# Patient Record
Sex: Female | Born: 1940
Health system: Southern US, Community
[De-identification: ages and names within clinical notes are randomized; demographics above are authoritative.]

## PROBLEM LIST (undated history)

## (undated) DIAGNOSIS — I82409 Acute embolism and thrombosis of unspecified deep veins of unspecified lower extremity: Secondary | ICD-10-CM

## (undated) DIAGNOSIS — Z6841 Body Mass Index (BMI) 40.0 and over, adult: Secondary | ICD-10-CM

## (undated) DIAGNOSIS — K219 Gastro-esophageal reflux disease without esophagitis: Secondary | ICD-10-CM

## (undated) DIAGNOSIS — I2119 ST elevation (STEMI) myocardial infarction involving other coronary artery of inferior wall: Secondary | ICD-10-CM

## (undated) DIAGNOSIS — D649 Anemia, unspecified: Secondary | ICD-10-CM

## (undated) DIAGNOSIS — R7303 Prediabetes: Secondary | ICD-10-CM

## (undated) DIAGNOSIS — I440 Atrioventricular block, first degree: Secondary | ICD-10-CM

## (undated) DIAGNOSIS — E669 Obesity, unspecified: Secondary | ICD-10-CM

## (undated) DIAGNOSIS — R42 Dizziness and giddiness: Secondary | ICD-10-CM

## (undated) DIAGNOSIS — R55 Syncope and collapse: Secondary | ICD-10-CM

## (undated) DIAGNOSIS — Z23 Encounter for immunization: Secondary | ICD-10-CM

## (undated) DIAGNOSIS — R0789 Other chest pain: Secondary | ICD-10-CM

## (undated) DIAGNOSIS — G4733 Obstructive sleep apnea (adult) (pediatric): Secondary | ICD-10-CM

## (undated) DIAGNOSIS — R413 Other amnesia: Secondary | ICD-10-CM

## (undated) DIAGNOSIS — N390 Urinary tract infection, site not specified: Secondary | ICD-10-CM

## (undated) DIAGNOSIS — K922 Gastrointestinal hemorrhage, unspecified: Secondary | ICD-10-CM

## (undated) DIAGNOSIS — G4700 Insomnia, unspecified: Secondary | ICD-10-CM

## (undated) DIAGNOSIS — R911 Solitary pulmonary nodule: Secondary | ICD-10-CM

## (undated) DIAGNOSIS — R9389 Abnormal findings on diagnostic imaging of other specified body structures: Secondary | ICD-10-CM

## (undated) DIAGNOSIS — E785 Hyperlipidemia, unspecified: Secondary | ICD-10-CM

## (undated) DIAGNOSIS — R079 Chest pain, unspecified: Secondary | ICD-10-CM

## (undated) DIAGNOSIS — I453 Trifascicular block: Secondary | ICD-10-CM

## (undated) DIAGNOSIS — I2510 Atherosclerotic heart disease of native coronary artery without angina pectoris: Secondary | ICD-10-CM

## (undated) DIAGNOSIS — K259 Gastric ulcer, unspecified as acute or chronic, without hemorrhage or perforation: Secondary | ICD-10-CM

## (undated) DIAGNOSIS — C541 Malignant neoplasm of endometrium: Secondary | ICD-10-CM

## (undated) DIAGNOSIS — I639 Cerebral infarction, unspecified: Secondary | ICD-10-CM

## (undated) DIAGNOSIS — I77810 Thoracic aortic ectasia: Secondary | ICD-10-CM

## (undated) DIAGNOSIS — K649 Unspecified hemorrhoids: Secondary | ICD-10-CM

## (undated) DIAGNOSIS — IMO0002 Reserved for concepts with insufficient information to code with codable children: Secondary | ICD-10-CM

## (undated) DIAGNOSIS — G459 Transient cerebral ischemic attack, unspecified: Secondary | ICD-10-CM

## (undated) DIAGNOSIS — I10 Essential (primary) hypertension: Secondary | ICD-10-CM

## (undated) DIAGNOSIS — Z5181 Encounter for therapeutic drug level monitoring: Secondary | ICD-10-CM

## (undated) DIAGNOSIS — Z8673 Personal history of transient ischemic attack (TIA), and cerebral infarction without residual deficits: Secondary | ICD-10-CM

## (undated) DIAGNOSIS — R739 Hyperglycemia, unspecified: Secondary | ICD-10-CM

## (undated) HISTORY — DX: Other chest pain: R07.89

## (undated) HISTORY — DX: Reserved for concepts with insufficient information to code with codable children: IMO0002

## (undated) HISTORY — DX: Morbid (severe) obesity due to excess calories: E66.01

## (undated) HISTORY — DX: Unspecified hemorrhoids: K64.9

## (undated) HISTORY — DX: Thoracic aortic ectasia: I77.810

## (undated) HISTORY — DX: Other amnesia: R41.3

## (undated) HISTORY — DX: Encounter for therapeutic drug level monitoring: Z51.81

## (undated) HISTORY — DX: Insomnia, unspecified: G47.00

## (undated) HISTORY — DX: Solitary pulmonary nodule: R91.1

## (undated) HISTORY — DX: Hyperglycemia, unspecified: R73.9

## (undated) HISTORY — DX: Gastrointestinal hemorrhage, unspecified: K92.2

## (undated) HISTORY — DX: Gastro-esophageal reflux disease without esophagitis: K21.9

## (undated) HISTORY — DX: Dizziness and giddiness: R42

## (undated) HISTORY — DX: Body Mass Index (BMI) 40.0 and over, adult: Z684

## (undated) HISTORY — DX: Obstructive sleep apnea (adult) (pediatric): G47.33

## (undated) HISTORY — DX: Transient cerebral ischemic attack, unspecified: G45.9

## (undated) HISTORY — PX: TUBAL LIGATION: SHX77

## (undated) HISTORY — DX: Trifascicular block: I45.3

## (undated) HISTORY — PX: ROTATOR CUFF REPAIR: SHX139

## (undated) HISTORY — DX: Urinary tract infection, site not specified: N39.0

## (undated) HISTORY — DX: Encounter for immunization: Z23

## (undated) HISTORY — DX: Malignant neoplasm of endometrium: C54.1

## (undated) HISTORY — DX: Acute embolism and thrombosis of unspecified deep veins of unspecified lower extremity: I82.409

## (undated) HISTORY — DX: Hyperlipidemia, unspecified: E78.5

## (undated) HISTORY — DX: Essential (primary) hypertension: I10

## (undated) HISTORY — DX: Gastric ulcer, unspecified as acute or chronic, without hemorrhage or perforation: K25.9

## (undated) HISTORY — DX: Atrioventricular block, first degree: I44.0

## (undated) HISTORY — DX: ST elevation (STEMI) myocardial infarction involving other coronary artery of inferior wall: I21.19

## (undated) HISTORY — DX: Cerebral infarction, unspecified: I63.9

## (undated) HISTORY — DX: Personal history of transient ischemic attack (TIA), and cerebral infarction without residual deficits: Z86.73

## (undated) HISTORY — DX: Abnormal findings on diagnostic imaging of other specified body structures: R93.89

## (undated) HISTORY — DX: Chest pain, unspecified: R07.9

## (undated) HISTORY — DX: Atherosclerotic heart disease of native coronary artery without angina pectoris: I25.10

## (undated) HISTORY — DX: Prediabetes: R73.03

---
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
## 1994-04-05 HISTORY — PX: KNEE SURGERY: SHX244

## 1999-01-27 ENCOUNTER — Emergency Department (HOSPITAL_COMMUNITY): Admission: EM | Admit: 1999-01-27 | Discharge: 1999-01-27 | Payer: Self-pay | Admitting: Emergency Medicine

## 2001-04-05 DIAGNOSIS — I82409 Acute embolism and thrombosis of unspecified deep veins of unspecified lower extremity: Secondary | ICD-10-CM

## 2001-04-05 HISTORY — DX: Acute embolism and thrombosis of unspecified deep veins of unspecified lower extremity: I82.409

## 2001-10-04 ENCOUNTER — Encounter: Payer: Self-pay | Admitting: Internal Medicine

## 2001-10-17 ENCOUNTER — Encounter: Payer: Self-pay | Admitting: Internal Medicine

## 2003-11-04 DIAGNOSIS — I2510 Atherosclerotic heart disease of native coronary artery without angina pectoris: Secondary | ICD-10-CM

## 2003-11-04 HISTORY — DX: Atherosclerotic heart disease of native coronary artery without angina pectoris: I25.10

## 2003-11-13 ENCOUNTER — Inpatient Hospital Stay (HOSPITAL_COMMUNITY): Admission: EM | Admit: 2003-11-13 | Discharge: 2003-11-15 | Payer: Self-pay | Admitting: Emergency Medicine

## 2003-11-25 ENCOUNTER — Ambulatory Visit (HOSPITAL_BASED_OUTPATIENT_CLINIC_OR_DEPARTMENT_OTHER): Admission: RE | Admit: 2003-11-25 | Discharge: 2003-11-25 | Payer: Self-pay | Admitting: Orthopedic Surgery

## 2003-11-25 ENCOUNTER — Ambulatory Visit (HOSPITAL_COMMUNITY): Admission: RE | Admit: 2003-11-25 | Discharge: 2003-11-25 | Payer: Self-pay | Admitting: Orthopedic Surgery

## 2003-12-03 ENCOUNTER — Encounter: Admission: RE | Admit: 2003-12-03 | Discharge: 2003-12-03 | Payer: Self-pay | Admitting: Orthopedic Surgery

## 2004-08-24 ENCOUNTER — Ambulatory Visit (HOSPITAL_COMMUNITY): Admission: RE | Admit: 2004-08-24 | Discharge: 2004-08-24 | Payer: Self-pay | Admitting: Family Medicine

## 2006-03-03 ENCOUNTER — Ambulatory Visit (HOSPITAL_COMMUNITY): Admission: RE | Admit: 2006-03-03 | Discharge: 2006-03-03 | Payer: Self-pay | Admitting: Family Medicine

## 2006-03-03 ENCOUNTER — Encounter (INDEPENDENT_AMBULATORY_CARE_PROVIDER_SITE_OTHER): Payer: Self-pay | Admitting: *Deleted

## 2006-04-18 ENCOUNTER — Encounter: Payer: Self-pay | Admitting: Internal Medicine

## 2006-04-18 ENCOUNTER — Ambulatory Visit: Payer: Self-pay

## 2007-01-29 ENCOUNTER — Emergency Department (HOSPITAL_COMMUNITY): Admission: EM | Admit: 2007-01-29 | Discharge: 2007-01-29 | Payer: Self-pay | Admitting: Family Medicine

## 2007-10-27 ENCOUNTER — Telehealth: Payer: Self-pay | Admitting: Internal Medicine

## 2007-10-27 ENCOUNTER — Ambulatory Visit: Payer: Self-pay | Admitting: Internal Medicine

## 2007-10-27 DIAGNOSIS — K625 Hemorrhage of anus and rectum: Secondary | ICD-10-CM | POA: Insufficient documentation

## 2007-11-01 ENCOUNTER — Ambulatory Visit: Payer: Self-pay | Admitting: Internal Medicine

## 2007-11-01 ENCOUNTER — Encounter: Payer: Self-pay | Admitting: Internal Medicine

## 2007-11-01 HISTORY — PX: COLONOSCOPY W/ POLYPECTOMY: SHX1380

## 2007-11-02 ENCOUNTER — Ambulatory Visit: Payer: Self-pay | Admitting: Internal Medicine

## 2007-11-02 LAB — CONVERTED CEMR LAB: BUN: 11 mg/dL (ref 6–23)

## 2007-11-03 ENCOUNTER — Ambulatory Visit: Payer: Self-pay | Admitting: Cardiovascular Disease

## 2007-11-09 ENCOUNTER — Ambulatory Visit (HOSPITAL_COMMUNITY): Admission: RE | Admit: 2007-11-09 | Discharge: 2007-11-09 | Payer: Self-pay | Admitting: Internal Medicine

## 2007-11-10 DIAGNOSIS — N289 Disorder of kidney and ureter, unspecified: Secondary | ICD-10-CM | POA: Insufficient documentation

## 2007-11-14 ENCOUNTER — Telehealth: Payer: Self-pay | Admitting: Internal Medicine

## 2007-11-16 ENCOUNTER — Encounter: Payer: Self-pay | Admitting: Internal Medicine

## 2008-06-05 ENCOUNTER — Encounter: Payer: Self-pay | Admitting: Internal Medicine

## 2008-07-15 ENCOUNTER — Emergency Department (HOSPITAL_COMMUNITY): Admission: EM | Admit: 2008-07-15 | Discharge: 2008-07-15 | Payer: Self-pay | Admitting: Family Medicine

## 2009-06-30 ENCOUNTER — Encounter (INDEPENDENT_AMBULATORY_CARE_PROVIDER_SITE_OTHER): Payer: Self-pay | Admitting: Obstetrics and Gynecology

## 2009-06-30 ENCOUNTER — Ambulatory Visit (HOSPITAL_COMMUNITY): Admission: RE | Admit: 2009-06-30 | Discharge: 2009-06-30 | Payer: Self-pay | Admitting: Obstetrics and Gynecology

## 2009-06-30 ENCOUNTER — Ambulatory Visit: Payer: Self-pay | Admitting: Vascular Surgery

## 2009-07-04 DIAGNOSIS — R9389 Abnormal findings on diagnostic imaging of other specified body structures: Secondary | ICD-10-CM | POA: Insufficient documentation

## 2009-07-04 HISTORY — DX: Abnormal findings on diagnostic imaging of other specified body structures: R93.89

## 2009-07-17 ENCOUNTER — Ambulatory Visit (HOSPITAL_COMMUNITY): Admission: RE | Admit: 2009-07-17 | Discharge: 2009-07-17 | Payer: Self-pay | Admitting: Obstetrics and Gynecology

## 2010-06-24 LAB — PROTIME-INR
INR: 1.01 (ref 0.00–1.49)
Prothrombin Time: 13.2 seconds (ref 11.6–15.2)

## 2010-06-24 LAB — URINALYSIS, ROUTINE W REFLEX MICROSCOPIC
Glucose, UA: NEGATIVE mg/dL
Ketones, ur: NEGATIVE mg/dL
Nitrite: NEGATIVE
pH: 6 (ref 5.0–8.0)

## 2010-06-24 LAB — CBC
Hemoglobin: 12.8 g/dL (ref 12.0–15.0)
MCHC: 33.1 g/dL (ref 30.0–36.0)
Platelets: 179 10*3/uL (ref 150–400)
RDW: 14 % (ref 11.5–15.5)
WBC: 8.5 10*3/uL (ref 4.0–10.5)

## 2010-06-24 LAB — BASIC METABOLIC PANEL
Calcium: 8.8 mg/dL (ref 8.4–10.5)
Creatinine, Ser: 0.66 mg/dL (ref 0.4–1.2)
GFR calc non Af Amer: >60 mL/min (ref >60)
Glucose, Bld: 107 mg/dL — ABNORMAL HIGH (ref 70–99)

## 2010-07-07 ENCOUNTER — Ambulatory Visit (INDEPENDENT_AMBULATORY_CARE_PROVIDER_SITE_OTHER): Payer: BC Managed Care – PPO | Admitting: Internal Medicine

## 2010-07-07 ENCOUNTER — Encounter: Payer: Self-pay | Admitting: Internal Medicine

## 2010-07-07 VITALS — BP 126/76 | HR 86 | Ht 64.0 in | Wt 297.2 lb

## 2010-07-07 DIAGNOSIS — K219 Gastro-esophageal reflux disease without esophagitis: Secondary | ICD-10-CM

## 2010-07-07 DIAGNOSIS — R1314 Dysphagia, pharyngoesophageal phase: Secondary | ICD-10-CM

## 2010-07-07 DIAGNOSIS — R1310 Dysphagia, unspecified: Secondary | ICD-10-CM

## 2010-07-07 MED ORDER — ESOMEPRAZOLE MAGNESIUM 40 MG PO CPDR: 40.0000 mg | DELAYED_RELEASE_CAPSULE | Freq: Every day | ORAL | Status: DC

## 2010-07-07 NOTE — Assessment & Plan Note (Signed)
She has chronic problems with heartburn and regurgitation as well as some intermittent dysphagia. This seems most likely to be an acid reflux problem. She stopped Nexium because she was afraid of the potential side effects but is having inadequate response to omeprazole 20 mg twice a day most days. Obesity and excessive caffeine in the form of coffee or her main risk factors that I can see at this point.  Given the dysphagia in these chronic symptoms I have recommended and she agrees to undergo an upper GI endoscopy with possible esophageal dilation. Risks benefits and indications were explained.  Will start Nexium 40 mg before supper at this point, and discontinue the omeprazole. She still has her gallbladder but the symptoms really do sound more like reflux to me. Await the EGD and the retry love Nexium which he says was better.

## 2010-07-07 NOTE — Patient Instructions (Addendum)
Gastroesophageal Reflux Disease (GERD) Your caregiver has diagnosed your chest discomfort as caused by gastroesophageal reflux disease (GERD). GERD is caused by a reflux of acid from your stomach into the digestive tube between your mouth and stomach (esophagus). Acid in contact with the esophagus causes soreness (inflammation) resulting in heartburn or chest pain. It may cause small holes in the lining of the esophagus (ulcers). CAUSES  Increased body weight puts pressure on the stomach, making acid rise.   Smoking increases acid production.   Alcohol decreases pressure on the valve between the stomach and esophagus (lower esophageal sphincter), allowing acid from the stomach into the esophagus.   Late evening meals and a full stomach increase pressure and acid production.   Lower esophageal sphincter is malformed.   Sometimes, no reason is found.  HOME CARE INSTRUCTIONS  Change the factors that you can control. Weight, smoking, or alcohol changes may be difficult to change on your own. Your caregiver can provide guidance and medical therapy.   Raising the head of your bed may help you to sleep.   Over-the-counter medicines will decrease acid production. Your caregiver can also prescribe medicines for this. Only take over-the-counter or prescription medicines for pain, discomfort, or fever as directed by your caregiver.   1/2 to 1 teaspoon of an antacid taken every hour while awake, with meals, and at bedtime, can neutralize acid.   DO NOT take aspirin, ibuprofen, or other nonsteroidal anti-inflammatory drugs (NSAIDs).  SEEK IMMEDIATE MEDICAL CARE IF:  The pain changes in location (radiates into arms, neck, jaw, teeth, or back), intensity, or duration.   You start feeling sick to your stomach (nauseous), start throwing up (vomiting), or sweating (diaphoresis).   You develop left arm or jaw pain.   You develop pain going into your back, shortness of breath, or you pass out.    There is vomiting of fluid that is green, yellow, or looks like coffee grounds or blood.  These symptoms could signal other problems, such as heart disease. MAKE SURE YOU:  Understand these instructions.   Will watch your condition.   Will get help right away if you are not doing well or get worse.  Document Released: 12/30/2004 Document Re-Released: 06/16/2009 [facility] Patient Information 2011 [place], [place].  NEXIUM IS A NEW MEDICATION AND PRESCRIPTION SENT TO YOUR PHARMACY - PLEASE PICK THAT UP AND START ITREDUCE COFFEE AND OTHER CAFFEINE KEEP TRYING TO LOSE WEIGHT Your EGD is scheduled on 08/04/2010 at 4pm

## 2010-07-07 NOTE — Progress Notes (Signed)
  Subjective:    Patient ID: Shirley [name], female    DOB: 09-Jan-1941, 70 y.o.   MRN: [id number]  Gastrophageal Reflux She complains of abdominal pain, chest pain, choking, dysphagia and heartburn. solid dysphagia but also with liquids at times, intermittent, last happened last week. Suprasternal  sticking point.  On Prilosec with help, and also has used Nexum before that. these have been prescribed by PCP.[name] This is a chronic problem. The current episode started more than 1 year ago. The problem occurs frequently. The problem has been waxing and waning. The heartburn duration is more than one hour. The heartburn is located in the abdomen and substernum. The heartburn is of severe intensity.  avoids hot foods. Does use lemonade, drinks 3-4 cups of coffee a day. More nocturnal symptoms. Will sit up or use pillows. Last meal is at 5-6 PM and to bed at 11 PM. Still working in schools as Geologist, engineering.    Review of Systems  Constitutional: Negative for fever and appetite change.  Respiratory: Positive for choking and shortness of breath.        Mild dyspnea on exertion  Cardiovascular: Positive for chest pain.  Gastrointestinal: Positive for heartburn, dysphagia and abdominal pain.  Genitourinary: Negative for difficulty urinating.  Neurological: Negative for headaches.       Objective:   Physical Exam  Constitutional: She is oriented to person, place, and time. She appears well-developed and well-nourished.       Obese  HENT:  Mouth/Throat: No oropharyngeal exudate.  Eyes: Conjunctivae are normal.  Cardiovascular: Normal rate, regular rhythm and normal heart sounds.   Pulmonary/Chest: Effort normal and breath sounds normal. She has no wheezes.  Abdominal:       Obese soft and nontender without organomegaly mass Bowel sounds present and normal  Neurological: She is alert and oriented to person, place, and time.  Psychiatric: She has a normal mood and affect.           Assessment & Plan:

## 2010-07-08 ENCOUNTER — Encounter: Payer: Self-pay | Admitting: Internal Medicine

## 2010-07-15 LAB — HEMOGLOBIN AND HEMATOCRIT, BLOOD: HCT: 41.2 % (ref 36.0–46.0)

## 2010-08-03 ENCOUNTER — Encounter: Payer: Self-pay | Admitting: Internal Medicine

## 2010-08-04 ENCOUNTER — Ambulatory Visit (AMBULATORY_SURGERY_CENTER): Payer: BC Managed Care – PPO | Admitting: Internal Medicine

## 2010-08-04 ENCOUNTER — Encounter: Payer: Self-pay | Admitting: Internal Medicine

## 2010-08-04 VITALS — BP 160/59 | HR 72 | Temp 97.1°F | Resp 22 | Ht 64.0 in | Wt 295.0 lb

## 2010-08-04 DIAGNOSIS — K449 Diaphragmatic hernia without obstruction or gangrene: Secondary | ICD-10-CM

## 2010-08-04 DIAGNOSIS — R1314 Dysphagia, pharyngoesophageal phase: Secondary | ICD-10-CM

## 2010-08-04 DIAGNOSIS — R1310 Dysphagia, unspecified: Secondary | ICD-10-CM

## 2010-08-04 DIAGNOSIS — K219 Gastro-esophageal reflux disease without esophagitis: Secondary | ICD-10-CM

## 2010-08-04 MED ORDER — SODIUM CHLORIDE 0.9 % IV SOLN: 500.0000 mL | INTRAVENOUS | Status: DC

## 2010-08-04 NOTE — Patient Instructions (Addendum)
Clear liquids only until 4 PM. Then soft foods only.Tomorrow 5/2 may try normal consistency foods.     Gastroesophageal Reflux Disease (GERD) Your caregiver has diagnosed your chest discomfort as caused by gastroesophageal reflux disease (GERD). GERD is caused by a reflux of acid from your stomach into the digestive tube between your mouth and stomach (esophagus). Acid in contact with the esophagus causes soreness (inflammation) resulting in heartburn or chest pain. It may cause small holes in the lining of the esophagus (ulcers). CAUSES  Increased body weight puts pressure on the stomach, making acid rise.   Smoking increases acid production.   Alcohol decreases pressure on the valve between the stomach and esophagus (lower esophageal sphincter), allowing acid from the stomach into the esophagus.   Late evening meals and a full stomach increase pressure and acid production.   Lower esophageal sphincter is malformed.   Sometimes, no reason is found.  HOME CARE INSTRUCTIONS  Change the factors that you can control. Weight, smoking, or alcohol changes may be difficult to change on your own. Your caregiver can provide guidance and medical therapy.   Raising the head of your bed may help you to sleep.   Over-the-counter medicines will decrease acid production. Your caregiver can also prescribe medicines for this. Only take over-the-counter or prescription medicines for pain, discomfort, or fever as directed by your caregiver.   1/2 to 1 teaspoon of an antacid taken every hour while awake, with meals, and at bedtime, can neutralize acid.   DO NOT take aspirin, ibuprofen, or other nonsteroidal anti-inflammatory drugs (NSAIDs).  SEEK IMMEDIATE MEDICAL CARE IF:  The pain changes in location (radiates into arms, neck, jaw, teeth, or back), intensity, or duration.   You start feeling sick to your stomach (nauseous), start throwing up (vomiting), or sweating (diaphoresis).   You develop left  arm or jaw pain.   You develop pain going into your back, shortness of breath, or you pass out.   There is vomiting of fluid that is green, yellow, or looks like coffee grounds or blood.  These symptoms could signal other problems, such as heart disease. MAKE SURE YOU:  Understand these instructions.   Will watch your condition.   Will get help right away if you are not doing well or get worse.  Document Released: 12/30/2004 Document Re-Released: 06/16/2009 [facility] Patient Information 2011 [place], [place]. consistency foods. Please try to lose some weight and continue your Nexium. Discharged instructions given with verbal understanding.

## 2010-08-05 ENCOUNTER — Telehealth: Payer: Self-pay | Admitting: *Deleted

## 2010-08-05 NOTE — Telephone Encounter (Signed)

## 2010-08-13 ENCOUNTER — Other Ambulatory Visit: Payer: Self-pay | Admitting: Internal Medicine

## 2010-08-13 NOTE — Telephone Encounter (Signed)
Not sure what RX patient is looking for not on ph note but per EGD notes and medication list patient was given Nexium and no other medication on file.  Called [facility] on [address] In [place] and they do have Nexium RX under Roland Earl with same DOB.  Called patient and left message for call back to office because Nexium RX is there unless she is looking for another RX.

## 2010-08-21 NOTE — Cardiovascular Report (Signed)
NAME:  Shirley [name], Shirley [name]                          ACCOUNT NO.:  [id number]   MEDICAL RECORD NO.:  [id number]                   PATIENT TYPE:  INP   LOCATION:  3308                                 FACILITY:  [facility]   PHYSICIAN:  Learta Codding, M.D. [facility]             DATE OF BIRTH:  1940/05/07   DATE OF PROCEDURE:  11/14/2003  DATE OF DISCHARGE:  11/15/2003                              CARDIAC CATHETERIZATION   PROCEDURE PERFORMED:  1. Left heart catheterization.  Selective coronary angiography.  2. Ventriculography.   CARDIOLOGIST PERFORMING CATHETERIZATION:  Learta Codding, M.D.   CARDIOLOGIST:  Charlton Haws, M.D.   PRIMARY CARE PHYSICIAN:  __________ Ian Malkin, M.D., [facility].   DIAGNOSES:  Mild nonobstructive coronary artery disease.   INDICATIONS:  The patient is a 70 year old female with no prior history of  coronary artery disease.  The patient presents with prolonged substernal  chest pain.  She has ruled out for myocardial infarction.  She presents for  cardiac catheterization to assess her coronary anatomy.   DESCRIPTION OF PROCEDURE:  After informed consent was obtained, the patient  was brought to the catheterization laboratory.  A 5 [place] and JR4  catheter was used for coronary angiography after a 5 French arterial sheath  was placed.  Coronary angiography was performed in various projections using  manual injection of contrast.  Ventriculography was performed with a 5  French angled pigtail catheter.  No complications were encountered.  At the  termination of the procedure, all catheter sheaths were removed, and the  patient was brought back to the holding area.   FINDINGS:  HEMODYNAMICS:  1. Left ventricular pressure 156/15 mmHg.  2. Arterial pressure 156/81 mmHg.  3. Ventriculography:  Ejection fraction 70% without any wall motion     abnormalities.  No mitral regurgitation.   SELECTIVE CORONARY ANGIOGRAPHY:  1. The left main coronary artery is a large  caliber vessel with no flow-     limiting lesion.  2. The left anterior descending artery was a large caliber vessel with a     diffuse stenosis of approximately 30% in the mid vessel.  No other flow-     limiting lesions were seen in the diagonal branches.  3. The circumflex coronary artery was a large caliber vessel with no flow-     limiting lesions.  4. The ramus was a moderate size vessel with no flow-limiting lesion.  5. The right coronary artery was a large caliber vessel with diffuse     stenosis of 30% in the proximal segment.  The PDA and posterior lateral     branch were free of flow-limiting lesions.   RECOMMENDATIONS:  There is no evidence of significant epicardial coronary  artery disease.  The patient was significantly hypertensive during the  procedure and required hydralazine.  She likely has hypertensive heart  disease.  She will need medical treatment for hypertension.  Workup for  pulmonary embolism might also be required if the patient continues to have  substernal chest pain.                                               Learta Codding, M.D. [facility]    GED/MEDQ  D:  11/14/2003  T:  11/15/2003  Job:  [id number]   cc:   Charlton Haws, M.D.   _______ Nada Boozer, M.D.  [facility]

## 2010-08-21 NOTE — Discharge Summary (Signed)
Shirley [name], Shirley [name] NO.:  [id number]   MEDICAL RECORD NO.:  [id number]          PATIENT TYPE:  INP   LOCATION:  3308                         FACILITY:  [facility]   PHYSICIAN:  Guy Franco, P.A. [facility]   DATE OF BIRTH:  December 03, 1940   DATE OF ADMISSION:  11/13/2003  DATE OF DISCHARGE:  11/15/2003                           DISCHARGE SUMMARY - REFERRING   HOSPITAL COURSE:  Ms. [name] is a 70 year old female who presented to the  hospital for evaluation following a 3-day history of substernal chest pain.  She states that the pain was an 8/10 the entire time.  The pain was no  better on the day of admission and began radiating up her neck accompanied  by shortness of breath and diaphoresis.  [facility] was seen at [facility] and  then transferred to [facility] for further workup.  She did undergo cardiac  catheterization and this revealed 30% lesions in her right coronary artery  and mid LAD lesion with a normal EF.  Dr. Andee Lineman felt she had hypertensive  heart disease and we needed to treat this aggressively.  She was eventually  discharged on November 15, 2003 in stable condition.  She was discharged to  home on the following medications:  HCTZ 12.5 mg a day, metoprolol 25 mg  b.i.d., Darvocet as needed for chest pain (felt to be noncardiac).  Activity  as tolerated.  No lifting over 10 pounds for one week.  Remain on a low fat,  low salt diet.  Clean over cath site with soap and water, no scrubbing.  She  may return to work the following Monday.  She is to call Dr. Charlies Constable  for any further questions or concerns and she is to follow up with [facility] within 1 to 2 weeks to manage her blood pressure more aggressively.   Studies during her hospital stay revealed an EKG which revealed a normal  sinus rhythm, no specific abnormalities.  Chest x-ray reveals cardiomegaly  with mild pulmonary vascular congestion.  White count 10.1, hemoglobin 12.7,  hematocrit 37.4, platelets 221.   Sodium 138, potassium 3.8, BUN 7,  creatinine 0.7.  Total cholesterol 176, triglycerides 79, HDL 45, LDL 115.  Cardiac isoenzymes and troponin's were negative.   The patient is discharged to home in stable condition.       LB/MEDQ  D:  01/06/2004  T:  01/06/2004  Job:  [id number]

## 2010-08-21 NOTE — H&P (Signed)
NAME:  Shirley [name], Shirley [name]                          ACCOUNT NO.:  [id number]   MEDICAL RECORD NO.:  [id number]                   PATIENT TYPE:  INP   LOCATION:  1826                                 FACILITY:  [facility]   PHYSICIAN:  Charlies Constable, M.D. [facility]              DATE OF BIRTH:  Oct 30, 1940   DATE OF ADMISSION:  11/13/2003  DATE OF DISCHARGE:                                HISTORY & PHYSICAL   CHIEF COMPLAINT:  Chest pain.   CLINICAL HISTORY:  The patient is a 70 year old woman and has had no prior  history of known heart disease.  Over the last three days, she has developed  chest pain which she describes as a substernal aching pain with some  radiation to her left neck and left arm.  The pain has waxed and waned but  has not had much relation to activity.  She says it might be somewhat worse  when she takes a deep breath.  She has had some shortness of breath but no  nausea and no diaphoresis.  The pain persisted and got somewhat worse so she  went to [facility] tonight and was seen by Dr. Everlene Other.  Her EKG  showed nonspecific ST-T changes and she had continued pain so he referred  her to [facility] emergency room  for [place] to evaluate.  She was evaluated  more than a year ago in our office for chest pain and had a Cardiolite scan  which was reportedly negative.   PAST MEDICAL HISTORY:  Significant for hypertension, previous rotator cuff  surgery, and tubal ligation.   ALLERGIES:  She has no known allergies.   CURRENT MEDICATIONS:  Hydrochlorothiazide and aspirin.   SOCIAL HISTORY:  She works as a Geologist, engineering at [employer].  She has six children.  She does not smoke.   FAMILY HISTORY:  She has two brothers who had myocardial infarctions in  their 59s.   For details of social history, family history, and review of systems, please  see Larita Fife Brown's complete note.   PHYSICAL EXAMINATION:  VITAL SIGNS:  Blood pressure 147/119, pulse 83 and regular.  NECK:   There was no venous distention.  The carotid pulses were full without  bruits.  CHEST:  Clear without rales or rhonchi.  HEART:  Rhythm is regular.  The first and second heart sounds were normal.  There is grade 1/6 systolic murmur at the left sternal edge but I could hear  no diastolic murmur.  ABDOMEN:  Protuberant.  Bowel sounds normal.  The abdomen was soft.  There  is no hepatosplenomegaly.  EXTREMITIES:  Peripheral pulses full and there was trace peripheral edema.  MUSCULOSKELETAL:  No deformities.  SKIN:  Warm and dry.  NEUROLOGICAL:  No focal neurological signs.   EKG showed sinus rhythm with borderline left axis deviation and T wave  inversion in 3 and biphasic T wave  in AVF.  There were minor nonspecific ST-  T changes.   IMPRESSION:  1. Chest pain with nonspecific ST-T changes on EKG, possible ischemia.  2. Hypertension.  3. Positive family history for coronary heart disease.   RECOMMENDATIONS:  Will plan to admit the patient to the hospital for  observation.  Will start beta blockers for blood pressure and possible  ischemia, will start heparin and aspirin.  I think the best way to evaluate  her would be with coronary angiography.  I think noninvasive testing will be  difficult to interpret in view of her weight and she has had recurrence of  chest pain on multiple occasions so I think a definitive diagnosis would be  helpful.                                                Charlies Constable, M.D. [facility]    BB/MEDQ  D:  11/13/2003  T:  11/13/2003  Job:  [id number]   cc:   Delano Metz, M.D.  [address]  [place]  [place] [address]  Fax: [phone]   Tracey Harries, M.D.  [address]  [place]  [place] [address]  Fax: [phone]   Charlton Haws, M.D.

## 2010-08-21 NOTE — Op Note (Signed)
NAME:  DESTINAE, NEUBECKER                          ACCOUNT NO.:  [id number]   MEDICAL RECORD NO.:  [id number]                   PATIENT TYPE:  AMB   LOCATION:  DSC                                  FACILITY:  [facility]   PHYSICIAN:  Feliberto Gottron. Turner Daniels, M.D.                DATE OF BIRTH:  1940-07-11   DATE OF PROCEDURE:  11/25/2003  DATE OF DISCHARGE:                                 OPERATIVE REPORT   PREOPERATIVE DIAGNOSES:  Left knee chondromalacia, possible loose body.   POSTOPERATIVE DIAGNOSES:  Left knee chondromalacia, possible loose body.  A  fairly large lateral loose body about a 1 cm chunk of cartilage.   OPERATION PERFORMED:  Left knee arthroscopic debridement of chondromalacia  from the lateral patella, grade 4, lateral femoral condyle and trochlea,  superiorly grade 4.  Medial femoral condyle grade 3, probable donor site of  the loose body.  Removal of the 1 cm x 8 mm x 3 mm loose body from the  lateral side of the knee hiding out underneath the lateral meniscus,  certainly a pain generator.  There were some smaller loose bodies in the  suprapatellar pouch as well that were removed.   SURGEON:  Feliberto Gottron. Turner Daniels, M.D.   ASSISTANTLaural Benes. Jannet Mantis.   ANESTHESIA:  General LMA.   ESTIMATED BLOOD LOSS:  Minimal.   FLUIDS REPLACED:  800 mL crystalloids.   DRAINS:  None.   TOURNIQUET TIME:  None.   INDICATIONS FOR PROCEDURE:  The patient is a 70 year old woman injured at  work with chronic catching, popping and pain in her left knee.  Arthritic  changes by x-ray and possible loose body or chondromalacia.  She has failed  conservative treatment over many months and now desires elective  arthroscopic evaluation and treatment of her left knee.   DESCRIPTION OF PROCEDURE:  The patient was identified by arm band and taken  to the operating room at [facility].  Appropriate  anesthetic monitors were attached.  General LMA anesthesia was induced with  the patient  in the supine position.  Lateral post applied to the table.  Left lower extremity prepped and draped in the usual sterile fashion from  the ankle to the midthigh.  Using a #11 blade, standard inferomedial,  inferolateral peripatellar portals were then made allowing introduction of  the arthroscope through the inferolateral portal and the outflow through the  inferomedial portal.  Diagnostic arthroscopy immediately revealed some  suprapatellar pouch loose bodies removed with a grasper and a 3.5 Gator  sucker shaver, grade 4 chondromalacia of the lateral facet of the patella  and the lateral side of the trochlea and superior flange of the femur and  these were also debrided back to stable margins.  Medial femoral condyle had  grade 3 chondromalacia over a 2 cm by 1 cm area over the weightbearing area  specifically and  this was debrided back to stable margins removing some flap  tears that were present.  The ACL and the PCL were intact, but there were  some notch osteophytes and moving to the lateral side, we identified a large  loose body underneath the lateral meniscus.  It was teased out with a hook  and removed with a grasper.  1 cm x 8 mm x 3 mm chunk of cartilage probably  from the medial side of the joint if I had to guess.  The gutters were  cleared of some smaller loose bodies.  The menisci were probed and  remarkably in good condition.  The knee was irrigated out with normal saline  solution.  The arthroscopic instruments were removed.  A dressing of  Xeroform, 4 x 4 dressing sponges, Webril and Ace wrap was applied.  The  patient was then unclamped, awakened and taken to the recovery room without  difficulty.                                               Feliberto Gottron. Turner Daniels, M.D.    Ovid Curd  D:  11/25/2003  T:  11/25/2003  Job:  [id number]

## 2010-09-13 ENCOUNTER — Inpatient Hospital Stay (HOSPITAL_COMMUNITY)
Admission: EM | Admit: 2010-09-13 | Discharge: 2010-09-15 | DRG: 832 | Disposition: A | Discharge status: Home Health | Payer: BC Managed Care – PPO | Source: Ambulatory Visit | Attending: Internal Medicine | Admitting: Internal Medicine

## 2010-09-13 ENCOUNTER — Emergency Department (HOSPITAL_COMMUNITY): Payer: BC Managed Care – PPO

## 2010-09-13 ENCOUNTER — Encounter: Payer: Self-pay | Admitting: Internal Medicine

## 2010-09-13 DIAGNOSIS — Z7982 Long term (current) use of aspirin: Secondary | ICD-10-CM

## 2010-09-13 DIAGNOSIS — K219 Gastro-esophageal reflux disease without esophagitis: Secondary | ICD-10-CM | POA: Diagnosis present

## 2010-09-13 DIAGNOSIS — G4733 Obstructive sleep apnea (adult) (pediatric): Secondary | ICD-10-CM | POA: Diagnosis present

## 2010-09-13 DIAGNOSIS — R209 Unspecified disturbances of skin sensation: Secondary | ICD-10-CM | POA: Diagnosis present

## 2010-09-13 DIAGNOSIS — I10 Essential (primary) hypertension: Secondary | ICD-10-CM | POA: Diagnosis present

## 2010-09-13 DIAGNOSIS — E785 Hyperlipidemia, unspecified: Secondary | ICD-10-CM | POA: Diagnosis present

## 2010-09-13 DIAGNOSIS — G459 Transient cerebral ischemic attack, unspecified: Principal | ICD-10-CM | POA: Diagnosis present

## 2010-09-13 DIAGNOSIS — Z86718 Personal history of other venous thrombosis and embolism: Secondary | ICD-10-CM

## 2010-09-13 DIAGNOSIS — R7309 Other abnormal glucose: Secondary | ICD-10-CM | POA: Diagnosis present

## 2010-09-13 LAB — DIFFERENTIAL
Basophils Relative: 1 % (ref 0–1)
Eosinophils Absolute: 0.4 10*3/uL (ref 0.0–0.7)
Eosinophils Relative: 4 % (ref 0–5)
Lymphs Abs: 2.5 10*3/uL (ref 0.7–4.0)
Monocytes Absolute: 0.7 10*3/uL (ref 0.1–1.0)
Monocytes Relative: 7 % (ref 3–12)

## 2010-09-13 LAB — COMPREHENSIVE METABOLIC PANEL
ALT: 16 U/L (ref 0–35)
CO2: 29 mEq/L (ref 19–32)
Calcium: 8.7 mg/dL (ref 8.4–10.5)
Chloride: 105 mEq/L (ref 96–112)
Creatinine, Ser: 0.64 mg/dL (ref 0.4–1.2)
GFR calc Af Amer: >60 mL/min (ref >60)
GFR calc non Af Amer: >60 mL/min (ref >60)
Glucose, Bld: 116 mg/dL — ABNORMAL HIGH (ref 70–99)
Total Bilirubin: 0.2 mg/dL — ABNORMAL LOW (ref 0.3–1.2)

## 2010-09-13 LAB — TSH: TSH: 2.069 u[IU]/mL (ref 0.350–4.500)

## 2010-09-13 LAB — COMPREHENSIVE METABOLIC PANEL WITH GFR
AST: 16 U/L (ref 0–37)
Albumin: 3 g/dL — ABNORMAL LOW (ref 3.5–5.2)
Alkaline Phosphatase: 109 U/L (ref 39–117)
BUN: 18 mg/dL (ref 6–23)
Potassium: 4.4 meq/L (ref 3.5–5.1)
Sodium: 141 meq/L (ref 135–145)
Total Protein: 6.5 g/dL (ref 6.0–8.3)

## 2010-09-13 LAB — CARDIAC PANEL(CRET KIN+CKTOT+MB+TROPI)
CK, MB: 2 ng/mL (ref 0.3–4.0)
Relative Index: INVALID (ref 0.0–2.5)
Relative Index: INVALID (ref 0.0–2.5)
Total CK: 65 U/L (ref 7–177)
Troponin I: <0.3 ng/mL (ref <0.30)

## 2010-09-13 LAB — HEMOGLOBIN A1C: Mean Plasma Glucose: 128 mg/dL — ABNORMAL HIGH (ref <117)

## 2010-09-13 LAB — CBC
MCH: 28.1 pg (ref 26.0–34.0)
MCHC: 33.4 g/dL (ref 30.0–36.0)
MCV: 84.2 fL (ref 78.0–100.0)
Platelets: 223 10*3/uL (ref 150–400)

## 2010-09-13 LAB — BASIC METABOLIC PANEL
Calcium: 8.8 mg/dL (ref 8.4–10.5)
Creatinine, Ser: 0.71 mg/dL (ref 0.4–1.2)
GFR calc Af Amer: >60 mL/min (ref >60)

## 2010-09-13 LAB — CK TOTAL AND CKMB (NOT AT ARMC)
CK, MB: 2.2 ng/mL (ref 0.3–4.0)
Relative Index: INVALID (ref 0.0–2.5)
Total CK: 71 U/L (ref 7–177)

## 2010-09-13 LAB — PROTIME-INR: Prothrombin Time: 13.4 seconds (ref 11.6–15.2)

## 2010-09-13 LAB — TROPONIN I: Troponin I: <0.3 ng/mL (ref <0.30)

## 2010-09-13 LAB — APTT: aPTT: 30 seconds (ref 24–37)

## 2010-09-13 NOTE — Progress Notes (Signed)
Hospital Admission Note Date: 09/13/2010  Patient name:  Shirley [name]  Medical record number:  [id number] Date of birth:  Nov 26, 1940  Age: 70 y.o. Gender:  female PCP:    None.  Medical Service:   Internal Medicine Teaching Service   Attending physician:  Dr. Margarito Liner First Contact:   Dr. Saralyn Pilar Pager: [phone]  Second Contact:   Dr. Scot Dock  Pager: [phone] After Hours:    First Contact  Pager: [phone]      Second Contact  Pager: [phone]   Chief Complaint: Left sided numbness  History of Present Illness: Patient is a 70 y.o. female with a PMHx of hypertension, nonobstructive coronary artery disease, morbid obesity, prior TIA in 2004 who presents for evaluation of left-sided numbness that began on the evening prior to admission. Patient first noticed numbness sensation of the left arm and leg yesterday evening, then experienced mild numbness sensation to left side of face. Patient attempted to sleep, thinking that symptoms may resolve overnight, perhaps attributed to fatigue. However, when she awoke on the morning of admission, facial numbness was persistent - which finally prompted patient's presentation to the ER. Since onset, symptoms are slowly improving. Symptoms were associated with palpitations and mild nausea, without vomiting. Patient has also had persistent occipital and left parietal headache, described as a 3/10 squeezing, nonradiating pain, without specific aggravating or alleviating factors. Patient did not experience visual changes, facial droop, difficulty swallowing, chest pain. Patient's daughter provided 325mg  Aspirin on the morning of admission. States that she is taking her medications regularly without missing doses. Surrounding her symptom, the patient checked her blood pressure at home and found it to be 200/110. However, typically the patient's blood pressures are well controlled in the 120s to 130s systolic.  Current Outpatient Medications: Medication Sig    . esomeprazole (NEXIUM) 40 MG capsule Take 1 capsule (40 mg total) by mouth daily. Take 30 minutes before supper  . lisinopril-hydrochlorothiazide 10-12.5 MG per tablet Take 1 tablet by mouth daily.     Allergies: Review of patient's allergies indicates no known allergies.  Past Medical History: Diagnosis Date  . Hypertension   . Hemorrhoid   . GERD (gastroesophageal reflux disease)   . Adenomatous polyps   . DVT (deep venous thrombosis) 2003    lower extremity DVT with questionable recurrence in 2005. Treated with 1.5 year course of coumadin.  [name] History of TIAs 2004  . Endometrial thickening on ultra sound 07/2009    path showing Fragments of benign endocervical and squamous mucosa. No dysplasia or malignancy // Followed by Dr. Miguel Aschoff  . Coronary artery disease, non-occlusive 11/2003    LHC (11/2003) - LAD diffuse 30% stenosis of mid-vessel. RCA with 30% stenosis of proximal vessel - by Dr. Andee Lineman. // 2D Echo (04/2006) -  LV EF 65%.  Mild LVH. No  wall motion abnormalities.  Mild diastolic dysfunction.     Past Surgical History: Procedure Date  . Tubal ligation   . Knee surgery 1996    Left knee arthroscopy.  . Colonoscopy w/ polypectomy 11/01/2007    8 mm rectal adenoma, hemorrhoids  . Rotator cuff repair     right.   Family History: Problem Relation  . Thyroid cancer Sister  . Liver cancer Sister  . Diabetes Mother  . Hyperlipidemia Mother  . Heart disease Mother  . Angina Father  . Heart disease Brother    x 2 in 14s yo    Social History: History   Social History  .  Marital Status: Married    Spouse Name: N/A    Number of Children: 6  . Years of Education: College   Occupational History  . Geologist, engineering   . TEACHER- [employer]   Social History Main Topics  . Smoking status: Never Smoker   . Smokeless tobacco: Never Used  . Alcohol Use: No  . Drug Use: No  . Sexually Active: Not on file   Other Topics Concern  .  Not on file   Social History Narrative   Full Code status.Insurance: BCBS    Review of Systems: Respiratory - denies cough, congestion, wheezing, shortness of breath. CVS - denies chest pain. Confirms palpitations. GI/GU - denies vomiting, abdominal pain, dysuria, hematuria, malodorous urine, sedation, diarrhea, blood in stools. Neurologic - confirms left-sided numbness and weakness, headache. Denies vision changes, lightheadedness, dizziness.   Vital Signs: T:  98.3  P:  74  BP: 192/92 then 123/50  RR:  18  O2 sat:  98% on room air     Physical Exam: General: Vital signs reviewed and noted. Well-developed, well-nourished, in no acute distress; alert, appropriate and cooperative throughout examination.  Head: Normocephalic, atraumatic.  Eyes: PERRL, EOMI, No signs of anemia or jaundince.  Nose: Mucous membranes moist, not inflammed, nonerythematous.  Throat: Oropharynx nonerythematous, no exudate appreciated.   Neck: No deformities, masses, or tenderness noted.Supple, No carotid Bruits, no JVD.  Lungs:  Normal respiratory effort. Clear to auscultation BL without crackles or wheezes.  Heart: RRR. S1 and S2 normal without gallop, murmur, or rubs.  Abdomen:  BS normoactive. Soft, Nondistended, non-tender.  No masses or organomegaly.  Extremities: No pretibial edema.  Neurologic: A&O X3, CN II - XII are grossly intact. Motor strength is 5/5 in the all 4 extremities. Diminished sensation of left face as compared to right.  Skin: No visible rashes, scars.    Lab results: CBC: WBC                                      10.0              4.0-10.5         K/uL RBC                                      4.55              3.87-5.11        MIL/uL Hemoglobin (HGB)                         12.8              12.0-15.0        g/dL Hematocrit (HCT)                         38.3              36.0-46.0        % MCV                                      84.2  78.0-100.0       fL MCH -                                     28.1              26.0-34.0        pg MCHC                                     33.4              30.0-36.0        g/dL RDW                                      13.7              11.5-15.5        % Platelet Count (PLT)                     223               150-400          K/uL Neutrophils, %                           64                43-77            % Lymphocytes, %                           25                12-46            % Monocytes, %                             7                 3-12             % Eosinophils, %                           4                 0-5              % Basophils, %                             1                 0-1              % Neutrophils, Absolute                    6.4               1.7-7.7          K/uL Lymphocytes, Absolute  2.5               0.7-4.0          K/uL Monocytes, Absolute                      0.7               0.1-1.0          K/uL Eosinophils, Absolute                    0.4               0.0-0.7          K/uL Basophils, Absolute                      0.1               0.0-0.1          K/uL  Basic Metabolic Panel: Sodium (NA)                              139               135-145          mEq/L Potassium (K)                            3.9               3.5-5.1          mEq/L Chloride                                 103               96-112           mEq/L CO2                                      26                19-32            mEq/L Glucose                                  107        h      70-99            mg/dL BUN                                      22                6-23             mg/dL Creatinine                               0.71              0.4-1.2          mg/dL GFR,  Est Non African American            >60               >60              mL/min Calcium                                  8.8               8.4-10.5         Mg/dL  Coagulation Studies: Protime ( Prothrombin Time)               13.4              11.6-15.2        seconds INR                                      1.00              0.00-1.49 PTT(a-Partial Thromboplastn Time)        30                24-37            seconds  Imaging results:  1) CT head without contrast - Cerebral atrophy.  No evidence of acute intracranial hemorrhage, mass lesion, or acute infarct.   Assessment & Plan: 1) Left-sided facial numbness - Onset of symptoms were greater than 12 hours ago, and are slowly improving since onset. Symptoms may represent acute stroke versus TIA in the setting of hypertension, morbid obesity, known CAD, prior TIA. In setting of concurrent headache, may represent migraine. No evident systemic infection with no leukocytosis, no fevers, no systemic symptoms described. Patient also does not have history of diabetes, thereby making hypoglycemia less likely. No evidence of acute renal failure per labs. CT head negative for masses, intracranial hemorrhage.  - Will admit her to telemetry for stroke/TIA workup - Will get FLP, HbA1C, TSH, UA, cardiac enzymes and check an EKG - Imaging- MRI/MRA brain, carotid dopplers, 2D echo - Initiate secondary prevention measure: aspirin 325 mg, crestor 40mg  , PT/OT/ST, permissive hypertension (140-160)  - Patient has passed swallow evaluation in the ED, so will give regular diet.   2) Palpitations - unclear etiology, likely in setting of +1. No evidence of volume depletion, EKG does not suggest new arrhythmias, prior history of only mild diastolic dysfunction. However, in setting of possible obstructive sleep apnea with morbid obesity, may have had progression of heart disease/ cardiomyopathy. Denies drug abuse, recent increase in caffeine use. No anemia, fevers. Patient has strong family history of thyroid dysfunction, with sister with thyroid cancer, although no personal history of thyroid dysfunction.  - As per number one.  - Check TSH. - Check 2-D echocardiogram.  3) HTN - patient on  Lisinopril-HCTZ at home. However, in setting of possible stroke, will allow for permissive hypertension acutely. - Stroke/ TIA workup as per #1.  - Hold home antihypertensives until stroke ruled out, then consider resume after > 24 hours from onset of symptoms.  4) DVT PPX - Lovenox     Johnette Abraham, D.O. (PGY1):  ____________________________________    Date/ Time:      ____________________________________     Bethel Born, M.D. (PGY2):   ____________________________________  Date/ Time:      ____________________________________     I have seen and examined the patient. I reviewed the resident/fellow note and agree with the findings and plan of care as documented. My additions and revisions are included.   Signature:  ____________________________________________     Internal Medicine Teaching Service Attending    Date:    ____________________________________________

## 2010-09-14 DIAGNOSIS — R5381 Other malaise: Secondary | ICD-10-CM

## 2010-09-14 DIAGNOSIS — R5383 Other fatigue: Secondary | ICD-10-CM

## 2010-09-14 LAB — CARDIAC PANEL(CRET KIN+CKTOT+MB+TROPI)
CK, MB: 1.7 ng/mL (ref 0.3–4.0)
Relative Index: INVALID (ref 0.0–2.5)
Total CK: 58 U/L (ref 7–177)
Troponin I: <0.3 ng/mL (ref <0.30)

## 2010-09-14 LAB — LIPID PANEL
HDL: 41 mg/dL (ref >39)
Total CHOL/HDL Ratio: 4 RATIO
Triglycerides: 88 mg/dL (ref <150)

## 2010-09-14 LAB — GLUCOSE, CAPILLARY
Glucose-Capillary: 107 mg/dL — ABNORMAL HIGH (ref 70–99)
Glucose-Capillary: 124 mg/dL — ABNORMAL HIGH (ref 70–99)

## 2010-09-15 ENCOUNTER — Encounter: Payer: Self-pay | Admitting: Internal Medicine

## 2010-09-15 LAB — BASIC METABOLIC PANEL
BUN: 14 mg/dL (ref 6–23)
CO2: 27 mEq/L (ref 19–32)
Chloride: 105 mEq/L (ref 96–112)
Glucose, Bld: 102 mg/dL — ABNORMAL HIGH (ref 70–99)
Potassium: 3.7 mEq/L (ref 3.5–5.1)
Sodium: 139 mEq/L (ref 135–145)

## 2010-09-15 LAB — GLUCOSE, CAPILLARY: Glucose-Capillary: 71 mg/dL (ref 70–99)

## 2010-09-15 LAB — CBC
HCT: 37.6 % (ref 36.0–46.0)
Hemoglobin: 12.2 g/dL (ref 12.0–15.0)
RBC: 4.41 MIL/uL (ref 3.87–5.11)
WBC: 8 10*3/uL (ref 4.0–10.5)

## 2010-09-15 NOTE — Progress Notes (Signed)
HOSPITAL DISCHARGE SUMMARY  DATE OF ADMISSION:        09/09/2010 DATE OF DISCHARGE:        09/15/2010   BRIEF HOSPITAL SUMMARY: Pt is a 70 y.o. female who  has a past medical history of HTN, nonobstructive CAD, prior TIA in 2004 and who presented to [facility] for evaluation of left face, arm, and leg numbness, which was determined to be secondary to a TIA. CT head and MRI brain were negative for evidence of acute bleed or acute stroke. Also, 2D-echo was normal (EF 55-60%), and carotid dopplers showed no significant extracranial stenosis. Pt was previously not on aspirin therapy at home, therefore, she was started on Aspirin, Crestor 5mg , and resumed on her home Lisinopril-HCTZ after 24 hour period surrounding onset of symptoms. She had resolution of her symptoms by time of discharge, but will be sent home with HHPT for general deconditioning. Pt also found to have prediabetes (A1c 6.1) and was counseled about dietary changes. Lastly, FLP showed very mild elevation of LDL to 107, was started on statin therapy in setting of TIA and known CAD. TSH wnl.    STUDIES PENDING AT TIME OF DISCHARGE: None.  OTHER FOLLOW-UP ISSUES: 1) LFTs - in 4-6 weeks to evaluate liver function after starting statin. 2) HTN - assess blood pressure and escalate therapy if indicated. 3) Sleep study - please order outpatient sleep study for evaluation for OSA as pt is morbidly obese, notes nightly snoring, daytime fatigue, occasional palpitations.    MEDICATIONS ON DISCHARGE: aspirin 81 MG EC tablet Take 81 mg by mouth daily.     rosuvastatin (CRESTOR) 5 MG tablet Take 5 mg by mouth daily.     esomeprazole (NEXIUM) 40 MG capsule Take 1 capsule (40 mg total) by mouth daily. Take 30 minutes before supper   lisinopril-hydrochlorothiazide (PRINZIDE,ZESTORETIC) 10-12.5 MG per tablet Take 1 tablet by mouth daily.

## 2010-09-23 ENCOUNTER — Ambulatory Visit (INDEPENDENT_AMBULATORY_CARE_PROVIDER_SITE_OTHER): Payer: BC Managed Care – PPO | Admitting: Internal Medicine

## 2010-09-23 ENCOUNTER — Encounter: Payer: Self-pay | Admitting: Internal Medicine

## 2010-09-23 DIAGNOSIS — R739 Hyperglycemia, unspecified: Secondary | ICD-10-CM | POA: Insufficient documentation

## 2010-09-23 DIAGNOSIS — E785 Hyperlipidemia, unspecified: Secondary | ICD-10-CM | POA: Insufficient documentation

## 2010-09-23 DIAGNOSIS — K219 Gastro-esophageal reflux disease without esophagitis: Secondary | ICD-10-CM

## 2010-09-23 DIAGNOSIS — R7309 Other abnormal glucose: Secondary | ICD-10-CM

## 2010-09-23 DIAGNOSIS — N898 Other specified noninflammatory disorders of vagina: Secondary | ICD-10-CM

## 2010-09-23 DIAGNOSIS — G4733 Obstructive sleep apnea (adult) (pediatric): Secondary | ICD-10-CM

## 2010-09-23 DIAGNOSIS — G459 Transient cerebral ischemic attack, unspecified: Secondary | ICD-10-CM

## 2010-09-23 DIAGNOSIS — I10 Essential (primary) hypertension: Secondary | ICD-10-CM

## 2010-09-23 DIAGNOSIS — N939 Abnormal uterine and vaginal bleeding, unspecified: Secondary | ICD-10-CM

## 2010-09-23 DIAGNOSIS — I82409 Acute embolism and thrombosis of unspecified deep veins of unspecified lower extremity: Secondary | ICD-10-CM | POA: Insufficient documentation

## 2010-09-23 HISTORY — DX: Obstructive sleep apnea (adult) (pediatric): G47.33

## 2010-09-23 HISTORY — DX: Transient cerebral ischemic attack, unspecified: G45.9

## 2010-09-23 MED ORDER — ACETAMINOPHEN 325 MG PO TABS: 325.0000 mg | ORAL_TABLET | Freq: Four times a day (QID) | ORAL | Status: AC | PRN

## 2010-09-23 NOTE — Progress Notes (Signed)
  Subjective:    Patient ID: Shirley [name], female    DOB: [birth year], 70 y.o.   MRN: [id number]  HPI This is a 70 year old female with past medical history significant for recent GI, hypertension, hyperlipidemia, GERD who presents here for hospital followup. The patient was evaluated on June 10 for TIA. CT, MRI/MRA with and no acute process, Carotid Dopplers negative for stenosis, 2-D echo  with normal LV function with an EF of 55-60%. Patient was started on Aspirin, Crestor and Lisinopril 0hydrochlorothiazide. 1. patient noted that she is still experiencing numbness on her right face and on the left side. Noted further that she has occasional headaches and vision changes which has been present in the hospitalization. Mentioned that the weakness has significantly improved. She has been working with PT /OT home which helped her.  #2 vaginal bleeding which has been chronic since years and has been evaluated by her GYN Dr. Tenny Craw.  #3 patient noted significant depressed mood. She is not interested what she was doing prior to hospitalization for TIA. She further mentioned trouble with sleep. It is mainly that she wakes up in the middle of the night and in the morning she does not feel rested. During the day she gets very sleepy. Don't remember noted that she is snoring during whenever sleeping.   Review of Systems  Constitutional: Positive for activity change and fatigue. Negative for fever and chills.  Eyes: Positive for visual disturbance.  Respiratory: Negative for chest tightness, shortness of breath and wheezing.   Cardiovascular: Positive for palpitations. Negative for chest pain and leg swelling.  Gastrointestinal: Positive for constipation. Negative for abdominal pain, blood in stool, abdominal distention and anal bleeding.  Genitourinary: Positive for vaginal bleeding. Negative for difficulty urinating and pelvic pain.  Musculoskeletal: Positive for gait problem.  Neurological: Positive for  numbness and headaches. Negative for dizziness, tremors, facial asymmetry, speech difficulty and weakness.       Objective:   Physical Exam  Constitutional: She is oriented to person, place, and time. She appears well-developed.  HENT:  Head: Normocephalic.  Eyes: Conjunctivae and EOM are normal. Pupils are equal, round, and reactive to light. Right eye exhibits no discharge. Left eye exhibits no discharge. No scleral icterus.  Neck: Neck supple.  Cardiovascular: Normal rate, regular rhythm and normal heart sounds.   Pulmonary/Chest: Effort normal and breath sounds normal. No respiratory distress.  Abdominal: Soft. Bowel sounds are normal. She exhibits distension. There is no tenderness. There is no rebound.  Musculoskeletal: Normal range of motion.  Neurological: She is alert and oriented to person, place, and time. She has normal strength. She displays no tremor. A sensory deficit is present. Coordination normal.       Sensation deficit present on right face and left arm and leg.   Skin: Skin is dry.          Assessment & Plan:

## 2010-09-23 NOTE — Assessment & Plan Note (Signed)
The patient is tolerating the medication well. I will continue current regimen. I will check at the next office visit liver function test.

## 2010-09-23 NOTE — Assessment & Plan Note (Signed)
Patient was found to hospitalization to have a hemoglobin A1c of 6.1. Patient was informed about diet control and exercise. Patient has significant BMI. Informed the patient about classes with [facility]ighbor.

## 2010-09-23 NOTE — Assessment & Plan Note (Signed)
Patient TIA on 09/13/2010. Workup was negative. Patient was started on Crestor, aspirin and lisinopril hydrochlorothiazide. Patient is noted to have persistent sensation deficit and headache. Vision changes as mentioned per patient is unclear at this point. On physical exam unremarkable. We'll continue to monitor. Patient was informed if experiencing worsening headache, significant vision changes hearing loss and/or weakness, numbness or tingling to call the clinic or be evaluated immediately in the ED. The patient has significant history of stroke and MI in the family. We'll continue current regimen. Furthermore patient will benefit with continued PT/OT.

## 2010-09-23 NOTE — Assessment & Plan Note (Signed)
Patient noted that she has been chronic vaginal bleeding. Has been evaluated by her gynecologist Dr. Tenny Craw. He did a biopsy per patient which was normal. She further noted that she has some lesions on her ovaries. We have no documentation concerning this. Will obtain records and let the patient further at that point.

## 2010-09-23 NOTE — Assessment & Plan Note (Signed)
Patient noted during hospitalization persistent troubles staying asleep, during the day insomnia or hypersomnia and not feeling rested. Patient has significant high BMI. Consider at this point if patient has obstructive sleep apnea. Refer the patient for a sleep study.

## 2010-09-23 NOTE — Assessment & Plan Note (Signed)
All continue current regimen at this point.

## 2010-10-09 NOTE — Discharge Summary (Signed)
[name], [name] NO.:  [id number]  MEDICAL RECORD NO.:  [id number]  LOCATION:  3021                         FACILITY:  [facility]  PHYSICIAN:  Ileana Roup, M.D.  DATE OF BIRTH:  31-Jul-1940  DATE OF ADMISSION:  09/13/2010 DATE OF DISCHARGE:  09/15/2010                              DISCHARGE SUMMARY   DISCHARGE DIAGNOSES: 1. Transfused ischemic attack - manifested by left-sided numbness of     face, arm, and leg. 2. Gastroesophageal reflux disease. 3. Hypertension. 4. Hyperlipidemia. 5. Likely obstructive sleep apnea - outpatient sleep study     recommended. 6. Prediabetes - hemoglobin A1c 6.1.  DISCHARGE MEDICATIONS: 1. Aspirin 81 mg by mouth daily. 2. Crestor 5 mg tabs once by mouth daily. 3. Lisinopril/hydrochlorothiazide 10/12.5 mg tabs by mouth once every morning. 4. Nexium 40 mg tabs by mouth once every evening.  DISPOSITION AND FOLLOWUP:  The patient is to follow up at the [facility] on September 23, 2010 at 2:15 p.m. at which time blood pressure should be assessed to evaluate the patient's hypertensive regimen and consideration for escalation of therapy as indicated.  The patient will also require liver function test in 4-6 weeks that she was just started on statin therapy in the setting of hyperlipidemia.  The patient should be also further counseled on weight loss and nutrition.  Lastly,  the patient should be set up for outpatient sleep study in the setting of likely obstructive sleep apnea indicated by nightly snoring, daytime fatigue, large body habitus.  PROCEDURES PERFORMED: 1. CT head without contrast - September 13, 2010 - cerebral atrophy.  No     evidence of acute intracranial hemorrhage, mass lesions, or acute     infarction. 2. MRI brain without contrast - September 13, 2010 - no acute intracranial     abnormality.  Scattered cortical T2 hyperintensities are slightly     greater than expected for age.   These findings are nonspecific but     can be seen in setting of chronic microvascular ischemia, the     myelinating process such as multiple sclerosis, vascularities,     complicated migraine headache, or as sequelae of her prior     infectious inflammatory process.  Chronic left splenoid sinus     disease. 3. MRA head - normal MRA circle of Willis as well as lab work - September 13, 2010. 4. Carotid Dopplers - September 14, 2010 - no significant extracranial     carotid artery stenosis.  Vertebrals are patent antegrade flow. 5. A 2-D echocardiogram - September 14, 2010 - left ventricle with normal     systolic function with the an EF of 55-60%.  Normal wall motion     without regional wall motion abnormalities.  CONSULTATIONS:  None.  BRIEF ADMITTING HISTORY AND PHYSICAL:  The patient is a 70 year old female with a past medical history of hypertension, nonobstructive coronary artery disease, morbid obesity, prior TIA in 2004, who presented for evaluation of left-sided numbness that began on the evening prior to admission.  The patient first noticed numbness sensation of her left arm and leg  on the evening prior to admission. Then, experienced some numbness sensation to the left side of her face. The patient attempted sleep, thinking that symptoms may resolve overnight, perhaps attributed to fatigue.  However, when she awoke in the morning of admission, facial numbness was persistent, at which finally prompted her presentation to the ER.  Since onset, symptoms were slowly improving.  Symptoms were associated with palpitations and mild nausea, without vomiting.  The patient has also had persistent left cerebral and left parietal headache, described as 3/10, nonradiating pain without specific aggravating or alleviating factors.  The patient did not experience visual changes, facial droop, difficulty swallowing, or chest pain.  The patient's daughter provided a 325 mg aspirin on the morning of  admission.  States that she is taking her medications regularly without missing tablets.  Surrounding her symptoms, the patient checked her blood pressure at home and was sounded to be 200/110 mmHg.  However, typically the patient's blood pressure are well controlled in the 120s-130s systolic.  PHYSICAL EXAMINATION ON ADMISSION:  VITAL SIGNS:  Temperature 98.3, pulse 74, blood pressure 182/92, then 123/50 without medication, respiratory rate 18, and O2 saturation 98% on room air. GENERAL:  The patient is in no acute distress, well developed, well nourished. HEAD:  Normocephalic and atraumatic. EYES:  PERRL, EOMI.  No signs of anemia or jaundice. NOSE:  Moist mucous membranes, noninflamed nonerythematous. THROAT:  Oropharynx nonerythematous, no exudates appreciated. NECK:  No deformities, masses or tenderness noted.  Supple.  No carotid bruits.  No JVD. LUNGS:  Normal respiratory effort.  Clear to auscultation bilaterally without crackles, or wheezes. HEART:  Regular rate and rhythm without murmurs, rubs, or gallops. ABDOMEN:  Bowel sounds normoactive.  Soft, nontender, nondistended. EXTREMITIES:  No pretibial edema. NEUROLOGIC:  Alert and oriented x3, cranial nerves II-XII grossly intact.  Motor strength 5/5 in all 4 extremities.  Diminished sensation of the left base as compared to right.  SKIN:  No visible rashes or scars.  LABS ON ADMISSION: 1. CBC:  WBC 10, hemoglobin 12.8, hematocrit 38.3, platelets 223. 2. BMET:  Sodium 139, potassium 3.9, chloride 103, bicarb 26, glucose     107, BUN 22, creatinine 0.71, and calcium 8.8. 3. Coagulation studies, PT 13.4, INR of 1, PTT 30.  HOSPITAL COURSE: 1. TIA - upon presentation, symptoms have been persistent for greater     than 12 hours and were slowly improving with resolution of left     facial numbness during hospital course.  The patient's risk factors     for TIA included hypertension, morbid obesity, prior TIA in the      setting of known coronary artery disease.  The patient was admitted     to telemetry floor for stroke/TIA workup.  CT head was negative for     intracranial hemorrhage.  MRI of the brain was negative for acute     stroke.  MRI indicated patent circle of Willis.  In the TIA, workup     carotid Dopplers and 2-D echocardiogram were ordered and found to     be within normal limits with results described as above.  The     patient was also started on secondary preventative measures,     including aspirin, statin therapy, and was provided PT and OT     services during the hospital course.  Surrounding the 24-hour acute     TIA, time frame, the patient was initially held of her     antihypertensives which were then  resumed once out of the 24-hour     time frame of onset of symptoms.  The patient will be discharged     home on aspirin, statin, ACE inhibitor, thiazide diuretic.  The     patient was started on oral medications only after swallow screen     was passed per stroke protocol. 2. GERD - stable.  Continued on PPI. 3. Hypertension - continued on home lisinopril/hydrochlorothiazide     once out of 24-hour period of TIA symptoms. 4. Hyperlipidemia - fasting lipid panel was checked and found with     total cholesterol of 166, triglycerides 88, HDL 41, LDL 107, and     VLDL 18.  The patient was started on low-dose Crestor.  She will     have followup liver function test in 4-6 weeks after starting on     statin therapy.  She is counseled to call the clinic immediately if     she felt myalgias or other side effects. 5. Prediabetes - hemoglobin A1c is 6.1.  The patient counseled of     diabetes nutrition. 6. Obstructive sleep apnea - during hospital course the patient     endorsed symptoms of the morning, daytime sleepiness, and in the     setting of her morbid obesity, the patient likely have sleep apnea.     We would recommend an outpatient sleep study for further     evaluation.  Vital  signs on discharge, temperature 97.8, pulse 57, respirations 16, and blood pressure 151/70.  LABS ON DISCHARGE:  BMET:  Sodium 139, potassium 3.7, chloride 105, bicarb 27, glucose 102, BUN 14, and creatinine 0.55.  CBC:  WBC 8, hemoglobin 12.2, hematocrit 37.6, and platelets 220.    ______________________________ Johnette Abraham, DO   ______________________________ Ileana Roup, M.D.    SK/MEDQ  D:  09/15/2010  T:  09/15/2010  Job:  [id number]  cc:   Dr. Almyra Deforest  Electronically Signed by Johnette Abraham DO on 09/16/2010 02:28:56 PM Electronically Signed by Margarito Liner M.D. on 10/09/2010 02:04:35 PM

## 2010-10-18 ENCOUNTER — Encounter (HOSPITAL_BASED_OUTPATIENT_CLINIC_OR_DEPARTMENT_OTHER): Payer: BC Managed Care – PPO

## 2010-11-02 ENCOUNTER — Encounter: Payer: Self-pay | Admitting: Internal Medicine

## 2010-11-02 ENCOUNTER — Ambulatory Visit (INDEPENDENT_AMBULATORY_CARE_PROVIDER_SITE_OTHER): Payer: BC Managed Care – PPO | Admitting: Internal Medicine

## 2010-11-02 VITALS — BP 136/65 | HR 79 | Temp 97.9°F | Resp 20 | Ht 62.0 in | Wt 293.6 lb

## 2010-11-02 DIAGNOSIS — R739 Hyperglycemia, unspecified: Secondary | ICD-10-CM

## 2010-11-02 DIAGNOSIS — N939 Abnormal uterine and vaginal bleeding, unspecified: Secondary | ICD-10-CM

## 2010-11-02 DIAGNOSIS — G4700 Insomnia, unspecified: Secondary | ICD-10-CM

## 2010-11-02 DIAGNOSIS — G459 Transient cerebral ischemic attack, unspecified: Secondary | ICD-10-CM

## 2010-11-02 DIAGNOSIS — R9389 Abnormal findings on diagnostic imaging of other specified body structures: Secondary | ICD-10-CM

## 2010-11-02 DIAGNOSIS — R7309 Other abnormal glucose: Secondary | ICD-10-CM

## 2010-11-02 DIAGNOSIS — Z299 Encounter for prophylactic measures, unspecified: Secondary | ICD-10-CM

## 2010-11-02 DIAGNOSIS — G4733 Obstructive sleep apnea (adult) (pediatric): Secondary | ICD-10-CM

## 2010-11-02 DIAGNOSIS — N898 Other specified noninflammatory disorders of vagina: Secondary | ICD-10-CM

## 2010-11-02 DIAGNOSIS — I10 Essential (primary) hypertension: Secondary | ICD-10-CM

## 2010-11-02 DIAGNOSIS — E785 Hyperlipidemia, unspecified: Secondary | ICD-10-CM

## 2010-11-02 DIAGNOSIS — Z23 Encounter for immunization: Secondary | ICD-10-CM | POA: Insufficient documentation

## 2010-11-02 DIAGNOSIS — R413 Other amnesia: Secondary | ICD-10-CM

## 2010-11-02 HISTORY — DX: Insomnia, unspecified: G47.00

## 2010-11-02 HISTORY — DX: Other amnesia: R41.3

## 2010-11-02 LAB — COMPREHENSIVE METABOLIC PANEL
ALT: 15 U/L (ref 0–35)
Albumin: 3.7 g/dL (ref 3.5–5.2)
BUN: 14 mg/dL (ref 6–23)
CO2: 26 mEq/L (ref 19–32)
Calcium: 9.3 mg/dL (ref 8.4–10.5)
Chloride: 101 mEq/L (ref 96–112)
Creat: 0.69 mg/dL (ref 0.50–1.10)

## 2010-11-02 NOTE — Assessment & Plan Note (Signed)
Given: Tdap and Pneumovax

## 2010-11-02 NOTE — Assessment & Plan Note (Signed)
Will check liver function test today.

## 2010-11-02 NOTE — Patient Instructions (Signed)
Please check your Blood pressure daily for one week and call the clinic for results.

## 2010-11-02 NOTE — Assessment & Plan Note (Signed)
Unlikely any neurological origin. Non-focal symptoms with negative physical exam. Patient's MRI/MRA in June did not show any acute process but only scattered subcortical T2 hyperintensities  Likely in the setting of chronic microvascular ischemia with Normal MRA circle of Willis. Patient is currently  On statin and aspirin and BP is controlled. Patient informed about the findings but would like further input by Neurology. Will therefore refer patient.

## 2010-11-02 NOTE — Assessment & Plan Note (Signed)
Per CT report in 05/2009. Follow up ultrasound is recommended. Patient is followed by Dr Tenny Craw.

## 2010-11-02 NOTE — Progress Notes (Signed)
  Subjective:    Patient ID: Shirley [name], female    DOB: February 15, 1941, 70 y.o.   MRN: [id number]  Hypertension Associated symptoms include headaches and palpitations. Pertinent negatives include no shortness of breath.   This is 70 year old female with PMH significant  For TIA (09/2010), HTN, HLD who presented to the clinic with multiple complains: 1. Patient noted that she is experiencing recurrent episodes of numbness in the face, episodes of dizziness, occasionally headache and some gait instability. The gait instability is more that whenever she walks she feels dizzy. Denies at that moment any chest pain, chest tightness or SOB. She did not experience any falls. She is taking all her medications. 2. Patient noted that she has some heaviness feeling in her right 2 toes. Is is not numbness. This experiencing comes and goes. No swelling or rash noted.  3. Memory loss per daughter. She noted that she has to repeat many thing again since the patient would not remember. Sometimes she does not know what day or time it is. She is able to do her ADLs, does her own cooking, grocery shopping and driving. The daughter noted that she is acts sometimes too anxious. 4. Patient has occasionally some trouble with sleep. She was not able to schedule a sleep study since she has to pay 3000 dollar per daughter. 5. Patient noted that her BP may be occasionally go up and down.    Review of Systems  Constitutional: Negative for chills, fatigue and unexpected weight change.  Eyes: Positive for pain and visual disturbance.  Respiratory: Negative for chest tightness and shortness of breath.   Cardiovascular: Positive for palpitations and leg swelling.  Gastrointestinal: Positive for blood in stool and abdominal distention. Negative for abdominal pain.  Genitourinary: Negative for difficulty urinating.  Musculoskeletal: Positive for back pain and gait problem.  Neurological: Positive for dizziness, light-headedness,  numbness and headaches. Negative for speech difficulty and weakness.       Objective:   Physical Exam  Constitutional: She is oriented to person, place, and time. She appears well-developed.  HENT:  Head: Normocephalic.  Neck: Neck supple.  Cardiovascular: Normal rate, regular rhythm and normal heart sounds.   Pulmonary/Chest: Effort normal and breath sounds normal.  Abdominal: Soft. Bowel sounds are normal. She exhibits distension. There is no tenderness.  Musculoskeletal: Normal range of motion. She exhibits no edema and no tenderness.  Neurological: She is alert and oriented to person, place, and time. No cranial nerve deficit or sensory deficit. She exhibits normal muscle tone. Coordination normal.  Skin: Skin is warm.          Assessment & Plan:   No problem-specific assessment & plan notes found for this encounter.

## 2010-11-02 NOTE — Assessment & Plan Note (Signed)
Patient may have early dementia. Possible vascular related but patient has strong history of alzheimer in mother and grandmother. Due to time issue I was not able to do a MMS today. Will obtain at the next office visit.

## 2010-11-02 NOTE — Assessment & Plan Note (Signed)
Patient sometimes trouble to fall a sleep and sometimes trouble staying a sleep. No sleep study can be performed due to to financial problem. Patient does not want any medication at this point. Discussed sleep hygiene and relaxation techniques .

## 2010-11-02 NOTE — Assessment & Plan Note (Signed)
BP controlled. Per patient episodes of high BP. Informed the daughter and patient not check BP daily for  1 week and call in the reading . May consider changes in management accordingly.

## 2010-11-02 NOTE — Assessment & Plan Note (Signed)
Patient may have sleep apnea. Unfortunately she was not able to schedule since daughter noted that she needs to pay 3000 dollar.

## 2010-11-03 LAB — MICROALBUMIN / CREATININE URINE RATIO: Microalb, Ur: 0.5 mg/dL (ref 0.00–1.89)

## 2011-01-13 LAB — COMPREHENSIVE METABOLIC PANEL
Alkaline Phosphatase: 97
BUN: 11
CO2: 24
Chloride: 101
Creatinine, Ser: 0.85
GFR calc non Af Amer: >60
Total Bilirubin: 0.7

## 2011-01-13 LAB — INFLUENZA A AND B ANTIGEN (CONVERTED LAB): Influenza B Ag: NEGATIVE

## 2011-01-13 LAB — OCCULT BLOOD X 1 CARD TO LAB, STOOL: Fecal Occult Bld: NEGATIVE

## 2011-01-13 LAB — CBC
MCV: 85.1
Platelets: 208
RDW: 13.7
WBC: 11.9 — ABNORMAL HIGH

## 2011-01-13 LAB — DIFFERENTIAL
Basophils Absolute: 0
Basophils Relative: 0
Eosinophils Relative: 0
Lymphocytes Relative: 7 — ABNORMAL LOW
Neutro Abs: 10.8 — ABNORMAL HIGH

## 2011-05-23 ENCOUNTER — Emergency Department (INDEPENDENT_AMBULATORY_CARE_PROVIDER_SITE_OTHER): Payer: BC Managed Care – PPO

## 2011-05-23 ENCOUNTER — Encounter (HOSPITAL_COMMUNITY): Payer: Self-pay | Admitting: *Deleted

## 2011-05-23 ENCOUNTER — Emergency Department (HOSPITAL_COMMUNITY)
Admission: EM | Admit: 2011-05-23 | Discharge: 2011-05-23 | Disposition: A | Discharge status: Home / Self Care | Payer: BC Managed Care – PPO | Source: Home / Self Care | Attending: Family Medicine | Admitting: Family Medicine

## 2011-05-23 DIAGNOSIS — J329 Chronic sinusitis, unspecified: Secondary | ICD-10-CM

## 2011-05-23 DIAGNOSIS — J069 Acute upper respiratory infection, unspecified: Secondary | ICD-10-CM

## 2011-05-23 MED ORDER — TRAMADOL HCL 50 MG PO TABS: 50.0000 mg | ORAL_TABLET | Freq: Four times a day (QID) | ORAL | Status: AC | PRN

## 2011-05-23 MED ORDER — FLUTICASONE PROPIONATE 50 MCG/ACT NA SUSP: 2.0000 | Freq: Every day | NASAL | Status: DC

## 2011-05-23 MED ORDER — PREDNISONE (PAK) 10 MG PO TABS: ORAL_TABLET | ORAL | Status: AC

## 2011-05-23 NOTE — Discharge Instructions (Signed)
Antibiotic Nonuse  Your caregiver felt that the infection or problem was not one that would be helped with an antibiotic. Infections may be caused by viruses or bacteria. Only a caregiver can tell which one of these is the likely cause of an illness. A cold is the most common cause of infection in both adults and children. A cold is a virus. Antibiotic treatment will have no effect on a viral infection. Viruses can lead to many lost days of work caring for sick children and many missed days of school. Children may catch as many as 10 "colds" or "flus" per year during which they can be tearful, cranky, and uncomfortable. The goal of treating a virus is aimed at keeping the ill person comfortable. Antibiotics are medications used to help the body fight bacterial infections. There are relatively few types of bacteria that cause infections but there are hundreds of viruses. While both viruses and bacteria cause infection they are very different types of germs. A viral infection will typically go away by itself within 7 to 10 days. Bacterial infections may spread or get worse without antibiotic treatment. Examples of bacterial infections are:  Sore throats (like strep throat or tonsillitis).   Infection in the lung (pneumonia).   Ear and skin infections.  Examples of viral infections are:  Colds or flus.   Most coughs and bronchitis.   Sore throats not caused by Strep.   Runny noses.  It is often best not to take an antibiotic when a viral infection is the cause of the problem. Antibiotics can kill off the helpful bacteria that we have inside our body and allow harmful bacteria to start growing. Antibiotics can cause side effects such as allergies, nausea, and diarrhea without helping to improve the symptoms of the viral infection. Additionally, repeated uses of antibiotics can cause bacteria inside of our body to become resistant. That resistance can be passed onto harmful bacterial. The next time  you have an infection it may be harder to treat if antibiotics are used when they are not needed. Not treating with antibiotics allows our own immune system to develop and take care of infections more efficiently. Also, antibiotics will work better for [place] when they are prescribed for bacterial infections. Treatments for a child that is ill may include:  Give extra fluids throughout the day to stay hydrated.   Get plenty of rest.   Only give your child over-the-counter or prescription medicines for pain, discomfort, or fever as directed by your caregiver.   The use of a cool mist humidifier may help stuffy noses.   Cold medications if suggested by your caregiver.  Your caregiver may decide to start you on an antibiotic if:  The problem you were seen for today continues for a longer length of time than expected.   You develop a secondary bacterial infection.  SEEK MEDICAL CARE IF:  Fever lasts longer than 5 days.   Symptoms continue to get worse after 5 to 7 days or become severe.   Difficulty in breathing develops.   Signs of dehydration develop (poor drinking, rare urinating, dark colored urine).   Changes in behavior or worsening tiredness (listlessness or lethargy).  Document Released: 05/31/2001 Document Revised: 12/02/2010 Document Reviewed: 11/27/2008 [facility] Patient Information 2012 [place], [place].Upper Respiratory Infection, Adult An upper respiratory infection (URI) is also sometimes known as the common cold. The upper respiratory tract includes the nose, sinuses, throat, trachea, and bronchi. Bronchi are the airways leading to the lungs. Most  people improve within 1 week, but symptoms can last up to 2 weeks. A residual cough may last even longer.  CAUSES Many different viruses can infect the tissues lining the upper respiratory tract. The tissues become irritated and inflamed and often become very moist. Mucus production is also common. A cold is contagious. You can easily  spread the virus to others by oral contact. This includes kissing, sharing a glass, coughing, or sneezing. Touching your mouth or nose and then touching a surface, which is then touched by another person, can also spread the virus. SYMPTOMS  Symptoms typically develop 1 to 3 days after you come in contact with a cold virus. Symptoms vary from person to person. They may include:  Runny nose.   Sneezing.   Nasal congestion.   Sinus irritation.   Sore throat.   Loss of voice (laryngitis).   Cough.   Fatigue.   Muscle aches.   Loss of appetite.   Headache.   Low-grade fever.  DIAGNOSIS  You might diagnose your own cold based on familiar symptoms, since most people get a cold 2 to 3 times a year. Your caregiver can confirm this based on your exam. Most importantly, your caregiver can check that your symptoms are not due to another disease such as strep throat, sinusitis, pneumonia, asthma, or epiglottitis. Blood tests, throat tests, and X-rays are not necessary to diagnose a common cold, but they may sometimes be helpful in excluding other more serious diseases. Your caregiver will decide if any further tests are required. RISKS AND COMPLICATIONS  You may be at risk for a more severe case of the common cold if you smoke cigarettes, have chronic heart disease (such as heart failure) or lung disease (such as asthma), or if you have a weakened immune system. The very young and very old are also at risk for more serious infections. Bacterial sinusitis, middle ear infections, and bacterial pneumonia can complicate the common cold. The common cold can worsen asthma and chronic obstructive pulmonary disease (COPD). Sometimes, these complications can require emergency medical care and may be life-threatening. PREVENTION  The best way to protect against getting a cold is to practice good hygiene. Avoid oral or hand contact with people with cold symptoms. Wash your hands often if contact occurs.  There is no clear evidence that vitamin C, vitamin E, echinacea, or exercise reduces the chance of developing a cold. However, it is always recommended to get plenty of rest and practice good nutrition. TREATMENT  Treatment is directed at relieving symptoms. There is no cure. Antibiotics are not effective, because the infection is caused by a virus, not by bacteria. Treatment may include:  Increased fluid intake. Sports drinks offer valuable electrolytes, sugars, and fluids.   Breathing heated mist or steam (vaporizer or shower).   Eating chicken soup or other clear broths, and maintaining good nutrition.   Getting plenty of rest.   Using gargles or lozenges for comfort.   Controlling fevers with ibuprofen or acetaminophen as directed by your caregiver.   Increasing usage of your inhaler if you have asthma.  Zinc gel and zinc lozenges, taken in the first 24 hours of the common cold, can shorten the duration and lessen the severity of symptoms. Pain medicines may help with fever, muscle aches, and throat pain. A variety of non-prescription medicines are available to treat congestion and runny nose. Your caregiver can make recommendations and may suggest nasal or lung inhalers for other symptoms.  HOME CARE INSTRUCTIONS     Only take over-the-counter or prescription medicines for pain, discomfort, or fever as directed by your caregiver.   Use a warm mist humidifier or inhale steam from a shower to increase air moisture. This may keep secretions moist and make it easier to breathe.   Drink enough water and fluids to keep your urine clear or pale yellow.   Rest as needed.   Return to work when your temperature has returned to normal or as your caregiver advises. You may need to stay home longer to avoid infecting others. You can also use a face mask and careful hand washing to prevent spread of the virus.  SEEK MEDICAL CARE IF:   After the first few days, you feel you are getting worse  rather than better.   You need your caregiver's advice about medicines to control symptoms.   You develop chills, worsening shortness of breath, or brown or red sputum. These may be signs of pneumonia.   You develop yellow or brown nasal discharge or pain in the face, especially when you bend forward. These may be signs of sinusitis.   You develop a fever, swollen neck glands, pain with swallowing, or white areas in the back of your throat. These may be signs of strep throat.  SEEK IMMEDIATE MEDICAL CARE IF:   You have a fever.   You develop severe or persistent headache, ear pain, sinus pain, or chest pain.   You develop wheezing, a prolonged cough, cough up blood, or have a change in your usual mucus (if you have chronic lung disease).   You develop sore muscles or a stiff neck.  Document Released: 09/15/2000 Document Revised: 12/02/2010 Document Reviewed: 07/24/2010 [facility] Patient Information 2012 [place], [place].Viral Infections A viral infection can be caused by different types of viruses.Most viral infections are not serious and resolve on their own. However, some infections may cause severe symptoms and may lead to further complications. SYMPTOMS Viruses can frequently cause:  Minor sore throat.   Aches and pains.   Headaches.   Runny nose.   Different types of rashes.   Watery eyes.   Tiredness.   Cough.   Loss of appetite.   Gastrointestinal infections, resulting in nausea, vomiting, and diarrhea.  These symptoms do not respond to antibiotics because the infection is not caused by bacteria. However, you might catch a bacterial infection following the viral infection. This is sometimes called a "superinfection." Symptoms of such a bacterial infection may include:  Worsening sore throat with pus and difficulty swallowing.   Swollen neck glands.   Chills and a high or persistent fever.   Severe headache.   Tenderness over the sinuses.   Persistent  overall ill feeling (malaise), muscle aches, and tiredness (fatigue).   Persistent cough.   Yellow, green, or brown mucus production with coughing.  HOME CARE INSTRUCTIONS   Only take over-the-counter or prescription medicines for pain, discomfort, diarrhea, or fever as directed by your caregiver.   Drink enough water and fluids to keep your urine clear or pale yellow. Sports drinks can provide valuable electrolytes, sugars, and hydration.   Get plenty of rest and maintain proper nutrition. Soups and broths with crackers or rice are fine.  SEEK IMMEDIATE MEDICAL CARE IF:   You have severe headaches, shortness of breath, chest pain, neck pain, or an unusual rash.   You have uncontrolled vomiting, diarrhea, or you are unable to keep down fluids.   You or your child has an oral temperature above 102 F (38.9  C), not controlled by medicine.   Your baby is older than 3 months with a rectal temperature of 102 F (38.9 C) or higher.   Your baby is 2 months old or younger with a rectal temperature of 100.4 F (38 C) or higher.  MAKE SURE YOU:   Understand these instructions.   Will watch your condition.   Will get help right away if you are not doing well or get worse.  Document Released: 12/30/2004 Document Revised: 12/02/2010 Document Reviewed: 07/27/2010 [facility] Patient Information 2012 [place], [place].

## 2011-05-23 NOTE — ED Notes (Signed)
Pt with onset of body aches/cough/congestion/chills x one week - taking coricidin otc

## 2011-05-23 NOTE — ED Provider Notes (Signed)
History     CSN: [id number]  Arrival date & time 05/23/11  1127   First MD Initiated Contact with Patient 05/23/11 1227      12:58 PM HPI Shirley [name] is a 71 y.o. female complaining of an upper respiratory tract infection. Reports symptoms have been present for approximately one week. Reports positive sick contacts as she is a Runner, broadcasting/film/video at a school. Symptoms include nasal congestion, generalized body aches, nonproductive cough, and mild sore throat. Denies fever, neck pain, chest pain, shortness of breath. Reports tried over-the-counter medications such as Coricidin without relief.  Patient is a 71 y.o. female presenting with URI. The history is provided by the patient.  URI The primary symptoms include sore throat, cough and myalgias. Primary symptoms do not include fever, headaches, ear pain, wheezing, abdominal pain, nausea or vomiting. The current episode started more than 1 week ago. This is a new problem. The problem has not changed since onset. The sore throat is moderate in intensity. The sore throat is not accompanied by trouble swallowing, drooling or hoarse voice.  The cough is new. The cough is productive.  The myalgias have been unchanged since their onset. The myalgias are generalized. The myalgias are aching. The discomfort from the myalgias is moderate. The myalgias are not associated with weakness.  The onset of the illness is associated with exposure to sick contacts. Symptoms associated with the illness include chills, sinus pressure, congestion and rhinorrhea.    Past Medical History  Diagnosis Date  . Hypertension   . Hemorrhoid   . GERD (gastroesophageal reflux disease)   . Adenomatous polyps   . DVT (deep venous thrombosis) 2003    lower extremity DVT with questionable recurrence in 2005. Treated with 1.5 year course of coumadin.  [name] History of TIAs     2004 - no documentation available. 09/2010 -  was not on aspirin or statin therapy prior to event.  .  Endometrial thickening on ultra sound 07/2009    path showing Fragments of benign endocervical and squamous mucosa. No dysplasia or malignancy // Followed by Dr. Miguel Aschoff  . Coronary artery disease, non-occlusive 11/2003    LHC (11/2003) - LAD diffuse 30% stenosis of mid-vessel. RCA with 30% stenosis of proximal vessel - by Dr. Andee Lineman. // 2D Echo (04/2006) -  LV EF 65%.  Mild LVH. No  wall motion abnormalities.  Mild diastolic dysfunction.   . Prediabetes DX: 09/2010    HgA1c 6.1    Past Surgical History  Procedure Date  . Tubal ligation   . Knee surgery 1996    Left knee arthroscopy.  . Colonoscopy w/ polypectomy 11/01/2007    8 mm rectal adenoma, hemorrhoids  . Rotator cuff repair     right.    Family History  Problem Relation Age of Onset  . Thyroid cancer Sister   . Liver cancer Sister   . Diabetes Mother   . Hyperlipidemia Mother   . Heart disease Mother   . Dementia Mother   . Angina Father   . Miscarriages / Stillbirths Father 54  . Heart disease Brother     x 2 in 33s yo  . Alzheimer's disease Maternal Grandmother     History  Substance Use Topics  . Smoking status: Never Smoker   . Smokeless tobacco: Never Used  . Alcohol Use: No    OB History    Grav Para Term Preterm Abortions TAB SAB Ect Mult Living  Review of Systems  Constitutional: Positive for chills. Negative for fever.  HENT: Positive for congestion, sore throat, rhinorrhea and sinus pressure. Negative for ear pain, hoarse voice, drooling, trouble swallowing and neck pain.   Respiratory: Positive for cough. Negative for shortness of breath and wheezing.   Cardiovascular: Negative for chest pain.  Gastrointestinal: Negative for nausea, vomiting and abdominal pain.  Musculoskeletal: Positive for myalgias.  Neurological: Negative for dizziness, weakness, numbness and headaches.  All other systems reviewed and are negative.    Allergies  Review of patient's allergies indicates  no known allergies.  Home Medications   Current Outpatient Rx  Name Route Sig Dispense Refill  . CORICIDIN D PO Oral Take by mouth.    . ASPIRIN 81 MG PO TBEC Oral Take 81 mg by mouth daily.      [name] ESOMEPRAZOLE MAGNESIUM 40 MG PO CPDR Oral Take 1 capsule (40 mg total) by mouth daily. Take 30 minutes before supper 30 capsule 1  . LISINOPRIL-HYDROCHLOROTHIAZIDE 10-12.5 MG PO TABS Oral Take 1 tablet by mouth daily.      [name] ROSUVASTATIN CALCIUM 5 MG PO TABS Oral Take 5 mg by mouth daily.        BP 144/88  Pulse 59  Temp(Src) 98 F (36.7 C) (Oral)  Resp 18  SpO2 98%  Physical Exam  Vitals reviewed. Constitutional: She is oriented to person, place, and time. Vital signs are normal. She appears well-developed and well-nourished.  HENT:  Head: Normocephalic and atraumatic.  Right Ear: External ear normal.  Left Ear: Tympanic membrane, external ear and ear canal normal.  Nose: Nose normal. No rhinorrhea. Right sinus exhibits no maxillary sinus tenderness and no frontal sinus tenderness. Left sinus exhibits no maxillary sinus tenderness and no frontal sinus tenderness.  Mouth/Throat: Oropharynx is clear and moist. No oropharyngeal exudate.  Eyes: Conjunctivae are normal. Pupils are equal, round, and reactive to light.  Neck: Normal range of motion. Neck supple.  Cardiovascular: Normal rate, regular rhythm and normal heart sounds.  Exam reveals no gallop and no friction rub.   No murmur heard. Pulmonary/Chest: Effort normal and breath sounds normal. She has no wheezes. She has no rhonchi. She has no rales. She exhibits no tenderness.  Musculoskeletal: Normal range of motion.  Lymphadenopathy:    She has no cervical adenopathy.  Neurological: She is alert and oriented to person, place, and time. Coordination normal.  Skin: Skin is warm and dry. No rash noted. No erythema. No pallor.    ED Course  Procedures   MDM  Discussed with patient she likely has an upper respiratory tract  infection. Will treat to try draining sinuses. Patient requests a chest x-ray to check for pneumonia. I advised patient that lungs were clear but we could do a chest x-ray   1:50 PM DG Chest 2 View (Final result)   Result time:05/23/11 1347    Final result by Rad Results In Interface (05/23/11 13:47:50)    Narrative:   *RADIOLOGY REPORT*  Clinical Data: Cough, congestion.  CHEST - 2 VIEW  Comparison: None.  Findings: Mild cardiomegaly. Coarse bibasilar interstitial markings of uncertain chronicity. No confluent airspace consolidation. No effusion. Tortuous thoracic aorta. Mild lower thoracic spondylitic change.  IMPRESSION:  1. Mild cardiomegaly. 2. Prominent bibasilar interstitial markings, of uncertain chronicity.      Chest x-ray does not appear to have any acute findings. Advised recheck with primary care physician  next week. Will treat for upper respiratory tract infection. Discussed this with patient  and family and they are ready for discharge.  Thomasene Lot, PA-C 05/23/11 1351

## 2011-05-24 NOTE — ED Provider Notes (Signed)
Medical screening examination/treatment/procedure(s) were performed by non-physician practitioner and as supervising physician I was immediately available for consultation/collaboration.   MORENO-COLL,Taria Castrillo; MD   Evadean Sproule Moreno-Coll, MD 05/24/11 1611 

## 2011-06-01 ENCOUNTER — Other Ambulatory Visit: Payer: Self-pay | Admitting: Obstetrics and Gynecology

## 2011-09-17 ENCOUNTER — Encounter: Payer: Self-pay | Admitting: Internal Medicine

## 2012-09-15 ENCOUNTER — Observation Stay (HOSPITAL_COMMUNITY)
Admission: EM | Admit: 2012-09-15 | Discharge: 2012-09-17 | Disposition: A | Discharge status: Home / Self Care | Payer: BC Managed Care – PPO | Attending: Internal Medicine | Admitting: Internal Medicine

## 2012-09-15 ENCOUNTER — Emergency Department (HOSPITAL_COMMUNITY): Payer: BC Managed Care – PPO

## 2012-09-15 ENCOUNTER — Encounter (HOSPITAL_COMMUNITY): Payer: Self-pay | Admitting: Emergency Medicine

## 2012-09-15 DIAGNOSIS — R413 Other amnesia: Secondary | ICD-10-CM

## 2012-09-15 DIAGNOSIS — Z7982 Long term (current) use of aspirin: Secondary | ICD-10-CM | POA: Insufficient documentation

## 2012-09-15 DIAGNOSIS — I82409 Acute embolism and thrombosis of unspecified deep veins of unspecified lower extremity: Secondary | ICD-10-CM | POA: Diagnosis present

## 2012-09-15 DIAGNOSIS — R739 Hyperglycemia, unspecified: Secondary | ICD-10-CM

## 2012-09-15 DIAGNOSIS — G4700 Insomnia, unspecified: Secondary | ICD-10-CM

## 2012-09-15 DIAGNOSIS — R9389 Abnormal findings on diagnostic imaging of other specified body structures: Secondary | ICD-10-CM

## 2012-09-15 DIAGNOSIS — Z23 Encounter for immunization: Secondary | ICD-10-CM

## 2012-09-15 DIAGNOSIS — K219 Gastro-esophageal reflux disease without esophagitis: Secondary | ICD-10-CM | POA: Insufficient documentation

## 2012-09-15 DIAGNOSIS — R252 Cramp and spasm: Secondary | ICD-10-CM | POA: Insufficient documentation

## 2012-09-15 DIAGNOSIS — G4733 Obstructive sleep apnea (adult) (pediatric): Secondary | ICD-10-CM

## 2012-09-15 DIAGNOSIS — I82402 Acute embolism and thrombosis of unspecified deep veins of left lower extremity: Secondary | ICD-10-CM

## 2012-09-15 DIAGNOSIS — M79609 Pain in unspecified limb: Secondary | ICD-10-CM | POA: Insufficient documentation

## 2012-09-15 DIAGNOSIS — E785 Hyperlipidemia, unspecified: Secondary | ICD-10-CM

## 2012-09-15 DIAGNOSIS — I82401 Acute embolism and thrombosis of unspecified deep veins of right lower extremity: Secondary | ICD-10-CM

## 2012-09-15 DIAGNOSIS — Z79899 Other long term (current) drug therapy: Secondary | ICD-10-CM | POA: Insufficient documentation

## 2012-09-15 DIAGNOSIS — I824Y9 Acute embolism and thrombosis of unspecified deep veins of unspecified proximal lower extremity: Principal | ICD-10-CM | POA: Insufficient documentation

## 2012-09-15 DIAGNOSIS — R799 Abnormal finding of blood chemistry, unspecified: Secondary | ICD-10-CM | POA: Insufficient documentation

## 2012-09-15 DIAGNOSIS — Z6841 Body Mass Index (BMI) 40.0 and over, adult: Secondary | ICD-10-CM | POA: Insufficient documentation

## 2012-09-15 DIAGNOSIS — I2510 Atherosclerotic heart disease of native coronary artery without angina pectoris: Secondary | ICD-10-CM

## 2012-09-15 DIAGNOSIS — M79604 Pain in right leg: Secondary | ICD-10-CM

## 2012-09-15 DIAGNOSIS — Z299 Encounter for prophylactic measures, unspecified: Secondary | ICD-10-CM

## 2012-09-15 DIAGNOSIS — N939 Abnormal uterine and vaginal bleeding, unspecified: Secondary | ICD-10-CM

## 2012-09-15 DIAGNOSIS — I10 Essential (primary) hypertension: Secondary | ICD-10-CM

## 2012-09-15 DIAGNOSIS — G459 Transient cerebral ischemic attack, unspecified: Secondary | ICD-10-CM

## 2012-09-15 MED ORDER — MORPHINE SULFATE 4 MG/ML IJ SOLN
4.0000 mg | Freq: Once | INTRAMUSCULAR | Status: AC
  Administered 2012-09-16: 4 mg via INTRAVENOUS
  Filled 2012-09-15: qty 1

## 2012-09-15 MED ORDER — ONDANSETRON HCL 4 MG/2ML IJ SOLN
4.0000 mg | Freq: Once | INTRAMUSCULAR | Status: AC
  Administered 2012-09-16: 4 mg via INTRAVENOUS
  Filled 2012-09-15: qty 2

## 2012-09-15 MED ORDER — ENOXAPARIN SODIUM 150 MG/ML ~~LOC~~ SOLN
1.0000 mg/kg | Freq: Once | SUBCUTANEOUS | Status: AC
  Administered 2012-09-16: 135 mg via SUBCUTANEOUS
  Filled 2012-09-15: qty 1

## 2012-09-15 NOTE — ED Notes (Signed)
Accompanied by children from home with complaint of pain on right leg at 10/10 which started at 9:30pm this evening, pt. Also reported of unable  to walk as pain felt , swelling reported to start 3 days ago and was seen by Dr. Nicholos Johns. Alert and oriented x4, denies SOB nor chest pain.

## 2012-09-16 ENCOUNTER — Observation Stay (HOSPITAL_COMMUNITY): Payer: BC Managed Care – PPO

## 2012-09-16 ENCOUNTER — Encounter (HOSPITAL_COMMUNITY): Payer: Self-pay | Admitting: Internal Medicine

## 2012-09-16 DIAGNOSIS — I10 Essential (primary) hypertension: Secondary | ICD-10-CM

## 2012-09-16 DIAGNOSIS — R609 Edema, unspecified: Secondary | ICD-10-CM

## 2012-09-16 DIAGNOSIS — M79609 Pain in unspecified limb: Secondary | ICD-10-CM

## 2012-09-16 DIAGNOSIS — I82409 Acute embolism and thrombosis of unspecified deep veins of unspecified lower extremity: Secondary | ICD-10-CM

## 2012-09-16 HISTORY — DX: Morbid (severe) obesity due to excess calories: E66.01

## 2012-09-16 LAB — D-DIMER, QUANTITATIVE: D-Dimer, Quant: 0.95 ug/mL-FEU — ABNORMAL HIGH (ref 0.00–0.48)

## 2012-09-16 LAB — CBC WITH DIFFERENTIAL/PLATELET
Basophils Absolute: 0.1 10*3/uL (ref 0.0–0.1)
Basophils Relative: 1 % (ref 0–1)
Eosinophils Absolute: 0.2 10*3/uL (ref 0.0–0.7)
Eosinophils Relative: 3 % (ref 0–5)
HCT: 39.4 % (ref 36.0–46.0)
MCH: 26.7 pg (ref 26.0–34.0)
MCHC: 32.2 g/dL (ref 30.0–36.0)
Monocytes Absolute: 0.8 10*3/uL (ref 0.1–1.0)
Monocytes Relative: 8 % (ref 3–12)
Neutro Abs: 5.5 10*3/uL (ref 1.7–7.7)
RDW: 14.1 % (ref 11.5–15.5)

## 2012-09-16 LAB — BASIC METABOLIC PANEL
BUN: 11 mg/dL (ref 6–23)
Calcium: 9 mg/dL (ref 8.4–10.5)
Chloride: 103 mEq/L (ref 96–112)
Creatinine, Ser: 0.63 mg/dL (ref 0.50–1.10)
GFR calc Af Amer: >90 mL/min (ref >90)
GFR calc non Af Amer: 87 mL/min — ABNORMAL LOW (ref >90)

## 2012-09-16 LAB — ANTITHROMBIN III: AntiThromb III Func: 81 % (ref 75–120)

## 2012-09-16 MED ORDER — LISINOPRIL 10 MG PO TABS
10.0000 mg | ORAL_TABLET | Freq: Every day | ORAL | Status: DC
  Administered 2012-09-16 – 2012-09-17 (×2): 10 mg via ORAL
  Filled 2012-09-16 (×2): qty 1

## 2012-09-16 MED ORDER — HYDROCHLOROTHIAZIDE 12.5 MG PO CAPS
12.5000 mg | ORAL_CAPSULE | Freq: Every day | ORAL | Status: DC
  Administered 2012-09-16 – 2012-09-17 (×2): 12.5 mg via ORAL
  Filled 2012-09-16 (×2): qty 1

## 2012-09-16 MED ORDER — ASPIRIN EC 81 MG PO TBEC
81.0000 mg | DELAYED_RELEASE_TABLET | Freq: Every morning | ORAL | Status: DC
Start: 2012-09-16 — End: 2012-09-17
  Administered 2012-09-16 – 2012-09-17 (×2): 81 mg via ORAL
  Filled 2012-09-16 (×2): qty 1

## 2012-09-16 MED ORDER — PROMETHAZINE-CODEINE 6.25-10 MG/5ML PO SYRP: 5.0000 mL | ORAL_SOLUTION | Freq: Four times a day (QID) | ORAL | Status: DC | PRN

## 2012-09-16 MED ORDER — MORPHINE SULFATE 2 MG/ML IJ SOLN
2.0000 mg | INTRAMUSCULAR | Status: DC | PRN
  Administered 2012-09-16 (×3): 2 mg via INTRAVENOUS
  Administered 2012-09-17: 4 mg via INTRAVENOUS
  Filled 2012-09-16: qty 1
  Filled 2012-09-16: qty 2
  Filled 2012-09-16 (×3): qty 1

## 2012-09-16 MED ORDER — LISINOPRIL-HYDROCHLOROTHIAZIDE 10-12.5 MG PO TABS: 1.0000 | ORAL_TABLET | Freq: Every morning | ORAL | Status: DC

## 2012-09-16 MED ORDER — ENOXAPARIN SODIUM 150 MG/ML ~~LOC~~ SOLN
1.0000 mg/kg | Freq: Two times a day (BID) | SUBCUTANEOUS | Status: DC
  Administered 2012-09-16 (×2): 135 mg via SUBCUTANEOUS
  Filled 2012-09-16 (×4): qty 1

## 2012-09-16 MED ORDER — NAPROXEN 250 MG PO TABS
250.0000 mg | ORAL_TABLET | Freq: Two times a day (BID) | ORAL | Status: DC | PRN
  Filled 2012-09-16: qty 1

## 2012-09-16 MED ORDER — ADULT MULTIVITAMIN W/MINERALS CH
1.0000 | ORAL_TABLET | Freq: Every morning | ORAL | Status: DC
  Administered 2012-09-16 – 2012-09-17 (×2): 1 via ORAL
  Filled 2012-09-16 (×2): qty 1

## 2012-09-16 MED ORDER — SODIUM CHLORIDE 0.9 % IJ SOLN
3.0000 mL | Freq: Two times a day (BID) | INTRAMUSCULAR | Status: DC
  Administered 2012-09-16 – 2012-09-17 (×2): 3 mL via INTRAVENOUS

## 2012-09-16 NOTE — ED Provider Notes (Signed)
History     CSN: [id number]  Arrival date & time 09/15/12  2316   First MD Initiated Contact with Patient 09/15/12 2333      Chief Complaint  Patient presents with  . Leg Pain  . unable to walk   . swelling of legs     (Consider location/radiation/quality/duration/timing/severity/associated sxs/prior treatment) HPI 72 year old female presents to emergency department complaining of severe right leg pain.  Pain starts in her hip and goes down to her toes.  It is a 10 of 10.  She, reports she's had bilateral swelling, ongoing for the last 3 days.  She denies previous history of congestive heart failure problems with leg swelling in the past.  She does have history of DVT in 2003.  She was placed on Coumadin, no longer on this.  Patient takes aspirin.  She has history of hypertension, coronary disease, TIAs.  She denies any trauma to the right leg, no falls.  She is unable to walk secondary to pain. Past Medical History  Diagnosis Date  . Hypertension   . Hemorrhoid   . GERD (gastroesophageal reflux disease)   . Adenomatous polyps   . DVT (deep venous thrombosis) 2003    lower extremity DVT with questionable recurrence in 2005. Treated with 1.5 year course of coumadin.  [name] History of TIAs     2004 - no documentation available. 09/2010 -  was not on aspirin or statin therapy prior to event.  . Endometrial thickening on ultra sound 07/2009    path showing Fragments of benign endocervical and squamous mucosa. No dysplasia or malignancy // Followed by Dr. Miguel Aschoff  . Coronary artery disease, non-occlusive 11/2003    LHC (11/2003) - LAD diffuse 30% stenosis of mid-vessel. RCA with 30% stenosis of proximal vessel - by Dr. Andee Lineman. // 2D Echo (04/2006) -  LV EF 65%.  Mild LVH. No  wall motion abnormalities.  Mild diastolic dysfunction.   . Prediabetes DX: 09/2010    HgA1c 6.1    Past Surgical History  Procedure Laterality Date  . Tubal ligation    . Knee surgery  1996    Left knee  arthroscopy.  . Colonoscopy w/ polypectomy  11/01/2007    8 mm rectal adenoma, hemorrhoids  . Rotator cuff repair      right.    Family History  Problem Relation Age of Onset  . Thyroid cancer Sister   . Liver cancer Sister   . Diabetes Mother   . Hyperlipidemia Mother   . Heart disease Mother   . Dementia Mother   . Angina Father   . Miscarriages / Stillbirths Father 76  . Heart disease Brother     x 2 in 36s yo  . Alzheimer's disease Maternal Grandmother     History  Substance Use Topics  . Smoking status: Never Smoker   . Smokeless tobacco: Never Used  . Alcohol Use: No    OB History   Grav Para Term Preterm Abortions TAB SAB Ect Mult Living                  Review of Systems  See History of Present Illness; otherwise all other systems are reviewed and negative Allergies  Review of patient's allergies indicates no known allergies.  Home Medications   Current Outpatient Rx  Name  Route  Sig  Dispense  Refill  . aspirin 81 MG EC tablet   Oral   Take 81 mg by mouth daily.           [name]  Chlorphen-Pseudoephed-APAP (CORICIDIN D PO)   Oral   Take by mouth.         . EXPIRED: esomeprazole (NEXIUM) 40 MG capsule   Oral   Take 1 capsule (40 mg total) by mouth daily. Take 30 minutes before supper   30 capsule   1   . EXPIRED: fluticasone (FLONASE) 50 MCG/ACT nasal spray   Nasal   Place 2 sprays into the nose daily.   16 g   2   . lisinopril-hydrochlorothiazide (PRINZIDE,ZESTORETIC) 10-12.5 MG per tablet   Oral   Take 1 tablet by mouth daily.           . rosuvastatin (CRESTOR) 5 MG tablet   Oral   Take 5 mg by mouth daily.             BP 189/90  Pulse 83  Temp(Src) 98.6 F (37 C) (Oral)  Resp 18  Ht 5\' 4"  (1.626 m)  Wt 297 lb (134.718 kg)  BMI 50.95 kg/m2  SpO2 98%  Physical Exam  Nursing note and vitals reviewed. Constitutional: She is oriented to person, place, and time. She appears well-developed and well-nourished. She appears  distressed (uncomfortable-appearing).  HENT:  Head: Normocephalic and atraumatic.  Nose: Nose normal.  Mouth/Throat: Oropharynx is clear and moist.  Eyes: Conjunctivae and EOM are normal. Pupils are equal, round, and reactive to light.  Neck: Normal range of motion. No JVD present. No tracheal deviation present. No thyromegaly present.  Cardiovascular: Normal rate, regular rhythm, normal heart sounds and intact distal pulses.  Exam reveals no gallop and no friction rub.   No murmur heard. Pulmonary/Chest: Effort normal and breath sounds normal. No stridor. No respiratory distress. She has no wheezes. She has no rales. She exhibits no tenderness.  Abdominal: Soft. Bowel sounds are normal. She exhibits no distension and no mass. There is no tenderness. There is no rebound and no guarding.  Musculoskeletal: Normal range of motion. She exhibits edema (1+ pitting edema to both legs to pretibial areas) and tenderness (diffuse tenderness to right leg.).  Patient has normal pulses, range of motion.  No acute deformity noted  Lymphadenopathy:    She has no cervical adenopathy.  Neurological: She is alert and oriented to person, place, and time. She displays normal reflexes. She exhibits normal muscle tone. Coordination normal.  Skin: Skin is warm and dry. No rash noted. No erythema. No pallor.    ED Course  Procedures (including critical care time)  Labs Reviewed  BASIC METABOLIC PANEL - Abnormal; Notable for the following:    Glucose, Bld 117 (*)    GFR calc non Af Amer 87 (*)    All other components within normal limits  D-DIMER, QUANTITATIVE - Abnormal; Notable for the following:    D-Dimer, Quant 0.95 (*)    All other components within normal limits  ALBUMIN - Abnormal; Notable for the following:    Albumin 3.3 (*)    All other components within normal limits  CBC WITH DIFFERENTIAL  PRO B NATRIURETIC PEPTIDE  SAMPLE TO BLOOD BANK   Dg Chest 2 View  09/16/2012   *RADIOLOGY REPORT*   Clinical Data: Mid back pain and shortness of breath.  CHEST - 2 VIEW  Comparison: 05/23/2011.  Findings: Stable mildly enlarged cardiac silhouette.  Clear lungs with normal vascularity.  The interstitial markings remain mildly prominent.  Thoracic spine degenerative changes.  IMPRESSION:  1.  No acute abnormality. 2.  Stable mild cardiomegaly and mild chronic interstitial lung disease.  Original Report Authenticated By: Beckie Salts, M.D.     1. Right leg pain   2. DVT (deep venous thrombosis), right   3. Hypertension       MDM  72 year old female with right leg pain.  Concern for DVT given her history of same.  Now with acute pain.  We are unable to get duplex ultrasound at this time.  D-dimer is elevated.  Will treat with Lovenox and plan for admission for ultrasound in the morning        Olivia Mackie, MD 09/16/12 [phone]

## 2012-09-16 NOTE — Progress Notes (Signed)
VASCULAR LAB PRELIMINARY  PRELIMINARY  PRELIMINARY  PRELIMINARY  Bilateral lower extremity venous Dopplers completed.    Preliminary report:  There is acute DVT noted in the left posterior tibial vein.  All other veins appear thrombus free.  Larsen Dungan, RVT 09/16/2012, 10:45 AM

## 2012-09-16 NOTE — Progress Notes (Signed)
[facility] PROGRESS NOTE  Shirley [name] ZOX:[id number] DOB: 05-01-1940 DOA: 09/15/2012 PCP: No primary provider on file.  Brief history 72 year old female with a history of DVT in her left popliteal and superficial femoral vein diagnosed on 12/03/2003 after she had left knee surgery. The patient was treated with 6 months of warfarin at that time. According to the patient, the patient had another thromboembolic event in her left lower extremity a few years later. She does not recall when that occurred. The patient stated that she was sure with an additional 3 months of warfarin. Regarding her second thromboembolic events, the patient did not have any surgery or trauma or prior to it. The patient presented last night with a 3 day history of severe right leg and left leg pain primarily from the knee down to the foot. She denies any trauma or recent surgeries. The patient has not been started on any medications except for cephalexin for her recent UTI. The patient has never seen a hematologist in the past. Approximate one month ago, the patient had a cough and dyspnea on exertion. This was treated with antibiotics for presumptive upper respiratory infection. The patient states that her shortness of breath has actually improved. She currently denies any fevers, chills, chest discomfort, shortness of breath, dizziness, diaphoresis, nausea, vomiting, diarrhea, abdominal pain. Assessment/Plan: Right lower extremity pain/intractable pain -Venous Doppler bilateral lower extremity -X-ray right knee -If patient does have DVT,  hypercoagulable workup will be started -Examination of bilateral lower extremities reveals no pain with hip flexion, hip internal and hip external rotation -Pain control Hypertension -Continue lisinopril/hydrochlorothiazide -Continue to monitor Morbid obesity   Family Communication:   Pt at beside Disposition Plan:   Home when medically  stable       Procedures/Studies: Dg Chest 2 View  09/16/2012   *RADIOLOGY REPORT*  Clinical Data: Mid back pain and shortness of breath.  CHEST - 2 VIEW  Comparison: 05/23/2011.  Findings: Stable mildly enlarged cardiac silhouette.  Clear lungs with normal vascularity.  The interstitial markings remain mildly prominent.  Thoracic spine degenerative changes.  IMPRESSION:  1.  No acute abnormality. 2.  Stable mild cardiomegaly and mild chronic interstitial lung disease.   Original Report Authenticated By: Beckie Salts, M.D.         Subjective: Patient continues to complain of pain in the right greater than left lower extremity. Denies any recent trauma or falls. Denies any fevers, chills, chest discomfort, shortness breath, nausea, vomiting, diarrhea, abdominal pain, dysuria.  Objective: Filed Vitals:   09/15/12 2348 09/16/12 0039 09/16/12 0231 09/16/12 0312  BP:  177/64 161/64 160/59  Pulse:  71 68 66  Temp:   97.8 F (36.6 C) 98 F (36.7 C)  TempSrc:   Oral Oral  Resp:  18 17 20   Height: 5\' 4"  (1.626 m)   5\' 4"  (1.626 m)  Weight: 134.718 kg (297 lb)   135.6 kg (298 lb 15.1 oz)  SpO2:  95% 98% 97%   No intake or output data in the 24 hours ending 09/16/12 0814 Weight change:  Exam:   General:  Pt is alert, follows commands appropriately, not in acute distress  HEENT: No icterus, No thrush, Ignacio/AT  Cardiovascular: RRR, S1/S2, no rubs, no gallops  Respiratory: CTA bilaterally, no wheezing, no crackles, no rhonchi  Abdomen: Soft/+BS, non tender, non distended, no guarding  Extremities: 2+ bilateral lower extremity edema, No lymphangitis, No petechiae, No rashes, no synovitis; mild tenderness to knee flexion and extension bilateral. No  pain with hip extension and flexion, no pain with hip internal and external rotation  Data Reviewed: Basic Metabolic Panel:  Recent Labs Lab 09/15/12 2345  NA 136  K 3.8  CL 103  CO2 25  GLUCOSE 117*  BUN 11  CREATININE 0.63   CALCIUM 9.0   Liver Function Tests:  Recent Labs Lab 09/15/12 2345  ALBUMIN 3.3*   No results found for this basename: LIPASE, AMYLASE,  in the last 168 hours No results found for this basename: AMMONIA,  in the last 168 hours CBC:  Recent Labs Lab 09/15/12 2345  WBC 9.5  NEUTROABS 5.5  HGB 12.7  HCT 39.4  MCV 82.8  PLT 216   Cardiac Enzymes: No results found for this basename: CKTOTAL, CKMB, CKMBINDEX, TROPONINI,  in the last 168 hours BNP: No components found with this basename: POCBNP,  CBG: No results found for this basename: GLUCAP,  in the last 168 hours  No results found for this or any previous visit (from the past 240 hour(s)).   Scheduled Meds: . aspirin EC  81 mg Oral q morning - 10a  . enoxaparin (LOVENOX) injection  1 mg/kg Subcutaneous Q12H  . hydrochlorothiazide  12.5 mg Oral Daily  . lisinopril  10 mg Oral Daily  . multivitamin with minerals  1 tablet Oral q morning - 10a  . sodium chloride  3 mL Intravenous Q12H   Continuous Infusions:    Donyel Nester, DO  [facility] Pager [phone]  If 7PM-7AM, please contact night-coverage [email or web address] Password [email or web address] 09/16/2012, 8:14 AM   LOS: 1 day

## 2012-09-16 NOTE — Plan of Care (Signed)
Problem: Consults Goal: Skin Care Protocol Initiated - if Braden Score 18 or less If consults are not indicated, leave blank or document N/A Outcome: Progressing Pt is to reposition herself, keep heels off bed. Pt is able to do this.    Goal: Nutrition Consult-if indicated Outcome: Not Applicable Date Met:  09/16/12 Pt is on heart healthy diet here.  Problem: Phase I Progression Outcomes Goal: Pain controlled with appropriate interventions Outcome: Progressing Pt has pain med ordered and knows when to call for it. Goal: OOB as tolerated unless otherwise ordered Outcome: Not Progressing Pt states very difficult to ambulate now. Has swelling in both legs and poss dvt in left leg. Goal: Initial discharge plan identified Outcome: Progressing Pt states she will be leaving today after they do doppler studies on her.

## 2012-09-16 NOTE — Plan of Care (Signed)
Problem: Consults Goal: General Medical Patient Education See Patient Education Module for specific education. Outcome: Progressing Pt oriented to room and call light, also went over the safety procedures. To call for assistance when needing to get out of bed.

## 2012-09-16 NOTE — H&P (Signed)
[facility] History and Physical  Shirley [name] BJY:[id number] DOB: 10-07-1940 DOA: 09/15/2012  Referring physician: ED PCP: No primary provider on file.  Specialists: None  Chief Complaint: Leg pain and swelling  HPI: Shirley [name] is a 72 y.o. female with PMH of DVT in 2003 not currently on anticoagulation who presents to the ED with severe pain located throughout her right leg.  Symptoms onset 3 days ago, severe 10 out of 10 pain.  There is associated leg swelling as well (although she states swelling is actually BLE).  No trauma to leg.  Recently did have a UTI which she just finished a course of keppra for yesterday.  In the ED her labs showed an elevated D.Dimer and she was started on lovenox, hospitalist has been asked to admit.  Review of Systems: No recent travel or prolonged immobility, 12 systems reviewed and otherwise negative.  Past Medical History  Diagnosis Date  . Hypertension   . Hemorrhoid   . GERD (gastroesophageal reflux disease)   . Adenomatous polyps   . DVT (deep venous thrombosis) 2003    lower extremity DVT with questionable recurrence in 2005. Treated with 1.5 year course of coumadin.  [name] History of TIAs     2004 - no documentation available. 09/2010 -  was not on aspirin or statin therapy prior to event.  . Endometrial thickening on ultra sound 07/2009    path showing Fragments of benign endocervical and squamous mucosa. No dysplasia or malignancy // Followed by Dr. Miguel Aschoff  . Coronary artery disease, non-occlusive 11/2003    LHC (11/2003) - LAD diffuse 30% stenosis of mid-vessel. RCA with 30% stenosis of proximal vessel - by Dr. Andee Lineman. // 2D Echo (04/2006) -  LV EF 65%.  Mild LVH. No  wall motion abnormalities.  Mild diastolic dysfunction.   . Prediabetes DX: 09/2010    HgA1c 6.1   Past Surgical History  Procedure Laterality Date  . Tubal ligation    . Knee surgery  1996    Left knee arthroscopy.  . Colonoscopy w/ polypectomy  11/01/2007   8 mm rectal adenoma, hemorrhoids  . Rotator cuff repair      right.   Social History:  reports that she has never smoked. She has never used smokeless tobacco. She reports that she does not drink alcohol or use illicit drugs.   No Known Allergies  Family History  Problem Relation Age of Onset  . Thyroid cancer Sister   . Liver cancer Sister   . Diabetes Mother   . Hyperlipidemia Mother   . Heart disease Mother   . Dementia Mother   . Angina Father   . Heart disease Brother     x 2 in 13s yo  . Alzheimer's disease Maternal Grandmother     Prior to Admission medications   Medication Sig Start Date End Date Taking? Authorizing Provider  aspirin 81 MG EC tablet Take 81 mg by mouth every morning.    Yes Historical Provider, MD  cephALEXin (KEFLEX) 500 MG capsule Take 500 mg by mouth 3 (three) times daily. 08/03/12  Yes Historical Provider, MD  lisinopril-hydrochlorothiazide (PRINZIDE,ZESTORETIC) 10-12.5 MG per tablet Take 1 tablet by mouth every morning.    Yes Historical Provider, MD  Multiple Vitamin (MULTIVITAMIN WITH MINERALS) TABS Take 1 tablet by mouth every morning.   Yes Historical Provider, MD  naproxen sodium (ANAPROX) 220 MG tablet Take 220 mg by mouth 2 (two) times daily as needed (pain).   Yes  Historical Provider, MD  promethazine-codeine (PHENERGAN WITH CODEINE) 6.25-10 MG/5ML syrup Take 5 mLs by mouth every 6 (six) hours as needed for cough.   Yes Historical Provider, MD   Physical Exam: Filed Vitals:   09/15/12 2322 09/15/12 2348 09/16/12 0039  BP: 189/90  177/64  Pulse: 83  71  Temp: 98.6 F (37 C)    TempSrc: Oral    Resp: 18  18  Height:  5\' 4"  (1.626 m)   Weight:  134.718 kg (297 lb)   SpO2: 98%  95%    General:  NAD but patient is visibly uncomfortable Eyes: PEERLA EOMI ENT: mucous membranes moist Neck: supple w/o JVD Cardiovascular: RRR w/o MRG Respiratory: CTA B Abdomen: soft, nt, nd, bs+ Skin: no rash nor lesion Musculoskeletal: MAE, full ROM all  4 extremities Psychiatric: normal tone and affect Neurologic: AAOx3, grossly non-focal  Labs on Admission:  Basic Metabolic Panel:  Recent Labs Lab 09/15/12 2345  NA 136  K 3.8  CL 103  CO2 25  GLUCOSE 117*  BUN 11  CREATININE 0.63  CALCIUM 9.0   Liver Function Tests:  Recent Labs Lab 09/15/12 2345  ALBUMIN 3.3*   No results found for this basename: LIPASE, AMYLASE,  in the last 168 hours No results found for this basename: AMMONIA,  in the last 168 hours CBC:  Recent Labs Lab 09/15/12 2345  WBC 9.5  NEUTROABS 5.5  HGB 12.7  HCT 39.4  MCV 82.8  PLT 216   Cardiac Enzymes: No results found for this basename: CKTOTAL, CKMB, CKMBINDEX, TROPONINI,  in the last 168 hours  BNP (last 3 results)  Recent Labs  09/15/12 2345  PROBNP 64.6   CBG: No results found for this basename: GLUCAP,  in the last 168 hours  Radiological Exams on Admission: Dg Chest 2 View  09/16/2012   *RADIOLOGY REPORT*  Clinical Data: Mid back pain and shortness of breath.  CHEST - 2 VIEW  Comparison: 05/23/2011.  Findings: Stable mildly enlarged cardiac silhouette.  Clear lungs with normal vascularity.  The interstitial markings remain mildly prominent.  Thoracic spine degenerative changes.  IMPRESSION:  1.  No acute abnormality. 2.  Stable mild cardiomegaly and mild chronic interstitial lung disease.   Original Report Authenticated By: Beckie Salts, M.D.    EKG: Independently reviewed.  Assessment/Plan Principal Problem:   DVT (deep venous thrombosis) Active Problems:   Hypertension   1. Suspected DVT - ultrasound ordered for AM, Lovenox per pharm dosing ordered, will need coumadin added if DVT confirmed.  Did not order hypercoagulable workup as this would be a 2nd DVT and unprovoked and generally an indication for indefinite anticoagulation as such.  Pain control with morphine. 2. HTN - continue home meds    Code Status: Full Code (must indicate code status--if unknown or must be  presumed, indicate so) Family Communication: Spoke with daughter at bedside (indicate person spoken with, if applicable, with phone number if by telephone) Disposition Plan: Admit to obs (indicate anticipated LOS)  Time spent: 50 min  Kael Keetch M. [facility] Pager [phone]  If 7PM-7AM, please contact night-coverage [email or web address] Password [email or web address] 09/16/2012, 1:39 AM

## 2012-09-17 ENCOUNTER — Observation Stay (HOSPITAL_COMMUNITY): Payer: BC Managed Care – PPO

## 2012-09-17 MED ORDER — RIVAROXABAN 15 MG PO TABS
15.0000 mg | ORAL_TABLET | Freq: Two times a day (BID) | ORAL | Status: DC
  Administered 2012-09-17: 15 mg via ORAL
  Filled 2012-09-17 (×3): qty 1

## 2012-09-17 MED ORDER — RIVAROXABAN 20 MG PO TABS: 20.0000 mg | ORAL_TABLET | Freq: Every day | ORAL | Status: DC

## 2012-09-17 MED ORDER — RIVAROXABAN 15 MG PO TABS: 15.0000 mg | ORAL_TABLET | Freq: Two times a day (BID) | ORAL | Status: DC

## 2012-09-17 MED ORDER — HYDROCODONE-ACETAMINOPHEN 5-325 MG PO TABS: 1.0000 | ORAL_TABLET | Freq: Four times a day (QID) | ORAL | Status: DC | PRN

## 2012-09-17 MED ORDER — UNABLE TO FIND: Status: DC

## 2012-09-17 NOTE — Evaluation (Signed)
Physical Therapy Evaluation Patient Details Name: Shirley [name] MRN: [id number] DOB: 1941/02/14 Today's Date: 09/17/2012 Time: 4098-1191 PT Time Calculation (min): 16 min  PT Assessment / Plan / Recommendation Clinical Impression  72 yo female admitted with R LE pain, +L LE DVT. On eval, pt was supervision level for mobility-able to ambulate ~100 feet with RW, although gait is antalgic. Discussed technique for stair negotiation (cane +rail and step to pattern "up with good, down with bad 1st"). Do not feel pt will have any PT needs after discharge. Recommend RW. Will follow during stay.     PT Assessment  Patient needs continued PT services    Follow Up Recommendations  No PT follow up    Does the patient have the potential to tolerate intense rehabilitation      Barriers to Discharge        Equipment Recommendations  Rolling walker with 5" wheels    Recommendations for Other Services     Frequency Min 3X/week    Precautions / Restrictions Precautions Precautions: None Restrictions Weight Bearing Restrictions: No   Pertinent Vitals/Pain 5-6/10 R LE with mobility      Mobility  Bed Mobility Bed Mobility: Supine to Sit Supine to Sit: 6: Modified independent (Device/Increase time) Transfers Transfers: Sit to Stand;Stand to Sit Sit to Stand: 6: Modified independent (Device/Increase time);From bed Stand to Sit: 6: Modified independent (Device/Increase time);To chair/3-in-1 Ambulation/Gait Ambulation/Gait Assistance: 5: Supervision Ambulation Distance (Feet): 100 Feet Assistive device: Rolling walker Ambulation/Gait Assistance Details: VCS safety. slow gait speed.  Gait Pattern: Step-through pattern;Decreased stride length;Antalgic    Exercises     PT Diagnosis: Difficulty walking;Abnormality of gait;Acute pain  PT Problem List: Decreased activity tolerance;Decreased mobility;Pain;Obesity PT Treatment Interventions: DME instruction;Gait training;Stair  training;Functional mobility training;Therapeutic activities;Therapeutic exercise;Patient/family education   PT Goals Acute Rehab PT Goals PT Goal Formulation: With patient/family Time For Goal Achievement: 09/24/12 Potential to Achieve Goals: Good Pt will go Sit to Stand: with modified independence PT Goal: Sit to Stand - Progress: Goal set today Pt will Ambulate: >150 feet;with modified independence;with least restrictive assistive device PT Goal: Ambulate - Progress: Goal set today Pt will Go Up / Down Stairs: 3-5 stairs;with rail(s);with least restrictive assistive device;with supervision PT Goal: Up/Down Stairs - Progress: Goal set today  Visit Information  Last PT Received On: 09/17/12 Assistance Needed: +1    Subjective Data  Subjective: I could not stand on Friday. Im a very independent person.  Patient Stated Goal: home   Prior Functioning  Home Living Lives With: Spouse;Family Type of Home: House Home Access: Stairs to enter Secretary/administrator of Steps: 3-back Entrance Stairs-Rails: Right Home Layout: One level Home Adaptive Equipment: Straight cane Prior Function Level of Independence: Independent with assistive device(s) Able to Take Stairs?: Yes Communication Communication: No difficulties    Cognition  Cognition Arousal/Alertness: Awake/alert Behavior During Therapy: WFL for tasks assessed/performed Overall Cognitive Status: Within Functional Limits for tasks assessed    Extremity/Trunk Assessment Right Lower Extremity Assessment RLE ROM/Strength/Tone: Deficits;Unable to fully assess;Due to pain RLE ROM/Strength/Tone Deficits: pt able to weightbear Left Lower Extremity Assessment LLE ROM/Strength/Tone: [place] for tasks assessed Trunk Assessment Trunk Assessment: Normal   Balance    End of Session PT - End of Session Activity Tolerance: Patient limited by pain Patient left: in chair;with call bell/phone within reach;with family/visitor present   GP Functional Assessment Tool Used: clinical mobility Functional Limitation: Mobility: Walking and moving around Mobility: Walking and Moving Around Current Status ([id number]): At least 1  percent but less than 20 percent impaired, limited or restricted Mobility: Walking and Moving Around Goal Status [phone]): 0 percent impaired, limited or restricted   Rebeca Alert, MPT Pager: [phone]

## 2012-09-17 NOTE — Discharge Summary (Addendum)
Physician Discharge Summary  Shirley [name] WUJ:[id number] DOB: 1940/09/28 DOA: 09/15/2012  PCP: Georgianne Fick, MD  Admit date: 09/15/2012 Discharge date: 09/17/2012  Recommendations for Outpatient Follow-up:  1. Pt will need to follow up with PCP in 2 weeks post discharge 2. Please obtain BMP to evaluate electrolytes and kidney function 3. Please also check CBC to evaluate Hg and Hct levels   Discharge Diagnoses:  Principal Problem:   DVT (deep venous thrombosis) Active Problems:   GERD (gastroesophageal reflux disease)   Hypertension   Coronary artery disease, non-occlusive   Morbid obesity Right lower extremity DVT/intractable pain  -likely due to DJD as revealed by radiographs--see below -Venous Doppler bilateral lower extremity--DVT LEFT posterior popliteal vein -Initial DVT (2005) was provoked, this episode appears unprovoked -X-ray right knee--shows narrowing of the patellofemoral joint space suggestive of DJD -X-ray right hip-Moderate to severe pubic symphysis degenerative change and bilateral SI joint sclerosis, R>L -Pt was given prescription for outpt physical therapy -hypercoagulable workup started --antithrombin III level normal -Will need to followup on antiphospholipid antibody, protein C, protein S, factor V Leiden-- all of which are pending at the time of discharge  -Examination of bilateral lower extremities reveals mild pain with hip flexion, hip internal and hip external rotation  -Pain control--Rx for Norco (5/325) one po q 6hrs prn pain, #30 -Discussed risks, benefits, and alternatives of factor Xa inhibitor versus warfarin  -Patient and the family agreed to start  rivaroxaban and follow treatment protocol -Rivaroxaban 15 mg twice a day x3 weeks, then 20 mg daily  -pt was started on Xarelto in hospital and tolerated it without difficult -Ultimate duration of therapy will be partly dependent upon the patient's hypercoagulable workup, but may be ultimately  indefinite given that this is her second episode of VTE  -Elevated D-Dimer-->likely due to acute leg DVT;  No cp, sob, tachycardia Hypertension  -Continue lisinopril/hydrochlorothiazide  -Continue to monitor  Leg Cramps -instructed pt to try tonic water (approx 1oz--"shot glass" amount) at bedtime Morbid obesity -Physical therapy evaluation did not recommend any further need at home   Discharge Condition: stable  Disposition: home  Diet:heart healthy Wt Readings from Last 3 Encounters:  09/16/12 135.6 kg (298 lb 15.1 oz)  11/02/10 133.176 kg (293 lb 9.6 oz)  09/23/10 132.269 kg (291 lb 9.6 oz)    History of present illness:  72 year old female with a history of DVT in her left popliteal and superficial femoral vein diagnosed on 12/03/2003 after she had left knee surgery. The patient was treated with 6 months of warfarin at that time. According to the patient, the patient had another thromboembolic event in her left lower extremity a few years later. She does not recall when that occurred. The patient stated that she was sure with an additional 3 months of warfarin. Regarding her second thromboembolic events, the patient did not have any surgery or trauma or prior to it. The patient presented last night with a 3 day history of severe right leg and left leg pain primarily from the knee down to the foot. She denies any trauma or recent surgeries. The patient has not been started on any medications except for cephalexin for her recent UTI. The patient has never seen a hematologist in the past. Approximate one month ago, the patient had a cough and dyspnea on exertion. This was treated with antibiotics for presumptive upper respiratory infection. The patient states that her shortness of breath has actually improved. She currently denies any fevers, chills, chest discomfort, shortness  of breath, dizziness, diaphoresis, nausea, vomiting, diarrhea, abdominal pain.       Discharge Exam: Filed  Vitals:   09/17/12 0548  BP: 135/55  Pulse: 69  Temp: 98.1 F (36.7 C)  Resp:    Filed Vitals:   09/16/12 0312 09/16/12 1512 09/16/12 2148 09/17/12 0548  BP: 160/59 145/52 136/63 135/55  Pulse: 66 62 69 69  Temp: 98 F (36.7 C) 97.7 F (36.5 C) 98.1 F (36.7 C) 98.1 F (36.7 C)  TempSrc: Oral Oral Oral Oral  Resp: 20 20 18    Height: 5\' 4"  (1.626 m)     Weight: 135.6 kg (298 lb 15.1 oz)     SpO2: 97% 97% 100% 96%   General: A&O x 3, NAD, pleasant, cooperative Cardiovascular: RRR, no rub, no gallop, no S3 Respiratory: CTAB, no wheeze, no rhonchi Abdomen:soft, nontender, nondistended, positive bowel sounds Extremities: 2+ LE edema, No lymphangitis, no petechiae, mild right hip tenderness with internal and external rotation of the right hip. Right knee without any synovitis.  Discharge Instructions      Discharge Orders   Future Orders Complete By Expires     Diet - low sodium heart healthy  As directed     Discharge instructions  As directed     Comments:      Start taking Xarelto 15mg  twice a day for 21 days--start tonight On October 08, 2012, Start taking Xarelto 20mg  once daily  Stop Aspirin while taking Xarelto unless instructed by your primary care physician to restart    Increase activity slowly  As directed         Medication List    STOP taking these medications       aspirin 81 MG EC tablet     cephALEXin 500 MG capsule  Commonly known as:  KEFLEX     naproxen sodium 220 MG tablet  Commonly known as:  ANAPROX      TAKE these medications       HYDROcodone-acetaminophen 5-325 MG per tablet  Commonly known as:  NORCO  Take 1 tablet by mouth every 6 (six) hours as needed for pain.     lisinopril-hydrochlorothiazide 10-12.5 MG per tablet  Commonly known as:  PRINZIDE,ZESTORETIC  Take 1 tablet by mouth every morning.     multivitamin with minerals Tabs  Take 1 tablet by mouth every morning.     promethazine-codeine 6.25-10 MG/5ML syrup  Commonly  known as:  PHENERGAN with CODEINE  Take 5 mLs by mouth every 6 (six) hours as needed for cough.     Rivaroxaban 15 MG Tabs tablet  Commonly known as:  XARELTO  Take 1 tablet (15 mg total) by mouth 2 (two) times daily with a meal. Take through October 07, 2012     Rivaroxaban 20 MG Tabs  Commonly known as:  XARELTO  Take 1 tablet (20 mg total) by mouth daily with supper. Start October 08, 2012     UNABLE TO FIND  Physical Therapy Evaluate and Treat  --DJD right knee, osteitis pubis, and SI joint arthritis         The results of significant diagnostics from this hospitalization (including imaging, microbiology, ancillary and laboratory) are listed below for reference.    Significant Diagnostic Studies: Dg Chest 2 View  09/16/2012   *RADIOLOGY REPORT*  Clinical Data: Mid back pain and shortness of breath.  CHEST - 2 VIEW  Comparison: 05/23/2011.  Findings: Stable mildly enlarged cardiac silhouette.  Clear lungs with normal vascularity.  The interstitial markings remain mildly prominent.  Thoracic spine degenerative changes.  IMPRESSION:  1.  No acute abnormality. 2.  Stable mild cardiomegaly and mild chronic interstitial lung disease.   Original Report Authenticated By: Beckie Salts, M.D.   Dg Knee 1-2 Views Right  09/16/2012   *RADIOLOGY REPORT*  Clinical Data: Knee pain  RIGHT KNEE - 1-2 VIEW  Comparison: None.  Findings: Two views of the right knee submitted.  Diffuse narrowing of joint space.  No acute fracture or subluxation.  Significant narrowing of patellofemoral joint space.  Mild spurring of patella.  IMPRESSION: No acute fracture or subluxation.  Extensive degenerative changes as described above.   Original Report Authenticated By: Natasha Mead, M.D.     Microbiology: No results found for this or any previous visit (from the past 240 hour(s)).   Labs: Basic Metabolic Panel:  Recent Labs Lab 09/15/12 2345  NA 136  K 3.8  CL 103  CO2 25  GLUCOSE 117*  BUN 11  CREATININE 0.63   CALCIUM 9.0   Liver Function Tests:  Recent Labs Lab 09/15/12 2345  ALBUMIN 3.3*   No results found for this basename: LIPASE, AMYLASE,  in the last 168 hours No results found for this basename: AMMONIA,  in the last 168 hours CBC:  Recent Labs Lab 09/15/12 2345  WBC 9.5  NEUTROABS 5.5  HGB 12.7  HCT 39.4  MCV 82.8  PLT 216   Cardiac Enzymes: No results found for this basename: CKTOTAL, CKMB, CKMBINDEX, TROPONINI,  in the last 168 hours BNP: No components found with this basename: POCBNP,  CBG: No results found for this basename: GLUCAP,  in the last 168 hours  Time coordinating discharge:  Greater than 30 minutes  Signed:  Elishia Kaczorowski, DO [facility] Pager: [phone] 09/17/2012, 12:31 PM

## 2012-09-17 NOTE — Progress Notes (Signed)
Pt given Xarelto teaching information in Spanish and [place]; dgt at bedside and states she will review with patient.

## 2012-09-18 LAB — FACTOR 5 LEIDEN

## 2012-09-19 LAB — ANTIPHOSPHOLIPID SYNDROME EVAL, BLD
Anticardiolipin IgA: 3 APL U/mL — ABNORMAL LOW (ref <22)
Anticardiolipin IgG: 15 GPL U/mL (ref <23)
Lupus Anticoagulant: NOT DETECTED
Phosphatydalserine, IgA: 4 U/mL (ref <20)
Phosphatydalserine, IgG: 0 U/mL (ref <16)
Phosphatydalserine, IgM: 6 U/mL (ref <22)

## 2013-02-19 ENCOUNTER — Other Ambulatory Visit: Payer: Self-pay | Admitting: Oncology

## 2013-02-19 DIAGNOSIS — D689 Coagulation defect, unspecified: Secondary | ICD-10-CM

## 2013-02-20 ENCOUNTER — Telehealth: Payer: Self-pay | Admitting: Oncology

## 2013-02-20 NOTE — Telephone Encounter (Signed)
lvm for pt regarding to NOV adn DEC appt

## 2013-02-22 ENCOUNTER — Other Ambulatory Visit: Payer: BC Managed Care – PPO

## 2013-02-22 ENCOUNTER — Telehealth: Payer: Self-pay | Admitting: Oncology

## 2013-02-22 DIAGNOSIS — D689 Coagulation defect, unspecified: Secondary | ICD-10-CM

## 2013-02-22 NOTE — Telephone Encounter (Signed)
C/D 02/22/13 for appt. 03/21/13

## 2013-02-23 LAB — HYPERCOAGULABLE PANEL, COMPREHENSIVE
Anticardiolipin IgA: 8 APL U/mL (ref <22)
Anticardiolipin IgG: 6 GPL U/mL (ref <23)
Anticardiolipin IgM: 4 MPL U/mL (ref <11)
Beta-2-Glycoprotein I IgA: 0 A Units (ref <20)
PTT Lupus Anticoagulant: 34.6 secs (ref 28.0–43.0)
Protein C, Total: 73 % (ref 72–160)
Protein S Activity: 94 % (ref 69–129)
Protein S Total: 86 % (ref 60–150)

## 2013-03-21 ENCOUNTER — Ambulatory Visit (HOSPITAL_BASED_OUTPATIENT_CLINIC_OR_DEPARTMENT_OTHER): Payer: BC Managed Care – PPO | Admitting: Oncology

## 2013-03-21 VITALS — BP 172/83 | HR 76 | Temp 97.7°F | Resp 20 | Ht 64.0 in | Wt 309.8 lb

## 2013-03-21 DIAGNOSIS — D689 Coagulation defect, unspecified: Secondary | ICD-10-CM | POA: Insufficient documentation

## 2013-03-21 DIAGNOSIS — I82409 Acute embolism and thrombosis of unspecified deep veins of unspecified lower extremity: Secondary | ICD-10-CM

## 2013-03-21 DIAGNOSIS — G459 Transient cerebral ischemic attack, unspecified: Secondary | ICD-10-CM

## 2013-03-21 DIAGNOSIS — R9389 Abnormal findings on diagnostic imaging of other specified body structures: Secondary | ICD-10-CM

## 2013-03-21 NOTE — Progress Notes (Signed)
ID: Shirley [name] OB: 01-26-1941  MR#: [id number]  WUJ#:[id number]  PCP: Georgianne Fick, MD GYN:   SU: benjamin Hoxworth OTHER MD:  CHIEF COMPLAINT: "Should I stay on blood thinner for ever?"  HISTORY OF PRESENT ILLNESS: Shirley [name] has a long history of obesity and sedentary lifestyle. She has had significant knee problems which have kept her from exercising and of course got worse since the weight problem which further worsens the knee problems. In August of 2005 she had right knee surgery and shortly thereafter was diagnosed with a left lower extremity DVT. She was treated with Coumadin for 30 months.   More recently, in June of 2014 she complained of pain in her right leg. There had been no specific provoking event leading to this symptom. Evaluation showed a DVT again in the left leg. She was started on rivaroxaban. She is tolerating the anticoagulation well. She was referred for consideration of lifelong anticoagulation versus other measures.  INTERVAL HISTORY: Lose was seen at the hematology clinic 03/21/2013 accompanied by her daughter [name]  REVIEW OF SYSTEMS: The patient has significant pain in both her legs and both her knees. She also has low back pain. 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 She cannot exercise. She could do water aerobics but does not like the idea [facility] may be present. She is on A. "good diet", but tells me she gained 7 pounds over the last month. She has had no bleeding problems from the anticoagulants. She denies shortness of breath, a catching her breath, pleurisy, or hemoptysis. A detailed review of systems today was otherwise noncontributory  PAST MEDICAL HISTORY: Past Medical History  Diagnosis Date  . Hypertension   . Hemorrhoid   . GERD (gastroesophageal reflux disease)   . Adenomatous polyps   . DVT (deep venous thrombosis) 2003    lower extremity DVT with questionable  recurrence in 2005. Treated with 1.5 year course of coumadin.  [name] History of TIAs     2004 - no documentation available. 09/2010 -  was not on aspirin or statin therapy prior to event.  . Endometrial thickening on ultra sound 07/2009    path showing Fragments of benign endocervical and squamous mucosa. No dysplasia or malignancy // Followed by Dr. Miguel Aschoff  . Coronary artery disease, non-occlusive 11/2003    LHC (11/2003) - LAD diffuse 30% stenosis of mid-vessel. RCA with 30% stenosis of proximal vessel - by Dr. Andee Lineman. // 2D Echo (04/2006) -  LV EF 65%.  Mild LVH. No  wall motion abnormalities.  Mild diastolic dysfunction.   . Prediabetes DX: 09/2010    HgA1c 6.1    PAST SURGICAL HISTORY: Past Surgical History  Procedure Laterality Date  . Tubal ligation    . Knee surgery  1996    Left knee arthroscopy.  . Colonoscopy w/ polypectomy  11/01/2007    8 mm rectal adenoma, hemorrhoids  . Rotator cuff repair      right.    FAMILY HISTORY Family History  Problem Relation Age of Onset  . Thyroid cancer Sister   . Liver cancer Sister   . Diabetes Mother   . Hyperlipidemia Mother   . Heart disease Mother   . Dementia Mother   . Angina Father   . Heart disease Brother     x 2 in 24s yo  . Alzheimer's disease Maternal Grandmother    the patient's father died  from a myocardial infarction at the age of 23. The patient's mother died from complications of COPD at the age of 54. The patient had 5 brothers, 6 sisters. There is a history of scleroderma and polycythemia as well as coronary artery disease in the family. There is no history of hypercoagulable problems in family members  GYNECOLOGIC HISTORY:  Menarche age 89, first live birth age 57. She is GX P6. She went through the change of life in her early 53s. She never took hormone replacement.  SOCIAL HISTORY:  She is a primary school Barrister's clerk. Her husband Phillips Hay is a retired Nurse, adult. Daughter Queen Slough works with  [employer] as does her son [place]. Daughter Marian Sorrow works for the health department in [place]. The patient has 12 grandchildren. She at tends [name]. [employer]    ADVANCED DIRECTIVES:    HEALTH MAINTENANCE: History  Substance Use Topics  . Smoking status: Never Smoker   . Smokeless tobacco: Never Used  . Alcohol Use: No     Colonoscopy:  PAP:  Bone density:  Lipid panel:  No Known Allergies  Current Outpatient Prescriptions  Medication Sig Dispense Refill  . HYDROcodone-acetaminophen (NORCO) 5-325 MG per tablet Take 1 tablet by mouth every 6 (six) hours as needed for pain.  30 tablet  0  . lisinopril-hydrochlorothiazide (PRINZIDE,ZESTORETIC) 10-12.5 MG per tablet Take 1 tablet by mouth every morning.       . Multiple Vitamin (MULTIVITAMIN WITH MINERALS) TABS Take 1 tablet by mouth every morning.      . promethazine-codeine (PHENERGAN WITH CODEINE) 6.25-10 MG/5ML syrup Take 5 mLs by mouth every 6 (six) hours as needed for cough.      . Rivaroxaban (XARELTO) 15 MG TABS tablet Take 1 tablet (15 mg total) by mouth 2 (two) times daily with a meal. Take through October 07, 2012  41 tablet  0  . Rivaroxaban (XARELTO) 20 MG TABS Take 1 tablet (20 mg total) by mouth daily with supper. Start October 08, 2012  30 tablet  2  . UNABLE TO FIND Physical Therapy Evaluate and Treat --DJD right knee, osteitis pubis, and SI joint arthritis  1 Act  0   No current facility-administered medications for this visit.    OBJECTIVE: Middle-aged Latin American woman who appears stated age 72 Vitals:   03/21/13 1609  BP: 172/83  Pulse: 76  Temp: 97.7 F (36.5 C)  Resp: 20     Body mass index is 53.15 kg/(m^2).    ECOG FS:2 - Symptomatic, <50% confined to bed  Ocular: Sclerae unicteric, pupils equal, round and reactive to light Ear-nose-throat: Oropharynx clear, dentition poor Lymphatic: No cervical or supraclavicular adenopathy Lungs no rales or rhonchi, good excursion bilaterally Heart regular  rate and rhythm, no murmur appreciated Abd soft, obese nontender, positive bowel sounds MSK no focal spinal tenderness Neuro: non-focal, well-oriented, appropriate affect Breasts: Deferred   LAB RESULTS: No evidence of a lupus anticoagulant or anticardiolipin antibodies, normal beta-2 glycoprotein antibodies, normal anti-thrombin-3 activity, no factor V Leiden mutation, no protein C or protein S deficiency  CMP     Component Value Date/Time   NA 136 09/15/2012 2345   K 3.8 09/15/2012 2345   CL 103 09/15/2012 2345   CO2 25 09/15/2012 2345   GLUCOSE 117* 09/15/2012 2345   BUN 11 09/15/2012 2345   CREATININE 0.63 09/15/2012 2345   CREATININE 0.69 11/02/2010 1656   CALCIUM 9.0 09/15/2012 2345   PROT 7.0 11/02/2010 1656   ALBUMIN  3.3* 09/15/2012 2345   AST 16 11/02/2010 1656   ALT 15 11/02/2010 1656   ALKPHOS 104 11/02/2010 1656   BILITOT 0.3 11/02/2010 1656   GFRNONAA 87* 09/15/2012 2345   GFRAA >90 09/15/2012 2345    I No results found for this basename: SPEP, UPEP,  kappa and lambda light chains    Lab Results  Component Value Date   WBC 9.5 09/15/2012   NEUTROABS 5.5 09/15/2012   HGB 12.7 09/15/2012   HCT 39.4 09/15/2012   MCV 82.8 09/15/2012   PLT 216 09/15/2012      Chemistry      Component Value Date/Time   NA 136 09/15/2012 2345   K 3.8 09/15/2012 2345   CL 103 09/15/2012 2345   CO2 25 09/15/2012 2345   BUN 11 09/15/2012 2345   CREATININE 0.63 09/15/2012 2345   CREATININE 0.69 11/02/2010 1656      Component Value Date/Time   CALCIUM 9.0 09/15/2012 2345   ALKPHOS 104 11/02/2010 1656   AST 16 11/02/2010 1656   ALT 15 11/02/2010 1656   BILITOT 0.3 11/02/2010 1656       No results found for this basename: LABCA2    No components found with this basename: LABCA125    No results found for this basename: INR,  in the last 168 hours  Urinalysis    Component Value Date/Time   COLORURINE YELLOW 07/16/2009 1427   APPEARANCEUR CLEAR 07/16/2009 1427   LABSPEC <1.005* 07/16/2009 1427    PHURINE 6.0 07/16/2009 1427   GLUCOSEU NEGATIVE 07/16/2009 1427   HGBUR NEGATIVE 07/16/2009 1427   BILIRUBINUR NEGATIVE 07/16/2009 1427   KETONESUR NEGATIVE 07/16/2009 1427   PROTEINUR NEGATIVE 07/16/2009 1427   UROBILINOGEN 0.2 07/16/2009 1427   NITRITE NEGATIVE 07/16/2009 1427   LEUKOCYTESUR NEGATIVE MICROSCOPIC NOT DONE ON URINES WITH NEGATIVE PROTEIN, BLOOD, LEUKOCYTES, NITRITE, OR GLUCOSE <1000 mg/dL. 07/16/2009 1427    STUDIES: ULTRASOUND VENOUS IMAGING UNILATERAL LEFT 12/03/2003 Findings:  Duplex Doppler demonstrates central thrombus within the distal left superficial femoral vein continuing to the popliteal vein which is partially occluded. Flow is seen surrounding the clot. Partial augmentation is visualized in this region with augmentation maneuver. No sonographic evidence for deep venous thrombosis is seen with normal flow, compressibility and augmentation of the left common femoral and left posterior tibial veins. The left greater saphenous vein is patent.  IMPRESSION  1. Partial deep venous thrombosis of the distal superficial femoral and popliteal veins as described.  2. Otherwise negative.   [facility]* *[facility]* [address] [place], [place] [address] [phone]  ------------------------------------------------------------ Noninvasive Vascular Lab  Bilateral Lower Extremity Venous Duplex Evaluation  Patient: Keimora, Swartout MR #: [id number] Study Date: 09/16/2012 Gender: F Age: 26 Height: Weight: BSA: Pt. Status: Room: 1433  SONOGRAPHER Sherren Kerns, RVS ATTENDING Tat, Dia Crawford Tat, Daun Peacock Reports also to:  ------------------------------------------------------------ History and indications:  Indications  729.5 Pain in limb. 729.81 Swelling of limb. History of DVT. History  Diagnostic evaluation.  ------------------------------------------------------------ Study  information:  Portable. Study status: Routine. Procedure: A vascular evaluation was performed with the patient in the supine position. The right common femoral, right femoral, right greater saphenous, right lesser saphenous, right profunda femoral, right popliteal, right peroneal, right posterior tibial, left common femoral, left femoral, left greater saphenous, left lesser saphenous, left profunda femoral, left popliteal, left peroneal, and left posterior tibial veins were studied. Image quality was adequate. Bilateral lower extremity venous duplex evaluation.  Doppler flow study including B-mode compression maneuvers of all visualized segments, color flow Doppler and selected views of pulsed wave Doppler. Location: Bedside. Patient status: Inpatient. Venous flow:  +-----------------+----------+-----------------+-----------+ Location Overall Flow properties Thrombus  +-----------------+----------+-----------------+-----------+ Right common Patent Phasic; ----------- femoral  spontaneous;     compressible   +-----------------+----------+-----------------+-----------+ Right femoral Patent Compressible ----------- +-----------------+----------+-----------------+-----------+ Right profunda Patent Compressible ----------- femoral     +-----------------+----------+-----------------+-----------+ Right popliteal Patent Phasic; -----------   spontaneous;     compressible   +-----------------+----------+-----------------+-----------+ Right posterior Patent Compressible ----------- tibial     +-----------------+----------+-----------------+-----------+ Right peroneal Patent Compressible ----------- +-----------------+----------+-----------------+-----------+ Right Patent Compressible ----------- saphenofemoral     junction     +-----------------+----------+-----------------+-----------+ Right greater  Patent Compressible ----------- saphenous     +-----------------+----------+-----------------+-----------+ Right lesser Patent Compressible ----------- saphenous     +-----------------+----------+-----------------+-----------+ Left common Patent Phasic; ----------- femoral  spontaneous;     compressible   +-----------------+----------+-----------------+-----------+ Left femoral Patent Compressible ----------- +-----------------+----------+-----------------+-----------+ Left profunda Patent Compressible ----------- femoral     +-----------------+----------+-----------------+-----------+ Left popliteal Patent Phasic; -----------   spontaneous;     compressible   +-----------------+----------+-----------------+-----------+ Left posterior ThrombosedNoncompressible Acute  tibial   thrombus  +-----------------+----------+-----------------+-----------+ Left peroneal Patent Compressible ----------- +-----------------+----------+-----------------+-----------+ Left Patent Compressible ----------- saphenofemoral     junction     +-----------------+----------+-----------------+-----------+ Left greater Patent Compressible ----------- saphenous     +-----------------+----------+-----------------+-----------+ Left lesser Patent Compressible ----------- saphenous     +-----------------+----------+-----------------+-----------+  ------------------------------------------------------------ Summary:  - Findings consistent with acute deep vein thrombosis involving the posterior tibial vein of the left lower extremity. - No evidence of Baker's cyst on the right or left. Other specific details can be found in the table(s) above. Prepared and Electronically Authenticated by  Josephina Gip 2014-06-15T08:30:52.    ASSESSMENT: 72 y.o. [place] woman originally from the  [place], with a history of morbid obesity and inactivity,  (1) status post left lower extremity DVT following right knee surgery in 2005, treated with Coumadin for 30 months  (2) recurrent left lower extremity clot 09/16/2012, on rivaroxaban/ Xarelto since that time  (3) negative hypercoagulable workup  PLAN: I spent the better part of today's hour-long visit going over Ms. Paula's situation with her and her daughter. I do not find any additional cause for her hypercoagulable state than her morbid obesity, but that is cause enough. She will continue to be at risk for clots so long as she remains at her current weight and level of inactivity. Accordingly the rivaroxaban should be continued indefinitely.  My strong suggestion to her was that she consider bariatric surgery. I discussed this with her in a general way. If she could lose a significant amount of weight, she might be able to begin an exercise program, possibly water aerobics to start with, and continue with a strict diet, so she could obtain a more reasonable weight after a period of a year perhaps. I am afraid if she does not take drastic measures she is likely to not lift very long, as she has a ready had a stroke and 2 lower extremity DVTs.  The patient has a very good understanding of her situation. She is afraid of surgery but verbalizes that she does not wish to die.. She loves her job, loves her multiple grandchildren, and has significant support from her family. I have referred her to Dr. Glenna Fellows to consider bariatric surgery and I made her a return appointment with me in October of 2015 to see how far she has gotten along that route.  The patient has a good understanding of the overall plan, and agrees with it. She knows to call for any problems that may develop before next visit here.  Lowella Dell, MD   03/21/2013 5:42 PM

## 2013-03-23 ENCOUNTER — Telehealth: Payer: Self-pay | Admitting: Oncology

## 2013-03-23 NOTE — Telephone Encounter (Signed)
, °

## 2013-03-26 NOTE — Addendum Note (Signed)
Addended by: Billey Co on: 03/26/2013 09:10 AM   Modules accepted: Orders, Medications

## 2013-04-05 DIAGNOSIS — I639 Cerebral infarction, unspecified: Secondary | ICD-10-CM

## 2013-04-05 HISTORY — DX: Cerebral infarction, unspecified: I63.9

## 2013-05-28 ENCOUNTER — Ambulatory Visit (INDEPENDENT_AMBULATORY_CARE_PROVIDER_SITE_OTHER): Payer: BC Managed Care – PPO | Admitting: Family Medicine

## 2013-05-28 ENCOUNTER — Emergency Department (HOSPITAL_COMMUNITY): Payer: BC Managed Care – PPO

## 2013-05-28 ENCOUNTER — Encounter (HOSPITAL_COMMUNITY): Payer: Self-pay | Admitting: Emergency Medicine

## 2013-05-28 ENCOUNTER — Emergency Department (HOSPITAL_COMMUNITY)
Admission: EM | Admit: 2013-05-28 | Discharge: 2013-05-28 | Disposition: A | Discharge status: Home / Self Care | Payer: BC Managed Care – PPO | Attending: Emergency Medicine | Admitting: Emergency Medicine

## 2013-05-28 VITALS — BP 146/84 | HR 80 | Temp 98.0°F | Resp 18 | Ht 62.0 in | Wt 307.0 lb

## 2013-05-28 DIAGNOSIS — R51 Headache: Secondary | ICD-10-CM

## 2013-05-28 DIAGNOSIS — R079 Chest pain, unspecified: Secondary | ICD-10-CM

## 2013-05-28 DIAGNOSIS — Z860100 Personal history of colon polyps, unspecified: Secondary | ICD-10-CM | POA: Insufficient documentation

## 2013-05-28 DIAGNOSIS — R202 Paresthesia of skin: Secondary | ICD-10-CM

## 2013-05-28 DIAGNOSIS — E669 Obesity, unspecified: Secondary | ICD-10-CM | POA: Insufficient documentation

## 2013-05-28 DIAGNOSIS — R42 Dizziness and giddiness: Secondary | ICD-10-CM

## 2013-05-28 DIAGNOSIS — R209 Unspecified disturbances of skin sensation: Secondary | ICD-10-CM

## 2013-05-28 DIAGNOSIS — Z8719 Personal history of other diseases of the digestive system: Secondary | ICD-10-CM | POA: Insufficient documentation

## 2013-05-28 DIAGNOSIS — R531 Weakness: Secondary | ICD-10-CM

## 2013-05-28 DIAGNOSIS — Z8673 Personal history of transient ischemic attack (TIA), and cerebral infarction without residual deficits: Secondary | ICD-10-CM | POA: Insufficient documentation

## 2013-05-28 DIAGNOSIS — R200 Anesthesia of skin: Secondary | ICD-10-CM

## 2013-05-28 DIAGNOSIS — Z8639 Personal history of other endocrine, nutritional and metabolic disease: Secondary | ICD-10-CM | POA: Insufficient documentation

## 2013-05-28 DIAGNOSIS — Z79899 Other long term (current) drug therapy: Secondary | ICD-10-CM | POA: Insufficient documentation

## 2013-05-28 DIAGNOSIS — Z86718 Personal history of other venous thrombosis and embolism: Secondary | ICD-10-CM | POA: Insufficient documentation

## 2013-05-28 DIAGNOSIS — Z8742 Personal history of other diseases of the female genital tract: Secondary | ICD-10-CM | POA: Insufficient documentation

## 2013-05-28 DIAGNOSIS — Z862 Personal history of diseases of the blood and blood-forming organs and certain disorders involving the immune mechanism: Secondary | ICD-10-CM | POA: Insufficient documentation

## 2013-05-28 DIAGNOSIS — Z7901 Long term (current) use of anticoagulants: Secondary | ICD-10-CM | POA: Insufficient documentation

## 2013-05-28 DIAGNOSIS — R5381 Other malaise: Secondary | ICD-10-CM

## 2013-05-28 DIAGNOSIS — I2510 Atherosclerotic heart disease of native coronary artery without angina pectoris: Secondary | ICD-10-CM | POA: Insufficient documentation

## 2013-05-28 DIAGNOSIS — I10 Essential (primary) hypertension: Secondary | ICD-10-CM | POA: Insufficient documentation

## 2013-05-28 DIAGNOSIS — R5383 Other fatigue: Secondary | ICD-10-CM

## 2013-05-28 LAB — COMPREHENSIVE METABOLIC PANEL
ALBUMIN: 3.4 g/dL — AB (ref 3.5–5.2)
ALT: 23 U/L (ref 0–35)
AST: 18 U/L (ref 0–37)
Alkaline Phosphatase: 114 U/L (ref 39–117)
BILIRUBIN TOTAL: 0.2 mg/dL — AB (ref 0.3–1.2)
BUN: 17 mg/dL (ref 6–23)
CHLORIDE: 101 meq/L (ref 96–112)
CO2: 26 mEq/L (ref 19–32)
Calcium: 9.4 mg/dL (ref 8.4–10.5)
Creatinine, Ser: 0.64 mg/dL (ref 0.50–1.10)
GFR calc Af Amer: >90 mL/min (ref >90)
GFR, EST NON AFRICAN AMERICAN: 87 mL/min — AB (ref >90)
Glucose, Bld: 113 mg/dL — ABNORMAL HIGH (ref 70–99)
Potassium: 4.1 mEq/L (ref 3.7–5.3)
SODIUM: 140 meq/L (ref 137–147)
Total Protein: 7.2 g/dL (ref 6.0–8.3)

## 2013-05-28 LAB — CBC
HEMATOCRIT: 40.7 % (ref 36.0–46.0)
Hemoglobin: 13.6 g/dL (ref 12.0–15.0)
MCH: 28.5 pg (ref 26.0–34.0)
MCHC: 33.4 g/dL (ref 30.0–36.0)
MCV: 85.1 fL (ref 78.0–100.0)
PLATELETS: 208 10*3/uL (ref 150–400)
RBC: 4.78 MIL/uL (ref 3.87–5.11)
RDW: 14.2 % (ref 11.5–15.5)
WBC: 10.2 10*3/uL (ref 4.0–10.5)

## 2013-05-28 LAB — DIFFERENTIAL
BASOS ABS: 0.1 10*3/uL (ref 0.0–0.1)
Basophils Relative: 1 % (ref 0–1)
Eosinophils Absolute: 0.4 10*3/uL (ref 0.0–0.7)
Eosinophils Relative: 4 % (ref 0–5)
LYMPHS PCT: 39 % (ref 12–46)
Lymphs Abs: 4 10*3/uL (ref 0.7–4.0)
MONO ABS: 0.7 10*3/uL (ref 0.1–1.0)
Monocytes Relative: 7 % (ref 3–12)
NEUTROS ABS: 5.1 10*3/uL (ref 1.7–7.7)
NEUTROS PCT: 50 % (ref 43–77)

## 2013-05-28 LAB — PROTIME-INR
INR: 1.39 (ref 0.00–1.49)
Prothrombin Time: 16.7 seconds — ABNORMAL HIGH (ref 11.6–15.2)

## 2013-05-28 LAB — APTT: aPTT: 35 seconds (ref 24–37)

## 2013-05-28 LAB — I-STAT TROPONIN, ED: Troponin i, poc: 0 ng/mL (ref 0.00–0.08)

## 2013-05-28 NOTE — Discharge Instructions (Signed)
Paresthesia °Paresthesia is an abnormal burning or prickling sensation. This sensation is generally felt in the hands, arms, legs, or feet. However, it may occur in any part of the body. It is usually not painful. The feeling may be described as: °· Tingling or numbness. °· "Pins and needles." °· Skin crawling. °· Buzzing. °· Limbs "falling asleep." °· Itching. °Most people experience temporary (transient) paresthesia at some time in their lives. °CAUSES  °Paresthesia may occur when you breathe too quickly (hyperventilation). It can also occur without any apparent cause. Commonly, paresthesia occurs when pressure is placed on a nerve. The feeling quickly goes away once the pressure is removed. For some people, however, paresthesia is a long-lasting (chronic) condition caused by an underlying disorder. The underlying disorder may be: °· A traumatic, direct injury to nerves. Examples include a: °· Broken (fractured) neck. °· Fractured skull. °· A disorder affecting the brain and spinal cord (central nervous system). Examples include: °· Transverse myelitis. °· Encephalitis. °· Transient ischemic attack. °· Multiple sclerosis. °· Stroke. °· Tumor or blood vessel problems, such as an arteriovenous malformation pressing against the brain or spinal cord. °· A condition that damages the peripheral nerves (peripheral neuropathy). Peripheral nerves are not part of the brain and spinal cord. These conditions include: °· Diabetes. °· Peripheral vascular disease. °· Nerve entrapment syndromes, such as carpal tunnel syndrome. °· Shingles. °· Hypothyroidism. °· Vitamin B12 deficiencies. °· Alcoholism. °· Heavy metal poisoning (lead, arsenic). °· Rheumatoid arthritis. °· Systemic lupus erythematosus. °DIAGNOSIS  °Your caregiver will attempt to find the underlying cause of your paresthesia. Your caregiver may: °· Take your medical history. °· Perform a physical exam. °· Order various lab tests. °· Order imaging tests. °TREATMENT    °Treatment for paresthesia depends on the underlying cause. °HOME CARE INSTRUCTIONS °· Avoid drinking alcohol. °· You may consider massage or acupuncture to help relieve your symptoms. °· Keep all follow-up appointments as directed by your caregiver. °SEEK IMMEDIATE MEDICAL CARE IF:  °· You feel weak. °· You have trouble walking or moving. °· You have problems with speech or vision. °· You feel confused. °· You cannot control your bladder or bowel movements. °· You feel numbness after an injury. °· You faint. °· Your burning or prickling feeling gets worse when walking. °· You have pain, cramps, or dizziness. °· You develop a rash. °MAKE SURE YOU: °· Understand these instructions. °· Will watch your condition. °· Will get help right away if you are not doing well or get worse. °Document Released: 03/12/2002 Document Revised: 06/14/2011 Document Reviewed: 12/11/2010 °ExitCare® Patient Information ©2014 [employer]. ° °

## 2013-05-28 NOTE — Progress Notes (Signed)
Chief Complaint:  Chief Complaint  Patient presents with  . Headache    3 days, hx of stroke  . Numbness    left  . Arm Pain    left    HPI: Shirley [name] is a 73 y.o. female who is here for  HA x 3 days then today after 12 pm she started having numbness and tingling on the leftside of her face, she also has left sided chest pain, sharp 3-4/10 at rest and also left sided shouldre pain. SHe has had shoulder problems before but usually not associated with facial numbness. She has had weakness in her left arm as well. Associated with dizziness, 1 episode of nausea this morning, she has had SOB intermittently. She has had NKI. She has sharp chest pain , 3-4/10 pain. She has not taken anything for this. She has HTN, CAD, minimal XOL , prediabetes. She has had a history of TIA, DVT,  she is on xarelto.Not on statin.  No fevers or URI sxs. She feels a little better being here. Has been taking her BP meds and also taking xarelto regular. She is not on any statins. Nonsmoker.   Past Medical History  Diagnosis Date  . Hypertension   . Hemorrhoid   . GERD (gastroesophageal reflux disease)   . Adenomatous polyps   . DVT (deep venous thrombosis) 2003    lower extremity DVT with questionable recurrence in 2005. Treated with 1.5 year course of coumadin.  [name] History of TIAs     2004 - no documentation available. 09/2010 -  was not on aspirin or statin therapy prior to event.  . Endometrial thickening on ultra sound 07/2009    path showing Fragments of benign endocervical and squamous mucosa. No dysplasia or malignancy // Followed by Dr. [name]  . Coronary artery disease, non-occlusive 11/2003    LHC (11/2003) - LAD diffuse 30% stenosis of mid-vessel. RCA with 30% stenosis of proximal vessel - by Dr. Dannielle Burn. // 2D Echo (04/2006) -  LV EF 65%.  Mild LVH. No  wall motion abnormalities.  Mild diastolic dysfunction.   . Prediabetes DX: 09/2010    HgA1c 6.1  . Stroke    Past Surgical History    Procedure Laterality Date  . Tubal ligation    . Knee surgery  1996    Left knee arthroscopy.  . Colonoscopy w/ polypectomy  11/01/2007    8 mm rectal adenoma, hemorrhoids  . Rotator cuff repair      right.   History   Social History  . Marital Status: Married    Spouse Name: N/A    Number of Children: 6  . Years of Education: College   Occupational History  . Control and instrumentation engineer   . [employer]   Social History Main Topics  . Smoking status: Never Smoker   . Smokeless tobacco: Never Used  . Alcohol Use: No  . Drug Use: No  . Sexual Activity: None   Other Topics Concern  . None   Social History Narrative   Full Code status.   Insurance: BCBS   Family History  Problem Relation Age of Onset  . Thyroid cancer Sister   . Liver cancer Sister   . Diabetes Mother   . Hyperlipidemia Mother   . Heart disease Mother   . Dementia Mother   . Angina Father   . Heart disease Brother     x 2 in 58s  yo  . Alzheimer's disease Maternal Grandmother    No Known Allergies Prior to Admission medications   Medication Sig Start Date End Date Taking? Authorizing Provider  AFLURIA PRESERVATIVE FREE injection  01/29/13  Yes Historical Provider, MD  lisinopril-hydrochlorothiazide (PRINZIDE,ZESTORETIC) 10-12.5 MG per tablet Take 1 tablet by mouth every morning.    Yes Historical Provider, MD  Multiple Vitamin (MULTIVITAMIN WITH MINERALS) TABS Take 1 tablet by mouth every morning.   Yes Historical Provider, MD  Rivaroxaban (XARELTO) 15 MG TABS tablet Take 1 tablet (15 mg total) by mouth 2 (two) times daily with a meal. Take through October 07, 2012 09/17/12  Yes Orson Eva, MD  HYDROcodone-acetaminophen (NORCO) 5-325 MG per tablet Take 1 tablet by mouth every 6 (six) hours as needed for pain. 09/17/12   Orson Eva, MD     ROS: The patient denies fevers, chills, night sweats, unintentional weight loss, palpitations, vomiting, abdominal pain, dysuria,  hematuria, melena, + numbness, weakness, or tingling, SOB, nausea, clamminess  All other systems have been reviewed and were otherwise negative with the exception of those mentioned in the HPI and as above.    PHYSICAL EXAM: Filed Vitals:   05/28/13 1719  BP: 146/84  Pulse: 80  Temp: 98 F (36.7 C)  Resp: 18  Spo2   97% Filed Vitals:   05/28/13 1719  Height: 5\' 2"  (1.575 m)  Weight: 307 lb (139.254 kg)   Body mass index is 56.14 kg/(m^2).  General: Alert, no acute distress, moribidly obese female HEENT:  Normocephalic, atraumatic, oropharynx patent. EOMI, PERRLA, fundoscopic  exam grossly nl Cardiovascular:  Regular rate and rhythm, no rubs murmurs or gallops.  No Carotid bruits, radial pulse intact. No pedal edema.  Respiratory: Clear to auscultation bilaterally.  No wheezes, rales, or rhonchi.  No cyanosis, no use of accessory musculature GI: No organomegaly, abdomen is soft and non-tender, positive bowel sounds.  No masses. Skin: No rashes. Neurologic: Facial musculature symmetric. + Left sided UE 4/5 weakness on empty can and grip strength. Facial senstation  intact Psychiatric: Patient is appropriate throughout our interaction. Lymphatic: No cervical lymphadenopathy    LABS: Results for orders placed in visit on 02/22/13  HYPERCOAGULABLE PANEL, COMPREHENSIVE      Result Value Ref Range   AntiThromb III Func 97  76 - 126 %   Protein C, Total 73  72 - 160 %   Protein S Total 86  60 - 150 %   PTT Lupus Anticoagulant 34.6  28.0 - 43.0 secs   PTTLA 4:1 Mix NOT APPL  28.0 - 43.0 secs   PTTLA Confirmation NOT APPL  <8.0 secs   DRVVT 32.6  <42.9 secs   DRVVT 1:1 Mix NOT APPL  <42.9 secs   Drvvt confirmation NOT APPL  <1.11 Ratio   Lupus Anticoagulant NOT DETECTED  NOT DETECTED   Beta-2 Glyco I IgG 9  <20 G Units   Beta-2-Glycoprotein I IgM 13  <20 M Units   Beta-2-Glycoprotein I IgA 0  <20 A Units   Anticardiolipin IgG 6  <23 GPL U/mL   Anticardiolipin IgM 4  <11 MPL  U/mL   Anticardiolipin IgA 8  <22 APL U/mL   Recommendations-F5LEID: *     Protein C Activity 132  75 - 133 %   Protein S Activity 94  69 - 129 %   Recommendations-PTGENE: *       EKG/XRAY:   Primary read interpreted by Dr. Marin Comment at [facility]. EKG sinus rhythm 71 bpm,  no acute ST changes   ASSESSMENT/PLAN: Encounter Diagnoses  Name Primary?  . Left facial numbness Yes  . Weakness   . Headache(784.0)   . Dizziness and giddiness   . Chest pain    Rule out TIA/CVA, CP due to sxs and risk factors Spoke with  ER charge nurse and also daughter, going via EMS.  Send to [facility] ER for further evaluation  F/u prn  Gross sideeffects, risk and benefits, and alternatives of medications d/w patient. Patient is aware that all medications have potential sideeffects and we are unable to predict every sideeffect or drug-drug interaction that may occur.  LE, Beverly, DO 05/28/2013 6:00 PM

## 2013-05-28 NOTE — ED Provider Notes (Signed)
CSN: [id number]     Arrival date & time 05/28/13  1832 History   First MD Initiated Contact with Patient 05/28/13 1840     CC:  Headache and weakness  HPI Pt is having trouble with headache and arm numbness.  The headache started a few days ago.  Started on the right side and now has been the whole head.  Today she started developing tingling in the left hand.  She also felt that it was a little bit weak.  She noticed that at 12 am she could not get her fingers to move properly.  She also felt it shaking.  Pt had a stroke in the past and was worried that she was having another stroke.  No trouble with speech or balance.  Pt denies any complaints of chest pain .  Past Medical History  Diagnosis Date  . Hypertension   . Hemorrhoid   . GERD (gastroesophageal reflux disease)   . Adenomatous polyps   . DVT (deep venous thrombosis) 2003    lower extremity DVT with questionable recurrence in 2005. Treated with 1.5 year course of coumadin.  [name] History of TIAs     2004 - no documentation available. 09/2010 -  was not on aspirin or statin therapy prior to event.  . Endometrial thickening on ultra sound 07/2009    path showing Fragments of benign endocervical and squamous mucosa. No dysplasia or malignancy // Followed by Dr. [name]  . Coronary artery disease, non-occlusive 11/2003    LHC (11/2003) - LAD diffuse 30% stenosis of mid-vessel. RCA with 30% stenosis of proximal vessel - by Dr. Dannielle Burn. // 2D Echo (04/2006) -  LV EF 65%.  Mild LVH. No  wall motion abnormalities.  Mild diastolic dysfunction.   . Prediabetes DX: 09/2010    HgA1c 6.1  . Stroke    Past Surgical History  Procedure Laterality Date  . Tubal ligation    . Knee surgery  1996    Left knee arthroscopy.  . Colonoscopy w/ polypectomy  11/01/2007    8 mm rectal adenoma, hemorrhoids  . Rotator cuff repair      right.   Family History  Problem Relation Age of Onset  . Thyroid cancer Sister   . Liver cancer Sister   . Diabetes  Mother   . Hyperlipidemia Mother   . Heart disease Mother   . Dementia Mother   . Angina Father   . Heart disease Brother     x 2 in 29s yo  . Alzheimer's disease Maternal Grandmother    History  Substance Use Topics  . Smoking status: Never Smoker   . Smokeless tobacco: Never Used  . Alcohol Use: No   OB History   Grav Para Term Preterm Abortions TAB SAB Ect Mult Living                 Review of Systems  Constitutional: Negative for diaphoresis (in the mornings).  All other systems reviewed and are negative.      Allergies  Review of patient's allergies indicates no known allergies.  Home Medications   Current Outpatient Rx  Name  Route  Sig  Dispense  Refill  . HYDROcodone-acetaminophen (NORCO) 5-325 MG per tablet   Oral   Take 1 tablet by mouth every 6 (six) hours as needed for pain.   30 tablet   0   . lisinopril-hydrochlorothiazide (PRINZIDE,ZESTORETIC) 10-12.5 MG per tablet   Oral   Take 1 tablet by mouth  every morning.          . Multiple Vitamin (MULTIVITAMIN WITH MINERALS) TABS   Oral   Take 1 tablet by mouth every morning.         . naproxen sodium (ANAPROX) 220 MG tablet   Oral   Take 220 mg by mouth daily as needed. For pain         . Rivaroxaban (XARELTO) 15 MG TABS tablet   Oral   Take 1 tablet (15 mg total) by mouth 2 (two) times daily with a meal. Take through October 07, 2012   41 tablet   0    BP 130/71  Pulse 72  Temp(Src) 97.8 F (36.6 C) (Oral)  Resp 16  SpO2 97% Physical Exam  Nursing note and vitals reviewed. Constitutional: She is oriented to person, place, and time. No distress.  Obese   HENT:  Head: Normocephalic and atraumatic.  Right Ear: External ear normal.  Left Ear: External ear normal.  Mouth/Throat: Oropharynx is clear and moist.  Eyes: Conjunctivae are normal. Right eye exhibits no discharge. Left eye exhibits no discharge. No scleral icterus.  Neck: Neck supple. No tracheal deviation present.   Cardiovascular: Normal rate, regular rhythm and intact distal pulses.   Pulmonary/Chest: Effort normal and breath sounds normal. No stridor. No respiratory distress. She has no wheezes. She has no rales.  Abdominal: Soft. Bowel sounds are normal. She exhibits no distension. There is no tenderness. There is no rebound and no guarding.  Musculoskeletal: She exhibits no edema and no tenderness.  Neurological: She is alert and oriented to person, place, and time. She has normal strength. No cranial nerve deficit (No facial droop, extraocular movements intact, tongue midline ) or sensory deficit. She exhibits normal muscle tone. She displays no seizure activity. Coordination normal.  No pronator drift bilateral upper extrem, able to hold both legs off bed for 5 seconds, sensation intact in all extremities, no visual field cuts, no left or right sided neglect, normal finger-nose exam bilaterally, no nystagmus noted   Skin: Skin is warm and dry. No rash noted.  Psychiatric: She has a normal mood and affect.    ED Course  Procedures (including critical care time) Labs Review Labs Reviewed  PROTIME-INR - Abnormal; Notable for the following:    Prothrombin Time 16.7 (*)    All other components within normal limits  COMPREHENSIVE METABOLIC PANEL - Abnormal; Notable for the following:    Glucose, Bld 113 (*)    Albumin 3.4 (*)    Total Bilirubin 0.2 (*)    GFR calc non Af Amer 87 (*)    All other components within normal limits  APTT  CBC  DIFFERENTIAL  I-STAT TROPOININ, ED   Imaging Review Ct Head Wo Contrast  05/28/2013   CLINICAL DATA:  Left-sided headache  EXAM: CT HEAD WITHOUT CONTRAST  TECHNIQUE: Contiguous axial images were obtained from the base of the skull through the vertex without intravenous contrast.  COMPARISON:  MR MRA HEAD W/O CM dated 09/13/2010  FINDINGS: No acute intracranial hemorrhage. No focal mass lesion. No CT evidence of acute infarction. No midline shift or mass effect.  No hydrocephalus. Basilar cisterns are patent. Insert mass is  IMPRESSION: No acute intracranial findings.  No change from prior.   Electronically Signed   By: Suzy Bouchard M.D.   On: 05/28/2013 20:05    EKG Interpretation    Date/Time:  Monday May 28 2013 19:00:19 EST Ventricular Rate:  73 PR Interval:  227 QRS Duration: 118 QT Interval:  392 QTC Calculation: 432 R Axis:   -48 Text Interpretation:  Sinus rhythm Prolonged PR interval Left anterior fascicular block Low voltage, precordial leads artifact No significant change since last tracing Confirmed by Brian Zeitlin  MD-J, Ranny Wiebelhaus ([id number]) on 05/28/2013 7:05:05 PM            MDM   Final diagnoses:  Numbness and tingling in hands    Symptoms are atypical.  Doubt ACS.  Also complains of some numbness in her hand earlier but exam in the ED is normal.  Pt has appointment with pcp in 2 days.  At this time there does not appear to be any evidence of an acute emergency medical condition and the patient appears stable for discharge with appropriate outpatient follow up.    Kathalene Frames, MD 05/28/13 [phone]

## 2013-05-28 NOTE — ED Notes (Signed)
Patient transported to CT 

## 2013-05-28 NOTE — ED Notes (Signed)
Pt to ED via [facility] from Urgent Care with c/o left arm pain.  EMS reports pt stated she had left facial numbness this am that has subsided.  Also st's she has chronic chest pain and she has chest pain everyday but usually does not have arm pain.

## 2013-07-11 ENCOUNTER — Encounter: Payer: BC Managed Care – PPO | Attending: Internal Medicine | Admitting: Dietician

## 2013-07-11 ENCOUNTER — Encounter: Payer: Self-pay | Admitting: Dietician

## 2013-07-11 DIAGNOSIS — Z713 Dietary counseling and surveillance: Secondary | ICD-10-CM | POA: Insufficient documentation

## 2013-07-11 DIAGNOSIS — Z6841 Body Mass Index (BMI) 40.0 and over, adult: Secondary | ICD-10-CM | POA: Insufficient documentation

## 2013-07-11 DIAGNOSIS — E119 Type 2 diabetes mellitus without complications: Secondary | ICD-10-CM | POA: Insufficient documentation

## 2013-07-11 DIAGNOSIS — E669 Obesity, unspecified: Secondary | ICD-10-CM | POA: Insufficient documentation

## 2013-07-11 NOTE — Progress Notes (Signed)
  Medical Nutrition Therapy:  Appt start time: 0800 end time:  0900.   Assessment:  Primary concerns today: Shirley [name] is here today since since she was newly diagnosed with diabetes and she would like to lose weight to help manage blood sugar. Last Hgb A1c 6.8% in February (up from 6.5%) and had prediabetes for awhile before that. Not currently testing blood sugar or taking medication and doctor suggested she try to lose weight.   Having chest pains recently when tried exercising. Doctor does not want her to exercise until she loses some weight. Planning to have a cardiac catheterization over the summer.    Would like to lose 40 lbs before July. Started cutting back on rice, potatoes, bread, corn, grapes, apples, fried food, banana, or green plantain. Also started eating smaller portion sizes. Lost about 10 lbs in the past 6 weeks.   Lives with her husband and daughter and states that her husband does the food preparation and shopping. Works at a school.   Preferred Learning Style:   No preference indicated   Learning Readiness:   Ready  MEDICATIONS: see list   DIETARY INTAKE:  Avoided foods include: pasta  24-hr recall:  B ( AM): boiled egg or whole wheat toast with peanut butter or cereal with 2% milk with green tea Snk ( AM): black coffee with fruit or almonds L ( PM): salad with no dressing and sometimes cottage cheese Snk ( PM): sometimes will have fruit or almonds D ( PM): vegetables, plantains, yuca, fish or chicken  Snk ( PM): none Beverages: water or green tea  Usual physical activity: not cleared to exercise at this time  Estimated energy needs: 1600 calories 180 g carbohydrates 120 g protein 44 g fat  Progress Towards Goal(s):  In progress.   Nutritional Diagnosis:  [place]-3.3 Overweight/obesity As related to hx of excess carbohydrates and physical inactivity.  As evidenced by BMI of 51.1 and Hgb A1c of 6.8%.    Intervention:  Nutrition counseling provided. Discussed  the pathophysiology of insulin resistance and factors that affect blood glucose such as consumption of carbohydrate foods and physical activity.  Plan: Fill half of you plate with vegetables and limit starches or other carbs to a quarter of the plate. Have protein with carbs for all meals and snacks.  For breakfast - have protein such as eggs, cottage cheese, or peanut butter with starches such as bread, oatmeal, or cereal. For lunch add some nuts, meat, or cheese to salad and carbohydrate such as fruit, bread, or crackers. Think about using smaller plate or bowls to help with portion size.  Consider counting out nuts to help with portion (mix cashews with almonds).   Teaching Method Utilized:  Visual Auditory Hands on  Handouts given during visit include:  Living Well with Diabetes  MyPlate Handout  15 g CHO Snacks  Barriers to learning/adherence to lifestyle change: not able to exercise at this time pending cardiac catheterization   Demonstrated degree of understanding via:  Teach Back   Monitoring/Evaluation:  Dietary intake, exercise, and body weight in 3 month(s).

## 2013-07-11 NOTE — Patient Instructions (Signed)
Fill half of you plate with vegetables and limit starches or other carbs to a quarter of the plate. Have protein with carbs for all meals and snacks.  For breakfast - have protein such as eggs, cottage cheese, or peanut butter with starches such as bread, oatmeal, or cereal. For lunch add some nuts, meat, or cheese to salad and carbohydrate such as fruit, bread, or crackers. Think about using smaller plate or bowls to help with portion size.  Consider counting out nuts to help with portion (mix cashews with almonds).  

## 2013-10-10 ENCOUNTER — Encounter: Payer: BC Managed Care – PPO | Attending: Internal Medicine | Admitting: Dietician

## 2013-10-10 DIAGNOSIS — Z6841 Body Mass Index (BMI) 40.0 and over, adult: Secondary | ICD-10-CM | POA: Insufficient documentation

## 2013-10-10 DIAGNOSIS — Z713 Dietary counseling and surveillance: Secondary | ICD-10-CM | POA: Insufficient documentation

## 2013-10-10 DIAGNOSIS — E669 Obesity, unspecified: Secondary | ICD-10-CM | POA: Insufficient documentation

## 2013-10-10 DIAGNOSIS — E119 Type 2 diabetes mellitus without complications: Secondary | ICD-10-CM | POA: Insufficient documentation

## 2013-10-10 NOTE — Patient Instructions (Signed)
Fill half of you plate with vegetables and limit starches or other carbs to a quarter of the plate. Have protein with carbs for all meals and snacks.  For breakfast - have protein such as eggs, cottage cheese, or peanut butter with starches such as bread, oatmeal, or cereal. For lunch add some nuts, meat, or cheese to salad and carbohydrate such as fruit, bread, or crackers. Think about using smaller plate or bowls to help with portion size.  Consider counting out nuts to help with portion (mix cashews with almonds).

## 2013-10-10 NOTE — Progress Notes (Signed)
  Medical Nutrition Therapy:  Appt start time: 0720 end time:  0745.  Assessment:  Primary concerns today: Shirley [name] is here today for a follow up for obesity/newly diagnosed with diabetes. Lost 13 lbs since April. Doing some light walking but still not cleared to for intense exercise until she loses more weight. Hgb A1c is 6.1% in June per patient's daughter in June (down from 6.8%). Cholesterol has also improved.   Overall watching portions to help with weight. Doctor would like her to do Medifast which is run out of their office to help promote more aggressive weight loss.   Wt Readings from Last 3 Encounters:  10/10/13 284 lb 9.6 oz (129.094 kg)  07/11/13 297 lb 8 oz (134.945 kg)  05/28/13 307 lb (139.254 kg)   Ht Readings from Last 3 Encounters:  10/10/13 5\' 3"  (1.6 m)  07/11/13 5\' 4"  (1.626 m)  05/28/13 5\' 2"  (1.575 m)   Body mass index is 50.43 kg/(m^2). @BMIFA @ Normalized weight-for-age data available only for age 54 to 32 years. Normalized stature-for-age data available only for age 54 to 15 years.   Preferred Learning Style:   No preference indicated   Learning Readiness:   Ready  MEDICATIONS: see list   DIETARY INTAKE:  Avoided foods include: pasta  24-hr recall:  B ( AM): boiled egg or whole wheat toast with orange, sometimes with  green tea Snk ( AM): black coffee with fruit or almonds sometimes L ( PM): salad with no dressing and sometimes cottage cheese Snk ( PM): sometimes will have fruit (grapes or plum) D ( PM): vegetables, plantains, yuca, fish or chicken, meat, pasta Snk ( PM): none Beverages: water or green tea, coffee  Usual physical activity: walking 12-20 minutes everyday  Estimated energy needs: 1600 calories 180 g carbohydrates 120 g protein 44 g fat  Progress Towards Goal(s):  In progress.   Nutritional Diagnosis:  [place]-3.3 Overweight/obesity As related to hx of excess carbohydrates and physical inactivity.  As evidenced by BMI of 51.1 and  Hgb A1c of 6.8%.    Intervention:  Nutrition counseling provided. Encouraged Shirley [name] to keep up the good work and encouraged her to try Medifast plan to help with more aggressive weight loss.   Teaching Method Utilized:  Visual Auditory Hands on  Handouts given during visit include:  Living Well with Diabetes  MyPlate Handout  15 g CHO Snacks  Barriers to learning/adherence to lifestyle change: not able to exercise intensely at this time   Demonstrated degree of understanding via:  Teach Back   Monitoring/Evaluation:  Dietary intake, exercise, and body weight prn.

## 2013-12-18 ENCOUNTER — Inpatient Hospital Stay (HOSPITAL_COMMUNITY)
Admission: EM | Admit: 2013-12-18 | Discharge: 2013-12-22 | DRG: 300 | Disposition: A | Discharge status: Home / Self Care | Payer: BC Managed Care – PPO | Attending: Internal Medicine | Admitting: Internal Medicine

## 2013-12-18 ENCOUNTER — Encounter (HOSPITAL_COMMUNITY): Payer: Self-pay | Admitting: Radiology

## 2013-12-18 ENCOUNTER — Encounter (HOSPITAL_COMMUNITY): Payer: Self-pay | Admitting: Emergency Medicine

## 2013-12-18 ENCOUNTER — Emergency Department (HOSPITAL_COMMUNITY): Payer: BC Managed Care – PPO

## 2013-12-18 ENCOUNTER — Emergency Department (INDEPENDENT_AMBULATORY_CARE_PROVIDER_SITE_OTHER)
Admission: EM | Admit: 2013-12-18 | Discharge: 2013-12-18 | Disposition: A | Discharge status: Nursing Home (Includes ICF) | Payer: BC Managed Care – PPO | Source: Home / Self Care | Attending: Emergency Medicine | Admitting: Emergency Medicine

## 2013-12-18 DIAGNOSIS — Z833 Family history of diabetes mellitus: Secondary | ICD-10-CM

## 2013-12-18 DIAGNOSIS — R911 Solitary pulmonary nodule: Secondary | ICD-10-CM | POA: Diagnosis present

## 2013-12-18 DIAGNOSIS — Z23 Encounter for immunization: Secondary | ICD-10-CM | POA: Diagnosis not present

## 2013-12-18 DIAGNOSIS — Z800 Family history of malignant neoplasm of digestive organs: Secondary | ICD-10-CM | POA: Diagnosis not present

## 2013-12-18 DIAGNOSIS — S0990XA Unspecified injury of head, initial encounter: Secondary | ICD-10-CM | POA: Diagnosis not present

## 2013-12-18 DIAGNOSIS — I10 Essential (primary) hypertension: Secondary | ICD-10-CM

## 2013-12-18 DIAGNOSIS — I824Z9 Acute embolism and thrombosis of unspecified deep veins of unspecified distal lower extremity: Principal | ICD-10-CM | POA: Diagnosis present

## 2013-12-18 DIAGNOSIS — Z6841 Body Mass Index (BMI) 40.0 and over, adult: Secondary | ICD-10-CM

## 2013-12-18 DIAGNOSIS — Z886 Allergy status to analgesic agent status: Secondary | ICD-10-CM | POA: Diagnosis not present

## 2013-12-18 DIAGNOSIS — I2510 Atherosclerotic heart disease of native coronary artery without angina pectoris: Secondary | ICD-10-CM

## 2013-12-18 DIAGNOSIS — R404 Transient alteration of awareness: Secondary | ICD-10-CM | POA: Diagnosis not present

## 2013-12-18 DIAGNOSIS — I499 Cardiac arrhythmia, unspecified: Secondary | ICD-10-CM | POA: Diagnosis present

## 2013-12-18 DIAGNOSIS — G4730 Sleep apnea, unspecified: Secondary | ICD-10-CM | POA: Diagnosis present

## 2013-12-18 DIAGNOSIS — E785 Hyperlipidemia, unspecified: Secondary | ICD-10-CM | POA: Diagnosis present

## 2013-12-18 DIAGNOSIS — R059 Cough, unspecified: Secondary | ICD-10-CM | POA: Diagnosis not present

## 2013-12-18 DIAGNOSIS — Z8249 Family history of ischemic heart disease and other diseases of the circulatory system: Secondary | ICD-10-CM

## 2013-12-18 DIAGNOSIS — Z8673 Personal history of transient ischemic attack (TIA), and cerebral infarction without residual deficits: Secondary | ICD-10-CM

## 2013-12-18 DIAGNOSIS — N390 Urinary tract infection, site not specified: Secondary | ICD-10-CM | POA: Diagnosis present

## 2013-12-18 DIAGNOSIS — E860 Dehydration: Secondary | ICD-10-CM | POA: Diagnosis present

## 2013-12-18 DIAGNOSIS — R55 Syncope and collapse: Secondary | ICD-10-CM

## 2013-12-18 DIAGNOSIS — Z7901 Long term (current) use of anticoagulants: Secondary | ICD-10-CM | POA: Diagnosis not present

## 2013-12-18 DIAGNOSIS — K219 Gastro-esophageal reflux disease without esophagitis: Secondary | ICD-10-CM | POA: Diagnosis present

## 2013-12-18 DIAGNOSIS — Z808 Family history of malignant neoplasm of other organs or systems: Secondary | ICD-10-CM

## 2013-12-18 DIAGNOSIS — I82409 Acute embolism and thrombosis of unspecified deep veins of unspecified lower extremity: Secondary | ICD-10-CM | POA: Diagnosis present

## 2013-12-18 DIAGNOSIS — Z86718 Personal history of other venous thrombosis and embolism: Secondary | ICD-10-CM | POA: Diagnosis not present

## 2013-12-18 DIAGNOSIS — R7309 Other abnormal glucose: Secondary | ICD-10-CM | POA: Diagnosis present

## 2013-12-18 DIAGNOSIS — R05 Cough: Secondary | ICD-10-CM | POA: Diagnosis not present

## 2013-12-18 DIAGNOSIS — I82403 Acute embolism and thrombosis of unspecified deep veins of lower extremity, bilateral: Secondary | ICD-10-CM

## 2013-12-18 DIAGNOSIS — N2889 Other specified disorders of kidney and ureter: Secondary | ICD-10-CM | POA: Diagnosis not present

## 2013-12-18 DIAGNOSIS — R079 Chest pain, unspecified: Secondary | ICD-10-CM | POA: Diagnosis not present

## 2013-12-18 DIAGNOSIS — R739 Hyperglycemia, unspecified: Secondary | ICD-10-CM

## 2013-12-18 DIAGNOSIS — D689 Coagulation defect, unspecified: Secondary | ICD-10-CM

## 2013-12-18 HISTORY — DX: Syncope and collapse: R55

## 2013-12-18 LAB — CBC WITH DIFFERENTIAL/PLATELET
BASOS PCT: 1 % (ref 0–1)
Basophils Absolute: 0.1 10*3/uL (ref 0.0–0.1)
EOS PCT: 4 % (ref 0–5)
Eosinophils Absolute: 0.6 10*3/uL (ref 0.0–0.7)
HEMATOCRIT: 38.4 % (ref 36.0–46.0)
Hemoglobin: 12.7 g/dL (ref 12.0–15.0)
Lymphocytes Relative: 53 % — ABNORMAL HIGH (ref 12–46)
Lymphs Abs: 7.6 10*3/uL — ABNORMAL HIGH (ref 0.7–4.0)
MCH: 27.5 pg (ref 26.0–34.0)
MCHC: 33.1 g/dL (ref 30.0–36.0)
MCV: 83.3 fL (ref 78.0–100.0)
MONO ABS: 0.7 10*3/uL (ref 0.1–1.0)
MONOS PCT: 5 % (ref 3–12)
NEUTROS ABS: 5.3 10*3/uL (ref 1.7–7.7)
Neutrophils Relative %: 37 % — ABNORMAL LOW (ref 43–77)
PLATELETS: 250 10*3/uL (ref 150–400)
RBC: 4.61 MIL/uL (ref 3.87–5.11)
RDW: 14.2 % (ref 11.5–15.5)
Smear Review: ADEQUATE
WBC: 14.3 10*3/uL — AB (ref 4.0–10.5)

## 2013-12-18 LAB — COMPREHENSIVE METABOLIC PANEL
ALBUMIN: 3 g/dL — AB (ref 3.5–5.2)
ALK PHOS: 108 U/L (ref 39–117)
ALT: 17 U/L (ref 0–35)
ANION GAP: 10 (ref 5–15)
AST: 18 U/L (ref 0–37)
BILIRUBIN TOTAL: 0.2 mg/dL — AB (ref 0.3–1.2)
BUN: 12 mg/dL (ref 6–23)
CO2: 27 meq/L (ref 19–32)
CREATININE: 0.64 mg/dL (ref 0.50–1.10)
Calcium: 8.8 mg/dL (ref 8.4–10.5)
Chloride: 103 mEq/L (ref 96–112)
GFR calc Af Amer: >90 mL/min (ref >90)
GFR, EST NON AFRICAN AMERICAN: 86 mL/min — AB (ref >90)
GLUCOSE: 114 mg/dL — AB (ref 70–99)
POTASSIUM: 4.2 meq/L (ref 3.7–5.3)
Sodium: 140 mEq/L (ref 137–147)
Total Protein: 6.6 g/dL (ref 6.0–8.3)

## 2013-12-18 LAB — I-STAT TROPONIN, ED: TROPONIN I, POC: 0 ng/mL (ref 0.00–0.08)

## 2013-12-18 LAB — PROTIME-INR
INR: 1.2 (ref 0.00–1.49)
Prothrombin Time: 15.2 seconds (ref 11.6–15.2)

## 2013-12-18 LAB — D-DIMER, QUANTITATIVE: D-Dimer, Quant: 0.58 ug/mL-FEU — ABNORMAL HIGH (ref 0.00–0.48)

## 2013-12-18 MED ORDER — SODIUM CHLORIDE 0.9 % IV SOLN
INTRAVENOUS | Status: DC
  Administered 2013-12-18: 21:00:00 via INTRAVENOUS

## 2013-12-18 MED ORDER — LISINOPRIL-HYDROCHLOROTHIAZIDE 10-12.5 MG PO TABS: 1.0000 | ORAL_TABLET | Freq: Every morning | ORAL | Status: DC

## 2013-12-18 MED ORDER — SODIUM CHLORIDE 0.9 % IV BOLUS (SEPSIS)
500.0000 mL | Freq: Once | INTRAVENOUS | Status: AC
  Administered 2013-12-18: 500 mL via INTRAVENOUS

## 2013-12-18 MED ORDER — METOPROLOL SUCCINATE ER 50 MG PO TB24
50.0000 mg | ORAL_TABLET | Freq: Every day | ORAL | Status: DC
  Administered 2013-12-20: 50 mg via ORAL
  Filled 2013-12-18 (×3): qty 1

## 2013-12-18 MED ORDER — ADULT MULTIVITAMIN W/MINERALS CH
1.0000 | ORAL_TABLET | Freq: Every morning | ORAL | Status: DC
  Administered 2013-12-19 – 2013-12-22 (×4): 1 via ORAL
  Filled 2013-12-18 (×4): qty 1

## 2013-12-18 MED ORDER — RIVAROXABAN 20 MG PO TABS
20.0000 mg | ORAL_TABLET | Freq: Every day | ORAL | Status: DC
  Administered 2013-12-19: 20 mg via ORAL
  Filled 2013-12-18: qty 1

## 2013-12-18 MED ORDER — NAPROXEN SODIUM 275 MG PO TABS
220.0000 mg | ORAL_TABLET | Freq: Two times a day (BID) | ORAL | Status: DC
  Filled 2013-12-18: qty 1

## 2013-12-18 MED ORDER — PANTOPRAZOLE SODIUM 40 MG PO TBEC
80.0000 mg | DELAYED_RELEASE_TABLET | Freq: Every day | ORAL | Status: DC
  Administered 2013-12-19 – 2013-12-22 (×4): 80 mg via ORAL
  Filled 2013-12-18 (×4): qty 2

## 2013-12-18 NOTE — ED Notes (Signed)
Family at bedside. 

## 2013-12-18 NOTE — Discharge Instructions (Signed)
We have determined that your problem requires further evaluation in the emergency department.  We will take care of your transport there.  Once at the emergency department, you will be evaluated by a provider and they will order whatever treatment or tests they deem necessary.  We cannot guarantee that they will do any specific test or do any specific treatment.  ° °

## 2013-12-18 NOTE — ED Provider Notes (Signed)
CSN: [id number]     Arrival date & time 12/18/13  2120 History   First MD Initiated Contact with Patient 12/18/13 2135     Chief Complaint  Patient presents with  . Near Syncope     (Consider location/radiation/quality/duration/timing/severity/associated sxs/prior Treatment) HPI This is a 73 year old female with high blood pressure, ischemic heart disease, and history of cardiac arrhythmia as well as DVT on xarelto who presented today to the urgent care Center. She was in her usual state of health when she had an episode of near-syncope.  She She collapsed to the ground but did not lose consciousness. Her symptoms then resolved. She denies that she had any injuries with this. She states that she has some upper left-sided headache but that this is like her usual headaches. She denies chest pain, palpitations, nausea, vomiting, shortness of breath, bloody stool, dark or tarry stools, change in medication, or lateralized weakness.  She was intially evaluated at the urgent care center where initial labs and ekg obtained and sent to ed for further evaluation and admission  Past Medical History  Diagnosis Date  . Hypertension   . Hemorrhoid   . GERD (gastroesophageal reflux disease)   . Adenomatous polyps   . DVT (deep venous thrombosis) 2003    lower extremity DVT with questionable recurrence in 2005. Treated with 1.5 year course of coumadin.  [name] History of TIAs     2004 - no documentation available. 09/2010 -  was not on aspirin or statin therapy prior to event.  . Endometrial thickening on ultra sound 07/2009    path showing Fragments of benign endocervical and squamous mucosa. No dysplasia or malignancy // Followed by Dr. [name]  . Coronary artery disease, non-occlusive 11/2003    LHC (11/2003) - LAD diffuse 30% stenosis of mid-vessel. RCA with 30% stenosis of proximal vessel - by Dr. Dannielle Burn. // 2D Echo (04/2006) -  LV EF 65%.  Mild LVH. No  wall motion abnormalities.  Mild diastolic  dysfunction.   . Prediabetes DX: 09/2010    HgA1c 6.1  . Stroke    Past Surgical History  Procedure Laterality Date  . Tubal ligation    . Knee surgery  1996    Left knee arthroscopy.  . Colonoscopy w/ polypectomy  11/01/2007    8 mm rectal adenoma, hemorrhoids  . Rotator cuff repair      right.   Family History  Problem Relation Age of Onset  . Thyroid cancer Sister   . Liver cancer Sister   . Diabetes Mother   . Hyperlipidemia Mother   . Heart disease Mother   . Dementia Mother   . Angina Father   . Heart disease Brother     x 2 in 1s yo  . Alzheimer's disease Maternal Grandmother    History  Substance Use Topics  . Smoking status: Never Smoker   . Smokeless tobacco: Never Used  . Alcohol Use: No   OB History   Grav Para Term Preterm Abortions TAB SAB Ect Mult Living                 Review of Systems  All other systems reviewed and are negative.     Allergies  Review of patient's allergies indicates no known allergies.  Home Medications   Prior to Admission medications   Medication Sig Start Date End Date Taking? Authorizing Provider  HYDROcodone-acetaminophen (NORCO) 5-325 MG per tablet Take 1 tablet by mouth every 6 (six) hours as needed  for pain. 09/17/12   Orson Eva, MD  lisinopril-hydrochlorothiazide (PRINZIDE,ZESTORETIC) 10-12.5 MG per tablet Take 1 tablet by mouth every morning.     Historical Provider, MD  metoprolol succinate (TOPROL-XL) 50 MG 24 hr tablet Take 50 mg by mouth daily. Take with or immediately following a meal.    Historical Provider, MD  Multiple Vitamin (MULTIVITAMIN WITH MINERALS) TABS Take 1 tablet by mouth every morning.    Historical Provider, MD  naproxen sodium (ANAPROX) 220 MG tablet Take 220 mg by mouth daily as needed. For pain    Historical Provider, MD  omeprazole (PRILOSEC) 40 MG capsule Take 40 mg by mouth daily.    Historical Provider, MD  Promethazine-Phenylephrine (PROMETHAZINE VC PO) Take by mouth.    Historical  Provider, MD  Rivaroxaban (XARELTO) 15 MG TABS tablet Take 1 tablet (15 mg total) by mouth 2 (two) times daily with a meal. Take through October 07, 2012 09/17/12   Orson Eva, MD   BP 127/72  Pulse 61  Temp(Src) 98.3 F (36.8 C) (Oral)  Resp 18  Ht 5\' 3"  (1.6 m)  Wt 271 lb (122.925 kg)  BMI 48.02 kg/m2  SpO2 97% Physical Exam  Nursing note and vitals reviewed. Constitutional: She is oriented to person, place, and time. She appears well-developed and well-nourished.  HENT:  Head: Normocephalic and atraumatic.  Right Ear: External ear normal.  Left Ear: External ear normal.  Nose: Nose normal.  Mouth/Throat: Oropharynx is clear and moist.  Eyes: Conjunctivae and EOM are normal. Pupils are equal, round, and reactive to light.  Neck: Normal range of motion. Neck supple. No JVD present. No tracheal deviation present. No thyromegaly present.  Cardiovascular: Normal rate, regular rhythm, normal heart sounds and intact distal pulses.   Pulmonary/Chest: Effort normal and breath sounds normal. No respiratory distress. She has no wheezes.  Abdominal: Soft. Bowel sounds are normal. She exhibits no mass. There is no tenderness. There is no guarding.  Musculoskeletal: Normal range of motion.  Lymphadenopathy:    She has no cervical adenopathy.  Neurological: She is alert and oriented to person, place, and time. She has normal reflexes. No cranial nerve deficit or sensory deficit. Gait normal. GCS eye subscore is 4. GCS verbal subscore is 5. GCS motor subscore is 6.  Reflex Scores:      Bicep reflexes are 2+ on the right side and 2+ on the left side.      Patellar reflexes are 2+ on the right side and 2+ on the left side. Strength is 5/5 bilateral elbow flexor/extensors, wrist extension/flexion, intrinsic hand strength equal Bilateral hip flexion/extension 5/5, knee flexion/extension 5/5, ankle 5/5 flexion extension    Skin: Skin is warm and dry.  Psychiatric: She has a normal mood and affect. Her  behavior is normal. Judgment and thought content normal.    ED Course  Procedures (including critical care time) Labs Review Labs Reviewed  CBC WITH DIFFERENTIAL - Abnormal; Notable for the following:    WBC 14.3 (*)    All other components within normal limits  COMPREHENSIVE METABOLIC PANEL - Abnormal; Notable for the following:    Glucose, Bld 114 (*)    Albumin 3.0 (*)    Total Bilirubin 0.2 (*)    GFR calc non Af Amer 86 (*)    All other components within normal limits  D-DIMER, QUANTITATIVE - Abnormal; Notable for the following:    D-Dimer, Quant 0.58 (*)    All other components within normal limits  PROTIME-INR  Randolm Idol, ED    Imaging Review Dg Chest 2 View  12/18/2013   CLINICAL DATA:  Chest pain, cough.  Syncope.  EXAM: CHEST  2 VIEW  COMPARISON:  Chest radiograph performed 09/15/2012  FINDINGS: The lungs are well-aerated and clear. There is no evidence of focal opacification, pleural effusion or pneumothorax.  The heart is borderline enlarged. No acute osseous abnormalities are seen. The patient is status post right-sided rotator cuff repair.  IMPRESSION: Borderline cardiomegaly; no acute cardiopulmonary process seen.   Electronically Signed   By: Garald Balding M.D.   On: 12/18/2013 22:39     EKG Interpretation None      Date: 12/18/2013  Rate: 61  Rhythm: normal sinus rhythm  QRS Axis: left  Intervals: normal  ST/T Wave abnormalities: nonspecific ST changes  Conduction Disutrbances:first-degree A-V block   Narrative Interpretation:   Old EKG Reviewed: unchanged   MDM   Final diagnoses:  Near syncope   73 y.o. Female with near syncope with known cad.  She has a history of dvt and is on xarelto.  EKG is nonischemic. Patient had head ct due to some headache associated with event.  No evidence of sah on ct.  D- dimer is slightly elevated but given age likely within normal limits and patient is anticoagulated.  She will require further evaluation and  cardiology consult.    Discussed with Dr. Alcario Drought and he will see and admit.   Shaune Pollack, MD 12/18/13 [phone]

## 2013-12-18 NOTE — ED Notes (Signed)
Patient was talking, had a dizzy spell and fell forward onto couch.  No loc, no injury.  Patient has had uri/cough and is taking prometh vc syrup.  Family member reports blood pressure at home after incident 158/78 and reports cbg 102.

## 2013-12-18 NOTE — ED Notes (Signed)
Notified [facility] 

## 2013-12-18 NOTE — ED Notes (Signed)
EMS-pt was at home and had near syncope episode, did not LOC, landed on couch. Pt was sent over from [facility] for further eval due to hx. 22g(R)hand. Pt with no complaints at this time. States she "got tunnel vision and sat down." CBG 110

## 2013-12-18 NOTE — ED Notes (Signed)
EKG acquired at [facility]

## 2013-12-18 NOTE — ED Provider Notes (Signed)
Chief Complaint   Dizziness   History of Present Illness   Shirley [name] is a 73 year old female with a history of high blood pressure, ischemic heart disease, cardiac arrhythmia of uncertain type, history of DVT, and is on Xarelto who presents tonight with a brief episode of presyncope occurring around 4:45 PM this afternoon. The patient was at home standing up. She suddenly, without warning started falling to the right. She had no preceding symptoms of presyncope, dizziness, chest pain, tightness, pressure, shortness of breath, or palpitations. Her family members were around her were able to catch her before she fell and hit the floor. They lowered her to the ground. She did not lose consciousness and did not hit her head. There no other obvious injuries. She felt weak afterwards and laid on the couch for about 15 minutes, and was able to get up and go about her normal activities. She denies any symptoms afterwards of chest pain, tightness, pressure, palpitations, nausea, diaphoresis, or shortness of breath. She's had no nausea, vomiting, diarrhea, or blood in stool. No evidence of GI bleeding. She denies any back pain, prior history of syncope, new medications, or use of alcohol. She denies any neurological symptoms such as headache, blurry vision, diplopia, paresthesias, difficulty speaking, or localized muscle weakness.  Review of Systems   Other than noted above, the patient denies any of the following symptoms. Systemic:  No fever, chills, or fatigue. Pulmonary:  No cough, wheezing, shortness of breath. Cardiac:  No chest pain, tightness, pressure, palpitations, PND, orthopnea, or edema. Ext:  No leg pain or swelling. Neuro:  No weakness, paresthesias, or difficulty with speech or gait. No vertigo or difficulty with coordination or balance. No seizure activity, tongue biting, or incontinence.  Psych:  No anxiety or depression.  [place]   Past medical history, family history, social  history, meds, and allergies were reviewed.  She has no medication allergies. Current meds include lisinopril/hydrochlorothiazide, metoprolol, naproxen, omeprazole, and Xarelto. She has a history of hypertension, gastroesophageal reflux, DVT, TIAs, coronary artery disease, prediabetes, and stroke.  Physical Examination    Vital signs:  BP 180/75  Pulse 65  Temp(Src) 97.8 F (36.6 C) (Oral)  Resp 18  SpO2 97% Gen:  Alert, oriented, in no distress, skin warm and dry. Eye:  PERRL, lids and conjunctivas normal.  No stare or lid lag. ENT:  Mucous membranes moist, pharynx clear. Neck:  Supple, no adenopathy or tenderness.  No JVD.  Thyroid not enlarged. Lungs:  Clear to auscultation, no wheezes, rales or rhonchi.  No respiratory distress. Heart:  Regular rhythm, no extrasystoles.  No gallops, murmers, clicks or rubs. Abdomen:  Soft, nontender, no organomegaly or mass.  Bowel sounds normal.  No pulsatile abdominal mass or bruit. Ext:  No edema. Pulses full and equal. Neuro:  Neurological examination: The patient is alert and oriented x3. Speech is clear, fluent, and appropriate. Cranial nerves are intact. There is no pronator drift and finger to nose was normal. Muscle strength, sensation, and DTRs are normal. Babinskis are downgoing. Station and gait were normal. Romberg sign is negative, patient is able to perform tandem gait well. Skin:  Warm and dry.  No rash.   EKG Results:  Date: 12/18/2013  Rate: 61  Rhythm: normal sinus rhythm  QRS Axis: left: -43  Intervals: PR prolonged  ST/T Wave abnormalities: normal  Conduction Disutrbances:first-degree A-V block  and right bundle branch block  Narrative Interpretation: Sinus rhythm with first degree AV block, left axis deviation, incomplete  right bundle branch block, minimal voltage criteria for LVH.  There was no indication of QT prolongation, WPW, Brugada syndrome, ischemia, arrhythmia, or hypertrophic cardiomyopathy.   Old EKG Reviewed: none  available   Assessment   The encounter diagnosis was Pre-syncope.  Differential diagnosis includes acute coronary syndrome, complete heart block, other arrhythmia, pulmonary embolism, or GI bleed.  Plan     The patient was transferred to the ED via [facility] in stable condition.  Medical Decision Making:  73 year old female with history of CAD, cardiac arrhythmia, DVT, and on Xarelto had an episode of near syncope today at 4:45 p.m. Today while at home.  No preceding symptoms of chest pain, shortness of breath, palpitations, or nausea.  She did not lose consciousness.  Afterwards she felt weak for about 15 minutes, then came here.  Her exam here is normal.  Her EKG shows 1st degree A-V block, LAD, and incomplete RBBB.  I am concerned about heart block, arrhythmia, PE, or GI bleed and feel she needs further evaluation.      Harden Mo, MD 12/18/13 2016

## 2013-12-19 ENCOUNTER — Encounter (HOSPITAL_COMMUNITY): Payer: Self-pay | Admitting: General Practice

## 2013-12-19 DIAGNOSIS — R55 Syncope and collapse: Secondary | ICD-10-CM

## 2013-12-19 DIAGNOSIS — M7989 Other specified soft tissue disorders: Secondary | ICD-10-CM

## 2013-12-19 DIAGNOSIS — I517 Cardiomegaly: Secondary | ICD-10-CM | POA: Diagnosis not present

## 2013-12-19 DIAGNOSIS — I2510 Atherosclerotic heart disease of native coronary artery without angina pectoris: Secondary | ICD-10-CM

## 2013-12-19 DIAGNOSIS — I82409 Acute embolism and thrombosis of unspecified deep veins of unspecified lower extremity: Secondary | ICD-10-CM

## 2013-12-19 LAB — URINALYSIS, ROUTINE W REFLEX MICROSCOPIC
BILIRUBIN URINE: NEGATIVE
Glucose, UA: NEGATIVE mg/dL
Hgb urine dipstick: NEGATIVE
Ketones, ur: NEGATIVE mg/dL
NITRITE: POSITIVE — AB
PROTEIN: NEGATIVE mg/dL
SPECIFIC GRAVITY, URINE: 1.009 (ref 1.005–1.030)
Urobilinogen, UA: 0.2 mg/dL (ref 0.0–1.0)
pH: 7 (ref 5.0–8.0)

## 2013-12-19 LAB — BLOOD GAS, ARTERIAL
ACID-BASE DEFICIT: 0 mmol/L (ref 0.0–2.0)
Bicarbonate: 24 mEq/L (ref 20.0–24.0)
FIO2: 0.21 %
O2 SAT: 95.8 %
PATIENT TEMPERATURE: 98.6
PH ART: 7.413 (ref 7.350–7.450)
TCO2: 25.1 mmol/L (ref 0–100)
pCO2 arterial: 38.3 mmHg (ref 35.0–45.0)
pO2, Arterial: 80.1 mmHg (ref 80.0–100.0)

## 2013-12-19 LAB — URINE MICROSCOPIC-ADD ON

## 2013-12-19 LAB — TROPONIN I: Troponin I: <0.3 ng/mL (ref <0.30)

## 2013-12-19 LAB — GLUCOSE, CAPILLARY: GLUCOSE-CAPILLARY: 96 mg/dL (ref 70–99)

## 2013-12-19 MED ORDER — WARFARIN - PHARMACIST DOSING INPATIENT
Freq: Every day | Status: DC
  Administered 2013-12-20: 17:00:00

## 2013-12-19 MED ORDER — SODIUM CHLORIDE 0.9 % IJ SOLN
3.0000 mL | Freq: Two times a day (BID) | INTRAMUSCULAR | Status: DC
  Administered 2013-12-19 – 2013-12-22 (×3): 3 mL via INTRAVENOUS

## 2013-12-19 MED ORDER — NAPROXEN 250 MG PO TABS
220.0000 mg | ORAL_TABLET | Freq: Two times a day (BID) | ORAL | Status: DC
  Administered 2013-12-19: 250 mg via ORAL
  Filled 2013-12-19 (×3): qty 1

## 2013-12-19 MED ORDER — HEPARIN (PORCINE) IN NACL 100-0.45 UNIT/ML-% IJ SOLN
1150.0000 [IU]/h | INTRAMUSCULAR | Status: DC
  Administered 2013-12-20: 1250 [IU]/h via INTRAVENOUS
  Filled 2013-12-19 (×2): qty 250

## 2013-12-19 MED ORDER — DEXTROSE 5 % IV SOLN
1.0000 g | INTRAVENOUS | Status: DC
  Administered 2013-12-19 – 2013-12-21 (×3): 1 g via INTRAVENOUS
  Filled 2013-12-19 (×4): qty 10

## 2013-12-19 MED ORDER — HYDROCHLOROTHIAZIDE 12.5 MG PO CAPS
12.5000 mg | ORAL_CAPSULE | Freq: Every day | ORAL | Status: DC
  Administered 2013-12-19 – 2013-12-22 (×4): 12.5 mg via ORAL
  Filled 2013-12-19 (×4): qty 1

## 2013-12-19 MED ORDER — WARFARIN SODIUM 7.5 MG PO TABS
7.5000 mg | ORAL_TABLET | Freq: Once | ORAL | Status: AC
  Administered 2013-12-20: 7.5 mg via ORAL
  Filled 2013-12-19: qty 1

## 2013-12-19 MED ORDER — INFLUENZA VAC SPLIT QUAD 0.5 ML IM SUSY
0.5000 mL | PREFILLED_SYRINGE | INTRAMUSCULAR | Status: AC
  Administered 2013-12-20: 0.5 mL via INTRAMUSCULAR
  Filled 2013-12-19 (×2): qty 0.5

## 2013-12-19 MED ORDER — LISINOPRIL 10 MG PO TABS
10.0000 mg | ORAL_TABLET | Freq: Every day | ORAL | Status: DC
  Administered 2013-12-19 – 2013-12-22 (×4): 10 mg via ORAL
  Filled 2013-12-19 (×4): qty 1

## 2013-12-19 NOTE — Progress Notes (Signed)
ANTICOAGULATION CONSULT NOTE - Initial Consult  Pharmacy Consult for heparin>>warfarin  Indication: DVT  No Known Allergies  Patient Measurements: Height: 5' (152.4 cm) Weight: 273 lb 12.8 oz (124.195 kg) IBW/kg (Calculated) : 45.5 Heparin Dosing Weight: 78  Vital Signs: Temp: 98 F (36.7 C) (09/16 1410) Temp src: Oral (09/16 1410) BP: 138/48 mmHg (09/16 1410) Pulse Rate: 52 (09/16 1410)  Labs:  Recent Labs  12/18/13 2234 12/19/13 0134 12/19/13 0600 12/19/13 1210  HGB 12.7  --   --   --   HCT 38.4  --   --   --   PLT 250  --   --   --   LABPROT 15.2  --   --   --   INR 1.20  --   --   --   CREATININE 0.64  --   --   --   TROPONINI  --  <0.30 <0.30 <0.30    Estimated Creatinine Clearance: 76.1 ml/min (by C-G formula based on Cr of 0.64).   Medical History: Past Medical History  Diagnosis Date  . Hypertension   . Hemorrhoid   . GERD (gastroesophageal reflux disease)   . Adenomatous polyps   . DVT (deep venous thrombosis) 2003    lower extremity DVT with questionable recurrence in 2005. Treated with 1.5 year course of coumadin.  [name] History of TIAs     2004 - no documentation available. 09/2010 -  was not on aspirin or statin therapy prior to event.  . Endometrial thickening on ultra sound 07/2009    path showing Fragments of benign endocervical and squamous mucosa. No dysplasia or malignancy // Followed by Dr. [name]  . Coronary artery disease, non-occlusive 11/2003    LHC (11/2003) - LAD diffuse 30% stenosis of mid-vessel. RCA with 30% stenosis of proximal vessel - by Dr. Dannielle Burn. // 2D Echo (04/2006) -  LV EF 65%.  Mild LVH. No  wall motion abnormalities.  Mild diastolic dysfunction.   . Prediabetes DX: 09/2010    HgA1c 6.1  . Stroke   . Syncope 12/18/2013    Assessment: 73 year old female on chronic xarelto now with new DVT, PE being ruled out. Patient was given xarelto tonight, will plan to start IV heparin in am giving at least 12 hours for some of  the xarelto to clear. It is understood that patient is not fully benefiting from [place] but given that she just received a full dose and she is 73 will allow some time to transition. INR 1.2 on admit will go ahead and start warfarin tonight.  Goal of Therapy:  INR 2-3 Heparin level 0.3-0.7 units/ml Monitor platelets by anticoagulation protocol: Yes   Plan:  Start heparin at 1250 units/hr at 0600 Check baseline aptt/HL Warfarin 7.5mg  tonight Daily INR  Erin Hearing PharmD., BCPS Clinical Pharmacist Pager [phone] 12/19/2013 6:54 PM

## 2013-12-19 NOTE — H&P (Signed)
[facility] History and Physical  Cacie Gaskins BJY:[id number] DOB: [birth year] DOA: 12/18/2013  Referring physician: EDP PCP: Merrilee Seashore, MD   Chief Complaint: Near Syncope   HPI: Shirley [name] is a 73 y.o. female who presents to the ED after an episode of near syncope at home earlier today.  Collapsed to ground but no LOC.  No injury with this.  Had some upper left-sided headache but this is like her usual headaches.  Has h/o DVT x2 in the past, on chronic xarelto, legs are swollen but no more than baseline today.  Review of Systems: Systems reviewed.  As above, otherwise negative  Past Medical History  Diagnosis Date  . Hypertension   . Hemorrhoid   . GERD (gastroesophageal reflux disease)   . Adenomatous polyps   . DVT (deep venous thrombosis) 2003    lower extremity DVT with questionable recurrence in 2005. Treated with 1.5 year course of coumadin.  [name] History of TIAs     2004 - no documentation available. 09/2010 -  was not on aspirin or statin therapy prior to event.  . Endometrial thickening on ultra sound 07/2009    path showing Fragments of benign endocervical and squamous mucosa. No dysplasia or malignancy // Followed by Dr. [name]  . Coronary artery disease, non-occlusive 11/2003    LHC (11/2003) - LAD diffuse 30% stenosis of mid-vessel. RCA with 30% stenosis of proximal vessel - by Dr. Dannielle Burn. // 2D Echo (04/2006) -  LV EF 65%.  Mild LVH. No  wall motion abnormalities.  Mild diastolic dysfunction.   . Prediabetes DX: 09/2010    HgA1c 6.1  . Stroke    Past Surgical History  Procedure Laterality Date  . Tubal ligation    . Knee surgery  1996    Left knee arthroscopy.  . Colonoscopy w/ polypectomy  11/01/2007    8 mm rectal adenoma, hemorrhoids  . Rotator cuff repair      right.   Social History:  reports that she has never smoked. She has never used smokeless tobacco. She reports that she does not drink alcohol or use illicit drugs.  No Known  Allergies  Family History  Problem Relation Age of Onset  . Thyroid cancer Sister   . Liver cancer Sister   . Diabetes Mother   . Hyperlipidemia Mother   . Heart disease Mother   . Dementia Mother   . Angina Father   . Heart disease Brother     x 2 in 2s yo  . Alzheimer's disease Maternal Grandmother      Prior to Admission medications   Medication Sig Start Date End Date Taking? Authorizing Provider  lisinopril-hydrochlorothiazide (PRINZIDE,ZESTORETIC) 10-12.5 MG per tablet Take 1 tablet by mouth every morning.    Yes Historical Provider, MD  metoprolol succinate (TOPROL-XL) 50 MG 24 hr tablet Take 50 mg by mouth daily. Take with or immediately following a meal.   Yes Historical Provider, MD  Multiple Vitamin (MULTIVITAMIN WITH MINERALS) TABS Take 1 tablet by mouth every morning.   Yes Historical Provider, MD  naproxen sodium (ANAPROX) 220 MG tablet Take 220 mg by mouth 2 (two) times daily. For pain   Yes Historical Provider, MD  omeprazole (PRILOSEC) 40 MG capsule Take 40 mg by mouth daily.   Yes Historical Provider, MD  promethazine-phenylephrine (PROMETHAZINE-PHENYLEPHRINE) 6.25-5 MG/5ML SYRP Take 5 mLs by mouth every 6 (six) hours as needed for congestion.   Yes Historical Provider, MD  rivaroxaban (XARELTO) 20 MG TABS  tablet Take 20 mg by mouth daily with supper.   Yes Historical Provider, MD   Physical Exam: Filed Vitals:   12/18/13 2330  BP: 135/67  Pulse: 52  Temp:   Resp: 18    BP 135/67  Pulse 52  Temp(Src) 98.3 F (36.8 C) (Oral)  Resp 18  Ht 5\' 3"  (1.6 m)  Wt 122.925 kg (271 lb)  BMI 48.02 kg/m2  SpO2 98%  General Appearance:    Alert, oriented, no distress, appears stated age  Head:    Normocephalic, atraumatic  Eyes:    PERRL, EOMI, sclera non-icteric        Nose:   Nares without drainage or epistaxis. Mucosa, turbinates normal  Throat:   Moist mucous membranes. Oropharynx without erythema or exudate.  Neck:   Supple. No carotid bruits.  No  thyromegaly.  No lymphadenopathy.   Back:     No CVA tenderness, no spinal tenderness  Lungs:     Clear to auscultation bilaterally, without wheezes, rhonchi or rales  Chest wall:    No tenderness to palpitation  Heart:    Regular rate and rhythm without murmurs, gallops, rubs  Abdomen:     Soft, non-tender, nondistended, normal bowel sounds, no organomegaly  Genitalia:    deferred  Rectal:    deferred  Extremities:   No clubbing, cyanosis or edema.  Pulses:   2+ and symmetric all extremities  Skin:   Skin color, texture, turgor normal, no rashes or lesions  Lymph nodes:   Cervical, supraclavicular, and axillary nodes normal  Neurologic:   CNII-XII intact. Normal strength, sensation and reflexes      throughout    Labs on Admission:  Basic Metabolic Panel:  Recent Labs Lab 12/18/13 2234  NA 140  K 4.2  CL 103  CO2 27  GLUCOSE 114*  BUN 12  CREATININE 0.64  CALCIUM 8.8   Liver Function Tests:  Recent Labs Lab 12/18/13 2234  AST 18  ALT 17  ALKPHOS 108  BILITOT 0.2*  PROT 6.6  ALBUMIN 3.0*   No results found for this basename: LIPASE, AMYLASE,  in the last 168 hours No results found for this basename: AMMONIA,  in the last 168 hours CBC:  Recent Labs Lab 12/18/13 2234  WBC 14.3*  NEUTROABS 5.3  HGB 12.7  HCT 38.4  MCV 83.3  PLT 250   Cardiac Enzymes: No results found for this basename: CKTOTAL, CKMB, CKMBINDEX, TROPONINI,  in the last 168 hours  BNP (last 3 results) No results found for this basename: PROBNP,  in the last 8760 hours CBG: No results found for this basename: GLUCAP,  in the last 168 hours  Radiological Exams on Admission: Dg Chest 2 View  12/18/2013   CLINICAL DATA:  Chest pain, cough.  Syncope.  EXAM: CHEST  2 VIEW  COMPARISON:  Chest radiograph performed 09/15/2012  FINDINGS: The lungs are well-aerated and clear. There is no evidence of focal opacification, pleural effusion or pneumothorax.  The heart is borderline enlarged. No acute  osseous abnormalities are seen. The patient is status post right-sided rotator cuff repair.  IMPRESSION: Borderline cardiomegaly; no acute cardiopulmonary process seen.   Electronically Signed   By: Garald Balding M.D.   On: 12/18/2013 22:39   Ct Head Wo Contrast  12/19/2013   CLINICAL DATA:  Dizzy spell. Fell forward. No loss of consciousness. No injury. No loss of consciousness.  EXAM: CT HEAD WITHOUT CONTRAST  TECHNIQUE: Contiguous axial images were obtained from  the base of the skull through the vertex without intravenous contrast.  COMPARISON:  CT of the head 05/28/2013  FINDINGS: There is no intra or extra-axial fluid collection or mass lesion. The basilar cisterns and ventricles have a normal appearance. There is no CT evidence for acute infarction or hemorrhage. There is atherosclerotic calcification of the internal carotid arteries. Bone windows are otherwise unremarkable.  IMPRESSION: 1.  No evidence for acute intracranial abnormality. 2. No calvarial fracture.   Electronically Signed   By: Shon Hale M.D.   On: 12/19/2013 00:15    EKG: Independently reviewed.  Incomplete RBBB, unchanged from priors.  Assessment/Plan Principal Problem:   Near syncope Active Problems:   Hypertension   Coronary artery disease, non-occlusive   1. Near syncope - 1. Syncope pathway 2. 2d echo ordered 3. No neuro symptoms to indicate MRI, CT head was negative. 4. Tele monitor 2. HTN - continue home meds.    Code Status: Full  Family Communication: Family at bedside Disposition Plan: Admit to obs   Time spent: 50 min  GARDNER, JARED M. [facility] Pager [phone]  If 7AM-7PM, please contact the day team taking care of the patient [email or web address] Password [facility] 12/19/2013, 12:31 AM

## 2013-12-19 NOTE — Progress Notes (Signed)
Pt has positive d dimer given in report by ED RN. MD text paged and made aware. Will continue to monitor. Pt has no complaints at this time.

## 2013-12-19 NOTE — Progress Notes (Signed)
  Echocardiogram 2D Echocardiogram has been performed.  Donata Clay 12/19/2013, 10:47 AM

## 2013-12-19 NOTE — Progress Notes (Signed)
[facility] PROGRESS NOTE  Shirley [name] NGE:[id number] DOB: [birth year] DOA: 12/18/2013 PCP: Merrilee Seashore, MD  Assessment/Plan:  Near syncope  -leukocytosis, afebrile, will check UA -Workup with cardiac enzymes, Ct of head, CXR, glucose, WNL -2D echo 2008- EF 65 %, without  wall motion abnormalities, and with mild ventricular wall thickness -Repeat 2D echo pending -Continue Telemetry  History of DVT -Left extremity with increased swelling which she states is chronic. Denies any pain in extremities - INR normal -elevated D dimer, follow up bilateral venous US pending -continue Xarelto  Hypertension -continue home medication of lisinopril, hydrochlorothiazide, metoprolol  GERD -continue home regimen of pantoprazole  Obesity BMI 48   DVT Prophylaxis Xarelto Code Status: Full Family Communication: Daughter at bedside Disposition Plan: Inpatient, home when stable   Consultants:  None  Procedures:  2D echo  Antibiotics:  None  HPI  Shirley [name] is a 73 year old female with PMH of DVT, currently on Xarleto, high blood pressure, ischemic heart disease, cardiac arrhythmia of uncertain type who presented to ED after an episode of near syncope in which she was standing up and suddenly started falling, but was caught by family. She did not lose consciousness and did not hit her head, and felt mild dizziness afterwards but was able to get back to normal activities without any complaints. She denies any preceding symptoms of dizziness, chest pain, shortness of breath, or palpitations. She denies any back pain, prior history of syncope, new medications, alcohol or recreational drug use. She denies any other neurological deficits.   Subjective No current complains, denies any SOB, CP, or leg pain  Objective: Filed Vitals:   12/19/13 1057  BP: 141/60  Pulse: 52  Temp:   Resp: 18   No intake or output data in the 24 hours ending 12/19/13 1207 Filed Weights    12/18/13 2116 12/19/13 0100 12/19/13 0553  Weight: 122.925 kg (271 lb) 124.195 kg (273 lb 12.8 oz) 124.195 kg (273 lb 12.8 oz)    Exam:  Gen: Alert and oriented overweight female in NAD, laying comfortably in bed HEENT: Normocephalic, atraumatic.  Pupils symmertrical.  Moist mucosa.   Chest: clear to auscultate bilaterally, no ronchi or rales  Cardiac: Regular rate and rhythm, S1-S2, no rubs murmurs or gallops  Abdomen: soft, non tender, non distended, +bowel sounds. No guarding or rigidity  Extremities: Symmetrical in appearance without cyanosis.  Left leg with increased swelling.  Neurological: Alert awake oriented to time place and person.  Psychiatric: Appears normal.   Data Reviewed: Basic Metabolic Panel:  Recent Labs Lab 12/18/13 2234  NA 140  K 4.2  CL 103  CO2 27  GLUCOSE 114*  BUN 12  CREATININE 0.64  CALCIUM 8.8   Liver Function Tests:  Recent Labs Lab 12/18/13 2234  AST 18  ALT 17  ALKPHOS 108  BILITOT 0.2*  PROT 6.6  ALBUMIN 3.0*   No results found for this basename: LIPASE, AMYLASE,  in the last 168 hours No results found for this basename: AMMONIA,  in the last 168 hours CBC:  Recent Labs Lab 12/18/13 2234  WBC 14.3*  NEUTROABS 5.3  HGB 12.7  HCT 38.4  MCV 83.3  PLT 250   Cardiac Enzymes:  Recent Labs Lab 12/19/13 0134 12/19/13 0600  TROPONINI <0.30 <0.30   BNP (last 3 results) No results found for this basename: PROBNP,  in the last 8760 hours CBG:  Recent Labs Lab 12/19/13 0742  GLUCAP 96    No results found for  this or any previous visit (from the past 240 hour(s)).   Studies: Dg Chest 2 View  12/18/2013   CLINICAL DATA:  Chest pain, cough.  Syncope.  EXAM: CHEST  2 VIEW  COMPARISON:  Chest radiograph performed 09/15/2012  FINDINGS: The lungs are well-aerated and clear. There is no evidence of focal opacification, pleural effusion or pneumothorax.  The heart is borderline enlarged. No acute osseous abnormalities are  seen. The patient is status post right-sided rotator cuff repair.  IMPRESSION: Borderline cardiomegaly; no acute cardiopulmonary process seen.   Electronically Signed   By: Garald Balding M.D.   On: 12/18/2013 22:39   Ct Head Wo Contrast  12/19/2013   CLINICAL DATA:  Dizzy spell. Fell forward. No loss of consciousness. No injury. No loss of consciousness.  EXAM: CT HEAD WITHOUT CONTRAST  TECHNIQUE: Contiguous axial images were obtained from the base of the skull through the vertex without intravenous contrast.  COMPARISON:  CT of the head 05/28/2013  FINDINGS: There is no intra or extra-axial fluid collection or mass lesion. The basilar cisterns and ventricles have a normal appearance. There is no CT evidence for acute infarction or hemorrhage. There is atherosclerotic calcification of the internal carotid arteries. Bone windows are otherwise unremarkable.  IMPRESSION: 1.  No evidence for acute intracranial abnormality. 2. No calvarial fracture.   Electronically Signed   By: Shon Hale M.D.   On: 12/19/2013 00:15    Scheduled Meds: . lisinopril  10 mg Oral Daily   And  . hydrochlorothiazide  12.5 mg Oral Daily  . [START ON 12/20/2013] Influenza vac split quadrivalent PF  0.5 mL Intramuscular Tomorrow-1000  . metoprolol succinate  50 mg Oral Daily  . multivitamin with minerals  1 tablet Oral q morning - 10a  . naproxen  250 mg Oral BID  . pantoprazole  80 mg Oral Daily  . rivaroxaban  20 mg Oral Q supper  . sodium chloride  3 mL Intravenous Q12H   Continuous Infusions:   Principal Problem:   Near syncope Active Problems:   Hypertension   Coronary artery disease, non-occlusive    Time spent: [place], [facility]  [facility] Pager [phone]. If 7PM-7AM, please contact night-coverage at [email or web address], password [facility] 12/19/2013, 12:07 PM  LOS: 1 day

## 2013-12-19 NOTE — Progress Notes (Signed)
Utilization Review Completed.Normand Damron T9/16/2015  

## 2013-12-19 NOTE — Progress Notes (Addendum)
-   Patient describes near syncopal episode, with ongoing chest pain for a week. UnFortunately Doppler showed acute DVT while on Xarelto - Stop Xarelto start heparin and Coumadin per pharmacy. We'll consult hematology in the morning for followup as an outpatient.

## 2013-12-19 NOTE — Progress Notes (Addendum)
*  Preliminary Results* Bilateral lower extremity venous duplex completed. Bilateral lower extremities are positive for deep vein thrombosis involving bilateral posterior tibial and left peroneal veins. There is no evidence of Baker's cyst bilaterally.  12/19/2013  Maudry Mayhew, RVT, RDCS, RDMS

## 2013-12-20 ENCOUNTER — Encounter (HOSPITAL_COMMUNITY): Payer: Self-pay

## 2013-12-20 ENCOUNTER — Observation Stay (HOSPITAL_COMMUNITY): Payer: BC Managed Care – PPO

## 2013-12-20 DIAGNOSIS — R911 Solitary pulmonary nodule: Secondary | ICD-10-CM | POA: Diagnosis present

## 2013-12-20 DIAGNOSIS — Z833 Family history of diabetes mellitus: Secondary | ICD-10-CM | POA: Diagnosis not present

## 2013-12-20 DIAGNOSIS — I2510 Atherosclerotic heart disease of native coronary artery without angina pectoris: Secondary | ICD-10-CM | POA: Diagnosis present

## 2013-12-20 DIAGNOSIS — E785 Hyperlipidemia, unspecified: Secondary | ICD-10-CM | POA: Diagnosis present

## 2013-12-20 DIAGNOSIS — I824Z9 Acute embolism and thrombosis of unspecified deep veins of unspecified distal lower extremity: Secondary | ICD-10-CM | POA: Diagnosis present

## 2013-12-20 DIAGNOSIS — Z8673 Personal history of transient ischemic attack (TIA), and cerebral infarction without residual deficits: Secondary | ICD-10-CM | POA: Diagnosis not present

## 2013-12-20 DIAGNOSIS — Z800 Family history of malignant neoplasm of digestive organs: Secondary | ICD-10-CM | POA: Diagnosis not present

## 2013-12-20 DIAGNOSIS — R55 Syncope and collapse: Secondary | ICD-10-CM | POA: Diagnosis present

## 2013-12-20 DIAGNOSIS — K219 Gastro-esophageal reflux disease without esophagitis: Secondary | ICD-10-CM | POA: Diagnosis present

## 2013-12-20 DIAGNOSIS — I499 Cardiac arrhythmia, unspecified: Secondary | ICD-10-CM | POA: Diagnosis present

## 2013-12-20 DIAGNOSIS — N2889 Other specified disorders of kidney and ureter: Secondary | ICD-10-CM | POA: Diagnosis not present

## 2013-12-20 DIAGNOSIS — I10 Essential (primary) hypertension: Secondary | ICD-10-CM | POA: Diagnosis present

## 2013-12-20 DIAGNOSIS — Z23 Encounter for immunization: Secondary | ICD-10-CM | POA: Diagnosis not present

## 2013-12-20 DIAGNOSIS — E860 Dehydration: Secondary | ICD-10-CM | POA: Diagnosis present

## 2013-12-20 DIAGNOSIS — N390 Urinary tract infection, site not specified: Secondary | ICD-10-CM | POA: Diagnosis present

## 2013-12-20 DIAGNOSIS — Z7901 Long term (current) use of anticoagulants: Secondary | ICD-10-CM | POA: Diagnosis not present

## 2013-12-20 DIAGNOSIS — Z886 Allergy status to analgesic agent status: Secondary | ICD-10-CM | POA: Diagnosis not present

## 2013-12-20 DIAGNOSIS — Z86718 Personal history of other venous thrombosis and embolism: Secondary | ICD-10-CM | POA: Diagnosis not present

## 2013-12-20 DIAGNOSIS — Z8249 Family history of ischemic heart disease and other diseases of the circulatory system: Secondary | ICD-10-CM | POA: Diagnosis not present

## 2013-12-20 DIAGNOSIS — R7309 Other abnormal glucose: Secondary | ICD-10-CM | POA: Diagnosis present

## 2013-12-20 DIAGNOSIS — G4730 Sleep apnea, unspecified: Secondary | ICD-10-CM | POA: Diagnosis present

## 2013-12-20 DIAGNOSIS — Z6841 Body Mass Index (BMI) 40.0 and over, adult: Secondary | ICD-10-CM | POA: Diagnosis not present

## 2013-12-20 DIAGNOSIS — Z808 Family history of malignant neoplasm of other organs or systems: Secondary | ICD-10-CM | POA: Diagnosis not present

## 2013-12-20 LAB — CBC
HCT: 38.4 % (ref 36.0–46.0)
Hemoglobin: 12.6 g/dL (ref 12.0–15.0)
MCH: 27.9 pg (ref 26.0–34.0)
MCHC: 32.8 g/dL (ref 30.0–36.0)
MCV: 85.1 fL (ref 78.0–100.0)
PLATELETS: 229 10*3/uL (ref 150–400)
RBC: 4.51 MIL/uL (ref 3.87–5.11)
RDW: 14.4 % (ref 11.5–15.5)
WBC: 13.2 10*3/uL — AB (ref 4.0–10.5)

## 2013-12-20 LAB — PROTIME-INR
INR: 1.41 (ref 0.00–1.49)
PROTHROMBIN TIME: 17.3 s — AB (ref 11.6–15.2)

## 2013-12-20 LAB — HEMOGLOBIN A1C
Hgb A1c MFr Bld: 6.5 % — ABNORMAL HIGH (ref <5.7)
Mean Plasma Glucose: 140 mg/dL — ABNORMAL HIGH (ref <117)

## 2013-12-20 LAB — APTT
APTT: 104 s — AB (ref 24–37)
aPTT: 36 seconds (ref 24–37)
aPTT: 86 seconds — ABNORMAL HIGH (ref 24–37)

## 2013-12-20 LAB — PATHOLOGIST SMEAR REVIEW

## 2013-12-20 LAB — GLUCOSE, CAPILLARY: Glucose-Capillary: 109 mg/dL — ABNORMAL HIGH (ref 70–99)

## 2013-12-20 LAB — HEPARIN LEVEL (UNFRACTIONATED): HEPARIN UNFRACTIONATED: 1.74 [IU]/mL — AB (ref 0.30–0.70)

## 2013-12-20 MED ORDER — HYDROCODONE-ACETAMINOPHEN 5-325 MG PO TABS: 1.0000 | ORAL_TABLET | ORAL | Status: DC | PRN

## 2013-12-20 MED ORDER — WARFARIN VIDEO
Freq: Once | Status: AC
  Administered 2013-12-20: 17:00:00

## 2013-12-20 MED ORDER — COUMADIN BOOK
Freq: Once | Status: AC
  Administered 2013-12-20: 17:00:00
  Filled 2013-12-20: qty 1

## 2013-12-20 MED ORDER — TRAMADOL HCL 50 MG PO TABS
50.0000 mg | ORAL_TABLET | Freq: Four times a day (QID) | ORAL | Status: DC | PRN
  Administered 2013-12-20 – 2013-12-22 (×2): 50 mg via ORAL
  Filled 2013-12-20 (×2): qty 1

## 2013-12-20 MED ORDER — IOHEXOL 350 MG/ML SOLN
100.0000 mL | Freq: Once | INTRAVENOUS | Status: AC | PRN
  Administered 2013-12-20: 100 mL via INTRAVENOUS

## 2013-12-20 NOTE — Progress Notes (Signed)
ANTICOAGULATION CONSULT NOTE - Follow Up Consult  Pharmacy Consult for Heparin Indication: DVT  No Known Allergies  Patient Measurements: Height: 5' (152.4 cm) Weight: 273 lb 12.8 oz (124.195 kg) IBW/kg (Calculated) : 45.5 Heparin Dosing Weight: 78kg  Vital Signs: Temp: 98.7 F (37.1 C) (09/17 1141) Temp src: Oral (09/17 1141) BP: 148/56 mmHg (09/17 1141) Pulse Rate: 53 (09/17 1141)  Labs:  Recent Labs  12/18/13 2234 12/19/13 0134 12/19/13 0600 12/19/13 1210 12/20/13 0600  HGB 12.7  --   --   --  12.6  HCT 38.4  --   --   --  38.4  PLT 250  --   --   --  229  APTT  --   --   --   --  36  LABPROT 15.2  --   --   --  17.3*  INR 1.20  --   --   --  1.41  HEPARINUNFRC  --   --   --   --  1.74*  CREATININE 0.64  --   --   --   --   TROPONINI  --  <0.30 <0.30 <0.30  --     Estimated Creatinine Clearance: 76.1 ml/min (by C-G formula based on Cr of 0.64).   Medications:  Heparin @ 1250 units/hr  Assessment: 73yof on xarelto pta for hx DVT, admitted with new bilateral DVTs. She was started on IV heparin and coumadin (first dose to be given tonight). Last xarelto dose 9/16 @ 1700 and heparin started 9/17 @ 0618. We are using aPTTs to initially monitor heparin until all the xarelto clears since xarelto falsely elevates heparin levels. Initial aPTT is therapeutic. CBC is stable. No bleeding reported.  Baseline heparin level = 1.74, aPTT = 36, INR = 1.41  Goal of Therapy:  APTT 66-102s Heparin level 0.3-0.7 units/ml Monitor platelets by anticoagulation protocol: Yes   Plan:  1) Continue heparin at 1250 units/hr 2) Check 8 hour aPTT to confirm 3) Coumadin 7.5mg  x 1 as already ordered 4) INR in AM  Deboraha Sprang 12/20/2013,3:29 PM

## 2013-12-20 NOTE — Progress Notes (Signed)
-   Agree with plan as above. - Pt has failed Xarelto, develop DVT while on Xarelto. Consult Hematology for further ecomendations. - On rocephin awaiting UC. Probably contributing to syncope along with decrease intravascular volume. CT angio chest negative for PE.

## 2013-12-20 NOTE — Progress Notes (Signed)
[facility] PROGRESS NOTE  Shirley [name] UKG:[id number] DOB: Dec 04, 1940 DOA: 12/18/2013 PCP: Merrilee Seashore, MD  Assessment/Plan:  DVT -History of recurrent DVT and on chronic anticoagulation but failed Xarleto.  Presented symptomatic without any pain and with mild left extremity swelling.  -Doppler US- findings consistent with deep vein thrombosis involving the right posterial tibial vein, left posterial tibial vein, and left peroneal vein. -CTA without pulmonary embolism -Placed on IV heparin 12.5 ml/hr and Warfarin 7.5 mg/daily (dosing per pharmacy) - Xarelto discontinued -Consulted Hematology for further workup -Daily INR monitoring  Near syncope  -likely multifactorial with dehydration component as a result UTI -Workup with cardiac enzymes, Ct of head, CXR, glucose, WNL  -2D echo completed 12/19/13 without significant difference from echo completed in past- EF 60-65% with mild LV hypertrophy, and moderate diastolic dysfunction. Normal LV and RV size, normal systolic function.  2D echo 2008- EF 65 %, without wall motion abnormalities, and with mild ventricular wall thickness  -Continue Telemetry   Pulmonary nodule -5 mm nodule within the lingula. If the patient is at high risk  for bronchogenic carcinoma -Radiology recommends outpatient follow up with CT every 6 months  History of DVT   -Failed management with Xarelto and has new DVT -Placed on IV heparin and coumadin (dosing per pharmacy).   -discontinued Xarelto -Hematology consulted  Hyperglycemia -CBGs elevated - Will obtain Hgb A1C  Hypertension  -stable-continue home medication of lisinopril, hydrochlorothiazide, metoprolol   GERD  -continue home regimen of pantoprazole  Obesity  BMI 48    DVT Prophylaxis Heparin and Warfarin (dosing per pharmacy) Code Status: Full Family Communication: Daughter at bedside Disposition Plan: Inpatient. Home when  stable   Consultants:  Hematology  Procedures:  2D echocardiogram  Antibiotics:  IV ceftriaxone (day2)  HPI Shirley [name] is a 73 year old female with PMH of DVT, currently on Xarleto, high blood pressure, ischemic heart disease, cardiac arrhythmia of uncertain type who presented to ED after an episode of near syncope in which she was standing up and suddenly started falling, but was caught by family. She did not lose consciousness and did not hit her head, and felt mild dizziness afterwards but was able to get back to normal activities without any complaints. She denies any preceding symptoms of dizziness, chest pain, shortness of breath, or palpitations. She denies any back pain, prior history of syncope, new medications, alcohol or recreational drug use. She denies any other neurological deficits. Patient is admitted for further workup.  Subjective: No complains. Denies SOB, chest pain.  Objective: Filed Vitals:   12/20/13 1141  BP: 148/56  Pulse: 53  Temp: 98.7 F (37.1 C)  Resp: 17    Intake/Output Summary (Last 24 hours) at 12/20/13 1335 Last data filed at 12/20/13 0826  Gross per 24 hour  Intake    360 ml  Output      0 ml  Net    360 ml   Filed Weights   12/18/13 2116 12/19/13 0100 12/19/13 0553  Weight: 122.925 kg (271 lb) 124.195 kg (273 lb 12.8 oz) 124.195 kg (273 lb 12.8 oz)    Exam:  Gen: Alert and oriented overweight female in NAD, laying comfortably in bed  HEENT: Normocephalic, atraumatic. Pupils symmertrical. Moist mucosa.  Chest: clear to auscultate bilaterally, no ronchi or rales  Cardiac: Regular rate and rhythm, S1-S2, no rubs murmurs or gallops  Abdomen: soft, non tender, non distended, +bowel sounds. No guarding or rigidity  Extremities: Symmetrical in appearance without cyanosis. Left leg  with increased swelling.  Neurological: Alert awake oriented to time place and person.  Psychiatric: Appears normal. States anxious about new DVT  diagnosis.   Data Reviewed: Basic Metabolic Panel:  Recent Labs Lab 12/18/13 2234  NA 140  K 4.2  CL 103  CO2 27  GLUCOSE 114*  BUN 12  CREATININE 0.64  CALCIUM 8.8   Liver Function Tests:  Recent Labs Lab 12/18/13 2234  AST 18  ALT 17  ALKPHOS 108  BILITOT 0.2*  PROT 6.6  ALBUMIN 3.0*   No results found for this basename: LIPASE, AMYLASE,  in the last 168 hours No results found for this basename: AMMONIA,  in the last 168 hours CBC:  Recent Labs Lab 12/18/13 2234 12/20/13 0600  WBC 14.3* 13.2*  NEUTROABS 5.3  --   HGB 12.7 12.6  HCT 38.4 38.4  MCV 83.3 85.1  PLT 250 229   Cardiac Enzymes:  Recent Labs Lab 12/19/13 0134 12/19/13 0600 12/19/13 1210  TROPONINI <0.30 <0.30 <0.30   BNP (last 3 results) No results found for this basename: PROBNP,  in the last 8760 hours CBG:  Recent Labs Lab 12/19/13 0742 12/20/13 0716  GLUCAP 96 109*    No results found for this or any previous visit (from the past 240 hour(s)).   Studies: Dg Chest 2 View  12/18/2013   CLINICAL DATA:  Chest pain, cough.  Syncope.  EXAM: CHEST  2 VIEW  COMPARISON:  Chest radiograph performed 09/15/2012  FINDINGS: The lungs are well-aerated and clear. There is no evidence of focal opacification, pleural effusion or pneumothorax.  The heart is borderline enlarged. No acute osseous abnormalities are seen. The patient is status post right-sided rotator cuff repair.  IMPRESSION: Borderline cardiomegaly; no acute cardiopulmonary process seen.   Electronically Signed   By: Garald Balding M.D.   On: 12/18/2013 22:39   Ct Head Wo Contrast  12/19/2013   CLINICAL DATA:  Dizzy spell. Fell forward. No loss of consciousness. No injury. No loss of consciousness.  EXAM: CT HEAD WITHOUT CONTRAST  TECHNIQUE: Contiguous axial images were obtained from the base of the skull through the vertex without intravenous contrast.  COMPARISON:  CT of the head 05/28/2013  FINDINGS: There is no intra or  extra-axial fluid collection or mass lesion. The basilar cisterns and ventricles have a normal appearance. There is no CT evidence for acute infarction or hemorrhage. There is atherosclerotic calcification of the internal carotid arteries. Bone windows are otherwise unremarkable.  IMPRESSION: 1.  No evidence for acute intracranial abnormality. 2. No calvarial fracture.   Electronically Signed   By: Shon Hale M.D.   On: 12/19/2013 00:15   Ct Angio Chest Pe W/cm &/or Wo Cm  12/20/2013   CLINICAL DATA:  D-dimer 0.58. Near syncope. Dizziness. History of DVT. Possible PE.  EXAM: CT ANGIOGRAPHY CHEST WITH CONTRAST  TECHNIQUE: Multidetector CT imaging of the chest was performed using the standard protocol during bolus administration of intravenous contrast. Multiplanar CT image reconstructions and MIPs were obtained to evaluate the vascular anatomy.  CONTRAST:  178mL OMNIPAQUE IOHEXOL 350 MG/ML SOLN  COMPARISON:  None.  FINDINGS: Heart: Coronary calcifications are present. Heart is enlarged. No pericardial effusion  Vascular structures: Pulmonary arteries are well opacified. There is no evidence for acute pulmonary embolus. Bovine arch anatomy. Tortuous thoracic aorta.  Mediastinum/thyroid: No mediastinal, hilar, or axillary adenopathy. The visualized portion of the thyroid gland has a normal appearance.  Lungs/Airways: No focal consolidations or pleural effusions. There is  a 5 mm nodule seen at the periphery of the lingula. No other pulmonary nodules are identified.  Upper abdomen: Hiatal is present  Chest wall/osseous structures: Degenerative changes are seen in the thoracic spine. No suspicious abnormalities.  Review of the MIP images confirms the above findings.  IMPRESSION: 1. Technically adequate exam showing no pulmonary embolus. 2. Cardiomegaly.  Coronary artery calcifications. 3. Bovine arch anatomy incidentally noted. 4. Tortuous thoracic aorta. 5. 5 mm nodule within the lingula. If the patient is at high  risk for bronchogenic carcinoma, follow-up chest CT at 6-12 months is recommended. If the patient is at low risk for bronchogenic carcinoma, follow-up chest CT at 12 months is recommended. This recommendation follows the consensus statement: Guidelines for Management of Small Pulmonary Nodules Detected on CT Scans: A Statement from the [employer] as published in Radiology 2005;237:395-400. 6. Hiatal hernia.   Electronically Signed   By: Shon Hale M.D.   On: 12/20/2013 01:03    Scheduled Meds: . cefTRIAXone (ROCEPHIN)  IV  1 g Intravenous Q24H  . lisinopril  10 mg Oral Daily   And  . hydrochlorothiazide  12.5 mg Oral Daily  . Influenza vac split quadrivalent PF  0.5 mL Intramuscular Tomorrow-1000  . metoprolol succinate  50 mg Oral Daily  . multivitamin with minerals  1 tablet Oral q morning - 10a  . pantoprazole  80 mg Oral Daily  . sodium chloride  3 mL Intravenous Q12H  . warfarin  7.5 mg Oral ONCE-1800  . Warfarin - Pharmacist Dosing Inpatient   Does not apply q1800   Continuous Infusions: . heparin 1,250 Units/hr (12/20/13 0618)    Principal Problem:   Near syncope Active Problems:   Hypertension   Coronary artery disease, non-occlusive    Time spent: [place], [facility]  [facility] Pager [phone]. If 7PM-7AM, please contact night-coverage at [email or web address], password [facility] 12/20/2013, 1:35 PM  LOS: 2 days

## 2013-12-20 NOTE — Progress Notes (Signed)
Pt brought in her home CPAP machine but forgot the power cord. Pt's daughter will bring cord tomorrow. Pt is resting comfortably on room air. RN notified.

## 2013-12-21 ENCOUNTER — Inpatient Hospital Stay (HOSPITAL_COMMUNITY): Payer: BC Managed Care – PPO

## 2013-12-21 DIAGNOSIS — R7309 Other abnormal glucose: Secondary | ICD-10-CM

## 2013-12-21 DIAGNOSIS — D689 Coagulation defect, unspecified: Secondary | ICD-10-CM

## 2013-12-21 DIAGNOSIS — K219 Gastro-esophageal reflux disease without esophagitis: Secondary | ICD-10-CM

## 2013-12-21 DIAGNOSIS — I824Z9 Acute embolism and thrombosis of unspecified deep veins of unspecified distal lower extremity: Principal | ICD-10-CM

## 2013-12-21 LAB — CBC
HCT: 38.7 % (ref 36.0–46.0)
HEMOGLOBIN: 12.6 g/dL (ref 12.0–15.0)
MCH: 27.2 pg (ref 26.0–34.0)
MCHC: 32.6 g/dL (ref 30.0–36.0)
MCV: 83.4 fL (ref 78.0–100.0)
Platelets: 231 10*3/uL (ref 150–400)
RBC: 4.64 MIL/uL (ref 3.87–5.11)
RDW: 14.3 % (ref 11.5–15.5)
WBC: 13 10*3/uL — ABNORMAL HIGH (ref 4.0–10.5)

## 2013-12-21 LAB — GLUCOSE, CAPILLARY
GLUCOSE-CAPILLARY: 88 mg/dL (ref 70–99)
Glucose-Capillary: 93 mg/dL (ref 70–99)

## 2013-12-21 LAB — HEPARIN LEVEL (UNFRACTIONATED): Heparin Unfractionated: 0.55 IU/mL (ref 0.30–0.70)

## 2013-12-21 LAB — PROTIME-INR
INR: 1.16 (ref 0.00–1.49)
Prothrombin Time: 14.8 seconds (ref 11.6–15.2)

## 2013-12-21 LAB — APTT: aPTT: 90 seconds — ABNORMAL HIGH (ref 24–37)

## 2013-12-21 MED ORDER — WARFARIN SODIUM 7.5 MG PO TABS
7.5000 mg | ORAL_TABLET | Freq: Once | ORAL | Status: AC
  Administered 2013-12-21: 7.5 mg via ORAL
  Filled 2013-12-21: qty 1

## 2013-12-21 MED ORDER — ENOXAPARIN SODIUM 120 MG/0.8ML ~~LOC~~ SOLN
120.0000 mg | Freq: Two times a day (BID) | SUBCUTANEOUS | Status: DC
  Administered 2013-12-21 – 2013-12-22 (×3): 120 mg via SUBCUTANEOUS
  Filled 2013-12-21 (×4): qty 0.8

## 2013-12-21 MED ORDER — METOPROLOL SUCCINATE ER 25 MG PO TB24
25.0000 mg | ORAL_TABLET | Freq: Every day | ORAL | Status: DC
  Administered 2013-12-22: 25 mg via ORAL
  Filled 2013-12-21: qty 1

## 2013-12-21 NOTE — Progress Notes (Signed)
ANTICOAGULATION CONSULT NOTE - Follow Up Consult  Pharmacy Consult for Heparin  Indication: DVT  Allergies  Allergen Reactions  . Tylenol [Acetaminophen] Other (See Comments)    Liver damage, and upset stomach per patient    Patient Measurements: Height: 5' (152.4 cm) Weight: 273 lb 12.8 oz (124.195 kg) IBW/kg (Calculated) : 45.5  Vital Signs: Temp: 98.4 F (36.9 C) (09/17 2022) Temp src: Oral (09/17 2022) BP: 158/62 mmHg (09/17 2231) Pulse Rate: 56 (09/17 2224)  Labs:  Recent Labs  12/18/13 2234 12/19/13 0134 12/19/13 0600 12/19/13 1210 12/20/13 0600 12/20/13 1500 12/20/13 2306  HGB 12.7  --   --   --  12.6  --   --   HCT 38.4  --   --   --  38.4  --   --   PLT 250  --   --   --  229  --   --   APTT  --   --   --   --  36 86* 104*  LABPROT 15.2  --   --   --  17.3*  --   --   INR 1.20  --   --   --  1.41  --   --   HEPARINUNFRC  --   --   --   --  1.74*  --   --   CREATININE 0.64  --   --   --   --   --   --   TROPONINI  --  <0.30 <0.30 <0.30  --   --   --     Estimated Creatinine Clearance: 76.1 ml/min (by C-G formula based on Cr of 0.64).   Assessment: Elevated aPTT on heparin/warfarin bridge, was on xarelto PTA so using aPTT to guide dosing for now. Other labs as above. No issues per RN.   Goal of Therapy:  Heparin level 0.3-0.7 units/ml aPTT 66-102 seconds Monitor platelets by anticoagulation protocol: Yes   Plan:  -Reduce heparin drip to 1150 units/hr -1000 HL/aPTT -Daily aPTT/HL/CBC -Monitor for bleeding  Narda Bonds 12/21/2013,1:52 AM

## 2013-12-21 NOTE — Progress Notes (Signed)
Pt stated she was not ready at this time to go on CPAP but would place herself on when she was. RT instructed pt to call if she had any problems.

## 2013-12-21 NOTE — Progress Notes (Signed)
Pts heart rate is in the 40's. She is asymptomatic but I held her am dose of metoprolol. Dr. Tana Coast made aware and agreed to hold. Will closely monitor and pt and daughter at bedside made aware. Will closely monitor

## 2013-12-21 NOTE — Progress Notes (Signed)
ANTICOAGULATION CONSULT NOTE - Follow Up Consult  Pharmacy Consult for Lovenox and Coumadin Indication: DVT  Allergies  Allergen Reactions  . Tylenol [Acetaminophen] Other (See Comments)    Liver damage, and upset stomach per patient    Patient Measurements: Height: 5' (152.4 cm) Weight: 271 lb 12.8 oz (123.288 kg) IBW/kg (Calculated) : 45.5 Heparin Dosing Weight: 78kg  Vital Signs: Temp: 98 F (36.7 C) (09/18 0532) Temp src: Oral (09/18 0532) BP: 141/44 mmHg (09/18 0532) Pulse Rate: 49 (09/18 0532)  Labs:  Recent Labs  12/18/13 2234 12/19/13 0134 12/19/13 0600 12/19/13 1210 12/20/13 0600 12/20/13 1500 12/20/13 2306 12/21/13 0458  HGB 12.7  --   --   --  12.6  --   --  12.6  HCT 38.4  --   --   --  38.4  --   --  38.7  PLT 250  --   --   --  229  --   --  231  APTT  --   --   --   --  36 86* 104*  --   LABPROT 15.2  --   --   --  17.3*  --   --  14.8  INR 1.20  --   --   --  1.41  --   --  1.16  HEPARINUNFRC  --   --   --   --  1.74*  --   --   --   CREATININE 0.64  --   --   --   --   --   --   --   TROPONINI  --  <0.30 <0.30 <0.30  --   --   --   --     Estimated Creatinine Clearance: 75.7 ml/min (by C-G formula based on Cr of 0.64).  Assessment: 73yof on xarelto pta for hx DVT, admitted with new bilateral DVTs. She was started on IV heparin and coumadin. Today she is being transitioned to lovenox with plans to go home tomorrow. INR is subtherapeutic as expected after first dose of coumadin (BL INR of 1.41 likely influenced by xarelto).  Today is day #2 overlap therapy. She will need at least 5 days of overlap.   Goal of Therapy:  INR 2-3 Heparin level 0.3-0.7 units/ml Monitor platelets by anticoagulation protocol: Yes   Plan:  1) Stop heparin 2) Start lovenox 120mg  (1mg /kg) sq bid, ~1 hour after heparin stopped 3) Repeat coumadin 7.5mg  x 1 4) INR in AM  Deboraha Sprang 12/21/2013,10:19 AM

## 2013-12-21 NOTE — Care Management Note (Addendum)
    Page 1 of 1   12/21/2013     12:37:02 PM CARE MANAGEMENT NOTE 12/21/2013  Patient:  Shirley [name], Shirley [name]   Account Number:  [id number]  Date Initiated:  12/21/2013  Documentation initiated by:  GRAVES-BIGELOW,Jean Alejos  Subjective/Objective Assessment:   Pt admitted for near syncope and recurrent DVT's.     Action/Plan:   Pt to be d/c home with lovenox injections. Per daughter she will be able to assist with injections.   Anticipated DC Date:  12/22/2013   Anticipated DC Plan:  [place]  CM consult  Medication Assistance      Choice offered to / List presented to:             Status of service:  Completed, signed off Medicare Important Message given?  NO (If response is "NO", the following Medicare IM given date fields will be blank) Date Medicare IM given:   Medicare IM given by:   Date Additional Medicare IM given:   Additional Medicare IM given by:    Discharge Disposition:  HOME/SELF CARE  Per UR Regulation:  Reviewed for med. necessity/level of care/duration of stay  If discussed at [facility] of Stay Meetings, dates discussed:    Comments:  [facility]:  lovenox: $125.00 will have to go through specialty pharmacy  enoxaparin: $59.64 will have to go through specialty pharmacy  [facility]   [facility] [address] and 10 syringes are availalbe of enoxaparin. CM did make pt and family aware.

## 2013-12-21 NOTE — Progress Notes (Signed)
[facility] HEMATOLOGY CONSULT NOTE  Patient Care Team: Merrilee Seashore, MD as PCP - General (Internal Medicine)  CHIEF COMPLAINTS/PURPOSE OF CONSULTATION:   Recurrent DVT on Xarelto.  ASSESSMENT/PLAN:   64. 73 years old Hispanic female admitted for near-syncope episode on 12/18/2013. She has prior history of DVT and TIA. CT Brain was negative, EKG normal. Doppler showed an acute DVT. She is now on Lovenox and Warfarin. Question was about the anticoagulation strategy moving forward and etiology of the clot.  2. Samreet has a long history of obesity and sedentary lifestyle. She has had significant knee problems which have kept her from exercising and of course got worse since the weight problem which further worsens the knee problems. In August of 2005 she had right knee surgery and shortly thereafter was diagnosed with a left lower extremity DVT. She was treated with Coumadin for 30 months.   3. In June of 2014 she complained of pain in her right leg. There had been no specific provoking event leading to this symptom. Evaluation showed a DVT again in the left leg. She was started on rivaroxaban (Xarelto). She was tolerating the anticoagulation well. She was referred to Dr Jana Hakim and seen on 03/21/2013 to discuss anticoagulation and he recommended weight loss and to consider bariatric surgery and to continue the life long anticoagulation. The hypercoagulable panel testing done on 02/22/2013 was negative for any known thrombophilia specifically it was negative for FVL mutation, prothrombin gene mutation, LAC, antiphospholipid syndrome, protein C, S and AT-III deficiency.   4. Patient is up to date with her screenings like mammogram, colonoscopy and pap smears. There is no family history of cancers or blood clots.   5. She needs to continue the Coumadin life-long now and the INR goal should be kept between 2.5-3.0 range. She does not appear to have a hypercoagulable state or any  underlying malignancy and Compliance to the medication does not appear to be an issue but at least with Coumadin she will have ongoing monitoring of her INR and we can better follow and establish that her anticoagulation goal is being met.   HISTORY OF PRESENTING ILLNESS:   Mikisha Roseland is a 73 y.o. female who presents to the ED after an episode of near syncope at home. Collapsed to ground but no LOC. No injury with this. Had some upper left-sided headache but this is like her usual headaches. Has h/o DVT x2 in the past, on chronic xarelto, legs were swollen. Bilateral lower extremity venous duplex completed. Bilateral lower extremities are positive for deep vein thrombosis involving bilateral posterior tibial and left peroneal veins. There is no evidence of Baker's cyst bilaterally. CTA without pulmonary embolism. Placed on IV heparin 12.5 ml/hr and Warfarin 7.5 mg/daily (dosing per pharmacy), Xarelto discontinued. Now patient on Lovenox and coumadin and with plan to discharge tomorrow.    MEDICAL HISTORY:  Past Medical History  Diagnosis Date  . Hypertension   . Hemorrhoid   . GERD (gastroesophageal reflux disease)   . Adenomatous polyps   . DVT (deep venous thrombosis) 2003    lower extremity DVT with questionable recurrence in 2005. Treated with 1.5 year course of coumadin.  [name] History of TIAs     2004 - no documentation available. 09/2010 -  was not on aspirin or statin therapy prior to event.  . Endometrial thickening on ultra sound 07/2009    path showing Fragments of benign endocervical and squamous mucosa. No dysplasia or malignancy // Followed by  Dr. [name]  . Coronary artery disease, non-occlusive 11/2003    LHC (11/2003) - LAD diffuse 30% stenosis of mid-vessel. RCA with 30% stenosis of proximal vessel - by Dr. Dannielle Burn. // 2D Echo (04/2006) -  LV EF 65%.  Mild LVH. No  wall motion abnormalities.  Mild diastolic dysfunction.   . Prediabetes DX: 09/2010    HgA1c 6.1  . Stroke    . Syncope 12/18/2013  Sleep apnea SURGICAL HISTORY: Past Surgical History  Procedure Laterality Date  . Tubal ligation    . Knee surgery  1996    Left knee arthroscopy.  . Colonoscopy w/ polypectomy  11/01/2007    8 mm rectal adenoma, hemorrhoids  . Rotator cuff repair      right.    SOCIAL HISTORY: History   Social History  . Marital Status: Married    Spouse Name: N/A    Number of Children: 6  . Years of Education: College   Occupational History  . Control and instrumentation engineer   . [employer]   Social History Main Topics  . Smoking status: Never Smoker   . Smokeless tobacco: Never Used  . Alcohol Use: No  . Drug Use: No  . Sexual Activity: Not on file   Other Topics Concern  . Not on file   Social History Narrative   Full Code status.   Insurance: BCBS    FAMILY HISTORY: Family History  Problem Relation Age of Onset  . Thyroid cancer Sister   . Liver cancer Sister   . Diabetes Mother   . Hyperlipidemia Mother   . Heart disease Mother   . Dementia Mother   . Angina Father   . Heart disease Brother     x 2 in 58s yo  . Alzheimer's disease Maternal Grandmother     ALLERGIES:  is allergic to tylenol.  MEDICATIONS:  Current Facility-Administered Medications  Medication Dose Route Frequency Provider Last Rate Last Dose  . cefTRIAXone (ROCEPHIN) 1 g in dextrose 5 % 50 mL IVPB  1 g Intravenous Q24H Jeryl Columbia, NP   1 g at 12/20/13 2209  . enoxaparin (LOVENOX) injection 120 mg  120 mg Subcutaneous BID Benjamine Sprague Ravanna, RPH   120 mg at 12/21/13 1232  . lisinopril (PRINIVIL,ZESTRIL) tablet 10 mg  10 mg Oral Daily Etta Quill, DO   10 mg at 12/21/13 4665   And  . hydrochlorothiazide (MICROZIDE) capsule 12.5 mg  12.5 mg Oral Daily Etta Quill, DO   12.5 mg at 12/21/13 9935  . [START ON 12/22/2013] metoprolol succinate (TOPROL-XL) 24 hr tablet 25 mg  25 mg Oral Daily Ripudeep K Rai, MD      . multivitamin with  minerals tablet 1 tablet  1 tablet Oral q morning - 10a Etta Quill, DO   1 tablet at 12/21/13 [phone]  . pantoprazole (PROTONIX) EC tablet 80 mg  80 mg Oral Daily Etta Quill, DO   80 mg at 12/21/13 0959  . sodium chloride 0.9 % injection 3 mL  3 mL Intravenous Q12H Etta Quill, DO   3 mL at 12/19/13 1053  . traMADol (ULTRAM) tablet 50 mg  50 mg Oral Q6H PRN Charlynne Cousins, MD   50 mg at 12/20/13 1041  . Warfarin - Pharmacist Dosing Inpatient   Does not apply [place], [facility]        REVIEW OF SYSTEMS:   Constitutional: Denies fevers, chills or  abnormal night sweats Eyes: Denies blurriness of vision, double vision or watery eyes Ears, nose, mouth, throat, and face: Denies mucositis or sore throat Respiratory: Denies cough, dyspnea or wheezes Cardiovascular: Denies palpitation, chest discomfort or lower extremity swelling Gastrointestinal:  Denies nausea, heartburn or change in bowel habits Skin: Denies abnormal skin rashes Lymphatics: Denies new lymphadenopathy or easy bruising Neurological:Denies numbness, tingling or new weaknesses Behavioral/Psych: Mood is stable, no new changes  All other systems were reviewed with the patient and are negative.  PHYSICAL EXAMINATION: ECOG PERFORMANCE STATUS: 0  Filed Vitals:   12/21/13 1433  BP: 152/49  Pulse: 67  Temp: 98.2 F (36.8 C)  Resp: 18   Filed Weights   12/19/13 0100 12/19/13 0553 12/21/13 0532  Weight: 273 lb 12.8 oz (124.195 kg) 273 lb 12.8 oz (124.195 kg) 271 lb 12.8 oz (123.288 kg)    GENERAL:alert, no distress and comfortable, obese+ SKIN: skin color, texture, turgor are normal, no rashes or significant lesions EYES: normal, conjunctiva are pink and non-injected, sclera clear OROPHARYNX:no exudate, no erythema and lips, buccal mucosa, and tongue normal  NECK: supple, thyroid normal size, non-tender, without nodularity LYMPH:  no palpable lymphadenopathy in the cervical, axillary or  inguinal LUNGS: clear to auscultation and percussion with normal breathing effort HEART: regular rate & rhythm and no murmurs and no lower extremity edema ABDOMEN:abdomen soft, non-tender and normal bowel sounds Musculoskeletal:no cyanosis of digits and no clubbing  PSYCH: alert & oriented x 3 with fluent speech NEURO: no focal motor/sensory deficits  LABORATORY DATA:  I have reviewed the data as listed Lab Results  Component Value Date   WBC 13.0* 12/21/2013   HGB 12.6 12/21/2013   HCT 38.7 12/21/2013   MCV 83.4 12/21/2013   PLT 231 12/21/2013    Recent Labs  05/28/13 1925 12/18/13 2234  NA 140 140  K 4.1 4.2  CL 101 103  CO2 26 27  GLUCOSE 113* 114*  BUN 17 12  CREATININE 0.64 0.64  CALCIUM 9.4 8.8  GFRNONAA 87* 86*  GFRAA >90 >90  PROT 7.2 6.6  ALBUMIN 3.4* 3.0*  AST 18 18  ALT 23 17  ALKPHOS 114 108  BILITOT 0.2* 0.2*        RADIOGRAPHIC STUDIES: I have personally reviewed the radiological images as listed and agreed with the findings in the report. Dg Chest 2 View  12/18/2013   CLINICAL DATA:  Chest pain, cough.  Syncope.  EXAM: CHEST  2 VIEW  COMPARISON:  Chest radiograph performed 09/15/2012  FINDINGS: The lungs are well-aerated and clear. There is no evidence of focal opacification, pleural effusion or pneumothorax.  The heart is borderline enlarged. No acute osseous abnormalities are seen. The patient is status post right-sided rotator cuff repair.  IMPRESSION: Borderline cardiomegaly; no acute cardiopulmonary process seen.   Electronically Signed   By: Garald Balding M.D.   On: 12/18/2013 22:39   Ct Head Wo Contrast  12/19/2013   CLINICAL DATA:  Dizzy spell. Fell forward. No loss of consciousness. No injury. No loss of consciousness.  EXAM: CT HEAD WITHOUT CONTRAST  TECHNIQUE: Contiguous axial images were obtained from the base of the skull through the vertex without intravenous contrast.  COMPARISON:  CT of the head 05/28/2013  FINDINGS: There is no intra or  extra-axial fluid collection or mass lesion. The basilar cisterns and ventricles have a normal appearance. There is no CT evidence for acute infarction or hemorrhage. There is atherosclerotic calcification of the internal carotid arteries. Bone  windows are otherwise unremarkable.  IMPRESSION: 1.  No evidence for acute intracranial abnormality. 2. No calvarial fracture.   Electronically Signed   By: Shon Hale M.D.   On: 12/19/2013 00:15   Ct Angio Chest Pe W/cm &/or Wo Cm  12/20/2013   CLINICAL DATA:  D-dimer 0.58. Near syncope. Dizziness. History of DVT. Possible PE.  EXAM: CT ANGIOGRAPHY CHEST WITH CONTRAST  TECHNIQUE: Multidetector CT imaging of the chest was performed using the standard protocol during bolus administration of intravenous contrast. Multiplanar CT image reconstructions and MIPs were obtained to evaluate the vascular anatomy.  CONTRAST:  121m OMNIPAQUE IOHEXOL 350 MG/ML SOLN  COMPARISON:  None.  FINDINGS: Heart: Coronary calcifications are present. Heart is enlarged. No pericardial effusion  Vascular structures: Pulmonary arteries are well opacified. There is no evidence for acute pulmonary embolus. Bovine arch anatomy. Tortuous thoracic aorta.  Mediastinum/thyroid: No mediastinal, hilar, or axillary adenopathy. The visualized portion of the thyroid gland has a normal appearance.  Lungs/Airways: No focal consolidations or pleural effusions. There is a 5 mm nodule seen at the periphery of the lingula. No other pulmonary nodules are identified.  Upper abdomen: Hiatal is present  Chest wall/osseous structures: Degenerative changes are seen in the thoracic spine. No suspicious abnormalities.  Review of the MIP images confirms the above findings.  IMPRESSION: 1. Technically adequate exam showing no pulmonary embolus. 2. Cardiomegaly.  Coronary artery calcifications. 3. Bovine arch anatomy incidentally noted. 4. Tortuous thoracic aorta. 5. 5 mm nodule within the lingula. If the patient is at high  risk for bronchogenic carcinoma, follow-up chest CT at 6-12 months is recommended. If the patient is at low risk for bronchogenic carcinoma, follow-up chest CT at 12 months is recommended. This recommendation follows the consensus statement: Guidelines for Management of Small Pulmonary Nodules Detected on CT Scans: A Statement from the [employer] published in Radiology 2005;237:395-400. 6. Hiatal hernia.   Electronically Signed   By: BShon HaleM.D.   On: 12/20/2013 01:03   [place]  12/21/2013   CLINICAL DATA:  The provided history states the patient has a history of renal lesion. There is no prior imaging at this institution indicating further details regarding a possible renal abnormality.  EXAM: RENAL/URINARY TRACT ULTRASOUND COMPLETE  COMPARISON:  None.  FINDINGS: Right Kidney:  Length: 11.0 cm. Hypoechoic oval circumscribed right mid renal cortical lesion weight is identified measuring 9 x 7 x 6 mm. Detail obscured by patient body habitus. No hydronephrosis.  Left Kidney:  Length: 10.2 cm. Echogenicity within normal limits. No mass or hydronephrosis visualized.  Bladder:  Decompressed.  Patient voided prior to the exam.  IMPRESSION: Probable sub cm cyst right mid kidney, suboptimally evaluated at ultrasound due to patient body habitus and lesion small size. Comparison to presumed outside prior exam would be helpful to determine if any further followup is needed. Consider follow-up ultrasound in 1 year for surveillance if outside studies cannot be obtained.   Electronically Signed   By: GConchita ParisM.D.   On: 12/21/2013 11:02      All questions were answered. The patient knows to call the clinic with any problems, questions or concerns. I spent 30 minutes counseling the patient face to face. The total time spent in the appointment was 45 minutes.    ABernadene Bell MD Medical Hematologist/Oncologist CBradyPager: [phone] No: [phone]

## 2013-12-21 NOTE — Discharge Instructions (Signed)
Information on my medicine - Coumadin   (Warfarin)  This medication education was reviewed with me or my healthcare representative as part of my discharge preparation.  The pharmacist that spoke with me during my hospital stay was:  Deboraha Sprang, [facility]  Why was Coumadin prescribed for you? Coumadin was prescribed for you because you have a blood clot or a medical condition that can cause an increased risk of forming blood clots. Blood clots can cause serious health problems by blocking the flow of blood to the heart, lung, or brain. Coumadin can prevent harmful blood clots from forming. As a reminder your indication for Coumadin is:   Deep Vein Thrombosis Treatment  What test will check on my response to Coumadin? While on Coumadin (warfarin) you will need to have an INR test regularly to ensure that your dose is keeping you in the desired range. The INR (international normalized ratio) number is calculated from the result of the laboratory test called prothrombin time (PT).  If an INR APPOINTMENT HAS NOT ALREADY BEEN MADE FOR YOU please schedule an appointment to have this lab work done by your health care provider within 7 days. Your INR goal is usually a number between:  2 to 3 or your provider may give you a more narrow range like 2-2.5.  Ask your health care provider during an office visit what your goal INR is.  What  do you need to  know  About  COUMADIN? Take Coumadin (warfarin) exactly as prescribed by your healthcare provider about the same time each day.  DO NOT stop taking without talking to the doctor who prescribed the medication.  Stopping without other blood clot prevention medication to take the place of Coumadin may increase your risk of developing a new clot or stroke.  Get refills before you run out.  What do you do if you miss a dose? If you miss a dose, take it as soon as you remember on the same day then continue your regularly scheduled regimen the next day.  Do not  take two doses of Coumadin at the same time.  Important Safety Information A possible side effect of Coumadin (Warfarin) is an increased risk of bleeding. You should call your healthcare provider right away if you experience any of the following:   Bleeding from an injury or your nose that does not stop.   Unusual colored urine (red or dark brown) or unusual colored stools (red or black).   Unusual bruising for unknown reasons.   A serious fall or if you hit your head (even if there is no bleeding).  Some foods or medicines interact with Coumadin (warfarin) and might alter your response to warfarin. To help avoid this:   Eat a balanced diet, maintaining a consistent amount of Vitamin K.   Notify your provider about major diet changes you plan to make.   Avoid alcohol or limit your intake to 1 drink for women and 2 drinks for men per day. (1 drink is 5 oz. wine, 12 oz. beer, or 1.5 oz. liquor.)  Make sure that ANY health care provider who prescribes medication for you knows that you are taking Coumadin (warfarin).  Also make sure the healthcare provider who is monitoring your Coumadin knows when you have started a new medication including herbals and non-prescription products.  Coumadin (Warfarin)  Major Drug Interactions  Increased Warfarin Effect Decreased Warfarin Effect  Alcohol (large quantities) Antibiotics (esp. Septra/Bactrim, Flagyl, Cipro) Amiodarone (Cordarone) Aspirin (ASA) Cimetidine (  Tagamet) Megestrol (Megace) NSAIDs (ibuprofen, naproxen, etc.) Piroxicam (Feldene) Propafenone (Rythmol SR) Propranolol (Inderal) Isoniazid (INH) Posaconazole (Noxafil) Barbiturates (Phenobarbital) Carbamazepine (Tegretol) Chlordiazepoxide (Librium) Cholestyramine (Questran) Griseofulvin Oral Contraceptives Rifampin Sucralfate (Carafate) Vitamin K   Coumadin (Warfarin) Major Herbal Interactions  Increased Warfarin Effect Decreased Warfarin Effect  Garlic Ginseng Ginkgo biloba  Coenzyme Q10 Green tea St. Johns wort    Coumadin (Warfarin) FOOD Interactions  Eat a consistent number of servings per week of foods HIGH in Vitamin K (1 serving =  cup)  Collards (cooked, or boiled & drained) Kale (cooked, or boiled & drained) Mustard greens (cooked, or boiled & drained) Parsley *serving size only =  cup Spinach (cooked, or boiled & drained) Swiss chard (cooked, or boiled & drained) Turnip greens (cooked, or boiled & drained)  Eat a consistent number of servings per week of foods MEDIUM-HIGH in Vitamin K (1 serving = 1 cup)  Asparagus (cooked, or boiled & drained) Broccoli (cooked, boiled & drained, or raw & chopped) Brussel sprouts (cooked, or boiled & drained) *serving size only =  cup Lettuce, raw (green leaf, endive, romaine) Spinach, raw Turnip greens, raw & chopped   These websites have more information on Coumadin (warfarin):  FailFactory.se; VeganReport.com.au;

## 2013-12-21 NOTE — Progress Notes (Signed)
Nutrition Brief Note  Patient requested to see RD for snacks between meals because she gets hungry at bedtime. Patient reports that she has been trying to lose weight; down over 30 pounds since April. Will provide snacks TID between meals per patient request. No further nutrition needs at this time.  Molli Barrows, RD, LDN, [place] Pager [phone] After Hours Pager [phone]

## 2013-12-21 NOTE — Progress Notes (Signed)
Patient ID: Shirley [name]  female  OQH:[id number]    DOB: 1940-11-29    DOA: 12/18/2013  PCP: Merrilee Seashore, MD  Assessment/Plan: Principal Problem: Recurrent DVT : History of recurrent DVT and on chronic anticoagulation but failed Xarleto. Presented symptomatic without any pain and with mild left extremity swelling.  -Doppler US- findings consistent with deep vein thrombosis involving the right posterial tibial vein, left posterial tibial vein, and left peroneal vein.  -CTA without pulmonary embolism, xarelto was discontinued -Patient was initially placed on IV heparin and Coumadin per pharmacy. Patient will likely need to be on Coumadin lifelong, explained to the patient's daughter regarding Lovenox injections, she is comfortable with Lovenox subcutaneous injections. - Will transitioned to Lovenox and Coumadin, hematology consult pending, discussed with Dr Lona Kettle, will see pt today.   Near syncope  -likely multifactorial with dehydration component as a result UTI, Workup with cardiac enzymes, Ct of head, CXR, glucose, WNL  -2D echo completed 12/19/13 without significant difference from echo completed in past- EF 60-65% with mild LV hypertrophy, and moderate diastolic dysfunction. Normal LV and RV size, normal systolic function. 2D echo 2008- EF 65 %, without wall motion abnormalities, and with mild ventricular wall thickness   Pulmonary nodule  -5 mm nodule within the lingula, repeat CT outpatient in 6-12 months  UTI:  - Unfortunately urine culture was not sent, patient is on IV Rocephin, day #3, will not need any outpatient antibiotics  Hyperglycemia  - Hgb A1C 6.5, patient counseled on prediabetes, need to change lifestyle, diet and weight  Hypertension  -stable-continue home medication of lisinopril, hydrochlorothiazide, metoprolol   GERD  -continue home regimen of pantoprazole   Obesity  BMI 48   DVT Prophylaxis: on lovenox and coumadin  Code Status:  Family  Communication: d/w daughter at bed side  Disposition: likely DC in AM  Consultants:  Hematology  Procedures:  None  Antibiotics:  IV Rocephin    Subjective: Patient seen and examined, denies any specific complaints, daughter at bed side  Objective: Weight change:  No intake or output data in the 24 hours ending 12/21/13 1158 Blood pressure 141/44, pulse 49, temperature 98 F (36.7 C), temperature source Oral, resp. rate 16, height 5' (1.524 m), weight 123.288 kg (271 lb 12.8 oz), SpO2 99.00%.  Physical Exam: General: Alert and awake, oriented x3, not in any acute distress. CVS: S1-S2 clear, no murmur rubs or gallops Chest: clear to auscultation bilaterally, no wheezing, rales or rhonchi Abdomen: soft nontender, nondistended, normal bowel sounds  Extremities: no cyanosis, clubbing or edema noted bilaterally Neuro: Cranial nerves II-XII intact, no focal neurological deficits  Lab Results: Basic Metabolic Panel:  Recent Labs Lab 12/18/13 2234  NA 140  K 4.2  CL 103  CO2 27  GLUCOSE 114*  BUN 12  CREATININE 0.64  CALCIUM 8.8   Liver Function Tests:  Recent Labs Lab 12/18/13 2234  AST 18  ALT 17  ALKPHOS 108  BILITOT 0.2*  PROT 6.6  ALBUMIN 3.0*   No results found for this basename: LIPASE, AMYLASE,  in the last 168 hours No results found for this basename: AMMONIA,  in the last 168 hours CBC:  Recent Labs Lab 12/18/13 2234 12/20/13 0600 12/21/13 0458  WBC 14.3* 13.2* 13.0*  NEUTROABS 5.3  --   --   HGB 12.7 12.6 12.6  HCT 38.4 38.4 38.7  MCV 83.3 85.1 83.4  PLT 250 229 231   Cardiac Enzymes:  Recent Labs Lab 12/19/13 0134 12/19/13 0600  12/19/13 1210  TROPONINI <0.30 <0.30 <0.30   BNP: No components found with this basename: POCBNP,  CBG:  Recent Labs Lab 12/19/13 0742 12/20/13 0716 12/21/13 0740  GLUCAP 96 109* 88     Micro Results: No results found for this or any previous visit (from the past 240  hour(s)).  Studies/Results: Dg Chest 2 View  12/18/2013   CLINICAL DATA:  Chest pain, cough.  Syncope.  EXAM: CHEST  2 VIEW  COMPARISON:  Chest radiograph performed 09/15/2012  FINDINGS: The lungs are well-aerated and clear. There is no evidence of focal opacification, pleural effusion or pneumothorax.  The heart is borderline enlarged. No acute osseous abnormalities are seen. The patient is status post right-sided rotator cuff repair.  IMPRESSION: Borderline cardiomegaly; no acute cardiopulmonary process seen.   Electronically Signed   By: Garald Balding M.D.   On: 12/18/2013 22:39   Ct Head Wo Contrast  12/19/2013   CLINICAL DATA:  Dizzy spell. Fell forward. No loss of consciousness. No injury. No loss of consciousness.  EXAM: CT HEAD WITHOUT CONTRAST  TECHNIQUE: Contiguous axial images were obtained from the base of the skull through the vertex without intravenous contrast.  COMPARISON:  CT of the head 05/28/2013  FINDINGS: There is no intra or extra-axial fluid collection or mass lesion. The basilar cisterns and ventricles have a normal appearance. There is no CT evidence for acute infarction or hemorrhage. There is atherosclerotic calcification of the internal carotid arteries. Bone windows are otherwise unremarkable.  IMPRESSION: 1.  No evidence for acute intracranial abnormality. 2. No calvarial fracture.   Electronically Signed   By: Shon Hale M.D.   On: 12/19/2013 00:15   Ct Angio Chest Pe W/cm &/or Wo Cm  12/20/2013   CLINICAL DATA:  D-dimer 0.58. Near syncope. Dizziness. History of DVT. Possible PE.  EXAM: CT ANGIOGRAPHY CHEST WITH CONTRAST  TECHNIQUE: Multidetector CT imaging of the chest was performed using the standard protocol during bolus administration of intravenous contrast. Multiplanar CT image reconstructions and MIPs were obtained to evaluate the vascular anatomy.  CONTRAST:  152mL OMNIPAQUE IOHEXOL 350 MG/ML SOLN  COMPARISON:  None.  FINDINGS: Heart: Coronary calcifications are  present. Heart is enlarged. No pericardial effusion  Vascular structures: Pulmonary arteries are well opacified. There is no evidence for acute pulmonary embolus. Bovine arch anatomy. Tortuous thoracic aorta.  Mediastinum/thyroid: No mediastinal, hilar, or axillary adenopathy. The visualized portion of the thyroid gland has a normal appearance.  Lungs/Airways: No focal consolidations or pleural effusions. There is a 5 mm nodule seen at the periphery of the lingula. No other pulmonary nodules are identified.  Upper abdomen: Hiatal is present  Chest wall/osseous structures: Degenerative changes are seen in the thoracic spine. No suspicious abnormalities.  Review of the MIP images confirms the above findings.  IMPRESSION: 1. Technically adequate exam showing no pulmonary embolus. 2. Cardiomegaly.  Coronary artery calcifications. 3. Bovine arch anatomy incidentally noted. 4. Tortuous thoracic aorta. 5. 5 mm nodule within the lingula. If the patient is at high risk for bronchogenic carcinoma, follow-up chest CT at 6-12 months is recommended. If the patient is at low risk for bronchogenic carcinoma, follow-up chest CT at 12 months is recommended. This recommendation follows the consensus statement: Guidelines for Management of Small Pulmonary Nodules Detected on CT Scans: A Statement from the [place] as published in Radiology 2005;237:395-400. 6. Hiatal hernia.   Electronically Signed   By: Shon Hale M.D.   On: 12/20/2013 01:03   US Renal  12/21/2013   CLINICAL DATA:  The provided history states the patient has a history of renal lesion. There is no prior imaging at this institution indicating further details regarding a possible renal abnormality.  EXAM: RENAL/URINARY TRACT ULTRASOUND COMPLETE  COMPARISON:  None.  FINDINGS: Right Kidney:  Length: 11.0 cm. Hypoechoic oval circumscribed right mid renal cortical lesion weight is identified measuring 9 x 7 x 6 mm. Detail obscured by patient body habitus. No  hydronephrosis.  Left Kidney:  Length: 10.2 cm. Echogenicity within normal limits. No mass or hydronephrosis visualized.  Bladder:  Decompressed.  Patient voided prior to the exam.  IMPRESSION: Probable sub cm cyst right mid kidney, suboptimally evaluated at ultrasound due to patient body habitus and lesion small size. Comparison to presumed outside prior exam would be helpful to determine if any further followup is needed. Consider follow-up ultrasound in 1 year for surveillance if outside studies cannot be obtained.   Electronically Signed   By: Conchita Paris M.D.   On: 12/21/2013 11:02    Medications: Scheduled Meds: . cefTRIAXone (ROCEPHIN)  IV  1 g Intravenous Q24H  . enoxaparin (LOVENOX) injection  120 mg Subcutaneous BID  . lisinopril  10 mg Oral Daily   And  . hydrochlorothiazide  12.5 mg Oral Daily  . [START ON 12/22/2013] metoprolol succinate  25 mg Oral Daily  . multivitamin with minerals  1 tablet Oral q morning - 10a  . pantoprazole  80 mg Oral Daily  . sodium chloride  3 mL Intravenous Q12H  . warfarin  7.5 mg Oral ONCE-1800  . Warfarin - Pharmacist Dosing Inpatient   Does not apply q1800      LOS: 3 days   Jordi Kamm M.D. [facility] 12/21/2013, 11:58 AM Pager: [phone]  If 7PM-7AM, please contact night-coverage [email or web address] Password [email or web address]  **Disclaimer: This note was dictated with voice recognition software. Similar sounding words can inadvertently be transcribed and this note may contain transcription errors which may not have been corrected upon publication of note.**

## 2013-12-22 DIAGNOSIS — E785 Hyperlipidemia, unspecified: Secondary | ICD-10-CM

## 2013-12-22 DIAGNOSIS — I10 Essential (primary) hypertension: Secondary | ICD-10-CM

## 2013-12-22 LAB — GLUCOSE, CAPILLARY
GLUCOSE-CAPILLARY: 103 mg/dL — AB (ref 70–99)
Glucose-Capillary: 125 mg/dL — ABNORMAL HIGH (ref 70–99)

## 2013-12-22 LAB — PROTIME-INR
INR: 1.18 (ref 0.00–1.49)
PROTHROMBIN TIME: 15 s (ref 11.6–15.2)

## 2013-12-22 MED ORDER — METOPROLOL SUCCINATE ER 25 MG PO TB24
25.0000 mg | ORAL_TABLET | Freq: Every day | ORAL | Status: DC
Start: 2013-12-22 — End: 2014-05-01

## 2013-12-22 MED ORDER — WARFARIN SODIUM 5 MG PO TABS: 7.5000 mg | ORAL_TABLET | Freq: Every day | ORAL | Status: DC

## 2013-12-22 MED ORDER — TRAMADOL HCL 50 MG PO TABS: 50.0000 mg | ORAL_TABLET | Freq: Four times a day (QID) | ORAL | Status: DC | PRN

## 2013-12-22 MED ORDER — ENOXAPARIN SODIUM 120 MG/0.8ML ~~LOC~~ SOLN: 120.0000 mg | Freq: Two times a day (BID) | SUBCUTANEOUS | Status: DC

## 2013-12-22 NOTE — Discharge Summary (Signed)
Physician Discharge Summary  Patient ID: Shirley [name] MRN: [id number] DOB/AGE: 73-Oct-1942 73 y.o.  Admit date: 12/18/2013 Discharge date: 12/22/2013  Primary Care Physician:  Merrilee Seashore, MD  Discharge Diagnoses:   . Recurrent DVT (deep venous thrombosis), failure on xarelto  . Coronary artery disease, non-occlusive . Near syncope . Hypertension . UTI  . hyperglycemia  . Hyperlipidemia Pulmonary nodule  Consults:  Hematology, Dr Lona Kettle    Recommendations for Outpatient Follow-up:  Per hematology recommendations: She needs to continue the Coumadin life-long now and the INR goal should be kept between 2.5-3.0 range. She does not appear to have a hypercoagulable state or any underlying malignancy and Compliance to the medication does not appear to be an issue but at least with Coumadin she will have ongoing monitoring of her INR and we can better follow and establish that her anticoagulation goal is being met.    Allergies:   Allergies  Allergen Reactions  . Tylenol [Acetaminophen] Other (See Comments)    Liver damage, and upset stomach per patient     Discharge Medications:   Medication List    STOP taking these medications       rivaroxaban 20 MG Tabs tablet  Commonly known as:  XARELTO      TAKE these medications       enoxaparin 120 MG/0.8ML injection  Commonly known as:  LOVENOX  Inject 0.8 mLs (120 mg total) into the skin 2 (two) times daily.     lisinopril-hydrochlorothiazide 10-12.5 MG per tablet  Commonly known as:  PRINZIDE,ZESTORETIC  Take 1 tablet by mouth every morning.     metoprolol succinate 25 MG 24 hr tablet  Commonly known as:  TOPROL-XL  Take 1 tablet (25 mg total) by mouth daily. Take with or immediately following a meal.     multivitamin with minerals Tabs tablet  Take 1 tablet by mouth every morning.     naproxen sodium 220 MG tablet  Commonly known as:  ANAPROX  Take 220 mg by mouth 2 (two) times daily. For pain      omeprazole 40 MG capsule  Commonly known as:  PRILOSEC  Take 40 mg by mouth daily.     promethazine-phenylephrine 6.25-5 MG/5ML Syrp  Generic drug:  promethazine-phenylephrine  Take 5 mLs by mouth every 6 (six) hours as needed for congestion.     traMADol 50 MG tablet  Commonly known as:  ULTRAM  Take 1 tablet (50 mg total) by mouth every 6 (six) hours as needed for moderate pain.     warfarin 5 MG tablet  Commonly known as:  COUMADIN  Take 1.5 tablets (7.5 mg total) by mouth daily. Dose to be adjusted according to PT/INR         Brief H and P: For complete details please refer to admission H and P, but in brief Shirley [name] is a 73 y.o. female who presents to the ED after an episode of near syncope at home earlier today. Collapsed to ground but no LOC. No injury with this. Had some upper left-sided headache but this is like her usual headaches. Has h/o DVT x2 in the past, on chronic xarelto, legs are swollen but no more than baseline    Hospital Course:  Recurrent DVT : History of recurrent DVT and on chronic anticoagulation but failed Xarleto. Presented symptomatic without any pain and with mild left extremity swelling.  -Doppler US- findings consistent with deep vein thrombosis involving the right posterial tibial vein, left posterial tibial  vein, and left peroneal vein.  -CTA without pulmonary embolism, xarelto was discontinued  -Patient was initially placed on IV heparin and Coumadin per pharmacy which was transitioned to Lovenox and Coumadin. Patient and her daughter is comfortable with using the Lovenox injections. She was given prescription for 14 Lovenox injections, Coumadin 7.5 mg daily, deduced will be adjusted by her PCP. Per hematology recommendations as above, the goal INR is 2.5-3.   Near syncope  -likely multifactorial with dehydration component as a result UTI, Workup with cardiac enzymes, Ct of head, CXR, glucose, WNL  -2D echo completed 12/19/13 without  significant difference from echo completed in past- EF 60-65% with mild LV hypertrophy, and moderate diastolic dysfunction. Normal LV and RV size, normal systolic function. 2D echo 2008- EF 65 %, without wall motion abnormalities, and with mild ventricular wall thickness   Pulmonary nodule  -5 mm nodule within the lingula, repeat CT outpatient in 6-12 months   UTI:  - Unfortunately urine culture was not sent, patient has completed 3 days of IV Rocephin, will not need any outpatient antibiotics   Hyperglycemia  - Hgb A1C 6.5, patient counseled on prediabetes, need to change lifestyle, diet and weight  Hypertension  -stable-continue home medication of lisinopril, hydrochlorothiazide, metoprolol  GERD  -continue home regimen of pantoprazole  Morbid Obesity : BMI 48, patient was strongly counseled on diet and weight control    Day of Discharge BP 123/38  Pulse 57  Temp(Src) 98.5 F (36.9 C) (Oral)  Resp 18  Ht 5' (1.524 m)  Wt 121.791 kg (268 lb 8 oz)  BMI 52.44 kg/m2  SpO2 100%  Physical Exam: General: Alert and awake oriented x3 not in any acute distress. CVS: S1-S2 clear no murmur rubs or gallops Chest: clear to auscultation bilaterally, no wheezing rales or rhonchi Abdomen: Morbidly obese soft nontender, nondistended, normal bowel sounds Extremities: no cyanosis, clubbing or edema noted bilaterally Neuro: Cranial nerves II-XII intact, no focal neurological deficits   The results of significant diagnostics from this hospitalization (including imaging, microbiology, ancillary and laboratory) are listed below for reference.    LAB RESULTS: Basic Metabolic Panel:  Recent Labs Lab 12/18/13 2234  NA 140  K 4.2  CL 103  CO2 27  GLUCOSE 114*  BUN 12  CREATININE 0.64  CALCIUM 8.8   Liver Function Tests:  Recent Labs Lab 12/18/13 2234  AST 18  ALT 17  ALKPHOS 108  BILITOT 0.2*  PROT 6.6  ALBUMIN 3.0*   No results found for this basename: LIPASE, AMYLASE,  in  the last 168 hours No results found for this basename: AMMONIA,  in the last 168 hours CBC:  Recent Labs Lab 12/18/13 2234 12/20/13 0600 12/21/13 0458  WBC 14.3* 13.2* 13.0*  NEUTROABS 5.3  --   --   HGB 12.7 12.6 12.6  HCT 38.4 38.4 38.7  MCV 83.3 85.1 83.4  PLT 250 229 231   Cardiac Enzymes:  Recent Labs Lab 12/19/13 0600 12/19/13 1210  TROPONINI <0.30 <0.30   BNP: No components found with this basename: POCBNP,  CBG:  Recent Labs Lab 12/22/13 0721 12/22/13 0758  GLUCAP 103* 125*    Significant Diagnostic Studies:  Dg Chest 2 View  12/18/2013   CLINICAL DATA:  Chest pain, cough.  Syncope.  EXAM: CHEST  2 VIEW  COMPARISON:  Chest radiograph performed 09/15/2012  FINDINGS: The lungs are well-aerated and clear. There is no evidence of focal opacification, pleural effusion or pneumothorax.  The heart is borderline  enlarged. No acute osseous abnormalities are seen. The patient is status post right-sided rotator cuff repair.  IMPRESSION: Borderline cardiomegaly; no acute cardiopulmonary process seen.   Electronically Signed   By: Garald Balding M.D.   On: 12/18/2013 22:39   Ct Head Wo Contrast  12/19/2013   CLINICAL DATA:  Dizzy spell. Fell forward. No loss of consciousness. No injury. No loss of consciousness.  EXAM: CT HEAD WITHOUT CONTRAST  TECHNIQUE: Contiguous axial images were obtained from the base of the skull through the vertex without intravenous contrast.  COMPARISON:  CT of the head 05/28/2013  FINDINGS: There is no intra or extra-axial fluid collection or mass lesion. The basilar cisterns and ventricles have a normal appearance. There is no CT evidence for acute infarction or hemorrhage. There is atherosclerotic calcification of the internal carotid arteries. Bone windows are otherwise unremarkable.  IMPRESSION: 1.  No evidence for acute intracranial abnormality. 2. No calvarial fracture.   Electronically Signed   By: Shon Hale M.D.   On: 12/19/2013 00:15      Disposition and Follow-up:    DISPOSITION: Home  DIET: Carb modified diet   TESTS THAT NEED FOLLOW-UP PT/INR on Monday  DISCHARGE FOLLOW-UP     Follow-up Information   Follow up with [facility], MD On 12/31/2013. (for hospital follow-up, obtain labs, PT/ INR on Monday )    Specialty:  Internal Medicine   Contact information:   [address] [place] [address] [phone]       Time spent on Discharge: 40 minutes  Signed:   Finlee Concepcion M.D. [facility] 12/22/2013, 10:35 AM Pager: [phone]   **Disclaimer: This note was dictated with voice recognition software. Similar sounding words can inadvertently be transcribed and this note may contain transcription errors which may not have been corrected upon publication of note.**

## 2013-12-24 ENCOUNTER — Other Ambulatory Visit: Payer: Self-pay | Admitting: Hematology

## 2013-12-24 ENCOUNTER — Telehealth: Payer: Self-pay | Admitting: Hematology

## 2013-12-24 ENCOUNTER — Ambulatory Visit (HOSPITAL_BASED_OUTPATIENT_CLINIC_OR_DEPARTMENT_OTHER): Payer: BC Managed Care – PPO

## 2013-12-24 DIAGNOSIS — I82409 Acute embolism and thrombosis of unspecified deep veins of unspecified lower extremity: Secondary | ICD-10-CM | POA: Insufficient documentation

## 2013-12-24 LAB — PROTIME-INR
INR: 1.6 — ABNORMAL LOW (ref 2.00–3.50)
PROTIME: 19.2 s — AB (ref 10.6–13.4)

## 2013-12-24 NOTE — Telephone Encounter (Signed)
Pt confirmed labs/cou/ov per MD, cancelled w/Dr. Jana Hakim per pt req...Marland KitchenMarland KitchenMarland Kitchen

## 2013-12-25 ENCOUNTER — Encounter: Payer: Self-pay | Admitting: Pharmacist

## 2013-12-25 ENCOUNTER — Telehealth: Payer: Self-pay | Admitting: *Deleted

## 2013-12-25 ENCOUNTER — Ambulatory Visit: Payer: BC Managed Care – PPO

## 2013-12-25 NOTE — Telephone Encounter (Signed)
Received call from patient's daughter, Caren Griffins, stating,"mom is hot and nauseated. Is it due to the Coumadin and Lovenox?" Vitals at home were 122/64, no fevers, pain or SOB. Patient is drinking fluids. I instructed daughter to monitor patient and if patient starts complaining about being dizzy, lightheaded, starts vomiting, or BP drops to patient to the ER. Caren Griffins verbalized understanding.

## 2013-12-25 NOTE — Progress Notes (Signed)
New coumadin clinic patient.   [name] is now a patient of Dr. Renella Cunas (previously Dr. Jana Hakim). Patient has been on Xarelto since June 2014 for DVT. She had previously been on coumadin for 2+ years back around 2005 for DVT at that time after a knee procedure. Pt now with recurrent DVT while on Xarelto therapy. Pt recently discharged from [facility] on 9/19. Pt initially started on IV heparin in the hospital and transitioned to coumadin 7.5 mg daily (first dose given on 9/17). Lovenox 120 bid initiated on 9/18 after IV heparin D/C'd.  INR check with Dr. Lona Kettle done on 9/21 which was still Subtherapeutic at 1.6. INR slowly trending up. Pt only has had 4 doses.  I spoke to patient's daughter, Caren Griffins over the phone this morning (# [phone]). She is the main contact for patient and Caren Griffins should be contacted for scheduling info etc.   Pt is doing well with no complaints, she does have some bruising from lovenox injection sites.  Pt will continue lovenox until INR at goal (Goal per Dr. Lona Kettle is 2.5-3 due to recurrent DVT while on xarelto therapy) Pt already took coumadin today. Pt will take 10 mg on Wednesday 12/26/13 (2 tablets) First coumadin clinic visit will be Thursday 12/27/13 (which will equal 1 week on coumadin therapy) at 8:30am for lab and 8:45am for coumadin clinic. Appointments have been made in [facility] Pt has Lovenox 120 mg Rx x 7 days. Pt should have enough lovenox for the rest of the week but if INR remains below goal on Thursday patient may require more lovenox.   Thank you,  Montel Clock, PharmD, BCOP

## 2013-12-27 ENCOUNTER — Other Ambulatory Visit (HOSPITAL_BASED_OUTPATIENT_CLINIC_OR_DEPARTMENT_OTHER): Payer: BC Managed Care – PPO

## 2013-12-27 ENCOUNTER — Ambulatory Visit (HOSPITAL_BASED_OUTPATIENT_CLINIC_OR_DEPARTMENT_OTHER): Payer: BC Managed Care – PPO | Admitting: Pharmacist

## 2013-12-27 DIAGNOSIS — I82409 Acute embolism and thrombosis of unspecified deep veins of unspecified lower extremity: Secondary | ICD-10-CM

## 2013-12-27 LAB — CBC WITH DIFFERENTIAL/PLATELET
BASO%: 1.2 % (ref 0.0–2.0)
Basophils Absolute: 0.2 10*3/uL — ABNORMAL HIGH (ref 0.0–0.1)
EOS%: 3.1 % (ref 0.0–7.0)
Eosinophils Absolute: 0.5 10*3/uL (ref 0.0–0.5)
HCT: 41.1 % (ref 34.8–46.6)
HGB: 13 g/dL (ref 11.6–15.9)
LYMPH%: 55.2 % — ABNORMAL HIGH (ref 14.0–49.7)
MCH: 26.9 pg (ref 25.1–34.0)
MCHC: 31.7 g/dL (ref 31.5–36.0)
MCV: 84.8 fL (ref 79.5–101.0)
MONO#: 0.9 10*3/uL (ref 0.1–0.9)
MONO%: 5.9 % (ref 0.0–14.0)
NEUT#: 5.3 10*3/uL (ref 1.5–6.5)
NEUT%: 34.6 % — ABNORMAL LOW (ref 38.4–76.8)
Platelets: 221 10*3/uL (ref 145–400)
RBC: 4.85 10*6/uL (ref 3.70–5.45)
RDW: 14.8 % — ABNORMAL HIGH (ref 11.2–14.5)
WBC: 15.4 10*3/uL — ABNORMAL HIGH (ref 3.9–10.3)
lymph#: 8.5 10*3/uL — ABNORMAL HIGH (ref 0.9–3.3)

## 2013-12-27 LAB — PROTIME-INR
INR: 1.3 — ABNORMAL LOW (ref 2.00–3.50)
Protime: 15.6 Seconds — ABNORMAL HIGH (ref 10.6–13.4)

## 2013-12-27 LAB — TECHNOLOGIST REVIEW

## 2013-12-27 LAB — POCT INR: INR: 1.3

## 2013-12-27 MED ORDER — ENOXAPARIN SODIUM 120 MG/0.8ML ~~LOC~~ SOLN: 120.0000 mg | Freq: Two times a day (BID) | SUBCUTANEOUS | Status: DC

## 2013-12-27 NOTE — Progress Notes (Signed)
Pt seen for first time in clinic today INR=1.3 Pt has been on Lovenox 120mg  BID since her DC from hospital last weekend (9/18) As well as Coumadin 7.5 mg daily, with 10 mg on Wed (9/23) Pt daughter states she has been n/v with an episode of V this AM right after she took her coumadin. She will take another 10 mg tonight and tomorrow and then decrease to 7.5 mg daily. She has 4 lovenox shots remaining so I called in new script for #20. RPh stated same price so she will have more than she needs to get her therapeutic. Pt did have a UTI in hospital, caught on U/A with no symptoms.  Got Rochepin per med list.  Instructed patient and daughter to watch for symptoms and a fever. WBC continue to rise since hospital d/c, cbc today had them at 15.4 Reviewed diet consistency with patient and daughter. PT and daughter understand plan: Coumadin 10 mg today and tomorrow, 7.5 mg on Sat and Sun Lovenox 120mg  BID until we see her in clinic on Monday, 12/31/13 Gave patient note to return to work on Mon after her appmt

## 2013-12-27 NOTE — Patient Instructions (Signed)
Continue lovenox 120mg  BID and take coumadin 10 mg today and tomorrow.  Then start on coumadin 7.5 mg on Sat and Sun.  RTC on Monday, 12/31/13.

## 2013-12-31 ENCOUNTER — Ambulatory Visit (HOSPITAL_BASED_OUTPATIENT_CLINIC_OR_DEPARTMENT_OTHER): Payer: BC Managed Care – PPO | Admitting: Pharmacist

## 2013-12-31 ENCOUNTER — Other Ambulatory Visit (HOSPITAL_BASED_OUTPATIENT_CLINIC_OR_DEPARTMENT_OTHER): Payer: BC Managed Care – PPO

## 2013-12-31 DIAGNOSIS — I82409 Acute embolism and thrombosis of unspecified deep veins of unspecified lower extremity: Secondary | ICD-10-CM

## 2013-12-31 LAB — PROTIME-INR
INR: 2.7 (ref 2.00–3.50)
Protime: 32.4 s — ABNORMAL HIGH (ref 10.6–13.4)

## 2013-12-31 NOTE — Progress Notes (Signed)
INR = 2.7 Pt is accompanied by her dtr, Caren Griffins today.  Pt is able to speak English well. Pt continues on Lovenox 120 mg SQ BID In the past 7 days, pt has had the following Coumadin doses: 9/21 - 7.5 mg 9/22 - 7.5 mg 9/23 - 10 mg 9/24 - 10 mg; note pt vomited right after taking Coumadin dose so she got additional 10 mg 9/25 - 10 mg 9/26 - 7.5 mg 9/27 - 7.5 mg==> 60 mg total (approx given additional Coumadin on 9/24 due to vomiting) Diet: pt trying to avoid vit K containing foods w/ the exception of green beans.  She really enjoys spinach.  They have a book from the hospital explaining the role of vit K in diet while on Coumadin and they are developing a meal plan.  They understand the amts of vit K need to stay consistent from one week to the next while on Coumadin, avoiding major swings. Meds: she has stopped her multi-vit due to containing vit K.  She also has stopped Naprosyn.  She uses APAP for pain PRN Bruising present on abdomen.  Area of improving bruise/hematoma on RLQ.  Pts dtr reports correct administration of Lovenox inj. INR at goal.  However, her INR just 4 days ago was 1.3 so I think we'll back off of her dose just slightly to 7.5 mg/day ( = 52.5 mg/week). D/C Lovenox (has completed 9 days of LMWH). Liborio Negron Torres for pt to restart multi-vitamin.   I have discussed plan w/ Dr. Lona Kettle & he agrees. Returning [place] w/ MD visit (10/1) so we'll check her INR again then to make sure she's staying on track. Kennith Center, Pharm.D., CPP 12/31/2013@1 :33 PM

## 2014-01-03 ENCOUNTER — Telehealth: Payer: Self-pay | Admitting: Hematology

## 2014-01-03 ENCOUNTER — Other Ambulatory Visit (HOSPITAL_BASED_OUTPATIENT_CLINIC_OR_DEPARTMENT_OTHER): Payer: BC Managed Care – PPO

## 2014-01-03 ENCOUNTER — Encounter: Payer: BC Managed Care – PPO | Admitting: Oncology

## 2014-01-03 ENCOUNTER — Ambulatory Visit (HOSPITAL_BASED_OUTPATIENT_CLINIC_OR_DEPARTMENT_OTHER): Payer: BC Managed Care – PPO | Admitting: Hematology

## 2014-01-03 ENCOUNTER — Ambulatory Visit (HOSPITAL_BASED_OUTPATIENT_CLINIC_OR_DEPARTMENT_OTHER): Payer: Self-pay | Admitting: Pharmacist

## 2014-01-03 ENCOUNTER — Ambulatory Visit: Payer: BC Managed Care – PPO

## 2014-01-03 VITALS — BP 159/83 | HR 58 | Temp 97.9°F | Resp 18 | Ht 60.0 in | Wt 270.4 lb

## 2014-01-03 DIAGNOSIS — I82409 Acute embolism and thrombosis of unspecified deep veins of unspecified lower extremity: Secondary | ICD-10-CM

## 2014-01-03 DIAGNOSIS — I82402 Acute embolism and thrombosis of unspecified deep veins of left lower extremity: Secondary | ICD-10-CM

## 2014-01-03 DIAGNOSIS — Z7901 Long term (current) use of anticoagulants: Secondary | ICD-10-CM

## 2014-01-03 LAB — POCT INR: INR: 2.1

## 2014-01-03 LAB — PROTIME-INR
INR: 2.1 (ref 2.00–3.50)
Protime: 25.2 Seconds — ABNORMAL HIGH (ref 10.6–13.4)

## 2014-01-03 NOTE — Patient Instructions (Addendum)
INR slightly below goal but greater than 2 (2.1 today)  Plan: Take coumadin 7.5 mg daily except for 10 mg on Tuesdays and Thursdays. Return 01/11/14 at 3pm for lab; 3:15pm for Coumadin clinic

## 2014-01-03 NOTE — Progress Notes (Signed)
INR slightly below goal but greater than 2 (2.1 today) Goal is 2.5-3 Pt seen with Daughter today Pt is doing well with no complaints Still some bruising on stomach from lovenox injections Pt to see Dr. Lona Kettle today as well No other unusual bleeding or bruising No missed or extra doses Pt has taken 7.5 mg daily as instructed since Monday Multivitamin restarted No other diet or medication changes Will make slight dose increase Plan: Take coumadin 7.5 mg daily except for 10 mg on Tuesdays and Thursdays. Return 01/11/14 at 3pm for lab; 3:15pm for Coumadin clinic

## 2014-01-03 NOTE — Telephone Encounter (Signed)
, °

## 2014-01-04 NOTE — Progress Notes (Signed)
[facility] NOTE 01/03/2014  Patient Care Team: Merrilee Seashore, MD as PCP - General (Internal Medicine)  REASON FOR OFFICE VISIT:   Recurrent DVT on Xarelto. Now on Coumadin. Hospital follow up.  ASSESSMENT/PLAN:   79. 73 years old Hispanic female admitted for near-syncope episode on 12/18/2013 at [facility]. She has prior history of DVT and TIA and was on Xarelto. CT Brain was negative, EKG normal. Doppler showed an acute DVT in Right posterior tibial vein, left posterior tibial vein and left peroneal vein . She was on Lovenox and Warfarin. She was discharged home on Coumadin with an INR Goal of 2.5-3.5. Question was about the anticoagulation strategy moving forward and etiology of the clot. Today she comes for a hospital Follow up.  2. Shirley [name] has a long history of obesity and sedentary lifestyle. She has had significant knee problems which have kept her from exercising and of course got worse since the weight problem which further worsens the knee problems. In August of 2005 she had right knee surgery and shortly thereafter was diagnosed with a left lower extremity DVT. She was treated with Coumadin for 30 months.   3. In June of 2014 she complained of pain in her right leg. There had been no specific provoking event leading to this symptom. Evaluation showed a DVT again in the left leg. She was started on rivaroxaban (Xarelto). She was tolerating the anticoagulation well. She was referred to Dr Jana Hakim and seen on 03/21/2013 to discuss anticoagulation and he recommended weight loss and to consider bariatric surgery and to continue the life long anticoagulation. The hypercoagulable panel testing done on 02/22/2013 was negative for any known thrombophilia specifically it was negative for FVL mutation, prothrombin gene mutation, LAC, antiphospholipid syndrome, protein C, S and AT-III deficiency.   4. Patient is up to date with her screenings like  mammogram, colonoscopy and pap smears. There is no family history of cancers or blood clots.   5. She needs to continue the Coumadin life-long now and the INR goal should be kept between 2.5-3.0 range. She does not appear to have a hypercoagulable state or any underlying malignancy and Compliance to the medication does not appear to be an issue but at least with Coumadin she will have ongoing monitoring of her INR and we can better follow and establish that her anticoagulation goal is being met. Patient is now being monitored at Coumadin Clinic here at [facility].  6.  We will see her back in 3 months and repeat a CBC and a quantitative D-dimer.   HISTORY OF PRESENTING ILLNESS:   Shirley [name] is a 73 y.o. female who presents to the ED after an episode of near syncope at home. Collapsed to ground but no LOC. No injury with this. Had some upper left-sided headache but this is like her usual headaches. Has h/o DVT x2 in the past, on chronic xarelto, legs were swollen. Bilateral lower extremity venous duplex completed. Bilateral lower extremities are positive for deep vein thrombosis involving bilateral posterior tibial and left peroneal veins. There is no evidence of Baker's cyst bilaterally. CTA without pulmonary embolism. Placed on IV heparin 12.5 ml/hr and Warfarin 7.5 mg/daily (dosing per pharmacy), Xarelto discontinued. Now patient on coumadin.     MEDICAL HISTORY:  Past Medical History  Diagnosis Date  . Hypertension   . Hemorrhoid   . GERD (gastroesophageal reflux disease)   . Adenomatous polyps   . DVT (deep  venous thrombosis) 2003    lower extremity DVT with questionable recurrence in 2005. Treated with 1.5 year course of coumadin.  [name] History of TIAs     2004 - no documentation available. 09/2010 -  was not on aspirin or statin therapy prior to event.  . Endometrial thickening on ultra sound 07/2009    path showing Fragments of benign endocervical and squamous mucosa.  No dysplasia or malignancy // Followed by Dr. [name]  . Coronary artery disease, non-occlusive 11/2003    LHC (11/2003) - LAD diffuse 30% stenosis of mid-vessel. RCA with 30% stenosis of proximal vessel - by Dr. Dannielle Burn. // 2D Echo (04/2006) -  LV EF 65%.  Mild LVH. No  wall motion abnormalities.  Mild diastolic dysfunction.   . Prediabetes DX: 09/2010    HgA1c 6.1  . Stroke   . Syncope 12/18/2013  Sleep apnea SURGICAL HISTORY: Past Surgical History  Procedure Laterality Date  . Tubal ligation    . Knee surgery  1996    Left knee arthroscopy.  . Colonoscopy w/ polypectomy  11/01/2007    8 mm rectal adenoma, hemorrhoids  . Rotator cuff repair      right.    SOCIAL HISTORY: History   Social History  . Marital Status: Married    Spouse Name: N/A    Number of Children: 6  . Years of Education: College   Occupational History  . Control and instrumentation engineer   . [employer]   Social History Main Topics  . Smoking status: Never Smoker   . Smokeless tobacco: Never Used  . Alcohol Use: No  . Drug Use: No  . Sexual Activity: Not on file   Other Topics Concern  . Not on file   Social History Narrative   Full Code status.   Insurance: BCBS    FAMILY HISTORY: Family History  Problem Relation Age of Onset  . Thyroid cancer Sister   . Liver cancer Sister   . Diabetes Mother   . Hyperlipidemia Mother   . Heart disease Mother   . Dementia Mother   . Angina Father   . Heart disease Brother     x 2 in 13s yo  . Alzheimer's disease Maternal Grandmother     ALLERGIES:  is allergic to tylenol.  MEDICATIONS:  Current Outpatient Prescriptions  Medication Sig Dispense Refill  . acetaminophen (TYLENOL) 500 MG tablet Take 500 mg by mouth every 6 (six) hours as needed.      [name] lisinopril-hydrochlorothiazide (PRINZIDE,ZESTORETIC) 10-12.5 MG per tablet Take 1 tablet by mouth every morning.       . metoprolol succinate (TOPROL-XL) 25 MG 24 hr tablet  Take 1 tablet (25 mg total) by mouth daily. Take with or immediately following a meal.  30 tablet  3  . Multiple Vitamin (MULTIVITAMIN WITH MINERALS) TABS Take 1 tablet by mouth every morning.      [name] omeprazole (PRILOSEC) 40 MG capsule Take 40 mg by mouth daily.      . promethazine-phenylephrine (PROMETHAZINE-PHENYLEPHRINE) 6.25-5 MG/5ML SYRP Take 5 mLs by mouth every 6 (six) hours as needed for congestion.      . traMADol (ULTRAM) 50 MG tablet Take 1 tablet (50 mg total) by mouth every 6 (six) hours as needed for moderate pain.  60 tablet  0  . warfarin (COUMADIN) 5 MG tablet Take 1.5 tablets (7.5 mg total) by mouth daily. Dose to be adjusted according to PT/INR  30 tablet  0  No current facility-administered medications for this visit.    REVIEW OF SYSTEMS:   Constitutional: Denies fevers, chills or abnormal night sweats Eyes: Denies blurriness of vision, double vision or watery eyes Ears, nose, mouth, throat, and face: Denies mucositis or sore throat Respiratory: Denies cough, dyspnea or wheezes Cardiovascular: Denies palpitation, chest discomfort or lower extremity swelling Gastrointestinal:  Denies nausea, heartburn or change in bowel habits Skin: Denies abnormal skin rashes Lymphatics: Denies new lymphadenopathy or easy bruising Neurological:Denies numbness, tingling or new weaknesses Behavioral/Psych: Mood is stable, no new changes  All other systems were reviewed with the patient and are negative.  PHYSICAL EXAMINATION: ECOG PERFORMANCE STATUS: 0  Filed Vitals:   01/03/14 1002  BP: 159/83  Pulse: 58  Temp: 97.9 F (36.6 C)  Resp: 18   Filed Weights   01/03/14 1002  Weight: 270 lb 6.4 oz (122.653 kg)    GENERAL:alert, no distress and comfortable, obese+ SKIN: skin color, texture, turgor are normal, no rashes or significant lesions EYES: normal, conjunctiva are pink and non-injected, sclera clear OROPHARYNX:no exudate, no erythema and lips, buccal mucosa, and tongue  normal  NECK: supple, thyroid normal size, non-tender, without nodularity LYMPH:  no palpable lymphadenopathy in the cervical, axillary or inguinal LUNGS: clear to auscultation and percussion with normal breathing effort HEART: regular rate & rhythm and no murmurs and no lower extremity edema ABDOMEN:abdomen soft, non-tender and normal bowel sounds Musculoskeletal:no cyanosis of digits and no clubbing  PSYCH: alert & oriented x 3 with fluent speech NEURO: no focal motor/sensory deficits  LABORATORY DATA:  I have reviewed the data as listed Lab Results  Component Value Date   WBC 15.4* 12/27/2013   HGB 13.0 12/27/2013   HCT 41.1 12/27/2013   MCV 84.8 12/27/2013   PLT 221 12/27/2013    Recent Labs  05/28/13 1925 12/18/13 2234  NA 140 140  K 4.1 4.2  CL 101 103  CO2 26 27  GLUCOSE 113* 114*  BUN 17 12  CREATININE 0.64 0.64  CALCIUM 9.4 8.8  GFRNONAA 87* 86*  GFRAA >90 >90  PROT 7.2 6.6  ALBUMIN 3.4* 3.0*  AST 18 18  ALT 23 17  ALKPHOS 114 108  BILITOT 0.2* 0.2*        RADIOGRAPHIC STUDIES: I have personally reviewed the radiological images as listed and agreed with the findings in the report. Dg Chest 2 View  12/18/2013   CLINICAL DATA:  Chest pain, cough.  Syncope.  EXAM: CHEST  2 VIEW  COMPARISON:  Chest radiograph performed 09/15/2012  FINDINGS: The lungs are well-aerated and clear. There is no evidence of focal opacification, pleural effusion or pneumothorax.  The heart is borderline enlarged. No acute osseous abnormalities are seen. The patient is status post right-sided rotator cuff repair.  IMPRESSION: Borderline cardiomegaly; no acute cardiopulmonary process seen.   Electronically Signed   By: Garald Balding M.D.   On: 12/18/2013 22:39   Ct Head Wo Contrast  12/19/2013   CLINICAL DATA:  Dizzy spell. Fell forward. No loss of consciousness. No injury. No loss of consciousness.  EXAM: CT HEAD WITHOUT CONTRAST  TECHNIQUE: Contiguous axial images were obtained from the  base of the skull through the vertex without intravenous contrast.  COMPARISON:  CT of the head 05/28/2013  FINDINGS: There is no intra or extra-axial fluid collection or mass lesion. The basilar cisterns and ventricles have a normal appearance. There is no CT evidence for acute infarction or hemorrhage. There is atherosclerotic calcification of the  internal carotid arteries. Bone windows are otherwise unremarkable.  IMPRESSION: 1.  No evidence for acute intracranial abnormality. 2. No calvarial fracture.   Electronically Signed   By: Shon Hale M.D.   On: 12/19/2013 00:15   Ct Angio Chest Pe W/cm &/or Wo Cm  12/20/2013   CLINICAL DATA:  D-dimer 0.58. Near syncope. Dizziness. History of DVT. Possible PE.  EXAM: CT ANGIOGRAPHY CHEST WITH CONTRAST  TECHNIQUE: Multidetector CT imaging of the chest was performed using the standard protocol during bolus administration of intravenous contrast. Multiplanar CT image reconstructions and MIPs were obtained to evaluate the vascular anatomy.  CONTRAST:  157m OMNIPAQUE IOHEXOL 350 MG/ML SOLN  COMPARISON:  None.  FINDINGS: Heart: Coronary calcifications are present. Heart is enlarged. No pericardial effusion  Vascular structures: Pulmonary arteries are well opacified. There is no evidence for acute pulmonary embolus. Bovine arch anatomy. Tortuous thoracic aorta.  Mediastinum/thyroid: No mediastinal, hilar, or axillary adenopathy. The visualized portion of the thyroid gland has a normal appearance.  Lungs/Airways: No focal consolidations or pleural effusions. There is a 5 mm nodule seen at the periphery of the lingula. No other pulmonary nodules are identified.  Upper abdomen: Hiatal is present  Chest wall/osseous structures: Degenerative changes are seen in the thoracic spine. No suspicious abnormalities.  Review of the MIP images confirms the above findings.  IMPRESSION: 1. Technically adequate exam showing no pulmonary embolus. 2. Cardiomegaly.  Coronary artery  calcifications. 3. Bovine arch anatomy incidentally noted. 4. Tortuous thoracic aorta. 5. 5 mm nodule within the lingula. If the patient is at high risk for bronchogenic carcinoma, follow-up chest CT at 6-12 months is recommended. If the patient is at low risk for bronchogenic carcinoma, follow-up chest CT at 12 months is recommended. This recommendation follows the consensus statement: Guidelines for Management of Small Pulmonary Nodules Detected on CT Scans: A Statement from the [employer] published in Radiology 2005;237:395-400. 6. Hiatal hernia.   Electronically Signed   By: BShon HaleM.D.   On: 12/20/2013 01:03   [place]  12/21/2013   CLINICAL DATA:  The provided history states the patient has a history of renal lesion. There is no prior imaging at this institution indicating further details regarding a possible renal abnormality.  EXAM: RENAL/URINARY TRACT ULTRASOUND COMPLETE  COMPARISON:  None.  FINDINGS: Right Kidney:  Length: 11.0 cm. Hypoechoic oval circumscribed right mid renal cortical lesion weight is identified measuring 9 x 7 x 6 mm. Detail obscured by patient body habitus. No hydronephrosis.  Left Kidney:  Length: 10.2 cm. Echogenicity within normal limits. No mass or hydronephrosis visualized.  Bladder:  Decompressed.  Patient voided prior to the exam.  IMPRESSION: Probable sub cm cyst right mid kidney, suboptimally evaluated at ultrasound due to patient body habitus and lesion small size. Comparison to presumed outside prior exam would be helpful to determine if any further followup is needed. Consider follow-up ultrasound in 1 year for surveillance if outside studies cannot be obtained.   Electronically Signed   By: GConchita ParisM.D.   On: 12/21/2013 11:02    All questions were answered. The patient knows to call the clinic with any problems, questions or concerns. I spent 30 minutes counseling the patient face to face. The total time spent in the appointment was 45  minutes.    ABernadene Bell MD Medical Hematologist/Oncologist COld WestburyPager: [phone] No: [phone]

## 2014-01-05 ENCOUNTER — Encounter: Payer: Self-pay | Admitting: Hematology

## 2014-01-11 ENCOUNTER — Ambulatory Visit (HOSPITAL_BASED_OUTPATIENT_CLINIC_OR_DEPARTMENT_OTHER): Payer: BC Managed Care – PPO | Admitting: Pharmacist

## 2014-01-11 ENCOUNTER — Other Ambulatory Visit: Payer: BC Managed Care – PPO

## 2014-01-11 DIAGNOSIS — I82409 Acute embolism and thrombosis of unspecified deep veins of unspecified lower extremity: Secondary | ICD-10-CM

## 2014-01-11 LAB — POCT INR: INR: 4.1

## 2014-01-11 LAB — PROTIME-INR
INR: 4.1 — ABNORMAL HIGH (ref 2.00–3.50)
Protime: 49.2 Seconds — ABNORMAL HIGH (ref 10.6–13.4)

## 2014-01-11 NOTE — Patient Instructions (Signed)
Hold coumadin tomorrow (10/10) since you took your coumadin this morning. On 10/11, begin coumadin 7.5mg  daily except 5mg  on Su&Wed.   Return 01/21/14 at 3:15pm for lab; 3:30pm for Coumadin clinic

## 2014-01-11 NOTE — Progress Notes (Addendum)
INR above goal today. INR goal = 2.5-3. Pt took coumadin 7.5mg  daily (not 7.5mg  daily except 10mg  on Tu&Thu as documented).  Pt daughter Caren Griffins) confirmed this dose. No changes in diet or medications. No missed coumadin doses. No unusual bleeding. No bruising noted. Pt feels very tired today. No problems or concerns regarding anticoagulation. Caren Griffins requested a note from Dr. Lona Kettle to excuse patient from work From 12/31/13 - 10/2. She already has a note from the hospitalist excusing her from work during her hospital stay. Will give this information to Dr. Renella Cunas nurse. Caren Griffins requests a phone call to [phone] when note is complete and she will pick it up. Hold coumadin tomorrow (10/10). Pt took coumadin this morning. On 10/11, begin coumadin 7.5mg  daily except 5mg  on Su&Wed.   Return 01/21/14 at 3:15pm for lab; 3:30pm for Coumadin clinic  Refill prescription called to the [place] at [facility]. Phone:  [phone]

## 2014-01-12 MED ORDER — WARFARIN SODIUM 5 MG PO TABS: 5.0000 mg | ORAL_TABLET | ORAL | Status: DC

## 2014-01-12 NOTE — Addendum Note (Signed)
Addended by: Neysa Hotter on: 01/12/2014 09:37 AM   Modules accepted: Orders, Medications

## 2014-01-14 ENCOUNTER — Encounter: Payer: Self-pay | Admitting: *Deleted

## 2014-01-21 ENCOUNTER — Other Ambulatory Visit (HOSPITAL_BASED_OUTPATIENT_CLINIC_OR_DEPARTMENT_OTHER): Payer: BC Managed Care – PPO

## 2014-01-21 ENCOUNTER — Ambulatory Visit (HOSPITAL_BASED_OUTPATIENT_CLINIC_OR_DEPARTMENT_OTHER): Payer: Self-pay | Admitting: Pharmacist

## 2014-01-21 DIAGNOSIS — I82402 Acute embolism and thrombosis of unspecified deep veins of left lower extremity: Secondary | ICD-10-CM

## 2014-01-21 DIAGNOSIS — I82409 Acute embolism and thrombosis of unspecified deep veins of unspecified lower extremity: Secondary | ICD-10-CM

## 2014-01-21 LAB — POCT INR: INR: 2.6

## 2014-01-21 LAB — PROTIME-INR
INR: 2.6 (ref 2.00–3.50)
PROTIME: 31.2 s — AB (ref 10.6–13.4)

## 2014-01-21 NOTE — Progress Notes (Signed)
INR at goal today.  No bleeding or unusual bruising.  No med changes.  Will continue current coumadin dose of 5mg  Sun/Wed and 7.5mg  other days.  Will check PT/INR in 2 weeks.

## 2014-01-22 ENCOUNTER — Other Ambulatory Visit: Payer: BC Managed Care – PPO

## 2014-01-22 ENCOUNTER — Telehealth: Payer: Self-pay | Admitting: Hematology

## 2014-01-22 ENCOUNTER — Ambulatory Visit: Payer: BC Managed Care – PPO

## 2014-01-22 NOTE — Telephone Encounter (Signed)
per Melissa to sch pt CC-pt aware

## 2014-01-24 ENCOUNTER — Other Ambulatory Visit: Payer: Self-pay

## 2014-01-24 ENCOUNTER — Encounter (HOSPITAL_COMMUNITY)
Admission: EM | Disposition: A | Discharge status: Home / Self Care | Payer: BC Managed Care – PPO | Source: Home / Self Care | Attending: Cardiovascular Disease

## 2014-01-24 ENCOUNTER — Emergency Department (HOSPITAL_COMMUNITY): Payer: BC Managed Care – PPO

## 2014-01-24 ENCOUNTER — Encounter (HOSPITAL_COMMUNITY): Payer: Self-pay | Admitting: Emergency Medicine

## 2014-01-24 ENCOUNTER — Inpatient Hospital Stay (HOSPITAL_COMMUNITY)
Admission: EM | Admit: 2014-01-24 | Discharge: 2014-01-27 | DRG: 248 | Disposition: A | Discharge status: Home / Self Care | Payer: BC Managed Care – PPO | Attending: Cardiovascular Disease | Admitting: Cardiovascular Disease

## 2014-01-24 DIAGNOSIS — Z86718 Personal history of other venous thrombosis and embolism: Secondary | ICD-10-CM

## 2014-01-24 DIAGNOSIS — Z6841 Body Mass Index (BMI) 40.0 and over, adult: Secondary | ICD-10-CM | POA: Diagnosis not present

## 2014-01-24 DIAGNOSIS — R079 Chest pain, unspecified: Secondary | ICD-10-CM | POA: Diagnosis present

## 2014-01-24 DIAGNOSIS — I517 Cardiomegaly: Secondary | ICD-10-CM | POA: Diagnosis not present

## 2014-01-24 DIAGNOSIS — I2111 ST elevation (STEMI) myocardial infarction involving right coronary artery: Secondary | ICD-10-CM | POA: Diagnosis not present

## 2014-01-24 DIAGNOSIS — I10 Essential (primary) hypertension: Secondary | ICD-10-CM | POA: Diagnosis present

## 2014-01-24 DIAGNOSIS — Z8249 Family history of ischemic heart disease and other diseases of the circulatory system: Secondary | ICD-10-CM | POA: Diagnosis not present

## 2014-01-24 DIAGNOSIS — Z7901 Long term (current) use of anticoagulants: Secondary | ICD-10-CM | POA: Diagnosis not present

## 2014-01-24 DIAGNOSIS — K219 Gastro-esophageal reflux disease without esophagitis: Secondary | ICD-10-CM | POA: Diagnosis present

## 2014-01-24 DIAGNOSIS — I2510 Atherosclerotic heart disease of native coronary artery without angina pectoris: Secondary | ICD-10-CM | POA: Diagnosis not present

## 2014-01-24 DIAGNOSIS — N390 Urinary tract infection, site not specified: Secondary | ICD-10-CM | POA: Diagnosis present

## 2014-01-24 DIAGNOSIS — D689 Coagulation defect, unspecified: Secondary | ICD-10-CM | POA: Diagnosis not present

## 2014-01-24 DIAGNOSIS — I2119 ST elevation (STEMI) myocardial infarction involving other coronary artery of inferior wall: Secondary | ICD-10-CM

## 2014-01-24 DIAGNOSIS — E785 Hyperlipidemia, unspecified: Secondary | ICD-10-CM | POA: Diagnosis present

## 2014-01-24 DIAGNOSIS — G4733 Obstructive sleep apnea (adult) (pediatric): Secondary | ICD-10-CM | POA: Diagnosis not present

## 2014-01-24 DIAGNOSIS — R739 Hyperglycemia, unspecified: Secondary | ICD-10-CM

## 2014-01-24 DIAGNOSIS — I82409 Acute embolism and thrombosis of unspecified deep veins of unspecified lower extremity: Secondary | ICD-10-CM | POA: Diagnosis present

## 2014-01-24 DIAGNOSIS — I4901 Ventricular fibrillation: Secondary | ICD-10-CM | POA: Diagnosis present

## 2014-01-24 DIAGNOSIS — Z8673 Personal history of transient ischemic attack (TIA), and cerebral infarction without residual deficits: Secondary | ICD-10-CM | POA: Diagnosis not present

## 2014-01-24 DIAGNOSIS — I2582 Chronic total occlusion of coronary artery: Secondary | ICD-10-CM | POA: Diagnosis present

## 2014-01-24 DIAGNOSIS — I213 ST elevation (STEMI) myocardial infarction of unspecified site: Secondary | ICD-10-CM | POA: Insufficient documentation

## 2014-01-24 DIAGNOSIS — I2129 ST elevation (STEMI) myocardial infarction involving other sites: Secondary | ICD-10-CM | POA: Diagnosis not present

## 2014-01-24 HISTORY — PX: CORONARY ANGIOPLASTY WITH STENT PLACEMENT: SHX49

## 2014-01-24 HISTORY — DX: ST elevation (STEMI) myocardial infarction involving other coronary artery of inferior wall: I21.19

## 2014-01-24 HISTORY — PX: LEFT HEART CATHETERIZATION WITH CORONARY ANGIOGRAM: SHX5451

## 2014-01-24 LAB — POCT ACTIVATED CLOTTING TIME: Activated Clotting Time: 630 seconds

## 2014-01-24 LAB — BASIC METABOLIC PANEL
Anion gap: 17 — ABNORMAL HIGH (ref 5–15)
Anion gap: 10 (ref 5–15)
BUN: 16 mg/dL (ref 6–23)
BUN: 12 mg/dL (ref 6–23)
CALCIUM: 8.9 mg/dL (ref 8.4–10.5)
CO2: 19 mEq/L (ref 19–32)
CO2: 23 mEq/L (ref 19–32)
Calcium: 8.1 mg/dL — ABNORMAL LOW (ref 8.4–10.5)
Chloride: 98 mEq/L (ref 96–112)
Chloride: 102 mEq/L (ref 96–112)
Creatinine, Ser: 0.64 mg/dL (ref 0.50–1.10)
Creatinine, Ser: 0.59 mg/dL (ref 0.50–1.10)
GFR calc Af Amer: >90 mL/min (ref >90)
GFR calc non Af Amer: 89 mL/min — ABNORMAL LOW (ref >90)
GFR, EST NON AFRICAN AMERICAN: 86 mL/min — AB (ref >90)
GLUCOSE: 124 mg/dL — AB (ref 70–99)
Glucose, Bld: 146 mg/dL — ABNORMAL HIGH (ref 70–99)
POTASSIUM: 4.2 meq/L (ref 3.7–5.3)
POTASSIUM: 4.5 meq/L (ref 3.7–5.3)
SODIUM: 134 meq/L — AB (ref 137–147)
Sodium: 135 mEq/L — ABNORMAL LOW (ref 137–147)

## 2014-01-24 LAB — CBC
HCT: 38.6 % (ref 36.0–46.0)
HEMATOCRIT: 35.8 % — AB (ref 36.0–46.0)
HEMOGLOBIN: 11.7 g/dL — AB (ref 12.0–15.0)
Hemoglobin: 12.6 g/dL (ref 12.0–15.0)
MCH: 26.8 pg (ref 26.0–34.0)
MCH: 27.5 pg (ref 26.0–34.0)
MCHC: 32.6 g/dL (ref 30.0–36.0)
MCHC: 32.7 g/dL (ref 30.0–36.0)
MCV: 82 fL (ref 78.0–100.0)
MCV: 84 fL (ref 78.0–100.0)
Platelets: 276 10*3/uL (ref 150–400)
Platelets: 248 10*3/uL (ref 150–400)
RBC: 4.71 MIL/uL (ref 3.87–5.11)
RBC: 4.26 MIL/uL (ref 3.87–5.11)
RDW: 14 % (ref 11.5–15.5)
RDW: 14.2 % (ref 11.5–15.5)
WBC: 21.1 10*3/uL — ABNORMAL HIGH (ref 4.0–10.5)
WBC: 14.4 10*3/uL — AB (ref 4.0–10.5)

## 2014-01-24 LAB — TROPONIN I
TROPONIN I: 15.49 ng/mL — AB (ref <0.30)
Troponin I: 14.36 ng/mL (ref <0.30)
Troponin I: 15.17 ng/mL (ref <0.30)

## 2014-01-24 LAB — PROTIME-INR
INR: 2.36 — ABNORMAL HIGH (ref 0.00–1.49)
Prothrombin Time: 26.1 seconds — ABNORMAL HIGH (ref 11.6–15.2)

## 2014-01-24 LAB — MRSA PCR SCREENING: MRSA BY PCR: NEGATIVE

## 2014-01-24 LAB — I-STAT TROPONIN, ED: Troponin i, poc: 0.01 ng/mL (ref 0.00–0.08)

## 2014-01-24 SURGERY — LEFT HEART CATHETERIZATION WITH CORONARY ANGIOGRAM
Anesthesia: LOCAL

## 2014-01-24 MED ORDER — ONDANSETRON HCL 4 MG/2ML IJ SOLN
4.0000 mg | Freq: Once | INTRAMUSCULAR | Status: AC
  Administered 2014-01-24: 4 mg via INTRAVENOUS

## 2014-01-24 MED ORDER — HEPARIN (PORCINE) IN NACL 2-0.9 UNIT/ML-% IJ SOLN
INTRAMUSCULAR | Status: AC
  Filled 2014-01-24: qty 1000

## 2014-01-24 MED ORDER — BIVALIRUDIN 250 MG IV SOLR
INTRAVENOUS | Status: AC
  Filled 2014-01-24: qty 250

## 2014-01-24 MED ORDER — MORPHINE SULFATE 2 MG/ML IJ SOLN
2.0000 mg | INTRAMUSCULAR | Status: DC | PRN
  Administered 2014-01-24 – 2014-01-26 (×6): 2 mg via INTRAVENOUS
  Filled 2014-01-24 (×6): qty 1

## 2014-01-24 MED ORDER — ASPIRIN 81 MG PO CHEW
81.0000 mg | CHEWABLE_TABLET | Freq: Every day | ORAL | Status: DC
  Administered 2014-01-24 – 2014-01-27 (×4): 81 mg via ORAL
  Filled 2014-01-24 (×4): qty 1

## 2014-01-24 MED ORDER — HEPARIN SODIUM (PORCINE) 5000 UNIT/ML IJ SOLN
4000.0000 [IU] | Freq: Once | INTRAMUSCULAR | Status: AC
  Administered 2014-01-24: 4000 [IU] via INTRAVENOUS

## 2014-01-24 MED ORDER — PANTOPRAZOLE SODIUM 40 MG PO TBEC
40.0000 mg | DELAYED_RELEASE_TABLET | Freq: Every day | ORAL | Status: DC
  Administered 2014-01-24 – 2014-01-27 (×4): 40 mg via ORAL
  Filled 2014-01-24 (×4): qty 1

## 2014-01-24 MED ORDER — LIDOCAINE HCL (PF) 1 % IJ SOLN
INTRAMUSCULAR | Status: AC
  Filled 2014-01-24: qty 30

## 2014-01-24 MED ORDER — NITROGLYCERIN 1 MG/10 ML FOR IR/CATH LAB
INTRA_ARTERIAL | Status: AC
  Filled 2014-01-24: qty 10

## 2014-01-24 MED ORDER — SODIUM CHLORIDE 0.9 % IV SOLN
INTRAVENOUS | Status: AC
  Administered 2014-01-24 (×2): via INTRAVENOUS

## 2014-01-24 MED ORDER — SODIUM CHLORIDE 0.9 % IV SOLN
INTRAVENOUS | Status: AC | PRN
  Administered 2014-01-24: 999 mL/h via INTRAVENOUS

## 2014-01-24 MED ORDER — ASPIRIN EC 81 MG PO TBEC: 81.0000 mg | DELAYED_RELEASE_TABLET | Freq: Every day | ORAL | Status: DC

## 2014-01-24 MED ORDER — ASPIRIN 81 MG PO CHEW
324.0000 mg | CHEWABLE_TABLET | Freq: Once | ORAL | Status: AC
  Administered 2014-01-24: 324 mg via ORAL

## 2014-01-24 MED ORDER — ATORVASTATIN CALCIUM 80 MG PO TABS
80.0000 mg | ORAL_TABLET | Freq: Every day | ORAL | Status: DC
  Administered 2014-01-24 – 2014-01-26 (×3): 80 mg via ORAL
  Filled 2014-01-24 (×4): qty 1

## 2014-01-24 MED ORDER — WARFARIN SODIUM 7.5 MG PO TABS
7.5000 mg | ORAL_TABLET | Freq: Once | ORAL | Status: AC
  Administered 2014-01-24: 7.5 mg via ORAL
  Filled 2014-01-24: qty 1

## 2014-01-24 MED ORDER — WARFARIN - PHARMACIST DOSING INPATIENT
Freq: Every day | Status: DC
Start: 2014-01-24 — End: 2014-01-27
  Administered 2014-01-25: 18:00:00

## 2014-01-24 MED ORDER — ASPIRIN 81 MG PO CHEW
CHEWABLE_TABLET | ORAL | Status: AC
  Filled 2014-01-24: qty 4

## 2014-01-24 MED ORDER — METOPROLOL SUCCINATE ER 25 MG PO TB24
25.0000 mg | ORAL_TABLET | Freq: Every day | ORAL | Status: DC
  Administered 2014-01-24 – 2014-01-27 (×4): 25 mg via ORAL
  Filled 2014-01-24 (×4): qty 1

## 2014-01-24 MED ORDER — FENTANYL CITRATE 0.05 MG/ML IJ SOLN
INTRAMUSCULAR | Status: AC
  Filled 2014-01-24: qty 2

## 2014-01-24 MED ORDER — MIDAZOLAM HCL 2 MG/2ML IJ SOLN
INTRAMUSCULAR | Status: AC
  Filled 2014-01-24: qty 2

## 2014-01-24 MED ORDER — ONDANSETRON HCL 4 MG/2ML IJ SOLN
4.0000 mg | Freq: Four times a day (QID) | INTRAMUSCULAR | Status: DC | PRN
  Filled 2014-01-24: qty 2

## 2014-01-24 MED ORDER — CLOPIDOGREL BISULFATE 300 MG PO TABS
ORAL_TABLET | ORAL | Status: AC
  Administered 2014-01-24: 75 mg via ORAL
  Filled 2014-01-24: qty 2

## 2014-01-24 MED ORDER — ACETAMINOPHEN 325 MG PO TABS: 650.0000 mg | ORAL_TABLET | ORAL | Status: DC | PRN

## 2014-01-24 MED ORDER — NITROGLYCERIN 0.4 MG SL SUBL: 0.4000 mg | SUBLINGUAL_TABLET | SUBLINGUAL | Status: DC | PRN

## 2014-01-24 MED ORDER — SODIUM CHLORIDE 0.9 % IV SOLN
0.2500 mg/kg/h | INTRAVENOUS | Status: AC
  Administered 2014-01-24: 0.25 mg/kg/h via INTRAVENOUS

## 2014-01-24 MED ORDER — VERAPAMIL HCL 2.5 MG/ML IV SOLN
INTRAVENOUS | Status: AC
  Filled 2014-01-24: qty 2

## 2014-01-24 MED ORDER — CLOPIDOGREL BISULFATE 75 MG PO TABS
75.0000 mg | ORAL_TABLET | Freq: Every day | ORAL | Status: DC
  Administered 2014-01-24 – 2014-01-27 (×4): 75 mg via ORAL
  Filled 2014-01-24 (×6): qty 1

## 2014-01-24 MED ORDER — DEXTROSE 5 % IV SOLN
150.0000 mg | INTRAVENOUS | Status: AC | PRN
  Administered 2014-01-24: 150 mg via INTRAVENOUS

## 2014-01-24 MED ORDER — CLOPIDOGREL BISULFATE 300 MG PO TABS
300.0000 mg | ORAL_TABLET | Freq: Every day | ORAL | Status: DC
Start: 2014-01-24 — End: 2014-01-24
  Filled 2014-01-24: qty 1

## 2014-01-24 MED ORDER — ONDANSETRON HCL 4 MG/2ML IJ SOLN: 4.0000 mg | Freq: Four times a day (QID) | INTRAMUSCULAR | Status: DC | PRN

## 2014-01-24 MED ORDER — AMIODARONE HCL IN DEXTROSE 360-4.14 MG/200ML-% IV SOLN
INTRAVENOUS | Status: AC
  Filled 2014-01-24: qty 200

## 2014-01-24 MED ORDER — SODIUM CHLORIDE 0.9 % IV SOLN
0.2500 mg/kg/h | INTRAVENOUS | Status: DC
  Filled 2014-01-24: qty 250

## 2014-01-24 NOTE — Discharge Instructions (Addendum)
Information on my medicine - Coumadin   (Warfarin)  This medication education was reviewed with me or my healthcare representative as part of my discharge preparation.  The pharmacist that spoke with me during my hospital stay was:  Von Nils Flack, RPH  Why was Coumadin prescribed for you? Coumadin was prescribed for you because you have a blood clot or a medical condition that can cause an increased risk of forming blood clots. Blood clots can cause serious health problems by blocking the flow of blood to the heart, lung, or brain. Coumadin can prevent harmful blood clots from forming. As a reminder your indication for Coumadin is:   Deep Vein Thrombosis Treatment  What test will check on my response to Coumadin? While on Coumadin (warfarin) you will need to have an INR test regularly to ensure that your dose is keeping you in the desired range. The INR (international normalized ratio) number is calculated from the result of the laboratory test called prothrombin time (PT).  If an INR APPOINTMENT HAS NOT ALREADY BEEN MADE FOR YOU please schedule an appointment to have this lab work done by your health care provider within 7 days. Your INR goal is usually a number between:  2 to 3 or your provider may give you a more narrow range like 2-2.5.  Ask your health care provider during an office visit what your goal INR is.  What  do you need to  know  About  COUMADIN? Take Coumadin (warfarin) exactly as prescribed by your healthcare provider about the same time each day.  DO NOT stop taking without talking to the doctor who prescribed the medication.  Stopping without other blood clot prevention medication to take the place of Coumadin may increase your risk of developing a new clot or stroke.  Get refills before you run out.  What do you do if you miss a dose? If you miss a dose, take it as soon as you remember on the same day then continue your regularly scheduled regimen the next day.  Do not  take two doses of Coumadin at the same time.  Important Safety Information A possible side effect of Coumadin (Warfarin) is an increased risk of bleeding. You should call your healthcare provider right away if you experience any of the following:   Bleeding from an injury or your nose that does not stop.   Unusual colored urine (red or dark brown) or unusual colored stools (red or black).   Unusual bruising for unknown reasons.   A serious fall or if you hit your head (even if there is no bleeding).  Some foods or medicines interact with Coumadin (warfarin) and might alter your response to warfarin. To help avoid this:   Eat a balanced diet, maintaining a consistent amount of Vitamin K.   Notify your provider about major diet changes you plan to make.   Avoid alcohol or limit your intake to 1 drink for women and 2 drinks for men per day. (1 drink is 5 oz. wine, 12 oz. beer, or 1.5 oz. liquor.)  Make sure that ANY health care provider who prescribes medication for you knows that you are taking Coumadin (warfarin).  Also make sure the healthcare provider who is monitoring your Coumadin knows when you have started a new medication including herbals and non-prescription products.  Coumadin (Warfarin)  Major Drug Interactions  Increased Warfarin Effect Decreased Warfarin Effect  Alcohol (large quantities) Antibiotics (esp. Septra/Bactrim, Flagyl, Cipro) Amiodarone (Cordarone) Aspirin (ASA)  Cimetidine (Tagamet) Megestrol (Megace) NSAIDs (ibuprofen, naproxen, etc.) Piroxicam (Feldene) Propafenone (Rythmol SR) Propranolol (Inderal) Isoniazid (INH) Posaconazole (Noxafil) Barbiturates (Phenobarbital) Carbamazepine (Tegretol) Chlordiazepoxide (Librium) Cholestyramine (Questran) Griseofulvin Oral Contraceptives Rifampin Sucralfate (Carafate) Vitamin K   Coumadin (Warfarin) Major Herbal Interactions  Increased Warfarin Effect Decreased Warfarin Effect  Garlic Ginseng Ginkgo biloba  Coenzyme Q10 Green tea St. Johns wort    Coumadin (Warfarin) FOOD Interactions  Eat a consistent number of servings per week of foods HIGH in Vitamin K (1 serving =  cup)  Collards (cooked, or boiled & drained) Kale (cooked, or boiled & drained) Mustard greens (cooked, or boiled & drained) Parsley *serving size only =  cup Spinach (cooked, or boiled & drained) Swiss chard (cooked, or boiled & drained) Turnip greens (cooked, or boiled & drained)  Eat a consistent number of servings per week of foods MEDIUM-HIGH in Vitamin K (1 serving = 1 cup)  Asparagus (cooked, or boiled & drained) Broccoli (cooked, boiled & drained, or raw & chopped) Brussel sprouts (cooked, or boiled & drained) *serving size only =  cup Lettuce, raw (green leaf, endive, romaine) Spinach, raw Turnip greens, raw & chopped   These websites have more information on Coumadin (warfarin):  FailFactory.se; VeganReport.com.au;   Lisinopril tablets What is this medicine? LISINOPRIL (lyse IN oh pril) is an ACE inhibitor. This medicine is used to treat high blood pressure and heart failure. It is also used to protect the heart immediately after a heart attack. This medicine may be used for other purposes; ask your health care provider or pharmacist if you have questions. COMMON BRAND NAME(S): Prinivil, Zestril What should I tell my health care provider before I take this medicine? They need to know if you have any of these conditions: -diabetes -heart or blood vessel disease -immune system disease like lupus or scleroderma -kidney disease -low blood pressure -previous swelling of the tongue, face, or lips with difficulty breathing, difficulty swallowing, hoarseness, or tightening of the throat -an unusual or allergic reaction to lisinopril, other ACE inhibitors, insect venom, foods, dyes, or preservatives -pregnant or trying to get pregnant -breast-feeding How should I use this medicine? Take  this medicine by mouth with a glass of water. Follow the directions on your prescription label. You may take this medicine with or without food. Take your medicine at regular intervals. Do not stop taking this medicine except on the advice of your doctor or health care professional. Talk to your pediatrician regarding the use of this medicine in children. Special care may be needed. While this drug may be prescribed for children as young as 40 years of age for selected conditions, precautions do apply. Overdosage: If you think you have taken too much of this medicine contact a poison control center or emergency room at once. NOTE: This medicine is only for you. Do not share this medicine with others. What if I miss a dose? If you miss a dose, take it as soon as you can. If it is almost time for your next dose, take only that dose. Do not take double or extra doses. What may interact with this medicine? -diuretics -lithium -NSAIDs, medicines for pain and inflammation, like ibuprofen or naproxen -over-the-counter herbal supplements like hawthorn -potassium salts or potassium supplements -salt substitutes This list may not describe all possible interactions. Give your health care provider a list of all the medicines, herbs, non-prescription drugs, or dietary supplements you use. Also tell them if you smoke, drink alcohol, or use illegal drugs. Some items  may interact with your medicine. What should I watch for while using this medicine? Visit your doctor or health care professional for regular check ups. Check your blood pressure as directed. Ask your doctor what your blood pressure should be, and when you should contact him or her. Call your doctor or health care professional if you notice an irregular or fast heart beat. Women should inform their doctor if they wish to become pregnant or think they might be pregnant. There is a potential for serious side effects to an unborn child. Talk to your health  care professional or pharmacist for more information. Check with your doctor or health care professional if you get an attack of severe diarrhea, nausea and vomiting, or if you sweat a lot. The loss of too much body fluid can make it dangerous for you to take this medicine. You may get drowsy or dizzy. Do not drive, use machinery, or do anything that needs mental alertness until you know how this drug affects you. Do not stand or sit up quickly, especially if you are an older patient. This reduces the risk of dizzy or fainting spells. Alcohol can make you more drowsy and dizzy. Avoid alcoholic drinks. Avoid salt substitutes unless you are told otherwise by your doctor or health care professional. Do not treat yourself for coughs, colds, or pain while you are taking this medicine without asking your doctor or health care professional for advice. Some ingredients may increase your blood pressure. What side effects may I notice from receiving this medicine? Side effects that you should report to your doctor or health care professional as soon as possible: -abdominal pain with or without nausea or vomiting -allergic reactions like skin rash or hives, swelling of the hands, feet, face, lips, throat, or tongue -dark urine -difficulty breathing -dizzy, lightheaded or fainting spell -fever or sore throat -irregular heart beat, chest pain -pain or difficulty passing urine -redness, blistering, peeling or loosening of the skin, including inside the mouth -unusually weak -yellowing of the eyes or skin Side effects that usually do not require medical attention (report to your doctor or health care professional if they continue or are bothersome): -change in taste -cough -decreased sexual function or desire -headache -sun sensitivity -tiredness This list may not describe all possible side effects. Call your doctor for medical advice about side effects. You may report side effects to FDA at  1-800-FDA-1088. Where should I keep my medicine? Keep out of the reach of children. Store at room temperature between 15 and 30 degrees C (59 and 86 degrees F). Protect from moisture. Keep container tightly closed. Throw away any unused medicine after the expiration date. NOTE: This sheet is a summary. It may not cover all possible information. If you have questions about this medicine, talk to your doctor, pharmacist, or health care provider.  2015, Elsevier/Gold Standard. (2007-09-25 17:36:32)

## 2014-01-24 NOTE — Code Documentation (Signed)
Pulse check performed by Dr. Alyson Locket. CPR held at this time. Pulses present, pt responsive.

## 2014-01-24 NOTE — ED Notes (Signed)
[facility] EKG strips documenting defibs thrown away by secretary

## 2014-01-24 NOTE — Progress Notes (Signed)
Chaplain responded to Code Stemi in ED A10. Staff were administering CPR and able to stabalized pt.  Daughter and Sister were at bedside.  Pt was taken to Cath Lab.  Chaplain escorted family including pt husband, grandson, daughter and sister to Cath Lab waiting area.   Family were calm and well informed about what to expect Chaplain provided hospitality to family during their wait, as well as emotional support.  Chaplain is available for further support if desired.   01/24/14 0100  Clinical Encounter Type  Visited With Family;Health care provider  Visit Type Initial;ED;Code  Referral From Nurse  Spiritual Encounters  Spiritual Needs Emotional  Stress Factors  Patient Stress Factors None identified  Family Stress Factors Lack of knowledge;Major life changes   [place], Chaplain

## 2014-01-24 NOTE — ED Notes (Signed)
Pt in vfib, Dr. Jeneen Rinks present.

## 2014-01-24 NOTE — H&P (Signed)
Patient ID: Shirley [name] MRN: [id number], DOB/AGE: Jan 10, 1941   Admit date: 01/24/2014   Primary Physician: Merrilee Seashore, MD Primary Cardiologist: None  CC: STEMI  Problem List  Past Medical History  Diagnosis Date  . Hypertension   . Hemorrhoid   . GERD (gastroesophageal reflux disease)   . Adenomatous polyps   . DVT (deep venous thrombosis) 2003    lower extremity DVT with questionable recurrence in 2005. Treated with 1.5 year course of coumadin.  [name] History of TIAs     2004 - no documentation available. 09/2010 -  was not on aspirin or statin therapy prior to event.  . Endometrial thickening on ultra sound 07/2009    path showing Fragments of benign endocervical and squamous mucosa. No dysplasia or malignancy // Followed by Dr. [name]  . Coronary artery disease, non-occlusive 11/2003    LHC (11/2003) - LAD diffuse 30% stenosis of mid-vessel. RCA with 30% stenosis of proximal vessel - by Dr. Dannielle Burn. // 2D Echo (04/2006) -  LV EF 65%.  Mild LVH. No  wall motion abnormalities.  Mild diastolic dysfunction.   . Prediabetes DX: 09/2010    HgA1c 6.1  . Stroke   . Syncope 12/18/2013    Past Surgical History  Procedure Laterality Date  . Tubal ligation    . Knee surgery  1996    Left knee arthroscopy.  . Colonoscopy w/ polypectomy  11/01/2007    8 mm rectal adenoma, hemorrhoids  . Rotator cuff repair      right.     Allergies  Allergies  Allergen Reactions  . Tylenol [Acetaminophen] Other (See Comments)    Liver damage, and upset stomach per patient    HPI 22F with HTN, non-obstructive CAD in the past, recently diagnosed with recurrent DVT now on lifelong warfarin after failing rivaroxaban was in her USOH all day but awoke at midnight with crushing SSCP and worsening shortness of breath. Her family immediately called 911. Initial en route ECG was not concerning for STEMI but she had two episodes of VF in the ED and was successfully defibrillated. She  underwent CPR x 2 minutes and was given amiodarone 150mg  x 1.   Subsequent EKG revealed 19mm STE inferolaterally and cath lab was activated. Of note she is on lifelong coumadin and her most recent INR 2.6 from 2 days ago. She took her warfarin this evening.  Her coronary angiogram revealed 99% proximal RCA lesion.  Home Medications  Prior to Admission medications   Medication Sig Start Date End Date Taking? Authorizing Provider  acetaminophen (TYLENOL) 500 MG tablet Take 500 mg by mouth every 6 (six) hours as needed for mild pain.    Yes Historical Provider, MD  lisinopril-hydrochlorothiazide (PRINZIDE,ZESTORETIC) 10-12.5 MG per tablet Take 1 tablet by mouth every morning.    Yes Historical Provider, MD  metoprolol succinate (TOPROL-XL) 25 MG 24 hr tablet Take 1 tablet (25 mg total) by mouth daily. Take with or immediately following a meal. 12/22/13  Yes Ripudeep Krystal Eaton, MD  Multiple Vitamin (MULTIVITAMIN WITH MINERALS) TABS Take 1 tablet by mouth every morning.   Yes Historical Provider, MD  naproxen sodium (ANAPROX) 220 MG tablet Take 220 mg by mouth 2 (two) times daily as needed (for pain).   Yes Historical Provider, MD  omeprazole (PRILOSEC) 40 MG capsule Take 40 mg by mouth daily.   Yes Historical Provider, MD  promethazine-phenylephrine (PROMETHAZINE-PHENYLEPHRINE) 6.25-5 MG/5ML SYRP Take 5 mLs by mouth every 6 (six) hours as needed  for congestion.   Yes Historical Provider, MD  warfarin (COUMADIN) 5 MG tablet Take 5-7.5 mg by mouth See admin instructions. Take 1 tablet (5mg ) on Sunday and Wednesday.  Take 1.5 tablets (7.5mg ) all other days   Yes Historical Provider, MD    Family History  Family History  Problem Relation Age of Onset  . Thyroid cancer Sister   . Liver cancer Sister   . Diabetes Mother   . Hyperlipidemia Mother   . Heart disease Mother   . Dementia Mother   . Angina Father   . Heart disease Brother     x 2 in 68s yo  . Alzheimer's disease Maternal Grandmother      Social History  History   Social History  . Marital Status: Married    Spouse Name: N/A    Number of Children: 6  . Years of Education: College   Occupational History  . Control and instrumentation engineer   . [employer]   Social History Main Topics  . Smoking status: Never Smoker   . Smokeless tobacco: Never Used  . Alcohol Use: No  . Drug Use: No  . Sexual Activity: Not on file   Other Topics Concern  . Not on file   Social History Narrative   Full Code status.   Insurance: BCBS     Review of Systems - unable to assess  Physical Exam  Blood pressure 122/67, pulse 77, resp. rate 20, height 5\' 4"  (1.626 m), weight 264 lb 1 oz (119.778 kg), SpO2 100.00%.  General: Moderate distress Psych: Anxious Neuro: Alert and oriented X 3. Moves all extremities spontaneously. HEENT: Normal  Neck: Supple without bruits or JVD. Lungs:  Resp regular and unlabored, CTA. Heart: RRR no s3, s4, or murmurs. Abdomen: Soft, non-tender, non-distended, BS + x 4.  Extremities: No clubbing, cyanosis or edema. DP/PT/Radials 2+ and equal bilaterally.  Labs  Troponin (Point of Care Test)  Recent Labs  01/24/14 0100  TROPIPOC 0.01   No results found for this basename: CKTOTAL, CKMB, TROPONINI,  in the last 72 hours Lab Results  Component Value Date   WBC 21.1* 01/24/2014   HGB 12.6 01/24/2014   HCT 38.6 01/24/2014   MCV 82.0 01/24/2014   PLT 276 01/24/2014   No results found for this basename: NA, K, CL, CO2, BUN, CREATININE, CALCIUM, LABALBU, PROT, BILITOT, ALKPHOS, ALT, AST, GLUCOSE,  in the last 168 hours Lab Results  Component Value Date   CHOL 166 09/14/2010   HDL 41 09/14/2010   LDLCALC 107* 09/14/2010   TRIG 88 09/14/2010   Lab Results  Component Value Date   DDIMER 0.58* 12/18/2013     Radiology/Studies  Dg Chest Port 1 View  01/24/2014   CLINICAL DATA:  73 year old female with chest pain status post CPR. Initial encounter.  EXAM: PORTABLE  CHEST - 1 VIEW  COMPARISON:  Chest CTA 12/20/2013 and earlier.  FINDINGS: Portable AP semi upright view at 0117 hrs. Resuscitation pad. Mildly increased pulmonary vascularity, no overt edema. Stable cardiomegaly and mediastinal contours. Visualized tracheal air column is within normal limits. No pneumothorax or pleural effusion. No consolidation identified. Large body habitus.  IMPRESSION: Pulmonary vascular congestion without overt edema. No other acute cardiopulmonary abnormality.   Electronically Signed   By: Lars Pinks M.D.   On: 01/24/2014 01:27    ECG 72mm STE inferolaterally  Cath Hemodynamic Findings:  Central aortic pressure: 142/82  Left ventricular pressure: 134/19/27  Angiographic Findings:  Left  main: No obstructive disease.  Left Anterior Descending Artery: Large caliber vessel that courses to the apex. Moderate caliber diagonal branch. No obstructive disease.  Circumflex Artery: Large caliber vessel with large obtuse marginal branch. No obstructive disease.  Right Coronary Artery: Moderate caliber co-dominant vessel with sub-totally occluded proximal vessel.  Left Ventricular Angiogram: LVEF=50%  Impression:  1. Inferior STEMI secondary to sub-totally occluded RCA  2. Successful PTCA/DES x 1 proximal RCA  3. Preserved LV systolic function.    ASSESSMENT AND PLAN 69F with a history of chronic DVT on warfarin now with acute STEMI with 99% RCA lesion treated with primary PCI and BMS  #STEMI -ASA 81mg  indefinitely -Clopidogrel 600mg  load, 75mg  thereafter -Holding off on BB, ACEi for now given marginal hemodynamics -IVF as needed for presumed RV involvement -TTE in AM  #DVT: She will need lifelong anticoagulation for her multiple DVTs -Holding coumadin for now, can be restarted once hemostasis stably achieved   Signed, Raliegh Ip, MD MPH  01/24/2014, 3:46 AM  Pt admitted by Dr. Clayborne Artist. I performed the cardiac cath.   MCALHANY,CHRISTOPHER 01/30/2014 2:26 PM

## 2014-01-24 NOTE — Progress Notes (Signed)
ANTICOAGULATION CONSULT NOTE - Initial Consult  Pharmacy Consult for Coumadin Indication: H/o DVT  Allergies  Allergen Reactions  . Tylenol [Acetaminophen] Other (See Comments)    Liver damage, and upset stomach per patient    Patient Measurements: Height: 5\' 4"  (162.6 cm) Weight: 268 lb 15.4 oz (122 kg) IBW/kg (Calculated) : 54.7  Vital Signs: Temp: 97.5 F (36.4 C) (10/22 0700) Temp Source: Oral (10/22 0700) BP: 115/52 mmHg (10/22 0800) Pulse Rate: 60 (10/22 0800)  Labs:  Recent Labs  01/21/14 1506 01/24/14 0051 01/24/14 0555  HGB  --  12.6  --   HCT  --  38.6  --   PLT  --  276  --   LABPROT  --  26.1*  --   INR 2.60 2.36*  --   CREATININE  --  0.64  --   TROPONINI  --   --  14.36*    Estimated Creatinine Clearance: 80.7 ml/min (by C-G formula based on Cr of 0.64).   Medical History: Past Medical History  Diagnosis Date  . Hypertension   . Hemorrhoid   . GERD (gastroesophageal reflux disease)   . Adenomatous polyps   . DVT (deep venous thrombosis) 2003    lower extremity DVT with questionable recurrence in 2005. Treated with 1.5 year course of coumadin.  [name] History of TIAs     2004 - no documentation available. 09/2010 -  was not on aspirin or statin therapy prior to event.  . Endometrial thickening on ultra sound 07/2009    path showing Fragments of benign endocervical and squamous mucosa. No dysplasia or malignancy // Followed by Dr. [name]  . Coronary artery disease, non-occlusive 11/2003    LHC (11/2003) - LAD diffuse 30% stenosis of mid-vessel. RCA with 30% stenosis of proximal vessel - by Dr. Dannielle Burn. // 2D Echo (04/2006) -  LV EF 65%.  Mild LVH. No  wall motion abnormalities.  Mild diastolic dysfunction.   . Prediabetes DX: 09/2010    HgA1c 6.1  . Stroke   . Syncope 12/18/2013    Medications:  Scheduled:  . amiodarone      . aspirin  81 mg Oral Daily  . atorvastatin  80 mg Oral q1800  . clopidogrel  75 mg Oral Q breakfast  . metoprolol  succinate  25 mg Oral Daily  . pantoprazole  40 mg Oral Daily  . warfarin  7.5 mg Oral ONCE-1800  . Warfarin - Pharmacist Dosing Inpatient   Does not apply q1800   Assessment: 73 yo F admitted on 01/24/2014 with STEMI. Patient is chronically on Coumadin for history of extensive DVT's to be continued while inpatient. INR is therapeutic at 2.36, CBC wnl with no reported s/s bleeding.  Home dose: 7.5 mg PO daily except 5 mg PO Wed/Sun   Goal of Therapy:  INR 2-3 Monitor platelets by anticoagulation protocol: Yes   Plan:  - Coumadin 7.5 mg PO x 1 tonight (c/w home dose) - Daily INR, CBC - Monitor for s/s bleeding  Harolyn Rutherford, PharmD Clinical Pharmacist - Resident Pager: [phone] Pharmacy: [phone] 01/24/2014 9:05 AM

## 2014-01-24 NOTE — Code Documentation (Signed)
Family at beside throughout entire code. Family given emotional support.

## 2014-01-24 NOTE — ED Provider Notes (Signed)
CSN: [id number]     Arrival date & time 01/24/14  0026 History   First MD Initiated Contact with Patient 01/24/14 0101     Chief Complaint  Patient presents with  . Chest Pain      HPI  Patient presents via EMS with complaint of chest pain. Interpretation is via her daughter who accompanies her. She is awake and from sleep with chest pain tonight approximately 45 minutes before her arrival here.  Was given nitroglycerin by paramedics in route. Had a significant drop in her blood pressure to 80. Responded slowly. Remained awake and alert without syncope. Arrives here describing anterior chest pain for less than one hours duration.  Recently hospitalized with DVT. No pulmonary embolus. Is on Coumadin. Her last INR was 2 days ago. No history of coronary disease. Does have a history of hypertension.  Past Medical History  Diagnosis Date  . Hypertension   . Hemorrhoid   . GERD (gastroesophageal reflux disease)   . Adenomatous polyps   . DVT (deep venous thrombosis) 2003    lower extremity DVT with questionable recurrence in 2005. Treated with 1.5 year course of coumadin.  [name] History of TIAs     2004 - no documentation available. 09/2010 -  was not on aspirin or statin therapy prior to event.  . Endometrial thickening on ultra sound 07/2009    path showing Fragments of benign endocervical and squamous mucosa. No dysplasia or malignancy // Followed by Dr. [name]  . Coronary artery disease, non-occlusive 11/2003    LHC (11/2003) - LAD diffuse 30% stenosis of mid-vessel. RCA with 30% stenosis of proximal vessel - by Dr. Dannielle Burn. // 2D Echo (04/2006) -  LV EF 65%.  Mild LVH. No  wall motion abnormalities.  Mild diastolic dysfunction.   . Prediabetes DX: 09/2010    HgA1c 6.1  . Stroke   . Syncope 12/18/2013   Past Surgical History  Procedure Laterality Date  . Tubal ligation    . Knee surgery  1996    Left knee arthroscopy.  . Colonoscopy w/ polypectomy  11/01/2007    8 mm rectal  adenoma, hemorrhoids  . Rotator cuff repair      right.   Family History  Problem Relation Age of Onset  . Thyroid cancer Sister   . Liver cancer Sister   . Diabetes Mother   . Hyperlipidemia Mother   . Heart disease Mother   . Dementia Mother   . Angina Father   . Heart disease Brother     x 2 in 41s yo  . Alzheimer's disease Maternal Grandmother    History  Substance Use Topics  . Smoking status: Never Smoker   . Smokeless tobacco: Never Used  . Alcohol Use: No   OB History   Grav Para Term Preterm Abortions TAB SAB Ect Mult Living                 Review of Systems  Constitutional: Negative for fever, chills, diaphoresis, appetite change and fatigue.  HENT: Negative for mouth sores, sore throat and trouble swallowing.   Eyes: Negative for visual disturbance.  Respiratory: Negative for cough, chest tightness, shortness of breath and wheezing.   Cardiovascular: Positive for chest pain.  Gastrointestinal: Positive for nausea. Negative for vomiting, abdominal pain, diarrhea and abdominal distention.  Endocrine: Negative for polydipsia, polyphagia and polyuria.  Genitourinary: Negative for dysuria, frequency and hematuria.  Musculoskeletal: Negative for gait problem.  Skin: Negative for color change, pallor and  rash.  Neurological: Negative for dizziness, syncope, light-headedness and headaches.  Hematological: Does not bruise/bleed easily.  Psychiatric/Behavioral: Negative for behavioral problems and confusion.      Allergies  Tylenol  Home Medications   Prior to Admission medications   Medication Sig Start Date End Date Taking? Authorizing Provider  acetaminophen (TYLENOL) 500 MG tablet Take 500 mg by mouth every 6 (six) hours as needed for mild pain.    Yes Historical Provider, MD  lisinopril-hydrochlorothiazide (PRINZIDE,ZESTORETIC) 10-12.5 MG per tablet Take 1 tablet by mouth every morning.    Yes Historical Provider, MD  metoprolol succinate (TOPROL-XL) 25 MG  24 hr tablet Take 1 tablet (25 mg total) by mouth daily. Take with or immediately following a meal. 12/22/13  Yes Ripudeep Krystal Eaton, MD  Multiple Vitamin (MULTIVITAMIN WITH MINERALS) TABS Take 1 tablet by mouth every morning.   Yes Historical Provider, MD  naproxen sodium (ANAPROX) 220 MG tablet Take 220 mg by mouth 2 (two) times daily as needed (for pain).   Yes Historical Provider, MD  omeprazole (PRILOSEC) 40 MG capsule Take 40 mg by mouth daily.   Yes Historical Provider, MD  promethazine-phenylephrine (PROMETHAZINE-PHENYLEPHRINE) 6.25-5 MG/5ML SYRP Take 5 mLs by mouth every 6 (six) hours as needed for congestion.   Yes Historical Provider, MD  warfarin (COUMADIN) 5 MG tablet Take 5-7.5 mg by mouth See admin instructions. Take 1 tablet (5mg ) on Sunday and Wednesday.  Take 1.5 tablets (7.5mg ) all other days   Yes Historical Provider, MD   BP 122/67  Pulse 77  Resp 20  Ht 5\' 4"  (1.626 m)  Wt 264 lb 1 oz (119.778 kg)  BMI 45.30 kg/m2  SpO2 100% Physical Exam  Constitutional: She appears well-developed and well-nourished. No distress.  I was called to the room by the nurse in the room. Stating "she is having a seizure". I arrived to find the patient with her arms flexed turned her right side fixed gaze and not verbally responsive. Initially pulseless and apneic, CPR and bagged ventilations were begun immediately. She was placed on the monitor.  HENT:  Head: Normocephalic.  Eyes: Conjunctivae are normal. Pupils are equal, round, and reactive to light. No scleral icterus.  Neck: Normal range of motion. Neck supple. No thyromegaly present.  Cardiovascular: Normal rate and regular rhythm.  Exam reveals no gallop and no friction rub.   No murmur heard. Initially pulseless.  Pulmonary/Chest: Effort normal and breath sounds normal. No respiratory distress. She has no wheezes. She has no rales.  Clear bilateral breath sounds after resuscitation.  Abdominal: Soft. Bowel sounds are normal. She exhibits  no distension. There is no tenderness. There is no rebound.  Musculoskeletal: Normal range of motion.  Neurological:  Unresponsive. Returns to normal mental status after resuscitation.  Skin: Skin is warm and dry. No rash noted.    ED Course  CRITICAL CARE Performed by: Janah Mcculloh, [place] by: Tanna Furry Total critical care time: 30 minutes Critical care start time: 01/24/2014 12:34 AM Critical care end time: 01/24/2014 1:01 AM Critical care time was exclusive of separately billable procedures and treating other patients. Critical care was necessary to treat or prevent imminent or life-threatening deterioration of the following conditions: Cardiac arrest. Critical care was time spent personally by me on the following activities: blood draw for specimens, development of treatment plan with patient or surrogate, interpretation of cardiac output measurements, evaluation of patient's response to treatment, examination of patient, obtaining history from patient or surrogate, ordering and performing treatments and  interventions, ordering and review of laboratory studies, ordering and review of radiographic studies, pulse oximetry and re-evaluation of patient's condition.  defibrillation Date/Time: 01/24/2014 12:34 AM Performed by: Tanna Furry Authorized by: Tanna Furry Consent: Verbal consent not obtained. written consent not obtained. The procedure was performed in an emergent situation. Patient sedated: no Cardioversion basis: emergent Pre-procedure rhythm: ventricular fibrillation Patient position: patient was placed in a supine position Chest area: chest area exposed Electrodes: pads Electrodes placed: anterior-posterior Number of attempts: 2 Attempt 1 mode: asynchronous Attempt 1 waveform: biphasic Attempt 1 shock (in Joules): 200 Attempt 1 outcome: conversion to pulseless electrical activity Attempt 2 mode: asynchronous Attempt 2 waveform: biphasic Attempt 2 shock (in  Joules): 200 Attempt 2 outcome: conversion to normal sinus rhythm Post-procedure rhythm: normal sinus rhythm Complications: no complications Patient tolerance: Patient tolerated the procedure well with no immediate complications.   (including critical care time) Cardiopulmonary Resuscitation (CPR) Procedure Note Directed/Performed by: Lolita Patella I personally directed ancillary staff and/or performed CPR in an effort to regain return of spontaneous circulation and to maintain cardiac, neuro and systemic perfusion.    Labs Review Labs Reviewed  CBC - Abnormal; Notable for the following:    WBC 21.1 (*)    All other components within normal limits  BASIC METABOLIC PANEL - Abnormal; Notable for the following:    Sodium 134 (*)    Glucose, Bld 146 (*)    GFR calc non Af Amer 86 (*)    Anion gap 17 (*)    All other components within normal limits  PROTIME-INR - Abnormal; Notable for the following:    Prothrombin Time 26.1 (*)    INR 2.36 (*)    All other components within normal limits  Randolm Idol, ED    Imaging Review Dg Chest Port 1 View  01/24/2014   CLINICAL DATA:  73 year old female with chest pain status post CPR. Initial encounter.  EXAM: PORTABLE CHEST - 1 VIEW  COMPARISON:  Chest CTA 12/20/2013 and earlier.  FINDINGS: Portable AP semi upright view at 0117 hrs. Resuscitation pad. Mildly increased pulmonary vascularity, no overt edema. Stable cardiomegaly and mediastinal contours. Visualized tracheal air column is within normal limits. No pneumothorax or pleural effusion. No consolidation identified. Large body habitus.  IMPRESSION: Pulmonary vascular congestion without overt edema. No other acute cardiopulmonary abnormality.   Electronically Signed   By: Lars Pinks M.D.   On: 01/24/2014 01:27     EKG Interpretation None      EKG shows ST elevations inferiorly and laterally  MDM   Final diagnoses:  Chest pain     Upon entering the room the patient had  her hands contracted across her chest, had rolled her right side, and was unresponsive. Pulseless. Immediately placed on the monitor. Bag valve mask inhalation started. Monitor shows ventricular fibrillation. A call from the crash cart. She is placed on the pads. Defibrillated at 200 J. Response to an idioventricular bradycardic rate without pulses. CPR initiated. Continued bag-valve-mask relations. Again has ventricular fibrillation. Defibrillated 200 J. Today a univentricular rhythm with pulse, then sinus rhythm.  Given amiodarone 150 IV push. Started on infusion 2 mg per minute. Code STEMI initiated. EKG shows ST elevations laterally.   Patient almost immediately awake, spontaneous respirations noted. Placed on mask O2. Conversant with her daughter. Given heparin bolus. Given aspirin. Given Plavix 300 mg by mouth at  request of cardiology. Plan is emergently to the cath lab   Tanna Furry, MD 01/24/14 [phone]

## 2014-01-24 NOTE — ED Notes (Signed)
Dr. Jeneen Rinks at the bedside, code cart present.

## 2014-01-24 NOTE — ED Notes (Signed)
Cardiology at bedside.

## 2014-01-24 NOTE — Progress Notes (Signed)
eLink Physician-Brief Progress Note Patient Name: Shirley [name] DOB: 1940/04/21 MRN: [id number]   Date of Service  01/24/2014  HPI/Events of Note  Code STEMI, to cath lab, inferior wall MI.  Chart reviewed.  eICU Interventions  No change in plans.        [name] 01/24/2014, 3:23 AM

## 2014-01-24 NOTE — Progress Notes (Signed)
  Echocardiogram 2D Echocardiogram has been performed.  Shirley [name] 01/24/2014, 4:43 PM

## 2014-01-24 NOTE — ED Notes (Signed)
Pt transported to cath lab with RR nurse.

## 2014-01-24 NOTE — ED Notes (Addendum)
Pt arrives via EMS with chest pain, sudden onset, awakened from sleep. 10/10. Tachypnea upon EMS arrival. L sided CP radiating into left arm, feels that same as previous MI. Unable to take ASA. Lovenox shots. 1 SL nitro, pressure went from 170/100 to 108/77, so no more nitro was given. Pt anxious with CP d/t hx. EMS EKG shows 1st degree AV block. 98% RA, 2L O2 doer comfort. Nauseated. 20G IV placed in R AC.

## 2014-01-24 NOTE — CV Procedure (Addendum)
     Cardiac Catheterization Operative Report  [name] [id number] 10/22/20152:26 AM Merrilee Seashore, MD  Procedure Performed:  1. Left Heart Catheterization 2. Selective Coronary Angiography 3. Left ventricular angiogram 4. PTCA/bare metal stent x 1 proximal RCA 5. Angioseal Right femoral artery  Operator: Lauree Chandler, MD  Indication: 73 yo female with history of DVT on chronic coumadin therapy admitted with inferior STEMI. INR 2.4.                                       Procedure Details: The risks, benefits, complications, treatment options, and expected outcomes were discussed with the patient. Emergency consent obtained. The patient was brought to the cath lab from the ED. I attempted access in the right radial artery but could not pass my wire. The right groin was prepped and draped in the usual manner. Using the modified Seldinger access technique, a 6 French sheath was placed in the right femoral artery. Standard diagnostic catheters were used to perform selective coronary angiography. She was found to have a sub-totally occluded RCA. I elected to proceed to PCI.   PCI Note: The RCA was engaged with a JR4 guiding catheter. She was given Plavix 600 mg po x 1. Angiomax weight based bolus followed by a drip. ACT was over 200. Whisper wire down RCA. The vessel was completely occluded before the wire was passed. 2.0 x 12 mm balloon for pre-dilation of lesion x 2. 3.0 x 16 mm Rebel bare metal stent x 1 in the proximal RCA. The stent was post-dilated with a 3.0 x 12 mm  balloon x 1. The stenosis was taken from 100% down to 0%. There was excellent flow down the RCA. A pigtail catheter was used to perform a left ventricular angiogram. Angioseal placed right femoral artery.   There were no immediate complications. The patient was taken to the recovery area in stable condition.   Hemodynamic Findings: Central aortic pressure: 142/82 Left ventricular pressure:  134/19/27  Angiographic Findings:  Left main: No obstructive disease.   Left Anterior Descending Artery: Large caliber vessel that courses to the apex. Moderate caliber diagonal branch. No obstructive disease.   Circumflex Artery: Large caliber vessel with large obtuse marginal branch. No obstructive disease.   Right Coronary Artery: Moderate caliber co-dominant vessel with sub-totally occluded proximal vessel.   Left Ventricular Angiogram: LVEF=50%  Impression: 1. Inferior STEMI secondary to sub-totally occluded RCA 2. Successful PTCA/bare metal stent x 1 proximal RCA 3. Preserved LV systolic function.   Recommendations: Given her need for ongoing coumadin therapy, I elected to use Plavix and ASA tonight. Will likely resume coumadin tomorrow if no bleeding from groin site. Would treat with ASA, Plavix and coumadin for one month then stop ASA after one month. Will start beta blocker, statin. Admit to CCU. Echo in am.        Complications:  None. The patient tolerated the procedure well.

## 2014-01-24 NOTE — Progress Notes (Signed)
Utilization Review Completed.Shirley [name] T10/22/2015  

## 2014-01-24 NOTE — ED Notes (Signed)
Pt seizing at this time, Dr. Jeneen Rinks at bedside

## 2014-01-24 NOTE — ED Notes (Signed)
CPR continued immediately after shock, pt is now alert, however remains in VFIB

## 2014-01-24 NOTE — Progress Notes (Signed)
PROGRESS NOTE  Subjective:   [name] is a 73 year old female who was admitted with inferior wall STEMI. She had successful PCI of her right coronary artery using a 3.0 x 16 mm rebel bare-metal stent. The LAD and left circumflex artery were free of obstructive disease. The left ventricular systolic function was at the lower limits of normal with an LVEF of 50%.  She's on warfarin therapy for treatment of a DVT.  Objective:    Vital Signs:   Temp:  [97.5 F (36.4 C)-98.3 F (36.8 C)] 97.5 F (36.4 C) (10/22 0700) Pulse Rate:  [56-137] 60 (10/22 0800) Resp:  [4-35] 16 (10/22 0800) BP: (92-153)/(52-119) 115/52 mmHg (10/22 0800) SpO2:  [98 %-100 %] 100 % (10/22 0800) Weight:  [264 lb 1 oz (119.778 kg)-268 lb 15.4 oz (122 kg)] 268 lb 15.4 oz (122 kg) (10/22 0300)  Last BM Date: 01/24/14   24-hour weight change: Weight change:   Weight trends: Filed Weights   01/24/14 0034 01/24/14 0300  Weight: 264 lb 1 oz (119.778 kg) 268 lb 15.4 oz (122 kg)    Intake/Output:  10/21 0701 - 10/22 0700 In: 209.8 [I.V.:209.8] Out: -  Total I/O In: 50 [I.V.:50] Out: 1200 [Urine:1200]   Physical Exam: BP 115/52  Pulse 60  Temp(Src) 97.5 F (36.4 C) (Oral)  Resp 16  Ht 5\' 4"  (1.626 m)  Wt 268 lb 15.4 oz (122 kg)  BMI 46.14 kg/m2  SpO2 100%  Wt Readings from Last 3 Encounters:  01/24/14 268 lb 15.4 oz (122 kg)  01/24/14 268 lb 15.4 oz (122 kg)  01/03/14 270 lb 6.4 oz (122.653 kg)    General: Vital signs reviewed and noted.  Obese female  Head: Normocephalic, atraumatic.  Eyes: conjunctivae/corneas clear.  EOM's intact.   Throat: normal  Neck:  normal   Lungs:    clear   Heart:  RR  Abdomen:  Soft, non-tender, non-distended,   obese  Extremities: Right groin is OK. No hematoma  Neurologic: A&O X3, CN II - XII are grossly intact.   Psych: Normal     Labs: BMET:  Recent Labs  01/24/14 0051  NA 134*  K 4.5  CL 98  CO2 19  GLUCOSE 146*  BUN 16  CREATININE  0.64  CALCIUM 8.9    Liver function tests: No results found for this basename: AST, ALT, ALKPHOS, BILITOT, PROT, ALBUMIN,  in the last 72 hours No results found for this basename: LIPASE, AMYLASE,  in the last 72 hours  CBC:  Recent Labs  01/24/14 0051  WBC 21.1*  HGB 12.6  HCT 38.6  MCV 82.0  PLT 276    Cardiac Enzymes:  Recent Labs  01/24/14 0555  TROPONINI 14.36*    Coagulation Studies:  Recent Labs  01/21/14 1506 01/24/14 0051  LABPROT  --  26.1*  INR 2.60 2.36*    Other: No components found with this basename: POCBNP,  No results found for this basename: DDIMER,  in the last 72 hours No results found for this basename: HGBA1C,  in the last 72 hours No results found for this basename: CHOL, HDL, LDLCALC, TRIG, CHOLHDL,  in the last 72 hours No results found for this basename: TSH, T4TOTAL, FREET3, T3FREE, THYROIDAB,  in the last 72 hours No results found for this basename: VITAMINB12, FOLATE, FERRITIN, TIBC, IRON, RETICCTPCT,  in the last 72 hours   Other results:  EKG :    Medications:    Infusions: .  sodium chloride 50 mL/hr at 01/24/14 0435    Scheduled Medications: . amiodarone      . aspirin  81 mg Oral Daily  . [START ON 01/25/2014] aspirin EC  81 mg Oral Daily  . atorvastatin  80 mg Oral q1800  . clopidogrel  75 mg Oral Q breakfast  . metoprolol succinate  25 mg Oral Daily  . pantoprazole  40 mg Oral Daily    Assessment/ Plan:   Active Problems:   STEMI (ST elevation myocardial infarction)   ST elevation myocardial infarction (STEMI) of inferior wall  1. Coronary artery disease-STEMI. The patient presented with an inferior wall 70. She had placement of a bare-metal stent. The plan is for Coumadin, Plavix, aspirin for one month. At that point we will stop the aspirin. Just normal left ventricle systolic function.  We'll transfer her to stepdown . We'll advance her diet. She'll ambulate with cardiac rehabilitation.  2. History of  DVT: Continue Coumadin. Goal INR is between 2 and 3.  Disposition: transfer to step down  Length of Stay: 0  Thayer Headings, Brooke Bonito., MD, [facility] 01/24/2014, 8:44 AM Office [phone] Pager [phone]

## 2014-01-25 DIAGNOSIS — D689 Coagulation defect, unspecified: Secondary | ICD-10-CM

## 2014-01-25 DIAGNOSIS — I2111 ST elevation (STEMI) myocardial infarction involving right coronary artery: Secondary | ICD-10-CM

## 2014-01-25 LAB — URINALYSIS, ROUTINE W REFLEX MICROSCOPIC
BILIRUBIN URINE: NEGATIVE
GLUCOSE, UA: NEGATIVE mg/dL
KETONES UR: NEGATIVE mg/dL
NITRITE: NEGATIVE
PH: 6 (ref 5.0–8.0)
Protein, ur: NEGATIVE mg/dL
Specific Gravity, Urine: 1.009 (ref 1.005–1.030)
Urobilinogen, UA: 1 mg/dL (ref 0.0–1.0)

## 2014-01-25 LAB — LIPID PANEL
Cholesterol: 147 mg/dL (ref 0–200)
HDL: 29 mg/dL — ABNORMAL LOW (ref >39)
LDL Cholesterol: 97 mg/dL (ref 0–99)
TRIGLYCERIDES: 105 mg/dL (ref <150)
Total CHOL/HDL Ratio: 5.1 RATIO
VLDL: 21 mg/dL (ref 0–40)

## 2014-01-25 LAB — PROTIME-INR
INR: 2.47 — ABNORMAL HIGH (ref 0.00–1.49)
Prothrombin Time: 26.9 seconds — ABNORMAL HIGH (ref 11.6–15.2)

## 2014-01-25 LAB — URINE MICROSCOPIC-ADD ON

## 2014-01-25 MED ORDER — WARFARIN SODIUM 7.5 MG PO TABS
7.5000 mg | ORAL_TABLET | Freq: Once | ORAL | Status: AC
  Administered 2014-01-25: 7.5 mg via ORAL
  Filled 2014-01-25 (×2): qty 1

## 2014-01-25 MED FILL — Sodium Chloride IV Soln 0.9%: INTRAVENOUS | Qty: 50 | Status: AC

## 2014-01-25 MED FILL — Medication: Qty: 1 | Status: AC

## 2014-01-25 NOTE — Progress Notes (Signed)
ANTICOAGULATION CONSULT NOTE - Initial Consult  Pharmacy Consult for Coumadin Indication: H/o DVT  Allergies  Allergen Reactions  . Tylenol [Acetaminophen] Other (See Comments)    Liver damage, and upset stomach per patient    Patient Measurements: Height: 5\' 4"  (162.6 cm) Weight: 268 lb 15.4 oz (122 kg) IBW/kg (Calculated) : 54.7  Vital Signs: Temp: 99 F (37.2 C) (10/23 0753) Temp Source: Oral (10/23 0753) BP: 115/53 mmHg (10/23 0753) Pulse Rate: 70 (10/23 0928)  Labs:  Recent Labs  01/24/14 0051 01/24/14 0555 01/24/14 1029 01/24/14 1445 01/25/14 0300  HGB 12.6  --  11.7*  --   --   HCT 38.6  --  35.8*  --   --   PLT 276  --  248  --   --   LABPROT 26.1*  --   --   --  26.9*  INR 2.36*  --   --   --  2.47*  CREATININE 0.64  --  0.59  --   --   TROPONINI  --  14.36* 15.17* 15.49*  --     Estimated Creatinine Clearance: 80.7 ml/min (by C-G formula based on Cr of 0.59).   Medical History: Past Medical History  Diagnosis Date  . Hypertension   . Hemorrhoid   . GERD (gastroesophageal reflux disease)   . Adenomatous polyps   . DVT (deep venous thrombosis) 2003    lower extremity DVT with questionable recurrence in 2005. Treated with 1.5 year course of coumadin.  [name] History of TIAs     2004 - no documentation available. 09/2010 -  was not on aspirin or statin therapy prior to event.  . Endometrial thickening on ultra sound 07/2009    path showing Fragments of benign endocervical and squamous mucosa. No dysplasia or malignancy // Followed by Dr. [name]  . Coronary artery disease, non-occlusive 11/2003    LHC (11/2003) - LAD diffuse 30% stenosis of mid-vessel. RCA with 30% stenosis of proximal vessel - by Dr. Dannielle Burn. // 2D Echo (04/2006) -  LV EF 65%.  Mild LVH. No  wall motion abnormalities.  Mild diastolic dysfunction.   . Prediabetes DX: 09/2010    HgA1c 6.1  . Stroke   . Syncope 12/18/2013    Medications:  Scheduled:  . aspirin  81 mg Oral Daily  .  atorvastatin  80 mg Oral q1800  . clopidogrel  75 mg Oral Q breakfast  . metoprolol succinate  25 mg Oral Daily  . pantoprazole  40 mg Oral Daily  . Warfarin - Pharmacist Dosing Inpatient   Does not apply q1800   Assessment: 73 yo F admitted on 01/24/2014 with STEMI. Patient is chronically on Coumadin for history of extensive DVT's to be continued while inpatient. INR remains therapeutic at 2.47, H/H has trended down slightly, plt wnl and stable with no reported s/s bleeding.  Home dose: 7.5 mg PO daily except 5 mg PO Wed/Sun   Goal of Therapy:  INR 2-3 Monitor platelets by anticoagulation protocol: Yes   Plan:  - Coumadin 7.5 mg PO x 1 tonight (c/w home dose) - Daily INR, CBC - Monitor for s/s bleeding  Harolyn Rutherford, PharmD Clinical Pharmacist - Resident Pager: [phone] Pharmacy: [phone] 01/25/2014 10:48 AM

## 2014-01-25 NOTE — Progress Notes (Signed)
CARDIAC REHAB PHASE I   PRE:  Rate/Rhythm: 74 SR    BP: sitting 132/49    SaO2: 98 RA  MODE:  Ambulation: 130 ft   POST:  Rate/Rhythm: 87 SR PVCs    BP: sitting 117/31     SaO2: 98 RA  Pt SOB with exertion. Sts she is not like this normally. Ed began with daughter present. Good reception. She has lost 47 lbs since April by watching her diet. Has f/u with PCP in November. Interested in [place] and will send referral to [place]. Will f/u tomorrow to see how she tolerates walking and to give ex gl and NTG instructions. [place], [place], ACSM 01/25/2014 11:57 AM

## 2014-01-25 NOTE — Progress Notes (Signed)
PROGRESS NOTE  Subjective:   [name] is a 73 year old female who was admitted with inferior wall STEMI. She had successful PCI of her right coronary artery using a 3.0 x 16 mm rebel bare-metal stent. The LAD and left circumflex artery were free of obstructive disease. The left ventricular systolic function was at the lower limits of normal with an LVEF of 50%.  She's on warfarin therapy for treatment of a DVT.  She is feeling better.  Complains of some chest wall pain.  Also has a strong odor to her urine.  Found out today that she has been followed by Dr. Wynonia [name] in the past.  Get an echocardiogram which revealed normal left ventricular systolic function.   Objective:    Vital Signs:   Temp:  [98 F (36.7 C)-99 F (37.2 C)] 99 F (37.2 C) (10/23 0753) Pulse Rate:  [54-70] 70 (10/23 0928) Resp:  [16-18] 18 (10/23 0753) BP: (98-127)/(34-66) 115/53 mmHg (10/23 0753) SpO2:  [89 %-100 %] 98 % (10/23 0800)  Last BM Date: 01/24/14   24-hour weight change: Weight change:   Weight trends: Filed Weights   01/24/14 0034 01/24/14 0300  Weight: 264 lb 1 oz (119.778 kg) 268 lb 15.4 oz (122 kg)    Intake/Output:  10/22 0701 - 10/23 0700 In: 890 [P.O.:840; I.V.:50] Out: 2900 [Urine:2900] Total I/O In: 240 [P.O.:240] Out: 900 [Urine:900]   Physical Exam: BP 115/53  Pulse 70  Temp(Src) 99 F (37.2 C) (Oral)  Resp 18  Ht 5\' 4"  (1.626 m)  Wt 268 lb 15.4 oz (122 kg)  BMI 46.14 kg/m2  SpO2 98%  Wt Readings from Last 3 Encounters:  01/24/14 268 lb 15.4 oz (122 kg)  01/24/14 268 lb 15.4 oz (122 kg)  01/03/14 270 lb 6.4 oz (122.653 kg)    General: Vital signs reviewed and noted.  Obese female  Head: Normocephalic, atraumatic.  Eyes: conjunctivae/corneas clear.  EOM's intact.   Throat: normal  Neck:  normal   Lungs:    clear   Heart:  RR  Abdomen:  Soft, non-tender, non-distended,   obese  Extremities: Right groin is OK. No hematoma  Neurologic: A&O X3, CN II  - XII are grossly intact.   Psych: Normal     Labs: BMET:  Recent Labs  01/24/14 0051 01/24/14 1029  NA 134* 135*  K 4.5 4.2  CL 98 102  CO2 19 23  GLUCOSE 146* 124*  BUN 16 12  CREATININE 0.64 0.59  CALCIUM 8.9 8.1*    Liver function tests: No results found for this basename: AST, ALT, ALKPHOS, BILITOT, PROT, ALBUMIN,  in the last 72 hours No results found for this basename: LIPASE, AMYLASE,  in the last 72 hours  CBC:  Recent Labs  01/24/14 0051 01/24/14 1029  WBC 21.1* 14.4*  HGB 12.6 11.7*  HCT 38.6 35.8*  MCV 82.0 84.0  PLT 276 248    Cardiac Enzymes:  Recent Labs  01/24/14 0555 01/24/14 1029 01/24/14 1445  TROPONINI 14.36* 15.17* 15.49*    Coagulation Studies:  Recent Labs  01/24/14 0051 01/25/14 0300  LABPROT 26.1* 26.9*  INR 2.36* 2.47*    Other: No components found with this basename: POCBNP,  No results found for this basename: DDIMER,  in the last 72 hours No results found for this basename: HGBA1C,  in the last 72 hours  Recent Labs  01/25/14 0700  CHOL 147  HDL 29*  LDLCALC 97  TRIG 105  CHOLHDL 5.1   No results found for this basename: TSH, T4TOTAL, FREET3, T3FREE, THYROIDAB,  in the last 72 hours No results found for this basename: VITAMINB12, FOLATE, FERRITIN, TIBC, IRON, RETICCTPCT,  in the last 72 hours   Other results:  EKG :    Medications:    Infusions:    Scheduled Medications: . aspirin  81 mg Oral Daily  . atorvastatin  80 mg Oral q1800  . clopidogrel  75 mg Oral Q breakfast  . metoprolol succinate  25 mg Oral Daily  . pantoprazole  40 mg Oral Daily  . Warfarin - Pharmacist Dosing Inpatient   Does not apply [id number]    Assessment/ Plan:   Active Problems:   STEMI (ST elevation myocardial infarction)   ST elevation myocardial infarction (STEMI) of inferior wall  1. Coronary artery disease-STEMI. The patient presented with an inferior wall 70. She had placement of a bare-metal stent. The plan is for  Coumadin, Plavix, aspirin for one month. At that point we will stop the aspirin. Just normal left ventricle systolic function.  We'll transfer her to telemetry We'll advance her diet. She'll ambulate with cardiac rehabilitation. I called Dr. Thurman [name] office and informed him that the patient was admitted to the hospital.  2. History of DVT: Continue Coumadin. Goal INR is between 2 and 3.  3. possible urinary tract infection: The patient complains of having a strong odor to her urine. Check urinalysis and urine culture.  Disposition: transfer to telemetry. Length of Stay: 1  Thayer Headings, Shirley [name]., MD, [facility] 01/25/2014, 10:45 AM Office [phone] Pager [phone]

## 2014-01-25 NOTE — Progress Notes (Signed)
Order for transfer acknowledged, room [id number] available. Pt  and daughter informed and verbalized understanding. Report given to 3W RN. Belongings packed, pt transferred via w/c with belongings. Will cont to monitor closely.

## 2014-01-26 LAB — CBC
HEMATOCRIT: 35.9 % — AB (ref 36.0–46.0)
Hemoglobin: 11.9 g/dL — ABNORMAL LOW (ref 12.0–15.0)
MCH: 27.5 pg (ref 26.0–34.0)
MCHC: 33.1 g/dL (ref 30.0–36.0)
MCV: 83.1 fL (ref 78.0–100.0)
Platelets: 225 10*3/uL (ref 150–400)
RBC: 4.32 MIL/uL (ref 3.87–5.11)
RDW: 14.5 % (ref 11.5–15.5)
WBC: 14.7 10*3/uL — AB (ref 4.0–10.5)

## 2014-01-26 LAB — PROTIME-INR
INR: 2.32 — AB (ref 0.00–1.49)
Prothrombin Time: 25.7 seconds — ABNORMAL HIGH (ref 11.6–15.2)

## 2014-01-26 MED ORDER — WARFARIN SODIUM 7.5 MG PO TABS
7.5000 mg | ORAL_TABLET | Freq: Once | ORAL | Status: AC
  Administered 2014-01-26: 7.5 mg via ORAL
  Filled 2014-01-26: qty 1

## 2014-01-26 NOTE — Progress Notes (Signed)
CARDIAC REHAB PHASE I   PRE:  Rate/Rhythm: 65 SR  BP:  Supine: 125/69 Sitting:   Standing:    SaO2: 98% RA  MODE:  Ambulation: 450 ft   POST:  Rate/Rhythm: 68  BP:  Supine:   Sitting: 146/63  Standing:    SaO2: 100% RA  [id number] Patient tolerated ambulation well with assist x1 and pushing a rolling walker. Gait slow, steady, patient' c/o mild headache that started during ambulation. To bedside after walk, VSS. Exercise guidelines given and CP, NTG use and calling 911 discussed with patient and patient's daughter. Pt verbalizes understanding of instructions given.   Sol Passer, MS, ACSM CCEP

## 2014-01-26 NOTE — Progress Notes (Signed)
MD on call paged and made aware of UA results. Will follow up with day shift.

## 2014-01-26 NOTE — Progress Notes (Addendum)
Subjective:  Complains of chest wall soreness. Complains of a mild nosebleed and clots. This is getting better today. No ischemic pain. Starting to ambulate.  Objective:  Vital Signs in the last 24 hours: BP 123/60  Pulse 66  Temp(Src) 98.6 F (37 C) (Oral)  Resp 18  Ht 5\' 4"  (1.626 m)  Wt 125.147 kg (275 lb 14.4 oz)  BMI 47.33 kg/m2  SpO2 99%  Physical Exam: Very large obese black female in no acute distress Lungs:  Clear Cardiac:  Regular rhythm, normal S1 and S2, no S3 Extremities:  No edema present  Intake/Output from previous day: 10/23 0701 - 10/24 0700 In: 240 [P.O.:240] Out: 900 [Urine:900]  Weight Filed Weights   01/24/14 0034 01/24/14 0300 01/26/14 0452  Weight: 119.778 kg (264 lb 1 oz) 122 kg (268 lb 15.4 oz) 125.147 kg (275 lb 14.4 oz)    Lab Results: Basic Metabolic Panel:  Recent Labs  01/24/14 0051 01/24/14 1029  NA 134* 135*  K 4.5 4.2  CL 98 102  CO2 19 23  GLUCOSE 146* 124*  BUN 16 12  CREATININE 0.64 0.59   CBC:  Recent Labs  01/24/14 1029 01/26/14 0334  WBC 14.4* 14.7*  HGB 11.7* 11.9*  HCT 35.8* 35.9*  MCV 84.0 83.1  PLT 248 225   Cardiac Enzymes:  Recent Labs  01/24/14 0555 01/24/14 1029 01/24/14 1445  TROPONINI 14.36* 15.17* 15.49*    Telemetry: Currently normal sinus rhythm  Assessment/Plan:  1. Recent inferior infarction 2. Morbid obesity but with weight loss since here 3. Prior history of recurrent DVT currently on anticoagulation 4. Ventricular fibrillation in the setting of acute infarction with preserved EF 5. Abnormal urinalysis with culture pending  Recommendations:  Still fairly sore in chest. Complains of mild hoarseness. Watch overnight and if stable possible DC in morning. Await final results of urine culture prior to initiating treatment.      Kerry Hough  MD [facility]  01/26/2014, 11:24 AM

## 2014-01-26 NOTE — Progress Notes (Signed)
ANTICOAGULATION CONSULT NOTE - Follow Up Consult  Pharmacy Consult for warfarin Indication: DVT  Allergies  Allergen Reactions  . Tylenol [Acetaminophen] Other (See Comments)    Liver damage, and upset stomach per patient    Patient Measurements: Height: 5\' 4"  (162.6 cm) Weight: 275 lb 14.4 oz (125.147 kg) IBW/kg (Calculated) : 54.7  Vital Signs: Temp: 98.6 F (37 C) (10/24 0938) Temp Source: Oral (10/24 0938) BP: 123/60 mmHg (10/24 0938) Pulse Rate: 66 (10/24 0938)  Labs:  Recent Labs  01/24/14 0051 01/24/14 0555 01/24/14 1029 01/24/14 1445 01/25/14 0300 01/26/14 0334  HGB 12.6  --  11.7*  --   --  11.9*  HCT 38.6  --  35.8*  --   --  35.9*  PLT 276  --  248  --   --  225  LABPROT 26.1*  --   --   --  26.9* 25.7*  INR 2.36*  --   --   --  2.47* 2.32*  CREATININE 0.64  --  0.59  --   --   --   TROPONINI  --  14.36* 15.17* 15.49*  --   --     Estimated Creatinine Clearance: 82 ml/min (by C-G formula based on Cr of 0.59).   Medications:  Scheduled:  . aspirin  81 mg Oral Daily  . atorvastatin  80 mg Oral q1800  . clopidogrel  75 mg Oral Q breakfast  . metoprolol succinate  25 mg Oral Daily  . pantoprazole  40 mg Oral Daily  . Warfarin - Pharmacist Dosing Inpatient   Does not apply q1800    Assessment: 73 yo F admitted on 01/24/2014 with CP/STEMI. On chronic warfarin for h/o extensive DVT. Pharmacy consulted to continue while inpatient. INR remains therapeutic at 2.32 today, on home dose (7.5 mg PO daily + 5 mg PO Wed/Sun). Hgb/Hct/PLTC WNL, stable, no bleeding noted  Goal of Therapy:  INR 2-3 Monitor platelets by anticoagulation protocol: Yes   Plan:  Coumadin 7.5 mg po x 1 tonight (c/w home dose) Daily INR Monitor s/sx of bleeding  Thank you for allowing pharmacy to be part of this patient's care team  Lashawnta Burgert M. Gregori Abril, Pharm.D Clinical Pharmacy Resident Pager: [phone] 01/26/2014 .10:17 AM

## 2014-01-27 DIAGNOSIS — R739 Hyperglycemia, unspecified: Secondary | ICD-10-CM

## 2014-01-27 DIAGNOSIS — I4901 Ventricular fibrillation: Secondary | ICD-10-CM

## 2014-01-27 DIAGNOSIS — N390 Urinary tract infection, site not specified: Secondary | ICD-10-CM

## 2014-01-27 DIAGNOSIS — E785 Hyperlipidemia, unspecified: Secondary | ICD-10-CM

## 2014-01-27 DIAGNOSIS — I10 Essential (primary) hypertension: Secondary | ICD-10-CM

## 2014-01-27 DIAGNOSIS — G4733 Obstructive sleep apnea (adult) (pediatric): Secondary | ICD-10-CM

## 2014-01-27 HISTORY — DX: Urinary tract infection, site not specified: N39.0

## 2014-01-27 LAB — PROTIME-INR
INR: 2.13 — ABNORMAL HIGH (ref 0.00–1.49)
PROTHROMBIN TIME: 24 s — AB (ref 11.6–15.2)

## 2014-01-27 LAB — URINE CULTURE

## 2014-01-27 MED ORDER — NITROGLYCERIN 0.4 MG SL SUBL: 0.4000 mg | SUBLINGUAL_TABLET | SUBLINGUAL | Status: DC | PRN

## 2014-01-27 MED ORDER — CLOPIDOGREL BISULFATE 75 MG PO TABS: 75.0000 mg | ORAL_TABLET | Freq: Every day | ORAL | Status: DC

## 2014-01-27 MED ORDER — ASPIRIN 81 MG PO CHEW: 81.0000 mg | CHEWABLE_TABLET | Freq: Every day | ORAL | Status: DC

## 2014-01-27 MED ORDER — CIPROFLOXACIN HCL 500 MG PO TABS
500.0000 mg | ORAL_TABLET | Freq: Two times a day (BID) | ORAL | Status: DC
  Administered 2014-01-27: 500 mg via ORAL
  Filled 2014-01-27: qty 1

## 2014-01-27 MED ORDER — ATORVASTATIN CALCIUM 80 MG PO TABS: 80.0000 mg | ORAL_TABLET | Freq: Every day | ORAL | Status: DC

## 2014-01-27 MED ORDER — CIPROFLOXACIN HCL 500 MG PO TABS: 500.0000 mg | ORAL_TABLET | Freq: Two times a day (BID) | ORAL | Status: DC

## 2014-01-27 MED ORDER — WARFARIN SODIUM 5 MG PO TABS: 5.0000 mg | ORAL_TABLET | Freq: Once | ORAL | Status: DC

## 2014-01-27 NOTE — Progress Notes (Signed)
Walked pt 247ft. Pt VS stable before and after walk. Pt felt Short of breath once we got back to the room. O2 100% placed 1L for comfort. Spoke with [place], [place], will order walker for home. Will continue to monitor. Etta Quill, RN

## 2014-01-27 NOTE — Progress Notes (Signed)
ANTICOAGULATION CONSULT NOTE - Follow Up Consult  Pharmacy Consult for warfarin Indication: h/o DVT  Allergies  Allergen Reactions  . Tylenol [Acetaminophen] Other (See Comments)    Liver damage, and upset stomach per patient    Patient Measurements: Height: 5\' 4"  (162.6 cm) Weight: 275 lb 11.2 oz (125.057 kg) IBW/kg (Calculated) : 54.7  Vital Signs: Temp: 98.7 F (37.1 C) (10/25 0619) Temp Source: Oral (10/25 0619) BP: 128/52 mmHg (10/25 1114) Pulse Rate: 63 (10/25 1114)  Labs:  Recent Labs  01/24/14 1445 01/25/14 0300 01/26/14 0334 01/27/14 0502  HGB  --   --  11.9*  --   HCT  --   --  35.9*  --   PLT  --   --  225  --   LABPROT  --  26.9* 25.7* 24.0*  INR  --  2.47* 2.32* 2.13*  TROPONINI 15.49*  --   --   --     Estimated Creatinine Clearance: 82 ml/min (by C-G formula based on Cr of 0.59).   Medications:  Scheduled:  . aspirin  81 mg Oral Daily  . atorvastatin  80 mg Oral q1800  . ciprofloxacin  500 mg Oral BID  . clopidogrel  75 mg Oral Q breakfast  . metoprolol succinate  25 mg Oral Daily  . pantoprazole  40 mg Oral Daily  . Warfarin - Pharmacist Dosing Inpatient   Does not apply q1800    Assessment: 73 yo F admitted on 01/24/2014 with CP/STEMI. On chronic warfarin for h/o extensive DVT. Pharmacy consulted to continue while inpatient. INR remains therapeutic at 2.13 today, on home dose (7.5 mg PO daily + 5 mg PO Wed/Sun). Hgb/Hct/PLTC WNL, stable, no bleeding noted. Of note, Cipro started for UTI x 5 days.  Goal of Therapy:  INR 2-3 Monitor platelets by anticoagulation protocol: Yes   Plan:  Warfarin 5 mg po x 1 tonight (c/w home dose) Daily INR Monitor s/sx of bleeding  Thank you for allowing pharmacy to be part of this patient's care team  Ifeoluwa Bartz M. Ikey Omary, Pharm.D Clinical Pharmacy Resident Pager: [phone] 01/27/2014 .11:57 AM

## 2014-01-27 NOTE — Discharge Summary (Signed)
Physician Discharge Summary      Cardiologist:  McAlhaney(new) was Shirley [name].  Patient requested change Patient ID: Shirley [name] MRN: [id number] DOB/AGE: 05-28-1940 73 y.o.  Admit date: 01/24/2014 Discharge date: 01/27/2014  Admission Diagnoses:  ST elevation myocardial infarction (STEMI) of inferior wall  Discharge Diagnoses:  Active Problems:   ST elevation myocardial infarction (STEMI) of inferior wall   UTI   Obesity     Hx DVT-coumadin   Ventricular fibrillation     HLD   HTN   OSA  Discharged Condition: stable  Hospital Course:   73 yo obese female with HTN, non-obstructive CAD in the past, recently diagnosed with recurrent DVT now on lifelong warfarin after failing rivaroxaban.  She was in her USOH all day but awoke at midnight with crushing SSCP and worsening shortness of breath. Her family immediately called 911. Initial en route ECG was not concerning for STEMI but she had two episodes of VF in the ED and was successfully defibrillated. She underwent CPR x 2 minutes and was given amiodarone 150mg  x 1.  Subsequent EKG revealed 40mm STE inferolaterally and cath lab was activated. Of note she is on lifelong coumadin and her most recent INR 2.6 from 2 days ago.   She was taken emergently to the cath lab and the procedure revealed 99% proximal RCA lesion.  This was treated successfully with PTCA/bare metal stent x 1.  Her systolic function is preserved and this was confirmed by echocardiogram:  EF 55% to 60%. Wall motion was normal.  She will continue on ASA, Plavix and coumadin for one month and then stop the ASA.   She was also put on Toprol 25 daily and liptior 80mg .  She was not on an statin before.  Check LFTs in 6-8weeks.  INR is therapeutic.  She ambulated with cardiac rehab 483ft the day before discharge with out too much difficulty.  She reports continued chest soreness for compressions but no pain with ambulation.  UA was positive for E. Coli.  ABx started at discharge.  The  patient will be referred to a registered dietician for continued weight loss.  The contact information was provided.  The patient was seen by Dr. Jacinta Shoe who felt she was stable for DC home.  Recheck INR early this week since she will on Abx which may affect it.  Consults: Cardiac Rehab  Significant Diagnostic Studies:  Cardiac Catheterization Operative Report  Shirley [name]  [id number]  10/22/20152:26 AM  Merrilee Seashore, MD  Procedure Performed:  1. Left Heart Catheterization 2. Selective Coronary Angiography 3. Left ventricular angiogram 4. PTCA/bare metal stent x 1 proximal RCA 5. Angioseal Right femoral artery Operator: Lauree Chandler, MD  Indication: 73 yo female with history of DVT on chronic coumadin therapy admitted with inferior STEMI. INR 2.4.  Procedure Details:  The risks, benefits, complications, treatment options, and expected outcomes were discussed with the patient. Emergency consent obtained. The patient was brought to the cath lab from the ED. I attempted access in the right radial artery but could not pass my wire. The right groin was prepped and draped in the usual manner. Using the modified Seldinger access technique, a 6 French sheath was placed in the right femoral artery. Standard diagnostic catheters were used to perform selective coronary angiography. She was found to have a sub-totally occluded RCA. I elected to proceed to PCI.  PCI Note: The RCA was engaged with a JR4 guiding catheter. She was given Plavix 600 mg po x 1. Angiomax  weight based bolus followed by a drip. ACT was over 200. Whisper wire down RCA. The vessel was completely occluded before the wire was passed. 2.0 x 12 mm balloon for pre-dilation of lesion x 2. 3.0 x 16 mm Rebel bare metal stent x 1 in the proximal RCA. The stent was post-dilated with a 3.0 x 12 mm [place] balloon x 1. The stenosis was taken from 100% down to 0%. There was excellent flow down the RCA. A pigtail catheter was used to  perform a left ventricular angiogram. Angioseal placed right femoral artery.  There were no immediate complications. The patient was taken to the recovery area in stable condition.  Hemodynamic Findings:  Central aortic pressure: 142/82  Left ventricular pressure: 134/19/27  Angiographic Findings:  Left main: No obstructive disease.  Left Anterior Descending Artery: Large caliber vessel that courses to the apex. Moderate caliber diagonal branch. No obstructive disease.  Circumflex Artery: Large caliber vessel with large obtuse marginal branch. No obstructive disease.  Right Coronary Artery: Moderate caliber co-dominant vessel with sub-totally occluded proximal vessel.  Left Ventricular Angiogram: LVEF=50%  Impression:  1. Inferior STEMI secondary to sub-totally occluded RCA  2. Successful PTCA/bare metal stent x 1 proximal RCA  3. Preserved LV systolic function.  Recommendations: Given her need for ongoing coumadin therapy, I elected to use Plavix and ASA tonight. Will likely resume coumadin tomorrow if no bleeding from groin site. Would treat with ASA, Plavix and coumadin for one month then stop ASA after one month. Will start beta blocker, statin. Admit to CCU. Echo in am.  Complications: None. The patient tolerated the procedure well.   Echocardiogram Study Conclusions  - Left ventricle: The cavity size was normal. There was mild concentric hypertrophy. Systolic function was normal. The estimated ejection fraction was in the range of 55% to 60%. Wall motion was normal; there were no regional wall motion abnormalities. - Aortic valve: There was trivial regurgitation. - Ascending aorta: The ascending aorta was mildly dilated. - Mitral valve: Calcified annulus.   Treatments: See above  Discharge Exam: Blood pressure 102/45, pulse 64, temperature 98.7 F (37.1 C), temperature source Oral, resp. rate 18, height 5\' 4"  (1.626 m), weight 275 lb 11.2 oz (125.057 kg), SpO2 97.00%.     Physical Exam  Constitutional: No distress.  Eyes: Conjunctivae are normal. Pupils are equal, round, and reactive to light.  Cardiovascular: Normal rate, regular rhythm, S1 normal and S2 normal.   No murmur heard. Pulses:      Radial pulses are 2+ on the right side, and 2+ on the left side.       Dorsalis pedis pulses are 2+ on the right side, and 2+ on the left side.  Pulmonary/Chest: Effort normal and breath sounds normal. She has no wheezes.  Musculoskeletal: She exhibits no edema.  Neurological: She is alert and oriented to person, place, and time.  Skin: Skin is warm and dry.    Disposition: 01-Home or Self Care      Discharge Instructions   Amb Referral to Cardiac Rehabilitation    Complete by:  As directed             Medication List    STOP taking these medications       lisinopril-hydrochlorothiazide 10-12.5 MG per tablet  Commonly known as:  PRINZIDE,ZESTORETIC     naproxen sodium 220 MG tablet  Commonly known as:  ANAPROX      TAKE these medications       acetaminophen 500  MG tablet  Commonly known as:  TYLENOL  Take 500 mg by mouth every 6 (six) hours as needed for mild pain.     aspirin 81 MG chewable tablet  Chew 1 tablet (81 mg total) by mouth daily.     atorvastatin 80 MG tablet  Commonly known as:  LIPITOR  Take 1 tablet (80 mg total) by mouth daily at 6 PM.     ciprofloxacin 500 MG tablet  Commonly known as:  CIPRO  Take 1 tablet (500 mg total) by mouth 2 (two) times daily.     clopidogrel 75 MG tablet  Commonly known as:  PLAVIX  Take 1 tablet (75 mg total) by mouth daily with breakfast.     metoprolol succinate 25 MG 24 hr tablet  Commonly known as:  TOPROL-XL  Take 1 tablet (25 mg total) by mouth daily. Take with or immediately following a meal.     multivitamin with minerals Tabs tablet  Take 1 tablet by mouth every morning.     nitroGLYCERIN 0.4 MG SL tablet  Commonly known as:  NITROSTAT  Place 1 tablet (0.4 mg total) under  the tongue every 5 (five) minutes x 3 doses as needed for chest pain.     omeprazole 40 MG capsule  Commonly known as:  PRILOSEC  Take 40 mg by mouth daily.     promethazine-phenylephrine 6.25-5 MG/5ML Syrp  Generic drug:  promethazine-phenylephrine  Take 5 mLs by mouth every 6 (six) hours as needed for congestion.     warfarin 5 MG tablet  Commonly known as:  COUMADIN  Take 5-7.5 mg by mouth See admin instructions. Take 1 tablet (5mg ) on Sunday and Wednesday.  Take 1.5 tablets (7.5mg ) all other days       Follow-up Information   Follow up with Lauree Chandler, MD. (The office will call you with the follow up appt date and time.)    Specialty:  Cardiology   Contact information:   [place] [place] [address] [phone]      Greater than 30 minutes was spent completing the patient's discharge.    SignedTarri Fuller, [place] 01/27/2014, 10:53 AM

## 2014-01-27 NOTE — Care Management Note (Signed)
    Page 1 of 1   01/27/2014     2:23:57 PM CARE MANAGEMENT NOTE 01/27/2014  Patient:  Shirley [name], Shirley [name]   Account Number:  [id number]  Date Initiated:  01/27/2014  Documentation initiated by:  [facility]  Subjective/Objective Assessment:   adm: ST elevation myocardial infarction (STEMI) of inferior wall     Action/Plan:   discharge planning   Anticipated DC Date:  01/27/2014   Anticipated DC Plan:  Shirley [name]  CM consult      PAC Choice  DURABLE MEDICAL EQUIPMENT   Choice offered to / List presented to:     DME arranged  Vassie Moselle      DME agency  [employer].        Status of service:  Completed, signed off Medicare Important Message given?   (If response is "NO", the following Medicare IM given date fields will be blank) Date Medicare IM given:   Medicare IM given by:   Date Additional Medicare IM given:   Additional Medicare IM given by:    Discharge Disposition:  HOME/SELF CARE  Per UR Regulation:    If discussed at Long Length of Stay Meetings, dates discussed:    Comments:  01/27/14 14:20 CM received request from RN to arrange rolling walker.  CM called DME rep, Shirley [name] who will deliver rolling walker to room prior to discharge today.  No other CM needs were communicated.  Mariane Masters, BSN, CM [phone].

## 2014-01-27 NOTE — Discharge Summary (Signed)
The patient was seen and examined, and I agree with the assessment and plan as documented above. She is free of chest pain and shortness of breath this morning, and is eager to go home. She has been trying to work on weight loss and will continue to do so. 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 She will be scheduled for early follow up in the cardiology office on [facility].

## 2014-01-28 ENCOUNTER — Telehealth: Payer: Self-pay | Admitting: Pharmacist

## 2014-01-28 NOTE — Telephone Encounter (Signed)
Pts dtr, Caren Griffins called [facility] to inform us her mother was discharged yesterday from the hospital s/p STEMI; s/p stent placement. New meds: Plavix + ASA (will continue x 1 month then ASA will stop) + Atorvastatin (no expected interaction w/ Warfarin) + Cipro (for UTI; planned 4 days total). I scheduled a new appt for Sanskriti to come this Thursday (rather than 11/2) since she is on Cipro (can elevate INR).   Kennith Center, Pharm.D., CPP 01/28/2014@11 :31 AM

## 2014-01-31 ENCOUNTER — Ambulatory Visit: Payer: BC Managed Care – PPO

## 2014-01-31 ENCOUNTER — Other Ambulatory Visit: Payer: BC Managed Care – PPO

## 2014-01-31 ENCOUNTER — Telehealth: Payer: Self-pay | Admitting: Pharmacist

## 2014-01-31 NOTE — Telephone Encounter (Signed)
Caren Griffins stopped by to R/S lab/CC appt from 01/31/14 for her mother. Her mother forgot her appt today. She would like to R/S to 02/05/14. Lab = 11:30am and Coumadin clinic at 11:45am  Pt unable to come on 10/30 or 11/2. Pt will take her last dose of Cipro today. INR upon discharge = 2.13 on 01/27/14. Caren Griffins knows to call us if her mother experiences any unusual bruising or bleeding.

## 2014-02-01 ENCOUNTER — Encounter: Payer: Self-pay | Admitting: Cardiology

## 2014-02-01 ENCOUNTER — Ambulatory Visit (INDEPENDENT_AMBULATORY_CARE_PROVIDER_SITE_OTHER): Payer: BC Managed Care – PPO | Admitting: Cardiology

## 2014-02-01 VITALS — BP 118/60 | Ht 64.0 in | Wt 260.8 lb

## 2014-02-01 DIAGNOSIS — G4733 Obstructive sleep apnea (adult) (pediatric): Secondary | ICD-10-CM

## 2014-02-01 DIAGNOSIS — I2510 Atherosclerotic heart disease of native coronary artery without angina pectoris: Secondary | ICD-10-CM

## 2014-02-01 DIAGNOSIS — R0789 Other chest pain: Secondary | ICD-10-CM

## 2014-02-01 DIAGNOSIS — E785 Hyperlipidemia, unspecified: Secondary | ICD-10-CM

## 2014-02-01 DIAGNOSIS — R42 Dizziness and giddiness: Secondary | ICD-10-CM | POA: Insufficient documentation

## 2014-02-01 DIAGNOSIS — I82403 Acute embolism and thrombosis of unspecified deep veins of lower extremity, bilateral: Secondary | ICD-10-CM

## 2014-02-01 NOTE — Assessment & Plan Note (Signed)
Uses her CPAP, with new mask.

## 2014-02-01 NOTE — Assessment & Plan Note (Addendum)
Here today for dizziness, BP stable, most likely due to allergies, with post nasal drip. Added clarinex OTC she will follow up in 1-2 weeks to recheck.  EKG stable.

## 2014-02-01 NOTE — Assessment & Plan Note (Signed)
BP and HR stable.  Dizziness occurs with any position.  Add Clarinex 10 mg daily.  Follow up in 1-2 weeks.

## 2014-02-01 NOTE — Assessment & Plan Note (Signed)
Followed by oncology for now.  On coumadin,  Will stay on comadin, asa and plavix for 1 month and then will stop ASA.

## 2014-02-01 NOTE — Assessment & Plan Note (Signed)
Secondary to CPR of 2 min. + pain to touch of back and chest.  Has ultram that is helping.

## 2014-02-01 NOTE — Progress Notes (Signed)
02/01/2014   PCP: Merrilee Seashore, MD   Chief Complaint  Patient presents with  . Follow-up    for dizziness, post MI and hospitalization    Primary Cardiologist: Dr. [name]   HPI:  73 yo obese female with HTN, non-obstructive CAD in the past, recently diagnosed with recurrent DVT now on lifelong warfarin after failing rivaroxaban. She was in her USOH all day but awoke at midnight with crushing SSCP and worsening shortness of breath. Her family immediately called 911. Initial en route ECG was not concerning for STEMI but she had two episodes of VF in the ED and was successfully defibrillated. She underwent CPR x 2 minutes and was given amiodarone 150mg  x 1. Subsequent EKG revealed 31mm STE inferolaterally and cath lab was activated.    She was taken emergently to the cath lab and the procedure revealed 99% proximal RCA lesion. This was treated successfully with PTCA/bare metal stent x 1. Her systolic function is preserved and this was confirmed by echocardiogram: EF 55% to 60%. Wall motion was normal. She will continue on ASA, Plavix and coumadin for one month and then stop the ASA.   Today she is here for dizziness.  She is still wthl chest pain and back pain due to the CPR. She has episodes of dizziness, though her BP here is adequate and at home her daughter checks and BP is good.  She does complain of post nasal drip.  And the need to cough at times.   Allergies  Allergen Reactions  . Tylenol [Acetaminophen] Other (See Comments)    Liver damage, and upset stomach per patient    Current Outpatient Prescriptions  Medication Sig Dispense Refill  . acetaminophen (TYLENOL) 500 MG tablet Take 500 mg by mouth every 6 (six) hours as needed for mild pain.       [name] aspirin 81 MG chewable tablet Chew 1 tablet (81 mg total) by mouth daily.      [name] atorvastatin (LIPITOR) 80 MG tablet Take 1 tablet (80 mg total) by mouth daily at 6 PM.  30 tablet  5  . clopidogrel (PLAVIX)  75 MG tablet Take 1 tablet (75 mg total) by mouth daily with breakfast.  30 tablet  11  . lisinopril-hydrochlorothiazide (PRINZIDE,ZESTORETIC) 10-12.5 MG per tablet       . metoprolol succinate (TOPROL-XL) 25 MG 24 hr tablet Take 1 tablet (25 mg total) by mouth daily. Take with or immediately following a meal.  30 tablet  3  . Multiple Vitamin (MULTIVITAMIN WITH MINERALS) TABS Take 1 tablet by mouth every morning.      . nitroGLYCERIN (NITROSTAT) 0.4 MG SL tablet Place 1 tablet (0.4 mg total) under the tongue every 5 (five) minutes x 3 doses as needed for chest pain.  25 tablet  12  . omeprazole (PRILOSEC) 40 MG capsule Take 40 mg by mouth daily.      . promethazine-phenylephrine (PROMETHAZINE-PHENYLEPHRINE) 6.25-5 MG/5ML SYRP Take 5 mLs by mouth every 6 (six) hours as needed for congestion.      . traMADol (ULTRAM) 50 MG tablet Take by mouth every 6 (six) hours as needed.      . warfarin (COUMADIN) 5 MG tablet Take 5-7.5 mg by mouth See admin instructions. Take 1 tablet (5mg ) on Sunday and Wednesday.  Take 1.5 tablets (7.5mg ) all other days       No current facility-administered medications for this visit.    Past Medical History  Diagnosis  Date  . Hypertension   . Hemorrhoid   . GERD (gastroesophageal reflux disease)   . Adenomatous polyps   . DVT (deep venous thrombosis) 2003    lower extremity DVT with questionable recurrence in 2005. Treated with 1.5 year course of coumadin.  [name] History of TIAs     2004 - no documentation available. 09/2010 -  was not on aspirin or statin therapy prior to event.  . Endometrial thickening on ultra sound 07/2009    path showing Fragments of benign endocervical and squamous mucosa. No dysplasia or malignancy // Followed by Dr. [name]  . Coronary artery disease, non-occlusive 11/2003; 01/2014    LHC (11/2003) - LAD diffuse 30% stenosis of mid-vessel. RCA with 30% stenosis of proximal vessel - by Dr. Dannielle Burn. // 2D Echo (04/2006) -  LV EF 65%.  Mild LVH.  No  wall motion abnormalities.  Mild diastolic dysfunction. 2015 with STEMI- stent palced  . Prediabetes DX: 09/2010    HgA1c 6.1  . Stroke   . Syncope 12/18/2013  . Hx of myocardial infarction less than 8 weeks 01/24/14  . Hyperlipidemia LDL goal <70     Past Surgical History  Procedure Laterality Date  . Tubal ligation    . Knee surgery  1996    Left knee arthroscopy.  . Colonoscopy w/ polypectomy  11/01/2007    8 mm rectal adenoma, hemorrhoids  . Rotator cuff repair      right.  . Coronary angioplasty with stent placement  01/24/14    Inf. STEMI, BMS to RCA    NTI:RWERXVQ:MG colds but + drainage down throat, no fevers, +  weight changes-down 15 pounds Skin:no rashes or ulcers HEENT:no blurred vision, no congestion CV:see HPI PUL:see HPI GI:no diarrhea constipation or melena, no indigestion GU:no hematuria, no dysuria MS:no joint pain, no claudication Neuro:no syncope, no lightheadedness Endo:no diabetes, no thyroid disease  Wt Readings from Last 3 Encounters:  02/01/14 260 lb 12.8 oz (118.298 kg)  01/27/14 275 lb 11.2 oz (125.057 kg)  01/27/14 275 lb 11.2 oz (125.057 kg)    PHYSICAL EXAM BP 118/60  Ht 5\' 4"  (1.626 m)  Wt 260 lb 12.8 oz (118.298 kg)  BMI 44.74 kg/m2 Recheck BP 120/70 in Rt arm, Lt arm 118/60 General:Pleasant affect, NAD Skin:Warm and dry, brisk capillary refill HEENT:normocephalic, sclera clear, mucus membranes moist Neck:supple, no JVD, no bruits, no adenopathy  Heart:S1S2 RRR without murmur, gallup, rub or click Chest wall:  + tenderness to palpation across back and chest wall Lungs:clear without rales, rhonchi, or wheezes QQP:YPPJ, non tender, + BS, do not palpate liver spleen or masses Ext:no lower ext edema, 2+ pedal pulses, 2+ radial pulses Neuro:alert and oriented, MAE, follows commands, + facial symmetry  EKG:SR with 1st degree AV block PR 242 ms LAFB, EKG improved with less t wave inversion in AVF.    ASSESSMENT AND PLAN CAD in  native artery, stent to RCA BMS with MI 2015 Here today for dizziness, BP stable, most likely due to allergies, with post nasal drip. Added clarinex OTC she will follow up in 1-2 weeks to recheck.  EKG stable.  Dizziness BP and HR stable.  Dizziness occurs with any position.  Add Clarinex 10 mg daily.  Follow up in 1-2 weeks.  Chest wall pain Secondary to CPR of 2 min. + pain to touch of back and chest.  Has ultram that is helping.  DVT (deep venous thrombosis) Followed by oncology for now.  On coumadin,  Will  stay on comadin, asa and plavix for 1 month and then will stop ASA.  Obstructive sleep apnea Uses her CPAP, with new mask.  Hyperlipidemia Now on lipitor at 80 mg daily.  Will need to recheck lipid level in 6 weeks or so.

## 2014-02-01 NOTE — Patient Instructions (Signed)
Your physician recommends that you schedule a follow-up appointment in: 2 Weeks with [facility] [place] physician has recommended you make the following change in your medication: Start Claritin 10 mg over the counter meds and continue current medications

## 2014-02-01 NOTE — Assessment & Plan Note (Signed)
Now on lipitor at 80 mg daily.  Will need to recheck lipid level in 6 weeks or so.

## 2014-02-04 ENCOUNTER — Other Ambulatory Visit: Payer: BC Managed Care – PPO

## 2014-02-04 ENCOUNTER — Ambulatory Visit: Payer: BC Managed Care – PPO

## 2014-02-05 ENCOUNTER — Ambulatory Visit (HOSPITAL_BASED_OUTPATIENT_CLINIC_OR_DEPARTMENT_OTHER): Payer: BC Managed Care – PPO | Admitting: Pharmacist

## 2014-02-05 ENCOUNTER — Other Ambulatory Visit (HOSPITAL_BASED_OUTPATIENT_CLINIC_OR_DEPARTMENT_OTHER): Payer: BC Managed Care – PPO

## 2014-02-05 DIAGNOSIS — I82409 Acute embolism and thrombosis of unspecified deep veins of unspecified lower extremity: Secondary | ICD-10-CM

## 2014-02-05 LAB — PROTIME-INR
INR: 2.1 (ref 2.00–3.50)
PROTIME: 25.2 s — AB (ref 10.6–13.4)

## 2014-02-05 LAB — POCT INR: INR: 2.1

## 2014-02-05 NOTE — Progress Notes (Signed)
INR close to goal today.  Ms Shirley [name] is now on ASA and plavix for recent STEMI and stent on 01/24/14.  She will be taking ASA for 1 month.  Ms Shirley [name] has just finished a course of cipro for UTI, last dose 10/29.  She has had no bleeding.  Ms Shirley [name] was instructed to continue coumadin 5mg  (1 tablet) daily when d/c from hospital.  Will continue Coumadin dose for now since INR above 2 and with addition of plavix and ASA.  Her last DVT was 12/19/13 while being prescribed Xarelto.  Will check PT/INR in 1 week.  She would like to come after her f/u with cardiology.  Will have to balance risk of bleeding while on Coumadin/ASA/Plavix with risk of clot since most recent DVT was 7 weeks ago.

## 2014-02-06 ENCOUNTER — Encounter: Payer: BC Managed Care – PPO | Admitting: Physician Assistant

## 2014-02-14 ENCOUNTER — Ambulatory Visit (INDEPENDENT_AMBULATORY_CARE_PROVIDER_SITE_OTHER): Payer: BC Managed Care – PPO | Admitting: Physician Assistant

## 2014-02-14 ENCOUNTER — Encounter: Payer: Self-pay | Admitting: Physician Assistant

## 2014-02-14 VITALS — BP 120/60 | HR 60 | Ht 64.0 in | Wt 264.0 lb

## 2014-02-14 DIAGNOSIS — I10 Essential (primary) hypertension: Secondary | ICD-10-CM | POA: Diagnosis not present

## 2014-02-14 DIAGNOSIS — R0789 Other chest pain: Secondary | ICD-10-CM | POA: Diagnosis not present

## 2014-02-14 DIAGNOSIS — R413 Other amnesia: Secondary | ICD-10-CM | POA: Diagnosis not present

## 2014-02-14 DIAGNOSIS — E785 Hyperlipidemia, unspecified: Secondary | ICD-10-CM | POA: Diagnosis not present

## 2014-02-14 DIAGNOSIS — I2111 ST elevation (STEMI) myocardial infarction involving right coronary artery: Secondary | ICD-10-CM | POA: Diagnosis not present

## 2014-02-14 DIAGNOSIS — R42 Dizziness and giddiness: Secondary | ICD-10-CM | POA: Diagnosis not present

## 2014-02-14 DIAGNOSIS — I2510 Atherosclerotic heart disease of native coronary artery without angina pectoris: Secondary | ICD-10-CM

## 2014-02-14 NOTE — Assessment & Plan Note (Signed)
Last INR was on November 3 and was 2.1.  she is due to have it checked within the next couple days

## 2014-02-14 NOTE — Assessment & Plan Note (Signed)
This seems to be improving. She was started on Claritin at her previous office visit.

## 2014-02-14 NOTE — Assessment & Plan Note (Signed)
She has been referred to registered dietitian for medical nutrition therapy.

## 2014-02-14 NOTE — Assessment & Plan Note (Signed)
She has reported some bilateral arm pain and some pain in rib cage. This could be residual from CPR. Do not think she's having any angina. She is taking aspirin, Plavix, Coumadin. She will discontinue the aspirin on November 25. We have sent a referral to cardiac rehabilitation.

## 2014-02-14 NOTE — Assessment & Plan Note (Signed)
She is on a high-dose statin. We'll check her cholesterol and LFTs in 4 weeks. She is complaining of some lower extremity muscle aches. We may need to reduce reduce the dose or try another statin if this continues

## 2014-02-14 NOTE — Patient Instructions (Signed)
Your physician has recommended you make the following change in your medication:  STOP ASPIRIN ( 81 MG ) ON NOVEMBER 25 TH.   Your physician recommends that you return for a FASTING lipid profile on December 15 between 8-5.   You have been referred to Cardiac Rehab you should be getting a call in the next couple of weeks.  Talked to Nira Conn [phone] will send a note to home health nurse to keep walker for another 4 weeks due to dizziness.  Your physician recommends that you keep your scheduled  follow-up appointment with Dr. [name] on February 12 th @ 8:00 am.

## 2014-02-14 NOTE — Assessment & Plan Note (Signed)
Blood pressure is well controlled 

## 2014-02-14 NOTE — Progress Notes (Signed)
Date:  02/14/2014   ID:  Shirley [name], DOB 04-25-1940, MRN [id number]  PCP:  Merrilee Seashore, MD  Primary Cardiologist:  Julianne Handler    History of Present Illness: Shirley [name] is a 73 y.o. female  with HTN, non-obstructive CAD in the past, recently diagnosed with recurrent DVT now on lifelong warfarin after failing rivaroxaban. She was in her USOH all day but awoke at midnight with crushing SSCP and worsening shortness of breath. Her family immediately called 911. Initial en route ECG was not concerning for STEMI but she had two episodes of VF in the ED and was successfully defibrillated. She underwent CPR x 2 minutes and was given amiodarone 150mg  x 1. Subsequent EKG revealed 98mm STE inferolaterally and cath lab was activated.   She was taken emergently to the cath lab and the procedure revealed 99% proximal RCA lesion. This was treated successfully with PTCA/bare metal stent x 1. Her systolic function is preserved and this was confirmed by echocardiogram: EF 55% to 60%. Wall motion was normal. She will continue on ASA, Plavix and coumadin for one month and then stop the ASA.   She was seen on October 30 for dizziness. she continues to have mild chest pain and back pain due to the CPR. She has episodes of dizziness with ambulation and which have improved since her visit on October 30.  During her last episode at home, her daughter checked her blood pressure and itwas 118/58.  She denies vertigo but does have occasional nausea.    The patient currently denies vomiting, fever, chest pain, shortness of breath, orthopnea, dizziness, PND, cough, congestion, abdominal pain, hematochezia, melena, lower extremity edema, claudication.  Wt Readings from Last 3 Encounters:  02/14/14 119.75 kg (264 lb)  02/01/14 118.298 kg (260 lb 12.8 oz)  01/27/14 125.057 kg (275 lb 11.2 oz)     Past Medical History  Diagnosis Date  . Hypertension   . Hemorrhoid   . GERD (gastroesophageal reflux  disease)   . Adenomatous polyps   . DVT (deep venous thrombosis) 2003    lower extremity DVT with questionable recurrence in 2005. Treated with 1.5 year course of coumadin.  [name] History of TIAs     2004 - no documentation available. 09/2010 -  was not on aspirin or statin therapy prior to event.  . Endometrial thickening on ultra sound 07/2009    path showing Fragments of benign endocervical and squamous mucosa. No dysplasia or malignancy // Followed by Dr. [name]  . Coronary artery disease, non-occlusive 11/2003; 01/2014    LHC (11/2003) - LAD diffuse 30% stenosis of mid-vessel. RCA with 30% stenosis of proximal vessel - by Dr. Dannielle Burn. // 2D Echo (04/2006) -  LV EF 65%.  Mild LVH. No  wall motion abnormalities.  Mild diastolic dysfunction. 2015 with STEMI- stent palced  . Prediabetes DX: 09/2010    HgA1c 6.1  . Stroke   . Syncope 12/18/2013  . Hx of myocardial infarction less than 8 weeks 01/24/14  . Hyperlipidemia LDL goal <70     Current Outpatient Prescriptions  Medication Sig Dispense Refill  . acetaminophen (TYLENOL) 500 MG tablet Take 500 mg by mouth every 6 (six) hours as needed for mild pain.     [name] aspirin 81 MG chewable tablet Chew 1 tablet (81 mg total) by mouth daily.    [name] atorvastatin (LIPITOR) 80 MG tablet Take 1 tablet (80 mg total) by mouth daily at 6 PM. 30 tablet 5  .  clopidogrel (PLAVIX) 75 MG tablet Take 1 tablet (75 mg total) by mouth daily with breakfast. 30 tablet 11  . lisinopril-hydrochlorothiazide (PRINZIDE,ZESTORETIC) 10-12.5 MG per tablet     . metoprolol succinate (TOPROL-XL) 25 MG 24 hr tablet Take 1 tablet (25 mg total) by mouth daily. Take with or immediately following a meal. 30 tablet 3  . Multiple Vitamin (MULTIVITAMIN WITH MINERALS) TABS Take 1 tablet by mouth every morning.    . nitroGLYCERIN (NITROSTAT) 0.4 MG SL tablet Place 1 tablet (0.4 mg total) under the tongue every 5 (five) minutes x 3 doses as needed for chest pain. 25 tablet 12  .  omeprazole (PRILOSEC) 40 MG capsule Take 40 mg by mouth daily.    . promethazine-phenylephrine (PROMETHAZINE-PHENYLEPHRINE) 6.25-5 MG/5ML SYRP Take 5 mLs by mouth every 6 (six) hours as needed for congestion.    . traMADol (ULTRAM) 50 MG tablet Take by mouth every 6 (six) hours as needed.    . warfarin (COUMADIN) 5 MG tablet Take 5-7.5 mg by mouth See admin instructions. Take 1 tablet every day.     No current facility-administered medications for this visit.    Allergies:   No Known Allergies  Social History:  The patient  reports that she has never smoked. She has never used smokeless tobacco. She reports that she does not drink alcohol or use illicit drugs.   Family history:   Family History  Problem Relation Age of Onset  . Thyroid cancer Sister   . Liver cancer Sister   . Diabetes Mother   . Hyperlipidemia Mother   . Heart disease Mother   . Dementia Mother   . Angina Father   . Heart disease Brother     x 2 in 30s yo  . Alzheimer's disease Maternal Grandmother     ROS:  Please see the history of present illness.  All other systems reviewed and negative.   PHYSICAL EXAM: VS:  BP 120/60 mmHg  Pulse 60  Ht 5\' 4"  (1.626 m)  Wt 119.75 kg (264 lb)  BMI 45.29 kg/m2 obese, well developed, in no acute distress HEENT: Pupils are equal round react to light accommodation extraocular movements are intact.  Neck:No cervical lymphadenopathy. Cardiac: Regular rate and rhythm without murmurs rubs or gallops. Lungs:  clear to auscultation bilaterally, no wheezing, rhonchi or rales Abd: soft, nontender, positive bowel sounds all quadrants, Ext: no lower extremity edema.  2+ radial and dorsalis pedis pulses. Skin: warm and dry Neuro:  Grossly normal  ASSESSMENT AND PLAN:  Problem List Items Addressed This Visit    CAD in native artery, stent to RCA BMS with MI 2015    She has reported some bilateral arm pain and some pain in rib cage. This could be residual from CPR. Do not think  she's having any angina. She is taking aspirin, Plavix, Coumadin. She will discontinue the aspirin on November 25. We have sent a referral to cardiac rehabilitation.    Chest wall pain   Dizziness    This seems to be improving. She was started on Claritin at her previous office visit.    Hyperlipidemia    She is on a high-dose statin. We'll check her cholesterol and LFTs in 4 weeks. She is complaining of some lower extremity muscle aches. We may need to reduce reduce the dose or try another statin if this continues    Hypertension (Chronic)    Blood pressure is well-controlled.    Memory difficulties    Her daughter  will continue to monitor and follow up with her primary care provider    Morbid obesity    She has been referred to registered dietitian for medical nutrition therapy.     Other Visit Diagnoses    Hyperlipemia    -  Primary    Relevant Orders       Lipid Profile       Hepatic function panel       AMB referral to cardiac rehabilitation    ST elevation myocardial infarction involving right coronary artery        Relevant Orders       Lipid Profile       Hepatic function panel       AMB referral to cardiac rehabilitation

## 2014-02-14 NOTE — Assessment & Plan Note (Signed)
Her daughter will continue to monitor and follow up with her primary care provider

## 2014-02-15 ENCOUNTER — Other Ambulatory Visit (HOSPITAL_BASED_OUTPATIENT_CLINIC_OR_DEPARTMENT_OTHER): Payer: BC Managed Care – PPO

## 2014-02-15 ENCOUNTER — Ambulatory Visit (HOSPITAL_BASED_OUTPATIENT_CLINIC_OR_DEPARTMENT_OTHER): Payer: Self-pay | Admitting: Pharmacist

## 2014-02-15 DIAGNOSIS — I82409 Acute embolism and thrombosis of unspecified deep veins of unspecified lower extremity: Secondary | ICD-10-CM

## 2014-02-15 LAB — POCT INR: INR: 2.4

## 2014-02-15 LAB — PROTIME-INR
INR: 2.4 (ref 2.00–3.50)
Protime: 28.8 Seconds — ABNORMAL HIGH (ref 10.6–13.4)

## 2014-02-15 MED ORDER — WARFARIN SODIUM 5 MG PO TABS
ORAL_TABLET | ORAL | Status: DC
Start: 2014-02-15 — End: 2014-04-09

## 2014-02-15 NOTE — Patient Instructions (Signed)
INR at goal No changes Continue coumadin 5mg  daily.  Return 02/25/14 at 9am for lab; 9:15am for Coumadin clinic

## 2014-02-15 NOTE — Progress Notes (Signed)
INR right at goal [name] is doing well after recent procedure She reports no unusual bleeding or bruising No missed or extra doses of coumadin No other medication or diet changes Ms Shirley [name] is now on ASA and plavix in addition to coumadin for recent STEMI and stent on 01/24/14. She will be taking ASA for 1 month. She saw her cardiologist yesterday Her cardiologist instructed her to take aspirin until 11/26 and then stop.  I instructed [name] to be vigilant about checking for bruising and bleeding and to let us know if any symptoms arise as she is on three agents that can increase her risk for bleeding [name] does state she is fatigued and tired and becomes dizzy at times when walking around. Her cardiologist has reassured her this is normal and the healing process will take some time. She is in good spirits Plan:  Continue coumadin 5mg  daily.   Return 02/25/14 at 9am for lab; 9:15am for Coumadin clinic

## 2014-02-25 ENCOUNTER — Other Ambulatory Visit (HOSPITAL_BASED_OUTPATIENT_CLINIC_OR_DEPARTMENT_OTHER): Payer: BC Managed Care – PPO

## 2014-02-25 ENCOUNTER — Telehealth: Payer: Self-pay | Admitting: Oncology

## 2014-02-25 ENCOUNTER — Ambulatory Visit (HOSPITAL_BASED_OUTPATIENT_CLINIC_OR_DEPARTMENT_OTHER): Payer: Self-pay | Admitting: Pharmacist

## 2014-02-25 DIAGNOSIS — I82409 Acute embolism and thrombosis of unspecified deep veins of unspecified lower extremity: Secondary | ICD-10-CM

## 2014-02-25 LAB — PROTIME-INR
INR: 2.2 (ref 2.00–3.50)
Protime: 26.4 Seconds — ABNORMAL HIGH (ref 10.6–13.4)

## 2014-02-25 NOTE — Telephone Encounter (Signed)
per Melissa to sch CC-pt aware °

## 2014-02-25 NOTE — Progress Notes (Signed)
INR right below goal.  Shirley [name] is still on Plavix and ASA.  No bleeding or unusual bruising.  Will continue coumadin 5mg  daily.  Per previous note, Shirley [name] only to be on ASA for 1 month after stent placed.  Shirley [name] is to call cardiology to see when to stop ASA.  Also to see when she should be starting cardiac rehab.  Will check PT/INR in 3 weeks.

## 2014-02-26 ENCOUNTER — Telehealth: Payer: Self-pay

## 2014-02-26 NOTE — Telephone Encounter (Signed)
[name] daughter called today to let us know that her mother will stop her aspirin therapy tomorrow (11/25) per the doctor's instructions. She also wanted to let us know that they are still waiting on a call from cardiac rehab to know when that will begin.   She also asked about her mother's dose of Coumadin, if she should still be taking one tablet every day since her INR was low. Per the Coumadin Clinic Pharmacist who saw [name], the plan is to continue Coumadin 5 mg daily.

## 2014-03-05 ENCOUNTER — Telehealth: Payer: Self-pay | Admitting: Cardiovascular Disease

## 2014-03-05 NOTE — Telephone Encounter (Signed)
Daughter calling wanting to know status of her Mom's referral to cardiac rehab.  Called rehab and was told waiting on papers to be signed by Dr. [name] to schedule pt to start rehab.  Advised daughter will send message to Dr. Julianne Handler and his nurse Alric Seton to evaluate and fax papers back to rehab

## 2014-03-05 NOTE — Telephone Encounter (Signed)
New Message  Pt daughter called states that the pt was told that she would have a referral to the cardiac rehab and she hasn't heard from anyone.. Please call//

## 2014-03-06 NOTE — Telephone Encounter (Signed)
Referral was sent to Cardiac Rehab on 11/30.  I spoke with cardiac rehab and they did not receive.  Form refaxed at this time. I placed call to pt's daughter and left message to call back

## 2014-03-06 NOTE — Telephone Encounter (Signed)
°  F/U  Patient daughter returning call and would liked to be reached at [phone].

## 2014-03-06 NOTE — Telephone Encounter (Signed)
Spoke with pt's daughter and told her form was resent to Cardiac Rehab today.

## 2014-03-06 NOTE — Telephone Encounter (Signed)
Pat, Can we check on this? Thanks, chris

## 2014-03-14 ENCOUNTER — Encounter (HOSPITAL_COMMUNITY)
Admission: RE | Admit: 2014-03-14 | Discharge: 2014-03-14 | Disposition: A | Discharge status: Home / Self Care | Payer: BC Managed Care – PPO | Source: Ambulatory Visit | Attending: Cardiovascular Disease | Admitting: Cardiovascular Disease

## 2014-03-14 ENCOUNTER — Encounter (HOSPITAL_COMMUNITY): Payer: Self-pay | Admitting: Cardiovascular Disease

## 2014-03-14 DIAGNOSIS — I4901 Ventricular fibrillation: Secondary | ICD-10-CM | POA: Insufficient documentation

## 2014-03-14 DIAGNOSIS — I2582 Chronic total occlusion of coronary artery: Secondary | ICD-10-CM | POA: Insufficient documentation

## 2014-03-14 DIAGNOSIS — G4733 Obstructive sleep apnea (adult) (pediatric): Secondary | ICD-10-CM | POA: Insufficient documentation

## 2014-03-14 DIAGNOSIS — Z6841 Body Mass Index (BMI) 40.0 and over, adult: Secondary | ICD-10-CM | POA: Insufficient documentation

## 2014-03-14 DIAGNOSIS — I2510 Atherosclerotic heart disease of native coronary artery without angina pectoris: Secondary | ICD-10-CM | POA: Insufficient documentation

## 2014-03-14 DIAGNOSIS — I10 Essential (primary) hypertension: Secondary | ICD-10-CM | POA: Insufficient documentation

## 2014-03-14 DIAGNOSIS — E785 Hyperlipidemia, unspecified: Secondary | ICD-10-CM | POA: Insufficient documentation

## 2014-03-14 DIAGNOSIS — Z955 Presence of coronary angioplasty implant and graft: Secondary | ICD-10-CM | POA: Insufficient documentation

## 2014-03-14 DIAGNOSIS — K219 Gastro-esophageal reflux disease without esophagitis: Secondary | ICD-10-CM | POA: Insufficient documentation

## 2014-03-14 DIAGNOSIS — Z5189 Encounter for other specified aftercare: Secondary | ICD-10-CM | POA: Insufficient documentation

## 2014-03-14 DIAGNOSIS — Z8673 Personal history of transient ischemic attack (TIA), and cerebral infarction without residual deficits: Secondary | ICD-10-CM | POA: Insufficient documentation

## 2014-03-14 DIAGNOSIS — Z8249 Family history of ischemic heart disease and other diseases of the circulatory system: Secondary | ICD-10-CM | POA: Insufficient documentation

## 2014-03-14 DIAGNOSIS — Z7901 Long term (current) use of anticoagulants: Secondary | ICD-10-CM | POA: Insufficient documentation

## 2014-03-14 DIAGNOSIS — Z86718 Personal history of other venous thrombosis and embolism: Secondary | ICD-10-CM | POA: Insufficient documentation

## 2014-03-14 DIAGNOSIS — I252 Old myocardial infarction: Secondary | ICD-10-CM | POA: Insufficient documentation

## 2014-03-14 NOTE — Progress Notes (Signed)
Cardiac Rehab Medication Review by a Pharmacist  Does the patient  feel that his/her medications are working for him/her?  yes  Has the patient been experiencing any side effects to the medications prescribed?  no  Does the patient measure his/her own blood pressure or blood glucose at home?  no ; DAUGHTER STATES THEY WILL START  Does the patient have any problems obtaining medications due to transportation or finances?   no  Understanding of regimen: excellent Understanding of indications: excellent Potential of compliance: excellent    Pharmacist comments: daughter involved with care and patient is active with her own care.    Shirley [name] 03/14/2014 8:08 AM

## 2014-03-18 ENCOUNTER — Ambulatory Visit (HOSPITAL_COMMUNITY)
Admission: RE | Admit: 2014-03-18 | Discharge: 2014-03-18 | Disposition: A | Discharge status: Home / Self Care | Payer: BC Managed Care – PPO | Source: Ambulatory Visit | Attending: Internal Medicine | Admitting: Internal Medicine

## 2014-03-18 ENCOUNTER — Telehealth: Payer: Self-pay | Admitting: Physician Assistant

## 2014-03-18 ENCOUNTER — Encounter (HOSPITAL_COMMUNITY)
Admission: RE | Admit: 2014-03-18 | Discharge: 2014-03-18 | Disposition: A | Discharge status: Home / Self Care | Payer: BC Managed Care – PPO | Source: Ambulatory Visit | Attending: Cardiovascular Disease | Admitting: Cardiovascular Disease

## 2014-03-18 DIAGNOSIS — I10 Essential (primary) hypertension: Secondary | ICD-10-CM | POA: Diagnosis not present

## 2014-03-18 DIAGNOSIS — I2510 Atherosclerotic heart disease of native coronary artery without angina pectoris: Secondary | ICD-10-CM | POA: Diagnosis not present

## 2014-03-18 DIAGNOSIS — Z86718 Personal history of other venous thrombosis and embolism: Secondary | ICD-10-CM | POA: Diagnosis not present

## 2014-03-18 DIAGNOSIS — K219 Gastro-esophageal reflux disease without esophagitis: Secondary | ICD-10-CM | POA: Diagnosis not present

## 2014-03-18 DIAGNOSIS — Z5189 Encounter for other specified aftercare: Secondary | ICD-10-CM | POA: Diagnosis present

## 2014-03-18 DIAGNOSIS — I2582 Chronic total occlusion of coronary artery: Secondary | ICD-10-CM | POA: Diagnosis not present

## 2014-03-18 DIAGNOSIS — Z6841 Body Mass Index (BMI) 40.0 and over, adult: Secondary | ICD-10-CM | POA: Diagnosis not present

## 2014-03-18 DIAGNOSIS — Z8249 Family history of ischemic heart disease and other diseases of the circulatory system: Secondary | ICD-10-CM | POA: Diagnosis not present

## 2014-03-18 DIAGNOSIS — Z8673 Personal history of transient ischemic attack (TIA), and cerebral infarction without residual deficits: Secondary | ICD-10-CM | POA: Diagnosis not present

## 2014-03-18 DIAGNOSIS — I252 Old myocardial infarction: Secondary | ICD-10-CM | POA: Diagnosis not present

## 2014-03-18 DIAGNOSIS — E785 Hyperlipidemia, unspecified: Secondary | ICD-10-CM | POA: Diagnosis not present

## 2014-03-18 DIAGNOSIS — I4901 Ventricular fibrillation: Secondary | ICD-10-CM | POA: Diagnosis not present

## 2014-03-18 DIAGNOSIS — Z7901 Long term (current) use of anticoagulants: Secondary | ICD-10-CM | POA: Diagnosis not present

## 2014-03-18 DIAGNOSIS — G4733 Obstructive sleep apnea (adult) (pediatric): Secondary | ICD-10-CM | POA: Diagnosis not present

## 2014-03-18 DIAGNOSIS — Z955 Presence of coronary angioplasty implant and graft: Secondary | ICD-10-CM | POA: Diagnosis not present

## 2014-03-18 NOTE — Progress Notes (Addendum)
Patient is here for her first day of exercise at cardiac rehab. Telemetry rhythm Sinus with a first degree heart block. Vital sign stable. Patient reported having left anterior chest pressure with radiation to her left neck.while exercising on the arm ergometer. Exercise stopped. Patient placed on 4l/min of oxygen. 12 lead ECG obtained.  Patient reported the discomfort a 3 on a 1-10 scale.  Patient reported a decrease in her discomfort after resting. Patients husband here and aware of events. Loma Sousa [facility] paged. 10:45  Patient reports the discomfort has completely resolved. Loma Sousa [facility] reviewed 12 lead ECG and talked with Mrs Nevin Bloodgood over the phone. Hal said the patient may go home and may return to exercise on Wednesday. Patient knows to call Dr Camillia Herter office or to go to the ED if she has more chest pain. Exit blood pressure 108/60. Heart rate 50.

## 2014-03-18 NOTE — Telephone Encounter (Signed)
Contacted by cardiac rehab Verdis Frederickson regarding patient who had single vessel inferior MI in Oct s/p BMS to subtotally occluded RCA. She has h/o recurrent DVT on coumadin. Patient is currrently off ASA and on plavix and coumadin to avoid triple therapy. She had some CP during cardiac rehab today, however no CP with walking at home. I have discussed with patient regarding treatment options, since she just had a cardiac cath recently, unlikely to have ACS esp since CP completely resolved by the time cardiac rehab nurse contacted [place]. EKG shows TWI in inferior lead persistent since Oct. Mild ST changes in I and aVL also present in Oct. I have instructed the patient to contact cardiology if has recurrent CP for flex clinic and possible outpatient myoview vs ED visit if CP become persistent and severe.  Hilbert Corrigan PA Pager: [phone]

## 2014-03-18 NOTE — Telephone Encounter (Signed)
Thanks. Agree. cdm 

## 2014-03-18 NOTE — Progress Notes (Signed)
PHQ score = 1.  Mrs Shirley [name] says that she has been depressed since her hospitalization not being able to drive.  Patient denies being suicidal.  " I will feel better after I can do more for myself."  I offered to set up an appointment for Mrs Shirley [name] to meet with the hospital chaplain. Mrs Shirley [name] declined and says she will meet with her own pastor when her daughter can take her. No psychosocial needs assessed at this time. Will continue to monitor the patient throughout  the program.

## 2014-03-19 ENCOUNTER — Telehealth: Payer: Self-pay | Admitting: Hematology

## 2014-03-19 ENCOUNTER — Other Ambulatory Visit (INDEPENDENT_AMBULATORY_CARE_PROVIDER_SITE_OTHER): Payer: BC Managed Care – PPO | Admitting: *Deleted

## 2014-03-19 ENCOUNTER — Other Ambulatory Visit (HOSPITAL_BASED_OUTPATIENT_CLINIC_OR_DEPARTMENT_OTHER): Payer: BC Managed Care – PPO

## 2014-03-19 ENCOUNTER — Ambulatory Visit (HOSPITAL_BASED_OUTPATIENT_CLINIC_OR_DEPARTMENT_OTHER): Payer: BC Managed Care – PPO | Admitting: Pharmacist

## 2014-03-19 DIAGNOSIS — I82409 Acute embolism and thrombosis of unspecified deep veins of unspecified lower extremity: Secondary | ICD-10-CM

## 2014-03-19 DIAGNOSIS — I2111 ST elevation (STEMI) myocardial infarction involving right coronary artery: Secondary | ICD-10-CM

## 2014-03-19 DIAGNOSIS — E785 Hyperlipidemia, unspecified: Secondary | ICD-10-CM

## 2014-03-19 LAB — LIPID PANEL
Cholesterol: 107 mg/dL (ref 0–200)
HDL: 24.4 mg/dL — ABNORMAL LOW (ref >39.00)
LDL CALC: 61 mg/dL (ref 0–99)
NONHDL: 82.6
Total CHOL/HDL Ratio: 4
Triglycerides: 110 mg/dL (ref 0.0–149.0)
VLDL: 22 mg/dL (ref 0.0–40.0)

## 2014-03-19 LAB — PROTIME-INR
INR: 1.4 — ABNORMAL LOW (ref 2.00–3.50)
Protime: 16.8 Seconds — ABNORMAL HIGH (ref 10.6–13.4)

## 2014-03-19 LAB — HEPATIC FUNCTION PANEL
ALT: 22 U/L (ref 0–35)
AST: 19 U/L (ref 0–37)
Albumin: 3.1 g/dL — ABNORMAL LOW (ref 3.5–5.2)
Alkaline Phosphatase: 127 U/L — ABNORMAL HIGH (ref 39–117)
BILIRUBIN TOTAL: 0.5 mg/dL (ref 0.2–1.2)
Bilirubin, Direct: 0 mg/dL (ref 0.0–0.3)
Total Protein: 6.4 g/dL (ref 6.0–8.3)

## 2014-03-19 LAB — POCT INR: INR: 1.4

## 2014-03-19 NOTE — Progress Notes (Signed)
INR = 1.4   Goal 2.5-3 INR below goal range. No complications noted. Patient stopped taking ASA 81 mg on 02/28/14, no other medication changes. No dietary changes. No missed doses of Coumadin. Spoke with Dr. Burr Medico, no current symptoms of clotting and most recent DVT 3 months ago so will not bridge with Lovenox. Will see patient soon since she is subtherapeutic today. She is aware that she is to call us if she has any symptoms, she states understanding. We will change Coumadin to 5mg  daily except 7.5 mg on Tues, Thurs, Sat. She has not yet taken today's dose and will take 7.5 mg today. She will return 03/26/14 at 9:30am for lab; 9:45am for Coumadin clinic.  Shirley [name], PharmD

## 2014-03-19 NOTE — Telephone Encounter (Signed)
, °

## 2014-03-20 ENCOUNTER — Other Ambulatory Visit: Payer: Self-pay | Admitting: *Deleted

## 2014-03-20 ENCOUNTER — Encounter (HOSPITAL_COMMUNITY)
Admission: RE | Admit: 2014-03-20 | Discharge: 2014-03-20 | Disposition: A | Discharge status: Home / Self Care | Payer: BC Managed Care – PPO | Source: Ambulatory Visit | Attending: Cardiovascular Disease | Admitting: Cardiovascular Disease

## 2014-03-20 ENCOUNTER — Telehealth: Payer: Self-pay | Admitting: Hematology

## 2014-03-20 DIAGNOSIS — I82403 Acute embolism and thrombosis of unspecified deep veins of lower extremity, bilateral: Secondary | ICD-10-CM

## 2014-03-20 DIAGNOSIS — Z5189 Encounter for other specified aftercare: Secondary | ICD-10-CM | POA: Diagnosis not present

## 2014-03-20 NOTE — Telephone Encounter (Signed)
, °

## 2014-03-20 NOTE — Progress Notes (Signed)
Shirley [name] completed her first day of exercise without complaints or chest pain. There were no vitals taken for this visit. Vital signs stable. Will continue to monitor the patient throughout  the program.

## 2014-03-21 ENCOUNTER — Telehealth: Payer: Self-pay | Admitting: Cardiovascular Disease

## 2014-03-21 NOTE — Telephone Encounter (Signed)
Spoke with pt's daughter who is returning a call to office. Call that was placed was from medical records notifying pt paperwork was ready to be picked up.  I gave daughter this information.  I also told her lab results had not been reviewed by provider yet and we would call her when results available.

## 2014-03-21 NOTE — Telephone Encounter (Signed)
New message ° ° ° ° °Returning a nurses call to get lab results °

## 2014-03-22 ENCOUNTER — Encounter (HOSPITAL_COMMUNITY)
Admission: RE | Admit: 2014-03-22 | Discharge: 2014-03-22 | Disposition: A | Discharge status: Home / Self Care | Payer: BC Managed Care – PPO | Source: Ambulatory Visit | Attending: Cardiovascular Disease | Admitting: Cardiovascular Disease

## 2014-03-22 DIAGNOSIS — Z5189 Encounter for other specified aftercare: Secondary | ICD-10-CM | POA: Diagnosis not present

## 2014-03-25 ENCOUNTER — Encounter (HOSPITAL_COMMUNITY)
Admission: RE | Admit: 2014-03-25 | Discharge: 2014-03-25 | Disposition: A | Discharge status: Home / Self Care | Payer: BC Managed Care – PPO | Source: Ambulatory Visit | Attending: Cardiovascular Disease | Admitting: Cardiovascular Disease

## 2014-03-25 DIAGNOSIS — Z5189 Encounter for other specified aftercare: Secondary | ICD-10-CM | POA: Diagnosis not present

## 2014-03-26 ENCOUNTER — Ambulatory Visit (HOSPITAL_BASED_OUTPATIENT_CLINIC_OR_DEPARTMENT_OTHER): Payer: BC Managed Care – PPO | Admitting: Lab

## 2014-03-26 ENCOUNTER — Ambulatory Visit (HOSPITAL_BASED_OUTPATIENT_CLINIC_OR_DEPARTMENT_OTHER): Payer: BC Managed Care – PPO | Admitting: Pharmacist

## 2014-03-26 DIAGNOSIS — I82409 Acute embolism and thrombosis of unspecified deep veins of unspecified lower extremity: Secondary | ICD-10-CM

## 2014-03-26 LAB — PROTIME-INR
INR: 1.8 — ABNORMAL LOW (ref 2.00–3.50)
Protime: 21.6 Seconds — ABNORMAL HIGH (ref 10.6–13.4)

## 2014-03-26 LAB — POCT INR: INR: 1.8

## 2014-03-26 NOTE — Patient Instructions (Signed)
INR below goal but increasing Change Coumadin to 7.5mg  daily except 5 mg on Mondays and Fridays.   Return 04/09/14 at 8:45am for lab, 9am MD visit with Dr. Burr Medico and 9:30am coumadin clinic appointment

## 2014-03-26 NOTE — Progress Notes (Signed)
INR below goal but increasing Pt is doing well today with no complaints She states she has been avoiding greens since we last week when we increased her dose Pt has taken coumadin as instructed No missed or extra doses No unusual bleeding or bruising and no S/Sx of clot No other meds or diet changes Will make slight dose increase this week Plan: Change Coumadin to 7.5mg  daily except 5 mg on Mondays and Fridays.   Return 04/09/14 at 8:45am for lab, 9am MD visit with Dr. Burr Medico and 9:30am coumadin clinic appointment

## 2014-03-27 ENCOUNTER — Encounter (HOSPITAL_COMMUNITY)
Admission: RE | Admit: 2014-03-27 | Discharge: 2014-03-27 | Disposition: A | Discharge status: Home / Self Care | Payer: BC Managed Care – PPO | Source: Ambulatory Visit | Attending: Cardiovascular Disease | Admitting: Cardiovascular Disease

## 2014-03-27 DIAGNOSIS — Z5189 Encounter for other specified aftercare: Secondary | ICD-10-CM | POA: Diagnosis not present

## 2014-03-27 NOTE — Progress Notes (Signed)
Reviewed home exercise with pt today.  Pt plans to walk at home for exercise.  Pt also expressed interest in returning back to her dance classes.  I told her to just take her time returning and take rest breaks.  She also has a treadmill at home for when the weather is too bad to walk outside.  Reviewed THR, pulse, RPE, sign and symptoms, NTG use, and when to call 911 or MD.  Pt voiced understanding.  Pt declined Spanish translation copy saying that she can understand the true meaning better from the [place] version. Alberteen Sam, MA, ACSM RCEP

## 2014-04-01 ENCOUNTER — Encounter (HOSPITAL_COMMUNITY)
Admission: RE | Admit: 2014-04-01 | Discharge: 2014-04-01 | Disposition: A | Discharge status: Home / Self Care | Payer: BC Managed Care – PPO | Source: Ambulatory Visit | Attending: Cardiovascular Disease | Admitting: Cardiovascular Disease

## 2014-04-01 DIAGNOSIS — Z5189 Encounter for other specified aftercare: Secondary | ICD-10-CM | POA: Diagnosis not present

## 2014-04-02 ENCOUNTER — Ambulatory Visit (HOSPITAL_COMMUNITY)
Admission: RE | Admit: 2014-04-02 | Discharge: 2014-04-02 | Disposition: A | Discharge status: Home / Self Care | Payer: BC Managed Care – PPO | Source: Ambulatory Visit | Attending: Hematology | Admitting: Hematology

## 2014-04-02 DIAGNOSIS — Z86718 Personal history of other venous thrombosis and embolism: Secondary | ICD-10-CM

## 2014-04-02 DIAGNOSIS — Z7901 Long term (current) use of anticoagulants: Secondary | ICD-10-CM | POA: Insufficient documentation

## 2014-04-02 DIAGNOSIS — I82403 Acute embolism and thrombosis of unspecified deep veins of lower extremity, bilateral: Secondary | ICD-10-CM

## 2014-04-02 NOTE — Progress Notes (Signed)
Heart rate noted at 51 yesterday during cool down at cardiac rehab nonsustained. Sinus Shirley [name]. Vital signs stable.Will fax exercise flow sheets to Dr. Camillia Herter office for review with ECG tracing.. Will continue to monitor the patient throughout  the program.

## 2014-04-02 NOTE — Progress Notes (Signed)
VASCULAR LAB PRELIMINARY  PRELIMINARY  PRELIMINARY  PRELIMINARY  Bilateral lower extremity venous duplex completed.    Preliminary report:  Bilateral:  No evidence of DVT, superficial thrombosis, or Baker's Cyst. Previous DVT of the right and left lower extremity appears to have resolved.   Shirley [name], RVS 04/02/2014, 11:17 AM

## 2014-04-03 ENCOUNTER — Encounter (HOSPITAL_COMMUNITY)
Admission: RE | Admit: 2014-04-03 | Discharge: 2014-04-03 | Disposition: A | Discharge status: Home / Self Care | Payer: BC Managed Care – PPO | Source: Ambulatory Visit | Attending: Cardiovascular Disease | Admitting: Cardiovascular Disease

## 2014-04-03 DIAGNOSIS — Z5189 Encounter for other specified aftercare: Secondary | ICD-10-CM | POA: Diagnosis not present

## 2014-04-08 ENCOUNTER — Other Ambulatory Visit: Payer: BC Managed Care – PPO

## 2014-04-08 ENCOUNTER — Encounter (HOSPITAL_COMMUNITY)
Admission: RE | Admit: 2014-04-08 | Discharge: 2014-04-08 | Disposition: A | Discharge status: Home / Self Care | Payer: BC Managed Care – PPO | Source: Ambulatory Visit | Attending: Cardiovascular Disease | Admitting: Cardiovascular Disease

## 2014-04-08 ENCOUNTER — Ambulatory Visit: Payer: BC Managed Care – PPO

## 2014-04-08 DIAGNOSIS — Z8249 Family history of ischemic heart disease and other diseases of the circulatory system: Secondary | ICD-10-CM | POA: Diagnosis not present

## 2014-04-08 DIAGNOSIS — Z5189 Encounter for other specified aftercare: Secondary | ICD-10-CM | POA: Insufficient documentation

## 2014-04-08 DIAGNOSIS — E785 Hyperlipidemia, unspecified: Secondary | ICD-10-CM | POA: Insufficient documentation

## 2014-04-08 DIAGNOSIS — I252 Old myocardial infarction: Secondary | ICD-10-CM | POA: Diagnosis not present

## 2014-04-08 DIAGNOSIS — I2582 Chronic total occlusion of coronary artery: Secondary | ICD-10-CM | POA: Insufficient documentation

## 2014-04-08 DIAGNOSIS — Z7901 Long term (current) use of anticoagulants: Secondary | ICD-10-CM | POA: Insufficient documentation

## 2014-04-08 DIAGNOSIS — I2510 Atherosclerotic heart disease of native coronary artery without angina pectoris: Secondary | ICD-10-CM | POA: Diagnosis not present

## 2014-04-08 DIAGNOSIS — I4901 Ventricular fibrillation: Secondary | ICD-10-CM | POA: Insufficient documentation

## 2014-04-08 DIAGNOSIS — I10 Essential (primary) hypertension: Secondary | ICD-10-CM | POA: Insufficient documentation

## 2014-04-08 DIAGNOSIS — Z6841 Body Mass Index (BMI) 40.0 and over, adult: Secondary | ICD-10-CM | POA: Insufficient documentation

## 2014-04-08 DIAGNOSIS — Z955 Presence of coronary angioplasty implant and graft: Secondary | ICD-10-CM | POA: Diagnosis not present

## 2014-04-08 DIAGNOSIS — K219 Gastro-esophageal reflux disease without esophagitis: Secondary | ICD-10-CM | POA: Diagnosis not present

## 2014-04-08 DIAGNOSIS — Z86718 Personal history of other venous thrombosis and embolism: Secondary | ICD-10-CM | POA: Insufficient documentation

## 2014-04-08 DIAGNOSIS — G4733 Obstructive sleep apnea (adult) (pediatric): Secondary | ICD-10-CM | POA: Insufficient documentation

## 2014-04-08 DIAGNOSIS — Z8673 Personal history of transient ischemic attack (TIA), and cerebral infarction without residual deficits: Secondary | ICD-10-CM | POA: Insufficient documentation

## 2014-04-09 ENCOUNTER — Telehealth: Payer: Self-pay | Admitting: Hematology

## 2014-04-09 ENCOUNTER — Encounter: Payer: Self-pay | Admitting: Hematology

## 2014-04-09 ENCOUNTER — Other Ambulatory Visit (HOSPITAL_BASED_OUTPATIENT_CLINIC_OR_DEPARTMENT_OTHER): Payer: BC Managed Care – PPO

## 2014-04-09 ENCOUNTER — Ambulatory Visit (HOSPITAL_BASED_OUTPATIENT_CLINIC_OR_DEPARTMENT_OTHER): Payer: Self-pay | Admitting: Pharmacist

## 2014-04-09 ENCOUNTER — Ambulatory Visit (HOSPITAL_BASED_OUTPATIENT_CLINIC_OR_DEPARTMENT_OTHER): Payer: BC Managed Care – PPO | Admitting: Hematology

## 2014-04-09 VITALS — BP 131/48 | HR 54 | Temp 97.8°F | Resp 18 | Ht 63.0 in | Wt 266.0 lb

## 2014-04-09 DIAGNOSIS — I10 Essential (primary) hypertension: Secondary | ICD-10-CM | POA: Diagnosis not present

## 2014-04-09 DIAGNOSIS — I82409 Acute embolism and thrombosis of unspecified deep veins of unspecified lower extremity: Secondary | ICD-10-CM

## 2014-04-09 DIAGNOSIS — I82403 Acute embolism and thrombosis of unspecified deep veins of lower extremity, bilateral: Secondary | ICD-10-CM

## 2014-04-09 DIAGNOSIS — I82402 Acute embolism and thrombosis of unspecified deep veins of left lower extremity: Secondary | ICD-10-CM

## 2014-04-09 LAB — CBC WITH DIFFERENTIAL/PLATELET
BASO%: 1 % (ref 0.0–2.0)
BASOS ABS: 0.1 10*3/uL (ref 0.0–0.1)
EOS%: 2.5 % (ref 0.0–7.0)
Eosinophils Absolute: 0.4 10*3/uL (ref 0.0–0.5)
HCT: 38 % (ref 34.8–46.6)
HEMOGLOBIN: 12.2 g/dL (ref 11.6–15.9)
LYMPH%: 49.6 % (ref 14.0–49.7)
MCH: 26.9 pg (ref 25.1–34.0)
MCHC: 32.1 g/dL (ref 31.5–36.0)
MCV: 83.7 fL (ref 79.5–101.0)
MONO#: 0.8 10*3/uL (ref 0.1–0.9)
MONO%: 5.7 % (ref 0.0–14.0)
NEUT#: 5.7 10*3/uL (ref 1.5–6.5)
NEUT%: 41.2 % (ref 38.4–76.8)
Platelets: 254 10*3/uL (ref 145–400)
RBC: 4.54 10*6/uL (ref 3.70–5.45)
RDW: 15.6 % — AB (ref 11.2–14.5)
WBC: 13.8 10*3/uL — ABNORMAL HIGH (ref 3.9–10.3)
lymph#: 6.8 10*3/uL — ABNORMAL HIGH (ref 0.9–3.3)

## 2014-04-09 LAB — POCT INR: INR: 2

## 2014-04-09 LAB — PROTIME-INR
INR: 2 (ref 2.00–3.50)
PROTIME: 24 s — AB (ref 10.6–13.4)

## 2014-04-09 MED ORDER — WARFARIN SODIUM 5 MG PO TABS
ORAL_TABLET | ORAL | Status: DC
Start: 2014-04-09 — End: 2014-08-31

## 2014-04-09 NOTE — Progress Notes (Signed)
[facility] NOTE 01/03/2014  Patient Care Team: Merrilee Seashore, MD as PCP - General (Internal Medicine)  REASON FOR OFFICE VISIT:   Recurrent DVT on Xarelto. Now on Coumadin. Follow up.    HISTORY OF PRESENTING ILLNESS:  1. Left LE DVT after surgery in 2012, treated with short course of coumadin  2. In June of 2014 she complained of pain in her right leg. There had been no specific provoking event leading to this symptom. Evaluation showed a DVT again in the left leg. She was started on rivaroxaban.  3. She presents to the ED ON 12/18/2013 after an episode of near syncope at home. Collapsed to ground but no LOC. No injury with this. Had some upper left-sided headache but this is like her usual headaches. Has h/o DVT x2 in the past, on chronic xarelto, legs were swollen. Bilateral lower extremity venous duplex completed. Bilateral lower extremities are positive for deep vein thrombosis involving bilateral posterior tibial and left peroneal veins. There is no evidence of Baker's cyst bilaterally. CTA without pulmonary embolism. Placed on IV heparin 12.5 ml/hr and Warfarin 7.5 mg/daily (dosing per pharmacy), Xarelto discontinued.   CURRENT THERAPY coumadin.   INTERIM HISTORY She returns for follow up with her daughter. Her INR was 2 this morning. She is on 7.5mg  coumadin daily except Monday and Friday she takes 5mg . Her daughter has some concerns about her not being eating green leave vegetable. She feels well overall, she had heart attack 2 month ago, and she had stent placement. She is on heart rehab, uses walker. No pain, has good appetite. No dyspnea on exertion.   MEDICAL HISTORY:  Past Medical History  Diagnosis Date  . Hypertension   . Hemorrhoid   . GERD (gastroesophageal reflux disease)   . Adenomatous polyps   . DVT (deep venous thrombosis) 2003    lower extremity DVT with questionable recurrence in 2005. Treated with 1.5 year course of  coumadin.  [name] History of TIAs     2004 - no documentation available. 09/2010 -  was not on aspirin or statin therapy prior to event.  . Endometrial thickening on ultra sound 07/2009    path showing Fragments of benign endocervical and squamous mucosa. No dysplasia or malignancy // Followed by Dr. [name]  . Coronary artery disease, non-occlusive 11/2003; 01/2014    LHC (11/2003) - LAD diffuse 30% stenosis of mid-vessel. RCA with 30% stenosis of proximal vessel - by Dr. Dannielle Burn. // 2D Echo (04/2006) -  LV EF 65%.  Mild LVH. No  wall motion abnormalities.  Mild diastolic dysfunction. 2015 with STEMI- stent palced  . Prediabetes DX: 09/2010    HgA1c 6.1  . Stroke   . Syncope 12/18/2013  . Hx of myocardial infarction less than 8 weeks 01/24/14  . Hyperlipidemia LDL goal <70   Sleep apnea SURGICAL HISTORY: Past Surgical History  Procedure Laterality Date  . Tubal ligation    . Knee surgery  1996    Left knee arthroscopy.  . Colonoscopy w/ polypectomy  11/01/2007    8 mm rectal adenoma, hemorrhoids  . Rotator cuff repair      right.  . Coronary angioplasty with stent placement  01/24/14    Inf. STEMI, BMS to RCA  . Left heart catheterization with coronary angiogram N/A 01/24/2014    Procedure: LEFT HEART CATHETERIZATION WITH CORONARY ANGIOGRAM;  Surgeon: Burnell Blanks, MD;  Location: [facility] CATH LAB;  Service: Cardiovascular;  Laterality: N/A;  SOCIAL HISTORY: History   Social History  . Marital Status: Married    Spouse Name: N/A    Number of Children: 6  . Years of Education: College   Occupational History  . Control and instrumentation engineer   . [employer]   Social History Main Topics  . Smoking status: Never Smoker   . Smokeless tobacco: Never Used  . Alcohol Use: No  . Drug Use: No  . Sexual Activity: Not on file   Other Topics Concern  . Not on file   Social History Narrative   Full Code status.   Insurance: BCBS    FAMILY  HISTORY: Family History  Problem Relation Age of Onset  . Thyroid cancer Sister   . Liver cancer Sister   . Diabetes Mother   . Hyperlipidemia Mother   . Heart disease Mother   . Dementia Mother   . Angina Father   . Heart disease Brother     x 2 in 49s yo  . Alzheimer's disease Maternal Grandmother     ALLERGIES:  has No Known Allergies.  MEDICATIONS:  Current Outpatient Prescriptions  Medication Sig Dispense Refill  . acetaminophen (TYLENOL) 500 MG tablet Take 500 mg by mouth every 6 (six) hours as needed for mild pain.     [name] atorvastatin (LIPITOR) 80 MG tablet Take 1 tablet (80 mg total) by mouth daily at 6 PM. 30 tablet 5  . clopidogrel (PLAVIX) 75 MG tablet Take 1 tablet (75 mg total) by mouth daily with breakfast. 30 tablet 11  . lisinopril-hydrochlorothiazide (PRINZIDE,ZESTORETIC) 10-12.5 MG per tablet     . metoprolol succinate (TOPROL-XL) 25 MG 24 hr tablet Take 1 tablet (25 mg total) by mouth daily. Take with or immediately following a meal. 30 tablet 3  . Multiple Vitamin (MULTIVITAMIN WITH MINERALS) TABS Take 1 tablet by mouth every morning.    . nitroGLYCERIN (NITROSTAT) 0.4 MG SL tablet Place 1 tablet (0.4 mg total) under the tongue every 5 (five) minutes x 3 doses as needed for chest pain. 25 tablet 12  . omeprazole (PRILOSEC) 40 MG capsule Take 40 mg by mouth daily.    . traMADol (ULTRAM) 50 MG tablet Take by mouth every 6 (six) hours as needed.    . warfarin (COUMADIN) 5 MG tablet Take 7.5 mg (1.5 tablet) by mouth daily or as instructed by the coumadin clinic 45 tablet 3  . PROMETHAZINE VC PLAIN 6.25-5 MG/5ML SYRP every 6 (six) hours as needed.     No current facility-administered medications for this visit.    REVIEW OF SYSTEMS:   Constitutional: Denies fevers, chills or abnormal night sweats Eyes: Denies blurriness of vision, double vision or watery eyes Ears, nose, mouth, throat, and face: Denies mucositis or sore throat Respiratory: Denies cough, dyspnea or  wheezes Cardiovascular: Denies palpitation, chest discomfort or lower extremity swelling Gastrointestinal:  Denies nausea, heartburn or change in bowel habits Skin: Denies abnormal skin rashes Lymphatics: Denies new lymphadenopathy or easy bruising Neurological:Denies numbness, tingling or new weaknesses Behavioral/Psych: Mood is stable, no new changes  All other systems were reviewed with the patient and are negative.  PHYSICAL EXAMINATION: ECOG PERFORMANCE STATUS: 2  Filed Vitals:   04/09/14 0915  BP: 131/48  Pulse: 54  Temp: 97.8 F (36.6 C)  Resp: 18   Filed Weights   04/09/14 0915  Weight: 266 lb (120.657 kg)    GENERAL:alert, no distress and comfortable, obese+ SKIN: skin color, texture, turgor are normal, no  rashes or significant lesions EYES: normal, conjunctiva are pink and non-injected, sclera clear OROPHARYNX:no exudate, no erythema and lips, buccal mucosa, and tongue normal  NECK: supple, thyroid normal size, non-tender, without nodularity LYMPH:  no palpable lymphadenopathy in the cervical, axillary or inguinal LUNGS: clear to auscultation and percussion with normal breathing effort HEART: regular rate & rhythm and no murmurs and no lower extremity edema ABDOMEN:abdomen soft, non-tender and normal bowel sounds Musculoskeletal:no cyanosis of digits and no clubbing  PSYCH: alert & oriented x 3 with fluent speech NEURO: no focal motor/sensory deficits  LABORATORY DATA:  I have reviewed the data as listed Lab Results  Component Value Date   WBC 13.8* 04/09/2014   HGB 12.2 04/09/2014   HCT 38.0 04/09/2014   MCV 83.7 04/09/2014   PLT 254 04/09/2014    Recent Labs  05/28/13 1925 12/18/13 2234 01/24/14 0051 01/24/14 1029 03/19/14 0745  NA 140 140 134* 135*  --   K 4.1 4.2 4.5 4.2  --   CL 101 103 98 102  --   CO2 26 27 19 23   --   GLUCOSE 113* 114* 146* 124*  --   BUN 17 12 16 12   --   CREATININE 0.64 0.64 0.64 0.59  --   CALCIUM 9.4 8.8 8.9 8.1*   --   GFRNONAA 87* 86* 86* 89*  --   GFRAA >90 >90 >90 >90  --   PROT 7.2 6.6  --   --  6.4  ALBUMIN 3.4* 3.0*  --   --  3.1*  AST 18 18  --   --  19  ALT 23 17  --   --  22  ALKPHOS 114 108  --   --  127*  BILITOT 0.2* 0.2*  --   --  0.5  BILIDIR  --   --   --   --  0.0        RADIOGRAPHIC STUDIES: I have personally reviewed the radiological images as listed and agreed with the findings in the report.  Low extremity Duplex 04/02/2014 - Mild technical difficult due to body habitus. - No evidence of deep vein or superficial thrombosis involving the right lower extremity and left lower extremity. - Previous DVT in the right posterior tibial, left posterior tibial, and left peroneal veins appear to have resolved. - No evidence of Baker&'s cyst on the right or left.  ASSESSMENT/PLAN:  74 year old female   1.Recurrent LE DVT. -although her hemophilia work up was negative, she has multiple risk factors for DVT (multiple history of DVT, obesity, sedentary life style), so we recommend life long anticoagulation. The goal of INR is 2.5-3.0. Patient is now being monitored at Coumadin Clinic here at [facility]. -I spoke with our pharmacist who is managing the coumadin clinic today about her green leave vegetable in her diet. As long as she is on small and fixed amount of vegetable diet, we can adjust her coumadin dose to reach the therapeutic goal. We also discussed that sometimes we used small amount of vitamin K along with Coumadin to stabilize the INR level. -Her repeated ultrasound was negative for DVT.   2. HTN, CAD, history of recent MI and stroke  -She will continue follow-up with her primary care physician and her cardiologist. -Continue medications.  Follow-up: She will follow up with our Coumadin clinic to adjust and monitor her Coumadin dose. I'll see her back in 6 months with lab.  All questions were  answered. The patient knows to call the clinic with any  problems, questions or concerns. I spent 15 minutes counseling the patient face to face. The total time spent in the appointment was 20 minutes.  Shirley [name]  04/09/2014

## 2014-04-09 NOTE — Patient Instructions (Signed)
INR slightly below goal Change Coumadin to 7.5mg  daily except 5 mg on Mondays.  Return 04/23/14 at 9am for lab, 9:15am for coumadin clinic appointment

## 2014-04-09 NOTE — Progress Notes (Signed)
INR slightly below goal today at 2.0 (goal 2.5-3) Pt is doing well with no complaints No unusual bleeding or bruising No missed or extra doses Pt is asking about about greens and would like to eat more iceberg lettuce and green beans. I stated this should be fine as long as she is consistent as these are low in vit k.  We will increase her dose and depending on INR at next visit may need another dose increase as well.  Pt and daughter would like to be conservative with dose increases. Plan:  Change Coumadin to 7.5mg  daily except 5 mg on Mondays.  Return 04/23/14 at 9am for lab, 9:15am for coumadin clinic appointment

## 2014-04-09 NOTE — Telephone Encounter (Signed)
Pt confirmed labs/ov per 01/05 POF, gave pt AVS..... KJ °

## 2014-04-10 ENCOUNTER — Encounter (HOSPITAL_COMMUNITY)
Admission: RE | Admit: 2014-04-10 | Discharge: 2014-04-10 | Disposition: A | Discharge status: Home / Self Care | Payer: BC Managed Care – PPO | Source: Ambulatory Visit | Attending: Cardiovascular Disease | Admitting: Cardiovascular Disease

## 2014-04-10 DIAGNOSIS — Z5189 Encounter for other specified aftercare: Secondary | ICD-10-CM | POA: Diagnosis not present

## 2014-04-10 LAB — D-DIMER, QUANTITATIVE: D-Dimer, Quant: 0.59 ug{FEU}/mL — ABNORMAL HIGH (ref 0.00–0.48)

## 2014-04-12 ENCOUNTER — Encounter (HOSPITAL_COMMUNITY)
Admission: RE | Admit: 2014-04-12 | Discharge: 2014-04-12 | Disposition: A | Discharge status: Home / Self Care | Payer: BC Managed Care – PPO | Source: Ambulatory Visit | Attending: Cardiovascular Disease | Admitting: Cardiovascular Disease

## 2014-04-12 DIAGNOSIS — Z5189 Encounter for other specified aftercare: Secondary | ICD-10-CM | POA: Diagnosis not present

## 2014-04-15 ENCOUNTER — Encounter (HOSPITAL_COMMUNITY)
Admission: RE | Admit: 2014-04-15 | Discharge: 2014-04-15 | Disposition: A | Discharge status: Home / Self Care | Payer: BC Managed Care – PPO | Source: Ambulatory Visit | Attending: Cardiovascular Disease | Admitting: Cardiovascular Disease

## 2014-04-15 ENCOUNTER — Telehealth: Payer: Self-pay | Admitting: Cardiology

## 2014-04-15 DIAGNOSIS — Z5189 Encounter for other specified aftercare: Secondary | ICD-10-CM | POA: Diagnosis not present

## 2014-04-15 NOTE — Telephone Encounter (Signed)
Pt had neck pain while walking now resolved. No changes on rhythm strip per nurse.  She has appt with MD for 05/17/14.  If neck pain continues she will call for appt.  It could easily be MS, she had severe chest pain with MI.

## 2014-04-15 NOTE — Progress Notes (Signed)
Patient reported after exercise that she had some left neck pain while walking the track at cardiac rehab that lasted about a minute. The patient said the discomfort was a 3 on a one to ten scale.  The did not report having any chest pain.  Vital signs were stable this morning. Telemetry rhythm sinus with a first degree heart block. Cecilie Kicks Fnp-C called and notified.  No new order received. Mickel Baas told the patient to notify the office if she has a reoccur ance. Will fax exercise flow sheets to Dr. Camillia Herter  office for review.

## 2014-04-17 ENCOUNTER — Encounter (HOSPITAL_COMMUNITY)
Admission: RE | Admit: 2014-04-17 | Discharge: 2014-04-17 | Disposition: A | Discharge status: Home / Self Care | Payer: BC Managed Care – PPO | Source: Ambulatory Visit | Attending: Cardiovascular Disease | Admitting: Cardiovascular Disease

## 2014-04-17 DIAGNOSIS — Z5189 Encounter for other specified aftercare: Secondary | ICD-10-CM | POA: Diagnosis not present

## 2014-04-19 ENCOUNTER — Encounter (HOSPITAL_COMMUNITY)
Admission: RE | Admit: 2014-04-19 | Discharge: 2014-04-19 | Disposition: A | Discharge status: Home / Self Care | Payer: BC Managed Care – PPO | Source: Ambulatory Visit | Attending: Cardiovascular Disease | Admitting: Cardiovascular Disease

## 2014-04-19 DIAGNOSIS — Z5189 Encounter for other specified aftercare: Secondary | ICD-10-CM | POA: Diagnosis not present

## 2014-04-22 ENCOUNTER — Encounter (HOSPITAL_COMMUNITY): Payer: BC Managed Care – PPO

## 2014-04-23 ENCOUNTER — Ambulatory Visit (HOSPITAL_BASED_OUTPATIENT_CLINIC_OR_DEPARTMENT_OTHER): Payer: BC Managed Care – PPO | Admitting: Pharmacist

## 2014-04-23 ENCOUNTER — Other Ambulatory Visit (HOSPITAL_BASED_OUTPATIENT_CLINIC_OR_DEPARTMENT_OTHER): Payer: BC Managed Care – PPO

## 2014-04-23 DIAGNOSIS — I82409 Acute embolism and thrombosis of unspecified deep veins of unspecified lower extremity: Secondary | ICD-10-CM | POA: Diagnosis not present

## 2014-04-23 LAB — PROTIME-INR
INR: 3 (ref 2.00–3.50)
PROTIME: 36 s — AB (ref 10.6–13.4)

## 2014-04-23 LAB — POCT INR: INR: 3

## 2014-04-23 NOTE — Progress Notes (Signed)
INR at goal No changes to report Pt is doing well with no complaints Cardia rehab is going well No unusual bleeding or bruising No missed or extra doses No medication or diet changes (pt states she has not really changed or veggie/green intake) Plans: Continue Coumadin to 7.5mg  daily except 5 mg on Mondays.  Return 05/14/14 at [place] for lab, 9:15am for coumadin clinic appointment

## 2014-04-23 NOTE — Patient Instructions (Signed)
INR at goal No changes Continue Coumadin to 7.5mg  daily except 5 mg on Mondays.   Return 05/14/14 at [place] for lab, 9:15am for coumadin clinic appointment

## 2014-04-24 ENCOUNTER — Encounter (HOSPITAL_COMMUNITY)
Admission: RE | Admit: 2014-04-24 | Discharge: 2014-04-24 | Disposition: A | Discharge status: Home / Self Care | Payer: BC Managed Care – PPO | Source: Ambulatory Visit | Attending: Cardiovascular Disease | Admitting: Cardiovascular Disease

## 2014-04-24 DIAGNOSIS — Z5189 Encounter for other specified aftercare: Secondary | ICD-10-CM | POA: Diagnosis not present

## 2014-04-26 ENCOUNTER — Encounter (HOSPITAL_COMMUNITY): Payer: BC Managed Care – PPO

## 2014-04-29 ENCOUNTER — Encounter (HOSPITAL_COMMUNITY)
Admission: RE | Admit: 2014-04-29 | Discharge: 2014-04-29 | Disposition: A | Discharge status: Home / Self Care | Payer: BC Managed Care – PPO | Source: Ambulatory Visit | Attending: Cardiovascular Disease | Admitting: Cardiovascular Disease

## 2014-04-29 DIAGNOSIS — Z5189 Encounter for other specified aftercare: Secondary | ICD-10-CM | POA: Diagnosis not present

## 2014-05-01 ENCOUNTER — Other Ambulatory Visit: Payer: Self-pay | Admitting: *Deleted

## 2014-05-01 ENCOUNTER — Encounter (HOSPITAL_COMMUNITY)
Admission: RE | Admit: 2014-05-01 | Discharge: 2014-05-01 | Disposition: A | Discharge status: Home / Self Care | Payer: BC Managed Care – PPO | Source: Ambulatory Visit | Attending: Cardiovascular Disease | Admitting: Cardiovascular Disease

## 2014-05-01 DIAGNOSIS — Z5189 Encounter for other specified aftercare: Secondary | ICD-10-CM | POA: Diagnosis not present

## 2014-05-01 MED ORDER — METOPROLOL SUCCINATE ER 25 MG PO TB24: 25.0000 mg | ORAL_TABLET | Freq: Every day | ORAL | Status: DC

## 2014-05-01 NOTE — Progress Notes (Signed)
Mrs Shirley [name] does not have any more refills on her metoprolol.  Dr Camillia Herter office called and notified.

## 2014-05-03 ENCOUNTER — Encounter (HOSPITAL_COMMUNITY)
Admission: RE | Admit: 2014-05-03 | Discharge: 2014-05-03 | Disposition: A | Discharge status: Home / Self Care | Payer: BC Managed Care – PPO | Source: Ambulatory Visit | Attending: Cardiovascular Disease | Admitting: Cardiovascular Disease

## 2014-05-03 DIAGNOSIS — Z5189 Encounter for other specified aftercare: Secondary | ICD-10-CM | POA: Diagnosis not present

## 2014-05-06 ENCOUNTER — Encounter (HOSPITAL_COMMUNITY)
Admission: RE | Admit: 2014-05-06 | Discharge: 2014-05-06 | Disposition: A | Discharge status: Home / Self Care | Payer: BC Managed Care – PPO | Source: Ambulatory Visit | Attending: Cardiovascular Disease | Admitting: Cardiovascular Disease

## 2014-05-06 DIAGNOSIS — Z5189 Encounter for other specified aftercare: Secondary | ICD-10-CM | POA: Diagnosis not present

## 2014-05-06 DIAGNOSIS — I252 Old myocardial infarction: Secondary | ICD-10-CM | POA: Insufficient documentation

## 2014-05-06 DIAGNOSIS — I4901 Ventricular fibrillation: Secondary | ICD-10-CM | POA: Diagnosis not present

## 2014-05-06 DIAGNOSIS — I2582 Chronic total occlusion of coronary artery: Secondary | ICD-10-CM | POA: Diagnosis not present

## 2014-05-06 DIAGNOSIS — G4733 Obstructive sleep apnea (adult) (pediatric): Secondary | ICD-10-CM | POA: Insufficient documentation

## 2014-05-06 DIAGNOSIS — Z6841 Body Mass Index (BMI) 40.0 and over, adult: Secondary | ICD-10-CM | POA: Insufficient documentation

## 2014-05-06 DIAGNOSIS — I2510 Atherosclerotic heart disease of native coronary artery without angina pectoris: Secondary | ICD-10-CM | POA: Diagnosis not present

## 2014-05-06 DIAGNOSIS — E785 Hyperlipidemia, unspecified: Secondary | ICD-10-CM | POA: Diagnosis not present

## 2014-05-06 DIAGNOSIS — K219 Gastro-esophageal reflux disease without esophagitis: Secondary | ICD-10-CM | POA: Insufficient documentation

## 2014-05-06 DIAGNOSIS — Z7901 Long term (current) use of anticoagulants: Secondary | ICD-10-CM | POA: Diagnosis not present

## 2014-05-06 DIAGNOSIS — Z955 Presence of coronary angioplasty implant and graft: Secondary | ICD-10-CM | POA: Diagnosis not present

## 2014-05-06 DIAGNOSIS — Z8249 Family history of ischemic heart disease and other diseases of the circulatory system: Secondary | ICD-10-CM | POA: Diagnosis not present

## 2014-05-06 DIAGNOSIS — Z8673 Personal history of transient ischemic attack (TIA), and cerebral infarction without residual deficits: Secondary | ICD-10-CM | POA: Diagnosis not present

## 2014-05-06 DIAGNOSIS — Z86718 Personal history of other venous thrombosis and embolism: Secondary | ICD-10-CM | POA: Insufficient documentation

## 2014-05-06 DIAGNOSIS — I10 Essential (primary) hypertension: Secondary | ICD-10-CM | POA: Insufficient documentation

## 2014-05-08 ENCOUNTER — Encounter (HOSPITAL_COMMUNITY)
Admission: RE | Admit: 2014-05-08 | Discharge: 2014-05-08 | Disposition: A | Discharge status: Home / Self Care | Payer: BC Managed Care – PPO | Source: Ambulatory Visit | Attending: Cardiovascular Disease | Admitting: Cardiovascular Disease

## 2014-05-08 DIAGNOSIS — I4901 Ventricular fibrillation: Secondary | ICD-10-CM | POA: Diagnosis not present

## 2014-05-08 DIAGNOSIS — Z955 Presence of coronary angioplasty implant and graft: Secondary | ICD-10-CM | POA: Diagnosis not present

## 2014-05-08 DIAGNOSIS — I2582 Chronic total occlusion of coronary artery: Secondary | ICD-10-CM | POA: Diagnosis not present

## 2014-05-08 DIAGNOSIS — Z5189 Encounter for other specified aftercare: Secondary | ICD-10-CM | POA: Diagnosis not present

## 2014-05-08 DIAGNOSIS — I252 Old myocardial infarction: Secondary | ICD-10-CM | POA: Diagnosis not present

## 2014-05-10 ENCOUNTER — Encounter (HOSPITAL_COMMUNITY)
Admission: RE | Admit: 2014-05-10 | Discharge: 2014-05-10 | Disposition: A | Discharge status: Home / Self Care | Payer: BC Managed Care – PPO | Source: Ambulatory Visit | Attending: Cardiovascular Disease | Admitting: Cardiovascular Disease

## 2014-05-10 DIAGNOSIS — Z955 Presence of coronary angioplasty implant and graft: Secondary | ICD-10-CM | POA: Diagnosis not present

## 2014-05-10 DIAGNOSIS — I2582 Chronic total occlusion of coronary artery: Secondary | ICD-10-CM | POA: Diagnosis not present

## 2014-05-10 DIAGNOSIS — I252 Old myocardial infarction: Secondary | ICD-10-CM | POA: Diagnosis not present

## 2014-05-10 DIAGNOSIS — I4901 Ventricular fibrillation: Secondary | ICD-10-CM | POA: Diagnosis not present

## 2014-05-10 DIAGNOSIS — Z5189 Encounter for other specified aftercare: Secondary | ICD-10-CM | POA: Diagnosis not present

## 2014-05-13 ENCOUNTER — Encounter (HOSPITAL_COMMUNITY)
Admission: RE | Admit: 2014-05-13 | Discharge: 2014-05-13 | Disposition: A | Discharge status: Home / Self Care | Payer: BC Managed Care – PPO | Source: Ambulatory Visit | Attending: Cardiovascular Disease | Admitting: Cardiovascular Disease

## 2014-05-13 DIAGNOSIS — Z955 Presence of coronary angioplasty implant and graft: Secondary | ICD-10-CM | POA: Diagnosis not present

## 2014-05-13 DIAGNOSIS — I4901 Ventricular fibrillation: Secondary | ICD-10-CM | POA: Diagnosis not present

## 2014-05-13 DIAGNOSIS — I2582 Chronic total occlusion of coronary artery: Secondary | ICD-10-CM | POA: Diagnosis not present

## 2014-05-13 DIAGNOSIS — I252 Old myocardial infarction: Secondary | ICD-10-CM | POA: Diagnosis not present

## 2014-05-13 DIAGNOSIS — Z5189 Encounter for other specified aftercare: Secondary | ICD-10-CM | POA: Diagnosis not present

## 2014-05-14 ENCOUNTER — Ambulatory Visit (HOSPITAL_BASED_OUTPATIENT_CLINIC_OR_DEPARTMENT_OTHER): Payer: BC Managed Care – PPO | Admitting: Pharmacist

## 2014-05-14 ENCOUNTER — Other Ambulatory Visit (HOSPITAL_BASED_OUTPATIENT_CLINIC_OR_DEPARTMENT_OTHER): Payer: BC Managed Care – PPO

## 2014-05-14 DIAGNOSIS — I82409 Acute embolism and thrombosis of unspecified deep veins of unspecified lower extremity: Secondary | ICD-10-CM

## 2014-05-14 LAB — POCT INR: INR: 3.1

## 2014-05-14 LAB — PROTIME-INR
INR: 3.1 (ref 2.00–3.50)
PROTIME: 37.2 s — AB (ref 10.6–13.4)

## 2014-05-14 NOTE — Progress Notes (Signed)
INR = 3.1     Goal 2.5-3 INR slightly above goal range. No complications of anticoagulation noted. She continues cardiac rehab Mon,Wed, Fri. No medication or dietary changes. She will continue Coumadin to 7.5mg  daily except 5 mg on Mondays. Return 06/04/14 at [place] for lab, 9:15am for Coumadin clinic appointment. She is aware that she can call us if she needs to see [place] sooner. She states she may call and change appt dates if she returns to work before 3/1.  [employer], Pharm D

## 2014-05-15 ENCOUNTER — Encounter (HOSPITAL_COMMUNITY)
Admission: RE | Admit: 2014-05-15 | Discharge: 2014-05-15 | Disposition: A | Discharge status: Home / Self Care | Payer: BC Managed Care – PPO | Source: Ambulatory Visit | Attending: Cardiovascular Disease | Admitting: Cardiovascular Disease

## 2014-05-15 DIAGNOSIS — I252 Old myocardial infarction: Secondary | ICD-10-CM | POA: Diagnosis not present

## 2014-05-15 DIAGNOSIS — Z955 Presence of coronary angioplasty implant and graft: Secondary | ICD-10-CM | POA: Diagnosis not present

## 2014-05-15 DIAGNOSIS — I4901 Ventricular fibrillation: Secondary | ICD-10-CM | POA: Diagnosis not present

## 2014-05-15 DIAGNOSIS — Z5189 Encounter for other specified aftercare: Secondary | ICD-10-CM | POA: Diagnosis not present

## 2014-05-15 DIAGNOSIS — I2582 Chronic total occlusion of coronary artery: Secondary | ICD-10-CM | POA: Diagnosis not present

## 2014-05-17 ENCOUNTER — Encounter (HOSPITAL_COMMUNITY)
Admission: RE | Admit: 2014-05-17 | Discharge: 2014-05-17 | Disposition: A | Discharge status: Home / Self Care | Payer: BC Managed Care – PPO | Source: Ambulatory Visit | Attending: Cardiovascular Disease | Admitting: Cardiovascular Disease

## 2014-05-17 ENCOUNTER — Ambulatory Visit: Payer: BC Managed Care – PPO | Admitting: Cardiovascular Disease

## 2014-05-17 DIAGNOSIS — I252 Old myocardial infarction: Secondary | ICD-10-CM | POA: Diagnosis not present

## 2014-05-17 DIAGNOSIS — Z5189 Encounter for other specified aftercare: Secondary | ICD-10-CM | POA: Diagnosis not present

## 2014-05-17 DIAGNOSIS — Z955 Presence of coronary angioplasty implant and graft: Secondary | ICD-10-CM | POA: Diagnosis not present

## 2014-05-17 DIAGNOSIS — I4901 Ventricular fibrillation: Secondary | ICD-10-CM | POA: Diagnosis not present

## 2014-05-17 DIAGNOSIS — I2582 Chronic total occlusion of coronary artery: Secondary | ICD-10-CM | POA: Diagnosis not present

## 2014-05-20 ENCOUNTER — Encounter (HOSPITAL_COMMUNITY): Payer: BC Managed Care – PPO

## 2014-05-21 ENCOUNTER — Encounter: Payer: Self-pay | Admitting: *Deleted

## 2014-05-21 ENCOUNTER — Encounter: Payer: Self-pay | Admitting: Cardiovascular Disease

## 2014-05-21 ENCOUNTER — Ambulatory Visit (INDEPENDENT_AMBULATORY_CARE_PROVIDER_SITE_OTHER): Payer: BC Managed Care – PPO | Admitting: Cardiovascular Disease

## 2014-05-21 VITALS — BP 114/62 | HR 61 | Ht 64.0 in | Wt 261.6 lb

## 2014-05-21 DIAGNOSIS — E785 Hyperlipidemia, unspecified: Secondary | ICD-10-CM | POA: Diagnosis not present

## 2014-05-21 DIAGNOSIS — I10 Essential (primary) hypertension: Secondary | ICD-10-CM | POA: Diagnosis not present

## 2014-05-21 DIAGNOSIS — I82403 Acute embolism and thrombosis of unspecified deep veins of lower extremity, bilateral: Secondary | ICD-10-CM | POA: Diagnosis not present

## 2014-05-21 DIAGNOSIS — I2510 Atherosclerotic heart disease of native coronary artery without angina pectoris: Secondary | ICD-10-CM | POA: Diagnosis not present

## 2014-05-21 MED ORDER — FAMOTIDINE 10 MG PO TABS: ORAL_TABLET | ORAL | Status: DC

## 2014-05-21 NOTE — Patient Instructions (Signed)
Your physician wants you to follow-up in:  6 months.  You will receive a reminder letter in the mail two months in advance. If you don't receive a letter, please call our office to schedule the follow-up appointment.  Your physician has recommended you make the following change in your medication:  Stop Omeprazole (this can interfere with Clopidogrel). May use over the counter Pepcid for reflux.  Follow label instructions.

## 2014-05-21 NOTE — Progress Notes (Signed)
History of Present Illness: 74 yo female with history of CAD with inferior STEMI October 2015, HTN, DVT here today for cardiac follow up. I met her on 01/30/14 when she presented with an inferior STEMI. A bare metal stent was placed in the sub-totally occluded proximal RCA. She is lifelong coumadin therapy for recurrent DVT. Of note, her presentation with MI in October 1062 was complicated by ventricular fibrillation arrest in the ED with successful defibrillation and CPR. Her Circumflex and LAD had no disease. LVEF was preserved.   She is here today for follow up. She is feeling well. No chest pain or SOB. She has been doing well at cardiac rehab. No LE edema. Wanting to go back to work.   Primary Care Physician: Ashby Dawes  Last Lipid Profile:Lipid Panel     Component Value Date/Time   CHOL 107 03/19/2014 0745   TRIG 110.0 03/19/2014 0745   HDL 24.40* 03/19/2014 0745   CHOLHDL 4 03/19/2014 0745   VLDL 22.0 03/19/2014 0745   LDLCALC 61 03/19/2014 0745     Past Medical History  Diagnosis Date  . Hypertension   . Hemorrhoid   . GERD (gastroesophageal reflux disease)   . Adenomatous polyps   . DVT (deep venous thrombosis) 2003    lower extremity DVT with questionable recurrence in 2005. Treated with 1.5 year course of coumadin.  [name] History of TIAs     2004 - no documentation available. 09/2010 -  was not on aspirin or statin therapy prior to event.  . Endometrial thickening on ultra sound 07/2009    path showing Fragments of benign endocervical and squamous mucosa. No dysplasia or malignancy // Followed by Dr. [name]  . Coronary artery disease, non-occlusive 11/2003; 01/2014    LHC (11/2003) - LAD diffuse 30% stenosis of mid-vessel. RCA with 30% stenosis of proximal vessel - by Dr. Dannielle Burn. // 2D Echo (04/2006) -  LV EF 65%.  Mild LVH. No  wall motion abnormalities.  Mild diastolic dysfunction. 2015 with STEMI- stent palced  . Prediabetes DX: 09/2010    HgA1c 6.1  .  Stroke   . Syncope 12/18/2013  . Hx of myocardial infarction less than 8 weeks 01/24/14  . Hyperlipidemia LDL goal <70     Past Surgical History  Procedure Laterality Date  . Tubal ligation    . Knee surgery  1996    Left knee arthroscopy.  . Colonoscopy w/ polypectomy  11/01/2007    8 mm rectal adenoma, hemorrhoids  . Rotator cuff repair      right.  . Coronary angioplasty with stent placement  01/24/14    Inf. STEMI, BMS to RCA  . Left heart catheterization with coronary angiogram N/A 01/24/2014    Procedure: LEFT HEART CATHETERIZATION WITH CORONARY ANGIOGRAM;  Surgeon: Burnell Blanks, MD;  Location: [facility] CATH LAB;  Service: Cardiovascular;  Laterality: N/A;    Current Outpatient Prescriptions  Medication Sig Dispense Refill  . acetaminophen (TYLENOL) 500 MG tablet Take 500 mg by mouth every 6 (six) hours as needed for mild pain.     [name] atorvastatin (LIPITOR) 80 MG tablet Take 1 tablet (80 mg total) by mouth daily at 6 PM. 30 tablet 5  . clopidogrel (PLAVIX) 75 MG tablet Take 1 tablet (75 mg total) by mouth daily with breakfast. 30 tablet 11  . lisinopril-hydrochlorothiazide (PRINZIDE,ZESTORETIC) 10-12.5 MG per tablet     . metoprolol succinate (TOPROL-XL) 25 MG 24 hr tablet Take 1 tablet (25 mg total)  by mouth daily. Take with or immediately following a meal. 30 tablet 0  . Multiple Vitamin (MULTIVITAMIN WITH MINERALS) TABS Take 1 tablet by mouth every morning.    . nitroGLYCERIN (NITROSTAT) 0.4 MG SL tablet Place 1 tablet (0.4 mg total) under the tongue every 5 (five) minutes x 3 doses as needed for chest pain. 25 tablet 12  . omeprazole (PRILOSEC) 40 MG capsule Take 40 mg by mouth daily.    [name] PROMETHAZINE VC PLAIN 6.25-5 MG/5ML SYRP every 6 (six) hours as needed.    . warfarin (COUMADIN) 5 MG tablet Take 7.5 mg (1.5 tablet) by mouth daily or as instructed by the coumadin clinic 45 tablet 3   No current facility-administered medications for this visit.    No Known  Allergies  History   Social History  . Marital Status: Married    Spouse Name: N/A  . Number of Children: 6  . Years of Education: College   Occupational History  . Control and instrumentation engineer   . [employer]   Social History Main Topics  . Smoking status: Never Smoker   . Smokeless tobacco: Never Used  . Alcohol Use: No  . Drug Use: No  . Sexual Activity: Not on file   Other Topics Concern  . Not on file   Social History Narrative   Full Code status.   Insurance: BCBS    Family History  Problem Relation Age of Onset  . Thyroid cancer Sister   . Liver cancer Sister   . Diabetes Mother   . Hyperlipidemia Mother   . Heart disease Mother   . Dementia Mother   . Angina Father   . Heart disease Brother     x 2 in 54s yo  . Alzheimer's disease Maternal Grandmother     Review of Systems:  As stated in the HPI and otherwise negative.   BP 114/62 mmHg  Pulse 61  Ht '5\' 4"'  (1.626 m)  Wt 261 lb 9.6 oz (118.661 kg)  BMI 44.88 kg/m2  SpO2 97%  Physical Examination: General: Well developed, well nourished, NAD HEENT: OP clear, mucus membranes moist SKIN: warm, dry. No rashes. Neuro: No focal deficits Musculoskeletal: Muscle strength 5/5 all ext Psychiatric: Mood and affect normal Neck: No JVD, no carotid bruits, no thyromegaly, no lymphadenopathy. Lungs:Clear bilaterally, no wheezes, rhonci, crackles Cardiovascular: Regular rate and rhythm. No murmurs, gallops or rubs. Abdomen:Soft. Bowel sounds present. Non-tender.  Extremities: No lower extremity edema. Pulses are 2 + in the bilateral DP/PT.  Echo 01/24/14:  Left ventricle: The cavity size was normal. There was mild concentric hypertrophy. Systolic function was normal. The estimated ejection fraction was in the range of 55% to 60%. Wall motion was normal; there were no regional wall motion abnormalities. - Aortic valve: There was trivial regurgitation. - Ascending aorta:  The ascending aorta was mildly dilated. - Mitral valve: Calcified annulus.  Cardiac cath 01/24/14: Left main: No obstructive disease.  Left Anterior Descending Artery: Large caliber vessel that courses to the apex. Moderate caliber diagonal branch. No obstructive disease.  Circumflex Artery: Large caliber vessel with large obtuse marginal branch. No obstructive disease.  Right Coronary Artery: Moderate caliber co-dominant vessel with sub-totally occluded proximal vessel.  Left Ventricular Angiogram: LVEF=50%  Assessment and Plan:   1. CAD: Stable. Continue Plavix, beta blocker, statin. No ASA since she is on coumadin.   2. HTN: BP controlled. No changes  3. DVT: Lifelong coumadin therapy.   4. HLD: continue  statin. Lipids well controlled.

## 2014-05-22 ENCOUNTER — Encounter (HOSPITAL_COMMUNITY)
Admission: RE | Admit: 2014-05-22 | Discharge: 2014-05-22 | Disposition: A | Discharge status: Home / Self Care | Payer: BC Managed Care – PPO | Source: Ambulatory Visit | Attending: Cardiovascular Disease | Admitting: Cardiovascular Disease

## 2014-05-22 DIAGNOSIS — Z5189 Encounter for other specified aftercare: Secondary | ICD-10-CM | POA: Diagnosis not present

## 2014-05-22 DIAGNOSIS — I4901 Ventricular fibrillation: Secondary | ICD-10-CM | POA: Diagnosis not present

## 2014-05-22 DIAGNOSIS — I252 Old myocardial infarction: Secondary | ICD-10-CM | POA: Diagnosis not present

## 2014-05-22 DIAGNOSIS — I2582 Chronic total occlusion of coronary artery: Secondary | ICD-10-CM | POA: Diagnosis not present

## 2014-05-22 DIAGNOSIS — Z955 Presence of coronary angioplasty implant and graft: Secondary | ICD-10-CM | POA: Diagnosis not present

## 2014-05-24 ENCOUNTER — Encounter (HOSPITAL_COMMUNITY)
Admission: RE | Admit: 2014-05-24 | Discharge: 2014-05-24 | Disposition: A | Discharge status: Home / Self Care | Payer: BC Managed Care – PPO | Source: Ambulatory Visit | Attending: Cardiovascular Disease | Admitting: Cardiovascular Disease

## 2014-05-24 DIAGNOSIS — Z955 Presence of coronary angioplasty implant and graft: Secondary | ICD-10-CM | POA: Diagnosis not present

## 2014-05-24 DIAGNOSIS — I2582 Chronic total occlusion of coronary artery: Secondary | ICD-10-CM | POA: Diagnosis not present

## 2014-05-24 DIAGNOSIS — Z5189 Encounter for other specified aftercare: Secondary | ICD-10-CM | POA: Diagnosis not present

## 2014-05-24 DIAGNOSIS — I4901 Ventricular fibrillation: Secondary | ICD-10-CM | POA: Diagnosis not present

## 2014-05-24 DIAGNOSIS — I252 Old myocardial infarction: Secondary | ICD-10-CM | POA: Diagnosis not present

## 2014-05-27 ENCOUNTER — Other Ambulatory Visit: Payer: Self-pay | Admitting: Cardiovascular Disease

## 2014-05-27 ENCOUNTER — Encounter (HOSPITAL_COMMUNITY): Payer: BC Managed Care – PPO

## 2014-05-27 ENCOUNTER — Telehealth: Payer: Self-pay | Admitting: Cardiovascular Disease

## 2014-05-27 ENCOUNTER — Encounter (HOSPITAL_COMMUNITY)
Admission: RE | Admit: 2014-05-27 | Discharge: 2014-05-27 | Disposition: A | Discharge status: Home / Self Care | Payer: BC Managed Care – PPO | Source: Ambulatory Visit | Attending: Cardiovascular Disease | Admitting: Cardiovascular Disease

## 2014-05-27 DIAGNOSIS — I2582 Chronic total occlusion of coronary artery: Secondary | ICD-10-CM | POA: Diagnosis not present

## 2014-05-27 DIAGNOSIS — I4901 Ventricular fibrillation: Secondary | ICD-10-CM | POA: Diagnosis not present

## 2014-05-27 DIAGNOSIS — I252 Old myocardial infarction: Secondary | ICD-10-CM | POA: Diagnosis not present

## 2014-05-27 DIAGNOSIS — Z955 Presence of coronary angioplasty implant and graft: Secondary | ICD-10-CM | POA: Diagnosis not present

## 2014-05-27 DIAGNOSIS — Z5189 Encounter for other specified aftercare: Secondary | ICD-10-CM | POA: Diagnosis not present

## 2014-05-27 NOTE — Telephone Encounter (Signed)
New message      Refill metoprolol to [facility] at [facility]

## 2014-05-29 ENCOUNTER — Encounter (HOSPITAL_COMMUNITY): Payer: BC Managed Care – PPO

## 2014-05-29 ENCOUNTER — Encounter (HOSPITAL_COMMUNITY)
Admission: RE | Admit: 2014-05-29 | Discharge: 2014-05-29 | Disposition: A | Discharge status: Home / Self Care | Payer: BC Managed Care – PPO | Source: Ambulatory Visit | Attending: Cardiovascular Disease | Admitting: Cardiovascular Disease

## 2014-05-31 ENCOUNTER — Encounter (HOSPITAL_COMMUNITY)
Admission: RE | Admit: 2014-05-31 | Discharge: 2014-05-31 | Disposition: A | Discharge status: Home / Self Care | Payer: BC Managed Care – PPO | Source: Ambulatory Visit | Attending: Cardiovascular Disease | Admitting: Cardiovascular Disease

## 2014-05-31 ENCOUNTER — Telehealth: Payer: Self-pay | Admitting: Pharmacist

## 2014-05-31 ENCOUNTER — Encounter (HOSPITAL_COMMUNITY): Payer: BC Managed Care – PPO

## 2014-05-31 NOTE — Telephone Encounter (Signed)
Pt called Shirley [name] and needs later appt on 06/04/14- request to sched for 3:30 pm for lab; 3:45 pm for Coumadin clinic. Kennith Center, Pharm.D., CPP 05/31/2014@4 :33 PM

## 2014-06-03 ENCOUNTER — Encounter (HOSPITAL_COMMUNITY): Payer: BC Managed Care – PPO

## 2014-06-03 ENCOUNTER — Encounter (HOSPITAL_COMMUNITY)
Admission: RE | Admit: 2014-06-03 | Discharge: 2014-06-03 | Disposition: A | Discharge status: Home / Self Care | Payer: BC Managed Care – PPO | Source: Ambulatory Visit | Attending: Cardiovascular Disease | Admitting: Cardiovascular Disease

## 2014-06-03 DIAGNOSIS — Z5189 Encounter for other specified aftercare: Secondary | ICD-10-CM | POA: Diagnosis not present

## 2014-06-03 DIAGNOSIS — I2582 Chronic total occlusion of coronary artery: Secondary | ICD-10-CM | POA: Diagnosis not present

## 2014-06-03 DIAGNOSIS — Z955 Presence of coronary angioplasty implant and graft: Secondary | ICD-10-CM | POA: Diagnosis not present

## 2014-06-03 DIAGNOSIS — I252 Old myocardial infarction: Secondary | ICD-10-CM | POA: Diagnosis not present

## 2014-06-03 DIAGNOSIS — I4901 Ventricular fibrillation: Secondary | ICD-10-CM | POA: Diagnosis not present

## 2014-06-04 ENCOUNTER — Telehealth: Payer: Self-pay | Admitting: Pharmacist

## 2014-06-04 ENCOUNTER — Ambulatory Visit: Payer: BC Managed Care – PPO

## 2014-06-04 ENCOUNTER — Other Ambulatory Visit: Payer: BC Managed Care – PPO

## 2014-06-04 NOTE — Telephone Encounter (Signed)
Pt FTKA today for lab and coumadin clinic. I called Caren Griffins, patient's daughter, and left a VM on her cell phone to call back and reschedule missed appointment.  Thank you,  Montel Clock, PharmD, BCOP

## 2014-06-05 ENCOUNTER — Encounter (HOSPITAL_COMMUNITY): Payer: BC Managed Care – PPO

## 2014-06-05 ENCOUNTER — Encounter (HOSPITAL_COMMUNITY)
Admission: RE | Admit: 2014-06-05 | Discharge: 2014-06-05 | Disposition: A | Discharge status: Home / Self Care | Payer: BC Managed Care – PPO | Source: Ambulatory Visit | Attending: Cardiovascular Disease | Admitting: Cardiovascular Disease

## 2014-06-05 DIAGNOSIS — Z955 Presence of coronary angioplasty implant and graft: Secondary | ICD-10-CM | POA: Insufficient documentation

## 2014-06-05 DIAGNOSIS — Z6841 Body Mass Index (BMI) 40.0 and over, adult: Secondary | ICD-10-CM | POA: Insufficient documentation

## 2014-06-05 DIAGNOSIS — I2582 Chronic total occlusion of coronary artery: Secondary | ICD-10-CM | POA: Insufficient documentation

## 2014-06-05 DIAGNOSIS — G4733 Obstructive sleep apnea (adult) (pediatric): Secondary | ICD-10-CM | POA: Diagnosis not present

## 2014-06-05 DIAGNOSIS — Z8249 Family history of ischemic heart disease and other diseases of the circulatory system: Secondary | ICD-10-CM | POA: Diagnosis not present

## 2014-06-05 DIAGNOSIS — E785 Hyperlipidemia, unspecified: Secondary | ICD-10-CM | POA: Diagnosis not present

## 2014-06-05 DIAGNOSIS — I252 Old myocardial infarction: Secondary | ICD-10-CM | POA: Diagnosis not present

## 2014-06-05 DIAGNOSIS — K219 Gastro-esophageal reflux disease without esophagitis: Secondary | ICD-10-CM | POA: Diagnosis not present

## 2014-06-05 DIAGNOSIS — Z8673 Personal history of transient ischemic attack (TIA), and cerebral infarction without residual deficits: Secondary | ICD-10-CM | POA: Insufficient documentation

## 2014-06-05 DIAGNOSIS — Z86718 Personal history of other venous thrombosis and embolism: Secondary | ICD-10-CM | POA: Insufficient documentation

## 2014-06-05 DIAGNOSIS — I2510 Atherosclerotic heart disease of native coronary artery without angina pectoris: Secondary | ICD-10-CM | POA: Insufficient documentation

## 2014-06-05 DIAGNOSIS — Z7901 Long term (current) use of anticoagulants: Secondary | ICD-10-CM | POA: Diagnosis not present

## 2014-06-05 DIAGNOSIS — I10 Essential (primary) hypertension: Secondary | ICD-10-CM | POA: Insufficient documentation

## 2014-06-05 DIAGNOSIS — Z5189 Encounter for other specified aftercare: Secondary | ICD-10-CM | POA: Diagnosis not present

## 2014-06-05 DIAGNOSIS — I4901 Ventricular fibrillation: Secondary | ICD-10-CM | POA: Diagnosis not present

## 2014-06-06 ENCOUNTER — Ambulatory Visit: Payer: BC Managed Care – PPO | Admitting: Pharmacist

## 2014-06-06 ENCOUNTER — Other Ambulatory Visit (HOSPITAL_BASED_OUTPATIENT_CLINIC_OR_DEPARTMENT_OTHER): Payer: BC Managed Care – PPO

## 2014-06-06 DIAGNOSIS — I82409 Acute embolism and thrombosis of unspecified deep veins of unspecified lower extremity: Secondary | ICD-10-CM

## 2014-06-06 LAB — PROTIME-INR
INR: 1.5 — ABNORMAL LOW (ref 2.00–3.50)
Protime: 18 Seconds — ABNORMAL HIGH (ref 10.6–13.4)

## 2014-06-06 LAB — POCT INR: INR: 1.5

## 2014-06-06 NOTE — Progress Notes (Unsigned)
INR below goal today.  No changes in medication.  No bruising.  Delorus states she has some blood in tissue when blows nose but not significant epistaxis.  Unsure why INR is low today.  Will take coumadin 10mg  today and tomorrow then resume previous dose of 7.5mg  daily but 5mg  on Monday.  Will check PT/INR next week to ensure INR back in goal range.

## 2014-06-07 ENCOUNTER — Telehealth: Payer: Self-pay | Admitting: Pharmacist

## 2014-06-07 ENCOUNTER — Encounter (HOSPITAL_COMMUNITY)
Admission: RE | Admit: 2014-06-07 | Discharge: 2014-06-07 | Disposition: A | Discharge status: Home / Self Care | Payer: BC Managed Care – PPO | Source: Ambulatory Visit | Attending: Cardiovascular Disease | Admitting: Cardiovascular Disease

## 2014-06-07 ENCOUNTER — Telehealth: Payer: Self-pay | Admitting: Cardiovascular Disease

## 2014-06-07 ENCOUNTER — Encounter (HOSPITAL_COMMUNITY): Payer: BC Managed Care – PPO

## 2014-06-07 DIAGNOSIS — Z955 Presence of coronary angioplasty implant and graft: Secondary | ICD-10-CM | POA: Diagnosis not present

## 2014-06-07 DIAGNOSIS — Z5189 Encounter for other specified aftercare: Secondary | ICD-10-CM | POA: Diagnosis not present

## 2014-06-07 DIAGNOSIS — I2582 Chronic total occlusion of coronary artery: Secondary | ICD-10-CM | POA: Diagnosis not present

## 2014-06-07 DIAGNOSIS — I4901 Ventricular fibrillation: Secondary | ICD-10-CM | POA: Diagnosis not present

## 2014-06-07 DIAGNOSIS — I252 Old myocardial infarction: Secondary | ICD-10-CM | POA: Diagnosis not present

## 2014-06-07 NOTE — Telephone Encounter (Signed)
Pts dtr, Philemon Kingdom called requesting we fax her mother's AVS to [phone] due to her mother misplacing her AVS after her visit yesterday. Faxed as requested. Kennith Center, Pharm.D., CPP 06/07/2014@8 :59 AM

## 2014-06-07 NOTE — Telephone Encounter (Signed)
Patient's employer has misplaced the RTW note from 05/27/14 with no work restrictions. Patient/daughter requesting [place] to fax to them again for their work file. Completed.  Faxed to [employer] @ [phone].

## 2014-06-07 NOTE — Telephone Encounter (Signed)
New Msg      Pt daughter calling.  Pt employer has lost pt return to work documents.   Copies are needed for pt to continue working, please fax to employer at [phone].  Daughter Caren Griffins would also like a call back.

## 2014-06-10 ENCOUNTER — Encounter (HOSPITAL_COMMUNITY): Payer: BC Managed Care – PPO

## 2014-06-10 ENCOUNTER — Encounter (HOSPITAL_COMMUNITY)
Admission: RE | Admit: 2014-06-10 | Discharge: 2014-06-10 | Disposition: A | Discharge status: Home / Self Care | Payer: BC Managed Care – PPO | Source: Ambulatory Visit | Attending: Cardiovascular Disease | Admitting: Cardiovascular Disease

## 2014-06-10 DIAGNOSIS — I252 Old myocardial infarction: Secondary | ICD-10-CM | POA: Diagnosis not present

## 2014-06-10 DIAGNOSIS — I4901 Ventricular fibrillation: Secondary | ICD-10-CM | POA: Diagnosis not present

## 2014-06-10 DIAGNOSIS — Z955 Presence of coronary angioplasty implant and graft: Secondary | ICD-10-CM | POA: Diagnosis not present

## 2014-06-10 DIAGNOSIS — I2582 Chronic total occlusion of coronary artery: Secondary | ICD-10-CM | POA: Diagnosis not present

## 2014-06-10 DIAGNOSIS — Z5189 Encounter for other specified aftercare: Secondary | ICD-10-CM | POA: Diagnosis not present

## 2014-06-10 NOTE — Progress Notes (Signed)
Shirley [name] Gest 74 y.o. female Nutrition Note Spoke with pt.  Nutrition Plan and Nutrition Survey goals reviewed with pt. Pt is following Step 2 of the Therapeutic Lifestyle Changes diet. Pt wants to lose wt. Pt has been trying to lose wt. Pt has lost 44 lb over the past year. Pt is aware that she is at risk for developing diabetes and denies diabetes dx at this time. Last A1c 6.5. Pre-diabetes/DM discussed.  Pt expressed understanding of the information reviewed. Pt aware of nutrition education classes offered. Lab Results  Component Value Date   HGBA1C 6.5* 12/20/2013    Vitals - 1 value per visit 05/21/2014 04/09/2014 03/14/2014 02/14/2014  Weight (lb) 261.6 266 264.77 264   Vitals - 1 value per visit 02/01/2014 01/27/2014 01/24/2014 01/03/2014  Weight (lb) 260.8 275.7  270.4   Vitals - 1 value per visit 12/22/2013 12/19/2013 12/18/2013 12/18/2013 10/10/2013  Weight (lb) 268.5    284.6   Vitals - 1 value per visit 07/11/2013 05/28/2013 05/28/2013 03/21/2013  Weight (lb) 297.5  307 309.8   Nutrition Diagnosis ? Food-and nutrition-related knowledge deficit related to lack of exposure to information as related to diagnosis of: ? CVD ? DM (A1c 6.5) ? Obesity related to excessive energy intake as evidenced by a BMI of 46.9  Nutrition RX/ Estimated Daily Nutrition Needs for: wt loss  1350-1850 Kcal, 35-50 gm fat, 9-14 gm sat fat, 1.3-1.9 gm trans-fat, <1500 mg sodium, 175-250 gm CHO   Nutrition Intervention ? Pt's individual nutrition plan reviewed with pt. ? Benefits of adopting Therapeutic Lifestyle Changes discussed when Medficts reviewed. ? Pt to attend the Portion Distortion class ? Pt to attend the  ? Nutrition I class                     ? Nutrition II class        ? Diabetes Blitz class       ? Diabetes Q & A class ? Pt given handouts for: ? Nutrition I class ? Nutrition II class ? Diabetes Blitz class ? Continue client-centered nutrition education by RD, as part of interdisciplinary  care. Goal(s) ? Pt to identify food quantities necessary to achieve: ? wt loss to a goal wt of 240-258 lb (109.2-117.4 kg) at graduation from cardiac rehab.  ? Pt able to name foods that affect blood glucose  Monitor and Evaluate progress toward nutrition goal with team. Nutrition Risk: Change to Moderate Derek Mound, M.Ed, RD, LDN, CDE 06/10/2014 3:36 PM

## 2014-06-12 ENCOUNTER — Encounter (HOSPITAL_COMMUNITY): Payer: BC Managed Care – PPO

## 2014-06-12 ENCOUNTER — Encounter (HOSPITAL_COMMUNITY)
Admission: RE | Admit: 2014-06-12 | Discharge: 2014-06-12 | Disposition: A | Discharge status: Home / Self Care | Payer: BC Managed Care – PPO | Source: Ambulatory Visit | Attending: Cardiovascular Disease | Admitting: Cardiovascular Disease

## 2014-06-12 DIAGNOSIS — Z5189 Encounter for other specified aftercare: Secondary | ICD-10-CM | POA: Diagnosis not present

## 2014-06-12 DIAGNOSIS — I4901 Ventricular fibrillation: Secondary | ICD-10-CM | POA: Diagnosis not present

## 2014-06-12 DIAGNOSIS — Z955 Presence of coronary angioplasty implant and graft: Secondary | ICD-10-CM | POA: Diagnosis not present

## 2014-06-12 DIAGNOSIS — I2582 Chronic total occlusion of coronary artery: Secondary | ICD-10-CM | POA: Diagnosis not present

## 2014-06-12 DIAGNOSIS — I252 Old myocardial infarction: Secondary | ICD-10-CM | POA: Diagnosis not present

## 2014-06-13 ENCOUNTER — Other Ambulatory Visit (HOSPITAL_BASED_OUTPATIENT_CLINIC_OR_DEPARTMENT_OTHER): Payer: BC Managed Care – PPO

## 2014-06-13 ENCOUNTER — Telehealth: Payer: Self-pay | Admitting: Hematology

## 2014-06-13 ENCOUNTER — Ambulatory Visit (HOSPITAL_BASED_OUTPATIENT_CLINIC_OR_DEPARTMENT_OTHER): Payer: BC Managed Care – PPO | Admitting: Pharmacist

## 2014-06-13 DIAGNOSIS — I82409 Acute embolism and thrombosis of unspecified deep veins of unspecified lower extremity: Secondary | ICD-10-CM | POA: Diagnosis not present

## 2014-06-13 LAB — POCT INR: INR: 2

## 2014-06-13 LAB — PROTIME-INR
INR: 2 (ref 2.00–3.50)
Protime: 24 Seconds — ABNORMAL HIGH (ref 10.6–13.4)

## 2014-06-13 NOTE — Progress Notes (Signed)
Pt seen in clinic today INR=2.0 after adding an extra 5mg  after last visit She has no changes to report Her goal is 2.5-3, therefore I will increase her weekly dose  Change Coumadin to 7.5mg  daily except 10 mg on Fridays.   Return 06/28/14 at 9:00am for lab and 9:15am for Coumadin clinic appointment

## 2014-06-13 NOTE — Patient Instructions (Signed)
Change Coumadin to 7.5mg  daily except 10 mg on Fridays.  Return 06/28/14 at 9:00am for lab and 9:15am for Coumadin clinic appointment

## 2014-06-13 NOTE — Telephone Encounter (Signed)
per Kolleen to sch pt CC-pt aware °

## 2014-06-14 ENCOUNTER — Encounter (HOSPITAL_COMMUNITY)
Admission: RE | Admit: 2014-06-14 | Discharge: 2014-06-14 | Disposition: A | Discharge status: Home / Self Care | Payer: BC Managed Care – PPO | Source: Ambulatory Visit | Attending: Cardiovascular Disease | Admitting: Cardiovascular Disease

## 2014-06-14 ENCOUNTER — Telehealth: Payer: Self-pay | Admitting: Hematology

## 2014-06-14 ENCOUNTER — Encounter (HOSPITAL_COMMUNITY): Payer: BC Managed Care – PPO

## 2014-06-14 DIAGNOSIS — Z5189 Encounter for other specified aftercare: Secondary | ICD-10-CM | POA: Diagnosis not present

## 2014-06-14 DIAGNOSIS — Z955 Presence of coronary angioplasty implant and graft: Secondary | ICD-10-CM | POA: Diagnosis not present

## 2014-06-14 DIAGNOSIS — I252 Old myocardial infarction: Secondary | ICD-10-CM | POA: Diagnosis not present

## 2014-06-14 DIAGNOSIS — I4901 Ventricular fibrillation: Secondary | ICD-10-CM | POA: Diagnosis not present

## 2014-06-14 DIAGNOSIS — I2582 Chronic total occlusion of coronary artery: Secondary | ICD-10-CM | POA: Diagnosis not present

## 2014-06-14 NOTE — Telephone Encounter (Signed)
Confirm appointment change from 07/05 to 07/13. Mailed calendar.

## 2014-06-17 ENCOUNTER — Encounter (HOSPITAL_COMMUNITY)
Admission: RE | Admit: 2014-06-17 | Discharge: 2014-06-17 | Disposition: A | Discharge status: Home / Self Care | Payer: BC Managed Care – PPO | Source: Ambulatory Visit | Attending: Cardiovascular Disease | Admitting: Cardiovascular Disease

## 2014-06-17 ENCOUNTER — Encounter (HOSPITAL_COMMUNITY): Payer: BC Managed Care – PPO

## 2014-06-17 DIAGNOSIS — Z5189 Encounter for other specified aftercare: Secondary | ICD-10-CM | POA: Diagnosis not present

## 2014-06-17 DIAGNOSIS — I2582 Chronic total occlusion of coronary artery: Secondary | ICD-10-CM | POA: Diagnosis not present

## 2014-06-17 DIAGNOSIS — I4901 Ventricular fibrillation: Secondary | ICD-10-CM | POA: Diagnosis not present

## 2014-06-17 DIAGNOSIS — I252 Old myocardial infarction: Secondary | ICD-10-CM | POA: Diagnosis not present

## 2014-06-17 DIAGNOSIS — Z955 Presence of coronary angioplasty implant and graft: Secondary | ICD-10-CM | POA: Diagnosis not present

## 2014-06-18 DIAGNOSIS — E1165 Type 2 diabetes mellitus with hyperglycemia: Secondary | ICD-10-CM | POA: Diagnosis not present

## 2014-06-18 DIAGNOSIS — I10 Essential (primary) hypertension: Secondary | ICD-10-CM | POA: Diagnosis not present

## 2014-06-18 DIAGNOSIS — E782 Mixed hyperlipidemia: Secondary | ICD-10-CM | POA: Diagnosis not present

## 2014-06-19 ENCOUNTER — Ambulatory Visit (HOSPITAL_COMMUNITY)
Admission: RE | Admit: 2014-06-19 | Discharge: 2014-06-19 | Disposition: A | Discharge status: Home / Self Care | Payer: BC Managed Care – PPO | Source: Ambulatory Visit | Attending: Cardiovascular Disease | Admitting: Cardiovascular Disease

## 2014-06-19 ENCOUNTER — Other Ambulatory Visit: Payer: Self-pay

## 2014-06-19 ENCOUNTER — Encounter (HOSPITAL_COMMUNITY): Payer: BC Managed Care – PPO

## 2014-06-19 ENCOUNTER — Telehealth: Payer: Self-pay | Admitting: Cardiology

## 2014-06-19 ENCOUNTER — Encounter (HOSPITAL_COMMUNITY)
Admission: RE | Admit: 2014-06-19 | Discharge: 2014-06-19 | Disposition: A | Discharge status: Home / Self Care | Payer: BC Managed Care – PPO | Source: Ambulatory Visit | Attending: Cardiovascular Disease | Admitting: Cardiovascular Disease

## 2014-06-19 DIAGNOSIS — R001 Bradycardia, unspecified: Secondary | ICD-10-CM | POA: Insufficient documentation

## 2014-06-19 DIAGNOSIS — R9431 Abnormal electrocardiogram [ECG] [EKG]: Secondary | ICD-10-CM | POA: Diagnosis not present

## 2014-06-19 DIAGNOSIS — I4430 Unspecified atrioventricular block: Secondary | ICD-10-CM | POA: Diagnosis not present

## 2014-06-19 NOTE — Telephone Encounter (Signed)
Called to evaluate Shirley [name] for back pain while in cardiac rehab. Pt describes mid back pain. I could find point tenderness with palpation. It is unlike her previous anginal pain. EKG shows no acute changes. I suggested a short course of NSAIDs and will arrange for follow up in the office.   Kerin Ransom PA-C 06/19/2014 4:22 PM

## 2014-06-19 NOTE — Progress Notes (Signed)
Patient reported having pain in her back that started yesterday. Patient then reported having a pain in her chest that started today. Mrs Shirley [name] does not know what time it started. Patient described the discomfort as a 4 on a 1-10 scale. Skin warm and dry. Patient does not appear to be in any distress. Telemetry rhythm Sinus brady 54. Blood pressure 122/70. Oxygen saturation 96% on room air.12 lead ECG obtained. Showed Sinus with a first degree heart block. No exercise today. Kerin Ransom [facility] paged. Shirley [name] came to cardiac rehab  To assess Shirley [name] and to look at the 12 lead. Shirley [name] thought Shirley [name]'s discomfort is musculoskeletal and advised the patient to take tylenol as needed for the next few days. Shirley [name] said Shirley [name] may return to cardiac rehab on her next scheduled visit and will set up an appointment for Shirley [name] to be seen in the office next week. Shirley [name] is scheduled to graduate from cardiac rehab on Monday. PHQ =2. Shirley [name] says she feels depressed that she cannot drive and feels sad that she has to depend on her family members to get around. Currently Mrs Shirley [name] husband , children and grandchildren drive her work. Shirley [name] reported feeling better upon leaving cardiac rehab and plans to return to exercise on Friday. Shirley [name]'s daughter is going to look into the [facility] for exercise after she graduates from cardiac rehab.

## 2014-06-20 DIAGNOSIS — Z0000 Encounter for general adult medical examination without abnormal findings: Secondary | ICD-10-CM | POA: Diagnosis not present

## 2014-06-21 ENCOUNTER — Encounter (HOSPITAL_COMMUNITY)
Admission: RE | Admit: 2014-06-21 | Discharge: 2014-06-21 | Disposition: A | Discharge status: Home / Self Care | Payer: BC Managed Care – PPO | Source: Ambulatory Visit | Attending: Cardiovascular Disease | Admitting: Cardiovascular Disease

## 2014-06-21 ENCOUNTER — Telehealth: Payer: Self-pay | Admitting: Cardiovascular Disease

## 2014-06-21 ENCOUNTER — Encounter (HOSPITAL_COMMUNITY): Payer: BC Managed Care – PPO

## 2014-06-21 DIAGNOSIS — Z5189 Encounter for other specified aftercare: Secondary | ICD-10-CM | POA: Diagnosis not present

## 2014-06-21 DIAGNOSIS — Z955 Presence of coronary angioplasty implant and graft: Secondary | ICD-10-CM | POA: Diagnosis not present

## 2014-06-21 DIAGNOSIS — I4901 Ventricular fibrillation: Secondary | ICD-10-CM | POA: Diagnosis not present

## 2014-06-21 DIAGNOSIS — I2582 Chronic total occlusion of coronary artery: Secondary | ICD-10-CM | POA: Diagnosis not present

## 2014-06-21 DIAGNOSIS — I252 Old myocardial infarction: Secondary | ICD-10-CM | POA: Diagnosis not present

## 2014-06-24 ENCOUNTER — Encounter (HOSPITAL_COMMUNITY)
Admission: RE | Admit: 2014-06-24 | Discharge: 2014-06-24 | Disposition: A | Discharge status: Home / Self Care | Payer: BC Managed Care – PPO | Source: Ambulatory Visit | Attending: Cardiovascular Disease | Admitting: Cardiovascular Disease

## 2014-06-24 DIAGNOSIS — Z955 Presence of coronary angioplasty implant and graft: Secondary | ICD-10-CM | POA: Diagnosis not present

## 2014-06-24 DIAGNOSIS — I252 Old myocardial infarction: Secondary | ICD-10-CM | POA: Diagnosis not present

## 2014-06-24 DIAGNOSIS — I2582 Chronic total occlusion of coronary artery: Secondary | ICD-10-CM | POA: Diagnosis not present

## 2014-06-24 DIAGNOSIS — Z5189 Encounter for other specified aftercare: Secondary | ICD-10-CM | POA: Diagnosis not present

## 2014-06-24 DIAGNOSIS — I4901 Ventricular fibrillation: Secondary | ICD-10-CM | POA: Diagnosis not present

## 2014-06-24 NOTE — Progress Notes (Signed)
Dora graduated today.

## 2014-06-24 NOTE — Telephone Encounter (Signed)
Closed enounter °

## 2014-06-25 NOTE — Progress Notes (Signed)
Cardiology Office Note   Date:  06/26/2014   ID:  Shirley [name], DOB 1941-02-21, MRN [id number]  PCP:  Merrilee Seashore, MD  Cardiologist:  Dr. Lauree Chandler     Chief Complaint  Patient presents with  . Back Pain at Cardiac Rehab  . Coronary Artery Disease     History of Present Illness: Shirley [name] is a 74 y.o. female with a hx of CAD s/p inferior STEMI 01/2014 (c/b VF arrest >> defib in ED) treated with BMS to RCA, HTN, DVT.  She is on lifelong Coumadin.  She has been maintained on Plavix + Coumadin.  Last seen by Dr. Lauree Chandler 05/21/14.  She was evaluated by one of our PAs in Cardiac Rehab due to complaints of back pain that was felt to be MSK.  She was set up for FU here today.    She tells me that last week, she had chest and back discomfort off and on throughout the day. This lasted until the weekend. She's not had any symptoms since then. Symptoms are worse with positional changes. She denies any radiating symptoms, associated nausea or diaphoresis or associated dyspnea. Tylenol has helped. She has had the same symptoms off and on since she underwent CPR at the time of her myocardial infarction. She took nitroglycerin without relief. She denies pleuritic chest pain, chest pain with lying supine, coughing, wheezing, indigestion, pain associated with meals. She denies exertional chest pain, significant shortness of breath, orthopnea, PND. She has chronic pedal edema without change.   Studies/Reports Reviewed Today:  Echo 01/24/14 - Mild concentric hypertrophy. EF 55-60%. Wall motion was normal - Aortic valve: There was trivial regurgitation. - Ascending aorta: The ascending aorta was mildly dilated. - Mitral valve: Calcified annulus.   Cardiac cath/PCI 01/24/14 Left main: No obstructive disease.   Left Anterior Descending Artery:  No obstructive disease.   Circumflex Artery:  No obstructive disease.   Right Coronary Artery: Moderate caliber  co-dominant vessel with sub-totally occluded proximal vessel.  Left Ventricular Angiogram: LVEF=50% PCI Note: 3.0 x 16 mm Rebel BMS to proximal RCA.    Past Medical History  Diagnosis Date  . Hypertension   . Hemorrhoid   . GERD (gastroesophageal reflux disease)   . Adenomatous polyps   . DVT (deep venous thrombosis) 2003    lower extremity DVT with questionable recurrence in 2005. Treated with 1.5 year course of coumadin.  [name] History of TIAs     2004 - no documentation available. 09/2010 -  was not on aspirin or statin therapy prior to event.  . Endometrial thickening on ultra sound 07/2009    path showing Fragments of benign endocervical and squamous mucosa. No dysplasia or malignancy // Followed by Dr. [name]  . Coronary artery disease, non-occlusive 11/2003; 01/2014    LHC (11/2003) - LAD diffuse 30% stenosis of mid-vessel. RCA with 30% stenosis of proximal vessel - by Dr. Dannielle Burn. // 2D Echo (04/2006) -  LV EF 65%.  Mild LVH. No  wall motion abnormalities.  Mild diastolic dysfunction. 2015 with STEMI- stent palced  . Prediabetes DX: 09/2010    HgA1c 6.1  . Stroke   . Syncope 12/18/2013  . Hx of myocardial infarction less than 8 weeks 01/24/14  . Hyperlipidemia LDL goal <70     Past Surgical History  Procedure Laterality Date  . Tubal ligation    . Knee surgery  1996    Left knee arthroscopy.  . Colonoscopy w/ polypectomy  11/01/2007  8 mm rectal adenoma, hemorrhoids  . Rotator cuff repair      right.  . Coronary angioplasty with stent placement  01/24/14    Inf. STEMI, BMS to RCA  . Left heart catheterization with coronary angiogram N/A 01/24/2014    Procedure: LEFT HEART CATHETERIZATION WITH CORONARY ANGIOGRAM;  Surgeon: Burnell Blanks, MD;  Location: [facility] CATH LAB;  Service: Cardiovascular;  Laterality: N/A;     Current Outpatient Prescriptions  Medication Sig Dispense Refill  . acetaminophen (TYLENOL) 500 MG tablet Take 500 mg by mouth every 6 (six) hours  as needed for mild pain.     [name] atorvastatin (LIPITOR) 80 MG tablet Take 1 tablet (80 mg total) by mouth daily at 6 PM. 30 tablet 5  . clopidogrel (PLAVIX) 75 MG tablet Take 1 tablet (75 mg total) by mouth daily with breakfast. 30 tablet 11  . lisinopril-hydrochlorothiazide (PRINZIDE,ZESTORETIC) 10-12.5 MG per tablet     . metoprolol succinate (TOPROL-XL) 25 MG 24 hr tablet TAKE 1 TABLET (25 MG TOTAL) BY MOUTH DAILY. TAKE WITH OR IMMEDIATELY FOLLOWING A MEAL. 30 tablet 6  . Multiple Vitamin (MULTIVITAMIN WITH MINERALS) TABS Take 1 tablet by mouth every morning.    . nitroGLYCERIN (NITROSTAT) 0.4 MG SL tablet Place 1 tablet (0.4 mg total) under the tongue every 5 (five) minutes x 3 doses as needed for chest pain. 25 tablet 12  . warfarin (COUMADIN) 5 MG tablet Take 7.5 mg (1.5 tablet) by mouth daily or as instructed by the coumadin clinic 45 tablet 3   No current facility-administered medications for this visit.    Allergies:   Review of patient's allergies indicates no known allergies.    Social History:  The patient  reports that she has never smoked. She has never used smokeless tobacco. She reports that she does not drink alcohol or use illicit drugs.   Family History:  The patient's family history includes Alzheimer's disease in her maternal grandmother; Angina in her father; Dementia in her mother; Diabetes in her mother; Heart attack in her father; Heart disease in her brother and mother; Hyperlipidemia in her mother; Liver cancer in her sister; Thyroid cancer in her sister. There is no history of Stroke.    ROS:   Please see the history of present illness.   Review of Systems  Cardiovascular: Positive for chest pain and leg swelling.  Musculoskeletal: Positive for joint pain.  Psychiatric/Behavioral: The patient is nervous/anxious.   All other systems reviewed and are negative.     PHYSICAL EXAM: VS:  BP 140/70 mmHg  Pulse 51  Ht 5\' 4"  (1.626 m)  Wt 265 lb (120.203 kg)  BMI  45.46 kg/m2    Wt Readings from Last 3 Encounters:  06/26/14 265 lb (120.203 kg)  05/21/14 261 lb 9.6 oz (118.661 kg)  04/09/14 266 lb (120.657 kg)     GEN: Well nourished, well developed, in no acute distress HEENT: normal Neck: no JVD, no masses Cardiac:  Normal S1/S2, RRR; no murmur ,  no rubs or gallops, 1+ edema  Chest: No tenderness to palpation Respiratory:  clear to auscultation bilaterally, no wheezing, rhonchi or rales. GI: soft, nontender, nondistended, + BS MS: no deformity or atrophy Skin: warm and dry  Neuro:  CNs II-XII intact, Strength and sensation are intact Psych: Normal affect   EKG:  EKG is ordered today.  It demonstrates:   Sinus bradycardia, HR 51, LAD, first degree AV block (PR 252 ms), no change since prior tracing  Recent Labs: 01/24/2014: BUN 12; Creatinine 0.59; Potassium 4.2; Sodium 135* 03/19/2014: ALT 22 04/09/2014: Hemoglobin 12.2; Platelets 254    Lipid Panel    Component Value Date/Time   CHOL 107 03/19/2014 0745   TRIG 110.0 03/19/2014 0745   HDL 24.40* 03/19/2014 0745   CHOLHDL 4 03/19/2014 0745   VLDL 22.0 03/19/2014 0745   LDLCALC 61 03/19/2014 0745      ASSESSMENT AND PLAN:  Intercostal pain Her symptoms appear to be related to CPR at the time of her myocardial infarction. She has had the same symptoms off and on since that time. We discussed conservative measures including moist heat and Tylenol as needed. She was using a hand bike at cardiac rehabilitation. I have advised that she hold off on using this for now. She will contact us if her symptoms should change or progress.  Coronary artery disease involving native coronary artery of native heart without angina pectoris No angina. Continue Plavix in addition to Coumadin, statin, beta blocker, ACE inhibitor. She has graduated from cardiac rehabilitation. I will refer her to the Providers Referral Exercise Program at the [facility].  Essential hypertension Fair  control.  Hyperlipidemia Continue statin.   DVT (deep venous thrombosis), bilateral  She remains on Coumadin.   Current medicines are reviewed at length with the patient today.  The patient does not have concerns regarding medicines.  The following changes have been made:  no change  Labs/ tests ordered today include:   Orders Placed This Encounter  Procedures  . EKG 12-Lead    Disposition:   FU with Dr. Lauree Chandler as planned.    Signed, Versie Starks, MHS 06/26/2014 8:28 AM    [facility] [address], [place],   [address] Phone: [phone]; Fax: [phone]

## 2014-06-26 ENCOUNTER — Ambulatory Visit (INDEPENDENT_AMBULATORY_CARE_PROVIDER_SITE_OTHER): Payer: BC Managed Care – PPO | Admitting: Physician Assistant

## 2014-06-26 ENCOUNTER — Encounter: Payer: Self-pay | Admitting: Physician Assistant

## 2014-06-26 VITALS — BP 140/70 | HR 51 | Ht 64.0 in | Wt 265.0 lb

## 2014-06-26 DIAGNOSIS — E785 Hyperlipidemia, unspecified: Secondary | ICD-10-CM

## 2014-06-26 DIAGNOSIS — I10 Essential (primary) hypertension: Secondary | ICD-10-CM | POA: Diagnosis not present

## 2014-06-26 DIAGNOSIS — R0782 Intercostal pain: Secondary | ICD-10-CM | POA: Diagnosis not present

## 2014-06-26 DIAGNOSIS — I2510 Atherosclerotic heart disease of native coronary artery without angina pectoris: Secondary | ICD-10-CM | POA: Diagnosis not present

## 2014-06-26 DIAGNOSIS — I82403 Acute embolism and thrombosis of unspecified deep veins of lower extremity, bilateral: Secondary | ICD-10-CM

## 2014-06-26 DIAGNOSIS — M5489 Other dorsalgia: Secondary | ICD-10-CM

## 2014-06-26 NOTE — Patient Instructions (Signed)
Continue to use Tylenol as needed for pain.  Use moist heat 2-3 times a day until your chest discomfort completely resolves.  Schedule follow-up sooner if symptoms worsen or change.

## 2014-06-28 ENCOUNTER — Other Ambulatory Visit (HOSPITAL_BASED_OUTPATIENT_CLINIC_OR_DEPARTMENT_OTHER): Payer: BC Managed Care – PPO

## 2014-06-28 ENCOUNTER — Ambulatory Visit (HOSPITAL_BASED_OUTPATIENT_CLINIC_OR_DEPARTMENT_OTHER): Payer: BC Managed Care – PPO | Admitting: Pharmacist

## 2014-06-28 DIAGNOSIS — I82409 Acute embolism and thrombosis of unspecified deep veins of unspecified lower extremity: Secondary | ICD-10-CM

## 2014-06-28 LAB — PROTIME-INR
INR: 4.5 — AB (ref 2.00–3.50)
PROTIME: 54 s — AB (ref 10.6–13.4)

## 2014-06-28 LAB — POCT INR: INR: 4.5

## 2014-06-28 NOTE — Patient Instructions (Signed)
INR above goal No coumadin tonight then decrease to 7.5 mg daily.   Return 07/05/14 at 9:00am for lab and 9:15am for Coumadin clinic appointment

## 2014-06-28 NOTE — Progress Notes (Signed)
INR above goal today at 4.5 (goal 2.5-3) INR likely elevated due to recent dose increases due to subtherapeutic INRs Pt is doing well with no complaints No diet or medication changes No missed or extra doses No unusual bleeding or bruising Since goal is to keep INR closer to 3 I will only hold x 1 day (vs. Holding for 2 days) and will decrease dose slightly. Plan:  No coumadin tonight  then decrease to 7.5 mg daily. Recheck in 1 week Return 07/05/14 at 9:00am for lab and 9:15am for Coumadin clinic appointment

## 2014-07-02 ENCOUNTER — Emergency Department (HOSPITAL_COMMUNITY): Payer: BC Managed Care – PPO

## 2014-07-02 ENCOUNTER — Observation Stay (HOSPITAL_COMMUNITY)
Admission: EM | Admit: 2014-07-02 | Discharge: 2014-07-03 | Disposition: A | Discharge status: Home / Self Care | Payer: BC Managed Care – PPO | Attending: Internal Medicine | Admitting: Internal Medicine

## 2014-07-02 ENCOUNTER — Encounter (HOSPITAL_COMMUNITY): Payer: Self-pay | Admitting: Emergency Medicine

## 2014-07-02 DIAGNOSIS — Z86010 Personal history of colonic polyps: Secondary | ICD-10-CM | POA: Insufficient documentation

## 2014-07-02 DIAGNOSIS — I2510 Atherosclerotic heart disease of native coronary artery without angina pectoris: Secondary | ICD-10-CM | POA: Diagnosis present

## 2014-07-02 DIAGNOSIS — Z86718 Personal history of other venous thrombosis and embolism: Secondary | ICD-10-CM | POA: Diagnosis not present

## 2014-07-02 DIAGNOSIS — Z9851 Tubal ligation status: Secondary | ICD-10-CM | POA: Insufficient documentation

## 2014-07-02 DIAGNOSIS — Z7902 Long term (current) use of antithrombotics/antiplatelets: Secondary | ICD-10-CM | POA: Insufficient documentation

## 2014-07-02 DIAGNOSIS — I10 Essential (primary) hypertension: Secondary | ICD-10-CM | POA: Diagnosis present

## 2014-07-02 DIAGNOSIS — K219 Gastro-esophageal reflux disease without esophagitis: Secondary | ICD-10-CM | POA: Diagnosis present

## 2014-07-02 DIAGNOSIS — Z8673 Personal history of transient ischemic attack (TIA), and cerebral infarction without residual deficits: Secondary | ICD-10-CM | POA: Insufficient documentation

## 2014-07-02 DIAGNOSIS — Z955 Presence of coronary angioplasty implant and graft: Secondary | ICD-10-CM | POA: Insufficient documentation

## 2014-07-02 DIAGNOSIS — Z7901 Long term (current) use of anticoagulants: Secondary | ICD-10-CM | POA: Insufficient documentation

## 2014-07-02 DIAGNOSIS — E785 Hyperlipidemia, unspecified: Secondary | ICD-10-CM | POA: Diagnosis not present

## 2014-07-02 DIAGNOSIS — K2970 Gastritis, unspecified, without bleeding: Secondary | ICD-10-CM | POA: Diagnosis not present

## 2014-07-02 DIAGNOSIS — R0789 Other chest pain: Secondary | ICD-10-CM | POA: Diagnosis not present

## 2014-07-02 DIAGNOSIS — R079 Chest pain, unspecified: Secondary | ICD-10-CM | POA: Diagnosis present

## 2014-07-02 DIAGNOSIS — R112 Nausea with vomiting, unspecified: Secondary | ICD-10-CM | POA: Diagnosis not present

## 2014-07-02 DIAGNOSIS — I252 Old myocardial infarction: Secondary | ICD-10-CM | POA: Insufficient documentation

## 2014-07-02 LAB — I-STAT TROPONIN, ED: TROPONIN I, POC: 0 ng/mL (ref 0.00–0.08)

## 2014-07-02 MED ORDER — SODIUM CHLORIDE 0.9 % IV BOLUS (SEPSIS)
500.0000 mL | Freq: Once | INTRAVENOUS | Status: AC
  Administered 2014-07-03: 500 mL via INTRAVENOUS

## 2014-07-02 NOTE — ED Notes (Addendum)
Per EMS: Pt has been having chest pressure continuously through-out the day.  As the evening went on pt sts pressure got worse (8/10)/  Pt has nausea and one episode of vomiting, and pressure was relieved (4/10).  Pt with a hx of MI in Oct 2015.  Pt has 324 ASA, denies nausea at this time.  12 lead unremarkable.  Pt currently on coumadin and plavix treatment for DVT.

## 2014-07-02 NOTE — ED Provider Notes (Signed)
CSN: [id number]     Arrival date & time 07/02/14  2218 History   First MD Initiated Contact with Patient 07/02/14 2247     Chief Complaint  Patient presents with  . Chest Pain     (Consider location/radiation/quality/duration/timing/severity/associated sxs/prior Treatment) Patient is a 74 y.o. female presenting with chest pain. The history is provided by the patient. No language interpreter was used.  Chest Pain Associated symptoms: dizziness, nausea and vomiting   Associated symptoms: no abdominal pain, no back pain, no fever, no numbness, no shortness of breath and no weakness   Shirley [name] is a 74 y/o F with PMHx of HTN, DVT on Coumadin, TIA, prediabetic, syncope, stroke history, CAD with inferior STEMI 01/2014 with cardiac cath performed with stent placement x 1 presenting to the ED with chest pain. Patient reported that since her chest pain has been ongoing since she had the stent placed in 01/2014 - reported that the pain is a constant pressure pain that stays in her chest and back as well. Reported that she has been seen by the cardiologist on Friday who reported that the pain was a muscular pain and stated that warm compressions and tylenol would be beneficial patient reported that this has not helped. Patient reported that today she experienced a different pain - reported that the pain is localized to the left side of her chest radiating up into her neck - reported that the pain is a pressure tightness pain and stated that it has been constant. Reported that at approximately 9:30-9:40 PM this evening reported that she felt nauseous and had continuous vomiting. Reported that EMS was called and that EMS administered 324 mg of ASA. Patient reported that since the aspirin was administered there is been no pain. Denied jaw pain, blurred vision, sudden loss of vision, left arm pain, numbness, tingling, weakness, dizziness, travels, leg swelling. PCP Dr. Ashby Dawes Cardiology Dr.  [name]  Past Medical History  Diagnosis Date  . Hypertension   . Hemorrhoid   . GERD (gastroesophageal reflux disease)   . Adenomatous polyps   . DVT (deep venous thrombosis) 2003    lower extremity DVT with questionable recurrence in 2005. Treated with 1.5 year course of coumadin.  [name] History of TIAs     2004 - no documentation available. 09/2010 -  was not on aspirin or statin therapy prior to event.  . Endometrial thickening on ultra sound 07/2009    path showing Fragments of benign endocervical and squamous mucosa. No dysplasia or malignancy // Followed by Dr. [name]  . Coronary artery disease, non-occlusive 11/2003; 01/2014    LHC (11/2003) - LAD diffuse 30% stenosis of mid-vessel. RCA with 30% stenosis of proximal vessel - by Dr. Dannielle Burn. // 2D Echo (04/2006) -  LV EF 65%.  Mild LVH. No  wall motion abnormalities.  Mild diastolic dysfunction. 2015 with STEMI- stent palced  . Prediabetes DX: 09/2010    HgA1c 6.1  . Stroke   . Syncope 12/18/2013  . Hx of myocardial infarction less than 8 weeks 01/24/14  . Hyperlipidemia LDL goal <70    Past Surgical History  Procedure Laterality Date  . Tubal ligation    . Knee surgery  1996    Left knee arthroscopy.  . Colonoscopy w/ polypectomy  11/01/2007    8 mm rectal adenoma, hemorrhoids  . Rotator cuff repair      right.  . Coronary angioplasty with stent placement  01/24/14    Inf. STEMI, BMS to  RCA  . Left heart catheterization with coronary angiogram N/A 01/24/2014    Procedure: LEFT HEART CATHETERIZATION WITH CORONARY ANGIOGRAM;  Surgeon: Burnell Blanks, MD;  Location: [facility] CATH LAB;  Service: Cardiovascular;  Laterality: N/A;   Family History  Problem Relation Age of Onset  . Thyroid cancer Sister   . Liver cancer Sister   . Diabetes Mother   . Hyperlipidemia Mother   . Heart disease Mother   . Dementia Mother   . Angina Father   . Heart disease Brother     x 2 in 71s yo  . Alzheimer's disease Maternal  Grandmother   . Heart attack Father   . Stroke Neg Hx    History  Substance Use Topics  . Smoking status: Never Smoker   . Smokeless tobacco: Never Used  . Alcohol Use: No   OB History    No data available     Review of Systems  Constitutional: Negative for fever and chills.  Eyes: Negative for visual disturbance.  Respiratory: Negative for chest tightness and shortness of breath.   Cardiovascular: Positive for chest pain.  Gastrointestinal: Positive for nausea and vomiting. Negative for abdominal pain, diarrhea, constipation, blood in stool and anal bleeding.  Musculoskeletal: Negative for back pain, neck pain and neck stiffness.  Neurological: Positive for dizziness. Negative for weakness and numbness.      Allergies  Review of patient's allergies indicates no known allergies.  Home Medications   Prior to Admission medications   Medication Sig Start Date End Date Taking? Authorizing Provider  acetaminophen (TYLENOL) 500 MG tablet Take 500 mg by mouth every 6 (six) hours as needed for mild pain.    Yes Historical Provider, MD  atorvastatin (LIPITOR) 80 MG tablet Take 1 tablet (80 mg total) by mouth daily at 6 PM. 01/27/14  Yes Brett Canales, PA-C  clopidogrel (PLAVIX) 75 MG tablet Take 1 tablet (75 mg total) by mouth daily with breakfast. 01/27/14  Yes Brett Canales, PA-C  lisinopril-hydrochlorothiazide (PRINZIDE,ZESTORETIC) 10-12.5 MG per tablet  01/30/14  Yes Historical Provider, MD  metoprolol succinate (TOPROL-XL) 25 MG 24 hr tablet TAKE 1 TABLET (25 MG TOTAL) BY MOUTH DAILY. TAKE WITH OR IMMEDIATELY FOLLOWING A MEAL. 05/28/14  Yes Burnell Blanks, MD  Multiple Vitamin (MULTIVITAMIN WITH MINERALS) TABS Take 1 tablet by mouth every morning.   Yes Historical Provider, MD  nitroGLYCERIN (NITROSTAT) 0.4 MG SL tablet Place 1 tablet (0.4 mg total) under the tongue every 5 (five) minutes x 3 doses as needed for chest pain. 01/27/14  Yes Brett Canales, PA-C  warfarin  (COUMADIN) 5 MG tablet Take 7.5 mg (1.5 tablet) by mouth daily or as instructed by the coumadin clinic Patient taking differently: Take 5-10 mg by mouth daily. Take 2 tablets on Thursday, 1 tablet on Monday, none on Friday then take 1 and 1/2 tablets all the other days 04/09/14  Yes Truitt Merle, MD   BP 141/53 mmHg  Pulse 66  Temp(Src) 98.5 F (36.9 C) (Oral)  Resp 20  Ht 5\' 2"  (1.575 m)  Wt 267 lb (121.11 kg)  BMI 48.82 kg/m2  SpO2 99% Physical Exam  Constitutional: She is oriented to person, place, and time. She appears well-developed and well-nourished. No distress.  HENT:  Head: Normocephalic and atraumatic.  Mouth/Throat: Oropharynx is clear and moist. No oropharyngeal exudate.  Eyes: Conjunctivae and EOM are normal. Pupils are equal, round, and reactive to light. Right eye exhibits no discharge. Left eye exhibits no  discharge.  Neck: Normal range of motion. Neck supple. No tracheal deviation present.  Cardiovascular: Normal rate, regular rhythm and normal heart sounds.  Exam reveals no friction rub.   No murmur heard. Pulses:      Radial pulses are 2+ on the right side, and 2+ on the left side.       Dorsalis pedis pulses are 2+ on the right side, and 2+ on the left side.  Cap refill < 3 seconds Negative swelling or pitting edema noted to the lower extremities bilaterally   Pulmonary/Chest: Effort normal and breath sounds normal. No respiratory distress. She has no wheezes. She has no rales. She exhibits no tenderness.  Musculoskeletal: Normal range of motion.  Full ROM to upper and lower extremities without difficulty noted, negative ataxia noted.  Lymphadenopathy:    She has no cervical adenopathy.  Neurological: She is alert and oriented to person, place, and time. No cranial nerve deficit. She exhibits normal muscle tone. Coordination normal.  Skin: Skin is warm and dry. No rash noted. She is not diaphoretic. No erythema.  Psychiatric: She has a normal mood and affect. Her  behavior is normal. Thought content normal.  Nursing note and vitals reviewed.   ED Course  Procedures (including critical care time)  Results for orders placed or performed during the hospital encounter of 07/02/14  CBC  Result Value Ref Range   WBC 12.3 (H) 4.0 - 10.5 K/uL   RBC 4.83 3.87 - 5.11 MIL/uL   Hemoglobin 13.3 12.0 - 15.0 g/dL   HCT 40.2 36.0 - 46.0 %   MCV 83.2 78.0 - 100.0 fL   MCH 27.5 26.0 - 34.0 pg   MCHC 33.1 30.0 - 36.0 g/dL   RDW 14.7 11.5 - 15.5 %   Platelets 245 150 - 400 K/uL  Basic metabolic panel  Result Value Ref Range   Sodium 134 (L) 135 - 145 mmol/L   Potassium 3.7 3.5 - 5.1 mmol/L   Chloride 98 96 - 112 mmol/L   CO2 23 19 - 32 mmol/L   Glucose, Bld 106 (H) 70 - 99 mg/dL   BUN 11 6 - 23 mg/dL   Creatinine, Ser 0.71 0.50 - 1.10 mg/dL   Calcium 8.6 8.4 - 10.5 mg/dL   GFR calc non Af Amer 83 (L) >90 mL/min   GFR calc Af Amer >90 >90 mL/min   Anion gap 13 5 - 15  Protime-INR (if pt is taking Coumadin)  Result Value Ref Range   Prothrombin Time 23.5 (H) 11.6 - 15.2 seconds   INR 2.07 (H) 0.00 - 1.49  Troponin I  Result Value Ref Range   Troponin I <0.03 <0.031 ng/mL  I-stat troponin, ED (not at [facility])  Result Value Ref Range   Troponin i, poc 0.00 0.00 - 0.08 ng/mL   Comment 3            Labs Review Labs Reviewed  CBC - Abnormal; Notable for the following:    WBC 12.3 (*)    All other components within normal limits  BASIC METABOLIC PANEL - Abnormal; Notable for the following:    Sodium 134 (*)    Glucose, Bld 106 (*)    GFR calc non Af Amer 83 (*)    All other components within normal limits  PROTIME-INR - Abnormal; Notable for the following:    Prothrombin Time 23.5 (*)    INR 2.07 (*)    All other components within normal limits  TROPONIN I  Randolm Idol, ED    Imaging Review Dg Chest 2 View  07/02/2014   CLINICAL DATA:  Acute onset of centralized chest pain and vomiting. Initial encounter.  EXAM: CHEST  2 VIEW  COMPARISON:   Chest radiograph performed 01/24/2014  FINDINGS: The lungs are well-aerated. Pulmonary vascularity is at the upper limits of normal. Minimal bibasilar atelectasis is seen. There is no evidence of pleural effusion or pneumothorax.  The heart is borderline enlarged. The somewhat unusual soft tissue density overlying the right paraspinal region at the lower mediastinum appears to reflect a tortuous thoracic aorta, on correlation with prior CTA from 2015. No acute osseous abnormalities are seen.  IMPRESSION: Minimal bibasilar atelectasis noted; lungs otherwise clear.   Electronically Signed   By: Garald Balding M.D.   On: 07/02/2014 23:01     EKG Interpretation None       12:14 AM This provider spoke with Dr. Saunders Revel, Cardiology. Discussed case, labs, imaging, vitals, ED course in great detail. Recommended patient to be admitted to Medicine and Cards to follow.   1:28 AM This provider spoke with Dr. Marlowe Sax, [facility]. Discussed case, labs, imaging, vitals, ED course in great detail. Patient to be admitted to Telemetry floor for observation.   MDM   Final diagnoses:  Chest pain, unspecified chest pain type    Medications  sodium chloride 0.9 % bolus 500 mL (0 mLs Intravenous Stopped 07/03/14 0107)    Filed Vitals:   07/02/14 2226 07/02/14 2229 07/02/14 2315  BP:  124/99 141/53  Pulse:  66 66  Temp:  98.5 F (36.9 C)   TempSrc:  Oral   Resp:  18 20  Height:  5\' 2"  (1.575 m)   Weight:  267 lb (121.11 kg)   SpO2: 98% 98% 99%   This provider reviewed the patient's chart. Patient was seen and assessed in ED setting in October 2015 she was diagnosed with an inferior STEMI. Cardiac catheterization performed that identified a subtotal occluded RCA, stent placed x 1.  EKG noted normal sinus rhythm a heart rate of 67 bpm, prolonged PR interval with a left anterior fascicular block. Troponin negative elevation. CBC noted mildly elevated white pus or chronotropic 0.3. Hemoglobin 13.3,  hematocrit 40.2. BMP noted mildly low sodium of 134. PT INR elevated secondary to Coumadin-INR 2.07. Chest x-ray noted minimal basilar atelectasis, lungs otherwise clear. Cardiology was consulted, recommended admission under the care of medicine and cardiology to follow. This provider spoke with Dr. Posey Pronto, [facility]. Patient to be admitted to telemetry for observation and for cardiac monitoring. Discussed plan for admission with patient who agrees to plan of care. Patient stable for transfer to floor.  Jamse Mead, PA-C 07/03/14 0149  Orlie Dakin, MD 07/04/14 0010

## 2014-07-03 DIAGNOSIS — K219 Gastro-esophageal reflux disease without esophagitis: Secondary | ICD-10-CM

## 2014-07-03 DIAGNOSIS — I10 Essential (primary) hypertension: Secondary | ICD-10-CM

## 2014-07-03 DIAGNOSIS — R079 Chest pain, unspecified: Secondary | ICD-10-CM

## 2014-07-03 DIAGNOSIS — R072 Precordial pain: Secondary | ICD-10-CM

## 2014-07-03 DIAGNOSIS — I2510 Atherosclerotic heart disease of native coronary artery without angina pectoris: Secondary | ICD-10-CM

## 2014-07-03 DIAGNOSIS — R1114 Bilious vomiting: Secondary | ICD-10-CM

## 2014-07-03 DIAGNOSIS — Z7901 Long term (current) use of anticoagulants: Secondary | ICD-10-CM

## 2014-07-03 LAB — COMPREHENSIVE METABOLIC PANEL
ALT: 21 U/L (ref 0–35)
ANION GAP: 7 (ref 5–15)
AST: 20 U/L (ref 0–37)
Albumin: 2.7 g/dL — ABNORMAL LOW (ref 3.5–5.2)
Alkaline Phosphatase: 110 U/L (ref 39–117)
BUN: 9 mg/dL (ref 6–23)
CHLORIDE: 105 mmol/L (ref 96–112)
CO2: 26 mmol/L (ref 19–32)
Calcium: 8.6 mg/dL (ref 8.4–10.5)
Creatinine, Ser: 0.63 mg/dL (ref 0.50–1.10)
GFR calc Af Amer: >90 mL/min (ref >90)
GFR calc non Af Amer: 86 mL/min — ABNORMAL LOW (ref >90)
GLUCOSE: 104 mg/dL — AB (ref 70–99)
POTASSIUM: 3.7 mmol/L (ref 3.5–5.1)
Sodium: 138 mmol/L (ref 135–145)
Total Bilirubin: 0.8 mg/dL (ref 0.3–1.2)
Total Protein: 5.6 g/dL — ABNORMAL LOW (ref 6.0–8.3)

## 2014-07-03 LAB — CBC WITH DIFFERENTIAL/PLATELET
BASOS ABS: 0 10*3/uL (ref 0.0–0.1)
BASOS PCT: 0 % (ref 0–1)
EOS ABS: 0.2 10*3/uL (ref 0.0–0.7)
EOS PCT: 2 % (ref 0–5)
HEMATOCRIT: 36.2 % (ref 36.0–46.0)
Hemoglobin: 11.5 g/dL — ABNORMAL LOW (ref 12.0–15.0)
Lymphocytes Relative: 35 % (ref 12–46)
Lymphs Abs: 3.3 10*3/uL (ref 0.7–4.0)
MCH: 26.8 pg (ref 26.0–34.0)
MCHC: 31.8 g/dL (ref 30.0–36.0)
MCV: 84.4 fL (ref 78.0–100.0)
Monocytes Absolute: 0.4 10*3/uL (ref 0.1–1.0)
Monocytes Relative: 4 % (ref 3–12)
Neutro Abs: 5.5 10*3/uL (ref 1.7–7.7)
Neutrophils Relative %: 59 % (ref 43–77)
Platelets: 206 10*3/uL (ref 150–400)
RBC: 4.29 MIL/uL (ref 3.87–5.11)
RDW: 14.9 % (ref 11.5–15.5)
WBC: 9.4 10*3/uL (ref 4.0–10.5)

## 2014-07-03 LAB — CBC
HCT: 40.2 % (ref 36.0–46.0)
Hemoglobin: 13.3 g/dL (ref 12.0–15.0)
MCH: 27.5 pg (ref 26.0–34.0)
MCHC: 33.1 g/dL (ref 30.0–36.0)
MCV: 83.2 fL (ref 78.0–100.0)
PLATELETS: 245 10*3/uL (ref 150–400)
RBC: 4.83 MIL/uL (ref 3.87–5.11)
RDW: 14.7 % (ref 11.5–15.5)
WBC: 12.3 10*3/uL — ABNORMAL HIGH (ref 4.0–10.5)

## 2014-07-03 LAB — BASIC METABOLIC PANEL
ANION GAP: 13 (ref 5–15)
BUN: 11 mg/dL (ref 6–23)
CHLORIDE: 98 mmol/L (ref 96–112)
CO2: 23 mmol/L (ref 19–32)
CREATININE: 0.71 mg/dL (ref 0.50–1.10)
Calcium: 8.6 mg/dL (ref 8.4–10.5)
GFR calc Af Amer: >90 mL/min (ref >90)
GFR, EST NON AFRICAN AMERICAN: 83 mL/min — AB (ref >90)
GLUCOSE: 106 mg/dL — AB (ref 70–99)
Potassium: 3.7 mmol/L (ref 3.5–5.1)
SODIUM: 134 mmol/L — AB (ref 135–145)

## 2014-07-03 LAB — PROTIME-INR
INR: 1.89 — AB (ref 0.00–1.49)
INR: 2.07 — AB (ref 0.00–1.49)
PROTHROMBIN TIME: 21.9 s — AB (ref 11.6–15.2)
Prothrombin Time: 23.5 seconds — ABNORMAL HIGH (ref 11.6–15.2)

## 2014-07-03 LAB — LIPASE, BLOOD
LIPASE: 20 U/L (ref 11–59)
LIPASE: 20 U/L (ref 11–59)

## 2014-07-03 LAB — TROPONIN I
Troponin I: <0.03 ng/mL (ref <0.031)
Troponin I: <0.03 ng/mL (ref <0.031)

## 2014-07-03 MED ORDER — WARFARIN SODIUM 7.5 MG PO TABS
7.5000 mg | ORAL_TABLET | Freq: Every day | ORAL | Status: DC
  Filled 2014-07-03: qty 1

## 2014-07-03 MED ORDER — ONDANSETRON HCL 4 MG/2ML IJ SOLN: 4.0000 mg | Freq: Four times a day (QID) | INTRAMUSCULAR | Status: DC | PRN

## 2014-07-03 MED ORDER — PANTOPRAZOLE SODIUM 40 MG IV SOLR
40.0000 mg | INTRAVENOUS | Status: DC
  Administered 2014-07-03: 40 mg via INTRAVENOUS
  Filled 2014-07-03: qty 40

## 2014-07-03 MED ORDER — ACETAMINOPHEN 325 MG PO TABS: 650.0000 mg | ORAL_TABLET | ORAL | Status: DC | PRN

## 2014-07-03 MED ORDER — ATORVASTATIN CALCIUM 80 MG PO TABS
80.0000 mg | ORAL_TABLET | Freq: Every day | ORAL | Status: DC
  Filled 2014-07-03: qty 1

## 2014-07-03 MED ORDER — METOPROLOL SUCCINATE ER 25 MG PO TB24
25.0000 mg | ORAL_TABLET | Freq: Every day | ORAL | Status: DC
  Filled 2014-07-03: qty 1

## 2014-07-03 MED ORDER — NITROGLYCERIN 0.4 MG SL SUBL: 0.4000 mg | SUBLINGUAL_TABLET | SUBLINGUAL | Status: DC | PRN

## 2014-07-03 MED ORDER — CLOPIDOGREL BISULFATE 75 MG PO TABS
75.0000 mg | ORAL_TABLET | Freq: Every day | ORAL | Status: DC
  Administered 2014-07-03: 75 mg via ORAL
  Filled 2014-07-03 (×2): qty 1

## 2014-07-03 MED ORDER — WARFARIN SODIUM 7.5 MG PO TABS
7.5000 mg | ORAL_TABLET | ORAL | Status: AC
  Administered 2014-07-03: 7.5 mg via ORAL
  Filled 2014-07-03: qty 1

## 2014-07-03 MED ORDER — WARFARIN - PHARMACIST DOSING INPATIENT: Freq: Every day | Status: DC

## 2014-07-03 MED ORDER — LISINOPRIL 10 MG PO TABS
10.0000 mg | ORAL_TABLET | Freq: Every day | ORAL | Status: DC
  Filled 2014-07-03: qty 1

## 2014-07-03 MED ORDER — LISINOPRIL-HYDROCHLOROTHIAZIDE 10-12.5 MG PO TABS: 1.0000 | ORAL_TABLET | Freq: Every day | ORAL | Status: DC

## 2014-07-03 MED ORDER — ASPIRIN EC 81 MG PO TBEC
81.0000 mg | DELAYED_RELEASE_TABLET | Freq: Every day | ORAL | Status: DC
  Filled 2014-07-03: qty 1

## 2014-07-03 MED ORDER — MORPHINE SULFATE 2 MG/ML IJ SOLN: 2.0000 mg | INTRAMUSCULAR | Status: DC | PRN

## 2014-07-03 MED ORDER — ENOXAPARIN SODIUM 40 MG/0.4ML ~~LOC~~ SOLN: 40.0000 mg | SUBCUTANEOUS | Status: DC

## 2014-07-03 MED ORDER — HYDROCHLOROTHIAZIDE 12.5 MG PO CAPS
12.5000 mg | ORAL_CAPSULE | Freq: Every day | ORAL | Status: DC
  Filled 2014-07-03: qty 1

## 2014-07-03 NOTE — Progress Notes (Signed)
Patient d/c, instructions given and all questions answered. Assessments remains unchanged prior to discharge.

## 2014-07-03 NOTE — Discharge Instructions (Signed)

## 2014-07-03 NOTE — Discharge Summary (Signed)
Discharge Summary  Shirley [name] DVV:[id number] DOB: 10/28/1940  PCP: Merrilee Seashore, MD  Admit date: 07/02/2014 Discharge date: 07/03/2014  Time spent: <35mins  Recommendations for Outpatient Follow-up:  1. F/u with PMD and cardiology for post hospital follow up. 2. F/u with coumadin clinic, patient already has an appointment.  Discharge Diagnoses:  Active Hospital Problems   Diagnosis Date Noted  . Chest pain 07/03/2014  . Hypertension 09/23/2010  . GERD (gastroesophageal reflux disease) 07/07/2010  . CAD in native artery, stent to RCA BMS with MI 2015 11/04/2003    Resolved Hospital Problems   Diagnosis Date Noted Date Resolved  No resolved problems to display.    Discharge Condition: stable  Diet recommendation: heart healthy/carb modified  Filed Weights   07/02/14 2229 07/03/14 0340  Weight: 121.11 kg (267 lb) 117.3 kg (258 lb 9.6 oz)    History of present illness:  Shirley [name] is a 74 y.o. female with Past medical history of coronary artery disease status post PCI, recurrent DVT ( xarelto resistance per patient, on chronic coumadin), TIA, hypertension, GERD, CVA. The patient is presenting with complaints of chest pain. The pain started this afternoon. The patient mentions that the pain is different than her chronic pain that she has been having since last few months. She reported that she has been having intermittent chronic chest pain after CPR that was done on her in 01/2014 when she had a heart attack. The pain felt like more pressure and did not resolve and therefore she came to the ER. She mentions is compliant with all her medications. She also complains of some nausea and had episodes of vomiting today. There was no blood in her vomitus. She denies any prior history of acid reflux. She has chronic leg swelling. No leg tenderness. She denies any shortness of breath or cough.  The patient is coming from home. And at her baseline independent  for most of her ADL.   Hospital Course:  Principal Problem:   Chest pain Active Problems:   GERD (gastroesophageal reflux disease)   Hypertension   CAD in native artery, stent to RCA BMS with MI 2015  1. Chest pain   her initial EKG and troponin does not show any signs of acute ischemia. her chest x-ray is showing atelectasis. Pt is on warfarin and INR is therapeutic as well as she does not have any hypoxia. Negative cardiac enzymes, no acute ekg changes,  2D echocardiogram  Continue with aspirin, beta blocker, statin, sublingual nitroglycerin. cardiology has seen patient, recommend chest pain likely musculoskeletal, discharge with outpatient cardiology follow up.   2.essential hypertension Continue home medication   3.h/o recurrent DVT, reported resistant to xarelto Continue warfarin, patient is closed followed up by coumadin clinic due to fluctuating INR level.  4. Gastritis Prn zantac, patient was told by cardiology to avoid omeprazole due to possible interaction with plavix.  Advance goals of care discussion: full code   Procedures:  none  Consultations:  cardiology  Discharge Exam: BP 153/58 mmHg  Pulse 57  Temp(Src) 98.2 F (36.8 C) (Oral)  Resp 16  Ht 5\' 3"  (1.6 m)  Wt 117.3 kg (258 lb 9.6 oz)  BMI 45.82 kg/m2  SpO2 99%  General: Alert, Awake and Oriented to Time, Place and Person. Obese, NAD Eyes: PERRL ENT: Oral Mucosa clear moist. Neck: no JVD Cardiovascular: S1 and S2 Present, no Murmur, Peripheral Pulses Present, chest wall tenderness. Respiratory: Bilateral Air entry equal and Decreased, Clear to Auscultation, noCrackles, no wheezes  Abdomen: Bowel Sound present, Soft and non tender Skin: no Rash Extremities: trace Pedal edema, no calf tenderness Neurologic: Grossly no focal neuro deficit.   Discharge Instructions You were cared for by a hospitalist during your hospital stay. If you have any questions about your discharge medications or  the care you received while you were in the hospital after you are discharged, you can call the unit and asked to speak with the hospitalist on call if the hospitalist that took care of you is not available. Once you are discharged, your primary care physician will handle any further medical issues. Please note that NO REFILLS for any discharge medications will be authorized once you are discharged, as it is imperative that you return to your primary care physician (or establish a relationship with a primary care physician if you do not have one) for your aftercare needs so that they can reassess your need for medications and monitor your lab values.  Discharge Instructions    Diet - low sodium heart healthy    Complete by:  As directed      Increase activity slowly    Complete by:  As directed             Medication List    TAKE these medications        acetaminophen 500 MG tablet  Commonly known as:  TYLENOL  Take 500 mg by mouth every 6 (six) hours as needed for mild pain.     atorvastatin 80 MG tablet  Commonly known as:  LIPITOR  Take 1 tablet (80 mg total) by mouth daily at 6 PM.     clopidogrel 75 MG tablet  Commonly known as:  PLAVIX  Take 1 tablet (75 mg total) by mouth daily with breakfast.     lisinopril-hydrochlorothiazide 10-12.5 MG per tablet  Commonly known as:  PRINZIDE,ZESTORETIC     metoprolol succinate 25 MG 24 hr tablet  Commonly known as:  TOPROL-XL  TAKE 1 TABLET (25 MG TOTAL) BY MOUTH DAILY. TAKE WITH OR IMMEDIATELY FOLLOWING A MEAL.     multivitamin with minerals Tabs tablet  Take 1 tablet by mouth every morning.     nitroGLYCERIN 0.4 MG SL tablet  Commonly known as:  NITROSTAT  Place 1 tablet (0.4 mg total) under the tongue every 5 (five) minutes x 3 doses as needed for chest pain.     warfarin 5 MG tablet  Commonly known as:  COUMADIN  Take 7.5 mg (1.5 tablet) by mouth daily or as instructed by the coumadin clinic       No Known Allergies       Follow-up Information    Follow up with [facility], MD In 2 weeks.   Specialty:  Internal Medicine   Why:  post hospital follow up   Contact information:   [address] [place] [address] [phone]       Follow up with Lauree Chandler, MD In 3 weeks.   Specialty:  Cardiology   Why:  post hospital follow up for chest pain   Contact information:   [address] [place] [address] [phone]        The results of significant diagnostics from this hospitalization (including imaging, microbiology, ancillary and laboratory) are listed below for reference.    Significant Diagnostic Studies: Dg Chest 2 View  07/02/2014   CLINICAL DATA:  Acute onset of centralized chest pain and vomiting. Initial encounter.  EXAM: CHEST  2  VIEW  COMPARISON:  Chest radiograph performed 01/24/2014  FINDINGS: The lungs are well-aerated. Pulmonary vascularity is at the upper limits of normal. Minimal bibasilar atelectasis is seen. There is no evidence of pleural effusion or pneumothorax.  The heart is borderline enlarged. The somewhat unusual soft tissue density overlying the right paraspinal region at the lower mediastinum appears to reflect a tortuous thoracic aorta, on correlation with prior CTA from 2015. No acute osseous abnormalities are seen.  IMPRESSION: Minimal bibasilar atelectasis noted; lungs otherwise clear.   Electronically Signed   By: Garald Balding M.D.   On: 07/02/2014 23:01    Microbiology: No results found for this or any previous visit (from the past 240 hour(s)).   Labs: Basic Metabolic Panel:  Recent Labs Lab 07/02/14 2333 07/03/14 0536  NA 134* 138  K 3.7 3.7  CL 98 105  CO2 23 26  GLUCOSE 106* 104*  BUN 11 9  CREATININE 0.71 0.63  CALCIUM 8.6 8.6   Liver Function Tests:  Recent Labs Lab 07/03/14 0536  AST 20  ALT 21  ALKPHOS 110  BILITOT 0.8  PROT 5.6*  ALBUMIN 2.7*    Recent Labs Lab 07/03/14 0500  07/03/14 0530  LIPASE 20 20   No results for input(s): AMMONIA in the last 168 hours. CBC:  Recent Labs Lab 07/02/14 2333 07/03/14 0536  WBC 12.3* 9.4  NEUTROABS  --  5.5  HGB 13.3 11.5*  HCT 40.2 36.2  MCV 83.2 84.4  PLT 245 206   Cardiac Enzymes:  Recent Labs Lab 07/02/14 2333 07/03/14 0530  TROPONINI <0.03 <0.03   BNP: BNP (last 3 results) No results for input(s): BNP in the last 8760 hours.  ProBNP (last 3 results) No results for input(s): PROBNP in the last 8760 hours.  CBG: No results for input(s): GLUCAP in the last 168 hours.     SignedFlorencia Reasons MD, PhD  [facility] 07/03/2014, 1:07 PM

## 2014-07-03 NOTE — Progress Notes (Addendum)
ANTICOAGULATION CONSULT NOTE - Initial Consult  Pharmacy Consult for Coumadin Indication: h/o VTE  No Known Allergies  Patient Measurements: Height: 5\' 2"  (157.5 cm) Weight: 267 lb (121.11 kg) IBW/kg (Calculated) : 50.1  Vital Signs: Temp: 98.5 F (36.9 C) (03/29 2229) Temp Source: Oral (03/29 2229) BP: 137/61 mmHg (03/30 0215) Pulse Rate: 63 (03/30 0215)  Labs:  Recent Labs  07/02/14 2333  HGB 13.3  HCT 40.2  PLT 245  LABPROT 23.5*  INR 2.07*  CREATININE 0.71  TROPONINI <0.03    Estimated Creatinine Clearance: 76.5 mL/min (by C-G formula based on Cr of 0.71).   Medical History: Past Medical History  Diagnosis Date  . Hypertension   . Hemorrhoid   . GERD (gastroesophageal reflux disease)   . Adenomatous polyps   . DVT (deep venous thrombosis) 2003    lower extremity DVT with questionable recurrence in 2005. Treated with 1.5 year course of coumadin.  [name] History of TIAs     2004 - no documentation available. 09/2010 -  was not on aspirin or statin therapy prior to event.  . Endometrial thickening on ultra sound 07/2009    path showing Fragments of benign endocervical and squamous mucosa. No dysplasia or malignancy // Followed by Dr. [name]  . Coronary artery disease, non-occlusive 11/2003; 01/2014    LHC (11/2003) - LAD diffuse 30% stenosis of mid-vessel. RCA with 30% stenosis of proximal vessel - by Dr. Dannielle Burn. // 2D Echo (04/2006) -  LV EF 65%.  Mild LVH. No  wall motion abnormalities.  Mild diastolic dysfunction. 2015 with STEMI- stent palced  . Prediabetes DX: 09/2010    HgA1c 6.1  . Stroke   . Syncope 12/18/2013  . Hx of myocardial infarction less than 8 weeks 01/24/14  . Hyperlipidemia LDL goal <70     Medications:   (Not in a hospital admission) Scheduled:  . aspirin EC  81 mg Oral Daily  . atorvastatin  80 mg Oral q1800  . clopidogrel  75 mg Oral Q breakfast  . lisinopril-hydrochlorothiazide  1 tablet Oral Daily  . metoprolol succinate  25  mg Oral Daily    Assessment: 74yo female c/o chest pressure worsening during day, reports nausea w/ one episode of emesis that helped relieve chest pressure, given recent MI being admitted for further cards w/u, to continue Coumadin for h/o VTE; current INR therapeutic w/ last dose of Coumadin PTA on 3/28.  Goal of Therapy:  INR 2-3   Plan:  Will continue home Coumadin dose of 7.5mg  daily and monitor INR.   Wynona Neat, PharmD, BCPS  07/03/2014,3:05 AM  Warfarin given early this am, INR down this am but with recent dose given will continue home dose with 7.5mg  tonight.  Erin Hearing PharmD., BCPS Clinical Pharmacist Pager [phone] 07/03/2014 8:46 AM

## 2014-07-03 NOTE — Consult Note (Signed)
Primary cardiologist: CM  HPI: 74 y.o. female with PMH hx of CAD s/p inferior STEMI 01/2014 (c/b VF arrest >> defib in ED) treated with BMS to RCA, HTN, DVT for evaluation of chest pain. She is on lifelong Coumadin. She has been maintained on Plavix + Coumadin.  Patient has had intermittent chest pain since October. This has been felt to be musculoskeletal. She states she began having chest pain yesterday morning at approximately 9 AM. The pain was substernal. It increased with certain movements and was relieved with stretching. It did not completely resolve all day. She did have an episode of nausea and vomiting after eating. She had mild dizziness. There was no dyspnea but there was mild diaphoresis. Note she states the pain was not like her myocardial infarction pain. She denies dyspnea.   Studies/Reports Reviewed Today:  Echo 01/24/14 - Mild concentric hypertrophy. EF 55-60%. Wall motion was normal - Aortic valve: There was trivial regurgitation. - Ascending aorta: The ascending aorta was mildly dilated. - Mitral valve: Calcified annulus.   Cardiac cath/PCI 01/24/14 Left main: No obstructive disease.  Left Anterior Descending Artery: No obstructive disease.  Circumflex Artery: No obstructive disease.  Right Coronary Artery: Moderate caliber co-dominant vessel with sub-totally occluded proximal vessel.  Left Ventricular Angiogram: LVEF=50% PCI Note: 3.0 x 16 mm Rebel BMS to proximal RCA.   Medications Prior to Admission  Medication Sig Dispense Refill  . acetaminophen (TYLENOL) 500 MG tablet Take 500 mg by mouth every 6 (six) hours as needed for mild pain.     [name] atorvastatin (LIPITOR) 80 MG tablet Take 1 tablet (80 mg total) by mouth daily at 6 PM. 30 tablet 5  . clopidogrel (PLAVIX) 75 MG tablet Take 1 tablet (75 mg total) by mouth daily with breakfast. 30 tablet 11  . lisinopril-hydrochlorothiazide (PRINZIDE,ZESTORETIC) 10-12.5 MG per tablet     . metoprolol succinate  (TOPROL-XL) 25 MG 24 hr tablet TAKE 1 TABLET (25 MG TOTAL) BY MOUTH DAILY. TAKE WITH OR IMMEDIATELY FOLLOWING A MEAL. 30 tablet 6  . Multiple Vitamin (MULTIVITAMIN WITH MINERALS) TABS Take 1 tablet by mouth every morning.    . nitroGLYCERIN (NITROSTAT) 0.4 MG SL tablet Place 1 tablet (0.4 mg total) under the tongue every 5 (five) minutes x 3 doses as needed for chest pain. 25 tablet 12  . warfarin (COUMADIN) 5 MG tablet Take 7.5 mg (1.5 tablet) by mouth daily or as instructed by the coumadin clinic (Patient taking differently: Take 5-10 mg by mouth daily. Take 2 tablets on Thursday, 1 tablet on Monday, none on Friday then take 1 and 1/2 tablets all the other days) 45 tablet 3    No Known Allergies  Past Medical History  Diagnosis Date  . Hypertension   . Hemorrhoid   . GERD (gastroesophageal reflux disease)   . Adenomatous polyps   . DVT (deep venous thrombosis) 2003    lower extremity DVT with questionable recurrence in 2005. Treated with 1.5 year course of coumadin.  [name] History of TIAs     2004 - no documentation available. 09/2010 -  was not on aspirin or statin therapy prior to event.  . Endometrial thickening on ultra sound 07/2009    path showing Fragments of benign endocervical and squamous mucosa. No dysplasia or malignancy // Followed by Dr. [name]  . Coronary artery disease, non-occlusive 11/2003; 01/2014    LHC (11/2003) - LAD diffuse 30% stenosis of mid-vessel. RCA with 30% stenosis of proximal vessel - by  Dr. Dannielle Burn. // 2D Echo (04/2006) -  LV EF 65%.  Mild LVH. No  wall motion abnormalities.  Mild diastolic dysfunction. 2015 with STEMI- stent palced  . Prediabetes DX: 09/2010    HgA1c 6.1  . Stroke   . Syncope 12/18/2013  . Hx of myocardial infarction less than 8 weeks 01/24/14  . Hyperlipidemia LDL goal <70     Past Surgical History  Procedure Laterality Date  . Tubal ligation    . Knee surgery  1996    Left knee arthroscopy.  . Colonoscopy w/ polypectomy   11/01/2007    8 mm rectal adenoma, hemorrhoids  . Rotator cuff repair      right.  . Coronary angioplasty with stent placement  01/24/14    Inf. STEMI, BMS to RCA  . Left heart catheterization with coronary angiogram N/A 01/24/2014    Procedure: LEFT HEART CATHETERIZATION WITH CORONARY ANGIOGRAM;  Surgeon: Burnell Blanks, MD;  Location: [facility] CATH LAB;  Service: Cardiovascular;  Laterality: N/A;    History   Social History  . Marital Status: Married    Spouse Name: N/A  . Number of Children: 6  . Years of Education: College   Occupational History  . Control and instrumentation engineer   . [employer]   Social History Main Topics  . Smoking status: Never Smoker   . Smokeless tobacco: Never Used  . Alcohol Use: No  . Drug Use: No  . Sexual Activity: Not on file   Other Topics Concern  . Not on file   Social History Narrative   Full Code status.   Insurance: BCBS    Family History  Problem Relation Age of Onset  . Thyroid cancer Sister   . Liver cancer Sister   . Diabetes Mother   . Hyperlipidemia Mother   . Heart disease Mother   . Dementia Mother   . Angina Father   . Heart disease Brother     x 2 in 61s yo  . Alzheimer's disease Maternal Grandmother   . Heart attack Father   . Stroke Neg Hx     ROS:  Subjective fever and chills but productive cough, hemoptysis, dysphasia, odynophagia, melena, hematochezia, dysuria, hematuria, rash, seizure activity, orthopnea, PND, pedal edema, claudication. Remaining systems are negative.  Physical Exam:   Blood pressure 153/58, pulse 57, temperature 98.2 F (36.8 C), temperature source Oral, resp. rate 16, height '5\' 3"'  (1.6 m), weight 258 lb 9.6 oz (117.3 kg), SpO2 99 %.  General:  Well developed/obese in NAD Skin warm/dry Patient not depressed No peripheral clubbing Back-normal HEENT-normal/normal eyelids Neck supple/normal carotid upstroke bilaterally; no bruits; no JVD; no  thyromegaly chest - CTA/ normal expansion; tender to palpation CV - RRR/normal S1 and S2; no murmurs, rubs or gallops;  PMI nondisplaced Abdomen -NT/ND, no HSM, no mass, + bowel sounds, no bruit 2+ femoral pulses, no bruits Ext-no edema, chords, 2+ DP Neuro-grossly nonfocal  ECG sinus rhythm, first-degree AV block, left ventricular hypertrophy, incomplete right bundle branch block, no ST changes.  Results for orders placed or performed during the hospital encounter of 07/02/14 (from the past 48 hour(s))  CBC     Status: Abnormal   Collection Time: 07/02/14 11:33 PM  Result Value Ref Range   WBC 12.3 (H) 4.0 - 10.5 K/uL   RBC 4.83 3.87 - 5.11 MIL/uL   Hemoglobin 13.3 12.0 - 15.0 g/dL   HCT 40.2 36.0 - 46.0 %   MCV 83.2 78.0 -  100.0 fL   MCH 27.5 26.0 - 34.0 pg   MCHC 33.1 30.0 - 36.0 g/dL   RDW 14.7 11.5 - 15.5 %   Platelets 245 150 - 400 K/uL  Basic metabolic panel     Status: Abnormal   Collection Time: 07/02/14 11:33 PM  Result Value Ref Range   Sodium 134 (L) 135 - 145 mmol/L   Potassium 3.7 3.5 - 5.1 mmol/L   Chloride 98 96 - 112 mmol/L   CO2 23 19 - 32 mmol/L   Glucose, Bld 106 (H) 70 - 99 mg/dL   BUN 11 6 - 23 mg/dL   Creatinine, Ser 0.71 0.50 - 1.10 mg/dL   Calcium 8.6 8.4 - 10.5 mg/dL   GFR calc non Af Amer 83 (L) >90 mL/min   GFR calc Af Amer >90 >90 mL/min    Comment: (NOTE) The eGFR has been calculated using the CKD EPI equation. This calculation has not been validated in all clinical situations. eGFR's persistently <90 mL/min signify possible Chronic Kidney Disease.    Anion gap 13 5 - 15  Protime-INR (if pt is taking Coumadin)     Status: Abnormal   Collection Time: 07/02/14 11:33 PM  Result Value Ref Range   Prothrombin Time 23.5 (H) 11.6 - 15.2 seconds   INR 2.07 (H) 0.00 - 1.49  Troponin I     Status: None   Collection Time: 07/02/14 11:33 PM  Result Value Ref Range   Troponin I <0.03 <0.031 ng/mL    Comment:        NO INDICATION OF MYOCARDIAL  INJURY.   I-stat troponin, ED (not at [facility])     Status: None   Collection Time: 07/02/14 11:40 PM  Result Value Ref Range   Troponin i, poc 0.00 0.00 - 0.08 ng/mL   Comment 3            Comment: Due to the release kinetics of cTnI, a negative result within the first hours of the onset of symptoms does not rule out myocardial infarction with certainty. If myocardial infarction is still suspected, repeat the test at appropriate intervals.   Lipase, blood     Status: None   Collection Time: 07/03/14  5:00 AM  Result Value Ref Range   Lipase 20 11 - 59 U/L  Troponin I (q 6hr x 3)     Status: None   Collection Time: 07/03/14  5:30 AM  Result Value Ref Range   Troponin I <0.03 <0.031 ng/mL    Comment:        NO INDICATION OF MYOCARDIAL INJURY.   Lipase, blood     Status: None   Collection Time: 07/03/14  5:30 AM  Result Value Ref Range   Lipase 20 11 - 59 U/L  Protime-INR     Status: Abnormal   Collection Time: 07/03/14  5:36 AM  Result Value Ref Range   Prothrombin Time 21.9 (H) 11.6 - 15.2 seconds   INR 1.89 (H) 0.00 - 1.49  CBC with Differential/Platelet     Status: Abnormal   Collection Time: 07/03/14  5:36 AM  Result Value Ref Range   WBC 9.4 4.0 - 10.5 K/uL   RBC 4.29 3.87 - 5.11 MIL/uL   Hemoglobin 11.5 (L) 12.0 - 15.0 g/dL   HCT 36.2 36.0 - 46.0 %   MCV 84.4 78.0 - 100.0 fL   MCH 26.8 26.0 - 34.0 pg   MCHC 31.8 30.0 - 36.0 g/dL   RDW 14.9  11.5 - 15.5 %   Platelets 206 150 - 400 K/uL   Neutrophils Relative % 59 43 - 77 %   Neutro Abs 5.5 1.7 - 7.7 K/uL   Lymphocytes Relative 35 12 - 46 %   Lymphs Abs 3.3 0.7 - 4.0 K/uL   Monocytes Relative 4 3 - 12 %   Monocytes Absolute 0.4 0.1 - 1.0 K/uL   Eosinophils Relative 2 0 - 5 %   Eosinophils Absolute 0.2 0.0 - 0.7 K/uL   Basophils Relative 0 0 - 1 %   Basophils Absolute 0.0 0.0 - 0.1 K/uL  Comprehensive metabolic panel     Status: Abnormal   Collection Time: 07/03/14  5:36 AM  Result Value Ref Range   Sodium 138  135 - 145 mmol/L   Potassium 3.7 3.5 - 5.1 mmol/L   Chloride 105 96 - 112 mmol/L   CO2 26 19 - 32 mmol/L   Glucose, Bld 104 (H) 70 - 99 mg/dL   BUN 9 6 - 23 mg/dL   Creatinine, Ser 0.63 0.50 - 1.10 mg/dL   Calcium 8.6 8.4 - 10.5 mg/dL   Total Protein 5.6 (L) 6.0 - 8.3 g/dL   Albumin 2.7 (L) 3.5 - 5.2 g/dL   AST 20 0 - 37 U/L   ALT 21 0 - 35 U/L   Alkaline Phosphatase 110 39 - 117 U/L   Total Bilirubin 0.8 0.3 - 1.2 mg/dL   GFR calc non Af Amer 86 (L) >90 mL/min   GFR calc Af Amer >90 >90 mL/min    Comment: (NOTE) The eGFR has been calculated using the CKD EPI equation. This calculation has not been validated in all clinical situations. eGFR's persistently <90 mL/min signify possible Chronic Kidney Disease.    Anion gap 7 5 - 15    Dg Chest 2 View  07/02/2014   CLINICAL DATA:  Acute onset of centralized chest pain and vomiting. Initial encounter.  EXAM: CHEST  2 VIEW  COMPARISON:  Chest radiograph performed 01/24/2014  FINDINGS: The lungs are well-aerated. Pulmonary vascularity is at the upper limits of normal. Minimal bibasilar atelectasis is seen. There is no evidence of pleural effusion or pneumothorax.  The heart is borderline enlarged. The somewhat unusual soft tissue density overlying the right paraspinal region at the lower mediastinum appears to reflect a tortuous thoracic aorta, on correlation with prior CTA from 2015. No acute osseous abnormalities are seen.  IMPRESSION: Minimal bibasilar atelectasis noted; lungs otherwise clear.   Electronically Signed   By: Garald Balding M.D.   On: 07/02/2014 23:01    Assessment/Plan 1 chest pain-symptoms are most consistent with musculoskeletal pain. She has had problems with intermittent chest pain since her infarct in October. Her pain yesterday lasted all day and her electrocardiogram shows no acute ST changes and her enzymes are negative. Her pain increases with certain movements. Would continue with Tylenol as needed. I do not think  she needs further ischemia evaluation at this point. 2 coronary artery disease-continue Plavix and statin. Continue beta blocker. Discontinue aspirin as she is on Coumadin. 3 history of DVT-continue preadmission dose of Coumadin. 4 hypertension-continue preadmission medications. Patient can be discharged from a cardiac standpoint. She should follow-up with Dr. [name] in 2-4 weeks Kirk Ruths MD 07/03/2014, 10:27 AM

## 2014-07-03 NOTE — H&P (Signed)
[facility] History and Physical  [name]  MRN: [id number]  DOB: [birth year]  DOS: the patient was seen and examined on 07/03/2014 PCP: Merrilee Seashore, MD  Chief Complaint: Chest pain  HPI: Shirley [name] is a 74 y.o. female with Past medical history of coronary artery disease status post PCI, DVT, TIA, hypertension, GERD, CVA. The patient is presenting with complaints of chest pain. The pain started this afternoon. The patient mentions that the pain is different than her chronic pain that she has been having since last few months. The pain felt like more pressure and did not resolve and therefore she came to the ER. She mentions is compliant with all her medications. She also complains of some nausea and had episodes of vomiting today. There was no blood in her vomitus. She denies any prior history of acid reflux. She has chronic leg swelling. No leg tenderness. She denies any shortness of breath or cough.  The patient is coming from home. And at her baseline independent for most of her ADL.  Review of Systems: as mentioned in the history of present illness.  A Comprehensive review of the other systems is negative.  Past Medical History  Diagnosis Date  . Hypertension   . Hemorrhoid   . GERD (gastroesophageal reflux disease)   . Adenomatous polyps   . DVT (deep venous thrombosis) 2003    lower extremity DVT with questionable recurrence in 2005. Treated with 1.5 year course of coumadin.  [name] History of TIAs     2004 - no documentation available. 09/2010 -  was not on aspirin or statin therapy prior to event.  . Endometrial thickening on ultra sound 07/2009    path showing Fragments of benign endocervical and squamous mucosa. No dysplasia or malignancy // Followed by Dr. [name]  . Coronary artery disease, non-occlusive 11/2003; 01/2014    LHC (11/2003) - LAD diffuse 30% stenosis of mid-vessel. RCA with 30% stenosis of proximal vessel - by Dr.  Dannielle Burn. // 2D Echo (04/2006) -  LV EF 65%.  Mild LVH. No  wall motion abnormalities.  Mild diastolic dysfunction. 2015 with STEMI- stent palced  . Prediabetes DX: 09/2010    HgA1c 6.1  . Stroke   . Syncope 12/18/2013  . Hx of myocardial infarction less than 8 weeks 01/24/14  . Hyperlipidemia LDL goal <70    Past Surgical History  Procedure Laterality Date  . Tubal ligation    . Knee surgery  1996    Left knee arthroscopy.  . Colonoscopy w/ polypectomy  11/01/2007    8 mm rectal adenoma, hemorrhoids  . Rotator cuff repair      right.  . Coronary angioplasty with stent placement  01/24/14    Inf. STEMI, BMS to RCA  . Left heart catheterization with coronary angiogram N/A 01/24/2014    Procedure: LEFT HEART CATHETERIZATION WITH CORONARY ANGIOGRAM;  Surgeon: Burnell Blanks, MD;  Location: [facility] CATH LAB;  Service: Cardiovascular;  Laterality: N/A;   Social History:  reports that she has never smoked. She has never used smokeless tobacco. She reports that she does not drink alcohol or use illicit drugs.  No Known Allergies  Family History  Problem Relation Age of Onset  . Thyroid cancer Sister   . Liver cancer Sister   . Diabetes Mother   . Hyperlipidemia Mother   . Heart disease Mother   . Dementia Mother   . Angina Father   . Heart disease Brother  x 2 in 86s yo  . Alzheimer's disease Maternal Grandmother   . Heart attack Father   . Stroke Neg Hx     Prior to Admission medications   Medication Sig Start Date End Date Taking? Authorizing Provider  acetaminophen (TYLENOL) 500 MG tablet Take 500 mg by mouth every 6 (six) hours as needed for mild pain.    Yes Historical Provider, MD  atorvastatin (LIPITOR) 80 MG tablet Take 1 tablet (80 mg total) by mouth daily at 6 PM. 01/27/14  Yes Brett Canales, PA-C  clopidogrel (PLAVIX) 75 MG tablet Take 1 tablet (75 mg total) by mouth daily with breakfast. 01/27/14  Yes Brett Canales, PA-C  lisinopril-hydrochlorothiazide  (PRINZIDE,ZESTORETIC) 10-12.5 MG per tablet  01/30/14  Yes Historical Provider, MD  metoprolol succinate (TOPROL-XL) 25 MG 24 hr tablet TAKE 1 TABLET (25 MG TOTAL) BY MOUTH DAILY. TAKE WITH OR IMMEDIATELY FOLLOWING A MEAL. 05/28/14  Yes Burnell Blanks, MD  Multiple Vitamin (MULTIVITAMIN WITH MINERALS) TABS Take 1 tablet by mouth every morning.   Yes Historical Provider, MD  nitroGLYCERIN (NITROSTAT) 0.4 MG SL tablet Place 1 tablet (0.4 mg total) under the tongue every 5 (five) minutes x 3 doses as needed for chest pain. 01/27/14  Yes Brett Canales, PA-C  warfarin (COUMADIN) 5 MG tablet Take 7.5 mg (1.5 tablet) by mouth daily or as instructed by the coumadin clinic Patient taking differently: Take 5-10 mg by mouth daily. Take 2 tablets on Thursday, 1 tablet on Monday, none on Friday then take 1 and 1/2 tablets all the other days 04/09/14  Yes Truitt Merle, MD    Physical Exam: Filed Vitals:   07/03/14 0145 07/03/14 0200 07/03/14 0215 07/03/14 0340  BP: 126/52 139/60 137/61 153/58  Pulse: 65 72 63 57  Temp:    98.2 F (36.8 C)  TempSrc:    Oral  Resp: 17 17 14 16   Height:    5\' 3"  (1.6 m)  Weight:    117.3 kg (258 lb 9.6 oz)  SpO2: 95% 93% 98% 99%    General: Alert, Awake and Oriented to Time, Place and Person. Appear in mild distress Eyes: PERRL ENT: Oral Mucosa clear moist. Neck: no JVD Cardiovascular: S1 and S2 Present, no Murmur, Peripheral Pulses Present Respiratory: Bilateral Air entry equal and Decreased, Clear to Auscultation, noCrackles, no wheezes Abdomen: Bowel Sound present, Soft and non tender Skin: no Rash Extremities: trace Pedal edema, no calf tenderness Neurologic: Grossly no focal neuro deficit.  Labs on Admission:  CBC:  Recent Labs Lab 07/02/14 2333  WBC 12.3*  HGB 13.3  HCT 40.2  MCV 83.2  PLT 245    CMP     Component Value Date/Time   NA 134* 07/02/2014 2333   K 3.7 07/02/2014 2333   CL 98 07/02/2014 2333   CO2 23 07/02/2014 2333   GLUCOSE  106* 07/02/2014 2333   BUN 11 07/02/2014 2333   CREATININE 0.71 07/02/2014 2333   CREATININE 0.69 11/02/2010 1656   CALCIUM 8.6 07/02/2014 2333   PROT 6.4 03/19/2014 0745   ALBUMIN 3.1* 03/19/2014 0745   AST 19 03/19/2014 0745   ALT 22 03/19/2014 0745   ALKPHOS 127* 03/19/2014 0745   BILITOT 0.5 03/19/2014 0745   GFRNONAA 83* 07/02/2014 2333   GFRAA >90 07/02/2014 2333    No results for input(s): LIPASE, AMYLASE in the last 168 hours.   Recent Labs Lab 07/02/14 2333  TROPONINI <0.03   BNP (last 3 results) No  results for input(s): BNP in the last 8760 hours.  ProBNP (last 3 results) No results for input(s): PROBNP in the last 8760 hours.   Radiological Exams on Admission: Dg Chest 2 View  07/02/2014   CLINICAL DATA:  Acute onset of centralized chest pain and vomiting. Initial encounter.  EXAM: CHEST  2 VIEW  COMPARISON:  Chest radiograph performed 01/24/2014  FINDINGS: The lungs are well-aerated. Pulmonary vascularity is at the upper limits of normal. Minimal bibasilar atelectasis is seen. There is no evidence of pleural effusion or pneumothorax.  The heart is borderline enlarged. The somewhat unusual soft tissue density overlying the right paraspinal region at the lower mediastinum appears to reflect a tortuous thoracic aorta, on correlation with prior CTA from 2015. No acute osseous abnormalities are seen.  IMPRESSION: Minimal bibasilar atelectasis noted; lungs otherwise clear.   Electronically Signed   By: Garald Balding M.D.   On: 07/02/2014 23:01    EKG: Independently reviewed. normal sinus rhythm, nonspecific ST and T waves changes.  Assessment/Plan Principal Problem:   Chest pain Active Problems:   GERD (gastroesophageal reflux disease)   Hypertension   CAD in native artery, stent to RCA BMS with MI 2015   1. Chest pain  The patient presents with chest pain   her initial EKG and troponin does not show any signs of acute ischemia. her chest x-ray is showing  atelectasis. Pt is on warfarin and INR is therapeutic as well as she does not have any hypoxia. At present I will admit the patient to the hospital for observation. I will obtain serial troponin every 6 hours, monitor on telemetry, get 2D echocardiogram in the morning to rule out ACS.  Continue with aspirin, beta blocker, statin, sublingual nitroglycerin.   2.essential hypertension Continue home medication   3.h/o DVT Continue warfarin  4. Gastritis Possible gastritis as a cause of her nausea and vomiting. Continue close monitoring, no abd tenderness, normal bowel sounds.   Advance goals of care discussion: full code   Consults: cardiology  DVT Prophylaxis: on chronic therapeutic anticoagulation. Nutrition: npo  Family Communication: family was present at bedside, opportunity was given to ask question and all questions were answered satisfactorily at the time of interview. Disposition: Admitted to observation in telemetry unit.  Author: Berle Mull, MD [facility] Pager: [phone] 07/03/2014, 4:05 AM    If 7PM-7AM, please contact night-coverage [email or web address] Password [email or web address]

## 2014-07-05 ENCOUNTER — Ambulatory Visit (HOSPITAL_BASED_OUTPATIENT_CLINIC_OR_DEPARTMENT_OTHER): Payer: BC Managed Care – PPO | Admitting: Pharmacist

## 2014-07-05 ENCOUNTER — Other Ambulatory Visit (HOSPITAL_BASED_OUTPATIENT_CLINIC_OR_DEPARTMENT_OTHER): Payer: BC Managed Care – PPO

## 2014-07-05 DIAGNOSIS — I82402 Acute embolism and thrombosis of unspecified deep veins of left lower extremity: Secondary | ICD-10-CM

## 2014-07-05 DIAGNOSIS — Z7901 Long term (current) use of anticoagulants: Secondary | ICD-10-CM

## 2014-07-05 DIAGNOSIS — I82409 Acute embolism and thrombosis of unspecified deep veins of unspecified lower extremity: Secondary | ICD-10-CM

## 2014-07-05 LAB — PROTIME-INR
INR: 3.4 (ref 2.00–3.50)
PROTIME: 40.8 s — AB (ref 10.6–13.4)

## 2014-07-05 LAB — POCT INR: INR: 3.4

## 2014-07-05 NOTE — Patient Instructions (Signed)
Decrease Coumadin to 5mg  on Mon and Fri and continue the 7.5 mg the other days.   Return 07/19/14 at 3:00pm for lab and 3:15pm for Coumadin clinic appointment

## 2014-07-05 NOTE — Progress Notes (Signed)
Pt seen in clinic today with her daugher INR=3.4 Pt had a recent hospitalization for chest pain and r/o MI Her INR was 4.5 on 3/25 and decreased to 2.0 by admission on 3/29 and is 3.4 today She did not miss any doses this past week No new meds She was told by the nurse to start ASA on DC but it is not on DC papers or in the DC summary She will discuss this with the MD on her f/u appmt As of right now she is not taking the ASA Her diet is consistent No s/s of bleeding now or when her INR was 4.5  Decrease Coumadin to 5mg  on Mon and Fri and continue the 7.5 mg the other days.   Return 07/19/14 at 3:00pm for lab and 3:15pm for Coumadin clinic appointment

## 2014-07-19 ENCOUNTER — Ambulatory Visit (HOSPITAL_BASED_OUTPATIENT_CLINIC_OR_DEPARTMENT_OTHER): Payer: BC Managed Care – PPO | Admitting: Pharmacist

## 2014-07-19 ENCOUNTER — Other Ambulatory Visit (HOSPITAL_BASED_OUTPATIENT_CLINIC_OR_DEPARTMENT_OTHER): Payer: BC Managed Care – PPO

## 2014-07-19 DIAGNOSIS — I82402 Acute embolism and thrombosis of unspecified deep veins of left lower extremity: Secondary | ICD-10-CM | POA: Diagnosis not present

## 2014-07-19 DIAGNOSIS — Z7901 Long term (current) use of anticoagulants: Secondary | ICD-10-CM

## 2014-07-19 DIAGNOSIS — I82409 Acute embolism and thrombosis of unspecified deep veins of unspecified lower extremity: Secondary | ICD-10-CM

## 2014-07-19 LAB — PROTIME-INR
INR: 3.2 (ref 2.00–3.50)
PROTIME: 38.4 s — AB (ref 10.6–13.4)

## 2014-07-19 LAB — POCT INR: INR: 3.2

## 2014-07-19 NOTE — Progress Notes (Signed)
INR just above goal today at 3.2 (Goal 2.5-3.0) Pt is doing well with no complaints Pt reports no unusual bleeding or bruising No missed or extra doses Pt took coumadin as instructed with slightly decreased dose of 7.5 daily except for 5 mg on Tues/Fri No medication or diet changes With INR close to 3 I will make no changes as [name] is having no issues regarding coagulation and it is ideal to have her INR closer to upper end of goal range with her risk category. Plan: No changes Continue Coumadin 5mg  on Tues and Fri and the 7.5 mg the other days.  Return 08/02/14 at 3:30pm for lab and 3:45pm for Coumadin clinic appointment

## 2014-07-19 NOTE — Patient Instructions (Addendum)
INR just above goal No changes Continue Coumadin to 5mg  on Tues and Fri and continue the 7.5 mg the other days.  Return 08/02/14 at 3:30pm for lab and 3:45pm for Coumadin clinic appointment

## 2014-07-24 NOTE — Progress Notes (Signed)
Cardiology Office Note   Date:  07/25/2014   ID:  Shirley [name], DOB [birth year], MRN [id number]  PCP:  Merrilee Seashore, MD  Cardiologist:  Dr. Lauree Chandler     Chief Complaint  Patient presents with  . Coronary Artery Disease     History of Present Illness: Shirley [name] is a 74 y.o. female with a hx of CAD s/p inferior STEMI 01/2014 (c/b VF arrest >> defib in ED) treated with BMS to RCA, HTN, DVT.  She is on lifelong Coumadin.  She has been maintained on Plavix + Coumadin.    I saw her 3/22 for chest pain that was felt to be MSK.  Conservative management was recommended. She was then admitted 3/29-3/30 with chest pain.  She was seen by Dr. Kirk Ruths. Her pain was again felt to be MSK.  CEs remained negative.  Conservative measures were recommended (Tylenol, etc).    Since DC, she is doing well. She is still attending the program at the [facility].  She denies further chest pain.  She denies significant dyspnea. No orthopnea, PND.  She has chronic LE edema without changes.  No syncope.    Studies/Reports Reviewed Today:  Echo 01/24/14 - Mild concentric hypertrophy. EF 55-60%. Wall motion was normal - Aortic valve: There was trivial regurgitation. - Ascending aorta: The ascending aorta was mildly dilated. - Mitral valve: Calcified annulus.  Cardiac cath/PCI 01/24/14 Left main: No obstructive disease.   Left Anterior Descending Artery:  No obstructive disease.   Circumflex Artery:  No obstructive disease.   Right Coronary Artery: Moderate caliber co-dominant vessel with sub-totally occluded proximal vessel.  Left Ventricular Angiogram: LVEF=50% PCI Note: 3.0 x 16 mm Rebel BMS to proximal RCA.    Past Medical History  Diagnosis Date  . Hypertension   . Hemorrhoid   . GERD (gastroesophageal reflux disease)   . Adenomatous polyps   . DVT (deep venous thrombosis) 2003    lower extremity DVT with questionable recurrence in 2005. Treated with 1.5 year  course of coumadin.  [name] History of TIAs     2004 - no documentation available. 09/2010 -  was not on aspirin or statin therapy prior to event.  . Endometrial thickening on ultra sound 07/2009    path showing Fragments of benign endocervical and squamous mucosa. No dysplasia or malignancy // Followed by Dr. [name]  . Coronary artery disease, non-occlusive 11/2003; 01/2014    LHC (11/2003) - LAD diffuse 30% stenosis of mid-vessel. RCA with 30% stenosis of proximal vessel - by Dr. Dannielle Burn. // 2D Echo (04/2006) -  LV EF 65%.  Mild LVH. No  wall motion abnormalities.  Mild diastolic dysfunction. 2015 with STEMI- stent palced  . Prediabetes DX: 09/2010    HgA1c 6.1  . Stroke   . Syncope 12/18/2013  . Hx of myocardial infarction less than 8 weeks 01/24/14  . Hyperlipidemia LDL goal <70     Past Surgical History  Procedure Laterality Date  . Tubal ligation    . Knee surgery  1996    Left knee arthroscopy.  . Colonoscopy w/ polypectomy  11/01/2007    8 mm rectal adenoma, hemorrhoids  . Rotator cuff repair      right.  . Coronary angioplasty with stent placement  01/24/14    Inf. STEMI, BMS to RCA  . Left heart catheterization with coronary angiogram N/A 01/24/2014    Procedure: LEFT HEART CATHETERIZATION WITH CORONARY ANGIOGRAM;  Surgeon: Burnell Blanks, MD;  Location:  [facility];  Service: Cardiovascular;  Laterality: N/A;     Current Outpatient Prescriptions  Medication Sig Dispense Refill  . acetaminophen (TYLENOL) 500 MG tablet Take 500 mg by mouth every 6 (six) hours as needed for mild pain.     [name] atorvastatin (LIPITOR) 80 MG tablet Take 1 tablet (80 mg total) by mouth daily at 6 PM. 30 tablet 5  . clopidogrel (PLAVIX) 75 MG tablet Take 1 tablet (75 mg total) by mouth daily with breakfast. 30 tablet 11  . lisinopril-hydrochlorothiazide (PRINZIDE,ZESTORETIC) 10-12.5 MG per tablet     . metoprolol succinate (TOPROL-XL) 25 MG 24 hr tablet TAKE 1 TABLET (25 MG TOTAL) BY MOUTH  DAILY. TAKE WITH OR IMMEDIATELY FOLLOWING A MEAL. 30 tablet 6  . Multiple Vitamin (MULTIVITAMIN WITH MINERALS) TABS Take 1 tablet by mouth every morning.    . nitroGLYCERIN (NITROSTAT) 0.4 MG SL tablet Place 1 tablet (0.4 mg total) under the tongue every 5 (five) minutes x 3 doses as needed for chest pain. 25 tablet 12  . warfarin (COUMADIN) 5 MG tablet Take 7.5 mg (1.5 tablet) by mouth daily or as instructed by the coumadin clinic (Patient taking differently: Take 5-10 mg by mouth daily. Take 2 tablets on Thursday, 1 tablet on Monday, none on Friday then take 1 and 1/2 tablets all the other days) 45 tablet 3   No current facility-administered medications for this visit.    Allergies:   Review of patient's allergies indicates no known allergies.    Social History:  The patient  reports that she has never smoked. She has never used smokeless tobacco. She reports that she does not drink alcohol or use illicit drugs.   Family History:  The patient's family history includes Alzheimer's disease in her maternal grandmother; Angina in her father; Dementia in her mother; Diabetes in her mother; Heart attack in her father; Heart disease in her brother and mother; Hyperlipidemia in her mother; Liver cancer in her sister; Thyroid cancer in her sister. There is no history of Stroke.    ROS:   Please see the history of present illness.   Review of Systems  Musculoskeletal: Positive for joint pain.  All other systems reviewed and are negative.    PHYSICAL EXAM: VS:  BP 130/60 mmHg  Pulse 63  Ht 5\' 3"  (1.6 m)  Wt 262 lb 12.8 oz (119.205 kg)  BMI 46.56 kg/m2    Wt Readings from Last 3 Encounters:  07/25/14 262 lb 12.8 oz (119.205 kg)  07/03/14 258 lb 9.6 oz (117.3 kg)  06/26/14 265 lb (120.203 kg)     GEN: Well nourished, well developed, in no acute distress HEENT: normal Neck: no JVD, no masses Cardiac:  Normal S1/S2, RRR; no murmur ,  no rubs or gallops,  1+ bilateral LE edema  Chest: No  tenderness to palpation Respiratory:  clear to auscultation bilaterally, no wheezing, rhonchi or rales. GI: soft, nontender, nondistended, + BS MS: no deformity or atrophy Skin: warm and dry  Neuro:  CNs II-XII intact, Strength and sensation are intact Psych: Normal affect   EKG:  EKG is ordered today.  It demonstrates:   NSR, HR 63, 1st degree AVB, PR 238, LAD, no changes   Recent Labs: 07/03/2014: ALT 21; BUN 9; Creatinine 0.63; Hemoglobin 11.5*; Platelets 206; Potassium 3.7; Sodium 138    Lipid Panel    Component Value Date/Time   CHOL 107 03/19/2014 0745   TRIG 110.0 03/19/2014 0745   HDL 24.40* 03/19/2014  0745   CHOLHDL 4 03/19/2014 0745   VLDL 22.0 03/19/2014 0745   LDLCALC 61 03/19/2014 0745      ASSESSMENT AND PLAN:  Intercostal pain Pain is much improved.  This continues to be MSK from CPR at the time of her MI/VF arrest.  As noted, her pain is essentially resolved.  No further workup needed.   Coronary artery disease involving native coronary artery of native heart without angina pectoris No angina. Continue Plavix in addition to Coumadin, statin, beta blocker, ACE inhibitor.  Consider changing Plavix to ASA > 12 mos after her MI >> PCI.  Essential hypertension Controlled.   Hyperlipidemia Continue statin.   DVT (deep venous thrombosis), bilateral  She remains on Coumadin.   Current medicines are reviewed at length with the patient today.  The patient does not have concerns regarding medicines.  The following changes have been made:    None    Labs/ tests ordered today include:  Orders Placed This Encounter  Procedures  . EKG 12-Lead    Disposition:   FU Dr. Lauree Chandler 11/2014.  Signed, Versie Starks, MHS 07/25/2014 5:04 PM    [facility] [place], [place], [place]  [address] Phone: [phone]; Fax: [phone]

## 2014-07-25 ENCOUNTER — Ambulatory Visit (INDEPENDENT_AMBULATORY_CARE_PROVIDER_SITE_OTHER): Payer: BC Managed Care – PPO | Admitting: Physician Assistant

## 2014-07-25 ENCOUNTER — Encounter: Payer: Self-pay | Admitting: Physician Assistant

## 2014-07-25 VITALS — BP 130/60 | HR 63 | Ht 63.0 in | Wt 262.8 lb

## 2014-07-25 DIAGNOSIS — R0782 Intercostal pain: Secondary | ICD-10-CM | POA: Diagnosis not present

## 2014-07-25 DIAGNOSIS — I2510 Atherosclerotic heart disease of native coronary artery without angina pectoris: Secondary | ICD-10-CM | POA: Diagnosis not present

## 2014-07-25 DIAGNOSIS — E785 Hyperlipidemia, unspecified: Secondary | ICD-10-CM

## 2014-07-25 DIAGNOSIS — I10 Essential (primary) hypertension: Secondary | ICD-10-CM

## 2014-07-25 DIAGNOSIS — I82403 Acute embolism and thrombosis of unspecified deep veins of lower extremity, bilateral: Secondary | ICD-10-CM

## 2014-07-25 NOTE — Patient Instructions (Signed)
Medication Instructions:  Continue your same medications.  Labwork: None   Testing/Procedures: None   Follow-Up: Dr. Lauree Chandler in 11/2014.  You will get a card in the mail soon to schedule.  Any Other Special Instructions Will Be Listed Below (If Applicable).

## 2014-07-30 ENCOUNTER — Other Ambulatory Visit: Payer: Self-pay | Admitting: *Deleted

## 2014-07-30 MED ORDER — ATORVASTATIN CALCIUM 80 MG PO TABS: 80.0000 mg | ORAL_TABLET | Freq: Every day | ORAL | Status: DC

## 2014-07-30 NOTE — Telephone Encounter (Signed)
Rx refill sent to patient pharmacy   

## 2014-07-31 ENCOUNTER — Other Ambulatory Visit: Payer: Self-pay

## 2014-07-31 MED ORDER — ATORVASTATIN CALCIUM 80 MG PO TABS: 80.0000 mg | ORAL_TABLET | Freq: Every day | ORAL | Status: DC

## 2014-08-02 ENCOUNTER — Telehealth: Payer: Self-pay | Admitting: Pharmacist

## 2014-08-02 ENCOUNTER — Ambulatory Visit: Payer: BC Managed Care – PPO

## 2014-08-02 ENCOUNTER — Other Ambulatory Visit: Payer: BC Managed Care – PPO

## 2014-08-02 NOTE — Telephone Encounter (Signed)
Pt called [facility] re: today's appts.  She was out of town w/ school and did not make her appt today. She'd like to come next Wed 08/07/14. Kennith Center, Pharm.D., CPP 08/02/2014@3 :53 PM

## 2014-08-06 ENCOUNTER — Telehealth: Payer: Self-pay

## 2014-08-06 NOTE — Telephone Encounter (Signed)
Shirley [name], I have no idea. Can we touch base with the patient to see? Gerald Stabs

## 2014-08-06 NOTE — Telephone Encounter (Signed)
Called patient [facility]

## 2014-08-07 ENCOUNTER — Other Ambulatory Visit (HOSPITAL_BASED_OUTPATIENT_CLINIC_OR_DEPARTMENT_OTHER): Payer: BC Managed Care – PPO

## 2014-08-07 ENCOUNTER — Ambulatory Visit (HOSPITAL_BASED_OUTPATIENT_CLINIC_OR_DEPARTMENT_OTHER): Payer: BC Managed Care – PPO | Admitting: Pharmacist

## 2014-08-07 DIAGNOSIS — Z7901 Long term (current) use of anticoagulants: Secondary | ICD-10-CM | POA: Diagnosis not present

## 2014-08-07 DIAGNOSIS — I82402 Acute embolism and thrombosis of unspecified deep veins of left lower extremity: Secondary | ICD-10-CM

## 2014-08-07 DIAGNOSIS — I82409 Acute embolism and thrombosis of unspecified deep veins of unspecified lower extremity: Secondary | ICD-10-CM

## 2014-08-07 LAB — PROTIME-INR
INR: 2.5 (ref 2.00–3.50)
Protime: 30 Seconds — ABNORMAL HIGH (ref 10.6–13.4)

## 2014-08-07 LAB — POCT INR: INR: 2.5

## 2014-08-07 NOTE — Progress Notes (Signed)
Pt seen in clinic today INR=2.5 No med or diet changes Complains of chest soreness and pain down her left leg PCP states chest could still be sore from CPR compressions Leg pain does resolve with cold compresses  Continue Coumadin to 5mg  on Tue and Fri and continue the 7.5 mg the other days.   Return 08/21/14 at 3:30pm for lab and 3:45pm for Coumadin clinic appointment

## 2014-08-07 NOTE — Patient Instructions (Signed)
Continue Coumadin to 5mg  on Tue and Fri and continue the 7.5 mg the other days.  Return 08/21/14 at 3:30pm for lab and 3:45pm for Coumadin clinic appointment

## 2014-08-09 MED ORDER — LISINOPRIL-HYDROCHLOROTHIAZIDE 10-12.5 MG PO TABS: 1.0000 | ORAL_TABLET | Freq: Every day | ORAL | Status: DC

## 2014-08-09 NOTE — Telephone Encounter (Signed)
Spoke with pt's daughter and confirmed pt is taking lisinopril /HCTZ once daily. She does not know dosage.  States this has been filled by Paediatric nurse at [facility].  I told her I would contact [facility] and if dosage was same as we had listed I would refill. Daughter requests refill be sent to [facility].   I spoke with [facility] and they have pt taking Lisinopril /HCTZ 10-12.5 mg daily on list. This is what we have on her med list.  Will refill electronically.

## 2014-08-09 NOTE — Telephone Encounter (Signed)
Per refill team pt came into office on 5/3 requesting refill of lisinopril /HCTZ.  Pt left before dose could be clarified.   I placed call to pt to see how she was taking this medication and where she needed refill sent.  Left message to call back.

## 2014-08-11 ENCOUNTER — Emergency Department (HOSPITAL_COMMUNITY): Payer: BC Managed Care – PPO

## 2014-08-11 ENCOUNTER — Encounter (HOSPITAL_COMMUNITY): Payer: Self-pay | Admitting: *Deleted

## 2014-08-11 ENCOUNTER — Emergency Department (HOSPITAL_COMMUNITY)
Admission: EM | Admit: 2014-08-11 | Discharge: 2014-08-11 | Disposition: A | Discharge status: Home / Self Care | Payer: BC Managed Care – PPO | Attending: Emergency Medicine | Admitting: Emergency Medicine

## 2014-08-11 DIAGNOSIS — Z9861 Coronary angioplasty status: Secondary | ICD-10-CM | POA: Diagnosis not present

## 2014-08-11 DIAGNOSIS — Z79899 Other long term (current) drug therapy: Secondary | ICD-10-CM | POA: Diagnosis not present

## 2014-08-11 DIAGNOSIS — I252 Old myocardial infarction: Secondary | ICD-10-CM | POA: Insufficient documentation

## 2014-08-11 DIAGNOSIS — R531 Weakness: Secondary | ICD-10-CM | POA: Diagnosis present

## 2014-08-11 DIAGNOSIS — Z9889 Other specified postprocedural states: Secondary | ICD-10-CM | POA: Diagnosis not present

## 2014-08-11 DIAGNOSIS — Z86018 Personal history of other benign neoplasm: Secondary | ICD-10-CM | POA: Insufficient documentation

## 2014-08-11 DIAGNOSIS — Z86718 Personal history of other venous thrombosis and embolism: Secondary | ICD-10-CM | POA: Insufficient documentation

## 2014-08-11 DIAGNOSIS — I10 Essential (primary) hypertension: Secondary | ICD-10-CM | POA: Diagnosis not present

## 2014-08-11 DIAGNOSIS — Z8719 Personal history of other diseases of the digestive system: Secondary | ICD-10-CM | POA: Diagnosis not present

## 2014-08-11 DIAGNOSIS — I2510 Atherosclerotic heart disease of native coronary artery without angina pectoris: Secondary | ICD-10-CM | POA: Diagnosis not present

## 2014-08-11 DIAGNOSIS — M79675 Pain in left toe(s): Secondary | ICD-10-CM | POA: Diagnosis not present

## 2014-08-11 DIAGNOSIS — Z8673 Personal history of transient ischemic attack (TIA), and cerebral infarction without residual deficits: Secondary | ICD-10-CM | POA: Diagnosis not present

## 2014-08-11 DIAGNOSIS — E785 Hyperlipidemia, unspecified: Secondary | ICD-10-CM | POA: Diagnosis not present

## 2014-08-11 DIAGNOSIS — R200 Anesthesia of skin: Secondary | ICD-10-CM | POA: Insufficient documentation

## 2014-08-11 DIAGNOSIS — R0989 Other specified symptoms and signs involving the circulatory and respiratory systems: Secondary | ICD-10-CM | POA: Insufficient documentation

## 2014-08-11 DIAGNOSIS — R202 Paresthesia of skin: Secondary | ICD-10-CM | POA: Insufficient documentation

## 2014-08-11 LAB — DIFFERENTIAL
BASOS ABS: 0.1 10*3/uL (ref 0.0–0.1)
Basophils Relative: 1 % (ref 0–1)
EOS PCT: 3 % (ref 0–5)
Eosinophils Absolute: 0.4 10*3/uL (ref 0.0–0.7)
LYMPHS ABS: 6.8 10*3/uL — AB (ref 0.7–4.0)
LYMPHS PCT: 51 % — AB (ref 12–46)
Monocytes Absolute: 0.8 10*3/uL (ref 0.1–1.0)
Monocytes Relative: 6 % (ref 3–12)
NEUTROS ABS: 5.1 10*3/uL (ref 1.7–7.7)
NEUTROS PCT: 39 % — AB (ref 43–77)

## 2014-08-11 LAB — COMPREHENSIVE METABOLIC PANEL
ALT: 23 U/L (ref 14–54)
ANION GAP: 8 (ref 5–15)
AST: 21 U/L (ref 15–41)
Albumin: 3.1 g/dL — ABNORMAL LOW (ref 3.5–5.0)
Alkaline Phosphatase: 120 U/L (ref 38–126)
BILIRUBIN TOTAL: 0.6 mg/dL (ref 0.3–1.2)
BUN: 14 mg/dL (ref 6–20)
CHLORIDE: 99 mmol/L — AB (ref 101–111)
CO2: 26 mmol/L (ref 22–32)
Calcium: 8.5 mg/dL — ABNORMAL LOW (ref 8.9–10.3)
Creatinine, Ser: 0.72 mg/dL (ref 0.44–1.00)
GFR calc Af Amer: >60 mL/min (ref >60)
Glucose, Bld: 102 mg/dL — ABNORMAL HIGH (ref 70–99)
Potassium: 3.6 mmol/L (ref 3.5–5.1)
SODIUM: 133 mmol/L — AB (ref 135–145)
Total Protein: 6.6 g/dL (ref 6.5–8.1)

## 2014-08-11 LAB — I-STAT TROPONIN, ED: TROPONIN I, POC: 0 ng/mL (ref 0.00–0.08)

## 2014-08-11 LAB — APTT: aPTT: 39 seconds — ABNORMAL HIGH (ref 24–37)

## 2014-08-11 LAB — CBC
HEMATOCRIT: 37.6 % (ref 36.0–46.0)
Hemoglobin: 12.7 g/dL (ref 12.0–15.0)
MCH: 27.7 pg (ref 26.0–34.0)
MCHC: 33.8 g/dL (ref 30.0–36.0)
MCV: 82.1 fL (ref 78.0–100.0)
Platelets: 265 10*3/uL (ref 150–400)
RBC: 4.58 MIL/uL (ref 3.87–5.11)
RDW: 15 % (ref 11.5–15.5)
WBC: 13.2 10*3/uL — AB (ref 4.0–10.5)

## 2014-08-11 LAB — CBG MONITORING, ED: Glucose-Capillary: 93 mg/dL (ref 70–99)

## 2014-08-11 LAB — PROTIME-INR
INR: 1.88 — AB (ref 0.00–1.49)
PROTHROMBIN TIME: 21.8 s — AB (ref 11.6–15.2)

## 2014-08-11 MED ORDER — TRAMADOL HCL 50 MG PO TABS: 50.0000 mg | ORAL_TABLET | Freq: Four times a day (QID) | ORAL | Status: DC | PRN

## 2014-08-11 MED ORDER — TRAMADOL HCL 50 MG PO TABS
50.0000 mg | ORAL_TABLET | Freq: Once | ORAL | Status: AC
  Administered 2014-08-11: 50 mg via ORAL
  Filled 2014-08-11: qty 1

## 2014-08-11 NOTE — ED Notes (Signed)
Patient transported to MRI 

## 2014-08-11 NOTE — ED Notes (Signed)
Patient arrived with family member stating she was having numbness to her left leg and left side of face

## 2014-08-11 NOTE — Discharge Instructions (Signed)
Paresthesia Paresthesia is an abnormal burning or prickling sensation. This sensation is generally felt in the hands, arms, legs, or feet. However, it may occur in any part of the body. It is usually not painful. The feeling may be described as:  Tingling or numbness.  "Pins and needles."  Skin crawling.  Buzzing.  Limbs "falling asleep."  Itching. Most people experience temporary (transient) paresthesia at some time in their lives. CAUSES  Paresthesia may occur when you breathe too quickly (hyperventilation). It can also occur without any apparent cause. Commonly, paresthesia occurs when pressure is placed on a nerve. The feeling quickly goes away once the pressure is removed. For some people, however, paresthesia is a long-lasting (chronic) condition caused by an underlying disorder. The underlying disorder may be:  A traumatic, direct injury to nerves. Examples include a:  Broken (fractured) neck.  Fractured skull.  A disorder affecting the brain and spinal cord (central nervous system). Examples include:  Transverse myelitis.  Encephalitis.  Transient ischemic attack.  Multiple sclerosis.  Stroke.  Tumor or blood vessel problems, such as an arteriovenous malformation pressing against the brain or spinal cord.  A condition that damages the peripheral nerves (peripheral neuropathy). Peripheral nerves are not part of the brain and spinal cord. These conditions include:  Diabetes.  Peripheral vascular disease.  Nerve entrapment syndromes, such as carpal tunnel syndrome.  Shingles.  Hypothyroidism.  Vitamin B12 deficiencies.  Alcoholism.  Heavy metal poisoning (lead, arsenic).  Rheumatoid arthritis.  Systemic lupus erythematosus. DIAGNOSIS  Your caregiver will attempt to find the underlying cause of your paresthesia. Your caregiver may:  Take your medical history.  Perform a physical exam.  Order various lab tests.  Order imaging tests. TREATMENT    Treatment for paresthesia depends on the underlying cause. HOME CARE INSTRUCTIONS  Avoid drinking alcohol.  You may consider massage or acupuncture to help relieve your symptoms.  Keep all follow-up appointments as directed by your caregiver. SEEK IMMEDIATE MEDICAL CARE IF:   You feel weak.  You have trouble walking or moving.  You have problems with speech or vision.  You feel confused.  You cannot control your bladder or bowel movements.  You feel numbness after an injury.  You faint.  Your burning or prickling feeling gets worse when walking.  You have pain, cramps, or dizziness.  You develop a rash. MAKE SURE YOU:  Understand these instructions.  Will watch your condition.  Will get help right away if you are not doing well or get worse. Document Released: 03/12/2002 Document Revised: 06/14/2011 Document Reviewed: 12/11/2010 [facility] Patient Information 2015 [place], [place]. This information is not intended to replace advice given to you by your health care provider. Make sure you discuss any questions you have with your health care provider.  Tramadol tablets What is this medicine? TRAMADOL (TRA ma dole) is a pain reliever. It is used to treat moderate to severe pain in adults. This medicine may be used for other purposes; ask your health care provider or pharmacist if you have questions. COMMON BRAND NAME(S): Ultram What should I tell my health care provider before I take this medicine? They need to know if you have any of these conditions: -brain tumor -depression -drug abuse or addiction -head injury -if you frequently drink alcohol containing drinks -kidney disease or trouble passing urine -liver disease -lung disease, asthma, or breathing problems -seizures or epilepsy -suicidal thoughts, plans, or attempt; a previous suicide attempt by you or a family member -an unusual or allergic  reaction to tramadol, codeine, other medicines, foods, dyes, or  preservatives -pregnant or trying to get pregnant -breast-feeding How should I use this medicine? Take this medicine by mouth with a full glass of water. Follow the directions on the prescription label. If the medicine upsets your stomach, take it with food or milk. Do not take more medicine than you are told to take. Talk to your pediatrician regarding the use of this medicine in children. Special care may be needed. Overdosage: If you think you have taken too much of this medicine contact a poison control center or emergency room at once. NOTE: This medicine is only for you. Do not share this medicine with others. What if I miss a dose? If you miss a dose, take it as soon as you can. If it is almost time for your next dose, take only that dose. Do not take double or extra doses. What may interact with this medicine? Do not take this medicine with any of the following medications: -MAOIs like Carbex, Eldepryl, Marplan, Nardil, and Parnate This medicine may also interact with the following medications: -alcohol or medicines that contain alcohol -antihistamines -benzodiazepines -bupropion -carbamazepine or oxcarbazepine -clozapine -cyclobenzaprine -digoxin -furazolidone -linezolid -medicines for depression, anxiety, or psychotic disturbances -medicines for migraine headache like almotriptan, eletriptan, frovatriptan, naratriptan, rizatriptan, sumatriptan, zolmitriptan -medicines for pain like pentazocine, buprenorphine, butorphanol, meperidine, nalbuphine, and propoxyphene -medicines for sleep -muscle relaxants -naltrexone -phenobarbital -phenothiazines like perphenazine, thioridazine, chlorpromazine, mesoridazine, fluphenazine, prochlorperazine, promazine, and trifluoperazine -procarbazine -warfarin This list may not describe all possible interactions. Give your health care provider a list of all the medicines, herbs, non-prescription drugs, or dietary supplements you use. Also tell  them if you smoke, drink alcohol, or use illegal drugs. Some items may interact with your medicine. What should I watch for while using this medicine? Tell your doctor or health care professional if your pain does not go away, if it gets worse, or if you have new or a different type of pain. You may develop tolerance to the medicine. Tolerance means that you will need a higher dose of the medicine for pain relief. Tolerance is normal and is expected if you take this medicine for a long time. Do not suddenly stop taking your medicine because you may develop a severe reaction. Your body becomes used to the medicine. This does NOT mean you are addicted. Addiction is a behavior related to getting and using a drug for a non-medical reason. If you have pain, you have a medical reason to take pain medicine. Your doctor will tell you how much medicine to take. If your doctor wants you to stop the medicine, the dose will be slowly lowered over time to avoid any side effects. You may get drowsy or dizzy. Do not drive, use machinery, or do anything that needs mental alertness until you know how this medicine affects you. Do not stand or sit up quickly, especially if you are an older patient. This reduces the risk of dizzy or fainting spells. Alcohol can increase or decrease the effects of this medicine. Avoid alcoholic drinks. You may have constipation. Try to have a bowel movement at least every 2 to 3 days. If you do not have a bowel movement for 3 days, call your doctor or health care professional. Your mouth may get dry. Chewing sugarless gum or sucking hard candy, and drinking plenty of water may help. Contact your doctor if the problem does not go away or is severe. What side effects may  I notice from receiving this medicine? Side effects that you should report to your doctor or health care professional as soon as possible: -allergic reactions like skin rash, itching or hives, swelling of the face, lips, or  tongue -breathing difficulties, wheezing -confusion -itching -light headedness or fainting spells -redness, blistering, peeling or loosening of the skin, including inside the mouth -seizures Side effects that usually do not require medical attention (report to your doctor or health care professional if they continue or are bothersome): -constipation -dizziness -drowsiness -headache -nausea, vomiting This list may not describe all possible side effects. Call your doctor for medical advice about side effects. You may report side effects to FDA at 1-800-FDA-1088. Where should I keep my medicine? Keep out of the reach of children. Store at room temperature between 15 and 30 degrees C (59 and 86 degrees F). Keep container tightly closed. Throw away any unused medicine after the expiration date. NOTE: This sheet is a summary. It may not cover all possible information. If you have questions about this medicine, talk to your doctor, pharmacist, or health care provider.  2015, [employer] Standard. (2009-12-03 11:55:44)

## 2014-08-11 NOTE — ED Provider Notes (Signed)
CSN: [id number]     Arrival date & time 08/11/14  0056 History  This chart was scribed for Delora Fuel, MD by Evelene Croon, ED Scribe. This patient was seen in room B16C/B16C and the patient's care was started 2:21 AM.    Chief Complaint  Patient presents with  . Leg Pain  . Weakness   The history is provided by the patient and a relative (Daughter). No language interpreter was used.    HPI Comments:  Shirley [name] is a 74 y.o. female who presents to the Emergency Department complaining of acute onset left side facial numbness, left sided weakness and 3/10 pain to her left great toe. Pt states she had no symptoms when she went to bed around 2130/2200 last night. Pt's daughter states she woke up around 0000 complaining of numbness and weakness. Pt ambulates with a walker; denies acute difficulty ambulating. She has taken tylenol around 2030 with moderate relief of pain.Pt is on coumadin; she is currently being treated for a lower extremity DVT.     Past Medical History  Diagnosis Date  . Hypertension   . Hemorrhoid   . GERD (gastroesophageal reflux disease)   . Adenomatous polyps   . DVT (deep venous thrombosis) 2003    lower extremity DVT with questionable recurrence in 2005. Treated with 1.5 year course of coumadin.  [name] History of TIAs     2004 - no documentation available. 09/2010 -  was not on aspirin or statin therapy prior to event.  . Endometrial thickening on ultra sound 07/2009    path showing Fragments of benign endocervical and squamous mucosa. No dysplasia or malignancy // Followed by Dr. [name]  . Coronary artery disease, non-occlusive 11/2003; 01/2014    LHC (11/2003) - LAD diffuse 30% stenosis of mid-vessel. RCA with 30% stenosis of proximal vessel - by Dr. Dannielle Burn. // 2D Echo (04/2006) -  LV EF 65%.  Mild LVH. No  wall motion abnormalities.  Mild diastolic dysfunction. 2015 with STEMI- stent palced  . Prediabetes DX: 09/2010    HgA1c 6.1  . Stroke   . Syncope  12/18/2013  . Hx of myocardial infarction less than 8 weeks 01/24/14  . Hyperlipidemia LDL goal <70    Past Surgical History  Procedure Laterality Date  . Tubal ligation    . Knee surgery  1996    Left knee arthroscopy.  . Colonoscopy w/ polypectomy  11/01/2007    8 mm rectal adenoma, hemorrhoids  . Rotator cuff repair      right.  . Coronary angioplasty with stent placement  01/24/14    Inf. STEMI, BMS to RCA  . Left heart catheterization with coronary angiogram N/A 01/24/2014    Procedure: LEFT HEART CATHETERIZATION WITH CORONARY ANGIOGRAM;  Surgeon: Burnell Blanks, MD;  Location: [facility] CATH LAB;  Service: Cardiovascular;  Laterality: N/A;   Family History  Problem Relation Age of Onset  . Thyroid cancer Sister   . Liver cancer Sister   . Diabetes Mother   . Hyperlipidemia Mother   . Heart disease Mother   . Dementia Mother   . Angina Father   . Heart disease Brother     x 2 in 53s yo  . Alzheimer's disease Maternal Grandmother   . Heart attack Father   . Stroke Neg Hx    History  Substance Use Topics  . Smoking status: Never Smoker   . Smokeless tobacco: Never Used  . Alcohol Use: No   OB History  No data available     Review of Systems  Respiratory: Negative for shortness of breath.   Cardiovascular: Negative for chest pain.  Musculoskeletal: Positive for myalgias and arthralgias.  Neurological: Positive for weakness and numbness.  All other systems reviewed and are negative.     Allergies  Review of patient's allergies indicates no known allergies.  Home Medications   Prior to Admission medications   Medication Sig Start Date End Date Taking? Authorizing Provider  acetaminophen (TYLENOL) 500 MG tablet Take 500 mg by mouth every 6 (six) hours as needed for mild pain.    Yes Historical Provider, MD  atorvastatin (LIPITOR) 80 MG tablet Take 1 tablet (80 mg total) by mouth daily at 6 PM. 07/31/14  Yes Burnell Blanks, MD  clopidogrel (PLAVIX)  75 MG tablet Take 1 tablet (75 mg total) by mouth daily with breakfast. 01/27/14  Yes Brett Canales, PA-C  lisinopril-hydrochlorothiazide (PRINZIDE,ZESTORETIC) 10-12.5 MG per tablet Take 1 tablet by mouth daily. 08/09/14  Yes Burnell Blanks, MD  metoprolol succinate (TOPROL-XL) 25 MG 24 hr tablet TAKE 1 TABLET (25 MG TOTAL) BY MOUTH DAILY. TAKE WITH OR IMMEDIATELY FOLLOWING A MEAL. 05/28/14  Yes Burnell Blanks, MD  Multiple Vitamin (MULTIVITAMIN WITH MINERALS) TABS Take 1 tablet by mouth every morning.   Yes Historical Provider, MD  warfarin (COUMADIN) 10 MG tablet Take 10-15 mg by mouth See admin instructions. Take 1 tablet on Tuesday and Friday. Take 1.5 tablets all other days.   Yes Historical Provider, MD  nitroGLYCERIN (NITROSTAT) 0.4 MG SL tablet Place 1 tablet (0.4 mg total) under the tongue every 5 (five) minutes x 3 doses as needed for chest pain. 01/27/14   Brett Canales, PA-C  warfarin (COUMADIN) 5 MG tablet Take 7.5 mg (1.5 tablet) by mouth daily or as instructed by the coumadin clinic Patient not taking: Reported on 08/11/2014 04/09/14   Truitt Merle, MD   BP 156/60 mmHg  Pulse 64  Temp(Src) 98.2 F (36.8 C) (Oral)  Resp 14  Ht 5\' 3"  (1.6 m)  Wt 264 lb (119.75 kg)  BMI 46.78 kg/m2  SpO2 97% Physical Exam  Constitutional: She is oriented to person, place, and time. She appears well-developed and well-nourished.  HENT:  Head: Normocephalic and atraumatic.  Eyes: Conjunctivae are normal. Pupils are equal, round, and reactive to light.  Neck: Normal range of motion. Neck supple. No JVD present.  Right carotid bruit present    Cardiovascular: Normal rate, regular rhythm and normal heart sounds.   No murmur heard. Pulmonary/Chest: Effort normal and breath sounds normal. She has no wheezes. She has no rales. She exhibits no tenderness.  Abdominal: Soft. Bowel sounds are normal. She exhibits no distension and no mass. There is no tenderness.  Musculoskeletal: Normal range of  motion. She exhibits edema.  2+ edema with venous stasis changes present left worse than right No swelling or TTP to left first toe    Lymphadenopathy:    She has no cervical adenopathy.  Neurological: She is alert and oriented to person, place, and time. No cranial nerve deficit. Coordination normal.  Decreased pin prick sensation to left face-cheek and chin but not forehead. Cranial nerves otherwise intact No protonator drift  Muscle strength 5/5 in all muscles  No sensory deficits in arms or legs  Nml finger to nose test  Skin: Skin is warm and dry. No rash noted.  Psychiatric: She has a normal mood and affect. Her behavior is normal. Judgment and  thought content normal.  Nursing note and vitals reviewed.   ED Course  Procedures   DIAGNOSTIC STUDIES:  Oxygen Saturation is 97% on RA, normal by my interpretation.    COORDINATION OF CARE:  2:29 AM Pt and family updated with results. Will order MRI. Discussed treatment plan with pt and family at bedside and they agreed to plan.  Labs Review Results for orders placed or performed during the hospital encounter of 08/11/14  Protime-INR  Result Value Ref Range   Prothrombin Time 21.8 (H) 11.6 - 15.2 seconds   INR 1.88 (H) 0.00 - 1.49  APTT  Result Value Ref Range   aPTT 39 (H) 24 - 37 seconds  CBC  Result Value Ref Range   WBC 13.2 (H) 4.0 - 10.5 K/uL   RBC 4.58 3.87 - 5.11 MIL/uL   Hemoglobin 12.7 12.0 - 15.0 g/dL   HCT 37.6 36.0 - 46.0 %   MCV 82.1 78.0 - 100.0 fL   MCH 27.7 26.0 - 34.0 pg   MCHC 33.8 30.0 - 36.0 g/dL   RDW 15.0 11.5 - 15.5 %   Platelets 265 150 - 400 K/uL  Differential  Result Value Ref Range   Neutrophils Relative % 39 (L) 43 - 77 %   Lymphocytes Relative 51 (H) 12 - 46 %   Monocytes Relative 6 3 - 12 %   Eosinophils Relative 3 0 - 5 %   Basophils Relative 1 0 - 1 %   Neutro Abs 5.1 1.7 - 7.7 K/uL   Lymphs Abs 6.8 (H) 0.7 - 4.0 K/uL   Monocytes Absolute 0.8 0.1 - 1.0 K/uL   Eosinophils  Absolute 0.4 0.0 - 0.7 K/uL   Basophils Absolute 0.1 0.0 - 0.1 K/uL   WBC Morphology ABSOLUTE LYMPHOCYTOSIS   Comprehensive metabolic panel  Result Value Ref Range   Sodium 133 (L) 135 - 145 mmol/L   Potassium 3.6 3.5 - 5.1 mmol/L   Chloride 99 (L) 101 - 111 mmol/L   CO2 26 22 - 32 mmol/L   Glucose, Bld 102 (H) 70 - 99 mg/dL   BUN 14 6 - 20 mg/dL   Creatinine, Ser 0.72 0.44 - 1.00 mg/dL   Calcium 8.5 (L) 8.9 - 10.3 mg/dL   Total Protein 6.6 6.5 - 8.1 g/dL   Albumin 3.1 (L) 3.5 - 5.0 g/dL   AST 21 15 - 41 U/L   ALT 23 14 - 54 U/L   Alkaline Phosphatase 120 38 - 126 U/L   Total Bilirubin 0.6 0.3 - 1.2 mg/dL   GFR calc non Af Amer >60 >60 mL/min   GFR calc Af Amer >60 >60 mL/min   Anion gap 8 5 - 15  I-stat troponin, ED (not at [facility])  Result Value Ref Range   Troponin i, poc 0.00 0.00 - 0.08 ng/mL   Comment 3          CBG monitoring, ED  Result Value Ref Range   Glucose-Capillary 93 70 - 99 mg/dL    Imaging Review Ct Head (brain) Wo Contrast  08/11/2014   CLINICAL DATA:  Left facial and left lower extremity paresthesias  EXAM: CT HEAD WITHOUT CONTRAST  TECHNIQUE: Contiguous axial images were obtained from the base of the skull through the vertex without intravenous contrast.  COMPARISON:  12/18/2013  FINDINGS: There is no intracranial hemorrhage, mass or evidence of acute infarction. There is moderate generalized atrophy. There is mild chronic microvascular ischemic change. There is no significant extra-axial  fluid collection.  No acute intracranial findings are evident.  IMPRESSION: Moderate generalized atrophy and mild chronic ischemic small vessel disease. No acute findings.   Electronically Signed   By: Andreas Newport M.D.   On: 08/11/2014 02:10   Mr Brain Wo Contrast  08/11/2014   CLINICAL DATA:  Initial evaluation for acute onset left facial numbness, left-sided weakness. History of hypertension, DVT, CVA, TIAs.  EXAM: MRI HEAD WITHOUT CONTRAST  TECHNIQUE: Multiplanar,  multiecho pulse sequences of the brain and surrounding structures were obtained without intravenous contrast.  COMPARISON:  Prior CT from earlier the same day.  FINDINGS: The CSF containing spaces are within normal limits for patient age. Mild patchy T2/FLAIR hyperintensity present within the periventricular deep white matter both cerebral hemispheres, nonspecific. No mass lesion, midline shift, or extra-axial fluid collection. Ventricles are normal in size without evidence of hydrocephalus.  No diffusion-weighted signal abnormality is identified to suggest acute intracranial infarct. Gray-white matter differentiation is maintained. Normal flow voids are seen within the intracranial vasculature. No intracranial hemorrhage identified.  The cervicomedullary junction is normal. Pituitary gland is within normal limits. Pituitary stalk is midline. The globes and optic nerves demonstrate a normal appearance with normal signal intensity.  The bone marrow signal intensity is normal. Calvarium is intact. Visualized upper cervical spine is within normal limits.  Scalp soft tissues are unremarkable.  Paranasal sinuses are clear.  No mastoid effusion.  IMPRESSION: 1. No acute intracranial infarct or other abnormality identified. 2. Stable white matter changes as compared to prior brain MRI from 2012. Findings are nonspecific, but most likely related to mild chronic small vessel ischemic disease.   Electronically Signed   By: Jeannine Boga M.D.   On: 08/11/2014 04:33     MDM   Final diagnoses:  Numbness and tingling of left side of face  Pain of left great toe    Left toe pain of uncertain cause. Left facial numbness of uncertain cause. She is sent for CT scan which is negative and sent for MRI scan which is negative. Daughter asked about possible Bell's palsy. I did a sprain that. Sometimes a prodrome with numbness but without motor findings, Bell's palsy cannot be diagnosed. Advised to return to the ED or see  her PCP if she does develop any facial weakness. She is sad discharged with a prescription for Ultram to take as needed for her foot pain.  I personally performed the services described in this documentation, which was scribed in my presence. The recorded information has been reviewed and is accurate.        Delora Fuel, MD 18/29/93 [id number]

## 2014-08-12 ENCOUNTER — Telehealth: Payer: Self-pay | Admitting: Hematology

## 2014-08-12 LAB — I-STAT CHEM 8, ED
BUN: 14 mg/dL (ref 6–20)
Calcium, Ion: 1.14 mmol/L (ref 1.13–1.30)
Chloride: 99 mmol/L — ABNORMAL LOW (ref 101–111)
Creatinine, Ser: 0.8 mg/dL (ref 0.44–1.00)
Glucose, Bld: 102 mg/dL — ABNORMAL HIGH (ref 70–99)
HCT: 39 % (ref 36.0–46.0)
Hemoglobin: 13.3 g/dL (ref 12.0–15.0)
Potassium: 3.7 mmol/L (ref 3.5–5.1)
Sodium: 137 mmol/L (ref 135–145)
TCO2: 23 mmol/L (ref 0–100)

## 2014-08-12 LAB — PATHOLOGIST SMEAR REVIEW

## 2014-08-12 NOTE — Telephone Encounter (Signed)
per Kolleen to sch pt CC-pt aware °

## 2014-08-21 ENCOUNTER — Ambulatory Visit (HOSPITAL_BASED_OUTPATIENT_CLINIC_OR_DEPARTMENT_OTHER): Payer: BC Managed Care – PPO | Admitting: Pharmacist

## 2014-08-21 ENCOUNTER — Other Ambulatory Visit (HOSPITAL_BASED_OUTPATIENT_CLINIC_OR_DEPARTMENT_OTHER): Payer: BC Managed Care – PPO

## 2014-08-21 DIAGNOSIS — Z7901 Long term (current) use of anticoagulants: Secondary | ICD-10-CM

## 2014-08-21 DIAGNOSIS — I82409 Acute embolism and thrombosis of unspecified deep veins of unspecified lower extremity: Secondary | ICD-10-CM

## 2014-08-21 LAB — PROTIME-INR
INR: 2.7 (ref 2.00–3.50)
PROTIME: 32.4 s — AB (ref 10.6–13.4)

## 2014-08-21 LAB — POCT INR: INR: 2.7

## 2014-08-21 NOTE — Progress Notes (Signed)
INR within goal today. No missed or extra coumadin doses. No changes in diet or medications. No unusual bruising. No bleeding noted. No s/s of clotting noted. Pt stated her co-worker had the flu. Pt feels achy with chills. She also feels like she has a warm throat and runny nose. She plans to follow up with her primary care MD (through her daughter) today. Continue Coumadin 5mg  on Tue and Fri and continue 7.5mg  the other days.   Return 09/11/14 at 3:30pm for lab and 3:45pm for Coumadin clinic.

## 2014-08-21 NOTE — Patient Instructions (Addendum)
Continue Coumadin 5mg  on Tue and Fri and continue 7.5mg  the other days.   Return 09/11/14 at 3:30pm for lab and 3:45pm for Coumadin clinic.

## 2014-08-31 ENCOUNTER — Other Ambulatory Visit: Payer: Self-pay | Admitting: Hematology

## 2014-09-11 ENCOUNTER — Other Ambulatory Visit (HOSPITAL_BASED_OUTPATIENT_CLINIC_OR_DEPARTMENT_OTHER): Payer: BC Managed Care – PPO

## 2014-09-11 ENCOUNTER — Ambulatory Visit (HOSPITAL_BASED_OUTPATIENT_CLINIC_OR_DEPARTMENT_OTHER): Payer: BC Managed Care – PPO | Admitting: Pharmacist

## 2014-09-11 DIAGNOSIS — I82409 Acute embolism and thrombosis of unspecified deep veins of unspecified lower extremity: Secondary | ICD-10-CM

## 2014-09-11 DIAGNOSIS — I82402 Acute embolism and thrombosis of unspecified deep veins of left lower extremity: Secondary | ICD-10-CM | POA: Diagnosis not present

## 2014-09-11 DIAGNOSIS — Z7901 Long term (current) use of anticoagulants: Secondary | ICD-10-CM | POA: Diagnosis not present

## 2014-09-11 LAB — PROTIME-INR
INR: 2.6 (ref 2.00–3.50)
Protime: 31.2 Seconds — ABNORMAL HIGH (ref 10.6–13.4)

## 2014-09-11 LAB — POCT INR: INR: 2.6

## 2014-09-11 NOTE — Progress Notes (Signed)
INR remains at goal on current coumadin dose.  No changes in meds.  No bleeding or bruising.  Will continue coumadin 5mg  Tues/Sat and 7.5mg  other days.  Will check PT/INR in 1 month with next md appt.

## 2014-09-17 ENCOUNTER — Ambulatory Visit: Payer: BC Managed Care – PPO

## 2014-09-30 ENCOUNTER — Other Ambulatory Visit: Payer: Self-pay

## 2014-10-08 ENCOUNTER — Ambulatory Visit: Payer: BC Managed Care – PPO | Admitting: Hematology

## 2014-10-08 ENCOUNTER — Other Ambulatory Visit: Payer: BC Managed Care – PPO

## 2014-10-16 ENCOUNTER — Other Ambulatory Visit: Payer: BC Managed Care – PPO

## 2014-10-16 ENCOUNTER — Ambulatory Visit: Payer: BC Managed Care – PPO

## 2014-10-16 ENCOUNTER — Other Ambulatory Visit: Payer: Self-pay | Admitting: *Deleted

## 2014-10-16 ENCOUNTER — Telehealth: Payer: Self-pay | Admitting: Hematology

## 2014-10-16 ENCOUNTER — Encounter: Payer: BC Managed Care – PPO | Admitting: Hematology

## 2014-10-16 DIAGNOSIS — I82409 Acute embolism and thrombosis of unspecified deep veins of unspecified lower extremity: Secondary | ICD-10-CM

## 2014-10-16 NOTE — Progress Notes (Signed)
No show  This encounter was created in error - please disregard.

## 2014-10-16 NOTE — Telephone Encounter (Signed)
lvm for pt dtr per pof with new appt on 7.20

## 2014-10-23 ENCOUNTER — Other Ambulatory Visit (HOSPITAL_BASED_OUTPATIENT_CLINIC_OR_DEPARTMENT_OTHER): Payer: Medicare Other

## 2014-10-23 ENCOUNTER — Ambulatory Visit: Payer: Medicare Other | Admitting: Pharmacist

## 2014-10-23 ENCOUNTER — Ambulatory Visit (HOSPITAL_BASED_OUTPATIENT_CLINIC_OR_DEPARTMENT_OTHER): Payer: Medicare Other | Admitting: Hematology

## 2014-10-23 ENCOUNTER — Encounter: Payer: Self-pay | Admitting: Hematology

## 2014-10-23 VITALS — BP 143/72 | HR 62 | Temp 98.4°F | Resp 19 | Ht 63.0 in | Wt 261.9 lb

## 2014-10-23 DIAGNOSIS — I2510 Atherosclerotic heart disease of native coronary artery without angina pectoris: Secondary | ICD-10-CM

## 2014-10-23 DIAGNOSIS — I82409 Acute embolism and thrombosis of unspecified deep veins of unspecified lower extremity: Secondary | ICD-10-CM

## 2014-10-23 DIAGNOSIS — Z8673 Personal history of transient ischemic attack (TIA), and cerebral infarction without residual deficits: Secondary | ICD-10-CM

## 2014-10-23 DIAGNOSIS — I10 Essential (primary) hypertension: Secondary | ICD-10-CM

## 2014-10-23 LAB — PROTIME-INR
INR: 3 (ref 2.00–3.50)
Protime: 36 Seconds — ABNORMAL HIGH (ref 10.6–13.4)

## 2014-10-23 LAB — CBC WITH DIFFERENTIAL/PLATELET
BASO%: 1.2 % (ref 0.0–2.0)
Basophils Absolute: 0.2 10*3/uL — ABNORMAL HIGH (ref 0.0–0.1)
EOS ABS: 0.3 10*3/uL (ref 0.0–0.5)
EOS%: 2.3 % (ref 0.0–7.0)
HCT: 40.9 % (ref 34.8–46.6)
HGB: 13.3 g/dL (ref 11.6–15.9)
LYMPH#: 7.4 10*3/uL — AB (ref 0.9–3.3)
LYMPH%: 54.1 % — ABNORMAL HIGH (ref 14.0–49.7)
MCH: 27.5 pg (ref 25.1–34.0)
MCHC: 32.5 g/dL (ref 31.5–36.0)
MCV: 84.7 fL (ref 79.5–101.0)
MONO#: 0.8 10*3/uL (ref 0.1–0.9)
MONO%: 6.1 % (ref 0.0–14.0)
NEUT#: 5 10*3/uL (ref 1.5–6.5)
NEUT%: 36.3 % — AB (ref 38.4–76.8)
PLATELETS: 226 10*3/uL (ref 145–400)
RBC: 4.83 10*6/uL (ref 3.70–5.45)
RDW: 15.4 % — ABNORMAL HIGH (ref 11.2–14.5)
WBC: 13.7 10*3/uL — ABNORMAL HIGH (ref 3.9–10.3)

## 2014-10-23 LAB — COMPREHENSIVE METABOLIC PANEL (CC13)
ALBUMIN: 3.1 g/dL — AB (ref 3.5–5.0)
ALK PHOS: 131 U/L (ref 40–150)
ALT: 22 U/L (ref 0–55)
AST: 19 U/L (ref 5–34)
Anion Gap: 6 mEq/L (ref 3–11)
BILIRUBIN TOTAL: 0.51 mg/dL (ref 0.20–1.20)
BUN: 11.7 mg/dL (ref 7.0–26.0)
CHLORIDE: 105 meq/L (ref 98–109)
CO2: 26 mEq/L (ref 22–29)
Calcium: 9.1 mg/dL (ref 8.4–10.4)
Creatinine: 0.7 mg/dL (ref 0.6–1.1)
EGFR: 86 mL/min/{1.73_m2} — ABNORMAL LOW (ref >90)
GLUCOSE: 92 mg/dL (ref 70–140)
Potassium: 4 mEq/L (ref 3.5–5.1)
Sodium: 137 mEq/L (ref 136–145)
TOTAL PROTEIN: 6.7 g/dL (ref 6.4–8.3)

## 2014-10-23 LAB — POCT INR: INR: 3

## 2014-10-23 MED ORDER — WARFARIN SODIUM 5 MG PO TABS: ORAL_TABLET | ORAL | Status: DC

## 2014-10-23 NOTE — Progress Notes (Signed)
INR at goal today at 3 (goal 2-3) Pt seen by Dr. Burr Medico today Pt is doing well with no complaints Pt states she has been taking a different dose then what has been prescribed Ms. Frederico Hamman states she has been taking 7.5 mg only on Tuesday and Fridays and 5 mg all other days (she was instructed to do the opposite with 7.5 mg daily with 5 mg on Tuesdays/Fridays) She could have been confused but repeated this several times and was quite adamant about the dose she was taking No unusual bleeding or bruising No diet or medication changes Plan: No changes Continue Coumadin 7.5mg  on Tue and Fri and continue 5mg  the other days.  Return 11/27/14 at [place] for lab and 9:15am for coumadin clinic

## 2014-10-23 NOTE — Patient Instructions (Signed)
INR at goal No changes Continue Coumadin 7.5mg  on Tue and Fri and continue 5mg  the other days.  Return 11/27/14 at [place] for lab and 9:15am for coumadin clinic

## 2014-10-23 NOTE — Progress Notes (Signed)
[facility] NOTE 10/23/2014   Patient Care Team: Merrilee Seashore, MD as PCP - General (Internal Medicine)  REASON FOR OFFICE VISIT:   Recurrent DVT on Xarelto. Now on Coumadin. Follow up.    HISTORY OF PRESENTING ILLNESS:  1. Left LE DVT after surgery in 2012, treated with short course of coumadin  2. In June of 2014 she complained of pain in her right leg. There had been no specific provoking event leading to this symptom. Evaluation showed a DVT again in the left leg. She was started on rivaroxaban.  3. She presents to the ED ON 12/18/2013 after an episode of near syncope at home. Collapsed to ground but no LOC. No injury with this. Had some upper left-sided headache but this is like her usual headaches. Has h/o DVT x2 in the past, on chronic xarelto, legs were swollen. Bilateral lower extremity venous duplex completed. Bilateral lower extremities are positive for deep vein thrombosis involving bilateral posterior tibial and left peroneal veins. There is no evidence of Baker's cyst bilaterally. CTA without pulmonary embolism. Placed on IV heparin 12.5 ml/hr and Warfarin 7.5 mg/daily (dosing per pharmacy), Xarelto discontinued.   CURRENT THERAPY coumadin.   INTERIM HISTORY She returns for follow up with her husband. daughter. Her INR was 3 this morning. She is on 7.5mg  coumadin daily except Monday and Friday she takes 5mg . she is doing well overall, her INR has been therapeutic, and she has been remaining the same Coumadin dose in the past 6 months. No episodes of bleeding. Denies pain or other symptoms.  MEDICAL HISTORY:  Past Medical History  Diagnosis Date  . Hypertension   . Hemorrhoid   . GERD (gastroesophageal reflux disease)   . Adenomatous polyps   . DVT (deep venous thrombosis) 2003    lower extremity DVT with questionable recurrence in 2005. Treated with 1.5 year course of coumadin.  [name] History of TIAs     2004 - no documentation  available. 09/2010 -  was not on aspirin or statin therapy prior to event.  . Endometrial thickening on ultra sound 07/2009    path showing Fragments of benign endocervical and squamous mucosa. No dysplasia or malignancy // Followed by Dr. [name]  . Coronary artery disease, non-occlusive 11/2003; 01/2014    LHC (11/2003) - LAD diffuse 30% stenosis of mid-vessel. RCA with 30% stenosis of proximal vessel - by Dr. Dannielle Burn. // 2D Echo (04/2006) -  LV EF 65%.  Mild LVH. No  wall motion abnormalities.  Mild diastolic dysfunction. 2015 with STEMI- stent palced  . Prediabetes DX: 09/2010    HgA1c 6.1  . Stroke   . Syncope 12/18/2013  . Hx of myocardial infarction less than 8 weeks 01/24/14  . Hyperlipidemia LDL goal <70   Sleep apnea SURGICAL HISTORY: Past Surgical History  Procedure Laterality Date  . Tubal ligation    . Knee surgery  1996    Left knee arthroscopy.  . Colonoscopy w/ polypectomy  11/01/2007    8 mm rectal adenoma, hemorrhoids  . Rotator cuff repair      right.  . Coronary angioplasty with stent placement  01/24/14    Inf. STEMI, BMS to RCA  . Left heart catheterization with coronary angiogram N/A 01/24/2014    Procedure: LEFT HEART CATHETERIZATION WITH CORONARY ANGIOGRAM;  Surgeon: Burnell Blanks, MD;  Location: [facility] CATH LAB;  Service: Cardiovascular;  Laterality: N/A;    SOCIAL HISTORY: History   Social History  .  Marital Status: Married    Spouse Name: N/A  . Number of Children: 6  . Years of Education: College   Occupational History  . Control and instrumentation engineer   . [employer]   Social History Main Topics  . Smoking status: Never Smoker   . Smokeless tobacco: Never Used  . Alcohol Use: No  . Drug Use: No  . Sexual Activity: Not on file   Other Topics Concern  . Not on file   Social History Narrative   Full Code status.   Insurance: BCBS    FAMILY HISTORY: Family History  Problem Relation Age of Onset  .  Thyroid cancer Sister   . Liver cancer Sister   . Diabetes Mother   . Hyperlipidemia Mother   . Heart disease Mother   . Dementia Mother   . Angina Father   . Heart disease Brother     x 2 in 63s yo  . Alzheimer's disease Maternal Grandmother   . Heart attack Father   . Stroke Neg Hx     ALLERGIES:  has No Known Allergies.  MEDICATIONS:  Current Outpatient Prescriptions  Medication Sig Dispense Refill  . acetaminophen (TYLENOL) 500 MG tablet Take 500 mg by mouth every 6 (six) hours as needed for mild pain.     [name] atorvastatin (LIPITOR) 80 MG tablet Take 1 tablet (80 mg total) by mouth daily at 6 PM. 30 tablet 5  . clopidogrel (PLAVIX) 75 MG tablet Take 1 tablet (75 mg total) by mouth daily with breakfast. 30 tablet 11  . lisinopril-hydrochlorothiazide (PRINZIDE,ZESTORETIC) 10-12.5 MG per tablet Take 1 tablet by mouth daily. 30 tablet 11  . metoprolol succinate (TOPROL-XL) 25 MG 24 hr tablet TAKE 1 TABLET (25 MG TOTAL) BY MOUTH DAILY. TAKE WITH OR IMMEDIATELY FOLLOWING A MEAL. 30 tablet 6  . Multiple Vitamin (MULTIVITAMIN WITH MINERALS) TABS Take 1 tablet by mouth every morning.    . nitroGLYCERIN (NITROSTAT) 0.4 MG SL tablet Place 1 tablet (0.4 mg total) under the tongue every 5 (five) minutes x 3 doses as needed for chest pain. 25 tablet 12  . traMADol (ULTRAM) 50 MG tablet Take 1 tablet (50 mg total) by mouth every 6 (six) hours as needed. 15 tablet 0  . warfarin (COUMADIN) 10 MG tablet Take 10-15 mg by mouth See admin instructions. Take 1 tablet on Tuesday and Friday. Take 1.5 tablets all other days.    [name] warfarin (COUMADIN) 5 MG tablet TAKE 7.5 MG (1.5 TABLET) BY MOUTH DAILY OR AS INSTRUCTED BY THE COUMADIN CLINIC 45 tablet 3   No current facility-administered medications for this visit.    REVIEW OF SYSTEMS:   Constitutional: Denies fevers, chills or abnormal night sweats Eyes: Denies blurriness of vision, double vision or watery eyes Ears, nose, mouth, throat, and face:  Denies mucositis or sore throat Respiratory: Denies cough, dyspnea or wheezes Cardiovascular: Denies palpitation, chest discomfort or lower extremity swelling Gastrointestinal:  Denies nausea, heartburn or change in bowel habits Skin: Denies abnormal skin rashes Lymphatics: Denies new lymphadenopathy or easy bruising Neurological:Denies numbness, tingling or new weaknesses Behavioral/Psych: Mood is stable, no new changes  All other systems were reviewed with the patient and are negative.  PHYSICAL EXAMINATION: ECOG PERFORMANCE STATUS: 1  Filed Vitals:   10/23/14 1101  BP: 143/72  Pulse: 62  Temp: 98.4 F (36.9 C)  Resp: 19   Filed Weights   10/23/14 1101  Weight: 261 lb 14.4 oz (118.797 kg)  GENERAL:alert, no distress and comfortable, obese+ SKIN: skin color, texture, turgor are normal, no rashes or significant lesions EYES: normal, conjunctiva are pink and non-injected, sclera clear OROPHARYNX:no exudate, no erythema and lips, buccal mucosa, and tongue normal  NECK: supple, thyroid normal size, non-tender, without nodularity LYMPH:  no palpable lymphadenopathy in the cervical, axillary or inguinal LUNGS: clear to auscultation and percussion with normal breathing effort HEART: regular rate & rhythm and no murmurs and no lower extremity edema ABDOMEN:abdomen soft, non-tender and normal bowel sounds Musculoskeletal:no cyanosis of digits and no clubbing  PSYCH: alert & oriented x 3 with fluent speech NEURO: no focal motor/sensory deficits  LABORATORY DATA:  I have reviewed the data as listed CBC Latest Ref Rng 10/23/2014 08/11/2014 08/11/2014  WBC 3.9 - 10.3 10e3/uL 13.7(H) - 13.2(H)  Hemoglobin 11.6 - 15.9 g/dL 13.3 13.3 12.7  Hematocrit 34.8 - 46.6 % 40.9 39.0 37.6  Platelets 145 - 400 10e3/uL 226 - 265     Recent Labs  03/19/14 0745 07/02/14 2333 07/03/14 0536 08/11/14 0110 08/11/14 0114 10/23/14 1025  NA  --  134* 138 133* 137 137  K  --  3.7 3.7 3.6 3.7 4.0   CL  --  98 105 99* 99*  --   CO2  --  23 26 26   --  26  GLUCOSE  --  106* 104* 102* 102* 92  BUN  --  11 9 14 14  11.7  CREATININE  --  0.71 0.63 0.72 0.80 0.7  CALCIUM  --  8.6 8.6 8.5*  --  9.1  GFRNONAA  --  83* 86* >60  --   --   GFRAA  --  >90 >90 >60  --   --   PROT 6.4  --  5.6* 6.6  --  6.7  ALBUMIN 3.1*  --  2.7* 3.1*  --  3.1*  AST 19  --  20 21  --  19  ALT 22  --  21 23  --  22  ALKPHOS 127*  --  110 120  --  131  BILITOT 0.5  --  0.8 0.6  --  0.51  BILIDIR 0.0  --   --   --   --   --       RADIOGRAPHIC STUDIES: I have personally reviewed the radiological images as listed and agreed with the findings in the report.  Low extremity Duplex 04/02/2014 - Mild technical difficult due to body habitus. - No evidence of deep vein or superficial thrombosis involving the right lower extremity and left lower extremity. - Previous DVT in the right posterior tibial, left posterior tibial, and left peroneal veins appear to have resolved. - No evidence of Baker&'s cyst on the right or left.  ASSESSMENT/PLAN:  74 year old female   1.Recurrent LE DVT. -although her hemophilia work up was negative, she has multiple risk factors for DVT (multiple history of DVT, obesity, sedentary life style), so we recommend life long anticoagulation. The goal of INR is 2.5-3.0. -She has been compliant with Coumadin, and her INR has been therapeutic and stable -She will continue follow-up with our Coumadin clinic for the next 2 months. Unfortunately, our Coumadin clinic will be closed in September 2016. I encouraged her to contact her primary care physician to transfer her Coumadin monitoring and care back to her primary care physician.  2. HTN CAD, history of recent MI and stroke  -She will continue follow-up with her primary care physician and her  cardiologist. -Continue medications.  Follow-up: She will transition her Coumadin monitoring and DVT management to her primary care physician.  I'll only see her as needed in the future.   All questions were answered. The patient knows to call the clinic with any problems, questions or concerns. I spent 15 minutes counseling the patient face to face. The total time spent in the appointment was 20 minutes.  Truitt Merle  10/23/2014

## 2014-10-25 ENCOUNTER — Telehealth: Payer: Self-pay | Admitting: Hematology

## 2014-10-25 ENCOUNTER — Other Ambulatory Visit: Payer: Self-pay | Admitting: Hematology

## 2014-10-25 NOTE — Telephone Encounter (Signed)
per pof to sch pt CC-pt aware

## 2014-10-28 ENCOUNTER — Ambulatory Visit: Payer: Medicare Other

## 2014-10-28 ENCOUNTER — Other Ambulatory Visit: Payer: Medicare Other

## 2014-11-27 ENCOUNTER — Ambulatory Visit: Payer: Medicare Other

## 2014-11-27 ENCOUNTER — Other Ambulatory Visit: Payer: Medicare Other

## 2014-11-28 ENCOUNTER — Other Ambulatory Visit (HOSPITAL_BASED_OUTPATIENT_CLINIC_OR_DEPARTMENT_OTHER): Payer: Medicare Other

## 2014-11-28 ENCOUNTER — Ambulatory Visit (HOSPITAL_BASED_OUTPATIENT_CLINIC_OR_DEPARTMENT_OTHER): Payer: Medicare Other | Admitting: Pharmacist

## 2014-11-28 DIAGNOSIS — I82723 Chronic embolism and thrombosis of deep veins of upper extremity, bilateral: Secondary | ICD-10-CM | POA: Diagnosis present

## 2014-11-28 DIAGNOSIS — I82409 Acute embolism and thrombosis of unspecified deep veins of unspecified lower extremity: Secondary | ICD-10-CM

## 2014-11-28 LAB — PROTIME-INR
INR: 3.7 — AB (ref 2.00–3.50)
PROTIME: 44.4 s — AB (ref 10.6–13.4)

## 2014-11-28 LAB — POCT INR: INR: 3.7

## 2014-11-28 MED ORDER — WARFARIN SODIUM 5 MG PO TABS: ORAL_TABLET | ORAL | Status: DC

## 2014-11-28 NOTE — Progress Notes (Signed)
INR slightly above goal today. No problems or concerns regarding anticoagulation. No missed or extra coumadin doses. No changes in diet or medications. No unusual bruising. No bleeding noted. No s/s of clotting noted. Pt stated that she does have some pain in her back.  Her cardiologist explained this is due to previous cardiac arrest/trauma. She will call if pain becomes more consistent or severe. She feels it may be positional. Pt has been fairly stable on 7.5mg  daily except 5mg  on Tu&Fri. Hold coumadin today.  Continue Coumadin 7.5mg  daily except 5mg  on Tue and Fri.   Return on 12/13/14 at 9:30am for lab and 9:45am for coumadin clinic. Will evaluate INR on 12/13/14 and adjust dose as needed. Prescription refill called to [facility] on [address].

## 2014-11-28 NOTE — Patient Instructions (Signed)
Hold coumadin today.  Continue Coumadin 7.5mg  daily except 5mg  on Tue and Fri.   Return on 12/13/14 at 9:30am for lab and 9:45am for coumadin clinic.

## 2014-11-29 ENCOUNTER — Telehealth: Payer: Self-pay | Admitting: Hematology

## 2014-11-29 NOTE — Telephone Encounter (Signed)
per Theadora Rama in CC to sch pt CC-pt aware

## 2014-12-13 ENCOUNTER — Encounter: Payer: Self-pay | Admitting: Pharmacist

## 2014-12-13 ENCOUNTER — Telehealth: Payer: Self-pay | Admitting: Pharmacist

## 2014-12-13 ENCOUNTER — Ambulatory Visit (HOSPITAL_BASED_OUTPATIENT_CLINIC_OR_DEPARTMENT_OTHER): Payer: Medicare Other | Admitting: Pharmacist

## 2014-12-13 ENCOUNTER — Other Ambulatory Visit (HOSPITAL_BASED_OUTPATIENT_CLINIC_OR_DEPARTMENT_OTHER): Payer: Medicare Other

## 2014-12-13 DIAGNOSIS — I82409 Acute embolism and thrombosis of unspecified deep veins of unspecified lower extremity: Secondary | ICD-10-CM | POA: Diagnosis present

## 2014-12-13 LAB — CBC WITH DIFFERENTIAL/PLATELET
BASO%: 1 % (ref 0.0–2.0)
Basophils Absolute: 0.1 10*3/uL (ref 0.0–0.1)
EOS%: 3.1 % (ref 0.0–7.0)
Eosinophils Absolute: 0.4 10*3/uL (ref 0.0–0.5)
HCT: 37.4 % (ref 34.8–46.6)
HGB: 12.4 g/dL (ref 11.6–15.9)
LYMPH#: 6.3 10*3/uL — AB (ref 0.9–3.3)
LYMPH%: 50.4 % — ABNORMAL HIGH (ref 14.0–49.7)
MCH: 27.9 pg (ref 25.1–34.0)
MCHC: 33.2 g/dL (ref 31.5–36.0)
MCV: 84.2 fL (ref 79.5–101.0)
MONO#: 0.7 10*3/uL (ref 0.1–0.9)
MONO%: 5.7 % (ref 0.0–14.0)
NEUT#: 4.9 10*3/uL (ref 1.5–6.5)
NEUT%: 39.8 % (ref 38.4–76.8)
Platelets: 201 10*3/uL (ref 145–400)
RBC: 4.44 10*6/uL (ref 3.70–5.45)
RDW: 14.7 % — AB (ref 11.2–14.5)
WBC: 12.4 10*3/uL — ABNORMAL HIGH (ref 3.9–10.3)

## 2014-12-13 LAB — PROTIME-INR
INR: 4.1 — ABNORMAL HIGH (ref 2.00–3.50)
Protime: 49.2 Seconds — ABNORMAL HIGH (ref 10.6–13.4)

## 2014-12-13 LAB — COMPREHENSIVE METABOLIC PANEL (CC13)
ALK PHOS: 131 U/L (ref 40–150)
ALT: 20 U/L (ref 0–55)
AST: 16 U/L (ref 5–34)
Albumin: 2.8 g/dL — ABNORMAL LOW (ref 3.5–5.0)
Anion Gap: 5 mEq/L (ref 3–11)
BUN: 10.6 mg/dL (ref 7.0–26.0)
CALCIUM: 8.8 mg/dL (ref 8.4–10.4)
CO2: 28 mEq/L (ref 22–29)
Chloride: 104 mEq/L (ref 98–109)
Creatinine: 0.7 mg/dL (ref 0.6–1.1)
EGFR: 86 mL/min/{1.73_m2} — ABNORMAL LOW (ref >90)
Glucose: 98 mg/dl (ref 70–140)
POTASSIUM: 3.8 meq/L (ref 3.5–5.1)
Sodium: 137 mEq/L (ref 136–145)
Total Bilirubin: 0.51 mg/dL (ref 0.20–1.20)
Total Protein: 5.9 g/dL — ABNORMAL LOW (ref 6.4–8.3)

## 2014-12-13 LAB — POCT INR: INR: 4.1

## 2014-12-13 NOTE — Telephone Encounter (Signed)
Received a call from [facility] at cancer center who currently manages patient's Coumadin. Since cancer center Coumadin clinic is closing, pt would like to transfer care to [facility] for anticoag monitoring. She is already a patient of Dr. [name]. Pt has 1 more visit at cancer center for Coumadin, informed Lattie Haw that it was okay for her to schedule patient in our clinic following that visit.

## 2014-12-13 NOTE — Telephone Encounter (Signed)
This encounter was created in error - please disregard.

## 2014-12-13 NOTE — Progress Notes (Signed)
INR = 4.1   Goal 2.5-3 H/H = 12.4/37.4     Pltc = 201 INR above goal range today. No medication changes. Patient states that she has been eating less vegetables than usual, no other dietary changes. Patient reports nose bleeds last 3 days and "bad" headache lasting all 3 days. Nose bled all day x 2 days, then resolved sometime yesterday morning. She did not call to report this or seek medical attention. She states the headache was worrisome due to her history of CVA. She also states she had a friend who died from nosebleeds while on Coumadin. Nosebleed and headache resolved yesterday; she states she feels fine today. I told her that if she experienced nosebleeds that did not resolve quickly or headaches that were severe she should either go to the ED or call the Cancer center and notify the physician on call for instructions. She states she will do this. (husband is with her and nodded in agreement) She is aware that the Coumadin clinic is closing. Given her multiple issues we will see her again next week. I spoke with Dr. Burr Medico and we will transition this patient to [place] [facility] on [facility]. as she is a patient of Dr. Stanford Breed. I spoke with Fuller Canada at that clinic [phone]) and we will make her an appt with them when we see her next week. We will hold Coumadin today and tomorrow, then decrease Coumadin to 5 mg daily. She will return 12/18/14 at 9:30am for lab and 9:45am for Coumadin clinic.  [employer], Pharm D

## 2014-12-17 ENCOUNTER — Telehealth: Payer: Self-pay | Admitting: Hematology

## 2014-12-17 ENCOUNTER — Other Ambulatory Visit: Payer: Medicare Other

## 2014-12-17 NOTE — Telephone Encounter (Signed)
pt cld to r/s later appt-r/s to later time per pt req

## 2014-12-18 ENCOUNTER — Ambulatory Visit (HOSPITAL_BASED_OUTPATIENT_CLINIC_OR_DEPARTMENT_OTHER): Payer: Medicare Other | Admitting: Pharmacist

## 2014-12-18 ENCOUNTER — Other Ambulatory Visit (HOSPITAL_BASED_OUTPATIENT_CLINIC_OR_DEPARTMENT_OTHER): Payer: Medicare Other

## 2014-12-18 ENCOUNTER — Other Ambulatory Visit: Payer: Medicare Other

## 2014-12-18 ENCOUNTER — Ambulatory Visit: Payer: Medicare Other

## 2014-12-18 DIAGNOSIS — I82409 Acute embolism and thrombosis of unspecified deep veins of unspecified lower extremity: Secondary | ICD-10-CM

## 2014-12-18 LAB — PROTIME-INR
INR: 1.5 — AB (ref 2.00–3.50)
Protime: 18 Seconds — ABNORMAL HIGH (ref 10.6–13.4)

## 2014-12-18 NOTE — Progress Notes (Addendum)
LAST COUMADIN CLINIC VISIT  INR = 1.5   Goal 2.5-3  INR is below goal range. No swelling or redness noted. No new medications or dietary changes. Coumadin dosing held x 2 doses, then decreased last week following supratherapeutic INR with 3 days of nose bleeds and headache. Mrs. Hurless states there has been no further bleeding or headache. Her only complaint today is right flank pain. She has treated this with a few doses of Tylenol but continues to have sharp pain at times. I have asked her to call her PCP for this, she states she will do so. She was instructed last September when she had a DVT to not travel. She has remained near home since that time and would like to travel in the future. I told her that if she travels she will need to stop the car and walk every 1 to 2 hours. If she is on a flight, she will need to move around in her seat as much as possible as well as walking in the aisles when allowed and walking around in airports. She will be transitioning her anticoagulation management to [facility] on [facility]. She is a patient of Dr. [name]. I have spoken with Porfirio Oar and made an appt for her there next week. She will take Coumadin 10mg  today, then take Coumadin 7.5 mg daily except 5 mg on Mon, Wed, and Fri. She will be seen at [facility] on [address]. on 12/25/14 at 2:30pm.  Theone Murdoch, PharmD

## 2014-12-25 ENCOUNTER — Ambulatory Visit (INDEPENDENT_AMBULATORY_CARE_PROVIDER_SITE_OTHER): Payer: Medicare Other | Admitting: Pharmacist

## 2014-12-25 DIAGNOSIS — I82409 Acute embolism and thrombosis of unspecified deep veins of unspecified lower extremity: Secondary | ICD-10-CM

## 2014-12-25 LAB — POCT INR: INR: 2.9

## 2014-12-26 DIAGNOSIS — S239XXA Sprain of unspecified parts of thorax, initial encounter: Secondary | ICD-10-CM | POA: Diagnosis not present

## 2014-12-26 DIAGNOSIS — Z23 Encounter for immunization: Secondary | ICD-10-CM | POA: Diagnosis not present

## 2015-01-01 ENCOUNTER — Other Ambulatory Visit: Payer: Self-pay | Admitting: Cardiovascular Disease

## 2015-01-05 ENCOUNTER — Emergency Department (HOSPITAL_COMMUNITY): Payer: Medicare Other

## 2015-01-05 ENCOUNTER — Encounter (HOSPITAL_COMMUNITY): Payer: Self-pay

## 2015-01-05 ENCOUNTER — Emergency Department (HOSPITAL_COMMUNITY)
Admission: EM | Admit: 2015-01-05 | Discharge: 2015-01-05 | Disposition: A | Discharge status: Home / Self Care | Payer: Medicare Other | Attending: Emergency Medicine | Admitting: Emergency Medicine

## 2015-01-05 DIAGNOSIS — Z9861 Coronary angioplasty status: Secondary | ICD-10-CM | POA: Diagnosis not present

## 2015-01-05 DIAGNOSIS — Z7902 Long term (current) use of antithrombotics/antiplatelets: Secondary | ICD-10-CM | POA: Diagnosis not present

## 2015-01-05 DIAGNOSIS — M25512 Pain in left shoulder: Secondary | ICD-10-CM | POA: Insufficient documentation

## 2015-01-05 DIAGNOSIS — Z79899 Other long term (current) drug therapy: Secondary | ICD-10-CM | POA: Insufficient documentation

## 2015-01-05 DIAGNOSIS — I252 Old myocardial infarction: Secondary | ICD-10-CM | POA: Diagnosis not present

## 2015-01-05 DIAGNOSIS — E785 Hyperlipidemia, unspecified: Secondary | ICD-10-CM | POA: Diagnosis not present

## 2015-01-05 DIAGNOSIS — I10 Essential (primary) hypertension: Secondary | ICD-10-CM | POA: Insufficient documentation

## 2015-01-05 DIAGNOSIS — R208 Other disturbances of skin sensation: Secondary | ICD-10-CM | POA: Diagnosis not present

## 2015-01-05 DIAGNOSIS — Z8719 Personal history of other diseases of the digestive system: Secondary | ICD-10-CM | POA: Insufficient documentation

## 2015-01-05 DIAGNOSIS — Z86718 Personal history of other venous thrombosis and embolism: Secondary | ICD-10-CM | POA: Insufficient documentation

## 2015-01-05 DIAGNOSIS — Z7901 Long term (current) use of anticoagulants: Secondary | ICD-10-CM | POA: Insufficient documentation

## 2015-01-05 DIAGNOSIS — R200 Anesthesia of skin: Secondary | ICD-10-CM | POA: Diagnosis not present

## 2015-01-05 DIAGNOSIS — Z86018 Personal history of other benign neoplasm: Secondary | ICD-10-CM | POA: Insufficient documentation

## 2015-01-05 DIAGNOSIS — Z8673 Personal history of transient ischemic attack (TIA), and cerebral infarction without residual deficits: Secondary | ICD-10-CM | POA: Insufficient documentation

## 2015-01-05 LAB — COMPREHENSIVE METABOLIC PANEL
ALBUMIN: 3.1 g/dL — AB (ref 3.5–5.0)
ALK PHOS: 113 U/L (ref 38–126)
ALT: 24 U/L (ref 14–54)
AST: 49 U/L — AB (ref 15–41)
Anion gap: 10 (ref 5–15)
BILIRUBIN TOTAL: 1.6 mg/dL — AB (ref 0.3–1.2)
BUN: 12 mg/dL (ref 6–20)
CO2: 23 mmol/L (ref 22–32)
CREATININE: 0.74 mg/dL (ref 0.44–1.00)
Calcium: 8.3 mg/dL — ABNORMAL LOW (ref 8.9–10.3)
Chloride: 97 mmol/L — ABNORMAL LOW (ref 101–111)
GFR calc Af Amer: >60 mL/min (ref >60)
GLUCOSE: 101 mg/dL — AB (ref 65–99)
POTASSIUM: 5.1 mmol/L (ref 3.5–5.1)
Sodium: 130 mmol/L — ABNORMAL LOW (ref 135–145)
TOTAL PROTEIN: 6.1 g/dL — AB (ref 6.5–8.1)

## 2015-01-05 LAB — DIFFERENTIAL
BASOS PCT: 1 %
Basophils Absolute: 0.2 10*3/uL — ABNORMAL HIGH (ref 0.0–0.1)
EOS PCT: 3 %
Eosinophils Absolute: 0.5 10*3/uL (ref 0.0–0.7)
LYMPHS PCT: 52 %
Lymphs Abs: 8.2 10*3/uL — ABNORMAL HIGH (ref 0.7–4.0)
MONO ABS: 1 10*3/uL (ref 0.1–1.0)
MONOS PCT: 6 %
Neutro Abs: 6.1 10*3/uL (ref 1.7–7.7)
Neutrophils Relative %: 38 %

## 2015-01-05 LAB — I-STAT CHEM 8, ED
BUN: 18 mg/dL (ref 6–20)
CREATININE: 0.7 mg/dL (ref 0.44–1.00)
Calcium, Ion: 1.03 mmol/L — ABNORMAL LOW (ref 1.13–1.30)
Chloride: 99 mmol/L — ABNORMAL LOW (ref 101–111)
GLUCOSE: 100 mg/dL — AB (ref 65–99)
HEMATOCRIT: 42 % (ref 36.0–46.0)
Hemoglobin: 14.3 g/dL (ref 12.0–15.0)
POTASSIUM: 5.4 mmol/L — AB (ref 3.5–5.1)
Sodium: 133 mmol/L — ABNORMAL LOW (ref 135–145)
TCO2: 25 mmol/L (ref 0–100)

## 2015-01-05 LAB — I-STAT TROPONIN, ED: TROPONIN I, POC: 0 ng/mL (ref 0.00–0.08)

## 2015-01-05 LAB — CBC
HEMATOCRIT: 39.9 % (ref 36.0–46.0)
HEMOGLOBIN: 13.4 g/dL (ref 12.0–15.0)
MCH: 28 pg (ref 26.0–34.0)
MCHC: 33.6 g/dL (ref 30.0–36.0)
MCV: 83.5 fL (ref 78.0–100.0)
PLATELETS: 248 10*3/uL (ref 150–400)
RBC: 4.78 MIL/uL (ref 3.87–5.11)
RDW: 14.8 % (ref 11.5–15.5)
WBC: 16 10*3/uL — AB (ref 4.0–10.5)

## 2015-01-05 LAB — PROTIME-INR
INR: 3.63 — ABNORMAL HIGH (ref 0.00–1.49)
PROTHROMBIN TIME: 35.3 s — AB (ref 11.6–15.2)

## 2015-01-05 LAB — APTT: aPTT: 39 seconds — ABNORMAL HIGH (ref 24–37)

## 2015-01-05 MED ORDER — HYDROCODONE-ACETAMINOPHEN 5-325 MG PO TABS: 1.0000 | ORAL_TABLET | Freq: Four times a day (QID) | ORAL | Status: DC | PRN

## 2015-01-05 MED ORDER — HYDROCODONE-ACETAMINOPHEN 5-325 MG PO TABS
2.0000 | ORAL_TABLET | Freq: Once | ORAL | Status: AC
  Administered 2015-01-05: 2 via ORAL
  Filled 2015-01-05: qty 2

## 2015-01-05 NOTE — ED Notes (Signed)
Pt here with family for left shoulder pain that has been going on since yesterday and states that she started having left facial numbness "around" midnight. Unable to pinpoint exact time numbness started. Unable to lift left arm d/t pain not because of weakness. No speech deficits. Pt does have hx of stroke and blood clots and takes coumadin.

## 2015-01-05 NOTE — Discharge Instructions (Signed)
Wear arm sling as applied for the next several days for comfort.  Hydrocodone as prescribed as needed for pain.  Follow-up with your primary Dr. if not improving in the next week, and return to the ER symptoms significantly worsen or change.   Shoulder Pain The shoulder is the joint that connects your arms to your body. The bones that form the shoulder joint include the upper arm bone (humerus), the shoulder blade (scapula), and the collarbone (clavicle). The top of the humerus is shaped like a ball and fits into a rather flat socket on the scapula (glenoid cavity). A combination of muscles and strong, fibrous tissues that connect muscles to bones (tendons) support your shoulder joint and hold the ball in the socket. Small, fluid-filled sacs (bursae) are located in different areas of the joint. They act as cushions between the bones and the overlying soft tissues and help reduce friction between the gliding tendons and the bone as you move your arm. Your shoulder joint allows a wide range of motion in your arm. This range of motion allows you to do things like scratch your back or throw a ball. However, this range of motion also makes your shoulder more prone to pain from overuse and injury. Causes of shoulder pain can originate from both injury and overuse and usually can be grouped in the following four categories:  Redness, swelling, and pain (inflammation) of the tendon (tendinitis) or the bursae (bursitis).  Instability, such as a dislocation of the joint.  Inflammation of the joint (arthritis).  Broken bone (fracture). HOME CARE INSTRUCTIONS   Apply ice to the sore area.  Put ice in a plastic bag.  Place a towel between your skin and the bag.  Leave the ice on for 15-20 minutes, 3-4 times per day for the first 2 days, or as directed by your health care provider.  Stop using cold packs if they do not help with the pain.  If you have a shoulder sling or immobilizer, wear it as long  as your caregiver instructs. Only remove it to shower or bathe. Move your arm as little as possible, but keep your hand moving to prevent swelling.  Squeeze a soft ball or foam pad as much as possible to help prevent swelling.  Only take over-the-counter or prescription medicines for pain, discomfort, or fever as directed by your caregiver. SEEK MEDICAL CARE IF:   Your shoulder pain increases, or new pain develops in your arm, hand, or fingers.  Your hand or fingers become cold and numb.  Your pain is not relieved with medicines. SEEK IMMEDIATE MEDICAL CARE IF:   Your arm, hand, or fingers are numb or tingling.  Your arm, hand, or fingers are significantly swollen or turn white or blue. MAKE SURE YOU:   Understand these instructions.  Will watch your condition.  Will get help right away if you are not doing well or get worse. Document Released: 12/30/2004 Document Revised: 08/06/2013 Document Reviewed: 03/06/2011 [facility] Patient Information 2015 [place], [place]. This information is not intended to replace advice given to you by your health care provider. Make sure you discuss any questions you have with your health care provider.  Paresthesia Paresthesia is an abnormal burning or prickling sensation. This sensation is generally felt in the hands, arms, legs, or feet. However, it may occur in any part of the body. It is usually not painful. The feeling may be described as:  Tingling or numbness.  "Pins and needles."  Skin crawling.  Buzzing.  Limbs "falling asleep."  Itching. Most people experience temporary (transient) paresthesia at some time in their lives. CAUSES  Paresthesia may occur when you breathe too quickly (hyperventilation). It can also occur without any apparent cause. Commonly, paresthesia occurs when pressure is placed on a nerve. The feeling quickly goes away once the pressure is removed. For some people, however, paresthesia is a long-lasting (chronic)  condition caused by an underlying disorder. The underlying disorder may be:  A traumatic, direct injury to nerves. Examples include a:  Broken (fractured) neck.  Fractured skull.  A disorder affecting the brain and spinal cord (central nervous system). Examples include:  Transverse myelitis.  Encephalitis.  Transient ischemic attack.  Multiple sclerosis.  Stroke.  Tumor or blood vessel problems, such as an arteriovenous malformation pressing against the brain or spinal cord.  A condition that damages the peripheral nerves (peripheral neuropathy). Peripheral nerves are not part of the brain and spinal cord. These conditions include:  Diabetes.  Peripheral vascular disease.  Nerve entrapment syndromes, such as carpal tunnel syndrome.  Shingles.  Hypothyroidism.  Vitamin B12 deficiencies.  Alcoholism.  Heavy metal poisoning (lead, arsenic).  Rheumatoid arthritis.  Systemic lupus erythematosus. DIAGNOSIS  Your caregiver will attempt to find the underlying cause of your paresthesia. Your caregiver may:  Take your medical history.  Perform a physical exam.  Order various lab tests.  Order imaging tests. TREATMENT  Treatment for paresthesia depends on the underlying cause. HOME CARE INSTRUCTIONS  Avoid drinking alcohol.  You may consider massage or acupuncture to help relieve your symptoms.  Keep all follow-up appointments as directed by your caregiver. SEEK IMMEDIATE MEDICAL CARE IF:   You feel weak.  You have trouble walking or moving.  You have problems with speech or vision.  You feel confused.  You cannot control your bladder or bowel movements.  You feel numbness after an injury.  You faint.  Your burning or prickling feeling gets worse when walking.  You have pain, cramps, or dizziness.  You develop a rash. MAKE SURE YOU:  Understand these instructions.  Will watch your condition.  Will get help right away if you are not doing  well or get worse. Document Released: 03/12/2002 Document Revised: 06/14/2011 Document Reviewed: 12/11/2010 [facility] Patient Information 2015 [place], [place]. This information is not intended to replace advice given to you by your health care provider. Make sure you discuss any questions you have with your health care provider.

## 2015-01-05 NOTE — ED Provider Notes (Signed)
CSN: [id number]     Arrival date & time 01/05/15  0150 History  By signing my name below, I, Terrance Branch, attest that this documentation has been prepared under the direction and in the presence of Veryl Speak, MD. Electronically Signed: Randa Evens, ED Scribe. 01/05/2015. 2:14 AM.      Chief Complaint  Patient presents with  . Numbness    facial  . Shoulder Pain   Patient is a 74 y.o. female presenting with shoulder pain. The history is provided by the patient. No language interpreter was used.  Shoulder Pain Location:  Shoulder Injury: no   Shoulder location:  L shoulder Pain details:    Timing:  Constant Chronicity:  New Worsened by:  Movement Ineffective treatments:  Acetaminophen  HPI Comments: BRIDGID PRINTZ is a 74 y.o. female who presents to the Emergency Department complaining of left shoulder pain onset yesterday evening. She states that the pain is radiating to her left shoulder. She is also complaining of left sided facial numbness. She states that she has tried ointment with no relief. She states that the pain is worse with movement. Pt states she has tried tylenol with no relief. Pt denies fall or injury to the shoulder. Pt denies weakness.   Past Medical History  Diagnosis Date  . Hypertension   . Hemorrhoid   . GERD (gastroesophageal reflux disease)   . Adenomatous polyps   . DVT (deep venous thrombosis) ([place]) 2003    lower extremity DVT with questionable recurrence in 2005. Treated with 1.5 year course of coumadin.  [name] History of TIAs     2004 - no documentation available. 09/2010 -  was not on aspirin or statin therapy prior to event.  . Endometrial thickening on ultra sound 07/2009    path showing Fragments of benign endocervical and squamous mucosa. No dysplasia or malignancy // Followed by Dr. [name]  . Coronary artery disease, non-occlusive 11/2003; 01/2014    LHC (11/2003) - LAD diffuse 30% stenosis of mid-vessel. RCA with 30% stenosis of  proximal vessel - by Dr. Dannielle Burn. // 2D Echo (04/2006) -  LV EF 65%.  Mild LVH. No  wall motion abnormalities.  Mild diastolic dysfunction. 2015 with STEMI- stent palced  . Prediabetes DX: 09/2010    HgA1c 6.1  . Stroke ([place])   . Syncope 12/18/2013  . Hx of myocardial infarction less than 8 weeks 01/24/14  . Hyperlipidemia LDL goal <70    Past Surgical History  Procedure Laterality Date  . Tubal ligation    . Knee surgery  1996    Left knee arthroscopy.  . Colonoscopy w/ polypectomy  11/01/2007    8 mm rectal adenoma, hemorrhoids  . Rotator cuff repair      right.  . Coronary angioplasty with stent placement  01/24/14    Inf. STEMI, BMS to RCA  . Left heart catheterization with coronary angiogram N/A 01/24/2014    Procedure: LEFT HEART CATHETERIZATION WITH CORONARY ANGIOGRAM;  Surgeon: Burnell Blanks, MD;  Location: [facility] CATH LAB;  Service: Cardiovascular;  Laterality: N/A;   Family History  Problem Relation Age of Onset  . Thyroid cancer Sister   . Liver cancer Sister   . Diabetes Mother   . Hyperlipidemia Mother   . Heart disease Mother   . Dementia Mother   . Angina Father   . Heart disease Brother     x 2 in 23s yo  . Alzheimer's disease Maternal Grandmother   . Heart attack  Father   . Stroke Neg Hx    Social History  Substance Use Topics  . Smoking status: Never Smoker   . Smokeless tobacco: Never Used  . Alcohol Use: No   OB History    No data available     Review of Systems  Musculoskeletal: Positive for arthralgias.  Neurological: Positive for numbness. Negative for weakness.  All other systems reviewed and are negative.     Allergies  Review of patient's allergies indicates no known allergies.  Home Medications   Prior to Admission medications   Medication Sig Start Date End Date Taking? Authorizing Provider  acetaminophen (TYLENOL) 500 MG tablet Take 500 mg by mouth every 6 (six) hours as needed for mild pain.     Historical Provider, MD   atorvastatin (LIPITOR) 80 MG tablet Take 1 tablet (80 mg total) by mouth daily at 6 PM. 07/31/14   Burnell Blanks, MD  clopidogrel (PLAVIX) 75 MG tablet Take 1 tablet (75 mg total) by mouth daily with breakfast. 01/27/14   Brett Canales, PA-C  lisinopril-hydrochlorothiazide (PRINZIDE,ZESTORETIC) 10-12.5 MG per tablet Take 1 tablet by mouth daily. 08/09/14   Burnell Blanks, MD  metoprolol succinate (TOPROL-XL) 25 MG 24 hr tablet TAKE 1 TABLET (25 MG TOTAL) BY MOUTH DAILY. TAKE WITH OR IMMEDIATELY FOLLOWING A MEAL. 01/01/15   Burnell Blanks, MD  Multiple Vitamin (MULTIVITAMIN WITH MINERALS) TABS Take 1 tablet by mouth every morning.    Historical Provider, MD  nitroGLYCERIN (NITROSTAT) 0.4 MG SL tablet Place 1 tablet (0.4 mg total) under the tongue every 5 (five) minutes x 3 doses as needed for chest pain. Patient not taking: Reported on 10/23/2014 01/27/14   Brett Canales, PA-C  traMADol (ULTRAM) 50 MG tablet Take 1 tablet (50 mg total) by mouth every 6 (six) hours as needed. Patient not taking: Reported on 10/23/2014 1/0/62   Delora Fuel, MD  warfarin (COUMADIN) 5 MG tablet Take as directed by coumadin clinic. 11/28/14   Truitt Merle, MD   BP 154/57 mmHg  Pulse 74  Temp(Src) 98.3 F (36.8 C) (Oral)  Resp 14  SpO2 100%   Physical Exam  Constitutional: She is oriented to person, place, and time. She appears well-developed and well-nourished. No distress.  HENT:  Head: Normocephalic and atraumatic.  Eyes: Conjunctivae and EOM are normal. Pupils are equal, round, and reactive to light.  Neck: Normal range of motion. Neck supple. No tracheal deviation present.  Cardiovascular: Normal rate, regular rhythm and normal heart sounds.   No murmur heard. Pulmonary/Chest: Effort normal and breath sounds normal. No respiratory distress. She has no wheezes. She has no rales.  Abdominal: Bowel sounds are normal. She exhibits no distension. There is no tenderness. There is no rebound and no  guarding.  Musculoskeletal: Normal range of motion. She exhibits tenderness.  Left shoulder appears grossly normal, tender over the Ac joint and lateral shoulder. There is pain with ROM.  Neurological: She is alert and oriented to person, place, and time. No cranial nerve deficit. She exhibits normal muscle tone. Coordination normal.  Skin: Skin is warm and dry.  Psychiatric: She has a normal mood and affect. Her behavior is normal.  Nursing note and vitals reviewed.   ED Course  Procedures (including critical care time) DIAGNOSTIC STUDIES: Oxygen Saturation is 98% on RA, normal by my interpretation.    COORDINATION OF CARE: 2:12 AM-Discussed treatment plan with pt at bedside and pt agreed to plan.     Labs  Review Labs Reviewed  PROTIME-INR  APTT  CBC  DIFFERENTIAL  COMPREHENSIVE METABOLIC PANEL  I-STAT TROPOININ, ED  CBG MONITORING, ED  I-STAT CHEM 8, ED    Imaging Review No results found. I have personally reviewed and evaluated these images and lab results as part of my medical decision-making.   EKG Interpretation   Date/Time:  Sunday January 05 2015 02:03:09 EDT Ventricular Rate:  75 PR Interval:  258 QRS Duration: 115 QT Interval:  395 QTC Calculation: 441 R Axis:   -53 Text Interpretation:  Sinus rhythm Prolonged PR interval Left anterior  fascicular block Consider anterior infarct Confirmed by Bonnetta Allbee  MD, Rae Plotner  ([id number]) on 01/05/2015 4:39:38 AM      MDM   Final diagnoses:  None      Patient presents here with complaints of left shoulder pain. This started yesterday in the absence of any injury or trauma. She has pain with any range of motion and palpation of the shoulder. The left upper extremity is otherwise neurovascularly intact. I highly suspect a musculoskeletal etiology. The x-rays do not reveal any acute process. She will be treated with an arm sling and pain medication.  Patient was also complaining of left facial numbness that started  earlier this evening. She is neurologically intact. She has no strength deficits or cranial nerve deficits. I see no facial droop and her speech is easily comprehendible. She is on Lasix and also Coumadin with a therapeutic INR and I highly doubt an acute CVA. Her CT scan of her head is unremarkable. I don't feel as though further workup is indicated into this at this time.   I personally performed the services described in this documentation, which was scribed in my presence. The recorded information has been reviewed and is accurate.      Veryl Speak, MD 01/05/15 [phone]

## 2015-01-15 ENCOUNTER — Ambulatory Visit (INDEPENDENT_AMBULATORY_CARE_PROVIDER_SITE_OTHER): Payer: Medicare Other | Admitting: *Deleted

## 2015-01-15 DIAGNOSIS — I82409 Acute embolism and thrombosis of unspecified deep veins of unspecified lower extremity: Secondary | ICD-10-CM

## 2015-01-15 LAB — POCT INR: INR: 3.5

## 2015-01-27 ENCOUNTER — Other Ambulatory Visit: Payer: Self-pay | Admitting: Cardiovascular Disease

## 2015-01-29 ENCOUNTER — Ambulatory Visit (INDEPENDENT_AMBULATORY_CARE_PROVIDER_SITE_OTHER): Payer: Medicare Other | Admitting: *Deleted

## 2015-01-29 DIAGNOSIS — I82409 Acute embolism and thrombosis of unspecified deep veins of unspecified lower extremity: Secondary | ICD-10-CM | POA: Diagnosis not present

## 2015-01-29 LAB — POCT INR: INR: 3.8

## 2015-02-03 DIAGNOSIS — E1165 Type 2 diabetes mellitus with hyperglycemia: Secondary | ICD-10-CM | POA: Diagnosis not present

## 2015-02-03 DIAGNOSIS — E782 Mixed hyperlipidemia: Secondary | ICD-10-CM | POA: Diagnosis not present

## 2015-02-03 DIAGNOSIS — I2510 Atherosclerotic heart disease of native coronary artery without angina pectoris: Secondary | ICD-10-CM | POA: Diagnosis not present

## 2015-02-03 DIAGNOSIS — I10 Essential (primary) hypertension: Secondary | ICD-10-CM | POA: Diagnosis not present

## 2015-02-10 DIAGNOSIS — I10 Essential (primary) hypertension: Secondary | ICD-10-CM | POA: Diagnosis not present

## 2015-02-10 DIAGNOSIS — Z23 Encounter for immunization: Secondary | ICD-10-CM | POA: Diagnosis not present

## 2015-02-10 DIAGNOSIS — E1165 Type 2 diabetes mellitus with hyperglycemia: Secondary | ICD-10-CM | POA: Diagnosis not present

## 2015-02-10 DIAGNOSIS — E782 Mixed hyperlipidemia: Secondary | ICD-10-CM | POA: Diagnosis not present

## 2015-02-10 DIAGNOSIS — I2510 Atherosclerotic heart disease of native coronary artery without angina pectoris: Secondary | ICD-10-CM | POA: Diagnosis not present

## 2015-02-12 ENCOUNTER — Ambulatory Visit (INDEPENDENT_AMBULATORY_CARE_PROVIDER_SITE_OTHER): Payer: Medicare Other | Admitting: *Deleted

## 2015-02-12 DIAGNOSIS — I82409 Acute embolism and thrombosis of unspecified deep veins of unspecified lower extremity: Secondary | ICD-10-CM | POA: Diagnosis not present

## 2015-02-12 LAB — POCT INR: INR: 2.3

## 2015-02-21 ENCOUNTER — Other Ambulatory Visit: Payer: Self-pay | Admitting: Cardiovascular Disease

## 2015-02-26 ENCOUNTER — Other Ambulatory Visit: Payer: Self-pay | Admitting: Hematology and Oncology

## 2015-02-26 ENCOUNTER — Ambulatory Visit (INDEPENDENT_AMBULATORY_CARE_PROVIDER_SITE_OTHER): Payer: Medicare Other | Admitting: *Deleted

## 2015-02-26 DIAGNOSIS — I82409 Acute embolism and thrombosis of unspecified deep veins of unspecified lower extremity: Secondary | ICD-10-CM

## 2015-02-26 LAB — POCT INR: INR: 2.7

## 2015-03-06 NOTE — Progress Notes (Signed)
Chief Complaint  Patient presents with  . Follow-up    cad/pt has no complaints, pt would like to discuss anxiety issues     History of Present Illness: 74 yo female with history of CAD with inferior STEMI October 2015, HTN, DVT here today for cardiac follow up. I met her on 01/30/14 when she presented with an inferior STEMI. A bare metal stent was placed in the sub-totally occluded proximal RCA. She is lifelong coumadin therapy for recurrent DVT. Of note, her presentation with MI in October 9758 was complicated by ventricular fibrillation arrest in the ED with successful defibrillation and CPR. Her Circumflex and LAD had no disease. LVEF was preserved.   She is here today for follow up. She is feeling well. No chest pain or SOB. .   Primary Care Physician: Ashby Dawes  Past Medical History  Diagnosis Date  . Hypertension   . Hemorrhoid   . GERD (gastroesophageal reflux disease)   . Adenomatous polyps   . DVT (deep venous thrombosis) ([place]) 2003    lower extremity DVT with questionable recurrence in 2005. Treated with 1.5 year course of coumadin.  [name] History of TIAs     2004 - no documentation available. 09/2010 -  was not on aspirin or statin therapy prior to event.  . Endometrial thickening on ultra sound 07/2009    path showing Fragments of benign endocervical and squamous mucosa. No dysplasia or malignancy // Followed by Dr. [name]  . Coronary artery disease, non-occlusive 11/2003; 01/2014    LHC (11/2003) - LAD diffuse 30% stenosis of mid-vessel. RCA with 30% stenosis of proximal vessel - by Dr. Dannielle Burn. // 2D Echo (04/2006) -  LV EF 65%.  Mild LVH. No  wall motion abnormalities.  Mild diastolic dysfunction. 2015 with STEMI- stent palced  . Prediabetes DX: 09/2010    HgA1c 6.1  . Stroke ([place])   . Syncope 12/18/2013  . Hx of myocardial infarction less than 8 weeks 01/24/14  . Hyperlipidemia LDL goal <70     Past Surgical History  Procedure Laterality Date  . Tubal  ligation    . Knee surgery  1996    Left knee arthroscopy.  . Colonoscopy w/ polypectomy  11/01/2007    8 mm rectal adenoma, hemorrhoids  . Rotator cuff repair      right.  . Coronary angioplasty with stent placement  01/24/14    Inf. STEMI, BMS to RCA  . Left heart catheterization with coronary angiogram N/A 01/24/2014    Procedure: LEFT HEART CATHETERIZATION WITH CORONARY ANGIOGRAM;  Surgeon: Burnell Blanks, MD;  Location: [facility] CATH LAB;  Service: Cardiovascular;  Laterality: N/A;    Current Outpatient Prescriptions  Medication Sig Dispense Refill  . acetaminophen (TYLENOL) 500 MG tablet Take 500 mg by mouth every 6 (six) hours as needed for mild pain.     [name] atorvastatin (LIPITOR) 80 MG tablet Take 1 tablet (80 mg total) by mouth daily at 6 PM. 30 tablet 5  . HYDROcodone-acetaminophen (NORCO) 5-325 MG tablet Take 1-2 tablets by mouth every 6 (six) hours as needed. 20 tablet 0  . lisinopril-hydrochlorothiazide (PRINZIDE,ZESTORETIC) 10-12.5 MG per tablet Take 1 tablet by mouth daily. 30 tablet 11  . metoprolol succinate (TOPROL-XL) 25 MG 24 hr tablet TAKE 1 TABLET (25 MG TOTAL) BY MOUTH DAILY. TAKE WITH OR IMMEDIATELY FOLLOWING A MEAL. 30 tablet 3  . Multiple Vitamin (MULTIVITAMIN WITH MINERALS) TABS Take 1 tablet by mouth every morning.    . nitroGLYCERIN (  NITROSTAT) 0.4 MG SL tablet Place 1 tablet (0.4 mg total) under the tongue every 5 (five) minutes x 3 doses as needed for chest pain. 25 tablet 6  . traMADol (ULTRAM) 50 MG tablet Take 1 tablet (50 mg total) by mouth every 6 (six) hours as needed. 15 tablet 0  . warfarin (COUMADIN) 5 MG tablet Take as directed by coumadin clinic. (Patient taking differently: Take 5-7.5 mg by mouth daily. 53m MWF, 7.578mall other days) 45 tablet 3  . aspirin EC 81 MG tablet Take 1 tablet (81 mg total) by mouth daily. 90 tablet 3   No current facility-administered medications for this visit.    No Known Allergies  Social History   Social  History  . Marital Status: Married    Spouse Name: N/A  . Number of Children: 6  . Years of Education: College   Occupational History  . teControl and instrumentation engineer . [employer]   Social History Main Topics  . Smoking status: Never Smoker   . Smokeless tobacco: Never Used  . Alcohol Use: No  . Drug Use: No  . Sexual Activity: Not on file   Other Topics Concern  . Not on file   Social History Narrative   Full Code status.   Insurance: BCBS    Family History  Problem Relation Age of Onset  . Thyroid cancer Sister   . Liver cancer Sister   . Diabetes Mother   . Hyperlipidemia Mother   . Heart disease Mother   . Dementia Mother   . Angina Father   . Heart disease Brother     x 2 in 5073so  . Alzheimer's disease Maternal Grandmother   . Heart attack Father   . Stroke Neg Hx     Review of Systems:  As stated in the HPI and otherwise negative.   BP 140/82 mmHg  Pulse 67  Ht _0  (1.6 m)  Wt 259 lb 6.4 oz (117.663 kg)  BMI 45.96 kg/m2  SpO2 98%  Physical Examination: General: Well developed, well nourished, NAD HEENT: OP clear, mucus membranes moist SKIN: warm, dry. No rashes. Neuro: No focal deficits Musculoskeletal: Muscle strength 5/5 all ext Psychiatric: Mood and affect normal Neck: No JVD, no carotid bruits, no thyromegaly, no lymphadenopathy. Lungs:Clear bilaterally, no wheezes, rhonci, crackles Cardiovascular: Regular rate and rhythm. No murmurs, gallops or rubs. Abdomen:Soft. Bowel sounds present. Non-tender.  Extremities: No lower extremity edema. Pulses are 2 + in the bilateral DP/PT.  Echo 01/24/14:  Left ventricle: The cavity size was normal. There was mild concentric hypertrophy. Systolic function was normal. The estimated ejection fraction was in the range of 55% to 60%. Wall motion was normal; there were no regional wall motion abnormalities. - Aortic valve: There was trivial regurgitation. -  Ascending aorta: The ascending aorta was mildly dilated. - Mitral valve: Calcified annulus.  Cardiac cath 01/24/14: Left main: No obstructive disease.  Left Anterior Descending Artery: Large caliber vessel that courses to the apex. Moderate caliber diagonal branch. No obstructive disease.  Circumflex Artery: Large caliber vessel with large obtuse marginal branch. No obstructive disease.  Right Coronary Artery: Moderate caliber co-dominant vessel with sub-totally occluded proximal vessel.  Left Ventricular Angiogram: LVEF=50%  EKG:  EKG is not ordered today. The ekg ordered today demonstrates   Recent Labs: 01/05/2015: ALT 24; BUN 18; Creatinine, Ser 0.70; Hemoglobin 14.3; Platelets 248; Potassium 5.4*; Sodium 133*   Lipid Panel    Component Value  Date/Time   CHOL 107 03/19/2014 0745   TRIG 110.0 03/19/2014 0745   HDL 24.40* 03/19/2014 0745   CHOLHDL 4 03/19/2014 0745   VLDL 22.0 03/19/2014 0745   LDLCALC 61 03/19/2014 0745     Wt Readings from Last 3 Encounters:  03/07/15 259 lb 6.4 oz (117.663 kg)  10/23/14 261 lb 14.4 oz (118.797 kg)  08/11/14 264 lb (119.75 kg)     Other studies Reviewed: Additional studies/ records that were reviewed today include: . Review of the above records demonstrates:    Assessment and Plan:   1. CAD: Stable. Continue beta blocker, statin. Stop Plavix. Start ASA 81 mg daily.   2. HTN: BP controlled. No changes  3. DVT: Lifelong coumadin therapy.   4. HLD: continue statin. Lipids well controlled per pt in primary care.   Current medicines are reviewed at length with the patient today.  The patient does not have concerns regarding medicines.  The following changes have been made:  no change  Labs/ tests ordered today include:  No orders of the defined types were placed in this encounter.    Disposition:   FU with me in 6 months  Signed, Lauree Chandler, MD 03/07/2015 5:09 PM    [facility] [place], [place], [place]  [address] Phone: [phone]; Fax: [phone]

## 2015-03-07 ENCOUNTER — Encounter: Payer: Self-pay | Admitting: Cardiovascular Disease

## 2015-03-07 ENCOUNTER — Ambulatory Visit (INDEPENDENT_AMBULATORY_CARE_PROVIDER_SITE_OTHER): Payer: Medicare Other | Admitting: Cardiovascular Disease

## 2015-03-07 VITALS — BP 140/82 | HR 67 | Ht 63.0 in | Wt 259.4 lb

## 2015-03-07 DIAGNOSIS — I2510 Atherosclerotic heart disease of native coronary artery without angina pectoris: Secondary | ICD-10-CM

## 2015-03-07 DIAGNOSIS — E785 Hyperlipidemia, unspecified: Secondary | ICD-10-CM | POA: Diagnosis not present

## 2015-03-07 DIAGNOSIS — I10 Essential (primary) hypertension: Secondary | ICD-10-CM | POA: Diagnosis not present

## 2015-03-07 MED ORDER — ASPIRIN EC 81 MG PO TBEC: 81.0000 mg | DELAYED_RELEASE_TABLET | Freq: Every day | ORAL | Status: DC

## 2015-03-07 MED ORDER — NITROGLYCERIN 0.4 MG SL SUBL: 0.4000 mg | SUBLINGUAL_TABLET | SUBLINGUAL | Status: DC | PRN

## 2015-03-07 NOTE — Patient Instructions (Signed)
Medication Instructions:  Your physician has recommended you make the following change in your medication: Stop Clopidogrel. Start Aspirin 81 mg by mouth daily.   Labwork: none  Testing/Procedures: none  Follow-Up: Your physician wants you to follow-up in: 6 months.  You will receive a reminder letter in the mail two months in advance. If you don't receive a letter, please call our office to schedule the follow-up appointment.   Any Other Special Instructions Will Be Listed Below (If Applicable).     If you need a refill on your cardiac medications before your next appointment, please call your pharmacy.

## 2015-03-19 ENCOUNTER — Ambulatory Visit (INDEPENDENT_AMBULATORY_CARE_PROVIDER_SITE_OTHER): Payer: Medicare Other | Admitting: *Deleted

## 2015-03-19 DIAGNOSIS — I82409 Acute embolism and thrombosis of unspecified deep veins of unspecified lower extremity: Secondary | ICD-10-CM | POA: Diagnosis not present

## 2015-03-19 LAB — POCT INR: INR: 2.6

## 2015-03-28 ENCOUNTER — Other Ambulatory Visit: Payer: Self-pay | Admitting: Hematology

## 2015-04-02 ENCOUNTER — Other Ambulatory Visit: Payer: Self-pay | Admitting: Hematology

## 2015-04-16 ENCOUNTER — Ambulatory Visit (INDEPENDENT_AMBULATORY_CARE_PROVIDER_SITE_OTHER): Payer: Medicare Other | Admitting: Pharmacist

## 2015-04-16 DIAGNOSIS — I82409 Acute embolism and thrombosis of unspecified deep veins of unspecified lower extremity: Secondary | ICD-10-CM

## 2015-04-16 LAB — POCT INR: INR: 2.3

## 2015-04-23 ENCOUNTER — Other Ambulatory Visit: Payer: Self-pay | Admitting: Cardiovascular Disease

## 2015-04-30 ENCOUNTER — Ambulatory Visit (INDEPENDENT_AMBULATORY_CARE_PROVIDER_SITE_OTHER): Payer: Medicare Other | Admitting: Pharmacist

## 2015-04-30 DIAGNOSIS — I82409 Acute embolism and thrombosis of unspecified deep veins of unspecified lower extremity: Secondary | ICD-10-CM

## 2015-04-30 LAB — POCT INR: INR: 2.9

## 2015-05-07 ENCOUNTER — Other Ambulatory Visit: Payer: Self-pay | Admitting: Cardiovascular Disease

## 2015-05-21 ENCOUNTER — Ambulatory Visit (INDEPENDENT_AMBULATORY_CARE_PROVIDER_SITE_OTHER): Payer: Medicare Other | Admitting: *Deleted

## 2015-05-21 DIAGNOSIS — I82409 Acute embolism and thrombosis of unspecified deep veins of unspecified lower extremity: Secondary | ICD-10-CM | POA: Diagnosis not present

## 2015-05-21 LAB — POCT INR: INR: 2.4

## 2015-05-27 DIAGNOSIS — F419 Anxiety disorder, unspecified: Secondary | ICD-10-CM | POA: Diagnosis not present

## 2015-05-27 DIAGNOSIS — F321 Major depressive disorder, single episode, moderate: Secondary | ICD-10-CM | POA: Diagnosis not present

## 2015-05-27 DIAGNOSIS — I10 Essential (primary) hypertension: Secondary | ICD-10-CM | POA: Diagnosis not present

## 2015-05-29 DIAGNOSIS — H25099 Other age-related incipient cataract, unspecified eye: Secondary | ICD-10-CM | POA: Diagnosis not present

## 2015-05-30 DIAGNOSIS — H25099 Other age-related incipient cataract, unspecified eye: Secondary | ICD-10-CM | POA: Diagnosis not present

## 2015-06-04 ENCOUNTER — Ambulatory Visit (INDEPENDENT_AMBULATORY_CARE_PROVIDER_SITE_OTHER): Payer: Medicare Other | Admitting: *Deleted

## 2015-06-04 DIAGNOSIS — I82409 Acute embolism and thrombosis of unspecified deep veins of unspecified lower extremity: Secondary | ICD-10-CM | POA: Diagnosis not present

## 2015-06-04 LAB — POCT INR: INR: 2

## 2015-06-09 ENCOUNTER — Other Ambulatory Visit: Payer: Self-pay | Admitting: *Deleted

## 2015-06-09 MED ORDER — ATORVASTATIN CALCIUM 80 MG PO TABS: ORAL_TABLET | ORAL | Status: DC

## 2015-06-10 ENCOUNTER — Other Ambulatory Visit: Payer: Self-pay

## 2015-06-10 MED ORDER — METOPROLOL SUCCINATE ER 25 MG PO TB24: ORAL_TABLET | ORAL | Status: DC

## 2015-06-18 ENCOUNTER — Ambulatory Visit (INDEPENDENT_AMBULATORY_CARE_PROVIDER_SITE_OTHER): Payer: Medicare Other | Admitting: *Deleted

## 2015-06-18 DIAGNOSIS — I82409 Acute embolism and thrombosis of unspecified deep veins of unspecified lower extremity: Secondary | ICD-10-CM | POA: Diagnosis not present

## 2015-06-18 DIAGNOSIS — Z1231 Encounter for screening mammogram for malignant neoplasm of breast: Secondary | ICD-10-CM | POA: Diagnosis not present

## 2015-06-18 LAB — POCT INR: INR: 2

## 2015-06-20 ENCOUNTER — Encounter: Payer: Self-pay | Admitting: Internal Medicine

## 2015-07-02 ENCOUNTER — Ambulatory Visit (INDEPENDENT_AMBULATORY_CARE_PROVIDER_SITE_OTHER): Payer: Medicare Other | Admitting: Pharmacist

## 2015-07-02 DIAGNOSIS — I82409 Acute embolism and thrombosis of unspecified deep veins of unspecified lower extremity: Secondary | ICD-10-CM | POA: Diagnosis not present

## 2015-07-02 LAB — POCT INR: INR: 3.1

## 2015-07-14 DIAGNOSIS — I10 Essential (primary) hypertension: Secondary | ICD-10-CM | POA: Diagnosis not present

## 2015-07-14 DIAGNOSIS — E1165 Type 2 diabetes mellitus with hyperglycemia: Secondary | ICD-10-CM | POA: Diagnosis not present

## 2015-07-14 DIAGNOSIS — I2510 Atherosclerotic heart disease of native coronary artery without angina pectoris: Secondary | ICD-10-CM | POA: Diagnosis not present

## 2015-07-14 DIAGNOSIS — E782 Mixed hyperlipidemia: Secondary | ICD-10-CM | POA: Diagnosis not present

## 2015-07-21 ENCOUNTER — Other Ambulatory Visit: Payer: Self-pay | Admitting: Internal Medicine

## 2015-07-21 DIAGNOSIS — R945 Abnormal results of liver function studies: Secondary | ICD-10-CM

## 2015-07-21 DIAGNOSIS — R7989 Other specified abnormal findings of blood chemistry: Secondary | ICD-10-CM

## 2015-07-21 DIAGNOSIS — E1165 Type 2 diabetes mellitus with hyperglycemia: Secondary | ICD-10-CM | POA: Diagnosis not present

## 2015-07-21 DIAGNOSIS — F321 Major depressive disorder, single episode, moderate: Secondary | ICD-10-CM | POA: Diagnosis not present

## 2015-07-21 DIAGNOSIS — E782 Mixed hyperlipidemia: Secondary | ICD-10-CM | POA: Diagnosis not present

## 2015-07-21 DIAGNOSIS — I10 Essential (primary) hypertension: Secondary | ICD-10-CM | POA: Diagnosis not present

## 2015-07-23 ENCOUNTER — Ambulatory Visit (INDEPENDENT_AMBULATORY_CARE_PROVIDER_SITE_OTHER): Payer: Medicare Other | Admitting: *Deleted

## 2015-07-23 DIAGNOSIS — I82409 Acute embolism and thrombosis of unspecified deep veins of unspecified lower extremity: Secondary | ICD-10-CM

## 2015-07-23 LAB — POCT INR: INR: 2.9

## 2015-08-13 ENCOUNTER — Ambulatory Visit (INDEPENDENT_AMBULATORY_CARE_PROVIDER_SITE_OTHER): Payer: Medicare Other | Admitting: *Deleted

## 2015-08-13 DIAGNOSIS — I82409 Acute embolism and thrombosis of unspecified deep veins of unspecified lower extremity: Secondary | ICD-10-CM

## 2015-08-13 LAB — POCT INR: INR: 2.6

## 2015-08-29 ENCOUNTER — Other Ambulatory Visit: Payer: Self-pay | Admitting: Cardiovascular Disease

## 2015-09-10 ENCOUNTER — Ambulatory Visit (INDEPENDENT_AMBULATORY_CARE_PROVIDER_SITE_OTHER): Payer: Medicare Other | Admitting: *Deleted

## 2015-09-10 DIAGNOSIS — I82409 Acute embolism and thrombosis of unspecified deep veins of unspecified lower extremity: Secondary | ICD-10-CM | POA: Diagnosis not present

## 2015-09-10 LAB — POCT INR: INR: 2.6

## 2015-10-08 ENCOUNTER — Ambulatory Visit (INDEPENDENT_AMBULATORY_CARE_PROVIDER_SITE_OTHER): Payer: Medicare Other | Admitting: Pharmacist

## 2015-10-08 DIAGNOSIS — I82409 Acute embolism and thrombosis of unspecified deep veins of unspecified lower extremity: Secondary | ICD-10-CM

## 2015-10-08 LAB — POCT INR: INR: 2.7

## 2015-10-27 ENCOUNTER — Other Ambulatory Visit: Payer: Self-pay | Admitting: Cardiovascular Disease

## 2015-11-06 DIAGNOSIS — N951 Menopausal and female climacteric states: Secondary | ICD-10-CM | POA: Diagnosis not present

## 2015-11-19 ENCOUNTER — Ambulatory Visit (INDEPENDENT_AMBULATORY_CARE_PROVIDER_SITE_OTHER): Payer: Medicare Other | Admitting: Pharmacist

## 2015-11-19 DIAGNOSIS — I82409 Acute embolism and thrombosis of unspecified deep veins of unspecified lower extremity: Secondary | ICD-10-CM | POA: Diagnosis not present

## 2015-11-19 LAB — POCT INR: INR: 2.8

## 2015-12-05 ENCOUNTER — Other Ambulatory Visit: Payer: Self-pay

## 2015-12-09 DIAGNOSIS — R7989 Other specified abnormal findings of blood chemistry: Secondary | ICD-10-CM | POA: Diagnosis not present

## 2015-12-09 DIAGNOSIS — E1165 Type 2 diabetes mellitus with hyperglycemia: Secondary | ICD-10-CM | POA: Diagnosis not present

## 2015-12-09 DIAGNOSIS — I10 Essential (primary) hypertension: Secondary | ICD-10-CM | POA: Diagnosis not present

## 2015-12-09 DIAGNOSIS — E782 Mixed hyperlipidemia: Secondary | ICD-10-CM | POA: Diagnosis not present

## 2015-12-09 DIAGNOSIS — N390 Urinary tract infection, site not specified: Secondary | ICD-10-CM | POA: Diagnosis not present

## 2015-12-15 DIAGNOSIS — Z23 Encounter for immunization: Secondary | ICD-10-CM | POA: Diagnosis not present

## 2015-12-15 DIAGNOSIS — I2510 Atherosclerotic heart disease of native coronary artery without angina pectoris: Secondary | ICD-10-CM | POA: Diagnosis not present

## 2015-12-15 DIAGNOSIS — N390 Urinary tract infection, site not specified: Secondary | ICD-10-CM | POA: Diagnosis not present

## 2015-12-15 DIAGNOSIS — E782 Mixed hyperlipidemia: Secondary | ICD-10-CM | POA: Diagnosis not present

## 2015-12-15 DIAGNOSIS — E1165 Type 2 diabetes mellitus with hyperglycemia: Secondary | ICD-10-CM | POA: Diagnosis not present

## 2015-12-28 ENCOUNTER — Other Ambulatory Visit: Payer: Self-pay | Admitting: Cardiovascular Disease

## 2015-12-31 ENCOUNTER — Ambulatory Visit (INDEPENDENT_AMBULATORY_CARE_PROVIDER_SITE_OTHER): Payer: Medicare Other | Admitting: *Deleted

## 2015-12-31 DIAGNOSIS — I82409 Acute embolism and thrombosis of unspecified deep veins of unspecified lower extremity: Secondary | ICD-10-CM

## 2015-12-31 LAB — POCT INR: INR: 3.3

## 2016-01-06 NOTE — Progress Notes (Deleted)
  No chief complaint on file.   History of Present Illness: 75 yo female with history of CAD with inferior STEMI October 2015, HTN, DVT here today for cardiac follow up. I met her on 01/30/14 when she presented with an inferior STEMI. A bare metal stent was placed in the sub-totally occluded proximal RCA. She is on lifelong coumadin therapy for recurrent DVT. Of note, her presentation with MI in October 2015 was complicated by ventricular fibrillation arrest in the ED with successful defibrillation and CPR. Her Circumflex and LAD had no disease. LVEF was preserved.   She is here today for follow up. She is feeling well. No chest pain or SOB. .   Primary Care Physician: RAMACHANDRAN,AJITH, MD  Past Medical History:  Diagnosis Date  . Adenomatous polyps   . Coronary artery disease, non-occlusive 11/2003; 01/2014   LHC (11/2003) - LAD diffuse 30% stenosis of mid-vessel. RCA with 30% stenosis of proximal vessel - by Dr. DeGent. // 2D Echo (04/2006) -  LV EF 65%.  Mild LVH. No  wall motion abnormalities.  Mild diastolic dysfunction. 2015 with STEMI- stent palced  . DVT (deep venous thrombosis) ([facility]) 2003   lower extremity DVT with questionable recurrence in 2005. Treated with 1.5 year course of coumadin.  . Endometrial thickening on ultra sound 07/2009   path showing Fragments of benign endocervical and squamous mucosa. No dysplasia or malignancy // Followed by Dr. Allan Ross  . GERD (gastroesophageal reflux disease)   . Hemorrhoid   . History of TIAs    2004 - no documentation available. 09/2010 -  was not on aspirin or statin therapy prior to event.  . Hx of myocardial infarction less than 8 weeks 01/24/14  . Hyperlipidemia LDL goal <70   . Hypertension   . Prediabetes DX: 09/2010   HgA1c 6.1  . Stroke ([facility])   . Syncope 12/18/2013    Past Surgical History:  Procedure Laterality Date  . COLONOSCOPY W/ POLYPECTOMY  11/01/2007   8 mm rectal adenoma, hemorrhoids  . CORONARY  ANGIOPLASTY WITH STENT PLACEMENT  01/24/14   Inf. STEMI, BMS to RCA  . KNEE SURGERY  1996   Left knee arthroscopy.  . LEFT HEART CATHETERIZATION WITH CORONARY ANGIOGRAM N/A 01/24/2014   Procedure: LEFT HEART CATHETERIZATION WITH CORONARY ANGIOGRAM;  Surgeon: Christopher D McAlhany, MD;  Location: [facility] CATH LAB;  Service: Cardiovascular;  Laterality: N/A;  . ROTATOR CUFF REPAIR     right.  . TUBAL LIGATION      Current Outpatient Prescriptions  Medication Sig Dispense Refill  . acetaminophen (TYLENOL) 500 MG tablet Take 500 mg by mouth every 6 (six) hours as needed for mild pain.     . aspirin EC 81 MG tablet Take 1 tablet (81 mg total) by mouth daily. 90 tablet 3  . atorvastatin (LIPITOR) 80 MG tablet TAKE 1 TABLET (80 MG TOTAL) BY MOUTH DAILY AT 6 PM. 90 tablet 1  . HYDROcodone-acetaminophen (NORCO) 5-325 MG tablet Take 1-2 tablets by mouth every 6 (six) hours as needed. 20 tablet 0  . lisinopril-hydrochlorothiazide (PRINZIDE,ZESTORETIC) 10-12.5 MG tablet Take 1 tablet by mouth daily. 30 tablet 2  . metoprolol succinate (TOPROL-XL) 25 MG 24 hr tablet TAKE 1 TABLET (25 MG TOTAL) BY MOUTH DAILY. TAKE WITH OR IMMEDIATELY FOLLOWING A MEAL. 90 tablet 3  . Multiple Vitamin (MULTIVITAMIN WITH MINERALS) TABS Take 1 tablet by mouth every morning.    . nitroGLYCERIN (NITROSTAT) 0.4 MG SL tablet Place 1 tablet (0.4 mg   total) under the tongue every 5 (five) minutes x 3 doses as needed for chest pain. 25 tablet 6  . traMADol (ULTRAM) 50 MG tablet Take 1 tablet (50 mg total) by mouth every 6 (six) hours as needed. 15 tablet 0  . warfarin (COUMADIN) 5 MG tablet Take as directed by coumadin clinic. (Patient taking differently: Take 5-7.5 mg by mouth daily. 89m MWF, 7.532mall other days) 45 tablet 3   No current facility-administered medications for this visit.     No Known Allergies  Social History   Social History  . Marital status: Married    Spouse name: N/A  . Number of children: 6  . 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 Smoking status: Never Smoker  . Smokeless tobacco: Never Used  . Alcohol use No  . Drug use: No  . Sexual activity: Not on file   Other Topics Concern  . Not on file   Social History Narrative   Full Code status.   Insurance: BCBS    Family History  Problem Relation Age of Onset  . Thyroid cancer Sister   . Liver cancer Sister   . Diabetes Mother   . Hyperlipidemia Mother   . Heart disease Mother   . Dementia Mother   . Angina Father   . Heart disease Brother     x 2 in 5079so  . Alzheimer's disease Maternal Grandmother   . Heart attack Father   . Stroke Neg Hx     Review of Systems:  As stated in the HPI and otherwise negative.   There were no vitals taken for this visit.  Physical Examination: General: Well developed, well nourished, NAD  HEENT: OP clear, mucus membranes moist  SKIN: warm, dry. No rashes. Neuro: No focal deficits  Musculoskeletal: Muscle strength 5/5 all ext  Psychiatric: Mood and affect normal  Neck: No JVD, no carotid bruits, no thyromegaly, no lymphadenopathy.  Lungs:Clear bilaterally, no wheezes, rhonci, crackles Cardiovascular: Regular rate and rhythm. No murmurs, gallops or rubs. Abdomen:Soft. Bowel sounds present. Non-tender.  Extremities: No lower extremity edema. Pulses are 2 + in the bilateral DP/PT.  Echo 01/24/14:  Left ventricle: The cavity size was normal. There was mild concentric hypertrophy. Systolic function was normal. The estimated ejection fraction was in the range of 55% to 60%. Wall motion was normal; there were no regional wall motion abnormalities. - Aortic valve: There was trivial regurgitation. - Ascending aorta: The ascending aorta was mildly dilated. - Mitral valve: Calcified annulus.  Cardiac cath 01/24/14: Left main: No obstructive disease.  Left  Anterior Descending Artery: Large caliber vessel that courses to the apex. Moderate caliber diagonal branch. No obstructive disease.  Circumflex Artery: Large caliber vessel with large obtuse marginal branch. No obstructive disease.  Right Coronary Artery: Moderate caliber co-dominant vessel with sub-totally occluded proximal vessel.  Left Ventricular Angiogram: LVEF=50%  EKG:  EKG is not *** ordered today. The ekg ordered today demonstrates   Recent Labs: No results found for requested labs within last 8760 hours.   Lipid Panel    Component Value Date/Time   CHOL 107 03/19/2014 0745   TRIG 110.0 03/19/2014 0745   HDL 24.40 (L) 03/19/2014 0745   CHOLHDL 4 03/19/2014 0745   VLDL 22.0 03/19/2014 0745   LDLCALC 61 03/19/2014 0745     Wt Readings from Last 3  Encounters:  03/07/15 259 lb 6.4 oz (117.7 kg)  10/23/14 261 lb 14.4 oz (118.8 kg)  08/11/14 264 lb (119.7 kg)     Other studies Reviewed: Additional studies/ records that were reviewed today include: . Review of the above records demonstrates:    Assessment and Plan:   1. CAD: No recent chest pain concerning for angina. Continue beta blocker, statin and ASA.    2. HTN: BP controlled. No changes  3. DVT: She is on lifelong coumadin therapy for recurrent DVT.   4. HLD: continue statin. Lipids well controlled per pt in primary care.   Current medicines are reviewed at length with the patient today.  The patient does not have concerns regarding medicines.  The following changes have been made:  no change  Labs/ tests ordered today include:  No orders of the defined types were placed in this encounter.   Disposition:   FU with me in 6 months  Signed, Lauree Chandler, MD 01/06/2016 1:01 PM    [facility] [place], [place], [place]  [address] Phone: [phone]; Fax: [phone]

## 2016-01-07 ENCOUNTER — Ambulatory Visit: Payer: Medicare Other | Admitting: Cardiovascular Disease

## 2016-01-14 ENCOUNTER — Encounter: Payer: Self-pay | Admitting: Cardiovascular Disease

## 2016-01-28 ENCOUNTER — Ambulatory Visit (INDEPENDENT_AMBULATORY_CARE_PROVIDER_SITE_OTHER): Payer: Medicare Other | Admitting: *Deleted

## 2016-01-28 DIAGNOSIS — I82409 Acute embolism and thrombosis of unspecified deep veins of unspecified lower extremity: Secondary | ICD-10-CM

## 2016-01-28 LAB — POCT INR: INR: 2.8

## 2016-02-05 ENCOUNTER — Encounter: Payer: Self-pay | Admitting: Cardiology

## 2016-02-05 ENCOUNTER — Other Ambulatory Visit: Payer: Self-pay | Admitting: *Deleted

## 2016-02-05 ENCOUNTER — Ambulatory Visit (INDEPENDENT_AMBULATORY_CARE_PROVIDER_SITE_OTHER): Payer: Medicare Other | Admitting: Cardiology

## 2016-02-05 VITALS — BP 140/80 | HR 58 | Ht 63.0 in | Wt 275.4 lb

## 2016-02-05 DIAGNOSIS — I2510 Atherosclerotic heart disease of native coronary artery without angina pectoris: Secondary | ICD-10-CM | POA: Diagnosis not present

## 2016-02-05 DIAGNOSIS — I82723 Chronic embolism and thrombosis of deep veins of upper extremity, bilateral: Secondary | ICD-10-CM

## 2016-02-05 MED ORDER — WARFARIN SODIUM 5 MG PO TABS: ORAL_TABLET | ORAL | 3 refills | Status: DC

## 2016-02-05 NOTE — Progress Notes (Signed)
02/05/2016 Shirley [name]   1940/12/21  [id number]  Primary Physician Merrilee Seashore, MD Primary Cardiologist: Dr. [name]    Reason for Visit/CC: F/u for CAD and HTN  HPI:  75 yo female, followed by Dr. [name], with a history of CAD with inferior STEMI October 2015 secondary to sub-totally occluded proximal RCA s/p PCI + stent, HTN, and DVT. She is on lifelong coumadin therapy. Her INRs are followed in our coumadin clinic. Of note, her presentation with MI in October [id number] was complicated by ventricular fibrillation arrest in the ED with successful defibrillation and CPR. Her Circumflex and LAD had no disease. LVEF was preserved.  She was last seen by Dr. [name] on 03/07/15. She was doing well from a cardiac standpoint. No changes were made. She was advised to f/u in 6 months but no appointment was made. Her daughter called to schedule the appointment for today. She is here today for routien f/u. No new complaints. She denies CP. No dyspnea. No dizziness, syncope, near syncope, palpitations, orthopnea or PND. EKG shows NSR. No changes from prior EKG. HR and BP are both well controlled. She is fully compliant with Coumadin. No abnormal bleeding. She admits that she is not complaint with her CPAP for OSA.     Current Meds  Medication Sig  . acetaminophen (TYLENOL) 500 MG tablet Take 500 mg by mouth every 6 (six) hours as needed for mild pain.   [name] aspirin EC 81 MG tablet Take 1 tablet (81 mg total) by mouth daily.  [name] atorvastatin (LIPITOR) 80 MG tablet TAKE 1 TABLET (80 MG TOTAL) BY MOUTH DAILY AT 6 PM.  . HYDROcodone-acetaminophen (NORCO) 5-325 MG tablet Take 1-2 tablets by mouth every 6 (six) hours as needed.  [name] lisinopril-hydrochlorothiazide (PRINZIDE,ZESTORETIC) 10-12.5 MG tablet Take 1 tablet by mouth daily.  . metoprolol succinate (TOPROL-XL) 25 MG 24 hr tablet TAKE 1 TABLET (25 MG TOTAL) BY MOUTH DAILY. TAKE WITH OR IMMEDIATELY FOLLOWING A MEAL.  . Multiple Vitamin  (MULTIVITAMIN WITH MINERALS) TABS Take 1 tablet by mouth every morning.  . nitroGLYCERIN (NITROSTAT) 0.4 MG SL tablet Place 1 tablet (0.4 mg total) under the tongue every 5 (five) minutes x 3 doses as needed for chest pain.  [name] warfarin (COUMADIN) 5 MG tablet Take as directed by coumadin clinic. (Patient taking differently: Take 5-7.5 mg by mouth daily. 5mg  MWF, 7.5mg  all other days)   No Known Allergies Past Medical History:  Diagnosis Date  . Adenomatous polyps   . Coronary artery disease, non-occlusive 11/2003; 01/2014   LHC (11/2003) - LAD diffuse 30% stenosis of mid-vessel. RCA with 30% stenosis of proximal vessel - by Dr. Dannielle Burn. // 2D Echo (04/2006) -  LV EF 65%.  Mild LVH. No  wall motion abnormalities.  Mild diastolic dysfunction. 2015 with STEMI- stent palced  . DVT (deep venous thrombosis) ([place]) 2003   lower extremity DVT with questionable recurrence in 2005. Treated with 1.5 year course of coumadin.  . Endometrial thickening on ultra sound 07/2009   path showing Fragments of benign endocervical and squamous mucosa. No dysplasia or malignancy // Followed by Dr. [name]  . GERD (gastroesophageal reflux disease)   . Hemorrhoid   . History of TIAs    2004 - no documentation available. 09/2010 -  was not on aspirin or statin therapy prior to event.  [name] Hx of myocardial infarction less than 8 weeks 01/24/14  . Hyperlipidemia LDL goal <70   . Hypertension   . Prediabetes DX: 09/2010  HgA1c 6.1  . Stroke ([place])   . Syncope 12/18/2013   Family History  Problem Relation Age of Onset  . Diabetes Mother   . Hyperlipidemia Mother   . Heart disease Mother   . Dementia Mother   . Angina Father   . Heart attack Father   . Thyroid cancer Sister   . Liver cancer Sister   . Heart disease Brother     x 2 in 56s yo  . Alzheimer's disease Maternal Grandmother   . Stroke Neg Hx    Past Surgical History:  Procedure Laterality Date  . COLONOSCOPY W/ POLYPECTOMY  11/01/2007   8 mm  rectal adenoma, hemorrhoids  . CORONARY ANGIOPLASTY WITH STENT PLACEMENT  01/24/14   Inf. STEMI, BMS to RCA  . KNEE SURGERY  1996   Left knee arthroscopy.  [name] LEFT HEART CATHETERIZATION WITH CORONARY ANGIOGRAM N/A 01/24/2014   Procedure: LEFT HEART CATHETERIZATION WITH CORONARY ANGIOGRAM;  Surgeon: Burnell Blanks, MD;  Location: [facility] CATH LAB;  Service: Cardiovascular;  Laterality: N/A;  . ROTATOR CUFF REPAIR     right.  . TUBAL LIGATION     Social History   Social History  . Marital status: Married    Spouse name: N/A  . Number of children: 6  . Years of education: College   Occupational History  . Control and instrumentation engineer   . [employer]   Social History Main Topics  . Smoking status: Never Smoker  . Smokeless tobacco: Never Used  . Alcohol use No  . Drug use: No  . Sexual activity: Not on file   Other Topics Concern  . Not on file   Social History Narrative   Full Code status.   Insurance: BCBS     Review of Systems: General: negative for chills, fever, night sweats or weight changes.  Cardiovascular: negative for chest pain, dyspnea on exertion, edema, orthopnea, palpitations, paroxysmal nocturnal dyspnea or shortness of breath Dermatological: negative for rash Respiratory: negative for cough or wheezing Urologic: negative for hematuria Abdominal: negative for nausea, vomiting, diarrhea, bright red blood per rectum, melena, or hematemesis Neurologic: negative for visual changes, syncope, or dizziness All other systems reviewed and are otherwise negative except as noted above.   Physical Exam:  Blood pressure 140/80, pulse (!) 58, height 5\' 3"  (1.6 m), weight 275 lb 6.4 oz (124.9 kg).  General appearance: alert, cooperative, no distress and morbidly obese Neck: no carotid bruit and no JVD Lungs: clear to auscultation bilaterally Heart: regular rate and rhythm, S1, S2 normal, no murmur, click, rub or gallop Extremities:  chronic LEE Pulses: 2+ and symmetric Skin: Skin color, texture, turgor normal. No rashes or lesions Neurologic: Grossly normal  EKG NSR. 58 bpm. No ischemia.   ASSESSMENT AND PLAN:   1. CAD: s/p inferior STEMI with cardiac arrest 01/2014. Treated with CPR w/ defibrillation + emergent LHC with PCI + DES of subtotal occlusion of proximal RCA. Stable w/o anginal symptomotolgy. Continue beta blocker, statin and ASA. Dr. [name] stopped her Plavix at her last OV.   2. HTN: BP controlled. No changes.  3. DVT: Lifelong coumadin therapy. INRs are followed in our Coumadin Clinic  4. HLD: continue statin. Lipids well controlled per pt in primary care.   5. OSA: long discussion was held with patient and daughter regarding need for compliance with CPAP. Patient states she will try to improve compliance.   PLAN  F/u with Dr. [name] in 6 months   Clemons Salvucci  Ladoris Gene 02/05/2016 3:34 PM

## 2016-02-05 NOTE — Patient Instructions (Signed)
Continue same medications      Your physician wants you to follow-up in 6 months with Dr.McAlhany.You will receive a reminder letter in the mail two months in advance. If you don't receive a letter, please call our office to schedule the follow-up appointment.

## 2016-02-18 DIAGNOSIS — J029 Acute pharyngitis, unspecified: Secondary | ICD-10-CM | POA: Diagnosis not present

## 2016-02-25 ENCOUNTER — Ambulatory Visit (INDEPENDENT_AMBULATORY_CARE_PROVIDER_SITE_OTHER): Payer: Medicare Other | Admitting: *Deleted

## 2016-02-25 DIAGNOSIS — I82409 Acute embolism and thrombosis of unspecified deep veins of unspecified lower extremity: Secondary | ICD-10-CM | POA: Diagnosis not present

## 2016-02-25 DIAGNOSIS — I2510 Atherosclerotic heart disease of native coronary artery without angina pectoris: Secondary | ICD-10-CM

## 2016-02-25 LAB — POCT INR: INR: 2.6

## 2016-03-02 IMAGING — CR DG CHEST 1V PORT
1 series · 1 of 1 positions shown · non-contrast
Comparison: Chest CTA 12/20/2013 and earlier.

CLINICAL DATA: 73-year-old female with chest pain status post CPR.
Initial encounter.

EXAM:
PORTABLE CHEST - 1 VIEW

[AP]
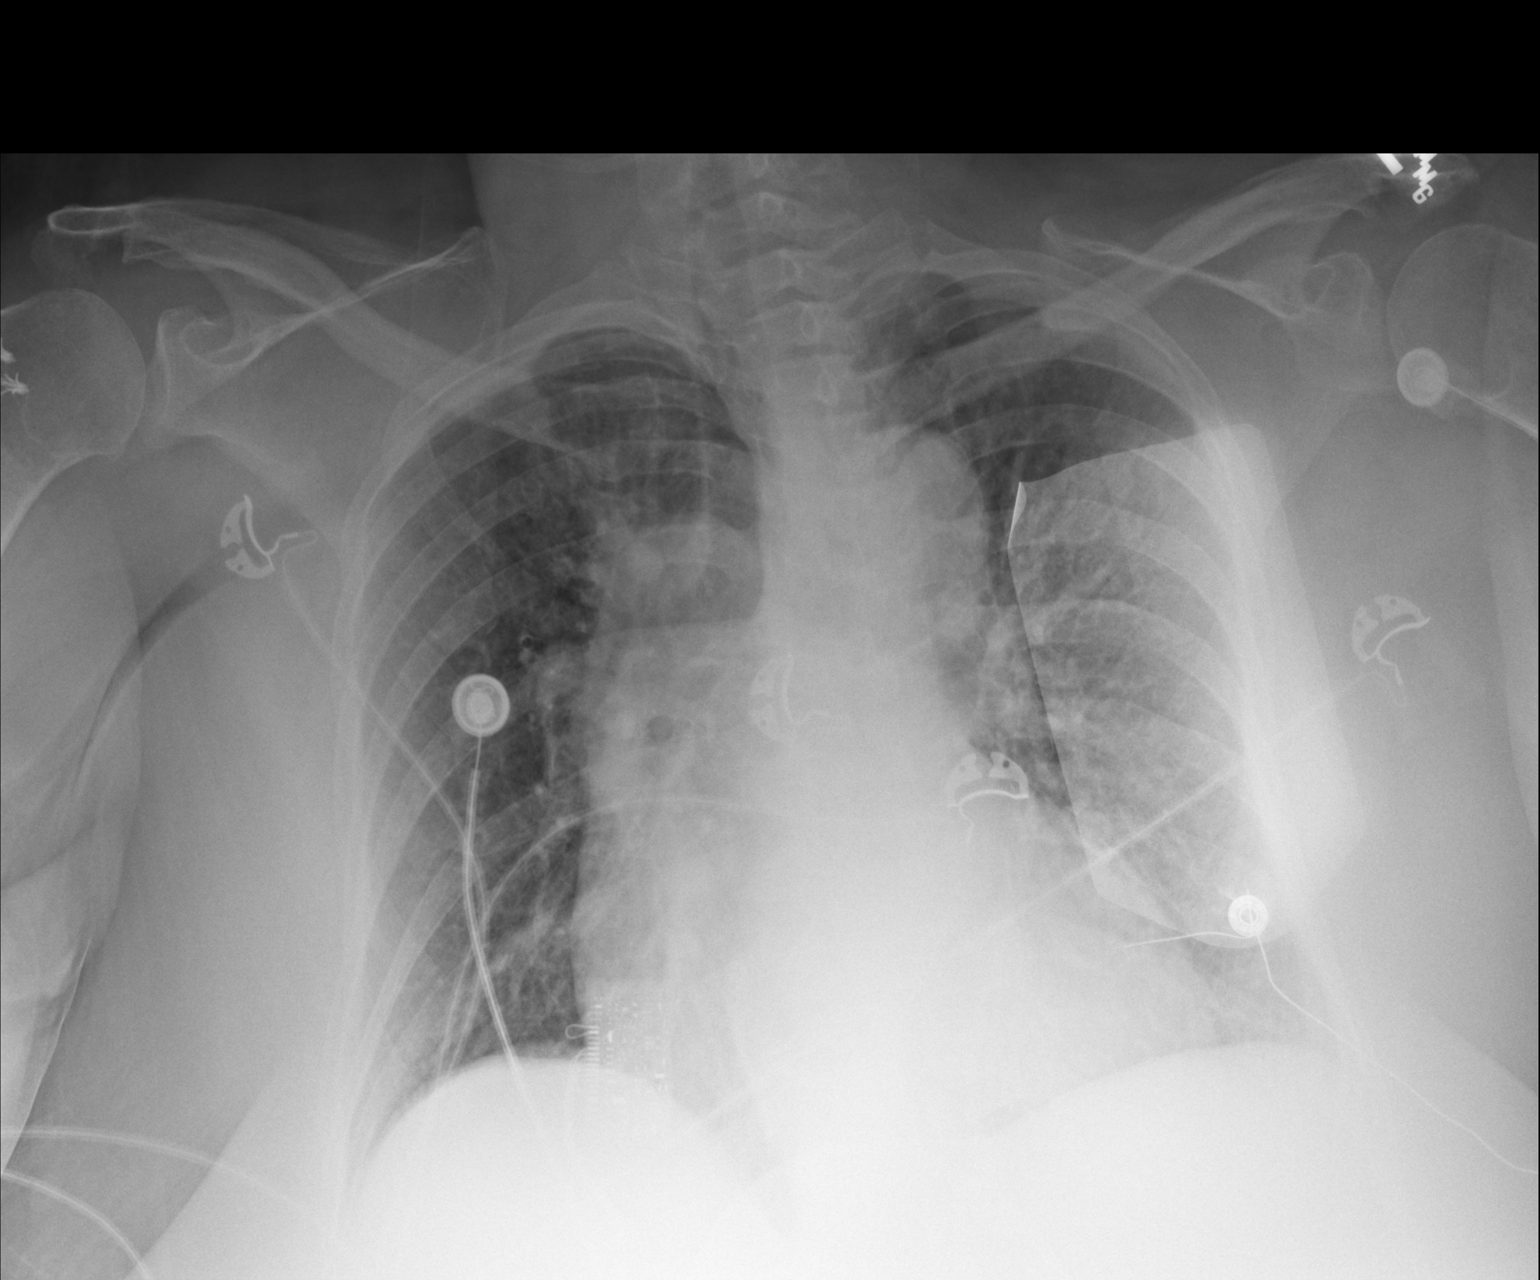

[1 of 1 positions shown; findings below may reference images not displayed]

FINDINGS: Portable AP semi upright view at 6221 hrs. Resuscitation pad. Mildly
increased pulmonary vascularity, no overt edema. Stable cardiomegaly
and mediastinal contours. Visualized tracheal air column is within
normal limits. No pneumothorax or pleural effusion. No consolidation
identified. Large body habitus.
IMPRESSION: Pulmonary vascular congestion without overt edema. No other acute
cardiopulmonary abnormality.

## 2016-03-24 ENCOUNTER — Other Ambulatory Visit: Payer: Self-pay | Admitting: *Deleted

## 2016-03-24 ENCOUNTER — Ambulatory Visit (INDEPENDENT_AMBULATORY_CARE_PROVIDER_SITE_OTHER): Payer: Medicare Other | Admitting: *Deleted

## 2016-03-24 DIAGNOSIS — I2510 Atherosclerotic heart disease of native coronary artery without angina pectoris: Secondary | ICD-10-CM

## 2016-03-24 DIAGNOSIS — I82409 Acute embolism and thrombosis of unspecified deep veins of unspecified lower extremity: Secondary | ICD-10-CM

## 2016-03-24 LAB — POCT INR: INR: 3.2

## 2016-03-24 MED ORDER — LISINOPRIL-HYDROCHLOROTHIAZIDE 10-12.5 MG PO TABS: 1.0000 | ORAL_TABLET | Freq: Every day | ORAL | 10 refills | Status: DC

## 2016-03-27 ENCOUNTER — Other Ambulatory Visit: Payer: Self-pay | Admitting: Cardiovascular Disease

## 2016-03-27 ENCOUNTER — Other Ambulatory Visit: Payer: Self-pay | Admitting: Cardiology

## 2016-03-27 MED ORDER — LISINOPRIL-HYDROCHLOROTHIAZIDE 10-12.5 MG PO TABS: 1.0000 | ORAL_TABLET | Freq: Every day | ORAL | 10 refills | Status: DC

## 2016-04-02 ENCOUNTER — Telehealth: Payer: Self-pay | Admitting: Pharmacist

## 2016-04-02 DIAGNOSIS — L03032 Cellulitis of left toe: Secondary | ICD-10-CM | POA: Diagnosis not present

## 2016-04-02 NOTE — Telephone Encounter (Signed)
Pt called to tell us that she was started on Keflex 250mg  QID. Advised her that this will not interact with her Coumdain. She will continue current dose and keep INR check on 1/10.

## 2016-04-09 DIAGNOSIS — M79675 Pain in left toe(s): Secondary | ICD-10-CM | POA: Diagnosis not present

## 2016-04-14 ENCOUNTER — Ambulatory Visit (INDEPENDENT_AMBULATORY_CARE_PROVIDER_SITE_OTHER): Payer: Medicare Other | Admitting: *Deleted

## 2016-04-14 DIAGNOSIS — I82409 Acute embolism and thrombosis of unspecified deep veins of unspecified lower extremity: Secondary | ICD-10-CM | POA: Diagnosis not present

## 2016-04-14 LAB — POCT INR: INR: 2.8

## 2016-05-12 ENCOUNTER — Ambulatory Visit (INDEPENDENT_AMBULATORY_CARE_PROVIDER_SITE_OTHER): Payer: Medicare Other | Admitting: Pharmacist

## 2016-05-12 DIAGNOSIS — I82409 Acute embolism and thrombosis of unspecified deep veins of unspecified lower extremity: Secondary | ICD-10-CM

## 2016-05-12 LAB — POCT INR: INR: 2.1

## 2016-06-02 ENCOUNTER — Ambulatory Visit (INDEPENDENT_AMBULATORY_CARE_PROVIDER_SITE_OTHER): Payer: Medicare Other | Admitting: *Deleted

## 2016-06-02 DIAGNOSIS — I82409 Acute embolism and thrombosis of unspecified deep veins of unspecified lower extremity: Secondary | ICD-10-CM

## 2016-06-02 LAB — POCT INR: INR: 4.4

## 2016-06-23 ENCOUNTER — Ambulatory Visit (INDEPENDENT_AMBULATORY_CARE_PROVIDER_SITE_OTHER): Payer: Medicare Other | Admitting: *Deleted

## 2016-06-23 DIAGNOSIS — I82409 Acute embolism and thrombosis of unspecified deep veins of unspecified lower extremity: Secondary | ICD-10-CM | POA: Diagnosis not present

## 2016-06-23 LAB — POCT INR: INR: 4.2

## 2016-06-27 ENCOUNTER — Other Ambulatory Visit: Payer: Self-pay | Admitting: Cardiovascular Disease

## 2016-07-07 ENCOUNTER — Ambulatory Visit (INDEPENDENT_AMBULATORY_CARE_PROVIDER_SITE_OTHER): Payer: Medicare Other | Admitting: *Deleted

## 2016-07-07 DIAGNOSIS — I82409 Acute embolism and thrombosis of unspecified deep veins of unspecified lower extremity: Secondary | ICD-10-CM

## 2016-07-07 LAB — POCT INR: INR: 4.6

## 2016-07-09 ENCOUNTER — Other Ambulatory Visit: Payer: Self-pay | Admitting: Cardiovascular Disease

## 2016-07-11 ENCOUNTER — Other Ambulatory Visit: Payer: Self-pay | Admitting: Cardiovascular Disease

## 2016-07-11 DIAGNOSIS — I82723 Chronic embolism and thrombosis of deep veins of upper extremity, bilateral: Secondary | ICD-10-CM

## 2016-07-21 ENCOUNTER — Ambulatory Visit (INDEPENDENT_AMBULATORY_CARE_PROVIDER_SITE_OTHER): Payer: Medicare Other | Admitting: *Deleted

## 2016-07-21 DIAGNOSIS — I82409 Acute embolism and thrombosis of unspecified deep veins of unspecified lower extremity: Secondary | ICD-10-CM

## 2016-07-21 LAB — POCT INR: INR: 1.7

## 2016-08-04 ENCOUNTER — Ambulatory Visit (INDEPENDENT_AMBULATORY_CARE_PROVIDER_SITE_OTHER): Payer: Medicare Other | Admitting: Pharmacist

## 2016-08-04 DIAGNOSIS — I82409 Acute embolism and thrombosis of unspecified deep veins of unspecified lower extremity: Secondary | ICD-10-CM

## 2016-08-04 LAB — POCT INR: INR: 2.7

## 2016-08-19 DIAGNOSIS — Z1231 Encounter for screening mammogram for malignant neoplasm of breast: Secondary | ICD-10-CM | POA: Diagnosis not present

## 2016-08-25 ENCOUNTER — Ambulatory Visit (INDEPENDENT_AMBULATORY_CARE_PROVIDER_SITE_OTHER): Payer: Medicare Other | Admitting: *Deleted

## 2016-08-25 DIAGNOSIS — I82409 Acute embolism and thrombosis of unspecified deep veins of unspecified lower extremity: Secondary | ICD-10-CM

## 2016-08-25 LAB — POCT INR: INR: 3.1

## 2016-09-07 NOTE — Progress Notes (Signed)
Chief Complaint  Patient presents with  . Follow-up    CAD    History of Present Illness: 76 yo female with history of CAD with inferior STEMI October 2015, HTN, DVT and sleep apnea here today for cardiac follow up. She was admitted to [facility] October 2015 with an inferior STEMI. A bare metal stent was placed in the sub-totally occluded proximal RCA. The LAD and Circumflex had no disease. She has been on coumadin therapy for recurrent DVT.   She is here today for follow up. The patient denies any chest pain, dyspnea, palpitations, lower extremity edema, orthopnea, PND, dizziness, near syncope or syncope.   Primary Care Physician: Merrilee Seashore, MD  Past Medical History:  Diagnosis Date  . Adenomatous polyps   . Coronary artery disease, non-occlusive 11/2003; 01/2014   LHC (11/2003) - LAD diffuse 30% stenosis of mid-vessel. RCA with 30% stenosis of proximal vessel - by Dr. Dannielle Burn. // 2D Echo (04/2006) -  LV EF 65%.  Mild LVH. No  wall motion abnormalities.  Mild diastolic dysfunction. 2015 with STEMI- stent palced  . DVT (deep venous thrombosis) ([place]) 2003   lower extremity DVT with questionable recurrence in 2005. Treated with 1.5 year course of coumadin.  . Endometrial thickening on ultra sound 07/2009   path showing Fragments of benign endocervical and squamous mucosa. No dysplasia or malignancy // Followed by Dr. [name]  . GERD (gastroesophageal reflux disease)   . Hemorrhoid   . History of TIAs    2004 - no documentation available. 09/2010 -  was not on aspirin or statin therapy prior to event.  [name] Hx of myocardial infarction less than 8 weeks 01/24/14  . Hyperlipidemia LDL goal <70   . Hypertension   . Prediabetes DX: 09/2010   HgA1c 6.1  . Stroke ([place])   . Syncope 12/18/2013    Past Surgical History:  Procedure Laterality Date  . COLONOSCOPY W/ POLYPECTOMY  11/01/2007   8 mm rectal adenoma, hemorrhoids  . CORONARY ANGIOPLASTY WITH STENT PLACEMENT  01/24/14   Inf. STEMI, BMS to RCA  . KNEE SURGERY  1996   Left knee arthroscopy.  [name] LEFT HEART CATHETERIZATION WITH CORONARY ANGIOGRAM N/A 01/24/2014   Procedure: LEFT HEART CATHETERIZATION WITH CORONARY ANGIOGRAM;  Surgeon: Burnell Blanks, MD;  Location: [facility] CATH LAB;  Service: Cardiovascular;  Laterality: N/A;  . ROTATOR CUFF REPAIR     right.  . TUBAL LIGATION      Current Outpatient Prescriptions  Medication Sig Dispense Refill  . acetaminophen (TYLENOL) 500 MG tablet Take 500 mg by mouth every 6 (six) hours as needed for mild pain.     [name] aspirin EC 81 MG tablet Take 1 tablet (81 mg total) by mouth daily. 90 tablet 3  . atorvastatin (LIPITOR) 80 MG tablet TAKE 1 TABLET BY MOUTH DAILY AT 6PM 90 tablet 2  . lisinopril-hydrochlorothiazide (PRINZIDE,ZESTORETIC) 10-12.5 MG tablet Take 1 tablet by mouth daily. 30 tablet 10  . metoprolol succinate (TOPROL-XL) 25 MG 24 hr tablet Take 1 tablet (25 mg total) by mouth daily. Take with or immediately following a meal. 90 tablet 1  . Multiple Vitamin (MULTIVITAMIN WITH MINERALS) TABS Take 1 tablet by mouth every morning.    . nitroGLYCERIN (NITROSTAT) 0.4 MG SL tablet Place 1 tablet (0.4 mg total) under the tongue every 5 (five) minutes x 3 doses as needed for chest pain. 25 tablet 6  . warfarin (COUMADIN) 5 MG tablet TAKE AS DIRECTED BY COUMADIN CLINIC.  45 tablet 3   No current facility-administered medications for this visit.     No Known Allergies  Social History   Social History  . Marital status: Married    Spouse name: N/A  . Number of children: 6  . Years of education: College   Occupational History  . Control and instrumentation engineer   . [employer]   Social History Main Topics  . Smoking status: Never Smoker  . Smokeless tobacco: Never Used  . Alcohol use No  . Drug use: No  . Sexual activity: Not on file   Other Topics Concern  . Not on file   Social History Narrative   Full Code status.    Insurance: BCBS    Family History  Problem Relation Age of Onset  . Diabetes Mother   . Hyperlipidemia Mother   . Heart disease Mother   . Dementia Mother   . Angina Father   . Heart attack Father   . Thyroid cancer Sister   . Liver cancer Sister   . Heart disease Brother        x 2 in 58s yo  . Alzheimer's disease Maternal Grandmother   . Stroke Neg Hx     Review of Systems:  As stated in the HPI and otherwise negative.   BP 136/74   Ht 5\' 3"  (1.6 m)   Wt 276 lb (125.2 kg)   SpO2 97%   BMI 48.89 kg/m   Physical Examination:  General: Well developed, well nourished, NAD  HEENT: OP clear, mucus membranes moist  SKIN: warm, dry. No rashes. Neuro: No focal deficits  Musculoskeletal: Muscle strength 5/5 all ext  Psychiatric: Mood and affect normal  Neck: No JVD, no carotid bruits, no thyromegaly, no lymphadenopathy.  Lungs:Clear bilaterally, no wheezes, rhonci, crackles Cardiovascular: Regular rate and rhythm. No murmurs, gallops or rubs. Abdomen:Soft. Bowel sounds present. Non-tender.  Extremities: No lower extremity edema. Pulses are 2 + in the bilateral DP/PT.  Echo 01/24/14:  Left ventricle: The cavity size was normal. There was mild concentric hypertrophy. Systolic function was normal. The estimated ejection fraction was in the range of 55% to 60%. Wall motion was normal; there were no regional wall motion abnormalities. - Aortic valve: There was trivial regurgitation. - Ascending aorta: The ascending aorta was mildly dilated. - Mitral valve: Calcified annulus.  Cardiac cath 01/24/14: Left main: No obstructive disease.  Left Anterior Descending Artery: Large caliber vessel that courses to the apex. Moderate caliber diagonal branch. No obstructive disease.  Circumflex Artery: Large caliber vessel with large obtuse marginal branch. No obstructive disease.  Right Coronary Artery: Moderate caliber co-dominant vessel with sub-totally occluded proximal  vessel.  Left Ventricular Angiogram: LVEF=50%  EKG:  EKG is not  ordered today. The ekg ordered today demonstrates   Recent Labs: No results found for requested labs within last 8760 hours.   Lipid Panel February 2018 LDL 47 TC 105    Wt Readings from Last 3 Encounters:  09/08/16 276 lb (125.2 kg)  02/05/16 275 lb 6.4 oz (124.9 kg)  03/07/15 259 lb 6.4 oz (117.7 kg)     Other studies Reviewed: Additional studies/ records that were reviewed today include: . Review of the above records demonstrates:    Assessment and Plan:   1. CAD without angina: She has no chest pain suggestive of angina. Will continue ASA, statin and beta blocker.  2. HTN: BP controlled. No changes.   3. DVT: She is on lifelong  coumadin therapy  4. HLD: Lipids followed in primary care. LDL 47 February 2018 in primary care. Continue statin  Current medicines are reviewed at length with the patient today.  The patient does not have concerns regarding medicines.  The following changes have been made:  no change  Labs/ tests ordered today include:  No orders of the defined types were placed in this encounter.   Disposition:   FU with me in 12 months  Signed, Lauree Chandler, MD 09/08/2016 8:48 AM    [facility] [address], [place], [place]  [address] Phone: [phone]; Fax: [phone]

## 2016-09-08 ENCOUNTER — Ambulatory Visit (INDEPENDENT_AMBULATORY_CARE_PROVIDER_SITE_OTHER): Payer: Medicare Other | Admitting: *Deleted

## 2016-09-08 ENCOUNTER — Encounter: Payer: Self-pay | Admitting: Cardiovascular Disease

## 2016-09-08 ENCOUNTER — Ambulatory Visit (INDEPENDENT_AMBULATORY_CARE_PROVIDER_SITE_OTHER): Payer: Medicare Other | Admitting: Cardiovascular Disease

## 2016-09-08 VITALS — BP 136/74 | Ht 63.0 in | Wt 276.0 lb

## 2016-09-08 DIAGNOSIS — I82409 Acute embolism and thrombosis of unspecified deep veins of unspecified lower extremity: Secondary | ICD-10-CM

## 2016-09-08 DIAGNOSIS — E7800 Pure hypercholesterolemia, unspecified: Secondary | ICD-10-CM

## 2016-09-08 DIAGNOSIS — I10 Essential (primary) hypertension: Secondary | ICD-10-CM

## 2016-09-08 DIAGNOSIS — I2510 Atherosclerotic heart disease of native coronary artery without angina pectoris: Secondary | ICD-10-CM | POA: Diagnosis not present

## 2016-09-08 LAB — POCT INR: INR: 2.3

## 2016-09-08 NOTE — Patient Instructions (Signed)

## 2016-09-22 ENCOUNTER — Ambulatory Visit (INDEPENDENT_AMBULATORY_CARE_PROVIDER_SITE_OTHER): Payer: Medicare Other | Admitting: Pharmacist

## 2016-09-22 DIAGNOSIS — I82409 Acute embolism and thrombosis of unspecified deep veins of unspecified lower extremity: Secondary | ICD-10-CM | POA: Diagnosis not present

## 2016-09-22 LAB — POCT INR: INR: 1.9

## 2016-10-07 ENCOUNTER — Ambulatory Visit (INDEPENDENT_AMBULATORY_CARE_PROVIDER_SITE_OTHER): Payer: Medicare Other | Admitting: Pharmacist

## 2016-10-07 DIAGNOSIS — I82409 Acute embolism and thrombosis of unspecified deep veins of unspecified lower extremity: Secondary | ICD-10-CM | POA: Diagnosis not present

## 2016-10-07 LAB — POCT INR: INR: 2.2

## 2016-10-21 ENCOUNTER — Ambulatory Visit (INDEPENDENT_AMBULATORY_CARE_PROVIDER_SITE_OTHER): Payer: Medicare Other | Admitting: *Deleted

## 2016-10-21 DIAGNOSIS — I82409 Acute embolism and thrombosis of unspecified deep veins of unspecified lower extremity: Secondary | ICD-10-CM

## 2016-10-21 LAB — POCT INR: INR: 2.5

## 2016-11-10 ENCOUNTER — Ambulatory Visit (INDEPENDENT_AMBULATORY_CARE_PROVIDER_SITE_OTHER): Payer: Medicare Other | Admitting: Pharmacist

## 2016-11-10 DIAGNOSIS — I82409 Acute embolism and thrombosis of unspecified deep veins of unspecified lower extremity: Secondary | ICD-10-CM

## 2016-11-10 LAB — POCT INR: INR: 2.7

## 2016-12-08 ENCOUNTER — Ambulatory Visit (INDEPENDENT_AMBULATORY_CARE_PROVIDER_SITE_OTHER): Payer: Medicare Other

## 2016-12-08 DIAGNOSIS — Z5181 Encounter for therapeutic drug level monitoring: Secondary | ICD-10-CM | POA: Diagnosis not present

## 2016-12-08 DIAGNOSIS — I82409 Acute embolism and thrombosis of unspecified deep veins of unspecified lower extremity: Secondary | ICD-10-CM | POA: Diagnosis not present

## 2016-12-08 LAB — POCT INR: INR: 3.8

## 2016-12-27 ENCOUNTER — Other Ambulatory Visit: Payer: Self-pay | Admitting: Cardiovascular Disease

## 2016-12-27 DIAGNOSIS — I82723 Chronic embolism and thrombosis of deep veins of upper extremity, bilateral: Secondary | ICD-10-CM

## 2016-12-29 ENCOUNTER — Ambulatory Visit (INDEPENDENT_AMBULATORY_CARE_PROVIDER_SITE_OTHER): Payer: Medicare Other | Admitting: *Deleted

## 2016-12-29 DIAGNOSIS — I82409 Acute embolism and thrombosis of unspecified deep veins of unspecified lower extremity: Secondary | ICD-10-CM | POA: Diagnosis not present

## 2016-12-29 DIAGNOSIS — Z5181 Encounter for therapeutic drug level monitoring: Secondary | ICD-10-CM

## 2016-12-29 LAB — POCT INR: INR: 2.6

## 2017-01-07 ENCOUNTER — Other Ambulatory Visit: Payer: Self-pay | Admitting: Cardiovascular Disease

## 2017-01-26 ENCOUNTER — Ambulatory Visit (INDEPENDENT_AMBULATORY_CARE_PROVIDER_SITE_OTHER): Payer: Medicare Other | Admitting: *Deleted

## 2017-01-26 DIAGNOSIS — I8290 Acute embolism and thrombosis of unspecified vein: Secondary | ICD-10-CM

## 2017-01-26 DIAGNOSIS — Z5181 Encounter for therapeutic drug level monitoring: Secondary | ICD-10-CM | POA: Diagnosis not present

## 2017-01-26 DIAGNOSIS — I82409 Acute embolism and thrombosis of unspecified deep veins of unspecified lower extremity: Secondary | ICD-10-CM

## 2017-01-26 LAB — POCT INR: INR: 2.3

## 2017-02-16 ENCOUNTER — Ambulatory Visit (INDEPENDENT_AMBULATORY_CARE_PROVIDER_SITE_OTHER): Payer: Medicare Other | Admitting: Pharmacist

## 2017-02-16 DIAGNOSIS — I82401 Acute embolism and thrombosis of unspecified deep veins of right lower extremity: Secondary | ICD-10-CM

## 2017-02-16 DIAGNOSIS — Z5181 Encounter for therapeutic drug level monitoring: Secondary | ICD-10-CM

## 2017-02-16 DIAGNOSIS — I82409 Acute embolism and thrombosis of unspecified deep veins of unspecified lower extremity: Secondary | ICD-10-CM

## 2017-02-16 LAB — POCT INR: INR: 1.6

## 2017-02-16 NOTE — Patient Instructions (Signed)
Take 2 tablets today and tomorrow, then start taking 1 tablet daily except 1.5 tablets on Sundays, Tuesdays, and Thursdays.  Recheck INR in 10 days. Call us with any medication changes or question. # [phone].

## 2017-02-28 ENCOUNTER — Ambulatory Visit (INDEPENDENT_AMBULATORY_CARE_PROVIDER_SITE_OTHER): Payer: Medicare Other | Admitting: Pharmacist

## 2017-02-28 DIAGNOSIS — Z5181 Encounter for therapeutic drug level monitoring: Secondary | ICD-10-CM | POA: Diagnosis not present

## 2017-02-28 DIAGNOSIS — I82409 Acute embolism and thrombosis of unspecified deep veins of unspecified lower extremity: Secondary | ICD-10-CM

## 2017-02-28 LAB — POCT INR: INR: 2.4

## 2017-02-28 NOTE — Patient Instructions (Signed)
Today take 1.5 tablets then continue taking 1 tablet daily except 1.5 tablets on Sundays, Tuesdays, and Thursdays.  Recheck INR in 2 weeks. Call us with any medication changes or question. # [phone].

## 2017-03-23 ENCOUNTER — Other Ambulatory Visit: Payer: Self-pay

## 2017-03-23 NOTE — Patient Outreach (Signed)
[facility]) Care Management  03/23/2017  Shirley [name] 01-21-1941 [id number]  Medication Adherence call to Shirley [name] patient is showing past due under [facility] Ins.on Lisinopril 10-12.5 mg left a message to the patient  To call back ,call [facility] pharmacy they said patient pick up medication on 03/08/17 for a 90 days supply patient wont be due until February 2019.  [facility] Management Direct Dial [phone]  Fax [phone] [email or web address]

## 2017-03-24 ENCOUNTER — Other Ambulatory Visit: Payer: Self-pay | Admitting: Cardiovascular Disease

## 2017-03-25 ENCOUNTER — Ambulatory Visit (INDEPENDENT_AMBULATORY_CARE_PROVIDER_SITE_OTHER): Payer: Medicare Other | Admitting: *Deleted

## 2017-03-25 DIAGNOSIS — I824Y9 Acute embolism and thrombosis of unspecified deep veins of unspecified proximal lower extremity: Secondary | ICD-10-CM

## 2017-03-25 DIAGNOSIS — Z5181 Encounter for therapeutic drug level monitoring: Secondary | ICD-10-CM | POA: Diagnosis not present

## 2017-03-25 DIAGNOSIS — I82409 Acute embolism and thrombosis of unspecified deep veins of unspecified lower extremity: Secondary | ICD-10-CM | POA: Diagnosis not present

## 2017-03-25 LAB — POCT INR: INR: 2.1

## 2017-03-25 NOTE — Patient Instructions (Signed)
Description   Today take 1.5 tablets then start taking the dose you should be taking which 1 tablet daily except 1.5 tablets on Sundays, Tuesdays, and Thursdays.  Recheck INR in 2 weeks. Call us with any medication changes or question. # [phone].

## 2017-04-06 ENCOUNTER — Other Ambulatory Visit: Payer: Self-pay | Admitting: *Deleted

## 2017-04-06 ENCOUNTER — Ambulatory Visit (INDEPENDENT_AMBULATORY_CARE_PROVIDER_SITE_OTHER): Payer: Medicare Other | Admitting: Pharmacist

## 2017-04-06 DIAGNOSIS — I82723 Chronic embolism and thrombosis of deep veins of upper extremity, bilateral: Secondary | ICD-10-CM

## 2017-04-06 DIAGNOSIS — I82409 Acute embolism and thrombosis of unspecified deep veins of unspecified lower extremity: Secondary | ICD-10-CM | POA: Diagnosis not present

## 2017-04-06 DIAGNOSIS — Z5181 Encounter for therapeutic drug level monitoring: Secondary | ICD-10-CM

## 2017-04-06 LAB — POCT INR: INR: 2.7

## 2017-04-06 MED ORDER — WARFARIN SODIUM 5 MG PO TABS: ORAL_TABLET | ORAL | 3 refills | Status: DC

## 2017-04-06 NOTE — Patient Instructions (Signed)
Description   Continue taking 1 tablet daily except 1.5 tablets on Sundays, Tuesdays, and Thursdays.  Recheck INR in 3 weeks. Call us with any medication changes or question. # [phone].

## 2017-04-27 ENCOUNTER — Ambulatory Visit (INDEPENDENT_AMBULATORY_CARE_PROVIDER_SITE_OTHER): Payer: Medicare Other | Admitting: *Deleted

## 2017-04-27 DIAGNOSIS — I82409 Acute embolism and thrombosis of unspecified deep veins of unspecified lower extremity: Secondary | ICD-10-CM

## 2017-04-27 DIAGNOSIS — Z5181 Encounter for therapeutic drug level monitoring: Secondary | ICD-10-CM | POA: Diagnosis not present

## 2017-04-27 DIAGNOSIS — I82499 Acute embolism and thrombosis of other specified deep vein of unspecified lower extremity: Secondary | ICD-10-CM | POA: Diagnosis not present

## 2017-04-27 LAB — POCT INR: INR: 1.8

## 2017-04-27 NOTE — Patient Instructions (Addendum)
Description   Today take 1.5 tablets and tomorrow take 2 tablets then continue taking 1 tablet daily except 1.5 tablets on Sundays, Tuesdays, and Thursdays.  Recheck INR in 2 weeks. Call us with any medication changes or question. # [phone].

## 2017-05-11 ENCOUNTER — Ambulatory Visit (INDEPENDENT_AMBULATORY_CARE_PROVIDER_SITE_OTHER): Payer: Medicare Other

## 2017-05-11 DIAGNOSIS — I82409 Acute embolism and thrombosis of unspecified deep veins of unspecified lower extremity: Secondary | ICD-10-CM | POA: Diagnosis not present

## 2017-05-11 DIAGNOSIS — Z5181 Encounter for therapeutic drug level monitoring: Secondary | ICD-10-CM

## 2017-05-11 LAB — POCT INR: INR: 2.7

## 2017-05-11 NOTE — Patient Instructions (Signed)
Description   Continue on same dosage 1 tablet daily except 1.5 tablets on Sundays, Tuesdays, and Thursdays.  Recheck INR in 3 weeks. Call us with any medication changes or question. # [phone].

## 2017-06-01 ENCOUNTER — Ambulatory Visit (INDEPENDENT_AMBULATORY_CARE_PROVIDER_SITE_OTHER): Payer: Medicare Other | Admitting: *Deleted

## 2017-06-01 DIAGNOSIS — I82409 Acute embolism and thrombosis of unspecified deep veins of unspecified lower extremity: Secondary | ICD-10-CM | POA: Diagnosis not present

## 2017-06-01 DIAGNOSIS — Z5181 Encounter for therapeutic drug level monitoring: Secondary | ICD-10-CM | POA: Diagnosis not present

## 2017-06-01 LAB — POCT INR: INR: 3.2

## 2017-06-01 NOTE — Patient Instructions (Signed)
Description   Today only take 1/2 tablet, tomorrow only take 1 tablet, then Continue on same dosage 1 tablet daily except 1.5 tablets on Sundays, Tuesdays, and Thursdays.  Recheck INR in 3 weeks. Call us with any medication changes or question. # [phone].

## 2017-06-13 NOTE — Progress Notes (Signed)
This encounter was created in error - please disregard.

## 2017-06-22 ENCOUNTER — Ambulatory Visit (INDEPENDENT_AMBULATORY_CARE_PROVIDER_SITE_OTHER): Payer: Medicare Other | Admitting: *Deleted

## 2017-06-22 DIAGNOSIS — I82409 Acute embolism and thrombosis of unspecified deep veins of unspecified lower extremity: Secondary | ICD-10-CM

## 2017-06-22 DIAGNOSIS — Z5181 Encounter for therapeutic drug level monitoring: Secondary | ICD-10-CM

## 2017-06-22 LAB — POCT INR: INR: 3.5

## 2017-06-22 NOTE — Patient Instructions (Signed)
Description   Skip today's dose, then start taking 1 tablet everyday except 1.5 tablets on Sundays and Thursdays.  Recheck INR in 2 weeks. Call us with any medication changes or question. # [phone].

## 2017-07-06 ENCOUNTER — Ambulatory Visit: Payer: Medicare Other | Admitting: *Deleted

## 2017-07-06 DIAGNOSIS — Z5181 Encounter for therapeutic drug level monitoring: Secondary | ICD-10-CM | POA: Diagnosis not present

## 2017-07-06 DIAGNOSIS — I82409 Acute embolism and thrombosis of unspecified deep veins of unspecified lower extremity: Secondary | ICD-10-CM

## 2017-07-06 LAB — POCT INR: INR: 4.4

## 2017-07-06 NOTE — Patient Instructions (Signed)
Description   Skip today's dose, then start taking 1 tablet everyday. Eat 3 serving each week of leafy green vegetable.  Recheck INR in 1 week. Call us with any medication changes or question. # [phone].

## 2017-07-11 ENCOUNTER — Other Ambulatory Visit: Payer: Self-pay | Admitting: Cardiovascular Disease

## 2017-07-11 DIAGNOSIS — I82723 Chronic embolism and thrombosis of deep veins of upper extremity, bilateral: Secondary | ICD-10-CM

## 2017-07-13 ENCOUNTER — Ambulatory Visit (INDEPENDENT_AMBULATORY_CARE_PROVIDER_SITE_OTHER): Payer: Medicare Other | Admitting: *Deleted

## 2017-07-13 DIAGNOSIS — I82409 Acute embolism and thrombosis of unspecified deep veins of unspecified lower extremity: Secondary | ICD-10-CM

## 2017-07-13 DIAGNOSIS — Z5181 Encounter for therapeutic drug level monitoring: Secondary | ICD-10-CM | POA: Diagnosis not present

## 2017-07-13 LAB — POCT INR: INR: 2.1

## 2017-07-13 NOTE — Patient Instructions (Signed)
Description   Start today April 10th taking  1 tablet everyday except 1 and 1/2 tablets on Wednesdays . Eat 3 serving each week of leafy green vegetable.  Recheck INR in 2 weeks. Call us with any medication changes or question. # [phone].

## 2017-07-27 ENCOUNTER — Ambulatory Visit (INDEPENDENT_AMBULATORY_CARE_PROVIDER_SITE_OTHER): Payer: Medicare Other | Admitting: *Deleted

## 2017-07-27 DIAGNOSIS — I82409 Acute embolism and thrombosis of unspecified deep veins of unspecified lower extremity: Secondary | ICD-10-CM

## 2017-07-27 DIAGNOSIS — Z5181 Encounter for therapeutic drug level monitoring: Secondary | ICD-10-CM | POA: Diagnosis not present

## 2017-07-27 LAB — POCT INR: INR: 3.7

## 2017-07-27 NOTE — Patient Instructions (Signed)
Description   Do not take any Coumadin today then continue taking 1 tablet everyday except 1 and 1/2 tablets on Wednesdays. Continue eating 3 serving each week of leafy green vegetable.  Recheck INR in 2 weeks. Call us with any medication changes or question. # [phone].

## 2017-08-11 ENCOUNTER — Ambulatory Visit (INDEPENDENT_AMBULATORY_CARE_PROVIDER_SITE_OTHER): Payer: Medicare Other | Admitting: *Deleted

## 2017-08-11 DIAGNOSIS — I82409 Acute embolism and thrombosis of unspecified deep veins of unspecified lower extremity: Secondary | ICD-10-CM

## 2017-08-11 DIAGNOSIS — Z5181 Encounter for therapeutic drug level monitoring: Secondary | ICD-10-CM

## 2017-08-11 LAB — POCT INR: INR: 3.1

## 2017-08-11 NOTE — Patient Instructions (Signed)
Description   Today take 1/2 tablet then start taking 1 tablet everyday. Continue eating 3-4 serving each week of leafy green vegetable.  Recheck INR in 2 weeks. Call us with any medication changes or question. # [phone].

## 2017-08-24 ENCOUNTER — Telehealth: Payer: Self-pay | Admitting: *Deleted

## 2017-08-24 ENCOUNTER — Ambulatory Visit: Payer: Medicare Other | Admitting: *Deleted

## 2017-08-24 DIAGNOSIS — I82409 Acute embolism and thrombosis of unspecified deep veins of unspecified lower extremity: Secondary | ICD-10-CM

## 2017-08-24 DIAGNOSIS — Z5181 Encounter for therapeutic drug level monitoring: Secondary | ICD-10-CM

## 2017-08-24 DIAGNOSIS — G459 Transient cerebral ischemic attack, unspecified: Secondary | ICD-10-CM

## 2017-08-24 LAB — POCT INR: INR: 1.9 — AB (ref 2.0–3.0)

## 2017-08-24 NOTE — Telephone Encounter (Signed)
-----   Message from Margretta Sidle, RN sent at 08/24/2017  1:41 PM EDT ----- Regarding: Madaline Savage Duty Dr Jerrye Noble pt in coumadin clinic today and she states has summons to appear for Jury Duty on June 26th and she states she does not feel she is able to do this as she is short of breath and unable to walk far distances. She is asking to be excused from this duty. Please advise her  Thank you Elbert Ewings RN

## 2017-08-24 NOTE — Patient Instructions (Signed)
Description   Today May 22nd take 1 and 1/2 tablets then continue taking 1 tablet everyday. Decrease intake of dark leafy greens to 2 servings a week.  Recheck INR in 2 weeks. Call us with any medication changes or question. # [phone].

## 2017-08-25 ENCOUNTER — Encounter: Payer: Self-pay | Admitting: Cardiovascular Disease

## 2017-08-25 NOTE — Telephone Encounter (Signed)
Left message to call back  Letter is printed and stamped.  Will place at front desk.

## 2017-08-25 NOTE — Telephone Encounter (Signed)
I wrote a letter for her. If my rubber stamp is available to sign it, we could have her come pick up the letter. I am not sure this will get her out of jury duty though. Thanks, chris

## 2017-08-26 NOTE — Telephone Encounter (Signed)
Left message to call back  

## 2017-09-07 ENCOUNTER — Ambulatory Visit (INDEPENDENT_AMBULATORY_CARE_PROVIDER_SITE_OTHER): Payer: Medicare Other | Admitting: *Deleted

## 2017-09-07 ENCOUNTER — Encounter: Payer: Self-pay | Admitting: Interventional Cardiology

## 2017-09-07 ENCOUNTER — Ambulatory Visit (INDEPENDENT_AMBULATORY_CARE_PROVIDER_SITE_OTHER): Payer: Medicare Other | Admitting: Interventional Cardiology

## 2017-09-07 VITALS — BP 142/78 | HR 61 | Ht 63.0 in | Wt 278.0 lb

## 2017-09-07 DIAGNOSIS — Z5181 Encounter for therapeutic drug level monitoring: Secondary | ICD-10-CM

## 2017-09-07 DIAGNOSIS — R079 Chest pain, unspecified: Secondary | ICD-10-CM | POA: Diagnosis not present

## 2017-09-07 DIAGNOSIS — E7800 Pure hypercholesterolemia, unspecified: Secondary | ICD-10-CM

## 2017-09-07 DIAGNOSIS — I2510 Atherosclerotic heart disease of native coronary artery without angina pectoris: Secondary | ICD-10-CM | POA: Diagnosis not present

## 2017-09-07 DIAGNOSIS — Z7901 Long term (current) use of anticoagulants: Secondary | ICD-10-CM | POA: Diagnosis not present

## 2017-09-07 DIAGNOSIS — I82409 Acute embolism and thrombosis of unspecified deep veins of unspecified lower extremity: Secondary | ICD-10-CM

## 2017-09-07 DIAGNOSIS — I10 Essential (primary) hypertension: Secondary | ICD-10-CM

## 2017-09-07 DIAGNOSIS — I824Y9 Acute embolism and thrombosis of unspecified deep veins of unspecified proximal lower extremity: Secondary | ICD-10-CM

## 2017-09-07 DIAGNOSIS — G459 Transient cerebral ischemic attack, unspecified: Secondary | ICD-10-CM | POA: Diagnosis not present

## 2017-09-07 LAB — POCT INR: INR: 3.4 — AB (ref 2.0–3.0)

## 2017-09-07 MED ORDER — NITROGLYCERIN 0.4 MG SL SUBL: 0.4000 mg | SUBLINGUAL_TABLET | SUBLINGUAL | 3 refills | Status: DC | PRN

## 2017-09-07 NOTE — Patient Instructions (Signed)
Description   Today June 5th take 1/2 tablet then  continue taking 1 tablet everyday. Do good serving of dark leafy green today then continue to do  2 servings a week.  Recheck INR in 2 weeks. Call us with any medication changes or questions. # [phone]. Main Coumadin clinic [phone]

## 2017-09-07 NOTE — Telephone Encounter (Signed)
Pt picked up letter when here for coumadin clinic appointment today.

## 2017-09-07 NOTE — Patient Instructions (Addendum)
Medication Instructions:  Your physician has recommended you make the following change in your medication:  1- Take 1 Nitroglycerin (NTG), under your tongue, while sitting. If no relief of pain may repeat NTG, one tab every 5 minutes up to 3 tablets total over 15 minutes. If no relief CALL 911. If you have dizziness/lightheadness while taking NTG, stop taking and call 911.   Labwork: NONE  Testing/Procedures: Your physician has requested that you have a lexiscan myoview, two day test. For further information please visit [email or web address]. Please follow instruction sheet, as given.  Follow-Up: Your physician wants you to follow-up as scheduled with Dr. [name]   If you need a refill on your cardiac medications before your next appointment, please call your pharmacy.

## 2017-09-07 NOTE — Progress Notes (Signed)
Cardiology Office Note    Date:  09/07/2017   ID:  Shirley [name], DOB 11-24-1940, MRN [id number]  PCP:  Merrilee Seashore, MD  Cardiologist: Shirley Grooms, MD   No chief complaint on file.   History of Present Illness:  Shirley [name] is a 77 y.o. female with history of CAD with inferior STEMI October 2015, HTN, DVT on lifelong anticoagulation, and sleep apnea here today for cardiac follow up in Coumadin clinic.  While in Coumadin clinic she mentioned episodes of chest pain and was referred to the DOD for further assessment.  In speaking with the patient, she note episodes of back discomfort and chest discomfort that lasts seconds to a minute.  She has a difficult time characterizing the quality of the pain.  These have awakened her from sleep.  No radiation to the arm or neck.  No associated dyspnea and dissimilar to discomfort associated with her myocardial infarction.  Though obese, she walks a mile every day and does not have discomfort.  She has not tried nitroglycerin for the episodes.  The episodes have been brief lasting 1 to 2 minutes before resolving.  She is pain-free today.   Past Medical History:  Diagnosis Date  . Adenomatous polyps   . Coronary artery disease, non-occlusive 11/2003; 01/2014   LHC (11/2003) - LAD diffuse 30% stenosis of mid-vessel. RCA with 30% stenosis of proximal vessel - by Dr. Dannielle Burn. // 2D Echo (04/2006) -  LV EF 65%.  Mild LVH. No  wall motion abnormalities.  Mild diastolic dysfunction. 2015 with STEMI- stent palced  . DVT (deep venous thrombosis) ([place]) 2003   lower extremity DVT with questionable recurrence in 2005. Treated with 1.5 year course of coumadin.  . Endometrial thickening on ultra sound 07/2009   path showing Fragments of benign endocervical and squamous mucosa. No dysplasia or malignancy // Followed by Dr. Gus [name]  . GERD (gastroesophageal reflux disease)   . Hemorrhoid   . History of TIAs    2004 - no documentation  available. 09/2010 -  was not on aspirin or statin therapy prior to event.  [name] Hx of myocardial infarction less than 8 weeks 01/24/14  . Hyperlipidemia LDL goal <70   . Hypertension   . Prediabetes DX: 09/2010   HgA1c 6.1  . Stroke ([place])   . Syncope 12/18/2013    Past Surgical History:  Procedure Laterality Date  . COLONOSCOPY W/ POLYPECTOMY  11/01/2007   8 mm rectal adenoma, hemorrhoids  . CORONARY ANGIOPLASTY WITH STENT PLACEMENT  01/24/14   Inf. STEMI, BMS to RCA  . KNEE SURGERY  1996   Left knee arthroscopy.  [name] LEFT HEART CATHETERIZATION WITH CORONARY ANGIOGRAM N/A 01/24/2014   Procedure: LEFT HEART CATHETERIZATION WITH CORONARY ANGIOGRAM;  Surgeon: Shirley Blanks, MD;  Location: [facility] CATH LAB;  Service: Cardiovascular;  Laterality: N/A;  . ROTATOR CUFF REPAIR     right.  . TUBAL LIGATION      Current Medications: Outpatient Medications Prior to Visit  Medication Sig Dispense Refill  . acetaminophen (TYLENOL) 500 MG tablet Take 500 mg by mouth every 6 (six) hours as needed for mild pain.     [name] aspirin EC 81 MG tablet Take 1 tablet (81 mg total) by mouth daily. 90 tablet 3  . atorvastatin (LIPITOR) 80 MG tablet TAKE 1 TABLET BY MOUTH DAILY AT 6PM 90 tablet 1  . lisinopril-hydrochlorothiazide (PRINZIDE,ZESTORETIC) 10-12.5 MG tablet Take 1 tablet by mouth daily. 30 tablet 10  .  metoprolol succinate (TOPROL-XL) 25 MG 24 hr tablet TAKE 1 TABLET BY MOUTH EVERY DAY WITH OR IMMEDIATELY FOLLOWING A MEAL 90 tablet 1  . Multiple Vitamin (MULTIVITAMIN WITH MINERALS) TABS Take 1 tablet by mouth every morning.    . warfarin (COUMADIN) 5 MG tablet TAKE AS DIRECTED BY COUMADIN CLINIC. 45 tablet 2  . nitroGLYCERIN (NITROSTAT) 0.4 MG SL tablet Place 1 tablet (0.4 mg total) under the tongue every 5 (five) minutes x 3 doses as needed for chest pain. (Patient not taking: Reported on 09/07/2017) 25 tablet 6   No facility-administered medications prior to visit.      Allergies:   Patient has  no known allergies.   Social History   Socioeconomic History  . Marital status: Married    Spouse name: Not on file  . Number of children: 6  . Years of education: College  . Highest education level: Not on file  Occupational History  . Occupation: Control and instrumentation engineer  . Occupation: [employer]    Employer: [employer]  Social Needs  . Financial resource strain: Not on file  . Food insecurity:    Worry: Not on file    Inability: Not on file  . Transportation needs:    Medical: Not on file    Non-medical: Not on file  Tobacco Use  . Smoking status: Never Smoker  . Smokeless tobacco: Never Used  Substance and Sexual Activity  . Alcohol use: No  . Drug use: No  . Sexual activity: Not on file  Lifestyle  . Physical activity:    Days per week: Not on file    Minutes per session: Not on file  . Stress: Not on file  Relationships  . Social connections:    Talks on phone: Not on file    Gets together: Not on file    Attends religious service: Not on file    Active member of club or organization: Not on file    Attends meetings of clubs or organizations: Not on file    Relationship status: Not on file  Other Topics Concern  . Not on file  Social History Narrative   Full Code status.   Insurance: BCBS     Family History:  The patient's family history includes Alzheimer's disease in her maternal grandmother; Angina in her father; Dementia in her mother; Diabetes in her mother; Heart attack in her father; Heart disease in her brother and mother; Hyperlipidemia in her mother; Liver cancer in her sister; Thyroid cancer in her sister.   ROS:   Please see the history of present illness.    No bleeding on Coumadin.  No palpitations with chest discomfort.  No episodes of syncope, blood in urine or stool, orthopnea, or PND. All other systems reviewed and are negative.   PHYSICAL EXAM:   VS:  BP (!) 142/78 (BP Location: Right Arm, Patient Position:  Sitting, Cuff Size: Large)   Pulse 61   SpO2 99%    GEN: Morbidly obese and in no distress HEENT: normal  Neck: no JVD, carotid bruits, or masses Cardiac: RRR; no murmurs, rubs, or gallops,no edema  Respiratory:  clear to auscultation bilaterally, normal work of breathing GI: soft, nontender, nondistended, + BS MS: no deformity or atrophy  Skin: warm and dry, no rash Neuro:  Alert and Oriented x 3, Strength and sensation are intact Psych: euthymic mood, full affect  Wt Readings from Last 3 Encounters:  09/08/16 276 lb (125.2 kg)  02/05/16 275  lb 6.4 oz (124.9 kg)  03/07/15 259 lb 6.4 oz (117.7 kg)      Studies/Labs Reviewed:   EKG:  EKG sinus rhythm, left anterior hemiblock, incomplete right bundle branch block, first degree AV block, and when compared to the previous prior tracing performed February 05, 2016, no changes occurred.  Recent Labs: No results found for requested labs within last 8760 hours.   Lipid Panel    Component Value Date/Time   CHOL 107 03/19/2014 0745   TRIG 110.0 03/19/2014 0745   HDL 24.40 (L) 03/19/2014 0745   CHOLHDL 4 03/19/2014 0745   VLDL 22.0 03/19/2014 0745   LDLCALC 61 03/19/2014 0745    Additional studies/ records that were reviewed today include:  None    ASSESSMENT:    1. Chest pain, unspecified type   2. Coronary artery disease involving native coronary artery of native heart without angina pectoris   3. Pure hypercholesterolemia   4. Essential hypertension   5. Deep vein thrombosis (DVT) of proximal lower extremity, unspecified chronicity, unspecified laterality ([place])   6. Chronic anticoagulation      PLAN:  In order of problems listed above:  1. Chest pain has atypical features but given implantation of a bare-metal stent, other risk factors including hypertension, hyperlipidemia, and prediabetes ischemia cannot be excluded.  She is instructed on use of nitroglycerin.  Prolonged episodes unrelieved by nitroglycerin should  prompt a call to the office or an emergency room visit depending upon the situation.  A 2-day Lexiscan myocardial perfusion study will be performed to exclude ischemia.  She should keep appointment already set with Dr. [name] within the next 2 weeks. 2. Prior bare-metal stent in the right coronary during acute infarction 4 years ago.  Left coronary system was clean at the time. 3. Last LDL was in our records was 2015.  She is on high intensity atorvastatin but I am uncertain about who manages this. 4. Target blood pressure 130/80 mmHg.  Slightly elevated today.  Discussed salt reduction in diet 5. Continue chronic Coumadin therapy.  Atypical chest pain in this 77 year old female with prior history of acute myocardial infarction.  She is instructed on use of nitroglycerin.  She is instructed to return for follow-up to go to the emergency room with prolonged chest discomfort.  A 2-day Carlton Adam will be performed to rule out high risk subset.  Keep follow-up appointment with Dr. [name] within the next 2 weeks  Prolonged work in office visit with greater than 50% of the time spent in coordination of care, education, and counseling.  Medication Adjustments/Labs and Tests Ordered: Current medicines are reviewed at length with the patient today.  Concerns regarding medicines are outlined above.  Medication changes, Labs and Tests ordered today are listed in the Patient Instructions below. Patient Instructions  Medication Instructions:  Your physician has recommended you make the following change in your medication:  1- Take 1 Nitroglycerin (NTG), under your tongue, while sitting. If no relief of pain may repeat NTG, one tab every 5 minutes up to 3 tablets total over 15 minutes. If no relief CALL 911. If you have dizziness/lightheadness while taking NTG, stop taking and call 911.   Labwork: NONE  Testing/Procedures: Your physician has requested that you have a lexiscan myoview, two day test.  For further information please visit [email or web address]. Please follow instruction sheet, as given.  Follow-Up: Your physician wants you to follow-up as scheduled with Dr. [name]   If you need a refill on your cardiac medications  before your next appointment, please call your pharmacy.       Signed, Shirley Grooms, MD  09/07/2017 6:56 PM    [facility] [address], [place], [place]  [address] Phone: [phone]; Fax: [phone]

## 2017-09-21 ENCOUNTER — Ambulatory Visit: Payer: Medicare Other | Admitting: Pharmacist

## 2017-09-21 DIAGNOSIS — I82409 Acute embolism and thrombosis of unspecified deep veins of unspecified lower extremity: Secondary | ICD-10-CM

## 2017-09-21 DIAGNOSIS — Z5181 Encounter for therapeutic drug level monitoring: Secondary | ICD-10-CM | POA: Diagnosis not present

## 2017-09-21 LAB — POCT INR: INR: 2.4 (ref 2.0–3.0)

## 2017-09-21 NOTE — Patient Instructions (Signed)
Description   Take 1.5 tablets today, then continue taking 1 tablet everyday. Recheck INR in 3 weeks. Call us with any medication changes or questions. # [phone]. Main Coumadin clinic [phone]

## 2017-09-22 ENCOUNTER — Telehealth (HOSPITAL_COMMUNITY): Payer: Self-pay | Admitting: *Deleted

## 2017-09-22 NOTE — Telephone Encounter (Signed)
Patient's daughter, per dpr, given detailed instructions per Myocardial Perfusion Study Information Sheet for the test on 09/26/17. Patient notified to arrive 15 minutes early and that it is imperative to arrive on time for appointment to keep from having the test rescheduled.  If you need to cancel or reschedule your appointment, please call the office within 24 hours of your appointment. . Patient verbalized understanding. Kirstie Peri

## 2017-09-26 ENCOUNTER — Ambulatory Visit (HOSPITAL_COMMUNITY): Payer: Medicare Other | Attending: Cardiology

## 2017-09-26 DIAGNOSIS — R079 Chest pain, unspecified: Secondary | ICD-10-CM | POA: Diagnosis not present

## 2017-09-26 MED ORDER — TECHNETIUM TC 99M TETROFOSMIN IV KIT
32.5000 | PACK | Freq: Once | INTRAVENOUS | Status: AC | PRN
  Administered 2017-09-26: 32.5 via INTRAVENOUS
  Filled 2017-09-26: qty 33

## 2017-09-26 MED ORDER — REGADENOSON 0.4 MG/5ML IV SOLN
0.4000 mg | Freq: Once | INTRAVENOUS | Status: AC
  Administered 2017-09-26: 0.4 mg via INTRAVENOUS

## 2017-09-27 ENCOUNTER — Ambulatory Visit (HOSPITAL_COMMUNITY): Payer: Medicare Other | Attending: Cardiology

## 2017-09-27 LAB — MYOCARDIAL PERFUSION IMAGING
CHL CUP NUCLEAR SSS: 9
CHL CUP RESTING HR STRESS: 57 {beats}/min
LHR: 0.45
LV dias vol: 101 mL (ref 46–106)
LV sys vol: 24 mL
NUC STRESS TID: 0.73
Peak HR: 75 {beats}/min
SDS: 8
SRS: 1

## 2017-09-27 MED ORDER — TECHNETIUM TC 99M TETROFOSMIN IV KIT
31.5000 | PACK | Freq: Once | INTRAVENOUS | Status: AC | PRN
  Administered 2017-09-27: 31.5 via INTRAVENOUS
  Filled 2017-09-27: qty 32

## 2017-09-29 ENCOUNTER — Other Ambulatory Visit: Payer: Self-pay | Admitting: Cardiovascular Disease

## 2017-09-30 ENCOUNTER — Telehealth: Payer: Self-pay | Admitting: Cardiovascular Disease

## 2017-09-30 NOTE — Telephone Encounter (Signed)
Spoke with patients daughter, Caren Griffins and made her aware that the rx was sent in yesterday and that I have verified with [facility] that they did receive it and it is ready to be picked up. She verbalized her understanding and appreciation.

## 2017-09-30 NOTE — Telephone Encounter (Signed)
NewMessage    *STAT* If patient is at the pharmacy, call can be transferred to refill team.   1. Which medications need to be refilled? (please list name of each medication and dose if known) lisinopril-hydrochlorothiazide (PRINZIDE,ZESTORETIC) 10-12.5 MG tablet  2. Which pharmacy/location (including street and city if local pharmacy) is medication to be sent to? [place], [place] - [address]  3. Do they need a 30 day or 90 day supply? [place]

## 2017-10-08 ENCOUNTER — Other Ambulatory Visit: Payer: Self-pay | Admitting: Cardiovascular Disease

## 2017-10-08 DIAGNOSIS — I82723 Chronic embolism and thrombosis of deep veins of upper extremity, bilateral: Secondary | ICD-10-CM

## 2017-10-10 ENCOUNTER — Ambulatory Visit: Payer: Medicare Other | Admitting: Cardiovascular Disease

## 2017-10-10 ENCOUNTER — Ambulatory Visit (INDEPENDENT_AMBULATORY_CARE_PROVIDER_SITE_OTHER): Payer: Medicare Other | Admitting: *Deleted

## 2017-10-10 ENCOUNTER — Encounter: Payer: Self-pay | Admitting: Cardiovascular Disease

## 2017-10-10 VITALS — BP 158/88 | HR 65 | Ht 63.0 in | Wt 287.0 lb

## 2017-10-10 DIAGNOSIS — I2510 Atherosclerotic heart disease of native coronary artery without angina pectoris: Secondary | ICD-10-CM | POA: Diagnosis not present

## 2017-10-10 DIAGNOSIS — I824Y9 Acute embolism and thrombosis of unspecified deep veins of unspecified proximal lower extremity: Secondary | ICD-10-CM

## 2017-10-10 DIAGNOSIS — I10 Essential (primary) hypertension: Secondary | ICD-10-CM | POA: Diagnosis not present

## 2017-10-10 DIAGNOSIS — E7800 Pure hypercholesterolemia, unspecified: Secondary | ICD-10-CM | POA: Diagnosis not present

## 2017-10-10 DIAGNOSIS — I82409 Acute embolism and thrombosis of unspecified deep veins of unspecified lower extremity: Secondary | ICD-10-CM | POA: Diagnosis not present

## 2017-10-10 DIAGNOSIS — Z5181 Encounter for therapeutic drug level monitoring: Secondary | ICD-10-CM

## 2017-10-10 LAB — POCT INR: INR: 3.6 — AB (ref 2.0–3.0)

## 2017-10-10 MED ORDER — LISINOPRIL-HYDROCHLOROTHIAZIDE 20-25 MG PO TABS: 1.0000 | ORAL_TABLET | Freq: Every day | ORAL | 3 refills | Status: DC

## 2017-10-10 NOTE — Patient Instructions (Addendum)
Medication Instructions:  Your physician has recommended you make the following change in your medication:  Increase lisinopril /HCTZ to 20/25 mg by mouth daily   Labwork: Your physician recommends that you return for lab work in: 7-10 days. Scheduled for July 16,2019. The lab opens at 7:30 AM   Testing/Procedures: none  Follow-Up: Your physician recommends that you schedule a follow-up appointment in 6 months. Please call our office in about 2 months to schedule this appointment   Any Other Special Instructions Will Be Listed Below (If Applicable).     If you need a refill on your cardiac medications before your next appointment, please call your pharmacy.

## 2017-10-10 NOTE — Progress Notes (Signed)
Chief Complaint  Patient presents with  . Follow-up    CAD   History of Present Illness: 77 yo female with history of CAD with inferior STEMI October 2015, HTN, DVT and sleep apnea here today for cardiac follow up. She was admitted to [facility] October 2015 with an inferior STEMI. A bare metal stent was placed in the sub-totally occluded proximal RCA. The LAD and Circumflex had no disease. She has been on coumadin therapy for recurrent DVT. She was seen in our office June 2019 by Dr. Tamala Julian as office DOD with c/o chest pains. The pains lasted for a few seconds. Also back pain. A nuclear stress test was arranged on 09/27/17 and was low risk with normal LV function and no ischemia. There was a possible apical lateral scar.   She is here today for follow up. The patient denies any chest pain, dyspnea, palpitations, lower extremity edema, orthopnea, PND, dizziness, near syncope or syncope.   Primary Care Physician: Merrilee Seashore, MD  Past Medical History:  Diagnosis Date  . Adenomatous polyps   . CAD in native artery, stent to RCA BMS with MI 2015 11/04/2003   LHC (11/2003) - LAD diffuse 30% stenosis of mid-vessel. RCA with 30% stenosis of proximal vessel - by Dr. Dannielle Burn. // 2D Echo (04/2006) -  LV EF 65%.  Mild LVH. No  wall motion abnormalities.  Mild diastolic dysfunction.   01/24/14 STEMI RCA -- procedure revealed 99% proximal RCA lesion. This was treated successfully with PTCA/bare metal stent x 1. Her systolic function is preserved.   . Chest pain 07/03/2014  . Chest wall pain 02/01/2014  . Coronary artery disease, non-occlusive 11/2003; 01/2014   LHC (11/2003) - LAD diffuse 30% stenosis of mid-vessel. RCA with 30% stenosis of proximal vessel - by Dr. Dannielle Burn. // 2D Echo (04/2006) -  LV EF 65%.  Mild LVH. No  wall motion abnormalities.  Mild diastolic dysfunction. 2015 with STEMI- stent palced  . Dizziness 02/01/2014  . DVT (deep venous thrombosis) ([place]) 2003   lower extremity DVT with  questionable recurrence in 2005. Treated with 1.5 year course of coumadin.  [name] DVT (deep venous thrombosis), unspecified laterality 12/24/2013  . Encounter for therapeutic drug monitoring 12/08/2016  . Endometrial thickening on ultra sound 07/2009   path showing Fragments of benign endocervical and squamous mucosa. No dysplasia or malignancy // Followed by Dr. [name]  . GERD (gastroesophageal reflux disease)   . Hemorrhoid   . History of TIAs    2004 - no documentation available. 09/2010 -  was not on aspirin or statin therapy prior to event.  [name] Hx of myocardial infarction less than 8 weeks 01/24/14  . Hyperglycemia 09/23/2010  . Hyperlipidemia 09/23/2010  . Hyperlipidemia LDL goal <70   . Hypertension   . Insomnia 11/02/2010  . Memory difficulties 11/02/2010  . Morbid obesity ([place]) 09/16/2012  . Need for diphtheria-tetanus-pertussis (Tdap) vaccine, adult/adolescent 11/02/2010  . Obstructive sleep apnea 09/23/2010  . Prediabetes DX: 09/2010   HgA1c 6.1  . ST elevation myocardial infarction (STEMI) of inferior wall ([place]) 01/24/2014  . Stroke ([place])   . Syncope 12/18/2013  . TIA (transient ischemic attack) 09/23/2010   09/13/2010: CT, MRI/MRA no acute process. Carotid doppler: no stenosis. 2D-Echo Normal LV function with an EF 55-60%.   [name] UTI (urinary tract infection) 01/27/2014    Past Surgical History:  Procedure Laterality Date  . COLONOSCOPY W/ POLYPECTOMY  11/01/2007   8 mm rectal adenoma, hemorrhoids  .  CORONARY ANGIOPLASTY WITH STENT PLACEMENT  01/24/14   Inf. STEMI, BMS to RCA  . KNEE SURGERY  1996   Left knee arthroscopy.  [name] LEFT HEART CATHETERIZATION WITH CORONARY ANGIOGRAM N/A 01/24/2014   Procedure: LEFT HEART CATHETERIZATION WITH CORONARY ANGIOGRAM;  Surgeon: Burnell Blanks, MD;  Location: [facility] CATH LAB;  Service: Cardiovascular;  Laterality: N/A;  . ROTATOR CUFF REPAIR     right.  . TUBAL LIGATION      Current Outpatient Medications  Medication Sig Dispense Refill    . acetaminophen (TYLENOL) 500 MG tablet Take 500 mg by mouth every 6 (six) hours as needed for mild pain.     [name] aspirin EC 81 MG tablet Take 1 tablet (81 mg total) by mouth daily. 90 tablet 3  . atorvastatin (LIPITOR) 80 MG tablet TAKE 1 TABLET BY MOUTH DAILY AT 6PM 90 tablet 1  . metoprolol succinate (TOPROL-XL) 25 MG 24 hr tablet TAKE 1 TABLET BY MOUTH EVERY DAY WITH OR IMMEDIATELY FOLLOWING A MEAL 90 tablet 1  . Multiple Vitamin (MULTIVITAMIN WITH MINERALS) TABS Take 1 tablet by mouth every morning.    . nitroGLYCERIN (NITROSTAT) 0.4 MG SL tablet Place 1 tablet (0.4 mg total) under the tongue every 5 (five) minutes x 3 doses as needed for chest pain. 25 tablet 3  . warfarin (COUMADIN) 5 MG tablet TAKE AS DIRECTED BY COUMADIN CLINIC. 135 tablet 0  . lisinopril-hydrochlorothiazide (ZESTORETIC) 20-25 MG tablet Take 1 tablet by mouth daily. 90 tablet 3   No current facility-administered medications for this visit.     No Known Allergies  Social History   Socioeconomic History  . Marital status: Married    Spouse name: Not on file  . Number of children: 6  . Years of education: College  . Highest education level: Not on file  Occupational History  . Occupation: Control and instrumentation engineer  . Occupation: [place]    Employer: [employer]  Social Needs  . Financial resource strain: Not on file  . Food insecurity:    Worry: Not on file    Inability: Not on file  . Transportation needs:    Medical: Not on file    Non-medical: Not on file  Tobacco Use  . Smoking status: Never Smoker  . Smokeless tobacco: Never Used  Substance and Sexual Activity  . Alcohol use: No  . Drug use: No  . Sexual activity: Not on file  Lifestyle  . Physical activity:    Days per week: Not on file    Minutes per session: Not on file  . Stress: Not on file  Relationships  . Social connections:    Talks on phone: Not on file    Gets together: Not on file    Attends religious  service: Not on file    Active member of club or organization: Not on file    Attends meetings of clubs or organizations: Not on file    Relationship status: Not on file  . Intimate partner violence:    Fear of current or ex partner: Not on file    Emotionally abused: Not on file    Physically abused: Not on file    Forced sexual activity: Not on file  Other Topics Concern  . Not on file  Social History Narrative   Full Code status.   Insurance: BCBS    Family History  Problem Relation Age of Onset  . Diabetes Mother   . Hyperlipidemia Mother   .  Heart disease Mother   . Dementia Mother   . Angina Father   . Heart attack Father   . Thyroid cancer Sister   . Liver cancer Sister   . Heart disease Brother        x 2 in 73s yo  . Alzheimer's disease Maternal Grandmother   . Stroke Neg Hx     Review of Systems:  As stated in the HPI and otherwise negative.   BP (!) 158/88   Pulse 65   Ht 5\' 3"  (1.6 m)   Wt 287 lb (130.2 kg)   SpO2 97%   BMI 50.84 kg/m   Physical Examination:  General: Well developed, well nourished, NAD  HEENT: OP clear, mucus membranes moist  SKIN: warm, dry. No rashes. Neuro: No focal deficits  Musculoskeletal: Muscle strength 5/5 all ext  Psychiatric: Mood and affect normal  Neck: No JVD, no carotid bruits, no thyromegaly, no lymphadenopathy.  Lungs:Clear bilaterally, no wheezes, rhonci, crackles Cardiovascular: Regular rate and rhythm. No murmurs, gallops or rubs. Abdomen:Soft. Bowel sounds present. Non-tender.  Extremities: No lower extremity edema. Pulses are 2 + in the bilateral DP/PT.  Echo 01/24/14:  Left ventricle: The cavity size was normal. There was mild concentric hypertrophy. Systolic function was normal. The estimated ejection fraction was in the range of 55% to 60%. Wall motion was normal; there were no regional wall motion abnormalities. - Aortic valve: There was trivial regurgitation. - Ascending aorta: The  ascending aorta was mildly dilated. - Mitral valve: Calcified annulus.  Cardiac cath 01/24/14: Left main: No obstructive disease.  Left Anterior Descending Artery: Large caliber vessel that courses to the apex. Moderate caliber diagonal branch. No obstructive disease.  Circumflex Artery: Large caliber vessel with large obtuse marginal branch. No obstructive disease.  Right Coronary Artery: Moderate caliber co-dominant vessel with sub-totally occluded proximal vessel.  Left Ventricular Angiogram: LVEF=50%  EKG:  EKG is not ordered today. The ekg ordered today demonstrates   Recent Labs: No results found for requested labs within last 8760 hours.   Lipid Panel Primary care 05/27/17:  TC 102 HDL 39 LDL 45 TG: 87   Wt Readings from Last 3 Encounters:  10/10/17 287 lb (130.2 kg)  09/26/17 278 lb (126.1 kg)  09/07/17 278 lb (126.1 kg)     Other studies Reviewed: Additional studies/ records that were reviewed today include: . Review of the above records demonstrates:    Assessment and Plan:   1. CAD without angina: Rare chest pains. Low risk stress test in June 2019. Continue ASA, beta blocker and statin. .  2. HTN: BP is elevated. Will increase Lisinopril/HCTZ to 20/25 mg. She will buy a BP cuff. BMET one week.    3. DVT: She is on lifelong coumadin therapy. This is managed in primary care. INR is followed in our coumadin clinic.   4. HLD: Lipids followed in primary care. LDL 47 February 2018 in primary care. Continue statin.   Current medicines are reviewed at length with the patient today.  The patient does not have concerns regarding medicines.  The following changes have been made:  no change  Labs/ tests ordered today include:   Orders Placed This Encounter  Procedures  . Basic Metabolic Panel (BMET)    Disposition:   FU with me in 6 months  Signed, Lauree Chandler, MD 10/10/2017 3:30 PM    [address],  [place], [place]  [address] Phone: [phone]; Fax: [phone]

## 2017-10-10 NOTE — Patient Instructions (Signed)
Description   Do not take coumadin today July 8th then  continue taking 1 tablet everyday. Recheck INR in 2 weeks. Call us with any medication changes or questions. # [phone]. Main Coumadin clinic [phone]

## 2017-10-12 ENCOUNTER — Other Ambulatory Visit: Payer: Self-pay | Admitting: Cardiovascular Disease

## 2017-10-18 ENCOUNTER — Other Ambulatory Visit: Payer: Medicare Other

## 2017-10-18 DIAGNOSIS — I10 Essential (primary) hypertension: Secondary | ICD-10-CM

## 2017-10-18 DIAGNOSIS — I2510 Atherosclerotic heart disease of native coronary artery without angina pectoris: Secondary | ICD-10-CM

## 2017-10-18 LAB — BASIC METABOLIC PANEL
BUN/Creatinine Ratio: 16 (ref 12–28)
BUN: 11 mg/dL (ref 8–27)
CO2: 23 mmol/L (ref 20–29)
Calcium: 8.9 mg/dL (ref 8.7–10.3)
Chloride: 95 mmol/L — ABNORMAL LOW (ref 96–106)
Creatinine, Ser: 0.68 mg/dL (ref 0.57–1.00)
GFR, EST AFRICAN AMERICAN: 98 mL/min/{1.73_m2} (ref >59)
GFR, EST NON AFRICAN AMERICAN: 85 mL/min/{1.73_m2} (ref >59)
Glucose: 131 mg/dL — ABNORMAL HIGH (ref 65–99)
Potassium: 4.2 mmol/L (ref 3.5–5.2)
SODIUM: 132 mmol/L — AB (ref 134–144)

## 2017-10-19 ENCOUNTER — Telehealth: Payer: Self-pay

## 2017-10-19 NOTE — Telephone Encounter (Signed)
-----   Message from Burnell Blanks, MD sent at 10/19/2017  8:02 AM EDT ----- BMET is ok after increasing her Lisinopril/HCTZ. Renal function is ok. Can we let her know? Thanks, chris

## 2017-10-19 NOTE — Telephone Encounter (Signed)
Notes recorded by Frederik Schmidt, RN on 10/19/2017 at 11:21 AM EDT Tried calling patient, but phone will not ring. ------

## 2017-10-24 ENCOUNTER — Ambulatory Visit: Payer: Medicare Other | Admitting: Pharmacist

## 2017-10-24 DIAGNOSIS — Z5181 Encounter for therapeutic drug level monitoring: Secondary | ICD-10-CM

## 2017-10-24 DIAGNOSIS — I82409 Acute embolism and thrombosis of unspecified deep veins of unspecified lower extremity: Secondary | ICD-10-CM | POA: Diagnosis not present

## 2017-10-24 LAB — POCT INR: INR: 2.5 (ref 2.0–3.0)

## 2017-10-24 NOTE — Patient Instructions (Signed)
Description   Continue taking 1 tablet everyday. Recheck INR in 3 weeks. Call us with any medication changes or questions. # [phone]. Main Coumadin clinic [phone]

## 2017-10-25 ENCOUNTER — Telehealth: Payer: Self-pay | Admitting: Cardiovascular Disease

## 2017-10-25 NOTE — Telephone Encounter (Signed)
Patient's daughter [facility]) informed of lab results.  Pt will stay on lisinopril-hctz.  Will route to Dr. Ashby Dawes per request.

## 2017-10-25 NOTE — Telephone Encounter (Signed)
Pt's daughter aware of results/recommendations.  Will route to PCP per request.

## 2017-10-25 NOTE — Telephone Encounter (Signed)
New Message   Pt's daughter is calling to get the results to her mother's labs. Please call

## 2017-11-16 ENCOUNTER — Ambulatory Visit: Payer: Medicare Other | Admitting: *Deleted

## 2017-11-16 DIAGNOSIS — Z5181 Encounter for therapeutic drug level monitoring: Secondary | ICD-10-CM

## 2017-11-16 DIAGNOSIS — I82409 Acute embolism and thrombosis of unspecified deep veins of unspecified lower extremity: Secondary | ICD-10-CM | POA: Diagnosis not present

## 2017-11-16 LAB — POCT INR: INR: 2.7 (ref 2.0–3.0)

## 2017-11-16 NOTE — Patient Instructions (Signed)
Description   Continue taking 1 tablet everyday. Recheck INR in 4 weeks. Call us with any medication changes or questions. # [id number]. Main Coumadin clinic [phone]

## 2017-12-14 ENCOUNTER — Ambulatory Visit (INDEPENDENT_AMBULATORY_CARE_PROVIDER_SITE_OTHER): Payer: Medicare Other | Admitting: *Deleted

## 2017-12-14 DIAGNOSIS — I82409 Acute embolism and thrombosis of unspecified deep veins of unspecified lower extremity: Secondary | ICD-10-CM | POA: Diagnosis not present

## 2017-12-14 DIAGNOSIS — Z5181 Encounter for therapeutic drug level monitoring: Secondary | ICD-10-CM

## 2017-12-14 LAB — POCT INR: INR: 2.1 (ref 2.0–3.0)

## 2017-12-14 NOTE — Patient Instructions (Signed)
Description   Today take 1.5 tablets then continue taking 1 tablet everyday. Recheck INR in 3 weeks. Call us with any medication changes or questions. # [id number]. Main Coumadin clinic [phone]

## 2018-01-13 ENCOUNTER — Ambulatory Visit (INDEPENDENT_AMBULATORY_CARE_PROVIDER_SITE_OTHER): Payer: Medicare Other

## 2018-01-13 ENCOUNTER — Other Ambulatory Visit: Payer: Self-pay | Admitting: Cardiovascular Disease

## 2018-01-13 DIAGNOSIS — Z5181 Encounter for therapeutic drug level monitoring: Secondary | ICD-10-CM

## 2018-01-13 DIAGNOSIS — I82409 Acute embolism and thrombosis of unspecified deep veins of unspecified lower extremity: Secondary | ICD-10-CM

## 2018-01-13 DIAGNOSIS — I82723 Chronic embolism and thrombosis of deep veins of upper extremity, bilateral: Secondary | ICD-10-CM

## 2018-01-13 LAB — POCT INR: INR: 2.1 (ref 2.0–3.0)

## 2018-01-13 NOTE — Patient Instructions (Signed)
Description   Start taking 1 tablet daily except 1.5 tablets on Fridays.  Recheck INR in 3 weeks. Call us with any medication changes or questions. # [id number]. Main Coumadin clinic [phone]

## 2018-01-25 ENCOUNTER — Encounter: Payer: Self-pay | Admitting: Internal Medicine

## 2018-01-30 ENCOUNTER — Encounter: Payer: Self-pay | Admitting: Internal Medicine

## 2018-02-01 ENCOUNTER — Ambulatory Visit (INDEPENDENT_AMBULATORY_CARE_PROVIDER_SITE_OTHER): Payer: Medicare Other | Admitting: *Deleted

## 2018-02-01 DIAGNOSIS — I82409 Acute embolism and thrombosis of unspecified deep veins of unspecified lower extremity: Secondary | ICD-10-CM | POA: Diagnosis not present

## 2018-02-01 DIAGNOSIS — Z5181 Encounter for therapeutic drug level monitoring: Secondary | ICD-10-CM

## 2018-02-01 LAB — POCT INR: INR: 2.3 (ref 2.0–3.0)

## 2018-02-01 NOTE — Patient Instructions (Addendum)
Description   Today take 1.5 tablets then start taking 1 tablet daily except 1.5 tablets on Mondays and Fridays.  Recheck INR in 2 weeks. Call us with any medication changes or questions. # [phone]. Main Coumadin clinic [address] THAT THE DOCTOR PRESCRIBES. COUMADIN CLINIC [phone]

## 2018-02-15 ENCOUNTER — Ambulatory Visit: Payer: Medicare Other

## 2018-02-15 DIAGNOSIS — Z5181 Encounter for therapeutic drug level monitoring: Secondary | ICD-10-CM

## 2018-02-15 DIAGNOSIS — I82409 Acute embolism and thrombosis of unspecified deep veins of unspecified lower extremity: Secondary | ICD-10-CM | POA: Diagnosis not present

## 2018-02-15 LAB — POCT INR: INR: 3 (ref 2.0–3.0)

## 2018-02-15 NOTE — Patient Instructions (Signed)
Description   Continue on same dosage 1 tablet daily except 1.5 tablets on Mondays and Fridays.  Recheck INR in 3 weeks. Call us with any medication changes or questions. # [phone]. Main Coumadin clinic [phone]

## 2018-03-08 ENCOUNTER — Ambulatory Visit: Payer: Medicare Other | Admitting: *Deleted

## 2018-03-08 DIAGNOSIS — I82409 Acute embolism and thrombosis of unspecified deep veins of unspecified lower extremity: Secondary | ICD-10-CM

## 2018-03-08 DIAGNOSIS — Z5181 Encounter for therapeutic drug level monitoring: Secondary | ICD-10-CM | POA: Diagnosis not present

## 2018-03-08 LAB — POCT INR: INR: 2.6 (ref 2.0–3.0)

## 2018-03-08 NOTE — Patient Instructions (Signed)
Description   Continue on same dosage 1 tablet daily except 1.5 tablets on Mondays and Fridays.  Recheck INR in 4 weeks. Call us with any medication changes or questions. # [id number]. Main Coumadin clinic [phone]

## 2018-04-04 ENCOUNTER — Ambulatory Visit: Payer: Medicare Other | Admitting: *Deleted

## 2018-04-04 DIAGNOSIS — I82409 Acute embolism and thrombosis of unspecified deep veins of unspecified lower extremity: Secondary | ICD-10-CM | POA: Diagnosis not present

## 2018-04-04 DIAGNOSIS — Z5181 Encounter for therapeutic drug level monitoring: Secondary | ICD-10-CM | POA: Diagnosis not present

## 2018-04-04 LAB — POCT INR: INR: 1.9 — AB (ref 2.0–3.0)

## 2018-04-04 NOTE — Patient Instructions (Signed)
Description   Today take 1.5 tablets, then Continue on same dosage 1 tablet daily except 1.5 tablets on Mondays and Fridays.  Recheck INR in 2 weeks. Call us with any medication changes or questions. # [id number]. Main Coumadin clinic [phone]

## 2018-04-18 ENCOUNTER — Ambulatory Visit: Payer: Self-pay | Admitting: *Deleted

## 2018-04-18 DIAGNOSIS — I82409 Acute embolism and thrombosis of unspecified deep veins of unspecified lower extremity: Secondary | ICD-10-CM

## 2018-04-18 DIAGNOSIS — Z5181 Encounter for therapeutic drug level monitoring: Secondary | ICD-10-CM

## 2018-04-18 LAB — POCT INR: INR: 3 (ref 2.0–3.0)

## 2018-04-18 NOTE — Patient Instructions (Signed)
Description   Continue on same dosage 1 tablet daily except 1.5 tablets on Mondays and Fridays. Recheck INR in 3 weeks. Call us with any medication changes or questions. # [id number]. Main Coumadin clinic [phone]

## 2018-05-02 ENCOUNTER — Ambulatory Visit (INDEPENDENT_AMBULATORY_CARE_PROVIDER_SITE_OTHER): Payer: BC Managed Care – PPO | Admitting: *Deleted

## 2018-05-02 DIAGNOSIS — I82409 Acute embolism and thrombosis of unspecified deep veins of unspecified lower extremity: Secondary | ICD-10-CM

## 2018-05-02 DIAGNOSIS — Z5181 Encounter for therapeutic drug level monitoring: Secondary | ICD-10-CM | POA: Diagnosis not present

## 2018-05-02 LAB — POCT INR: INR: 2.2 (ref 2.0–3.0)

## 2018-05-02 NOTE — Patient Instructions (Signed)
Description   Today take 1.5 tablets then continue on same dosage 1 tablet daily except 1.5 tablets on Mondays and Fridays. Recheck INR in 3 weeks. Call us with any medication changes or questions. # [id number]. Main Coumadin clinic [phone]

## 2018-05-24 ENCOUNTER — Ambulatory Visit (INDEPENDENT_AMBULATORY_CARE_PROVIDER_SITE_OTHER): Payer: BC Managed Care – PPO | Admitting: Pharmacist

## 2018-05-24 DIAGNOSIS — Z5181 Encounter for therapeutic drug level monitoring: Secondary | ICD-10-CM

## 2018-05-24 DIAGNOSIS — I82409 Acute embolism and thrombosis of unspecified deep veins of unspecified lower extremity: Secondary | ICD-10-CM | POA: Diagnosis not present

## 2018-05-24 LAB — POCT INR: INR: 3.6 — AB (ref 2.0–3.0)

## 2018-05-24 NOTE — Patient Instructions (Signed)
Description   Skip your Coumadin today, then continue taking 1 tablet daily except 1.5 tablets on Mondays and Fridays. Recheck INR in 3 weeks. Call us with any medication changes or questions. # [id number]. Main Coumadin clinic [phone]

## 2018-06-14 ENCOUNTER — Ambulatory Visit: Payer: BC Managed Care – PPO | Admitting: *Deleted

## 2018-06-14 ENCOUNTER — Other Ambulatory Visit: Payer: Self-pay

## 2018-06-14 DIAGNOSIS — I82409 Acute embolism and thrombosis of unspecified deep veins of unspecified lower extremity: Secondary | ICD-10-CM | POA: Diagnosis not present

## 2018-06-14 DIAGNOSIS — Z5181 Encounter for therapeutic drug level monitoring: Secondary | ICD-10-CM

## 2018-06-14 LAB — POCT INR: INR: 3.8 — AB (ref 2.0–3.0)

## 2018-06-14 NOTE — Patient Instructions (Signed)
Description   Skip your Coumadin today, then start taking 1 tablet daily except 1.5 tablets on Mondays. Recheck INR in 2 weeks. Call us with any medication changes or questions. # [phone]. Main Coumadin clinic [phone]

## 2018-06-26 ENCOUNTER — Telehealth: Payer: Self-pay

## 2018-06-26 NOTE — Telephone Encounter (Signed)
[place]

## 2018-06-27 NOTE — Telephone Encounter (Signed)
Returned call to pt and left a msg regarding the drive thru clinic and the need to reschedule

## 2018-06-28 ENCOUNTER — Ambulatory Visit (INDEPENDENT_AMBULATORY_CARE_PROVIDER_SITE_OTHER): Payer: BC Managed Care – PPO

## 2018-06-28 ENCOUNTER — Other Ambulatory Visit: Payer: Self-pay

## 2018-06-28 DIAGNOSIS — Z5181 Encounter for therapeutic drug level monitoring: Secondary | ICD-10-CM

## 2018-06-28 DIAGNOSIS — I82409 Acute embolism and thrombosis of unspecified deep veins of unspecified lower extremity: Secondary | ICD-10-CM

## 2018-06-28 LAB — POCT INR: INR: 3.8 — AB (ref 2.0–3.0)

## 2018-06-28 NOTE — Patient Instructions (Signed)
Description   Called spoke with pt's daughter, advised to have pt skip today's dosage of Coumadin, then start taking 1 tablet daily.  Recheck INR in 2 weeks. Call us with any medication changes or questions. # [id number]. Main Coumadin clinic [phone]

## 2018-07-11 ENCOUNTER — Telehealth: Payer: Self-pay | Admitting: *Deleted

## 2018-07-11 ENCOUNTER — Telehealth: Payer: Self-pay

## 2018-07-11 NOTE — Telephone Encounter (Signed)

## 2018-07-11 NOTE — Telephone Encounter (Signed)
1. Do you currently have a fever? No (yes = cancel and refer to pcp for e-visit) 2. Have you recently travelled on a cruise, internationally, or to [place], [place], [place], [place], [place], or [place], [place] [employer]) ? No (yes = cancel, stay home, monitor symptoms, and contact pcp or initiate e-visit if symptoms develop) 3. Have you been in contact with someone that is currently pending confirmation of Covid19 testing or has been confirmed to have the [place] virus?  No (yes = cancel, stay home, away from tested individual, monitor symptoms, and contact pcp or initiate e-visit if symptoms develop) 4. Are you currently experiencing fatigue or cough? No (yes = pt should be prepared to have a mask placed at the time of their visit). Discussed this with pts daughter, Caren Griffins.  Pt. Advised that we are restricting visitors at this time and anyone present in the vehicle should meet the above criteria as well. Advised that visit will be at curbside for finger stick ONLY and will receive call with instructions. Pt also advised to please bring own pen for signature of arrival document.

## 2018-07-11 NOTE — Telephone Encounter (Signed)
New Message    Pts daughter is returning call for [facility]

## 2018-07-11 NOTE — Telephone Encounter (Signed)
lmom for prescreen/drive thru 

## 2018-07-12 ENCOUNTER — Ambulatory Visit (INDEPENDENT_AMBULATORY_CARE_PROVIDER_SITE_OTHER): Payer: Medicare Other | Admitting: Pharmacist

## 2018-07-12 ENCOUNTER — Other Ambulatory Visit: Payer: Self-pay

## 2018-07-12 DIAGNOSIS — I82409 Acute embolism and thrombosis of unspecified deep veins of unspecified lower extremity: Secondary | ICD-10-CM

## 2018-07-12 DIAGNOSIS — Z5181 Encounter for therapeutic drug level monitoring: Secondary | ICD-10-CM

## 2018-07-12 LAB — POCT INR: INR: 2.2 (ref 2.0–3.0)

## 2018-07-14 ENCOUNTER — Telehealth: Payer: Self-pay | Admitting: Pharmacist

## 2018-07-14 MED ORDER — APIXABAN 2.5 MG PO TABS: 2.5000 mg | ORAL_TABLET | Freq: Two times a day (BID) | ORAL | 5 refills | Status: DC

## 2018-07-14 NOTE — Telephone Encounter (Signed)
Returned call to patient's daughter - she states she spoke with pt who is in agreement with changing to Eliquis. Advised her Dr [name] has approved of this switch as well. Advised pt to stop taking warfarin, and to start taking Eliquis 2.5mg  twice daily, first dose tomorrow AM.  Dosing for long term prevention of recurrent VTE = 2.5mg  BID with CrCl > 30.  1 month free copay card info given to pharmacy, follow up copays will be $47/month, daughter is ok with this.

## 2018-07-14 NOTE — Telephone Encounter (Signed)
-----   Message from Burnell Blanks, MD sent at 07/14/2018  8:46 AM EDT ----- Regarding: RE: DOAC? Icarus Partch, That would be fine with me. Gerald Stabs ----- Message ----- From: Leeroy Bock, [facility] Sent: 07/12/2018  12:08 PM EDT To: Burnell Blanks, MD Subject: DOAC?                                          Dr [name],  We have been trying to convert more warfarin patients to DOACs recently, especially to help decrease office visits during [place]. What are your thoughts on pt changing from warfarin to Eliquis for long term prevention of VTE reoccurrence? Her daughter wished for [place] to touch base with you before switching her anticoagulation.  Thanks, [employer]

## 2018-07-14 NOTE — Addendum Note (Signed)
Addended by: SUPPLE, MEGAN E on: 07/14/2018 08:59 AM   Modules accepted: Orders

## 2018-07-16 ENCOUNTER — Other Ambulatory Visit: Payer: Self-pay | Admitting: Cardiovascular Disease

## 2018-07-16 DIAGNOSIS — I82723 Chronic embolism and thrombosis of deep veins of upper extremity, bilateral: Secondary | ICD-10-CM

## 2018-10-04 ENCOUNTER — Encounter (HOSPITAL_COMMUNITY): Payer: Self-pay | Admitting: Emergency Medicine

## 2018-10-04 ENCOUNTER — Inpatient Hospital Stay (HOSPITAL_COMMUNITY)
Admission: EM | Admit: 2018-10-04 | Discharge: 2018-10-09 | DRG: 378 | Disposition: A | Discharge status: Home / Self Care | Payer: Medicare Other | Attending: Internal Medicine | Admitting: Internal Medicine

## 2018-10-04 ENCOUNTER — Other Ambulatory Visit: Payer: Self-pay

## 2018-10-04 DIAGNOSIS — Z7901 Long term (current) use of anticoagulants: Secondary | ICD-10-CM

## 2018-10-04 DIAGNOSIS — Z1159 Encounter for screening for other viral diseases: Secondary | ICD-10-CM

## 2018-10-04 DIAGNOSIS — R8271 Bacteriuria: Secondary | ICD-10-CM | POA: Diagnosis present

## 2018-10-04 DIAGNOSIS — Z6841 Body Mass Index (BMI) 40.0 and over, adult: Secondary | ICD-10-CM

## 2018-10-04 DIAGNOSIS — K449 Diaphragmatic hernia without obstruction or gangrene: Secondary | ICD-10-CM | POA: Diagnosis present

## 2018-10-04 DIAGNOSIS — I2510 Atherosclerotic heart disease of native coronary artery without angina pectoris: Secondary | ICD-10-CM | POA: Diagnosis present

## 2018-10-04 DIAGNOSIS — I252 Old myocardial infarction: Secondary | ICD-10-CM

## 2018-10-04 DIAGNOSIS — Z7982 Long term (current) use of aspirin: Secondary | ICD-10-CM

## 2018-10-04 DIAGNOSIS — D62 Acute posthemorrhagic anemia: Secondary | ICD-10-CM | POA: Diagnosis present

## 2018-10-04 DIAGNOSIS — G8929 Other chronic pain: Secondary | ICD-10-CM | POA: Diagnosis present

## 2018-10-04 DIAGNOSIS — K222 Esophageal obstruction: Secondary | ICD-10-CM | POA: Diagnosis present

## 2018-10-04 DIAGNOSIS — I10 Essential (primary) hypertension: Secondary | ICD-10-CM | POA: Diagnosis present

## 2018-10-04 DIAGNOSIS — K254 Chronic or unspecified gastric ulcer with hemorrhage: Principal | ICD-10-CM | POA: Diagnosis present

## 2018-10-04 DIAGNOSIS — I82409 Acute embolism and thrombosis of unspecified deep veins of unspecified lower extremity: Secondary | ICD-10-CM | POA: Diagnosis present

## 2018-10-04 DIAGNOSIS — K922 Gastrointestinal hemorrhage, unspecified: Secondary | ICD-10-CM

## 2018-10-04 DIAGNOSIS — K219 Gastro-esophageal reflux disease without esophagitis: Secondary | ICD-10-CM | POA: Diagnosis present

## 2018-10-04 DIAGNOSIS — I6529 Occlusion and stenosis of unspecified carotid artery: Secondary | ICD-10-CM | POA: Diagnosis present

## 2018-10-04 DIAGNOSIS — Z8349 Family history of other endocrine, nutritional and metabolic diseases: Secondary | ICD-10-CM

## 2018-10-04 DIAGNOSIS — R079 Chest pain, unspecified: Secondary | ICD-10-CM

## 2018-10-04 DIAGNOSIS — Z8673 Personal history of transient ischemic attack (TIA), and cerebral infarction without residual deficits: Secondary | ICD-10-CM

## 2018-10-04 DIAGNOSIS — R195 Other fecal abnormalities: Secondary | ICD-10-CM | POA: Diagnosis not present

## 2018-10-04 DIAGNOSIS — Z8249 Family history of ischemic heart disease and other diseases of the circulatory system: Secondary | ICD-10-CM

## 2018-10-04 DIAGNOSIS — Z86718 Personal history of other venous thrombosis and embolism: Secondary | ICD-10-CM

## 2018-10-04 DIAGNOSIS — Z833 Family history of diabetes mellitus: Secondary | ICD-10-CM

## 2018-10-04 DIAGNOSIS — E785 Hyperlipidemia, unspecified: Secondary | ICD-10-CM | POA: Diagnosis present

## 2018-10-04 DIAGNOSIS — Z955 Presence of coronary angioplasty implant and graft: Secondary | ICD-10-CM

## 2018-10-04 DIAGNOSIS — R55 Syncope and collapse: Secondary | ICD-10-CM

## 2018-10-04 DIAGNOSIS — Z820 Family history of epilepsy and other diseases of the nervous system: Secondary | ICD-10-CM

## 2018-10-04 MED ORDER — SODIUM CHLORIDE 0.9% FLUSH: 3.0000 mL | Freq: Once | INTRAVENOUS | Status: DC

## 2018-10-04 NOTE — ED Triage Notes (Signed)
Pt stated that her blood pressure was high at home and she had emesis X2.  No other complaints.

## 2018-10-05 ENCOUNTER — Encounter (HOSPITAL_COMMUNITY): Payer: Self-pay | Admitting: Certified Registered"

## 2018-10-05 DIAGNOSIS — Z820 Family history of epilepsy and other diseases of the nervous system: Secondary | ICD-10-CM | POA: Diagnosis not present

## 2018-10-05 DIAGNOSIS — R195 Other fecal abnormalities: Secondary | ICD-10-CM | POA: Diagnosis present

## 2018-10-05 DIAGNOSIS — K920 Hematemesis: Secondary | ICD-10-CM

## 2018-10-05 DIAGNOSIS — Z1159 Encounter for screening for other viral diseases: Secondary | ICD-10-CM | POA: Diagnosis not present

## 2018-10-05 DIAGNOSIS — Z6841 Body Mass Index (BMI) 40.0 and over, adult: Secondary | ICD-10-CM | POA: Diagnosis not present

## 2018-10-05 DIAGNOSIS — E785 Hyperlipidemia, unspecified: Secondary | ICD-10-CM | POA: Diagnosis present

## 2018-10-05 DIAGNOSIS — K222 Esophageal obstruction: Secondary | ICD-10-CM | POA: Diagnosis present

## 2018-10-05 DIAGNOSIS — I252 Old myocardial infarction: Secondary | ICD-10-CM | POA: Diagnosis not present

## 2018-10-05 DIAGNOSIS — G8929 Other chronic pain: Secondary | ICD-10-CM | POA: Diagnosis present

## 2018-10-05 DIAGNOSIS — Z7901 Long term (current) use of anticoagulants: Secondary | ICD-10-CM | POA: Diagnosis not present

## 2018-10-05 DIAGNOSIS — Z955 Presence of coronary angioplasty implant and graft: Secondary | ICD-10-CM | POA: Diagnosis not present

## 2018-10-05 DIAGNOSIS — Z8249 Family history of ischemic heart disease and other diseases of the circulatory system: Secondary | ICD-10-CM | POA: Diagnosis not present

## 2018-10-05 DIAGNOSIS — D689 Coagulation defect, unspecified: Secondary | ICD-10-CM | POA: Diagnosis not present

## 2018-10-05 DIAGNOSIS — R8271 Bacteriuria: Secondary | ICD-10-CM | POA: Diagnosis present

## 2018-10-05 DIAGNOSIS — I10 Essential (primary) hypertension: Secondary | ICD-10-CM | POA: Diagnosis present

## 2018-10-05 DIAGNOSIS — Z8673 Personal history of transient ischemic attack (TIA), and cerebral infarction without residual deficits: Secondary | ICD-10-CM

## 2018-10-05 DIAGNOSIS — I2510 Atherosclerotic heart disease of native coronary artery without angina pectoris: Secondary | ICD-10-CM | POA: Diagnosis present

## 2018-10-05 DIAGNOSIS — R1310 Dysphagia, unspecified: Secondary | ICD-10-CM

## 2018-10-05 DIAGNOSIS — I6529 Occlusion and stenosis of unspecified carotid artery: Secondary | ICD-10-CM | POA: Diagnosis present

## 2018-10-05 DIAGNOSIS — Z833 Family history of diabetes mellitus: Secondary | ICD-10-CM | POA: Diagnosis not present

## 2018-10-05 DIAGNOSIS — Z86718 Personal history of other venous thrombosis and embolism: Secondary | ICD-10-CM | POA: Diagnosis not present

## 2018-10-05 DIAGNOSIS — K921 Melena: Secondary | ICD-10-CM | POA: Diagnosis not present

## 2018-10-05 DIAGNOSIS — R1013 Epigastric pain: Secondary | ICD-10-CM | POA: Diagnosis not present

## 2018-10-05 DIAGNOSIS — K922 Gastrointestinal hemorrhage, unspecified: Secondary | ICD-10-CM | POA: Diagnosis not present

## 2018-10-05 DIAGNOSIS — D62 Acute posthemorrhagic anemia: Secondary | ICD-10-CM | POA: Diagnosis present

## 2018-10-05 DIAGNOSIS — K449 Diaphragmatic hernia without obstruction or gangrene: Secondary | ICD-10-CM

## 2018-10-05 DIAGNOSIS — K254 Chronic or unspecified gastric ulcer with hemorrhage: Secondary | ICD-10-CM | POA: Diagnosis present

## 2018-10-05 DIAGNOSIS — Z8349 Family history of other endocrine, nutritional and metabolic diseases: Secondary | ICD-10-CM | POA: Diagnosis not present

## 2018-10-05 DIAGNOSIS — Z7982 Long term (current) use of aspirin: Secondary | ICD-10-CM | POA: Diagnosis not present

## 2018-10-05 DIAGNOSIS — K219 Gastro-esophageal reflux disease without esophagitis: Secondary | ICD-10-CM | POA: Diagnosis present

## 2018-10-05 LAB — HEMOGLOBIN AND HEMATOCRIT, BLOOD
HCT: 29.3 % — ABNORMAL LOW (ref 36.0–46.0)
HCT: 28 % — ABNORMAL LOW (ref 36.0–46.0)
Hemoglobin: 9.6 g/dL — ABNORMAL LOW (ref 12.0–15.0)
Hemoglobin: 9 g/dL — ABNORMAL LOW (ref 12.0–15.0)

## 2018-10-05 LAB — COMPREHENSIVE METABOLIC PANEL
ALT: 23 U/L (ref 0–44)
AST: 20 U/L (ref 15–41)
Albumin: 3 g/dL — ABNORMAL LOW (ref 3.5–5.0)
Alkaline Phosphatase: 84 U/L (ref 38–126)
Anion gap: 8 (ref 5–15)
BUN: 32 mg/dL — ABNORMAL HIGH (ref 8–23)
CO2: 25 mmol/L (ref 22–32)
Calcium: 8.8 mg/dL — ABNORMAL LOW (ref 8.9–10.3)
Chloride: 101 mmol/L (ref 98–111)
Creatinine, Ser: 0.65 mg/dL (ref 0.44–1.00)
GFR calc Af Amer: >60 mL/min (ref >60)
GFR calc non Af Amer: >60 mL/min (ref >60)
Glucose, Bld: 157 mg/dL — ABNORMAL HIGH (ref 70–99)
Potassium: 3.8 mmol/L (ref 3.5–5.1)
Sodium: 134 mmol/L — ABNORMAL LOW (ref 135–145)
Total Bilirubin: 0.6 mg/dL (ref 0.3–1.2)
Total Protein: 6 g/dL — ABNORMAL LOW (ref 6.5–8.1)

## 2018-10-05 LAB — URINALYSIS, ROUTINE W REFLEX MICROSCOPIC
Bilirubin Urine: NEGATIVE
Glucose, UA: NEGATIVE mg/dL
Ketones, ur: 20 mg/dL — AB
Nitrite: NEGATIVE
Protein, ur: NEGATIVE mg/dL
RBC / HPF: >50 RBC/hpf — ABNORMAL HIGH (ref 0–5)
Specific Gravity, Urine: 1.023 (ref 1.005–1.030)
pH: 6 (ref 5.0–8.0)

## 2018-10-05 LAB — CBC
HCT: 37.9 % (ref 36.0–46.0)
Hemoglobin: 12.3 g/dL (ref 12.0–15.0)
MCH: 28.9 pg (ref 26.0–34.0)
MCHC: 32.5 g/dL (ref 30.0–36.0)
MCV: 89 fL (ref 80.0–100.0)
Platelets: 237 10*3/uL (ref 150–400)
RBC: 4.26 MIL/uL (ref 3.87–5.11)
RDW: 13.9 % (ref 11.5–15.5)
WBC: 12.2 10*3/uL — ABNORMAL HIGH (ref 4.0–10.5)
nRBC: 0 % (ref 0.0–0.2)

## 2018-10-05 LAB — ABO/RH: ABO/RH(D): A POS

## 2018-10-05 LAB — SARS CORONAVIRUS 2 BY RT PCR (HOSPITAL ORDER, PERFORMED IN ~~LOC~~ HOSPITAL LAB): SARS Coronavirus 2: NEGATIVE

## 2018-10-05 LAB — PROTIME-INR
INR: 1.8 — ABNORMAL HIGH (ref 0.8–1.2)
Prothrombin Time: 21 seconds — ABNORMAL HIGH (ref 11.4–15.2)

## 2018-10-05 LAB — TROPONIN I (HIGH SENSITIVITY): Troponin I (High Sensitivity): 5 ng/L (ref <18)

## 2018-10-05 LAB — POC OCCULT BLOOD, ED: Fecal Occult Bld: POSITIVE — AB

## 2018-10-05 LAB — LIPASE, BLOOD: Lipase: 24 U/L (ref 11–51)

## 2018-10-05 MED ORDER — ACETAMINOPHEN 325 MG PO TABS: 650.0000 mg | ORAL_TABLET | Freq: Four times a day (QID) | ORAL | Status: DC | PRN

## 2018-10-05 MED ORDER — PANTOPRAZOLE SODIUM 40 MG IV SOLR: 40.0000 mg | Freq: Two times a day (BID) | INTRAVENOUS | Status: DC

## 2018-10-05 MED ORDER — ACETAMINOPHEN 650 MG RE SUPP: 650.0000 mg | Freq: Four times a day (QID) | RECTAL | Status: DC | PRN

## 2018-10-05 MED ORDER — SODIUM CHLORIDE 0.9 % IV SOLN
8.0000 mg/h | INTRAVENOUS | Status: AC
  Administered 2018-10-05 – 2018-10-07 (×4): 8 mg/h via INTRAVENOUS
  Filled 2018-10-05 (×7): qty 80

## 2018-10-05 MED ORDER — SODIUM CHLORIDE 0.9 % IV SOLN
80.0000 mg | Freq: Once | INTRAVENOUS | Status: AC
  Administered 2018-10-05: 80 mg via INTRAVENOUS
  Filled 2018-10-05: qty 80

## 2018-10-05 MED ORDER — ONDANSETRON HCL 4 MG/2ML IJ SOLN: 4.0000 mg | Freq: Four times a day (QID) | INTRAMUSCULAR | Status: DC | PRN

## 2018-10-05 MED ORDER — SODIUM CHLORIDE 0.9 % IV BOLUS
500.0000 mL | Freq: Once | INTRAVENOUS | Status: AC
  Administered 2018-10-05: 500 mL via INTRAVENOUS

## 2018-10-05 MED ORDER — SODIUM CHLORIDE 0.9 % IV SOLN: INTRAVENOUS | Status: DC

## 2018-10-05 MED ORDER — SODIUM CHLORIDE 0.9 % IV SOLN
INTRAVENOUS | Status: AC
  Administered 2018-10-05 (×2): via INTRAVENOUS

## 2018-10-05 MED ORDER — PHYTONADIONE 5 MG PO TABS
2.5000 mg | ORAL_TABLET | Freq: Once | ORAL | Status: AC
  Administered 2018-10-05: 2.5 mg via ORAL
  Filled 2018-10-05: qty 1

## 2018-10-05 NOTE — ED Notes (Signed)
Report called to rn on 3e 

## 2018-10-05 NOTE — Anesthesia Preprocedure Evaluation (Deleted)
Anesthesia Evaluation    Reviewed: Allergy & Precautions, Patient's Chart, lab work & pertinent test results  Airway        Dental   Pulmonary sleep apnea ,           Cardiovascular hypertension, Pt. on medications and Pt. on home beta blockers + CAD, + Past MI, + Cardiac Stents (RCA) and + DVT       Neuro/Psych TIACVA    GI/Hepatic Neg liver ROS, GERD  ,  Endo/Other  Morbid obesity  Renal/GU negative Renal ROS     Musculoskeletal negative musculoskeletal ROS (+)   Abdominal   Peds  Hematology  (+) Blood dyscrasia (Eliquis), anemia ,   Anesthesia Other Findings Day of surgery medications reviewed with the patient.  Reproductive/Obstetrics                             Anesthesia Physical Anesthesia Plan  ASA: III  Anesthesia Plan: MAC   Post-op Pain Management:    Induction: Intravenous  PONV Risk Score and Plan: 2 and Propofol infusion and Treatment may vary due to age or medical condition  Airway Management Planned: Natural Airway and Nasal Cannula  Additional Equipment:   Intra-op Plan:   Post-operative Plan:   Informed Consent:   Plan Discussed with:   Anesthesia Plan Comments:         Anesthesia Quick Evaluation

## 2018-10-05 NOTE — ED Notes (Signed)
Unsuccessful iv attempts x 1

## 2018-10-05 NOTE — Progress Notes (Addendum)
PROGRESS NOTE    Shirley [name]  UTM:[id number] DOB: 03-06-1941 DOA: 10/04/2018 PCP: Merrilee Seashore, MD  Brief Narrative:  Shirley [name] is a 78 y.o. female with medical history significant of CAD status post PCI, DVT on Coumadin, GERD, hypertension, hyperlipidemia, CVA presenting to the hospital for evaluation of emesis and near syncope.  Patient states after eating dinner last night she did not feel good.  It felt like she was going to pass out so she sat down on a couch.  Soon after she had an episode of very dark brown vomit.  Subsequently had an episode of very dark stool.  Denies any abdominal pain.  Denies history of prior GI bleed.  States she takes a baby aspirin daily and Coumadin.  She has not been able to go for INR checks since March due to the current COVID pandemic.  Denies any change in her Coumadin dose.  Denies over-the-counter NSAID use.  Denies tobacco or alcohol use.  ED Course: Not tachycardic or hypotensive.  FOBT positive. Melanotic stool on exam.  White count 12.2.  Hemoglobin 12.3.  Lipase and LFTs normal.  UA with trace amount of leukocytes, 11-20 WBCs, rare bacteria, and negative nitrite.  High-sensitivity troponin negative.  EKG not suggestive of ACS.  500 cc IV fluid bolus ordered.   Assessment & Plan:   Principal Problem:   GI bleed Active Problems:   DVT (deep venous thrombosis) (HCC)   CAD in native artery, stent to RCA BMS with MI 2015   Asymptomatic bacteriuria   History of CVA (cerebrovascular accident)   Suspected upper GI bleed Presenting with complaints of acute onset coffee-ground emesis and melena - FOBT positive EGD done in 2012 showing a 2 cm hiatal hernia -otherwise unremarkable GI following - appreciate insight and recommendations Clear diet, upper endoscopy planned 10/06/2018  Continue PPI IV drip supportive care with zofran, phenergan as needed  Continue to hold coumadin and aspirin   Abnormal UA; UTI RULED OUT UA with  trace amount of leukocytes, 11-20 WBCs, rare bacteria, and negative nitrite.   Patient is not endorsing any UTI symptoms.   Antibiotics not currently indicated per IDSA guidelines  CAD status post PCI Stable, without symptoms.  High-sensitivity troponin negative and EKG not suggestive of ACS. Hold home aspirin in the setting of acute GI bleed  History of DVT  Hold home Coumadin in the setting of acute GI bleed  CVA Hold home aspirin in the setting of acute GI bleed  DVT prophylaxis: SCDs Code Status: Patient wishes to be full code. Family Communication: No family available. Disposition Plan:  Transfer patient to inpatient, patient continues to have melanotic stool in the setting of elevated INR, will need an additional 24 to 48 hours to safely evaluate, reduce INR so that patient is able to proceed with EGD further evaluate location of likely ongoing GI bleed Consults called: GI - [facility] Procedures:   EGD planned 10/06/18  Subjective: No acute issues or events overnight, patient declines chest pain, shortness of breath, nausea, vomiting, diarrhea, constipation, headache, fevers, chills.  Objective: Vitals:   10/05/18 0500 10/05/18 0609 10/05/18 0611 10/05/18 0743  BP: (!) 165/88  (!) 171/90 (!) 143/94  Pulse: 100  (!) 101 98  Resp: (!) 25  20 20   Temp:   98.1 F (36.7 C) 98.2 F (36.8 C)  TempSrc:   Oral Oral  SpO2: 99%  100% 100%  Weight:  119.8 kg    Height:  5\' 3"  (1.6  m)      Intake/Output Summary (Last 24 hours) at 10/05/2018 1203 Last data filed at 10/05/2018 0828 Gross per 24 hour  Intake 500 ml  Output -  Net 500 ml   Filed Weights   10/05/18 0609  Weight: 119.8 kg    Physical examination: General:  Pleasantly resting in bed, No acute distress. HEENT:  Normocephalic atraumatic.  Sclerae nonicteric, noninjected.  Extraocular movements intact bilaterally. Neck:  Without mass or deformity.  Trachea is midline. Lungs:  Clear to auscultate bilaterally  without rhonchi, wheeze, or rales. Heart:  Regular rate and rhythm.  Without murmurs, rubs, or gallops. Abdomen:  Soft, nontender, nondistended.  Without guarding or rebound. Extremities: Without cyanosis, clubbing, edema, or obvious deformity. Vascular:  Dorsalis pedis and posterior tibial pulses palpable bilaterally. Skin:  Warm and dry, no erythema, no ulcerations.  Data Reviewed: I have personally reviewed following labs and imaging studies  CBC: Recent Labs  Lab 10/04/18 2340 10/05/18 1011  WBC 12.2*  --   HGB 12.3 9.6*  HCT 37.9 29.3*  MCV 89.0  --   PLT 237  --    Basic Metabolic Panel: Recent Labs  Lab 10/04/18 2340  NA 134*  K 3.8  CL 101  CO2 25  GLUCOSE 157*  BUN 32*  CREATININE 0.65  CALCIUM 8.8*   GFR: Estimated Creatinine Clearance: 72.6 mL/min (by C-G formula based on SCr of 0.65 mg/dL). Liver Function Tests: Recent Labs  Lab 10/04/18 2340  AST 20  ALT 23  ALKPHOS 84  BILITOT 0.6  PROT 6.0*  ALBUMIN 3.0*   Recent Labs  Lab 10/04/18 2340  LIPASE 24   No results for input(s): AMMONIA in the last 168 hours. Coagulation Profile: Recent Labs  Lab 10/05/18 0525  INR 1.8*   Cardiac Enzymes: No results for input(s): CKTOTAL, CKMB, CKMBINDEX, TROPONINI in the last 168 hours. BNP (last 3 results) No results for input(s): PROBNP in the last 8760 hours. HbA1C: No results for input(s): HGBA1C in the last 72 hours. CBG: No results for input(s): GLUCAP in the last 168 hours. Lipid Profile: No results for input(s): CHOL, HDL, LDLCALC, TRIG, CHOLHDL, LDLDIRECT in the last 72 hours. Thyroid Function Tests: No results for input(s): TSH, T4TOTAL, FREET4, T3FREE, THYROIDAB in the last 72 hours. Anemia Panel: No results for input(s): VITAMINB12, FOLATE, FERRITIN, TIBC, IRON, RETICCTPCT in the last 72 hours. Sepsis Labs: No results for input(s): PROCALCITON, LATICACIDVEN in the last 168 hours.  Recent Results (from the past 240 hour(s))  SARS  Coronavirus 2 (CEPHEID - Performed in [place] hospital lab), Hosp Order     Status: None   Collection Time: 10/05/18  4:51 AM   Specimen: Nasopharyngeal Swab  Result Value Ref Range Status   SARS Coronavirus 2 NEGATIVE NEGATIVE Final    Comment: (NOTE) If result is NEGATIVE SARS-CoV-2 target nucleic acids are NOT DETECTED. The SARS-CoV-2 RNA is generally detectable in upper and lower  respiratory specimens during the acute phase of infection. The lowest  concentration of SARS-CoV-2 viral copies this assay can detect is 250  copies / mL. A negative result does not preclude SARS-CoV-2 infection  and should not be used as the sole basis for treatment or other  patient management decisions.  A negative result may occur with  improper specimen collection / handling, submission of specimen other  than nasopharyngeal swab, presence of viral mutation(s) within the  areas targeted by this assay, and inadequate number of viral copies  (<250 copies /  mL). A negative result must be combined with clinical  observations, patient history, and epidemiological information. If result is POSITIVE SARS-CoV-2 target nucleic acids are DETECTED. The SARS-CoV-2 RNA is generally detectable in upper and lower  respiratory specimens dur ing the acute phase of infection.  Positive  results are indicative of active infection with SARS-CoV-2.  Clinical  correlation with patient history and other diagnostic information is  necessary to determine patient infection status.  Positive results do  not rule out bacterial infection or co-infection with other viruses. If result is PRESUMPTIVE POSTIVE SARS-CoV-2 nucleic acids MAY BE PRESENT.   A presumptive positive result was obtained on the submitted specimen  and confirmed on repeat testing.  While 2019 novel coronavirus  (SARS-CoV-2) nucleic acids may be present in the submitted sample  additional confirmatory testing may be necessary for epidemiological  and / or  clinical management purposes  to differentiate between  SARS-CoV-2 and other Sarbecovirus currently known to infect humans.  If clinically indicated additional testing with an alternate test  methodology [phone]) is advised. The SARS-CoV-2 RNA is generally  detectable in upper and lower respiratory sp ecimens during the acute  phase of infection. The expected result is Negative. Fact Sheet for Patients:  StrictlyIdeas.no Fact Sheet for Healthcare Providers: BankingDealers.co.za This test is not yet approved or cleared by the [place] FDA and has been authorized for detection and/or diagnosis of SARS-CoV-2 by FDA under an Emergency Use Authorization (EUA).  This EUA will remain in effect (meaning this test can be used) for the duration of the COVID-19 declaration under Section 564(b)(1) of the Act, 21 U.S.C. section 360bbb-3(b)(1), unless the authorization is terminated or revoked sooner. Performed at [facility], [place] [address] [place], [place] [address]     Radiology Studies: No results found.  Scheduled Meds: . [START ON 10/08/2018] pantoprazole  40 mg Intravenous Q12H  . sodium chloride flush  3 mL Intravenous Once   Continuous Infusions: . sodium chloride 125 mL/hr at 10/05/18 0618  . sodium chloride    . pantoprozole (PROTONIX) infusion 8 mg/hr (10/05/18 0623)    LOS: 0 days   Time spent: greater than 35 minutes  Little Ishikawa, DO [facility]  If 7PM-7AM, please contact night-coverage [email or web address] Password [facility] 10/05/2018, 12:03 PM

## 2018-10-05 NOTE — Progress Notes (Signed)
Pt resting in bed./  No complaints. Talking with her daughter on the phone at this time./  Pt inquiring about the time of the EGD in the am./  Told pt that we can call early in the am to find out the time of the procedure and let her know so that she can make her family aware of the time of the procedure./

## 2018-10-05 NOTE — Consult Note (Signed)
[facility] Consult Note   History Shirley DODGEN MRN # [id number]  Date of Admission: 10/04/2018 Date of Consultation: 10/05/2018 Referring physician: Dr. Avon Gully, Luanna Cole, MD Primary Care Provider: Merrilee Seashore, MD Primary Gastroenterologist: None   Reason for Consultation/Chief Complaint: Hematemesis  Subjective  HPI:  This is a pleasant 78 year old woman came to the emergency department overnight for upper GI bleeding.  Yesterday afternoon she had nausea, generalized fatigue and a feeling of just being unwell.  She laid down for rest, and when she got up still felt the same, so called her daughter to come over.  She believes that she may have briefly lost consciousness, and then vomited black material and passed black tarry stool.  Her daughter called 911, bringing her to our emergency department.  Exam reportedly revealed melena, no further hematemesis since admission.  She still feels nauseated today with epigastric pain that is nonradiating.  She has chronic reflux symptoms with regurgitation and pyrosis.  There is been intermittent dysphagia for many years, including when Dr. Arelia Longest did an upper endoscopy in 2012 with bougie dilation of a Schatzki ring and identification of small hiatal hernia.  Today she denies chest pain dyspnea or dysuria.  He has not had chronic upper abdominal pain or nausea.  She had no new medicines, no known sick contacts, no fever or travel, and her rapid COVID test is negative.  ROS:  Constitutional: No weight loss   All other systems are negative except as noted above in the HPI  Past Medical History Past Medical History:  Diagnosis Date  . CAD in native artery, stent to RCA BMS with MI 2015 11/04/2003   LHC (11/2003) - LAD diffuse 30% stenosis of mid-vessel. RCA with 30% stenosis of proximal vessel - by Dr. Dannielle Burn. // 2D Echo (04/2006) -  LV EF 65%.  Mild LVH. No  wall motion abnormalities.  Mild diastolic dysfunction.    01/24/14 STEMI RCA -- procedure revealed 99% proximal RCA lesion. This was treated successfully with PTCA/bare metal stent x 1. Her systolic function is preserved.   . Chest pain 07/03/2014  . Chest wall pain 02/01/2014  . Coronary artery disease, non-occlusive 11/2003; 01/2014   LHC (11/2003) - LAD diffuse 30% stenosis of mid-vessel. RCA with 30% stenosis of proximal vessel - by Dr. Dannielle Burn. // 2D Echo (04/2006) -  LV EF 65%.  Mild LVH. No  wall motion abnormalities.  Mild diastolic dysfunction. 2015 with STEMI- stent palced  . Dizziness 02/01/2014  . DVT (deep venous thrombosis) ([place]) 2003   lower extremity DVT with questionable recurrence in 2005. Treated with 1.5 year course of coumadin.  [name] DVT (deep venous thrombosis), unspecified laterality 12/24/2013  . Encounter for therapeutic drug monitoring 12/08/2016  . Endometrial thickening on ultra sound 07/2009   path showing Fragments of benign endocervical and squamous mucosa. No dysplasia or malignancy // Followed by Dr. [name]  . GERD (gastroesophageal reflux disease)   . Hemorrhoid   . History of TIAs    2004 - no documentation available. 09/2010 -  was not on aspirin or statin therapy prior to event.  [name] Hx of myocardial infarction less than 8 weeks 01/24/14  . Hyperglycemia 09/23/2010  . Hyperlipidemia 09/23/2010  . Hyperlipidemia LDL goal <70   . Hypertension   . Insomnia 11/02/2010  . Memory difficulties 11/02/2010  . Morbid obesity ([place]) 09/16/2012  . Need for diphtheria-tetanus-pertussis (Tdap) vaccine, adult/adolescent 11/02/2010  . Obstructive sleep apnea 09/23/2010  . Prediabetes DX: 09/2010  HgA1c 6.1  . ST elevation myocardial infarction (STEMI) of inferior wall ([place]) 01/24/2014  . Stroke ([place])   . Syncope 12/18/2013  . TIA (transient ischemic attack) 09/23/2010   09/13/2010: CT, MRI/MRA no acute process. Carotid doppler: no stenosis. 2D-Echo Normal LV function with an EF 55-60%.   [name] UTI (urinary tract infection) 01/27/2014     Past Surgical History Past Surgical History:  Procedure Laterality Date  . COLONOSCOPY W/ POLYPECTOMY  11/01/2007   8 mm rectal adenoma, hemorrhoids  . CORONARY ANGIOPLASTY WITH STENT PLACEMENT  01/24/14   Inf. STEMI, BMS to RCA  . KNEE SURGERY  1996   Left knee arthroscopy.  [name] LEFT HEART CATHETERIZATION WITH CORONARY ANGIOGRAM N/A 01/24/2014   Procedure: LEFT HEART CATHETERIZATION WITH CORONARY ANGIOGRAM;  Surgeon: Burnell Blanks, MD;  Location: [facility] CATH LAB;  Service: Cardiovascular;  Laterality: N/A;  . ROTATOR CUFF REPAIR     right.  . TUBAL LIGATION      Family History Family History  Problem Relation Age of Onset  . Diabetes Mother   . Hyperlipidemia Mother   . Heart disease Mother   . Dementia Mother   . Angina Father   . Heart attack Father   . Thyroid cancer Sister   . Liver cancer Sister   . Heart disease Brother        x 2 in 50s yo  . Alzheimer's disease Maternal Grandmother   . Stroke Neg Hx     Social History Social History   Socioeconomic History  . Marital status: Married    Spouse name: Not on file  . Number of children: 6  . Years of education: College  . Highest education level: Not on file  Occupational History  . Occupation: Control and instrumentation engineer  . Occupation: [employer]    Employer: [employer]  Social Needs  . Financial resource strain: Not on file  . Food insecurity    Worry: Not on file    Inability: Not on file  . Transportation needs    Medical: Not on file    Non-medical: Not on file  Tobacco Use  . Smoking status: Never Smoker  . Smokeless tobacco: Never Used  Substance and Sexual Activity  . Alcohol use: No  . Drug use: No  . Sexual activity: Not on file  Lifestyle  . Physical activity    Days per week: Not on file    Minutes per session: Not on file  . Stress: Not on file  Relationships  . Social Herbalist on phone: Not on file    Gets together: Not on file    Attends  religious service: Not on file    Active member of club or organization: Not on file    Attends meetings of clubs or organizations: Not on file    Relationship status: Not on file  Other Topics Concern  . Not on file  Social History Narrative   Full Code status.   Insurance: BCBS    Allergies No Known Allergies  Outpatient Meds Home medications from the H+P and/or nursing med reconciliation reviewed.  The admission history and physical notes that the patient was on Eliquis, but in fact she is taking Coumadin.  She has been on Coumadin for years due to prior DVT.  Chart notes from early April indicate there was a plan to change her to Eliquis because she was unable to regularly get INR checks during COVID pandemic.  The  patient reports that she did not start it because she read the papers on it and was concerned about the risk of GI bleeding.  Therefore she stayed on Coumadin, but has not had regular INR checks in last couple of months.  Inpatient med list reviewed  _____________________________________________________________________ Objective   Exam:  Current vital signs  Patient Vitals for the past 8 hrs:  BP Temp Temp src Pulse Resp SpO2 Height Weight  10/05/18 0743 (!) 143/94 98.2 F (36.8 C) Oral 98 20 100 % - -  10/05/18 0611 (!) 171/90 98.1 F (36.7 C) Oral (!) 101 20 100 % - -  10/05/18 0609 - - - - - - 5\' 3"  (1.6 m) 119.8 kg  10/05/18 0500 (!) 165/88 - - 100 (!) 25 99 % - -  10/05/18 0400 (!) 157/95 - - - 13 - - -  10/05/18 0300 (!) 181/87 - - - (!) 8 - - -  10/05/18 0247 (!) 177/90 - - 90 15 - - -    Intake/Output Summary (Last 24 hours) at 10/05/2018 0931 Last data filed at 10/05/2018 2035 Gross per 24 hour  Intake 500 ml  Output -  Net 500 ml    Physical Exam:    General: this is an elderly patient in no acute distress  Eyes: sclera anicteric, no redness  ENT: oral mucosa moist without lesions, no cervical or supraclavicular lymphadenopathy  CV: RRR  without murmur, S1/S2, no JVD,, no peripheral edema  Resp: clear to auscultation bilaterally, normal RR and effort noted  GI: soft, morbidly obese, mild epigastric tenderness, with active bowel sounds. No guarding or palpable organomegaly noted, limited by body habitus  Skin; warm and dry, no rash or jaundice noted  Neuro: awake, alert and oriented x 3. Normal gross motor function and fluent speech.  Labs:  CBC Latest Ref Rng & Units 10/04/2018 01/05/2015 01/05/2015  WBC 4.0 - 10.5 K/uL 12.2(H) - 16.0(H)  Hemoglobin 12.0 - 15.0 g/dL 12.3 14.3 13.4  Hematocrit 36.0 - 46.0 % 37.9 42.0 39.9  Platelets 150 - 400 K/uL 237 - 248    CMP Latest Ref Rng & Units 10/04/2018 10/18/2017 01/05/2015  Glucose 70 - 99 mg/dL 157(H) 131(H) 100(H)  BUN 8 - 23 mg/dL 32(H) 11 18  Creatinine 0.44 - 1.00 mg/dL 0.65 0.68 0.70  Sodium 135 - 145 mmol/L 134(L) 132(L) 133(L)  Potassium 3.5 - 5.1 mmol/L 3.8 4.2 5.4(H)  Chloride 98 - 111 mmol/L 101 95(L) 99(L)  CO2 22 - 32 mmol/L 25 23 -  Calcium 8.9 - 10.3 mg/dL 8.8(L) 8.9 -  Total Protein 6.5 - 8.1 g/dL 6.0(L) - -  Total Bilirubin 0.3 - 1.2 mg/dL 0.6 - -  Alkaline Phos 38 - 126 U/L 84 - -  AST 15 - 41 U/L 20 - -  ALT 0 - 44 U/L 23 - -    Recent Labs  Lab 10/05/18 0525  INR 1.8*   Twelve-lead EKG with no ischemic changes.  High-sensitivity troponin normal at 5 Radiologic studies:   @ASSESSMENTPLANBEGIN @ Impression:  Hematemesis of coffee-ground material Melena Acute onset epigastric pain Chronic intermittent solid food dysphagia with known Schatzki ring and hiatal hernia (Thus far, admission hemoglobin normal.)  Coronary artery disease with RCA bare-metal stent placement nearly 5 years ago. History of lower extremity DVT on chronic oral anticoagulation.  She is coagulopathic on admission with INR of 1.8  Vital signs are stable, she does not appear to be having a brisk ongoing GI bleed.  She has IV access, is on a Protonix drip and has been  n.p.o.  Plan:  I discussed this with her at length and recommended upper endoscopy, which will most likely be performed tomorrow after time for INR to further decrease. She is agreeable after discussion of procedure and risks.  The benefits and risks of the planned procedure were described in detail with the patient or (when appropriate) their health care proxy.  Risks were outlined as including, but not limited to, bleeding, infection, perforation, adverse medication reaction leading to cardiac or pulmonary decompensation.  The limitation of incomplete mucosal visualization was also discussed.  No guarantees or warranties were given.  Patient at increased risk for cardiopulmonary complications of procedure due to medical comorbidities.(Coronary artery disease, chronic anticoagulation, morbid obesity)  I am giving her vitamin K 2 mg by mouth 1 dose today.  Every 8 hour hemoglobin and hematocrit, PT/INR tomorrow morning.  Liquids today, n.p.o. after midnight  Continue Protonix drip   Thank you for the courtesy of this consult.  Please contact me with any questions or concerns.  Nelida Meuse III Office: [phone]

## 2018-10-05 NOTE — ED Provider Notes (Signed)
[facility] EMERGENCY DEPARTMENT Provider Note   CSN: [id number] Arrival date & time: 10/04/18  2315     History   Chief Complaint Chief Complaint  Patient presents with  . Hypertension  . Emesis    HPI Shirley [name] is a 78 y.o. female.     Patient is a 78 year old female with past medical history of coronary artery disease with stents, GERD, obesity.  She presents today for evaluation of weakness, epigastric discomfort, and near syncope.  This occurred this afternoon, followed by episodes of emesis this evening that she describes as dark brown in color.  She has had also what she describes as dark stools.  She denies any fevers or chills.  She denies any ill contacts.  Patient is currently anticoagulated with Eliquis.  The history is provided by the patient.  Emesis Severity:  Moderate Duration:  3 hours Timing:  Constant Progression:  Unchanged Chronicity:  New   Past Medical History:  Diagnosis Date  . CAD in native artery, stent to RCA BMS with MI 2015 11/04/2003   LHC (11/2003) - LAD diffuse 30% stenosis of mid-vessel. RCA with 30% stenosis of proximal vessel - by Dr. Dannielle Burn. // 2D Echo (04/2006) -  LV EF 65%.  Mild LVH. No  wall motion abnormalities.  Mild diastolic dysfunction.   01/24/14 STEMI RCA -- procedure revealed 99% proximal RCA lesion. This was treated successfully with PTCA/bare metal stent x 1. Her systolic function is preserved.   . Chest pain 07/03/2014  . Chest wall pain 02/01/2014  . Coronary artery disease, non-occlusive 11/2003; 01/2014   LHC (11/2003) - LAD diffuse 30% stenosis of mid-vessel. RCA with 30% stenosis of proximal vessel - by Dr. Dannielle Burn. // 2D Echo (04/2006) -  LV EF 65%.  Mild LVH. No  wall motion abnormalities.  Mild diastolic dysfunction. 2015 with STEMI- stent palced  . Dizziness 02/01/2014  . DVT (deep venous thrombosis) ([place]) 2003   lower extremity DVT with questionable recurrence in 2005. Treated with 1.5  year course of coumadin.  [name] DVT (deep venous thrombosis), unspecified laterality 12/24/2013  . Encounter for therapeutic drug monitoring 12/08/2016  . Endometrial thickening on ultra sound 07/2009   path showing Fragments of benign endocervical and squamous mucosa. No dysplasia or malignancy // Followed by Dr. [name]  . GERD (gastroesophageal reflux disease)   . Hemorrhoid   . History of TIAs    2004 - no documentation available. 09/2010 -  was not on aspirin or statin therapy prior to event.  [name] Hx of myocardial infarction less than 8 weeks 01/24/14  . Hyperglycemia 09/23/2010  . Hyperlipidemia 09/23/2010  . Hyperlipidemia LDL goal <70   . Hypertension   . Insomnia 11/02/2010  . Memory difficulties 11/02/2010  . Morbid obesity ([place]) 09/16/2012  . Need for diphtheria-tetanus-pertussis (Tdap) vaccine, adult/adolescent 11/02/2010  . Obstructive sleep apnea 09/23/2010  . Prediabetes DX: 09/2010   HgA1c 6.1  . ST elevation myocardial infarction (STEMI) of inferior wall ([place]) 01/24/2014  . Stroke ([place])   . Syncope 12/18/2013  . TIA (transient ischemic attack) 09/23/2010   09/13/2010: CT, MRI/MRA no acute process. Carotid doppler: no stenosis. 2D-Echo Normal LV function with an EF 55-60%.   [name] UTI (urinary tract infection) 01/27/2014    Patient Active Problem List   Diagnosis Date Noted  . Chest pain 07/03/2014  . Dizziness 02/01/2014  . Chest wall pain 02/01/2014  . UTI (urinary tract infection) 01/27/2014  . ST elevation myocardial  infarction (STEMI) of inferior wall ([place]) 01/24/2014  . DVT (deep venous thrombosis), unspecified laterality 12/24/2013  . Morbid obesity ([place]) 09/16/2012  . Need for diphtheria-tetanus-pertussis (Tdap) vaccine, adult/adolescent 11/02/2010  . Insomnia 11/02/2010  . Memory difficulties 11/02/2010  . TIA (transient ischemic attack) 09/23/2010  . Hypertension 09/23/2010  . Hyperlipidemia 09/23/2010  . Hyperglycemia 09/23/2010  . Obstructive sleep apnea  09/23/2010  . Vaginal bleeding problems 09/23/2010  . DVT (deep venous thrombosis) ([place])   . GERD (gastroesophageal reflux disease) 07/07/2010  . Endometrial thickening on ultra sound 07/04/2009  . CAD in native artery, stent to RCA BMS with MI 2015 11/04/2003    Past Surgical History:  Procedure Laterality Date  . COLONOSCOPY W/ POLYPECTOMY  11/01/2007   8 mm rectal adenoma, hemorrhoids  . CORONARY ANGIOPLASTY WITH STENT PLACEMENT  01/24/14   Inf. STEMI, BMS to RCA  . KNEE SURGERY  1996   Left knee arthroscopy.  [name] LEFT HEART CATHETERIZATION WITH CORONARY ANGIOGRAM N/A 01/24/2014   Procedure: LEFT HEART CATHETERIZATION WITH CORONARY ANGIOGRAM;  Surgeon: Burnell Blanks, MD;  Location: [facility] CATH LAB;  Service: Cardiovascular;  Laterality: N/A;  . ROTATOR CUFF REPAIR     right.  . TUBAL LIGATION       OB History   No obstetric history on file.      Home Medications    Prior to Admission medications   Medication Sig Start Date End Date Taking? Authorizing Provider  acetaminophen (TYLENOL) 500 MG tablet Take 500 mg by mouth every 6 (six) hours as needed for mild pain.     [provider]  apixaban (ELIQUIS) 2.5 MG TABS tablet Take 1 tablet (2.5 mg total) by mouth 2 (two) times daily. 07/14/18   Burnell Blanks, MD  aspirin EC 81 MG tablet Take 1 tablet (81 mg total) by mouth daily. 03/07/15   Burnell Blanks, MD  atorvastatin (LIPITOR) 80 MG tablet TAKE 1 TABLET BY MOUTH DAILY AT [facility] 10/12/17   Burnell Blanks, MD  cimetidine (TAGAMET) 200 MG tablet Take 200 mg by mouth 3 (three) times daily.    [provider]  hydrOXYzine (ATARAX/VISTARIL) 25 MG tablet Take 25 mg by mouth daily as needed.    [provider]  levocetirizine (XYZAL ALLERGY 24HR) 5 MG tablet Take 5 mg by mouth every evening.    [provider]  lisinopril-hydrochlorothiazide (ZESTORETIC) 20-25 MG tablet Take 1 tablet by mouth daily. 10/10/17   Burnell Blanks, MD  metoprolol succinate (TOPROL-XL) 25 MG 24 hr tablet TAKE 1 TABLET BY MOUTH EVERY DAY WITH OR IMMEDIATELY FOLLOWING A MEAL 01/13/18   Burnell Blanks, MD  Multiple Vitamin (MULTIVITAMIN WITH MINERALS) TABS Take 1 tablet by mouth every morning.    [provider]  nitroGLYCERIN (NITROSTAT) 0.4 MG SL tablet Place 1 tablet (0.4 mg total) under the tongue every 5 (five) minutes x 3 doses as needed for chest pain. 09/07/17   Belva Crome, MD    Family History Family History  Problem Relation Age of Onset  . Diabetes Mother   . Hyperlipidemia Mother   . Heart disease Mother   . Dementia Mother   . Angina Father   . Heart attack Father   . Thyroid cancer Sister   . Liver cancer Sister   . Heart disease Brother        x 2 in 37s yo  . Alzheimer's disease Maternal Grandmother   . Stroke Neg Hx  Social History Social History   Tobacco Use  . Smoking status: Never Smoker  . Smokeless tobacco: Never Used  Substance Use Topics  . Alcohol use: No  . Drug use: No     Allergies   Patient has no known allergies.   Review of Systems Review of Systems  Gastrointestinal: Positive for vomiting.  All other systems reviewed and are negative.    Physical Exam Updated Vital Signs BP 127/72 (BP Location: Right Arm)   Pulse 86   Temp 98 F (36.7 C) (Oral)   SpO2 100%   Physical Exam Vitals signs and nursing note reviewed.  Constitutional:      General: She is not in acute distress.    Appearance: She is well-developed. She is not diaphoretic.  HENT:     Head: Normocephalic and atraumatic.  Neck:     Musculoskeletal: Normal range of motion and neck supple.  Cardiovascular:     Rate and Rhythm: Normal rate and regular rhythm.     Heart sounds: No murmur. No friction rub. No gallop.   Pulmonary:     Effort: Pulmonary effort is normal. No respiratory distress.     Breath sounds: Normal breath sounds. No wheezing.  Abdominal:     General:  Bowel sounds are normal. There is no distension.     Palpations: Abdomen is soft.     Tenderness: There is no abdominal tenderness.  Musculoskeletal: Normal range of motion.  Skin:    General: Skin is warm and dry.  Neurological:     Mental Status: She is alert and oriented to person, place, and time.      ED Treatments / Results  Labs (all labs ordered are listed, but only abnormal results are displayed) Labs Reviewed  COMPREHENSIVE METABOLIC PANEL - Abnormal; Notable for the following components:      Result Value   Sodium 134 (*)    Glucose, Bld 157 (*)    BUN 32 (*)    Calcium 8.8 (*)    Total Protein 6.0 (*)    Albumin 3.0 (*)    All other components within normal limits  CBC - Abnormal; Notable for the following components:   WBC 12.2 (*)    All other components within normal limits  URINALYSIS, ROUTINE W REFLEX MICROSCOPIC - Abnormal; Notable for the following components:   Hgb urine dipstick MODERATE (*)    Ketones, ur 20 (*)    Leukocytes,Ua TRACE (*)    RBC / HPF >50 (*)    Bacteria, UA RARE (*)    Non Squamous Epithelial 0-5 (*)    All other components within normal limits  LIPASE, BLOOD  POC OCCULT BLOOD, ED    EKG None  Radiology No results found.  Procedures Procedures (including critical care time)  Medications Ordered in ED Medications  sodium chloride flush (NS) 0.9 % injection 3 mL (has no administration in time range)  sodium chloride 0.9 % bolus 500 mL (has no administration in time range)     Initial Impression / Assessment and Plan / ED Course  I have reviewed the triage vital signs and the nursing notes.  Pertinent labs & imaging results that were available during my care of the patient were reviewed by me and considered in my medical decision making (see chart for details).  Patient is a 78 year old female with history of coronary artery disease on Eliquis.  She presents today for evaluation of nausea, near syncope, and 2 episodes  of dark  brown emesis.  She also reports an episode of melanotic stool.  Her stool is brightly heme positive.  She is hemodynamically stable and hemoglobin is 12.3.  Patient does have an elevated BUN of 32.  My concern is that of a possible upper GI bleed.  Due to the fact that she is on a blood thinner, I feel as though admission for observation is indicated.  I have spoken with Dr. Marlowe Sax who agrees to admit.  Final Clinical Impressions(s) / ED Diagnoses   Final diagnoses:  None    ED Discharge Orders    None       Veryl Speak, MD 10/05/18 [phone]

## 2018-10-05 NOTE — ED Notes (Signed)
Attempted to obtain IV acess x2. Attempts unsuccessful. Primary RN: Gerald Stabs notified.

## 2018-10-05 NOTE — H&P (Signed)
History and Physical    DON GIARRUSSO FUX:[id number] DOB: [birth year] DOA: 10/04/2018  PCP: Merrilee Seashore, MD Patient coming from: Home  Chief Complaint: Emesis, near syncope  HPI: Shirley [name] is a 78 y.o. female with medical history significant of CAD status post PCI, DVT on Coumadin, GERD, hypertension, hyperlipidemia, CVA presenting to the hospital for evaluation of emesis and near syncope.  Patient states after eating dinner last night she did not feel good.  It felt like she was going to pass out so she sat down on a couch.  Soon after she had an episode of very dark brown vomit.  Subsequently had an episode of very dark stool.  Denies any abdominal pain.  Denies history of prior GI bleed.  States she takes a baby aspirin daily and Coumadin.  She has not been able to go for INR checks since March due to the current COVID pandemic.  Denies any change in her Coumadin dose.  Denies over-the-counter NSAID use.  Denies tobacco or alcohol use.  ED Course: Not tachycardic or hypotensive.  FOBT positive. Melanotic stool on exam.  White count 12.2.  Hemoglobin 12.3.  Lipase and LFTs normal.  UA with trace amount of leukocytes, 11-20 WBCs, rare bacteria, and negative nitrite.  High-sensitivity troponin negative.  EKG not suggestive of ACS.  500 cc IV fluid bolus ordered.  Review of Systems:  All systems reviewed and apart from history of presenting illness, are negative.  Past Medical History:  Diagnosis Date  . CAD in native artery, stent to RCA BMS with MI 2015 11/04/2003   LHC (11/2003) - LAD diffuse 30% stenosis of mid-vessel. RCA with 30% stenosis of proximal vessel - by Dr. Dannielle Burn. // 2D Echo (04/2006) -  LV EF 65%.  Mild LVH. No  wall motion abnormalities.  Mild diastolic dysfunction.   01/24/14 STEMI RCA -- procedure revealed 99% proximal RCA lesion. This was treated successfully with PTCA/bare metal stent x 1. Her systolic function is preserved.   . Chest pain 07/03/2014  .  Chest wall pain 02/01/2014  . Coronary artery disease, non-occlusive 11/2003; 01/2014   LHC (11/2003) - LAD diffuse 30% stenosis of mid-vessel. RCA with 30% stenosis of proximal vessel - by Dr. Dannielle Burn. // 2D Echo (04/2006) -  LV EF 65%.  Mild LVH. No  wall motion abnormalities.  Mild diastolic dysfunction. 2015 with STEMI- stent palced  . Dizziness 02/01/2014  . DVT (deep venous thrombosis) ([place]) 2003   lower extremity DVT with questionable recurrence in 2005. Treated with 1.5 year course of coumadin.  [name] DVT (deep venous thrombosis), unspecified laterality 12/24/2013  . Encounter for therapeutic drug monitoring 12/08/2016  . Endometrial thickening on ultra sound 07/2009   path showing Fragments of benign endocervical and squamous mucosa. No dysplasia or malignancy // Followed by Dr. [name]  . GERD (gastroesophageal reflux disease)   . Hemorrhoid   . History of TIAs    2004 - no documentation available. 09/2010 -  was not on aspirin or statin therapy prior to event.  [name] Hx of myocardial infarction less than 8 weeks 01/24/14  . Hyperglycemia 09/23/2010  . Hyperlipidemia 09/23/2010  . Hyperlipidemia LDL goal <70   . Hypertension   . Insomnia 11/02/2010  . Memory difficulties 11/02/2010  . Morbid obesity ([place]) 09/16/2012  . Need for diphtheria-tetanus-pertussis (Tdap) vaccine, adult/adolescent 11/02/2010  . Obstructive sleep apnea 09/23/2010  . Prediabetes DX: 09/2010   HgA1c 6.1  . ST elevation myocardial infarction (STEMI) of  inferior wall ([place]) 01/24/2014  . Stroke ([place])   . Syncope 12/18/2013  . TIA (transient ischemic attack) 09/23/2010   09/13/2010: CT, MRI/MRA no acute process. Carotid doppler: no stenosis. 2D-Echo Normal LV function with an EF 55-60%.   [name] UTI (urinary tract infection) 01/27/2014    Past Surgical History:  Procedure Laterality Date  . COLONOSCOPY W/ POLYPECTOMY  11/01/2007   8 mm rectal adenoma, hemorrhoids  . CORONARY ANGIOPLASTY WITH STENT PLACEMENT  01/24/14   Inf.  STEMI, BMS to RCA  . KNEE SURGERY  1996   Left knee arthroscopy.  [name] LEFT HEART CATHETERIZATION WITH CORONARY ANGIOGRAM N/A 01/24/2014   Procedure: LEFT HEART CATHETERIZATION WITH CORONARY ANGIOGRAM;  Surgeon: Burnell Blanks, MD;  Location: [facility] CATH LAB;  Service: Cardiovascular;  Laterality: N/A;  . ROTATOR CUFF REPAIR     right.  . TUBAL LIGATION       reports that she has never smoked. She has never used smokeless tobacco. She reports that she does not drink alcohol or use drugs.  No Known Allergies  Family History  Problem Relation Age of Onset  . Diabetes Mother   . Hyperlipidemia Mother   . Heart disease Mother   . Dementia Mother   . Angina Father   . Heart attack Father   . Thyroid cancer Sister   . Liver cancer Sister   . Heart disease Brother        x 2 in 37s yo  . Alzheimer's disease Maternal Grandmother   . Stroke Neg Hx     Prior to Admission medications   Medication Sig Start Date End Date Taking? Authorizing Provider  acetaminophen (TYLENOL) 500 MG tablet Take 500 mg by mouth every 6 (six) hours as needed for mild pain.     [provider]  apixaban (ELIQUIS) 2.5 MG TABS tablet Take 1 tablet (2.5 mg total) by mouth 2 (two) times daily. 07/14/18   Burnell Blanks, MD  aspirin EC 81 MG tablet Take 1 tablet (81 mg total) by mouth daily. 03/07/15   Burnell Blanks, MD  atorvastatin (LIPITOR) 80 MG tablet TAKE 1 TABLET BY MOUTH DAILY AT [facility] 10/12/17   Burnell Blanks, MD  cimetidine (TAGAMET) 200 MG tablet Take 200 mg by mouth 3 (three) times daily.    [provider]  hydrOXYzine (ATARAX/VISTARIL) 25 MG tablet Take 25 mg by mouth daily as needed.    [provider]  levocetirizine (XYZAL ALLERGY 24HR) 5 MG tablet Take 5 mg by mouth every evening.    [provider]  lisinopril-hydrochlorothiazide (ZESTORETIC) 20-25 MG tablet Take 1 tablet by mouth daily. 10/10/17   Burnell Blanks, MD  metoprolol  succinate (TOPROL-XL) 25 MG 24 hr tablet TAKE 1 TABLET BY MOUTH EVERY DAY WITH OR IMMEDIATELY FOLLOWING A MEAL 01/13/18   Burnell Blanks, MD  Multiple Vitamin (MULTIVITAMIN WITH MINERALS) TABS Take 1 tablet by mouth every morning.    [provider]  nitroGLYCERIN (NITROSTAT) 0.4 MG SL tablet Place 1 tablet (0.4 mg total) under the tongue every 5 (five) minutes x 3 doses as needed for chest pain. 09/07/17   Belva Crome, MD    Physical Exam: Vitals:   10/04/18 2322 10/05/18 0247 10/05/18 0300 10/05/18 0400  BP: 127/72 (!) 177/90 (!) 181/87 (!) 157/95  Pulse: 86 90    Resp:  15 (!) 8 13  Temp: 98 F (36.7 C)     TempSrc: Oral  SpO2: 100%       Physical Exam  Constitutional: She is oriented to person, place, and time. She appears well-developed and well-nourished. No distress.  HENT:  Head: Normocephalic.  Dry mucous membranes  Eyes: Right eye exhibits no discharge. Left eye exhibits no discharge.  Neck: Neck supple.  Cardiovascular: Normal rate, regular rhythm and intact distal pulses.  Pulmonary/Chest: Effort normal and breath sounds normal. No respiratory distress. She has no wheezes. She has no rales.  Abdominal: Soft. Bowel sounds are normal. She exhibits no distension. There is no abdominal tenderness. There is no rebound and no guarding.  Musculoskeletal:        General: Edema present.     Comments: +3 pedal edema (chronic per patient)  Neurological: She is alert and oriented to person, place, and time.  Skin: Skin is warm and dry. She is not diaphoretic.     Labs on Admission: I have personally reviewed following labs and imaging studies  CBC: Recent Labs  Lab 10/04/18 2340  WBC 12.2*  HGB 12.3  HCT 37.9  MCV 89.0  PLT 979   Basic Metabolic Panel: Recent Labs  Lab 10/04/18 2340  NA 134*  K 3.8  CL 101  CO2 25  GLUCOSE 157*  BUN 32*  CREATININE 0.65  CALCIUM 8.8*   GFR: CrCl cannot be calculated (Unknown ideal weight.). Liver  Function Tests: Recent Labs  Lab 10/04/18 2340  AST 20  ALT 23  ALKPHOS 84  BILITOT 0.6  PROT 6.0*  ALBUMIN 3.0*   Recent Labs  Lab 10/04/18 2340  LIPASE 24   No results for input(s): AMMONIA in the last 168 hours. Coagulation Profile: No results for input(s): INR, PROTIME in the last 168 hours. Cardiac Enzymes: No results for input(s): CKTOTAL, CKMB, CKMBINDEX, TROPONINI in the last 168 hours. BNP (last 3 results) No results for input(s): PROBNP in the last 8760 hours. HbA1C: No results for input(s): HGBA1C in the last 72 hours. CBG: No results for input(s): GLUCAP in the last 168 hours. Lipid Profile: No results for input(s): CHOL, HDL, LDLCALC, TRIG, CHOLHDL, LDLDIRECT in the last 72 hours. Thyroid Function Tests: No results for input(s): TSH, T4TOTAL, FREET4, T3FREE, THYROIDAB in the last 72 hours. Anemia Panel: No results for input(s): VITAMINB12, FOLATE, FERRITIN, TIBC, IRON, RETICCTPCT in the last 72 hours. Urine analysis:    Component Value Date/Time   COLORURINE YELLOW 10/04/2018 2327   APPEARANCEUR CLEAR 10/04/2018 2327   LABSPEC 1.023 10/04/2018 2327   PHURINE 6.0 10/04/2018 2327   GLUCOSEU NEGATIVE 10/04/2018 2327   HGBUR MODERATE (A) 10/04/2018 2327   BILIRUBINUR NEGATIVE 10/04/2018 2327   KETONESUR 20 (A) 10/04/2018 2327   PROTEINUR NEGATIVE 10/04/2018 2327   UROBILINOGEN 1.0 01/25/2014 1623   NITRITE NEGATIVE 10/04/2018 2327   LEUKOCYTESUR TRACE (A) 10/04/2018 2327    Radiological Exams on Admission: No results found.  EKG: Independently reviewed.  Sinus rhythm, incomplete RBBB, LAFB.  No significant change since prior tracing.  Assessment/Plan Principal Problem:   GI bleed Active Problems:   DVT (deep venous thrombosis) (HCC)   CAD in native artery, stent to RCA BMS with MI 2015   Asymptomatic bacteriuria   History of CVA (cerebrovascular accident)   Suspected upper GI bleed Presenting with complaints of acute onset coffee-ground emesis  and melena.  Hemodynamically stable.  Hemoglobin 12.3.  FOBT positive and noted to have melanotic stool on exam.  Differentials include peptic ulcer disease and gastritis.  Anticoagulation and antiplatelet agent use also  contributing. EGD done in 2012 showing a 2 cm hiatal hernia, otherwise normal examination.  No recent EGD results in the chart. -Type and screen -2 large-bore IVs -Keep n.p.o. -IV fluid hydration -IV PPI bolus and infusion -Continue to monitor CBC -IV Zofran PRN -Consult GI in a.m. for EGD -Hold Coumadin and aspirin -Check INR  Asymptomatic bacteriuria UA with trace amount of leukocytes, 11-20 WBCs, rare bacteria, and negative nitrite.  Patient is not endorsing any UTI symptoms.  White count 12.2, likely reactive in the setting of acute GI bleed.  Patient is afebrile. -Urine culture  CAD status post PCI -Stable.  Patient is not endorsing any anginal symptoms.  High-sensitivity troponin negative and EKG not suggestive of ACS. -Hold home aspirin in the setting of acute GI bleed  History of DVT  -Hold home Coumadin in the setting of acute GI bleed  CVA -Hold home aspirin in the setting of acute GI bleed  DVT prophylaxis: SCDs Code Status: Patient wishes to be full code. Family Communication: No family available. Disposition Plan: Anticipate discharge after clinical improvement. Consults called: None Admission status: It is my clinical opinion that referral for OBSERVATION is reasonable and necessary in this patient based on the above information provided. The aforementioned taken together are felt to place the patient at high risk for further clinical deterioration. However it is anticipated that the patient may be medically stable for discharge from the hospital within 24 to 48 hours.  The medical decision making on this patient was of high complexity and the patient is at high risk for clinical deterioration, therefore this is a level 3 visit.  Shela Leff  MD [facility] Pager [phone]  If 7PM-7AM, please contact night-coverage [email or web address] Password [email or web address]  10/05/2018, 5:06 AM

## 2018-10-05 NOTE — Progress Notes (Signed)
Pt alert and oriented x4, no complaints of pain or discomfort.  Bed in low position, call bell within reach.  Bed alarms on and functioning.  Assessment done and charted.  IVF and Protonix infusing.  Pt enc to call for assistance when getting OOB./  Pt verbalizes understanding./  Pt made aware that she can have CL till midnight tonight for an EGD in the am..Will continue to monitor and do hourly rounding throughout the shift

## 2018-10-06 ENCOUNTER — Inpatient Hospital Stay (HOSPITAL_COMMUNITY): Payer: Medicare Other | Admitting: Certified Registered"

## 2018-10-06 ENCOUNTER — Encounter (HOSPITAL_COMMUNITY)
Admission: EM | Disposition: A | Discharge status: Home / Self Care | Payer: Self-pay | Source: Home / Self Care | Attending: Internal Medicine

## 2018-10-06 DIAGNOSIS — K922 Gastrointestinal hemorrhage, unspecified: Secondary | ICD-10-CM

## 2018-10-06 DIAGNOSIS — R1013 Epigastric pain: Secondary | ICD-10-CM

## 2018-10-06 DIAGNOSIS — G8929 Other chronic pain: Secondary | ICD-10-CM

## 2018-10-06 DIAGNOSIS — R112 Nausea with vomiting, unspecified: Secondary | ICD-10-CM

## 2018-10-06 HISTORY — PX: ESOPHAGOGASTRODUODENOSCOPY: SHX5428

## 2018-10-06 HISTORY — PX: SCLEROTHERAPY: SHX6841

## 2018-10-06 HISTORY — PX: HOT HEMOSTASIS: SHX5433

## 2018-10-06 LAB — PROTIME-INR
INR: 2.1 — ABNORMAL HIGH (ref 0.8–1.2)
Prothrombin Time: 23.3 seconds — ABNORMAL HIGH (ref 11.4–15.2)

## 2018-10-06 LAB — CBC
HCT: 27.2 % — ABNORMAL LOW (ref 36.0–46.0)
Hemoglobin: 9.3 g/dL — ABNORMAL LOW (ref 12.0–15.0)
MCH: 29.6 pg (ref 26.0–34.0)
MCHC: 34.2 g/dL (ref 30.0–36.0)
MCV: 86.6 fL (ref 80.0–100.0)
Platelets: 183 10*3/uL (ref 150–400)
RBC: 3.14 MIL/uL — ABNORMAL LOW (ref 3.87–5.11)
RDW: 13.8 % (ref 11.5–15.5)
WBC: 13.9 10*3/uL — ABNORMAL HIGH (ref 4.0–10.5)
nRBC: 0.1 % (ref 0.0–0.2)

## 2018-10-06 LAB — GLUCOSE, CAPILLARY: Glucose-Capillary: 117 mg/dL — ABNORMAL HIGH (ref 70–99)

## 2018-10-06 LAB — PREPARE RBC (CROSSMATCH)

## 2018-10-06 LAB — HEMOGLOBIN AND HEMATOCRIT, BLOOD
HCT: 24.1 % — ABNORMAL LOW (ref 36.0–46.0)
HCT: 23.2 % — ABNORMAL LOW (ref 36.0–46.0)
Hemoglobin: 7.9 g/dL — ABNORMAL LOW (ref 12.0–15.0)
Hemoglobin: 7.4 g/dL — ABNORMAL LOW (ref 12.0–15.0)

## 2018-10-06 LAB — MRSA PCR SCREENING: MRSA by PCR: NEGATIVE

## 2018-10-06 SURGERY — EGD (ESOPHAGOGASTRODUODENOSCOPY)
Anesthesia: Monitor Anesthesia Care

## 2018-10-06 SURGERY — EGD (ESOPHAGOGASTRODUODENOSCOPY)
Anesthesia: General

## 2018-10-06 MED ORDER — FENTANYL CITRATE (PF) 250 MCG/5ML IJ SOLN
INTRAMUSCULAR | Status: DC | PRN
  Administered 2018-10-06 (×2): 50 ug via INTRAVENOUS

## 2018-10-06 MED ORDER — DEXAMETHASONE SODIUM PHOSPHATE 10 MG/ML IJ SOLN
INTRAMUSCULAR | Status: DC | PRN
  Administered 2018-10-06: 10 mg via INTRAVENOUS

## 2018-10-06 MED ORDER — PROPOFOL 10 MG/ML IV BOLUS
INTRAVENOUS | Status: DC | PRN
  Administered 2018-10-06: 170 mg via INTRAVENOUS

## 2018-10-06 MED ORDER — EPINEPHRINE 1 MG/10ML IJ SOSY
PREFILLED_SYRINGE | INTRAMUSCULAR | Status: AC
  Filled 2018-10-06: qty 10

## 2018-10-06 MED ORDER — PHYTONADIONE 5 MG PO TABS
5.0000 mg | ORAL_TABLET | Freq: Once | ORAL | Status: AC
  Administered 2018-10-06: 5 mg via ORAL
  Filled 2018-10-06: qty 1

## 2018-10-06 MED ORDER — FENTANYL CITRATE (PF) 100 MCG/2ML IJ SOLN
INTRAMUSCULAR | Status: AC
  Filled 2018-10-06: qty 2

## 2018-10-06 MED ORDER — SODIUM CHLORIDE 0.9% IV SOLUTION: Freq: Once | INTRAVENOUS | Status: DC

## 2018-10-06 MED ORDER — SODIUM CHLORIDE 0.9 % IV BOLUS
500.0000 mL | Freq: Once | INTRAVENOUS | Status: AC
  Administered 2018-10-06: 500 mL via INTRAVENOUS

## 2018-10-06 MED ORDER — LACTATED RINGERS IV SOLN
INTRAVENOUS | Status: DC | PRN
  Administered 2018-10-06: 15:00:00 via INTRAVENOUS

## 2018-10-06 MED ORDER — SUCCINYLCHOLINE CHLORIDE 200 MG/10ML IV SOSY
PREFILLED_SYRINGE | INTRAVENOUS | Status: DC | PRN
  Administered 2018-10-06: 100 mg via INTRAVENOUS

## 2018-10-06 MED ORDER — ONDANSETRON HCL 4 MG/2ML IJ SOLN
INTRAMUSCULAR | Status: DC | PRN
  Administered 2018-10-06: 4 mg via INTRAVENOUS

## 2018-10-06 MED ORDER — SODIUM CHLORIDE (PF) 0.9 % IJ SOLN
PREFILLED_SYRINGE | INTRAMUSCULAR | Status: DC | PRN
  Administered 2018-10-06: 4 mL

## 2018-10-06 MED ORDER — LIDOCAINE 2% (20 MG/ML) 5 ML SYRINGE
INTRAMUSCULAR | Status: DC | PRN
  Administered 2018-10-06: 100 mg via INTRAVENOUS

## 2018-10-06 NOTE — Progress Notes (Signed)
I have been made aware of this patient's brisk GI bleeding and vagal episode. Spoke with Dr. Avon Gully of [facility].  Patient will be transferred to PICU, given 2 units FFP stat and 1 unit PRBCs.  Will need emergency EGD later this afternoon.  Endoscopy team aware, anesthesia will be notified to request support and probable intubation for procedure.  Further plans to follow.

## 2018-10-06 NOTE — Anesthesia Postprocedure Evaluation (Signed)
Anesthesia Post Note  [name]  Procedure(s) Performed: ESOPHAGOGASTRODUODENOSCOPY (EGD) (N/A ) SCLEROTHERAPY HOT HEMOSTASIS (ARGON PLASMA COAGULATION/BICAP) (N/A )     Patient location during evaluation: PACU Anesthesia Type: General Level of consciousness: awake and alert Pain management: pain level controlled Vital Signs Assessment: post-procedure vital signs reviewed and stable Respiratory status: spontaneous breathing, nonlabored ventilation, respiratory function stable and patient connected to nasal cannula oxygen Cardiovascular status: blood pressure returned to baseline and stable Postop Assessment: no apparent nausea or vomiting Anesthetic complications: no    Last Vitals:  Vitals:   10/06/18 1642 10/06/18 1703  BP: (!) 172/63 (!) 143/68  Pulse: 93 94  Resp: (!) 22   Temp:  36.9 C  SpO2: 97% (!) 89%    Last Pain:  Vitals:   10/06/18 1703  TempSrc: Axillary  PainSc:                  Catalina Gravel

## 2018-10-06 NOTE — H&P (View-Only) (Signed)
I have been made aware of this patient's brisk GI bleeding and vagal episode. Spoke with Dr. Avon Gully of [facility].  Patient will be transferred to PICU, given 2 units FFP stat and 1 unit PRBCs.  Will need emergency EGD later this afternoon.  Endoscopy team aware, anesthesia will be notified to request support and probable intubation for procedure.  Further plans to follow.

## 2018-10-06 NOTE — Progress Notes (Signed)
Shirley [name] is a 78 y.o. female patient admitted from ED awake, alert - oriented  X 4 - no acute distress noted.  VSS - Blood pressure (!) 143/68, pulse 94, temperature 98.4 F (36.9 C), temperature source Axillary, resp. rate (!) 22, height 5\' 3"  (1.6 m), weight 119.3 kg, SpO2 96 %.    IV in place, occlusive dsg intact without redness.  Orientation to room, and floor completed with information packet given to patient/family.  Patient declined safety video at this time.  Admission INP armband ID verified with patient/family, and in place.   SR up x 2, fall assessment complete, with patient and family able to verbalize understanding of risk associated with falls, and verbalized understanding to call nsg before up out of bed.  Call light within reach, patient able to voice, and demonstrate understanding.  Skin, clean-dry- intact without evidence of bruising, or skin tears.   No evidence of skin break down noted on exam.     Will cont to eval and treat per MD orders.  Tama High, RN 10/06/2018 5:32 PM

## 2018-10-06 NOTE — Progress Notes (Signed)
This  Rn was called in the pt's. Room. NT was doing morning care, pt is sitting on the chair  by the sink. Patient is vomiting bright red blood, cold clammy, weak, almost passing out. vs checked ( see flowsheet). Pt verbalized I just feel dizzy. RR Rn called.  MD paged made aware, came to see pt.

## 2018-10-06 NOTE — Interval H&P Note (Signed)
History and Physical Interval Note:  10/06/2018 3:21 PM  Shirley [name]  has presented today for surgery, with the diagnosis of melena.  The various methods of treatment have been discussed with the patient and family. After consideration of risks, benefits and other options for treatment, the patient has consented to  Procedure(s): ESOPHAGOGASTRODUODENOSCOPY (EGD) (N/A) as a surgical intervention.  The patient's history has been reviewed, patient examined, no change in status, stable for surgery.  I have reviewed the patient's chart and labs.  Questions were answered to the patient's satisfaction.    Patient alert and conversational.Breathing comfortably.  BP /pulse have normalized. Abdomen soft,mild epigastric TTP as before  Anesthesia has evaluate patient, 2 Units FFP have just finished and one unit PRBCs is being prepped to run during EGD.  Procedure BARS reviewed again, consent signed.  Nelida Meuse III

## 2018-10-06 NOTE — Anesthesia Preprocedure Evaluation (Signed)
Anesthesia Evaluation  Patient identified by MRN, date of birth, ID band Patient awake    Reviewed: Allergy & Precautions, NPO status , Patient's Chart, lab work & pertinent test results, reviewed documented beta blocker date and time   Airway Mallampati: II  TM Distance: >3 FB Neck ROM: Full    Dental  (+) Dental Advisory Given, Upper Dentures   Pulmonary sleep apnea ,    Pulmonary exam normal breath sounds clear to auscultation       Cardiovascular hypertension, Pt. on home beta blockers and Pt. on medications + CAD, + Past MI and + Cardiac Stents (RCA)  Normal cardiovascular exam Rhythm:Regular Rate:Normal     Neuro/Psych negative neurological ROS     GI/Hepatic Neg liver ROS, GERD  ,  Endo/Other  Morbid obesity  Renal/GU negative Renal ROS     Musculoskeletal negative musculoskeletal ROS (+)   Abdominal   Peds  Hematology  (+) Blood dyscrasia (Eliquis), anemia ,   Anesthesia Other Findings Day of surgery medications reviewed with the patient.  Reproductive/Obstetrics                             Anesthesia Physical Anesthesia Plan  ASA: III and emergent  Anesthesia Plan: General   Post-op Pain Management:    Induction: Intravenous and Rapid sequence  PONV Risk Score and Plan: 3 and Dexamethasone and Ondansetron  Airway Management Planned: Oral ETT  Additional Equipment:   Intra-op Plan:   Post-operative Plan: Extubation in OR  Informed Consent: I have reviewed the patients History and Physical, chart, labs and discussed the procedure including the risks, benefits and alternatives for the proposed anesthesia with the patient or authorized representative who has indicated his/her understanding and acceptance.     Dental advisory given  Plan Discussed with: CRNA  Anesthesia Plan Comments:         Anesthesia Quick Evaluation

## 2018-10-06 NOTE — Progress Notes (Addendum)
Patient transported to Endo for EGD. Pt will be transferred to [id number], family aware. Report given to [facility].Belongingssent to 5W, cp with charger, cane,eyeglasses and slippers.

## 2018-10-06 NOTE — Op Note (Signed)
[facility] Patient Name: Shirley [name] Procedure Date : 10/06/2018 MRN: [id number] Attending [name]: Shirley [name] , [name] Date of Birth: 1940-04-07 CSN: [id number] Age: 78 Admit Type: Inpatient Procedure:                Upper GI endoscopy Indications:              Acute post hemorrhagic anemia, Hematemesis, Melena                            (on aspirin for CAD and coumadin for DVT - Vitamin                            K, FFP and PRBCs given before procedure) Providers:                Shirley [name], Shirley Ramp. Tilden Dome, RN, Shirley [name], Technician Referring [name]:             [facility] (Dr. Avon Gully) Medicines:                Monitored Anesthesia Care Complications:            No immediate complications. Estimated Blood Loss:     Estimated blood loss was minimal. Procedure:                Pre-Anesthesia Assessment:                           - Prior to the procedure, a History and Physical                            was performed, and patient medications and                            allergies were reviewed. The patient's tolerance of                            previous anesthesia was also reviewed. The risks                            and benefits of the procedure and the sedation                            options and risks were discussed with the patient.                            All questions were answered, and informed consent                            was obtained. Prior Anticoagulants: The patient has                            taken Coumadin (warfarin), last dose was 2 days  prior to procedure. ASA Grade Assessment: IV - A                            patient with severe systemic disease that is a                            constant threat to life. After reviewing the risks                            and benefits, the patient was deemed in                            satisfactory condition to undergo the  procedure.                           After obtaining informed consent, the endoscope was                            passed under direct vision. Throughout the                            procedure, the patient's blood pressure, pulse, and                            oxygen saturations were monitored continuously. The                            GIF-H190 ([id number]) Olympus gastroscope was                            introduced through the mouth, and advanced to the                            second part of duodenum. The upper GI endoscopy was                            accomplished without difficulty. The patient                            tolerated the procedure well. Scope In: Scope Out: Findings:      The esophagus was normal.      A clarge amount of fresh and clotted blood was found in the gastric       fundus and in the gastric body.      One oozing cratered gastric ulcer with a visible vessel was found at the       incisura. The lesion was 15 mm in largest dimension. Area was       successfully injected with 4 mL of a 1:10,000 solution of epinephrine       for hemostasis. Coagulation for hemostasis using bipolar probe was       successful.      The cardia and gastric fundus were normal on retroflexion (given limited       visualization from retained blood).      The examined duodenum was normal.  Impression:               - Normal esophagus.                           - Red blood in the gastric fundus and in the                            gastric body.                           - Oozing gastric ulcer with a visible vessel.                            Injected. Treated with bipolar cautery.                           - Normal examined duodenum.                           - No specimens collected. Moderate Sedation:      GETA (intubated for airway protection) Recommendation:           - Return patient to hospital ward for ongoing care.                           - Full liquid diet.                            - Serial Hgb/Hct and Tfx for Hgb < 7.0                           Remain off coumadin and aspirin - eventual plan for                            those meds TBD based on patient's clinical progress                            and repeat outpatient EGD.                           (we will arrange follow-up with Dr. Carlean Purl after                            discharge)                           Serum H pylori antibody (Bx not taken due to                            bleeding risk after FFP effect wears off)                           Protonix drip next 48 hours.Then can transition to  twice daily oral if bleeding does not recur. Procedure Code(s):        --- Professional ---                           [phone], Esophagogastroduodenoscopy, flexible,                            transoral; with control of bleeding, any method Diagnosis Code(s):        --- Professional ---                           K92.2, Gastrointestinal hemorrhage, unspecified                           K25.4, Chronic or unspecified gastric ulcer with                            hemorrhage                           D62, Acute posthemorrhagic anemia                           K92.0, Hematemesis                           K92.1, Melena (includes Hematochezia) CPT copyright 2019 American Medical Association. All rights reserved. The codes documented in this report are preliminary and upon coder review may  be revised to meet current compliance requirements. Shirley [name] L. Shirley Carrow, [name] 10/06/2018 4:30:41 PM This report has been signed electronically. Number of Addenda: 0

## 2018-10-06 NOTE — Progress Notes (Signed)
28 Spoke with daughter Cynthia,gave updates.

## 2018-10-06 NOTE — Progress Notes (Addendum)
PROGRESS NOTE    Shirley [name]  MBW:[id number] DOB: 1940/09/15 DOA: 10/04/2018 PCP: Merrilee Seashore, MD  Brief Narrative:  Shirley [name] is a 78 y.o. female with medical history significant of CAD status post PCI, DVT on Coumadin, GERD, hypertension, hyperlipidemia, CVA presenting to the hospital for evaluation of emesis and near syncope.  Patient states after eating dinner last night she did not feel good.  It felt like she was going to pass out so she sat down on a couch.  Soon after she had an episode of very dark brown vomit.  Subsequently had an episode of very dark stool.  Denies any abdominal pain.  Denies history of prior GI bleed.  States she takes a baby aspirin daily and Coumadin.  She has not been able to go for INR checks since March due to the current COVID pandemic.  Denies any change in her Coumadin dose.  Denies over-the-counter NSAID use.  Denies tobacco or alcohol use.  In the ED patient was not noted to be tachycardic or hypotensive.  FOBT positive. Melanotic stool on exam.  White count 12.2.  Hemoglobin 12.3.  Lipase and LFTs normal.  UA with trace amount of leukocytes, 11-20 WBCs, rare bacteria, and negative nitrite.  High-sensitivity troponin negative.  EKG not suggestive of ACS.  500 cc IV fluid bolus ordered.  Assessment & Plan:   Principal Problem:   GI bleed Active Problems:   DVT (deep venous thrombosis) (HCC)   CAD in native artery, stent to RCA BMS with MI 2015   Asymptomatic bacteriuria   History of CVA (cerebrovascular accident)   Acute upper GI bleed   Acute, now symptomatic, anemia likely in the setting of suspected upper GI bleed POA Presenting with complaints of acute onset coffee-ground emesis and melena - FOBT positive Patient continues to have melanotic stool overnight Hemoglobin continues to downtrend currently 7.9, admitted at 12.3 EGD done in 2012 showing a 2 cm hiatal hernia -otherwise unremarkable GI following - appreciate insight  and recommendations -EGD pending for 10/07/2018 INR continues to be somewhat elevated, GI has prescribed additional dosing of vitamin K Continue PPI IV drip supportive care with zofran, phenergan as needed   **Addendum** Late this morning patient was standing at the sink and had a large volume bright red hematemesis event with near syncope rapid response was called, GI called back, recommended 2 unit PRBC, 2 unit FFP and was scheduled for upper endoscopy later today**  Abnormal UA; UTI RULED OUT UA with trace amount of leukocytes, 11-20 WBCs, rare bacteria, and negative nitrite.   Patient is not endorsing any UTI symptoms.   Antibiotics not currently indicated per IDSA guidelines  CAD status post PCI Stable, without symptoms.  High-sensitivity troponin negative and EKG not suggestive of ACS. Hold home aspirin in the setting of acute GI bleed  History of DVT  Hold home Coumadin in the setting of acute GI bleed  CVA Hold home aspirin in the setting of acute GI bleed  DVT prophylaxis: SCDs Code Status: Patient wishes to be full code. Family Communication: No family available. Disposition Plan:  Remain as inpatient, patient continues to have melanotic stool in the setting of elevated INR, will need an additional 24 to 48 hours to safely evaluate, reduce INR so that patient is able to proceed with EGD further evaluate location of likely ongoing GI bleed Consults called: GI - [facility]: Dr. Loletha Carrow Procedures:   EGD planned 10/07/18  Subjective: No acute issues or events overnight,  patient declines chest pain, shortness of breath, nausea, vomiting, diarrhea, constipation, headache, fevers, chills.  Objective: Vitals:   10/05/18 2009 10/06/18 0123 10/06/18 0405 10/06/18 0557  BP: (!) 168/75 134/67 (!) 145/67   Pulse: 78 88 85   Resp: 20 20 20    Temp: 98.5 F (36.9 C) 98.4 F (36.9 C) 98.3 F (36.8 C)   TempSrc: Oral Oral Oral   SpO2: 99% 100% 99%   Weight:    119.3 kg  Height:         Intake/Output Summary (Last 24 hours) at 10/06/2018 1104 Last data filed at 10/06/2018 1056 Gross per 24 hour  Intake 651.83 ml  Output 1400 ml  Net -748.17 ml   Filed Weights   10/05/18 0609 10/06/18 0557  Weight: 119.8 kg 119.3 kg    Physical examination: General:  Pleasantly resting in bed, No acute distress. HEENT:  Normocephalic atraumatic.  Sclerae nonicteric, noninjected.  Extraocular movements intact bilaterally. Neck:  Without mass or deformity.  Trachea is midline. Lungs:  Clear to auscultate bilaterally without rhonchi, wheeze, or rales. Heart:  Regular rate and rhythm.  Without murmurs, rubs, or gallops. Abdomen:  Soft, minimal epigastric tenderness, nondistended.  Without guarding or rebound. Extremities: Without cyanosis, clubbing, edema, or obvious deformity. Vascular:  Dorsalis pedis and posterior tibial pulses palpable bilaterally. Skin:  Warm and dry, no erythema, no ulcerations.  Data Reviewed: I have personally reviewed following labs and imaging studies  CBC: Recent Labs  Lab 10/04/18 2340 10/05/18 1011 10/05/18 1845 10/06/18 0258  WBC 12.2*  --   --   --   HGB 12.3 9.6* 9.0* 7.9*  HCT 37.9 29.3* 28.0* 24.1*  MCV 89.0  --   --   --   PLT 237  --   --   --    Basic Metabolic Panel: Recent Labs  Lab 10/04/18 2340  NA 134*  K 3.8  CL 101  CO2 25  GLUCOSE 157*  BUN 32*  CREATININE 0.65  CALCIUM 8.8*   GFR: Estimated Creatinine Clearance: 72.5 mL/min (by C-G formula based on SCr of 0.65 mg/dL). Liver Function Tests: Recent Labs  Lab 10/04/18 2340  AST 20  ALT 23  ALKPHOS 84  BILITOT 0.6  PROT 6.0*  ALBUMIN 3.0*   Recent Labs  Lab 10/04/18 2340  LIPASE 24   No results for input(s): AMMONIA in the last 168 hours. Coagulation Profile: Recent Labs  Lab 10/05/18 0525 10/06/18 0258  INR 1.8* 2.1*   Cardiac Enzymes: No results for input(s): CKTOTAL, CKMB, CKMBINDEX, TROPONINI in the last 168 hours. BNP (last 3 results) No  results for input(s): PROBNP in the last 8760 hours. HbA1C: No results for input(s): HGBA1C in the last 72 hours. CBG: No results for input(s): GLUCAP in the last 168 hours. Lipid Profile: No results for input(s): CHOL, HDL, LDLCALC, TRIG, CHOLHDL, LDLDIRECT in the last 72 hours. Thyroid Function Tests: No results for input(s): TSH, T4TOTAL, FREET4, T3FREE, THYROIDAB in the last 72 hours. Anemia Panel: No results for input(s): VITAMINB12, FOLATE, FERRITIN, TIBC, IRON, RETICCTPCT in the last 72 hours. Sepsis Labs: No results for input(s): PROCALCITON, LATICACIDVEN in the last 168 hours.  Recent Results (from the past 240 hour(s))  SARS Coronavirus 2 (CEPHEID - Performed in [place] hospital lab), Hosp Order     Status: None   Collection Time: 10/05/18  4:51 AM   Specimen: Nasopharyngeal Swab  Result Value Ref Range Status   SARS Coronavirus 2 NEGATIVE NEGATIVE Final  Comment: (NOTE) If result is NEGATIVE SARS-CoV-2 target nucleic acids are NOT DETECTED. The SARS-CoV-2 RNA is generally detectable in upper and lower  respiratory specimens during the acute phase of infection. The lowest  concentration of SARS-CoV-2 viral copies this assay can detect is 250  copies / mL. A negative result does not preclude SARS-CoV-2 infection  and should not be used as the sole basis for treatment or other  patient management decisions.  A negative result may occur with  improper specimen collection / handling, submission of specimen other  than nasopharyngeal swab, presence of viral mutation(s) within the  areas targeted by this assay, and inadequate number of viral copies  (<250 copies / mL). A negative result must be combined with clinical  observations, patient history, and epidemiological information. If result is POSITIVE SARS-CoV-2 target nucleic acids are DETECTED. The SARS-CoV-2 RNA is generally detectable in upper and lower  respiratory specimens dur ing the acute phase of infection.   Positive  results are indicative of active infection with SARS-CoV-2.  Clinical  correlation with patient history and other diagnostic information is  necessary to determine patient infection status.  Positive results do  not rule out bacterial infection or co-infection with other viruses. If result is PRESUMPTIVE POSTIVE SARS-CoV-2 nucleic acids MAY BE PRESENT.   A presumptive positive result was obtained on the submitted specimen  and confirmed on repeat testing.  While 2019 novel coronavirus  (SARS-CoV-2) nucleic acids may be present in the submitted sample  additional confirmatory testing may be necessary for epidemiological  and / or clinical management purposes  to differentiate between  SARS-CoV-2 and other Sarbecovirus currently known to infect humans.  If clinically indicated additional testing with an alternate test  methodology [phone]) is advised. The SARS-CoV-2 RNA is generally  detectable in upper and lower respiratory sp ecimens during the acute  phase of infection. The expected result is Negative. Fact Sheet for Patients:  StrictlyIdeas.no Fact Sheet for Healthcare Providers: BankingDealers.co.za This test is not yet approved or cleared by the [place] FDA and has been authorized for detection and/or diagnosis of SARS-CoV-2 by FDA under an Emergency Use Authorization (EUA).  This EUA will remain in effect (meaning this test can be used) for the duration of the COVID-19 declaration under Section 564(b)(1) of the Act, 21 U.S.C. section 360bbb-3(b)(1), unless the authorization is terminated or revoked sooner. Performed at [facility], [place] [address] [place], [place] [address]     Radiology Studies: No results found.  Scheduled Meds: . [START ON 10/08/2018] pantoprazole  40 mg Intravenous Q12H  . sodium chloride flush  3 mL Intravenous Once   Continuous Infusions: . sodium chloride 20 mL/hr at 10/05/18 1825   . pantoprozole (PROTONIX) infusion 8 mg/hr (10/06/18 0416)    LOS: 1 day   Time spent: greater than 35 minutes  Little Ishikawa, DO [facility]  If 7PM-7AM, please contact night-coverage [email or web address] Password [email or web address] 10/06/2018, 11:04 AM

## 2018-10-06 NOTE — Progress Notes (Signed)
Spoke with pts. daughter Nevin Bloodgood and notified her that pt has made it to the unit. Told her that pt is resting in bed, full liquid tray ordered. I gave her pts. room number and she stated she will let her mother rest and call her later on.

## 2018-10-06 NOTE — Transfer of Care (Signed)
Immediate Anesthesia Transfer of Care Note  [name]  Procedure(s) Performed: ESOPHAGOGASTRODUODENOSCOPY (EGD) (N/A ) SCLEROTHERAPY HOT HEMOSTASIS (ARGON PLASMA COAGULATION/BICAP) (N/A )  Patient Location: Endoscopy Unit  Anesthesia Type:General  Level of Consciousness: patient cooperative  Airway & Oxygen Therapy: Patient Spontanous Breathing and Patient connected to face mask oxygen  Post-op Assessment: Report given to RN and Post -op Vital signs reviewed and stable  Post vital signs: Reviewed and stable  Last Vitals:  Vitals Value Taken Time  BP 150/72 10/06/18 1611  Temp 36.8 C 10/06/18 1611  Pulse 94 10/06/18 1613  Resp 22 10/06/18 1613  SpO2 99 % 10/06/18 1613  Vitals shown include unvalidated device data.  Last Pain:  Vitals:   10/06/18 1611  TempSrc: Temporal  PainSc: 0-No pain      Patients Stated Pain Goal: 0 (28/36/62 9476)  Complications: No apparent anesthesia complications

## 2018-10-06 NOTE — Progress Notes (Signed)
RN called for pt having an episode of bloody emesis, pt now feeling weak, cool and clammy. On arrival I was sent to another pot's room on same floor. MD at bedside, no interventions from RRT. Suggested transfer to progressive for continuous monitoring. Pt to receive 1-2 units PRBC's

## 2018-10-06 NOTE — Anesthesia Procedure Notes (Signed)
Procedure Name: Intubation Date/Time: 10/06/2018 3:34 PM Performed by: Shirlyn Goltz, CRNA Pre-anesthesia Checklist: Patient identified, Emergency Drugs available, Suction available and Patient being monitored Patient Re-evaluated:Patient Re-evaluated prior to induction Oxygen Delivery Method: Circle system utilized Preoxygenation: Pre-oxygenation with 100% oxygen Induction Type: IV induction, Rapid sequence and Cricoid Pressure applied Laryngoscope Size: Mac and 4 Grade View: Grade I Tube type: Oral Tube size: 7.5 mm Number of attempts: 1 Airway Equipment and Method: Stylet Placement Confirmation: ETT inserted through vocal cords under direct vision,  positive ETCO2 and breath sounds checked- equal and bilateral Secured at: 20 cm Tube secured with: Tape Dental Injury: Teeth and Oropharynx as per pre-operative assessment

## 2018-10-06 NOTE — Progress Notes (Addendum)
[facility] Progress Note  Chief Complaint: Melena  History:  She has had no hematemesis since prior to admission.  She reports having few passages of melena until about midday yesterday, then no bowel movement since.  Hemoglobin has continued to drop, and has not yet required transfusion.  Plans for an upper endoscopy were canceled earlier this morning when her INR remained elevated at 2.1 despite oral administration of 2.5 mg vitamin K yesterday.  She still has epigastric discomfort and nausea.  ROS: Cardiovascular: No chest pain Respiratory: No dyspnea Urinary: No dysuria  Objective:   Current Facility-Administered Medications:  .  0.9 %  sodium chloride infusion, , Intravenous, Continuous, Danis, Estill Cotta III, MD, Last Rate: 20 mL/hr at 10/05/18 1825 .  acetaminophen (TYLENOL) tablet 650 mg, 650 mg, Oral, Q6H PRN **OR** acetaminophen (TYLENOL) suppository 650 mg, 650 mg, Rectal, Q6H PRN, Shela Leff, MD .  ondansetron (ZOFRAN) injection 4 mg, 4 mg, Intravenous, Q6H PRN, Shela Leff, MD .  pantoprazole (PROTONIX) 80 mg in sodium chloride 0.9 % 250 mL (0.32 mg/mL) infusion, 8 mg/hr, Intravenous, Continuous, Rathore, Wandra Feinstein, MD, Last Rate: 25 mL/hr at 10/06/18 0416, 8 mg/hr at 10/06/18 0416 .  [START ON 10/08/2018] pantoprazole (PROTONIX) injection 40 mg, 40 mg, Intravenous, Q12H, Rathore, Vasundhra, MD .  sodium chloride flush (NS) 0.9 % injection 3 mL, 3 mL, Intravenous, Once, Shela Leff, MD  . sodium chloride 20 mL/hr at 10/05/18 1825  . pantoprozole (PROTONIX) infusion 8 mg/hr (10/06/18 0416)     Vital signs in last 24 hrs: Vitals:   10/06/18 0123 10/06/18 0405  BP: 134/67 (!) 145/67  Pulse: 88 85  Resp: 20 20  Temp: 98.4 F (36.9 C) 98.3 F (36.8 C)  SpO2: 100% 99%    Intake/Output Summary (Last 24 hours) at 10/06/2018 0747 Last data filed at 10/06/2018 4665 Gross per 24 hour  Intake 531.83 ml  Output 1400 ml  Net -868.17 ml     Physical  Exam Radial pulse in the 80s  HEENT: sclera anicteric, oral mucosa without lesions  Neck: supple, no thyromegaly, JVD or lymphadenopathy  Cardiac: RRR without murmurs, S1S2 heard, no peripheral edema  Pulm: clear to auscultation bilaterally, normal RR and effort noted  Abdomen: soft, morbidly obese, mild epigastric tenderness, with active bowel sounds. No guarding or palpable hepatosplenomegaly, though limited by body habitus  Skin; warm and dry, no jaundice  Recent Labs:  CBC Latest Ref Rng & Units 10/06/2018 10/05/2018 10/05/2018  WBC 4.0 - 10.5 K/uL - - -  Hemoglobin 12.0 - 15.0 g/dL 7.9(L) 9.0(L) 9.6(L)  Hematocrit 36.0 - 46.0 % 24.1(L) 28.0(L) 29.3(L)  Platelets 150 - 400 K/uL - - -    Recent Labs  Lab 10/06/18 0258  INR 2.1*   CMP Latest Ref Rng & Units 10/04/2018 10/18/2017 01/05/2015  Glucose 70 - 99 mg/dL 157(H) 131(H) 100(H)  BUN 8 - 23 mg/dL 32(H) 11 18  Creatinine 0.44 - 1.00 mg/dL 0.65 0.68 0.70  Sodium 135 - 145 mmol/L 134(L) 132(L) 133(L)  Potassium 3.5 - 5.1 mmol/L 3.8 4.2 5.4(H)  Chloride 98 - 111 mmol/L 101 95(L) 99(L)  CO2 22 - 32 mmol/L 25 23 -  Calcium 8.9 - 10.3 mg/dL 8.8(L) 8.9 -  Total Protein 6.5 - 8.1 g/dL 6.0(L) - -  Total Bilirubin 0.3 - 1.2 mg/dL 0.6 - -  Alkaline Phos 38 - 126 U/L 84 - -  AST 15 - 41 U/L 20 - -  ALT 0 - 44 U/L  23 - -    _0 @ Assessment: Melena Acute blood loss anemia Epigastric pain Nausea and vomiting   Hemoglobin steadily decreased since admission, though she has had no further overt bleeding yesterday, and BUN has normalized.  Difficult to be certain - may still be bleeding. She still needs upper endoscopy, but it would be most safely and effectively done with reversal of the coagulopathy.It is emergent and needs to be done over the weekend.  Plan: Remain on clear liquids today as well as Protonix drip Vitamin K 5 mg oral x1 ordered, and discussed this with nursing to make sure she received  it. Tentative plan for upper endoscopy tomorrow with MAC sedation with INR check in the morning. Every 8 hour hemoglobin and hematocrit check x24 hours, transfuse for hemoglobin below 7.  Total time 25 minutes Nelida Meuse III Office: [phone]

## 2018-10-07 ENCOUNTER — Encounter (HOSPITAL_COMMUNITY): Payer: Self-pay | Admitting: Gastroenterology

## 2018-10-07 ENCOUNTER — Inpatient Hospital Stay (HOSPITAL_COMMUNITY): Payer: Medicare Other

## 2018-10-07 DIAGNOSIS — D689 Coagulation defect, unspecified: Secondary | ICD-10-CM

## 2018-10-07 DIAGNOSIS — K921 Melena: Secondary | ICD-10-CM

## 2018-10-07 DIAGNOSIS — D62 Acute posthemorrhagic anemia: Secondary | ICD-10-CM

## 2018-10-07 LAB — TYPE AND SCREEN
ABO/RH(D): A POS
Antibody Screen: NEGATIVE
Unit division: 0
Unit division: 0

## 2018-10-07 LAB — BPAM FFP
Blood Product Expiration Date: 202007072359
Blood Product Expiration Date: 202007052359
ISSUE DATE / TIME: 202007031443
ISSUE DATE / TIME: 202007031351
Unit Type and Rh: 6200
Unit Type and Rh: 6200

## 2018-10-07 LAB — BPAM RBC
Blood Product Expiration Date: 202007232359
Blood Product Expiration Date: 202007242359
ISSUE DATE / TIME: 202007031438
ISSUE DATE / TIME: 202007031532
Unit Type and Rh: 6200
Unit Type and Rh: 6200

## 2018-10-07 LAB — PREPARE FRESH FROZEN PLASMA
Unit division: 0
Unit division: 0

## 2018-10-07 LAB — CBC
HCT: 26 % — ABNORMAL LOW (ref 36.0–46.0)
Hemoglobin: 8.7 g/dL — ABNORMAL LOW (ref 12.0–15.0)
MCH: 29.4 pg (ref 26.0–34.0)
MCHC: 33.5 g/dL (ref 30.0–36.0)
MCV: 87.8 fL (ref 80.0–100.0)
Platelets: 195 10*3/uL (ref 150–400)
RBC: 2.96 MIL/uL — ABNORMAL LOW (ref 3.87–5.11)
RDW: 14.4 % (ref 11.5–15.5)
WBC: 14.4 10*3/uL — ABNORMAL HIGH (ref 4.0–10.5)
nRBC: 0.3 % — ABNORMAL HIGH (ref 0.0–0.2)

## 2018-10-07 LAB — HEMOGLOBIN AND HEMATOCRIT, BLOOD
HCT: 26.6 % — ABNORMAL LOW (ref 36.0–46.0)
Hemoglobin: 8.9 g/dL — ABNORMAL LOW (ref 12.0–15.0)

## 2018-10-07 LAB — PROTIME-INR
INR: 1.4 — ABNORMAL HIGH (ref 0.8–1.2)
Prothrombin Time: 17 seconds — ABNORMAL HIGH (ref 11.4–15.2)

## 2018-10-07 LAB — TROPONIN I (HIGH SENSITIVITY)
Troponin I (High Sensitivity): 7 ng/L (ref <18)
Troponin I (High Sensitivity): 9 ng/L (ref <18)

## 2018-10-07 MED ORDER — POLYVINYL ALCOHOL 1.4 % OP SOLN
1.0000 [drp] | OPHTHALMIC | Status: DC | PRN
  Administered 2018-10-08: 1 [drp] via OPHTHALMIC
  Filled 2018-10-07: qty 15

## 2018-10-07 MED ORDER — HYDROCORTISONE 1 % EX CREA
TOPICAL_CREAM | Freq: Three times a day (TID) | CUTANEOUS | Status: DC | PRN
  Filled 2018-10-07: qty 28

## 2018-10-07 MED ORDER — ALUM & MAG HYDROXIDE-SIMETH 200-200-20 MG/5ML PO SUSP
30.0000 mL | Freq: Once | ORAL | Status: AC
  Administered 2018-10-07: 30 mL via ORAL
  Filled 2018-10-07: qty 30

## 2018-10-07 MED ORDER — METHYLPREDNISOLONE SODIUM SUCC 125 MG IJ SOLR
125.0000 mg | Freq: Once | INTRAMUSCULAR | Status: AC
  Administered 2018-10-08: 125 mg via INTRAVENOUS

## 2018-10-07 MED ORDER — DIPHENHYDRAMINE HCL 50 MG/ML IJ SOLN
12.5000 mg | Freq: Once | INTRAMUSCULAR | Status: AC
  Administered 2018-10-08: 12.5 mg via INTRAVENOUS
  Filled 2018-10-07: qty 1

## 2018-10-07 NOTE — Progress Notes (Addendum)
Patient ID: TEZRA MAHR, female   DOB: 08-29-1940, 78 y.o.   MRN: [id number]    Progress Note   Subjective   Chief complaint: Bleeding gastric ulcer  Day # 3 Hemoglobin last p.m. 9.3/pending this a.m. INR today 1.4 She has continued to pass melenic stool.  Patient says she feels okay but did have some lower chest pain radiating up into her chest earlier this morning which she describes as pressure-like. EKG was done, she just took a GI cocktail.  She says the pain is better but not completely gone.  She is also expressing concern about going back on Coumadin, and wants to know what her options are  EGD yesterday-large amount of blood in the gastric fundus and body, one oozing cratered gastric ulcer with visible vessel the incisura 10 mm in size injected with epi, and cauterized  Review of systems: The chest pain she was experiencing earlier this morning has resolved. She is bloated and gassy Breathing feels comfortable No nausea or vomiting Denies dysuria   Objective   Vital signs in last 24 hours: Temp:  [98 F (36.7 C)-99.3 F (37.4 C)] 98.4 F (36.9 C) (07/04 0540) Pulse Rate:  [73-103] 73 (07/04 0540) Resp:  [12-25] 16 (07/04 0540) BP: (109-172)/(38-96) 129/57 (07/04 0540) SpO2:  [96 %-100 %] 98 % (07/04 0540) Weight:  [119.3 kg] 119.3 kg (07/03 1407) Last BM Date: 10/06/18 General:    Older Hispanic female in NAD Heart:  Regular rate and rhythm; no murmurs Lungs: Respirations even and unlabored, lungs CTA bilaterally Abdomen:  Soft, minimally tender in the epigastrium nondistended. Normal bowel sounds.  Cannot assess mass or hepatosplenomegaly due to body habitus Extremities:  Without edema. Neurologic:  Alert and oriented,  grossly normal neurologically. Psych:  Cooperative. Normal mood and affect.  Intake/Output from previous day: 07/03 0701 - 07/04 0700 In: 0109 [P.O.:120; I.V.:200; Blood:1471] Out: -  Intake/Output this shift: No intake/output data  recorded.  Lab Results: Recent Labs    10/04/18 2340  10/06/18 0258 10/06/18 1203 10/06/18 2139  WBC 12.2*  --   --   --  13.9*  HGB 12.3   < > 7.9* 7.4* 9.3*  HCT 37.9   < > 24.1* 23.2* 27.2*  PLT 237  --   --   --  183   < > = values in this interval not displayed.   BMET Recent Labs    10/04/18 2340  NA 134*  K 3.8  CL 101  CO2 25  GLUCOSE 157*  BUN 32*  CREATININE 0.65  CALCIUM 8.8*   LFT Recent Labs    10/04/18 2340  PROT 6.0*  ALBUMIN 3.0*  AST 20  ALT 23  ALKPHOS 84  BILITOT 0.6   PT/INR Recent Labs    10/06/18 0258 10/07/18 0552  LABPROT 23.3* 17.0*  INR 2.1* 1.4*        Assessment / Plan:    #86 78 year old female with acute GI bleed in setting of chronic anticoagulation with Coumadin and aspirin.  Patient found to have an actively bleeding gastric ulcer on EGD yesterday which was treated with epi and BiCAP.  Hemoglobin improved last p.m., pending this a.m.  She has continued to pass some melenic stool - suspect this is old blood.  Continue PPI infusion Serial hemoglobins every 8 hours and transfuse for hemoglobin 7 or less Hold aspirin and Coumadin for now-Decision regarding options for long-term anticoagulation will need to be referred to cardiology Advance diet to full  liquid  She will need repeat EGD in 4-6 weeks as an outpatient to document healing  #2  Episode of chest pain this a.m.-defer to hospitalist, will check troponin and order chest x-ray #3  history of DVT/provoked- after knee surgery #4 history of CVA #5 coronary artery disease status post MI and PCI #6 obesity  Also acute blood loss anemia   Principal Problem:   GI bleed Active Problems:   DVT (deep venous thrombosis) (HCC)   CAD in native artery, stent to RCA BMS with MI 2015   Asymptomatic bacteriuria   History of CVA (cerebrovascular accident)   Acute upper GI bleed     LOS: 2 days   [name] PA-C 10/07/2018, 9:52 AM    I have discussed the case  with the PA, and that is the plan I formulated. I personally interviewed and examined the patient.  I believe the ulcer has stopped eating after endoscopic therapy yesterday.  It is most likely aspirin induced, but serum H. pylori antibody is pending.  I did not take gastric biopsies to look for H. pylori, because I was concerned about the risk of postprocedural bleeding from biopsy sites once the FFP effect wore off.  She has coronary artery disease with a prior MI and was on aspirin.  This needs to be held until the ulcer has healed. She is also been on Coumadin for history of recurrent DVT.  This Coumadin must also be held until the ulcer has healed.  She understands there is a risk of recurrent DVT and PE, but that is outweighed by the bleeding risk if she were to go back on Coumadin before the ulcer has healed.  I will advance her to regular diet.  We will check a CBC this evening and again this morning.  We will check on her tomorrow.  If she does not have any recurrent bleeding, she would likely be able to go home on twice daily PPI, and we would arrange outpatient follow-up for her.  Total time 25 minutes  Nelida Meuse III Office: [phone]  Spoke with son and gave diagnosis.  He is aware hospitalist will be calling him.   Wilfrid Lund, MD    [facility]

## 2018-10-07 NOTE — Progress Notes (Signed)
PROGRESS NOTE    Shirley [name]  YKZ:[id number] DOB: 03-03-1941 DOA: 10/04/2018 PCP: Merrilee Seashore, MD  Brief Narrative:  Shirley [name] is a 78 y.o. female with medical history significant of CAD status post PCI, DVT on Coumadin, GERD, hypertension, hyperlipidemia, CVA presenting to the hospital for evaluation of emesis and near syncope.  Patient states after eating dinner last night she did not feel good.  It felt like she was going to pass out so she sat down on a couch.  Soon after she had an episode of very dark brown vomit.  Subsequently had an episode of very dark stool.  Denies any abdominal pain.  Denies history of prior GI bleed.  States she takes a baby aspirin daily and Coumadin.  She has not been able to go for INR checks since March due to the current COVID pandemic.  Denies any change in her Coumadin dose.  Denies over-the-counter NSAID use.  Denies tobacco or alcohol use.  In the ED patient was not noted to be tachycardic or hypotensive.  FOBT positive. Melanotic stool on exam.  White count 12.2.  Hemoglobin 12.3.  Lipase and LFTs normal.  UA with trace amount of leukocytes, 11-20 WBCs, rare bacteria, and negative nitrite.  High-sensitivity troponin negative.  EKG not suggestive of ACS.  500 cc IV fluid bolus ordered.  Assessment & Plan:   Principal Problem:   GI bleed Active Problems:   DVT (deep venous thrombosis) (HCC)   CAD in native artery, stent to RCA BMS with MI 2015   Asymptomatic bacteriuria   History of CVA (cerebrovascular accident)   Acute upper GI bleed   Acute, now symptomatic, anemia likely in the setting of suspected upper GI bleed POA Presenting with complaints of acute onset coffee-ground emesis and melena - FOBT positive Patient continues to have melanotic stool overnight although small quantity - likely residual blood Hemoglobin 8.9; 9.3 yesterday after 2 units PRBCs/2 units FFP EGD 10/06/18: Large amount of blood, oozing cratered gastric  ulcer = 3mm in size; cautery/epi INR 1.4 Will likely need lengthy discussion with PCP/Cardiology in the outpatient setting about whether or not to resume coumadin given her history and this new bleeding gastric ulcer  Atypical chest pain Began overnight - pleuritic/low rib/upper abdominal in location Improving with GI cocktail Worse with deep inspiration Troponin WNL - EKG without acute changes. No further workup indicated - likely GI/Pleuritic in nature.  Abnormal UA; UTI RULED OUT UA with trace amount of leukocytes, 11-20 WBCs, rare bacteria, and negative nitrite.   Patient is not endorsing any UTI symptoms.   Antibiotics not currently indicated per IDSA guidelines  CAD status post PCI Stable, without symptoms.  High-sensitivity troponin negative and EKG not suggestive of ACS. Hold home aspirin in the setting of acute GI bleed  History of DVT  Hold home Coumadin in the setting of acute GI bleed  CVA Hold home aspirin in the setting of acute GI bleed  DVT prophylaxis: SCDs Code Status: Patient wishes to be full code. Family Communication: No family available. Disposition Plan:  Remain as inpatient, s/p endoscopy with cautery/epi for bleeding gastric ulcer - monitor for an additional 24-48h to ensure no ongoing bleeding or worsening symptoms given previous events and somewhat downtrending hemoglobin.  Consults called: GI - [facility]: Dr. Loletha Carrow Procedures:   EGD planned 10/07/18  Subjective: No acute issues or events overnight, patient declines chest pain, shortness of breath, nausea, vomiting, diarrhea, constipation, headache, fevers, chills.  Objective: Vitals:  10/06/18 1642 10/06/18 1703 10/06/18 2209 10/07/18 0540  BP: (!) 172/63 (!) 143/68 (!) 151/73 (!) 129/57  Pulse: 93 94 96 73  Resp: (!) 22  16 16   Temp:  98.4 F (36.9 C) 98.1 F (36.7 C) 98.4 F (36.9 C)  TempSrc:  Axillary    SpO2: 97% 96% 98% 98%  Weight:      Height:        Intake/Output Summary  (Last 24 hours) at 10/07/2018 1359 Last data filed at 10/06/2018 1612 Gross per 24 hour  Intake 1670.98 ml  Output -  Net 1670.98 ml   Filed Weights   10/05/18 0609 10/06/18 0557 10/06/18 1407  Weight: 119.8 kg 119.3 kg 119.3 kg    Physical examination: General:  Pleasantly resting in bed, No acute distress. HEENT:  Normocephalic atraumatic.  Sclerae nonicteric, noninjected.  Extraocular movements intact bilaterally. Neck:  Without mass or deformity.  Trachea is midline. Lungs:  Clear to auscultate bilaterally without rhonchi, wheeze, or rales. Heart:  Regular rate and rhythm.  Without murmurs, rubs, or gallops. Abdomen:  Soft, minimal upper abdomen tenderness with palpation, nondistended.  Without guarding or rebound. Extremities: Without cyanosis, clubbing, edema, or obvious deformity. Vascular:  Dorsalis pedis and posterior tibial pulses palpable bilaterally. Skin:  Warm and dry, no erythema, no ulcerations.  Data Reviewed: I have personally reviewed following labs and imaging studies  CBC: Recent Labs  Lab 10/04/18 2340  10/05/18 1845 10/06/18 0258 10/06/18 1203 10/06/18 2139 10/07/18 1104  WBC 12.2*  --   --   --   --  13.9*  --   HGB 12.3   < > 9.0* 7.9* 7.4* 9.3* 8.9*  HCT 37.9   < > 28.0* 24.1* 23.2* 27.2* 26.6*  MCV 89.0  --   --   --   --  86.6  --   PLT 237  --   --   --   --  183  --    < > = values in this interval not displayed.   Basic Metabolic Panel: Recent Labs  Lab 10/04/18 2340  NA 134*  K 3.8  CL 101  CO2 25  GLUCOSE 157*  BUN 32*  CREATININE 0.65  CALCIUM 8.8*   GFR: Estimated Creatinine Clearance: 72.5 mL/min (by C-G formula based on SCr of 0.65 mg/dL). Liver Function Tests: Recent Labs  Lab 10/04/18 2340  AST 20  ALT 23  ALKPHOS 84  BILITOT 0.6  PROT 6.0*  ALBUMIN 3.0*   Recent Labs  Lab 10/04/18 2340  LIPASE 24   No results for input(s): AMMONIA in the last 168 hours. Coagulation Profile: Recent Labs  Lab 10/05/18 0525  10/06/18 0258 10/07/18 0552  INR 1.8* 2.1* 1.4*   Cardiac Enzymes: No results for input(s): CKTOTAL, CKMB, CKMBINDEX, TROPONINI in the last 168 hours. BNP (last 3 results) No results for input(s): PROBNP in the last 8760 hours. HbA1C: No results for input(s): HGBA1C in the last 72 hours. CBG: Recent Labs  Lab 10/06/18 1142  GLUCAP 117*   Lipid Profile: No results for input(s): CHOL, HDL, LDLCALC, TRIG, CHOLHDL, LDLDIRECT in the last 72 hours. Thyroid Function Tests: No results for input(s): TSH, T4TOTAL, FREET4, T3FREE, THYROIDAB in the last 72 hours. Anemia Panel: No results for input(s): VITAMINB12, FOLATE, FERRITIN, TIBC, IRON, RETICCTPCT in the last 72 hours. Sepsis Labs: No results for input(s): PROCALCITON, LATICACIDVEN in the last 168 hours.  Recent Results (from the past 240 hour(s))  SARS Coronavirus 2 (CEPHEID -  Performed in [facility] hospital lab), Hosp Order     Status: None   Collection Time: 10/05/18  4:51 AM   Specimen: Nasopharyngeal Swab  Result Value Ref Range Status   SARS Coronavirus 2 NEGATIVE NEGATIVE Final    Comment: (NOTE) If result is NEGATIVE SARS-CoV-2 target nucleic acids are NOT DETECTED. The SARS-CoV-2 RNA is generally detectable in upper and lower  respiratory specimens during the acute phase of infection. The lowest  concentration of SARS-CoV-2 viral copies this assay can detect is 250  copies / mL. A negative result does not preclude SARS-CoV-2 infection  and should not be used as the sole basis for treatment or other  patient management decisions.  A negative result may occur with  improper specimen collection / handling, submission of specimen other  than nasopharyngeal swab, presence of viral mutation(s) within the  areas targeted by this assay, and inadequate number of viral copies  (<250 copies / mL). A negative result must be combined with clinical  observations, patient history, and epidemiological information. If result is  POSITIVE SARS-CoV-2 target nucleic acids are DETECTED. The SARS-CoV-2 RNA is generally detectable in upper and lower  respiratory specimens dur ing the acute phase of infection.  Positive  results are indicative of active infection with SARS-CoV-2.  Clinical  correlation with patient history and other diagnostic information is  necessary to determine patient infection status.  Positive results do  not rule out bacterial infection or co-infection with other viruses. If result is PRESUMPTIVE POSTIVE SARS-CoV-2 nucleic acids MAY BE PRESENT.   A presumptive positive result was obtained on the submitted specimen  and confirmed on repeat testing.  While 2019 novel coronavirus  (SARS-CoV-2) nucleic acids may be present in the submitted sample  additional confirmatory testing may be necessary for epidemiological  and / or clinical management purposes  to differentiate between  SARS-CoV-2 and other Sarbecovirus currently known to infect humans.  If clinically indicated additional testing with an alternate test  methodology [phone]) is advised. The SARS-CoV-2 RNA is generally  detectable in upper and lower respiratory sp ecimens during the acute  phase of infection. The expected result is Negative. Fact Sheet for Patients:  StrictlyIdeas.no Fact Sheet for Healthcare Providers: BankingDealers.co.za This test is not yet approved or cleared by the [place] FDA and has been authorized for detection and/or diagnosis of SARS-CoV-2 by FDA under an Emergency Use Authorization (EUA).  This EUA will remain in effect (meaning this test can be used) for the duration of the COVID-19 declaration under Section 564(b)(1) of the Act, 21 U.S.C. section 360bbb-3(b)(1), unless the authorization is terminated or revoked sooner. Performed at [facility], [place] [address] [place], [place] [address]   MRSA PCR Screening     Status: None   Collection Time:  10/06/18  6:09 PM   Specimen: Nasopharyngeal  Result Value Ref Range Status   MRSA by PCR NEGATIVE NEGATIVE Final    Comment:        The GeneXpert MRSA Assay (FDA approved for NASAL specimens only), is one component of a comprehensive MRSA colonization surveillance program. It is not intended to diagnose MRSA infection nor to guide or monitor treatment for MRSA infections. Performed at [facility], [place] [address], [place], [place] [address]     Radiology Studies: Dg Chest 2 View  Result Date: 10/07/2018 CLINICAL DATA:  Patient with chest pain and shortness of breath EXAM: CHEST - 2 VIEW COMPARISON:  Chest radiograph 07/02/2014 FINDINGS:  Monitoring leads overlie the patient. Stable enlarged cardiac and mediastinal contours. Aortic tortuosity. No large area pulmonary consolidation. Bilateral interstitial pulmonary opacities. No pleural effusion or pneumothorax. Thoracic spine degenerative changes. IMPRESSION: Cardiomegaly with mild interstitial edema. Electronically Signed   By: Lovey Newcomer M.D.   On: 10/07/2018 12:19    Scheduled Meds: . sodium chloride   Intravenous Once  . sodium chloride   Intravenous Once  . sodium chloride   Intravenous Once  . [START ON 10/08/2018] pantoprazole  40 mg Intravenous Q12H  . sodium chloride flush  3 mL Intravenous Once   Continuous Infusions: . pantoprozole (PROTONIX) infusion 8 mg/hr (10/06/18 0416)    LOS: 2 days   Time spent: greater than 35 minutes  Little Ishikawa, DO [facility]  If 7PM-7AM, please contact night-coverage [email or web address] Password [email or web address] 10/07/2018, 1:59 PM

## 2018-10-07 NOTE — Progress Notes (Signed)
Pt. complaining of sharp chest pain. ECG done and placed in chart. MD notified. GI cocktail ordered and administered to pt. Will continue to monitor.

## 2018-10-08 ENCOUNTER — Inpatient Hospital Stay (HOSPITAL_COMMUNITY): Payer: Medicare Other

## 2018-10-08 DIAGNOSIS — R8271 Bacteriuria: Secondary | ICD-10-CM

## 2018-10-08 DIAGNOSIS — K254 Chronic or unspecified gastric ulcer with hemorrhage: Principal | ICD-10-CM

## 2018-10-08 LAB — PROTIME-INR
INR: 1.4 — ABNORMAL HIGH (ref 0.8–1.2)
Prothrombin Time: 16.5 seconds — ABNORMAL HIGH (ref 11.4–15.2)

## 2018-10-08 LAB — CBC
HCT: 26.8 % — ABNORMAL LOW (ref 36.0–46.0)
Hemoglobin: 9 g/dL — ABNORMAL LOW (ref 12.0–15.0)
MCH: 29.8 pg (ref 26.0–34.0)
MCHC: 33.6 g/dL (ref 30.0–36.0)
MCV: 88.7 fL (ref 80.0–100.0)
Platelets: 190 10*3/uL (ref 150–400)
RBC: 3.02 MIL/uL — ABNORMAL LOW (ref 3.87–5.11)
RDW: 14.5 % (ref 11.5–15.5)
WBC: 13.6 10*3/uL — ABNORMAL HIGH (ref 4.0–10.5)
nRBC: 0.4 % — ABNORMAL HIGH (ref 0.0–0.2)

## 2018-10-08 MED ORDER — PANTOPRAZOLE SODIUM 40 MG PO TBEC
40.0000 mg | DELAYED_RELEASE_TABLET | Freq: Two times a day (BID) | ORAL | Status: DC
  Administered 2018-10-08 – 2018-10-09 (×2): 40 mg via ORAL
  Filled 2018-10-08 (×2): qty 1

## 2018-10-08 MED ORDER — BUTALBITAL-APAP-CAFFEINE 50-325-40 MG PO TABS: 2.0000 | ORAL_TABLET | Freq: Once | ORAL | Status: DC

## 2018-10-08 MED ORDER — LORAZEPAM 2 MG/ML IJ SOLN
0.5000 mg | Freq: Once | INTRAMUSCULAR | Status: AC
  Administered 2018-10-08: 0.5 mg via INTRAVENOUS
  Filled 2018-10-08: qty 1

## 2018-10-08 MED ORDER — PANTOPRAZOLE SODIUM 40 MG PO TBEC: 40.0000 mg | DELAYED_RELEASE_TABLET | Freq: Two times a day (BID) | ORAL | 1 refills | Status: DC

## 2018-10-08 NOTE — Evaluation (Signed)
Physical Therapy Evaluation Patient Details Name: Shirley [name] MRN: [id number] DOB: 04-May-1940 Today's Date: 10/08/2018   History of Present Illness  Pt is a 78 y.o. F with significant PMH of CAD s/p PCI, DVT, hypertension, CVA who presents with emesis and near syncope in setting of suspected upper GI bleed.  Clinical Impression  Pt admitted with above diagnosis. Pt currently with functional limitations due to the deficits listed below (see PT Problem List). Pt appearing close to functional baseline, reports feeling more fatigued than usual. Ambulating in room with cane without physical assist, slow speed but no gross unsteadiness. BP stable. No PT follow up recommended, will follow acutely.     Follow Up Recommendations No PT follow up    Equipment Recommendations  None recommended by PT    Recommendations for Other Services       Precautions / Restrictions Precautions Precautions: Fall Restrictions Weight Bearing Restrictions: No      Mobility  Bed Mobility               General bed mobility comments: Sitting on EOB upon arrival  Transfers Overall transfer level: Modified independent Equipment used: Straight cane             General transfer comment: slow to rise  Ambulation/Gait Ambulation/Gait assistance: Supervision Gait Distance (Feet): 50 Feet Assistive device: Straight cane Gait Pattern/deviations: Step-through pattern;Decreased stride length     General Gait Details: Slow, steady gait, occasionally reaching for bilateral hand support  Stairs            Wheelchair Mobility    Modified Rankin (Stroke Patients Only)       Balance Overall balance assessment: Mild deficits observed, not formally tested                                           Pertinent Vitals/Pain Pain Assessment: No/denies pain    Home Living Family/patient expects to be discharged to:: Private residence Living Arrangements:  Spouse/significant other Available Help at Discharge: Family;Available 24 hours/day;Other (Comment)(daughter lives next door, works from home) Type of Home: House Home Access: [place]: One Alpha: Kasandra Knudsen - single point      Prior Function Level of Independence: Needs assistance   Gait / Transfers Assistance Needed: uses cane for outdoor mobility  ADL's / Homemaking Assistance Needed: husband assists with IADL's        Hand Dominance        Extremity/Trunk Assessment   Upper Extremity Assessment Upper Extremity Assessment: Overall WFL for tasks assessed    Lower Extremity Assessment Lower Extremity Assessment: Overall WFL for tasks assessed       Communication   Communication: No difficulties  Cognition Arousal/Alertness: Awake/alert Behavior During Therapy: WFL for tasks assessed/performed Overall Cognitive Status: Within Functional Limits for tasks assessed                                        General Comments      Exercises     Assessment/Plan    PT Assessment Patient needs continued PT services  PT Problem List Decreased strength;Decreased activity tolerance;Decreased balance;Decreased mobility       PT Treatment Interventions Gait training;Functional mobility training;Therapeutic activities;Therapeutic exercise;Balance training;Patient/family education    PT  Goals (Current goals can be found in the Care Plan section)  Acute Rehab PT Goals Patient Stated Goal: "work in my garden." PT Goal Formulation: With patient Time For Goal Achievement: 10/22/18 Potential to Achieve Goals: Good    Frequency Min 3X/week   Barriers to discharge        Co-evaluation               AM-PAC PT "6 Clicks" Mobility  Outcome Measure Help needed turning from your back to your side while in a flat bed without using bedrails?: None Help needed moving from lying on your back to sitting on the side of a flat  bed without using bedrails?: None Help needed moving to and from a bed to a chair (including a wheelchair)?: None Help needed standing up from a chair using your arms (e.g., wheelchair or bedside chair)?: None Help needed to walk in hospital room?: None Help needed climbing 3-5 steps with a railing? : A Little 6 Click Score: 23    End of Session   Activity Tolerance: Patient tolerated treatment well Patient left: in bed;with call bell/phone within reach   PT Visit Diagnosis: Unsteadiness on feet (R26.81);Difficulty in walking, not elsewhere classified (R26.2)    Time: 6861-6837 PT Time Calculation (min) (ACUTE ONLY): 19 min   Charges:   PT Evaluation $PT Eval Moderate Complexity: 1 Mod          Ellamae Sia, [place], DPT Acute Rehabilitation Services Pager [phone] Office [phone]   Willy Eddy 10/08/2018, 4:15 PM

## 2018-10-08 NOTE — Progress Notes (Addendum)
Patient ID: Shirley [name], female   DOB: 24-Jun-1940, 78 y.o.   MRN: [id number]    Progress Note   Subjective  Chief complaint: gastric ulcer with hemorrhage   Dull epigastric discomfort as before, no melena since yesterday.  Pt had bad headache last night - resolved today   Had noted sclera red ,eyes feels swollen No SOB today or chest pain  No BM today   HGB 9.0 stable INR 1.4 CT head - negative for acute change  CXR yesterday - cardiomegaly  With mild interstitial  Edema  Hpylori pending   Objective   Vital signs in last 24 hours: Temp:  [97.6 F (36.4 C)-98.3 F (36.8 C)] 97.8 F (36.6 C) (07/05 0539) Pulse Rate:  [61-76] 61 (07/05 0539) Resp:  [17-18] 17 (07/04 1821) BP: (119-139)/(48-56) 119/56 (07/05 0539) SpO2:  [98 %-100 %] 98 % (07/05 0539) Last BM Date: 10/07/18 General:    Elderly female  in NAD Left eye sclera dark red , right eye clear Heart:  Regular rate and rhythm; no murmurs Lungs: Respirations even and unlabored, lungs CTA bilaterally Abdomen:  Soft, obese ,nontender and nondistended. Normal bowel sounds. Extremities:  Without edema. Neurologic:  Alert and oriented,  grossly normal neurologically. Psych:  Cooperative. Normal mood and affect.  Intake/Output from previous day: No intake/output data recorded. Intake/Output this shift: No intake/output data recorded.  Lab Results: Recent Labs    10/06/18 2139 10/07/18 1104 10/07/18 1801 10/08/18 0022  WBC 13.9*  --  14.4* 13.6*  HGB 9.3* 8.9* 8.7* 9.0*  HCT 27.2* 26.6* 26.0* 26.8*  PLT 183  --  195 190   BMET No results for input(s): NA, K, CL, CO2, GLUCOSE, BUN, CREATININE, CALCIUM in the last 72 hours. LFT No results for input(s): PROT, ALBUMIN, AST, ALT, ALKPHOS, BILITOT, BILIDIR, IBILI in the last 72 hours. PT/INR Recent Labs    10/07/18 0552 10/08/18 0022  LABPROT 17.0* 16.5*  INR 1.4* 1.4*    Studies/Results: Dg Chest 2 View  Result Date: 10/07/2018 CLINICAL DATA:   Patient with chest pain and shortness of breath EXAM: CHEST - 2 VIEW COMPARISON:  Chest radiograph 07/02/2014 FINDINGS: Monitoring leads overlie the patient. Stable enlarged cardiac and mediastinal contours. Aortic tortuosity. No large area pulmonary consolidation. Bilateral interstitial pulmonary opacities. No pleural effusion or pneumothorax. Thoracic spine degenerative changes. IMPRESSION: Cardiomegaly with mild interstitial edema. Electronically Signed   By: Lovey Newcomer M.D.   On: 10/07/2018 12:19   Ct Head Wo Contrast  Result Date: 10/08/2018 CLINICAL DATA:  78 y/o  F; Headache, acute, normal neuro exam. EXAM: CT HEAD WITHOUT CONTRAST TECHNIQUE: Contiguous axial images were obtained from the base of the skull through the vertex without intravenous contrast. COMPARISON:  01/05/2015 CT head. FINDINGS: Brain: No evidence of acute infarction, hemorrhage, hydrocephalus, extra-axial collection or mass lesion/mass effect. Prominent extra-axial space superior to left superior frontal gyrus measuring 12 mm thickness, likely a small arachnoid cyst and stable. Cavum septum pellucidum. Stable nonspecific white matter hypodensities compatible with chronic microvascular ischemic changes and stable volume loss of the brain. Vascular: Calcific atherosclerosis of the internal carotid arteries. Skull: Normal. Negative for fracture or focal lesion. Sinuses/Orbits: No acute finding. Other: None. IMPRESSION: 1. No acute intracranial abnormality identified. 2. Stable chronic microvascular ischemic changes and parenchymal volume loss of the brain. Electronically Signed   By: Kristine Garbe M.D.   On: 10/08/2018 02:02       Assessment / Plan:     #28 78 year old female  with acute upper GI bleed secondary to gastric ulcer with visible vessel, status post epi and BiCAP. No active bleeding over the past 36 hours Hemoglobin stable today at 9 H. pylori serology pending  #2 supratherapeutic INR-Coumadin has been on  hold and INR has normalized. #3 prior history of CVA #4 history of coronary artery disease status post MI and PCI 5 remote history of DVT/provoked 6 obesity #7 episode of chest pain yesterday-troponins negative chest x-ray showed mild interstitial edema  #8 anemia     -secondary to   acute blood loss #9-acute headache-resolved-etiology not clear #10 suspect ocular bleed-secondary to coagulopathy   Plan; Soft diet as tolerated DC IV PPI infusion and convert to Protonix 40 mg p.o. twice daily.  Patient will need to be discharged on twice daily Protonix 40 mg x 2 months  Our office will arrange for follow-up EGD with Dr. Loletha Carrow in about 4 weeks.  Patient will be called with appointment date  Patient needs to stay off of Coumadin until follow-up EGD is done and ulcer healing is documented Okay to restart baby aspirin at discharge if felt indicated  GI will sign off, available if needed                                             Principal Problem:   GI bleed Active Problems:   DVT (deep venous thrombosis) ([place])   CAD in native artery, stent to RCA BMS with MI 2015   Asymptomatic bacteriuria   History of CVA (cerebrovascular accident)   Acute upper GI bleed     LOS: 3 days   Shirley [name]  10/08/2018, 11:22 AM   I have discussed the case with the PA, and that is the plan I formulated. I personally interviewed and examined the patient.  I also spoke with her daughter, who the patient had called over speaker phone.  GI bleeding is stopped after endoscopic intervention of gastric ulcer.  Acute blood loss anemia is stable after transfusion.  She will remain off aspirin, we will follow-up H. pylori serology and treat if positive, and plan for repeat upper endoscopy in 4 weeks.  I recommend she remain off Coumadin until that upper endoscopy is repeated to confirm adequate healing of the ulcer for resuming oral anticoagulation.  Total time 25 minutes.  Nelida Meuse III Office:  [phone]

## 2018-10-08 NOTE — Progress Notes (Signed)
PROGRESS NOTE    Shirley [name]  KWI:[id number] DOB: 07-14-1940 DOA: 10/04/2018 PCP: Merrilee Seashore, MD  Brief Narrative:  Shirley [name] is a 78 y.o. female with medical history significant of CAD status post PCI, DVT on Coumadin, GERD, hypertension, hyperlipidemia, CVA presenting to the hospital for evaluation of emesis and near syncope.  Patient states after eating dinner last night she did not feel good.  It felt like she was going to pass out so she sat down on a couch.  Soon after she had an episode of very dark brown vomit.  Subsequently had an episode of very dark stool.  Denies any abdominal pain.  Denies history of prior GI bleed.  States she takes a baby aspirin daily and Coumadin.  She has not been able to go for INR checks since March due to the current COVID pandemic.  Denies any change in her Coumadin dose.  Denies over-the-counter NSAID use.  Denies tobacco or alcohol use.  In the ED patient was not noted to be tachycardic or hypotensive.  FOBT positive. Melanotic stool on exam.  White count 12.2.  Hemoglobin 12.3.  Lipase and LFTs normal.  UA with trace amount of leukocytes, 11-20 WBCs, rare bacteria, and negative nitrite.  High-sensitivity troponin negative.  EKG not suggestive of ACS.  500 cc IV fluid bolus ordered.  Assessment & Plan:   Principal Problem:   GI bleed Active Problems:   DVT (deep venous thrombosis) ([facility])   CAD in native artery, stent to RCA BMS with MI 2015   Asymptomatic bacteriuria   History of CVA (cerebrovascular accident)   Acute upper GI bleed   Acute, now symptomatic, anemia likely in the setting of suspected upper GI bleed POA Presenting with complaints of acute onset coffee-ground emesis and melena - FOBT positive Patient continues to have melanotic stool overnight although small quantity - likely residual blood Hemoglobin stable at 9.0 after 2 units PRBCs/2 units FFP EGD 10/06/18: Large amount of blood, oozing cratered gastric ulcer =  51mm in size; cautery/epi INR 1.4 Will not restart blood thinners until she gets a follow up egd as an outpatient  -h pyolori pending  -PATIENT uncomfortable  TO BE DISCHARGED EVEN THOUGH I DISCHARGED HER and gi okayed it...she wants to stay another night.  Atypical chest pain Began overnight - pleuritic/low rib/upper abdominal in location Improving with GI cocktail Worse with deep inspiration Troponin WNL - EKG without acute changes. No further workup indicated - likely GI/Pleuritic in nature.  Abnormal UA; UTI RULED OUT UA with trace amount of leukocytes, 11-20 WBCs, rare bacteria, and negative nitrite.   Patient is not endorsing any UTI symptoms.   Antibiotics not currently indicated per IDSA guidelines  CAD status post PCI Stable, without symptoms.  High-sensitivity troponin negative and EKG not suggestive of ACS. Hold home aspirin in the setting of acute GI bleed  History of DVT  Hold home Coumadin in the setting of acute GI bleed  CVA Hold home aspirin in the setting of acute GI bleed  DVT prophylaxis: SCDs Code Status: Patient wishes to be full code. Family Communication: No family available. Disposition Plan:  Remain as inpatient, s/p endoscopy with cautery/epi for bleeding gastric ulcer - monitor for an additional 24-48h to ensure no ongoing bleeding or worsening symptoms given previous events and somewhat downtrending hemoglobin.  Consults called: GI - [facility]: Dr. Loletha Carrow Procedures:   EGD 10/07/18  Subjective:had red sclera last pm resolving treated with benadryl and steroids Objective: Vitals:  10/07/18 2211 10/08/18 0539 10/08/18 0700 10/08/18 1218  BP: (!) 121/48 (!) 119/56 (!) 141/62 (!) 137/58  Pulse: 69 61  66  Resp:    16  Temp: 97.6 F (36.4 C) 97.8 F (36.6 C) 97.6 F (36.4 C) 97.7 F (36.5 C)  TempSrc:   Oral Oral  SpO2: 98% 98% 100% 100%  Weight:      Height:       No intake or output data in the 24 hours ending 10/08/18 1624 Filed  Weights   10/05/18 0609 10/06/18 0557 10/06/18 1407  Weight: 119.8 kg 119.3 kg 119.3 kg    Physical examination: General:  Pleasantly resting in bed, No acute distress. HEENT:  Normocephalic atraumatic.  Sclerae nonicteric, noninjected.  Extraocular movements intact bilaterally. Neck:  Without mass or deformity.  Trachea is midline. Lungs:  Clear to auscultate bilaterally without rhonchi, wheeze, or rales. Heart:  Regular rate and rhythm.  Without murmurs, rubs, or gallops. Abdomen:  Soft, minimal upper abdomen tenderness with palpation, nondistended.  Without guarding or rebound. Extremities: Without cyanosis, clubbing, edema, or obvious deformity. Vascular:  Dorsalis pedis and posterior tibial pulses palpable bilaterally. Skin:  Warm and dry, no erythema, no ulcerations.  Data Reviewed: I have personally reviewed following labs and imaging studies  CBC: Recent Labs  Lab 10/04/18 2340  10/06/18 1203 10/06/18 2139 10/07/18 1104 10/07/18 1801 10/08/18 0022  WBC 12.2*  --   --  13.9*  --  14.4* 13.6*  HGB 12.3   < > 7.4* 9.3* 8.9* 8.7* 9.0*  HCT 37.9   < > 23.2* 27.2* 26.6* 26.0* 26.8*  MCV 89.0  --   --  86.6  --  87.8 88.7  PLT 237  --   --  183  --  195 190   < > = values in this interval not displayed.   Basic Metabolic Panel: Recent Labs  Lab 10/04/18 2340  NA 134*  K 3.8  CL 101  CO2 25  GLUCOSE 157*  BUN 32*  CREATININE 0.65  CALCIUM 8.8*   GFR: Estimated Creatinine Clearance: 72.5 mL/min (by C-G formula based on SCr of 0.65 mg/dL). Liver Function Tests: Recent Labs  Lab 10/04/18 2340  AST 20  ALT 23  ALKPHOS 84  BILITOT 0.6  PROT 6.0*  ALBUMIN 3.0*   Recent Labs  Lab 10/04/18 2340  LIPASE 24   No results for input(s): AMMONIA in the last 168 hours. Coagulation Profile: Recent Labs  Lab 10/05/18 0525 10/06/18 0258 10/07/18 0552 10/08/18 0022  INR 1.8* 2.1* 1.4* 1.4*   Cardiac Enzymes: No results for input(s): CKTOTAL, CKMB, CKMBINDEX,  TROPONINI in the last 168 hours. BNP (last 3 results) No results for input(s): PROBNP in the last 8760 hours. HbA1C: No results for input(s): HGBA1C in the last 72 hours. CBG: Recent Labs  Lab 10/06/18 1142  GLUCAP 117*   Lipid Profile: No results for input(s): CHOL, HDL, LDLCALC, TRIG, CHOLHDL, LDLDIRECT in the last 72 hours. Thyroid Function Tests: No results for input(s): TSH, T4TOTAL, FREET4, T3FREE, THYROIDAB in the last 72 hours. Anemia Panel: No results for input(s): VITAMINB12, FOLATE, FERRITIN, TIBC, IRON, RETICCTPCT in the last 72 hours. Sepsis Labs: No results for input(s): PROCALCITON, LATICACIDVEN in the last 168 hours.  Recent Results (from the past 240 hour(s))  SARS Coronavirus 2 (CEPHEID - Performed in [place] hospital lab), Hosp Order     Status: None   Collection Time: 10/05/18  4:51 AM   Specimen: Nasopharyngeal Swab  Result Value Ref Range Status   SARS Coronavirus 2 NEGATIVE NEGATIVE Final    Comment: (NOTE) If result is NEGATIVE SARS-CoV-2 target nucleic acids are NOT DETECTED. The SARS-CoV-2 RNA is generally detectable in upper and lower  respiratory specimens during the acute phase of infection. The lowest  concentration of SARS-CoV-2 viral copies this assay can detect is 250  copies / mL. A negative result does not preclude SARS-CoV-2 infection  and should not be used as the sole basis for treatment or other  patient management decisions.  A negative result may occur with  improper specimen collection / handling, submission of specimen other  than nasopharyngeal swab, presence of viral mutation(s) within the  areas targeted by this assay, and inadequate number of viral copies  (<250 copies / mL). A negative result must be combined with clinical  observations, patient history, and epidemiological information. If result is POSITIVE SARS-CoV-2 target nucleic acids are DETECTED. The SARS-CoV-2 RNA is generally detectable in upper and lower   respiratory specimens dur ing the acute phase of infection.  Positive  results are indicative of active infection with SARS-CoV-2.  Clinical  correlation with patient history and other diagnostic information is  necessary to determine patient infection status.  Positive results do  not rule out bacterial infection or co-infection with other viruses. If result is PRESUMPTIVE POSTIVE SARS-CoV-2 nucleic acids MAY BE PRESENT.   A presumptive positive result was obtained on the submitted specimen  and confirmed on repeat testing.  While 2019 novel coronavirus  (SARS-CoV-2) nucleic acids may be present in the submitted sample  additional confirmatory testing may be necessary for epidemiological  and / or clinical management purposes  to differentiate between  SARS-CoV-2 and other Sarbecovirus currently known to infect humans.  If clinically indicated additional testing with an alternate test  methodology [phone]) is advised. The SARS-CoV-2 RNA is generally  detectable in upper and lower respiratory sp ecimens during the acute  phase of infection. The expected result is Negative. Fact Sheet for Patients:  StrictlyIdeas.no Fact Sheet for Healthcare Providers: BankingDealers.co.za This test is not yet approved or cleared by the [place] FDA and has been authorized for detection and/or diagnosis of SARS-CoV-2 by FDA under an Emergency Use Authorization (EUA).  This EUA will remain in effect (meaning this test can be used) for the duration of the COVID-19 declaration under Section 564(b)(1) of the Act, 21 U.S.C. section 360bbb-3(b)(1), unless the authorization is terminated or revoked sooner. Performed at [facility], [place] [address], [place], [place] [address]   MRSA PCR Screening     Status: None   Collection Time: 10/06/18  6:09 PM   Specimen: Nasopharyngeal  Result Value Ref Range Status   MRSA by PCR NEGATIVE NEGATIVE Final     Comment:        The GeneXpert MRSA Assay (FDA approved for NASAL specimens only), is one component of a comprehensive MRSA colonization surveillance program. It is not intended to diagnose MRSA infection nor to guide or monitor treatment for MRSA infections. Performed at [facility], [place] [address] [place], [place] [address]     Radiology Studies: Dg Chest 2 View  Result Date: 10/07/2018 CLINICAL DATA:  Patient with chest pain and shortness of breath EXAM: CHEST - 2 VIEW COMPARISON:  Chest radiograph 07/02/2014 FINDINGS: Monitoring leads overlie the patient. Stable enlarged cardiac and mediastinal contours. Aortic tortuosity. No large area pulmonary consolidation. Bilateral interstitial pulmonary opacities. No pleural effusion or pneumothorax. Thoracic  spine degenerative changes. IMPRESSION: Cardiomegaly with mild interstitial edema. Electronically Signed   By: Lovey Newcomer M.D.   On: 10/07/2018 12:19   Ct Head Wo Contrast  Result Date: 10/08/2018 CLINICAL DATA:  78 y/o  F; Headache, acute, normal neuro exam. EXAM: CT HEAD WITHOUT CONTRAST TECHNIQUE: Contiguous axial images were obtained from the base of the skull through the vertex without intravenous contrast. COMPARISON:  01/05/2015 CT head. FINDINGS: Brain: No evidence of acute infarction, hemorrhage, hydrocephalus, extra-axial collection or mass lesion/mass effect. Prominent extra-axial space superior to left superior frontal gyrus measuring 12 mm thickness, likely a small arachnoid cyst and stable. Cavum septum pellucidum. Stable nonspecific white matter hypodensities compatible with chronic microvascular ischemic changes and stable volume loss of the brain. Vascular: Calcific atherosclerosis of the internal carotid arteries. Skull: Normal. Negative for fracture or focal lesion. Sinuses/Orbits: No acute finding. Other: None. IMPRESSION: 1. No acute intracranial abnormality identified. 2. Stable chronic microvascular ischemic changes  and parenchymal volume loss of the brain. Electronically Signed   By: Kristine Garbe M.D.   On: 10/08/2018 02:02    Scheduled Meds: . sodium chloride   Intravenous Once  . sodium chloride   Intravenous Once  . sodium chloride   Intravenous Once  . butalbital-acetaminophen-caffeine  2 tablet Oral Once  . pantoprazole  40 mg Oral BID AC  . sodium chloride flush  3 mL Intravenous Once   Continuous Infusions:   LOS: 3 days   Time spent: greater than 35 minutes  Georgette Shell, DO [facility]  If 7PM-7AM, please contact night-coverage [email or web address] Password [facility] 10/08/2018, 4:24 PM

## 2018-10-08 NOTE — Discharge Instructions (Addendum)
Do not take any blood thinners like coumadin or eliquis until you see the gi dr and gets endoscopy done again to make sure your ulcer has been healed. you  May start aspirin tomorrow.

## 2018-10-08 NOTE — Progress Notes (Signed)
Paged by bedside RN regarding the pt developing some redness in bilateral eyes as well as a hives on her upper chest and itching. Ordered Solu-Medrol IV x 1, Benadryl IV x 1, Hydrocortisone 1% cream PRN, and Liquitears PRN. Pt was not experiencing any vision changes, headaches, or neurological deficits. VSS  Repaged by bedside RN that patients eyes have continued to become more bloodshot and pt is c/o headache 8/10. Upon entering room pt resting and in NAD. C/o headache described as constant aching. A&O x 4, PERRLA, MOE x4, no focal deficits noted, no vision changes, and VSS. Hives on patients chest have cleared and she is no longer c/o itching. Will obtain a head CT to rule out any acute abnormality and also ordered Fioricet 2 tabs PO x 1 for her headache.  Arby Barrette APRN-C [facility] Pager [phone]

## 2018-10-09 LAB — PROTIME-INR
INR: 1.4 — ABNORMAL HIGH (ref 0.8–1.2)
Prothrombin Time: 17.1 seconds — ABNORMAL HIGH (ref 11.4–15.2)

## 2018-10-09 LAB — CBC
HCT: 24.1 % — ABNORMAL LOW (ref 36.0–46.0)
Hemoglobin: 8 g/dL — ABNORMAL LOW (ref 12.0–15.0)
MCH: 29.4 pg (ref 26.0–34.0)
MCHC: 33.2 g/dL (ref 30.0–36.0)
MCV: 88.6 fL (ref 80.0–100.0)
Platelets: 175 10*3/uL (ref 150–400)
RBC: 2.72 MIL/uL — ABNORMAL LOW (ref 3.87–5.11)
RDW: 14.6 % (ref 11.5–15.5)
WBC: 9.7 10*3/uL (ref 4.0–10.5)
nRBC: 0.6 % — ABNORMAL HIGH (ref 0.0–0.2)

## 2018-10-09 LAB — H. PYLORI ANTIBODY, IGG: H Pylori IgG: 1.2 Index Value — ABNORMAL HIGH (ref 0.00–0.79)

## 2018-10-09 MED ORDER — FERROUS SULFATE 325 (65 FE) MG PO TABS: 325.0000 mg | ORAL_TABLET | Freq: Two times a day (BID) | ORAL | 3 refills | Status: DC

## 2018-10-09 MED ORDER — VITAMIN C 250 MG PO TABS: 1000.0000 mg | ORAL_TABLET | Freq: Every day | ORAL | Status: DC

## 2018-10-09 NOTE — Plan of Care (Signed)
Care plan needs met

## 2018-10-09 NOTE — Discharge Summary (Signed)
Physician Discharge Summary  Shirley [name] ERX:[id number] DOB: 09-06-1940 DOA: 10/04/2018  PCP: Merrilee Seashore, MD  Admit date: 10/04/2018 Discharge date: 10/09/2018  Admitted From: home Disposition: home  Recommendations for Outpatient Follow-up:  1. Follow up with PCP in 1-2 weeks 2. Please obtain BMP/CBC in one week 3. Please follow up with dr Loletha Carrow 4. Please note her blood thinners were stopped due to GI bleed she would not go back on Coumadin or Eliquis but she may restart aspirin daily.  Home Health none Equipment/Devices: None Discharge Condition stable CODE STATUS: Full code Diet recommendation cardiac Brief/Interim Summary:78 y.o.femalewith medical history significant ofCAD status post PCI, DVTon Coumadin, GERD, hypertension, hyperlipidemia, CVA presenting to the hospital for evaluation of emesisand near syncope.Patient states after eating dinner last night she did not feel good. It felt like she wasgoing to pass out so she sat down on a couch. Soon after she had an episode of very dark brown vomit. Subsequently had an episode of very dark stool. Denies any abdominal pain. Denies history of prior GI bleed. States she takes a baby aspirin daily and Coumadin. She has not been able to go for INR checks since March due to the current COVID pandemic. Denies any change in her Coumadin dose. Denies over-the-counter NSAID use. Denies tobacco or alcohol use.  In the ED patient was not noted to be tachycardic or hypotensive.FOBT positive. Melanotic stool on exam.White count 12.2. Hemoglobin 12.3. Lipase and LFTs normal. UA with trace amount of leukocytes, 11-20 WBCs, rare bacteria, and negative nitrite. High-sensitivity troponin negative. EKG not suggestive of ACS. 500 cc IV fluid bolus ordered.  Discharge Diagnoses:  Principal Problem:   GI bleed Active Problems:   DVT (deep venous thrombosis) (HCC)   CAD in native artery, stent to RCA BMS with MI 2015    Asymptomatic bacteriuria   History of CVA (cerebrovascular accident)   Acute upper GI bleed   Acute, now symptomatic, anemia likely in the setting of suspected upper GI bleed POA Presenting with complaints of acute onset coffee-ground emesis and melena - FOBT positive Hemoglobin  on the day of discharge is 8.0.  Patient reports having black stools overnight.  She was treated with 2 units of packed RBCs and FFP.  EGD 10/06/18: Large amount of blood, oozing cratered gastric ulcer = 68mm in size; cautery/epi INR 1.4 Will not restart blood thinners until she gets a follow up egd as an outpatient  -h pyolori pending  Atypical chest pain resolved improved with GI cocktail cardiac work-up including troponin and EKG were normal.  Abnormal UA; UTI RULED OUT UA with trace amount of leukocytes, 11-20 WBCs, rare bacteria, and negative nitrite. Patient is not endorsing any UTI symptoms.  Antibiotics not currently indicated per IDSA guidelines  CAD status post PCI Stable, without symptoms. High-sensitivity troponin negative and EKG not suggestive of ACS. Restart aspirin.    History ofDVT Hold home Coumadin in the setting of acute GI bleed.  CVA Hold home aspirin in the setting of acute GI bleed  Estimated body mass index is 46.59 kg/m as calculated from the following:   Height as of this encounter: 5\' 3"  (1.6 m).   Weight as of this encounter: 119.3 kg.  Discharge Instructions  Discharge Instructions    Call MD for:  difficulty breathing, headache or visual disturbances   Complete by: As directed    Call MD for:  persistant dizziness or light-headedness   Complete by: As directed    Call MD for:  persistant nausea and vomiting   Complete by: As directed    Diet - low sodium heart healthy   Complete by: As directed    Increase activity slowly   Complete by: As directed      Allergies as of 10/09/2018   No Known Allergies     Medication List    STOP taking these  medications   apixaban 2.5 MG Tabs tablet Commonly known as: Eliquis   warfarin 5 MG tablet Commonly known as: COUMADIN     TAKE these medications   acetaminophen 500 MG tablet Commonly known as: TYLENOL Take 500 mg by mouth every 6 (six) hours as needed for mild pain.   aspirin EC 81 MG tablet Take 1 tablet (81 mg total) by mouth daily.   atorvastatin 80 MG tablet Commonly known as: LIPITOR TAKE 1 TABLET BY MOUTH DAILY AT 6PM What changed: See the new instructions.   ferrous sulfate 325 (65 FE) MG tablet Take 1 tablet (325 mg total) by mouth 2 (two) times daily with a meal.   hydrOXYzine 25 MG tablet Commonly known as: ATARAX/VISTARIL Take 25 mg by mouth daily as needed for anxiety or itching.   lisinopril-hydrochlorothiazide 20-25 MG tablet Commonly known as: Zestoretic Take 1 tablet by mouth daily.   metoprolol succinate 25 MG 24 hr tablet Commonly known as: TOPROL-XL TAKE 1 TABLET BY MOUTH EVERY DAY WITH OR IMMEDIATELY FOLLOWING A MEAL What changed: See the new instructions.   multivitamin with minerals Tabs tablet Take 1 tablet by mouth every morning.   nitroGLYCERIN 0.4 MG SL tablet Commonly known as: NITROSTAT Place 1 tablet (0.4 mg total) under the tongue every 5 (five) minutes x 3 doses as needed for chest pain.   pantoprazole 40 MG tablet Commonly known as: PROTONIX Take 1 tablet (40 mg total) by mouth 2 (two) times daily before a meal.   vitamin C 250 MG tablet Commonly known as: ASCORBIC ACID Take 4 tablets (1,000 mg total) by mouth daily.      Follow-up Information    Merrilee Seashore, MD Follow up.   Specialty: Internal Medicine Contact information: [address] [place] [address] [phone]        Burnell Blanks, MD .   Specialty: Cardiology Contact information: [place] [place] [address] [phone]        Danis, Henry L III, MD Follow up.   Specialty:  Gastroenterology Contact information: [place] [address] [place] [address] [phone]          No Known Allergies  Consultations: GI  Procedures/Studies: Dg Chest 2 View  Result Date: 10/07/2018 CLINICAL DATA:  Patient with chest pain and shortness of breath EXAM: CHEST - 2 VIEW COMPARISON:  Chest radiograph 07/02/2014 FINDINGS: Monitoring leads overlie the patient. Stable enlarged cardiac and mediastinal contours. Aortic tortuosity. No large area pulmonary consolidation. Bilateral interstitial pulmonary opacities. No pleural effusion or pneumothorax. Thoracic spine degenerative changes. IMPRESSION: Cardiomegaly with mild interstitial edema. Electronically Signed   By: Lovey Newcomer M.D.   On: 10/07/2018 12:19   Ct Head Wo Contrast  Result Date: 10/08/2018 CLINICAL DATA:  78 y/o  F; Headache, acute, normal neuro exam. EXAM: CT HEAD WITHOUT CONTRAST TECHNIQUE: Contiguous axial images were obtained from the base of the skull through the vertex without intravenous contrast. COMPARISON:  01/05/2015 CT head. FINDINGS: Brain: No evidence of acute infarction, hemorrhage, hydrocephalus, extra-axial collection or mass lesion/mass effect. Prominent extra-axial space superior  to left superior frontal gyrus measuring 12 mm thickness, likely a small arachnoid cyst and stable. Cavum septum pellucidum. Stable nonspecific white matter hypodensities compatible with chronic microvascular ischemic changes and stable volume loss of the brain. Vascular: Calcific atherosclerosis of the internal carotid arteries. Skull: Normal. Negative for fracture or focal lesion. Sinuses/Orbits: No acute finding. Other: None. IMPRESSION: 1. No acute intracranial abnormality identified. 2. Stable chronic microvascular ischemic changes and parenchymal volume loss of the brain. Electronically Signed   By: Kristine Garbe M.D.   On: 10/08/2018 02:02    (Echo, Carotid, EGD, Colonoscopy, ERCP)    Subjective: Patient  resting in bed no new complaints had black stools Denies any dizziness chest pain nausea vomiting diarrhea Discharge Exam: Vitals:   10/08/18 2139 10/09/18 0451  BP: (!) 141/57 (!) 116/58  Pulse: 80 85  Resp: 18 18  Temp: 98.3 F (36.8 C) 98.2 F (36.8 C)  SpO2: 95% 95%   Vitals:   10/08/18 1218 10/08/18 1600 10/08/18 2139 10/09/18 0451  BP: (!) 137/58 (!) 144/89 (!) 141/57 (!) 116/58  Pulse: 66 71 80 85  Resp: 16  18 18   Temp: 97.7 F (36.5 C) 97.9 F (36.6 C) 98.3 F (36.8 C) 98.2 F (36.8 C)  TempSrc: Oral Oral  Oral  SpO2: 100% 100% 95% 95%  Weight:      Height:        General: Pt is alert, awake, not in acute distress Cardiovascular: RRR, S1/S2 +, no rubs, no gallops Respiratory: CTA bilaterally, no wheezing, no rhonchi Abdominal: Soft, NT, ND, bowel sounds + Extremities: no edema, no cyanosis    The results of significant diagnostics from this hospitalization (including imaging, microbiology, ancillary and laboratory) are listed below for reference.     Microbiology: Recent Results (from the past 240 hour(s))  SARS Coronavirus 2 (CEPHEID - Performed in [place] hospital lab), Hosp Order     Status: None   Collection Time: 10/05/18  4:51 AM   Specimen: Nasopharyngeal Swab  Result Value Ref Range Status   SARS Coronavirus 2 NEGATIVE NEGATIVE Final    Comment: (NOTE) If result is NEGATIVE SARS-CoV-2 target nucleic acids are NOT DETECTED. The SARS-CoV-2 RNA is generally detectable in upper and lower  respiratory specimens during the acute phase of infection. The lowest  concentration of SARS-CoV-2 viral copies this assay can detect is 250  copies / mL. A negative result does not preclude SARS-CoV-2 infection  and should not be used as the sole basis for treatment or other  patient management decisions.  A negative result may occur with  improper specimen collection / handling, submission of specimen other  than nasopharyngeal swab, presence of viral  mutation(s) within the  areas targeted by this assay, and inadequate number of viral copies  (<250 copies / mL). A negative result must be combined with clinical  observations, patient history, and epidemiological information. If result is POSITIVE SARS-CoV-2 target nucleic acids are DETECTED. The SARS-CoV-2 RNA is generally detectable in upper and lower  respiratory specimens dur ing the acute phase of infection.  Positive  results are indicative of active infection with SARS-CoV-2.  Clinical  correlation with patient history and other diagnostic information is  necessary to determine patient infection status.  Positive results do  not rule out bacterial infection or co-infection with other viruses. If result is PRESUMPTIVE POSTIVE SARS-CoV-2 nucleic acids MAY BE PRESENT.   A presumptive positive result was obtained on the submitted specimen  and confirmed on repeat  testing.  While 2019 novel coronavirus  (SARS-CoV-2) nucleic acids may be present in the submitted sample  additional confirmatory testing may be necessary for epidemiological  and / or clinical management purposes  to differentiate between  SARS-CoV-2 and other Sarbecovirus currently known to infect humans.  If clinically indicated additional testing with an alternate test  methodology [phone]) is advised. The SARS-CoV-2 RNA is generally  detectable in upper and lower respiratory sp ecimens during the acute  phase of infection. The expected result is Negative. Fact Sheet for Patients:  StrictlyIdeas.no Fact Sheet for Healthcare Providers: BankingDealers.co.za This test is not yet approved or cleared by the [place] FDA and has been authorized for detection and/or diagnosis of SARS-CoV-2 by FDA under an Emergency Use Authorization (EUA).  This EUA will remain in effect (meaning this test can be used) for the duration of the COVID-19 declaration under Section 564(b)(1)  of the Act, 21 U.S.C. section 360bbb-3(b)(1), unless the authorization is terminated or revoked sooner. Performed at [facility], [place] [address], [place], [place] [address]   MRSA PCR Screening     Status: None   Collection Time: 10/06/18  6:09 PM   Specimen: Nasopharyngeal  Result Value Ref Range Status   MRSA by PCR NEGATIVE NEGATIVE Final    Comment:        The GeneXpert MRSA Assay (FDA approved for NASAL specimens only), is one component of a comprehensive MRSA colonization surveillance program. It is not intended to diagnose MRSA infection nor to guide or monitor treatment for MRSA infections. Performed at [facility], [place] [address] [place], [place] [address]      Labs: BNP (last 3 results) No results for input(s): BNP in the last 8760 hours. Basic Metabolic Panel: Recent Labs  Lab 10/04/18 2340  NA 134*  K 3.8  CL 101  CO2 25  GLUCOSE 157*  BUN 32*  CREATININE 0.65  CALCIUM 8.8*   Liver Function Tests: Recent Labs  Lab 10/04/18 2340  AST 20  ALT 23  ALKPHOS 84  BILITOT 0.6  PROT 6.0*  ALBUMIN 3.0*   Recent Labs  Lab 10/04/18 2340  LIPASE 24   No results for input(s): AMMONIA in the last 168 hours. CBC: Recent Labs  Lab 10/04/18 2340  10/06/18 2139 10/07/18 1104 10/07/18 1801 10/08/18 0022 10/09/18 0422  WBC 12.2*  --  13.9*  --  14.4* 13.6* 9.7  HGB 12.3   < > 9.3* 8.9* 8.7* 9.0* 8.0*  HCT 37.9   < > 27.2* 26.6* 26.0* 26.8* 24.1*  MCV 89.0  --  86.6  --  87.8 88.7 88.6  PLT 237  --  183  --  195 190 175   < > = values in this interval not displayed.   Cardiac Enzymes: No results for input(s): CKTOTAL, CKMB, CKMBINDEX, TROPONINI in the last 168 hours. BNP: Invalid input(s): POCBNP CBG: Recent Labs  Lab 10/06/18 1142  GLUCAP 117*   D-Dimer No results for input(s): DDIMER in the last 72 hours. Hgb A1c No results for input(s): HGBA1C in the last 72 hours. Lipid Profile No results for input(s): CHOL, HDL,  LDLCALC, TRIG, CHOLHDL, LDLDIRECT in the last 72 hours. Thyroid function studies No results for input(s): TSH, T4TOTAL, T3FREE, THYROIDAB in the last 72 hours.  Invalid input(s): FREET3 Anemia work up No results for input(s): VITAMINB12, FOLATE, FERRITIN, TIBC, IRON, RETICCTPCT in the last 72 hours. Urinalysis    Component Value Date/Time   COLORURINE  YELLOW 10/04/2018 2327   APPEARANCEUR CLEAR 10/04/2018 2327   LABSPEC 1.023 10/04/2018 2327   PHURINE 6.0 10/04/2018 2327   GLUCOSEU NEGATIVE 10/04/2018 2327   HGBUR MODERATE (A) 10/04/2018 2327   BILIRUBINUR NEGATIVE 10/04/2018 2327   KETONESUR 20 (A) 10/04/2018 2327   PROTEINUR NEGATIVE 10/04/2018 2327   UROBILINOGEN 1.0 01/25/2014 1623   NITRITE NEGATIVE 10/04/2018 2327   LEUKOCYTESUR TRACE (A) 10/04/2018 2327   Sepsis Labs Invalid input(s): PROCALCITONIN,  WBC,  LACTICIDVEN Microbiology Recent Results (from the past 240 hour(s))  SARS Coronavirus 2 (CEPHEID - Performed in [place] hospital lab), Hosp Order     Status: None   Collection Time: 10/05/18  4:51 AM   Specimen: Nasopharyngeal Swab  Result Value Ref Range Status   SARS Coronavirus 2 NEGATIVE NEGATIVE Final    Comment: (NOTE) If result is NEGATIVE SARS-CoV-2 target nucleic acids are NOT DETECTED. The SARS-CoV-2 RNA is generally detectable in upper and lower  respiratory specimens during the acute phase of infection. The lowest  concentration of SARS-CoV-2 viral copies this assay can detect is 250  copies / mL. A negative result does not preclude SARS-CoV-2 infection  and should not be used as the sole basis for treatment or other  patient management decisions.  A negative result may occur with  improper specimen collection / handling, submission of specimen other  than nasopharyngeal swab, presence of viral mutation(s) within the  areas targeted by this assay, and inadequate number of viral copies  (<250 copies / mL). A negative result must be combined with  clinical  observations, patient history, and epidemiological information. If result is POSITIVE SARS-CoV-2 target nucleic acids are DETECTED. The SARS-CoV-2 RNA is generally detectable in upper and lower  respiratory specimens dur ing the acute phase of infection.  Positive  results are indicative of active infection with SARS-CoV-2.  Clinical  correlation with patient history and other diagnostic information is  necessary to determine patient infection status.  Positive results do  not rule out bacterial infection or co-infection with other viruses. If result is PRESUMPTIVE POSTIVE SARS-CoV-2 nucleic acids MAY BE PRESENT.   A presumptive positive result was obtained on the submitted specimen  and confirmed on repeat testing.  While 2019 novel coronavirus  (SARS-CoV-2) nucleic acids may be present in the submitted sample  additional confirmatory testing may be necessary for epidemiological  and / or clinical management purposes  to differentiate between  SARS-CoV-2 and other Sarbecovirus currently known to infect humans.  If clinically indicated additional testing with an alternate test  methodology [phone]) is advised. The SARS-CoV-2 RNA is generally  detectable in upper and lower respiratory sp ecimens during the acute  phase of infection. The expected result is Negative. Fact Sheet for Patients:  StrictlyIdeas.no Fact Sheet for Healthcare Providers: BankingDealers.co.za This test is not yet approved or cleared by the [place] FDA and has been authorized for detection and/or diagnosis of SARS-CoV-2 by FDA under an Emergency Use Authorization (EUA).  This EUA will remain in effect (meaning this test can be used) for the duration of the COVID-19 declaration under Section 564(b)(1) of the Act, 21 U.S.C. section 360bbb-3(b)(1), unless the authorization is terminated or revoked sooner. Performed at [facility], [place]  [address] [place], [place] [address]   MRSA PCR Screening     Status: None   Collection Time: 10/06/18  6:09 PM   Specimen: Nasopharyngeal  Result Value Ref Range Status   MRSA by PCR NEGATIVE NEGATIVE Final  Comment:        The GeneXpert MRSA Assay (FDA approved for NASAL specimens only), is one component of a comprehensive MRSA colonization surveillance program. It is not intended to diagnose MRSA infection nor to guide or monitor treatment for MRSA infections. Performed at [facility], [place] [address], [place], [place] [address]      Time coordinating discharge:  34 minutes  SIGNED:   Georgette Shell, MD  [facility] 10/09/2018, 8:56 AM  If 7PM-7AM, please contact night-coverage [email or web address] Password [email or web address]

## 2018-10-09 NOTE — Progress Notes (Signed)
Patient was stable at discharge. I removed their IV. We reviewed the discharge education. Patient/Family verbalized understanding and had no further questions. Patient left with belongings in hand.

## 2018-10-13 ENCOUNTER — Telehealth: Payer: Self-pay

## 2018-10-13 ENCOUNTER — Telehealth: Payer: Self-pay | Admitting: Gastroenterology

## 2018-10-13 MED ORDER — AMOXICILLIN 500 MG PO CAPS: 1000.0000 mg | ORAL_CAPSULE | Freq: Two times a day (BID) | ORAL | 0 refills | Status: DC

## 2018-10-13 MED ORDER — CLARITHROMYCIN 250 MG PO TABS: 500.0000 mg | ORAL_TABLET | Freq: Two times a day (BID) | ORAL | 0 refills | Status: DC

## 2018-10-13 NOTE — Telephone Encounter (Signed)
Spoke with pts daughter and she is aware of results and Dr. Loletha Carrow' recommendations.

## 2018-10-13 NOTE — Telephone Encounter (Signed)
Spoke with pts daughter and she is aware of appts and will be there with her mother for previsit over the phone.

## 2018-10-13 NOTE — Telephone Encounter (Signed)
-----   Message from Doran Stabler, MD sent at 10/10/2018  4:51 PM EDT ----- My apologies - was very tired this weekend. I have attached her name.  Thanks  - HD ----- Message ----- From: Algernon Huxley, RN Sent: 10/10/2018   3:25 PM EDT To: Doran Stabler, MD  Dr. Loletha Carrow you sent me this message but no pt was attached to it.  Thanks, Vaughan Basta ----- Message ----- From: Doran Stabler, MD Sent: 10/08/2018  11:13 AM EDT To: Algernon Huxley, RN, Doran Stabler, MD  Vaughan Basta,    This patient will be discharge from hospital soon after UGIB from gastric ulcer.   She needs a repeat upper endoscopy with me in 4 weeks in [place] (LVEF normal , BMI under 50).  Was on aspirin and coumadin prior to admission, but will be off both at least until EGD done.  Let me know if any questions.  I will be back in office Tuesday.   - HD

## 2018-10-13 NOTE — Telephone Encounter (Signed)
Following up on this patient's hospital labs, her H. pylori antibody came up positive.  This may be part of what is causing her gastric ulcer, in addition to the aspirin that she had been on prior to admission.  Reviewing the discharge summary, it does not appear that this was known and therefore treated at the time of discharge.  It needs antibiotic therapy to be started as soon as possible, so that when I performed a repeat upper endoscopy as scheduled, I can also biopsy to confirm eradication of the infection.  I have sent prescriptions for amoxicillin and clarithromycin to her pharmacy.  Both medicines must be taken as directed daily for the entire 14-day course along with the twice daily pantoprazole that she is already taking.  Important: Because the clarithromycin can potentially interact with her atorvastatin (cholesterol medicine), she should not take the atorvastatin while on the antibiotics.

## 2018-10-13 NOTE — Telephone Encounter (Signed)
Pt scheduled for previsit 10/23/18@4 :30pm, EGD scheduled in the [facility] 11/06/18@9am . Left message for pt to call back.

## 2018-10-15 ENCOUNTER — Other Ambulatory Visit: Payer: Self-pay | Admitting: Cardiovascular Disease

## 2018-10-23 ENCOUNTER — Other Ambulatory Visit: Payer: Self-pay

## 2018-10-23 ENCOUNTER — Ambulatory Visit (AMBULATORY_SURGERY_CENTER): Payer: Self-pay | Admitting: *Deleted

## 2018-10-23 VITALS — Ht 63.0 in | Wt 255.2 lb

## 2018-10-23 DIAGNOSIS — K254 Chronic or unspecified gastric ulcer with hemorrhage: Secondary | ICD-10-CM

## 2018-10-23 NOTE — Progress Notes (Signed)
Patient's pre-visit was done today over the phone with the patient  And daughter due to COVID-19 pandemic. Name,DOB and address verified. Insurance verified. Packet of Prep instructions mailed to patient including copy of a consent form and pre-procedure patient acknowledgement form-pt is aware. Patient understands to call us back with any questions or concerns. Patient weighed herself at home, 255.2lbs per pt.  Patient denies any allergies to eggs or soy. Patient denies any problems with anesthesia/sedation. Patient denies any oxygen use at home. Patient denies taking any diet/weight loss medications or blood thinners. EMMI education assisgned to patient on colonoscopy, this was explained and instructions given to patient. Pt is aware that care partner will wait in the car during procedure; if they feel like they will be too hot to wait in the car; they may wait in the lobby.  We want them to wear a mask (we do not have any that we can provide them), practice social distancing, and we will check their temperatures when they get here.  I did remind patient that their care partner needs to stay in the parking lot the entire time. Pt will wear mask into building.

## 2018-11-03 ENCOUNTER — Telehealth: Payer: Self-pay | Admitting: Gastroenterology

## 2018-11-03 NOTE — Telephone Encounter (Signed)
Left message on vmail for patient regarding Covid-19 screening questions. °Covid-19 Screening Questions: °  °Do you now or have you had a fever in the last 14 days?  °  °Do you have any respiratory symptoms of shortness of breath or cough now or in the last 14 days?  °  °Do you have any family members or close contacts with diagnosed or suspected Covid-19 in the past 14 days?  °  °Have you been tested for Covid-19 and found to be positive?  °  ° °

## 2018-11-06 ENCOUNTER — Other Ambulatory Visit: Payer: Self-pay

## 2018-11-06 ENCOUNTER — Telehealth: Payer: Self-pay | Admitting: Cardiovascular Disease

## 2018-11-06 ENCOUNTER — Encounter: Payer: Self-pay | Admitting: Gastroenterology

## 2018-11-06 ENCOUNTER — Ambulatory Visit (AMBULATORY_SURGERY_CENTER): Payer: Medicare Other | Admitting: Gastroenterology

## 2018-11-06 VITALS — BP 121/48 | HR 53 | Temp 98.4°F | Resp 18 | Ht 63.0 in | Wt 256.0 lb

## 2018-11-06 DIAGNOSIS — A048 Other specified bacterial intestinal infections: Secondary | ICD-10-CM

## 2018-11-06 DIAGNOSIS — K254 Chronic or unspecified gastric ulcer with hemorrhage: Secondary | ICD-10-CM

## 2018-11-06 MED ORDER — SODIUM CHLORIDE 0.9 % IV SOLN: 500.0000 mL | Freq: Once | INTRAVENOUS | Status: DC

## 2018-11-06 MED ORDER — PANTOPRAZOLE SODIUM 40 MG PO TBEC: 40.0000 mg | DELAYED_RELEASE_TABLET | Freq: Every day | ORAL | 3 refills | Status: DC

## 2018-11-06 NOTE — Progress Notes (Signed)
Vitals by Nancy/ June temp

## 2018-11-06 NOTE — Progress Notes (Signed)
PT taken to PACU. Monitors in place. VSS. Report given to RN. 

## 2018-11-06 NOTE — Op Note (Signed)
[facility] Patient Name: Jacqeline Broers Procedure Date: 11/06/2018 8:31 AM MRN: [id number] Endoscopist: Mallie Mussel [name] , MD Age: 78 Referring MD:  Date of Birth: June 04, 1940 Gender: Female Account #: [id number] Procedure:                Upper GI endoscopy Indications:              Follow-up of chronic gastric ulcer with hemorrhage,                            Follow-up of Helicobacter pylori (bleeding gastric                            ulcer discovered during hospitalization one month                            ago. H pylori antibody positive, treated with                            clarithromycin and amoxicillin) Medicines:                Monitored Anesthesia Care Procedure:                Pre-Anesthesia Assessment:                           - Prior to the procedure, a History and Physical                            was performed, and patient medications and                            allergies were reviewed. The patient's tolerance of                            previous anesthesia was also reviewed. The risks                            and benefits of the procedure and the sedation                            options and risks were discussed with the patient.                            All questions were answered, and informed consent                            was obtained. Prior Anticoagulants: The patient has                            taken no previous anticoagulant or antiplatelet                            agents except for aspirin. ASA Grade Assessment:  III - A patient with severe systemic disease. After                            reviewing the risks and benefits, the patient was                            deemed in satisfactory condition to undergo the                            procedure.                           After obtaining informed consent, the endoscope was                            passed under direct vision. Throughout the                           procedure, the patient's blood pressure, pulse, and                            oxygen saturations were monitored continuously. The                            Endoscope was introduced through the mouth, and                            advanced to the second part of duodenum. The upper                            GI endoscopy was accomplished without difficulty.                            The patient tolerated the procedure well. Scope In: Scope Out: Findings:                 The esophagus was normal.                           Mucosal changes characterized by scarring were                            found at the incisura. Biopsies were taken with a                            cold forceps for histology. ([facility] protocol).                           The exam of the stomach was otherwise normal.                           The cardia and gastric fundus were normal on                            retroflexion.  The examined duodenum was normal. Complications:            No immediate complications. Estimated Blood Loss:     Estimated blood loss was minimal. Impression:               - Normal esophagus.                           - Scarred mucosa in the incisura.Ulcer completely                            healed. Biopsied to confirm eradication of H.                            pylori infection.                           - Normal examined duodenum. Recommendation:           - Patient has a contact number available for                            emergencies. The signs and symptoms of potential                            delayed complications were discussed with the                            patient. Return to normal activities tomorrow.                            Written discharge instructions were provided to the                            patient.                           - Resume previous diet.                           - Continue present medications  - decrease                            pantoprazole to 40 mg once daily and remain on this                            indefinitely due to chronic aspirin therapy (for                            CAD with prior stent)                           - Await pathology results.                           - Return to primary care physician to follow  hemoglobin and discuss the long term need for                            coumadin (for history of DVT). If coumadin is                            strongly indicated, hemoglobin should be monitored                            regularly. Henry [name], MD 11/06/2018 9:20:07 AM This report has been signed electronically.

## 2018-11-06 NOTE — Telephone Encounter (Signed)
Pt has been on coumadin for DVT and INR followed in our coumadin clinic. On 07/14/18 she was changed to Eliquis. She was admitted on 10/05/18 with GI bleed Per 10/05/18 hospital consult note from Dr. Rosana Hoes pt did not start Eliquis and continued coumadin.  Coumadin was stopped during hospitalization and pt was instructed to continue ASA 81 mg daily. Follow up endoscopy today (note in Epic)   I spoke with pt's daughter who reports ulcer has healed and she was told to check with cardiology to see if coumadin should be resumed. Will forward to Dr. [name] for review/recommendations.   Pt is also due for follow up in our office.  I scheduled telemedicine visit with Melina Copa, PA on August 31,2020 at 9:30

## 2018-11-06 NOTE — Progress Notes (Signed)
Pt's states no medical or surgical changes since previsit or office visit. 

## 2018-11-06 NOTE — Patient Instructions (Signed)
DECREASE PANTOPRAZOLE TO 40 MG ONCE DAILY  RETURN TO PRIMARY CARE PHYSICIAN TO FOLLOW UP ON LONG TERM NEED FOR COUMADIN AND MONITORING OF HEMOGLOBIN   AWAIT RESULTS OF BIOPSIES   YOU HAD AN ENDOSCOPIC PROCEDURE TODAY AT [facility]:   Refer to the procedure report that was given to you for any specific questions about what was found during the examination.  If the procedure report does not answer your questions, please call your gastroenterologist to clarify.  If you requested that your care partner not be given the details of your procedure findings, then the procedure report has been included in a sealed envelope for you to review at your convenience later.  YOU SHOULD EXPECT: Some feelings of bloating in the abdomen. Passage of more gas than usual.  Walking can help get rid of the air that was put into your GI tract during the procedure and reduce the bloating. If you had a lower endoscopy (such as a colonoscopy or flexible sigmoidoscopy) you may notice spotting of blood in your stool or on the toilet paper. If you underwent a bowel prep for your procedure, you may not have a normal bowel movement for a few days.  Please Note:  You might notice some irritation and congestion in your nose or some drainage.  This is from the oxygen used during your procedure.  There is no need for concern and it should clear up in a day or so.  SYMPTOMS TO REPORT IMMEDIATELY:     Following upper endoscopy (EGD)  Vomiting of blood or coffee ground material  New chest pain or pain under the shoulder blades  Painful or persistently difficult swallowing  New shortness of breath  Fever of 100F or higher  Black, tarry-looking stools  For urgent or emergent issues, a gastroenterologist can be reached at any hour by calling [phone].   DIET:  We do recommend a small meal at first, but then you may proceed to your regular diet.  Drink plenty of fluids but you should avoid alcoholic  beverages for 24 hours.  ACTIVITY:  You should plan to take it easy for the rest of today and you should NOT DRIVE or use heavy machinery until tomorrow (because of the sedation medicines used during the test).    FOLLOW UP: Our staff will call the number listed on your records 48-72 hours following your procedure to check on you and address any questions or concerns that you may have regarding the information given to you following your procedure. If we do not reach you, we will leave a message.  We will attempt to reach you two times.  During this call, we will ask if you have developed any symptoms of COVID 19. If you develop any symptoms (ie: fever, flu-like symptoms, shortness of breath, cough etc.) before then, please call [phone].  If you test positive for Covid 19 in the 2 weeks post procedure, please call and report this information to [place].    If any biopsies were taken you will be contacted by phone or by letter within the next 1-3 weeks.  Please call us at [phone] if you have not heard about the biopsies in 3 weeks.    SIGNATURES/CONFIDENTIALITY: You and/or your care partner have signed paperwork which will be entered into your electronic medical record.  These signatures attest to the fact that that the information above on your After Visit Summary has been reviewed and is understood.  Full  responsibility of the confidentiality of this discharge information lies with you and/or your care-partner.

## 2018-11-06 NOTE — Progress Notes (Signed)
Called to room to assist during endoscopic procedure.  Patient ID and intended procedure confirmed with present staff. Received instructions for my participation in the procedure from the performing physician.  

## 2018-11-06 NOTE — Telephone Encounter (Signed)
New Message   Pt c/o medication issue:  1. Name of Medication: coumadin   2. How are you currently taking this medication (dosage and times per day)?   3. Are you having a reaction (difficulty breathing--STAT)?   4. What is your medication issue? Patients daughter is calling on her behalf. She states that the patient has been off her coumadin due to an ulcer. Dr. Loletha Carrow is thinking that maybe the patient does not need to go back on coumadin but feel like the patient should discuss with cardiologist.

## 2018-11-06 NOTE — Progress Notes (Signed)
DR  Loletha Carrow indstructed pt'd daughter & pt to contact cardiologist to restart( or not ) COUMADIN

## 2018-11-06 NOTE — Telephone Encounter (Addendum)
Coumadin clinic dose not make decisions to start or stop anticoagulation. Please forward to patients cardiologist.

## 2018-11-07 NOTE — Telephone Encounter (Signed)
Shirley [name], We follow her INR in our coumadin clinic but her coumadin has been for DVT and this coumadin has been used based on the recommendation of her primary care doctor. I have in my note that it is lifelong coumadin. She will need to check with primary care to see if her coumadin is still indicated for DVT. Gerald Stabs

## 2018-11-08 NOTE — Telephone Encounter (Signed)
Called and spoke to patient's daughter and made her aware of Dr. Camillia Herter recommendations to check with the patient's PCP regarding whether or not her coumadin is still indicated for DVT. She verbalized understanding and states that she will reach out to Dr. Ashby Dawes.

## 2018-11-14 ENCOUNTER — Ambulatory Visit (INDEPENDENT_AMBULATORY_CARE_PROVIDER_SITE_OTHER): Payer: Medicare Other | Admitting: Pharmacist

## 2018-11-14 ENCOUNTER — Other Ambulatory Visit: Payer: Self-pay

## 2018-11-14 DIAGNOSIS — Z5181 Encounter for therapeutic drug level monitoring: Secondary | ICD-10-CM

## 2018-11-14 LAB — POCT INR: INR: 0.9 — AB (ref 2.0–3.0)

## 2018-11-14 MED ORDER — APIXABAN 2.5 MG PO TABS: 2.5000 mg | ORAL_TABLET | Freq: Two times a day (BID) | ORAL | 5 refills | Status: DC

## 2018-11-14 NOTE — Patient Instructions (Addendum)
  Description   Start taking Eliquis 2.5mg  - 1 tablet twice daily. Recheck lab work on Monday Oct 26, any time after 7:30am

## 2018-11-15 ENCOUNTER — Telehealth: Payer: Self-pay | Admitting: Gastroenterology

## 2018-11-15 NOTE — Telephone Encounter (Signed)
Patient notified of results.

## 2018-11-15 NOTE — Telephone Encounter (Signed)
Pt returned your call regarding path results. Pls call her again.

## 2018-11-20 ENCOUNTER — Telehealth: Payer: Self-pay | Admitting: Nurse Practitioner

## 2018-11-20 NOTE — Telephone Encounter (Signed)
Received a new hem referral from Dr. Isidore Moos for Recurrent DVT,Protein s defiency---GI bled on Coumadin. [name] has been scheduled to see Shirley Rue, NP/Dr. Burr [name] on 8/19 at 145pm. Appt date and time has been given to the pt's daughter.

## 2018-11-22 ENCOUNTER — Encounter: Payer: Medicare Other | Admitting: Nurse Practitioner

## 2018-11-22 ENCOUNTER — Telehealth: Payer: Self-pay | Admitting: Nurse Practitioner

## 2018-11-22 NOTE — Telephone Encounter (Signed)
Pt's daughter cld to reschedule her mom's missed appt with Lacie to 9/2 at 145pm.

## 2018-11-22 NOTE — Progress Notes (Deleted)
[facility]  Telephone:([phone] Fax:([phone]) [facility] consult Note   Patient Care Team: Merrilee Seashore, MD as PCP - General (Internal Medicine) Burnell Blanks, MD as PCP - Cardiology (Cardiology) 11/22/2018  CHIEF COMPLAINTS/PURPOSE OF CONSULTATION:  Recurrent DVT, GI bleed on coumadin, referred by   HISTORY OF PRESENTING ILLNESS:  Shirley [name] 78 y.o. female is here because of h/o recurrent DVT and recent GI bleed on coumadin. She was initially seen by Dr. Burr Medico years ago, last f/u in 10/2014.  From historical note, her past medical history is significant for left lower extremity DVT after surgery in 2012 treated with a short course of Coumadin followed by unprovoked DVT in the left leg in 09/2012 treated with rivaroxaban.  In 12/2013 she reported to the ED after syncopal event and was found to have bilateral lower extremity DVT involving bilateral posterior tibial and left peroneal veins.  CTA negative for PE.  Xarelto was discontinued.  She was initiated on IV heparin and transition to Coumadin which she has been on since then.  Hemophilia work-up in the past was negative.  On 10/05/2018 she presented to the ED after an incident of dark brown emesis and very dark stool.  She takes a daily aspirin and Coumadin ** mg.  FOBT was positive with melanotic stool on exam, Hgb 12.3.  EGD on 10/06/2018 showed large amount of blood in the gastric fundus and body with one oozing cratered gastric ulcer, injected with epi and cauterized.  H. pylori was positive and treated.  She was started on PPI discharged home.  Repeat outpatient endoscopy on 11/06/2018 shows the ulcer completely healed.  H. pylori was eradicated.  She was referred back to [place] today to discuss further anticoagulation.  ***She was found to have abnormal CBC from *** ***She denies recent chest pain on exertion, shortness of breath on minimal exertion, pre-syncopal episodes, or palpitations. ***She had  not noticed any recent bleeding such as epistaxis, hematuria or hematochezia ***The patient denies over the counter NSAID ingestion. She is not *** on antiplatelets agents. Her last colonoscopy was *** ***She had no prior history or diagnosis of cancer. Her age appropriate screening programs are up-to-date. ***She denies any pica and eats a variety of diet. ***She never donated blood or received blood transfusion ***The patient was prescribed oral iron supplements and she takes ***  MEDICAL HISTORY:  Past Medical History:  Diagnosis Date  . CAD in native artery, stent to RCA BMS with MI 2015 11/04/2003   LHC (11/2003) - LAD diffuse 30% stenosis of mid-vessel. RCA with 30% stenosis of proximal vessel - by Dr. Dannielle Burn. // 2D Echo (04/2006) -  LV EF 65%.  Mild LVH. No  wall motion abnormalities.  Mild diastolic dysfunction.   01/24/14 STEMI RCA -- procedure revealed 99% proximal RCA lesion. This was treated successfully with PTCA/bare metal stent x 1. Her systolic function is preserved.   . Chest pain 07/03/2014  . Chest wall pain 02/01/2014  . Coronary artery disease, non-occlusive 11/2003; 01/2014   LHC (11/2003) - LAD diffuse 30% stenosis of mid-vessel. RCA with 30% stenosis of proximal vessel - by Dr. Dannielle Burn. // 2D Echo (04/2006) -  LV EF 65%.  Mild LVH. No  wall motion abnormalities.  Mild diastolic dysfunction. 2015 with STEMI- stent palced  . Dizziness 02/01/2014  . DVT (deep venous thrombosis) ([facility]) 2003   lower extremity DVT with questionable recurrence in 2005. Treated with 1.5 year course of coumadin.  [name]  DVT (deep venous thrombosis), unspecified laterality 12/24/2013  . Encounter for therapeutic drug monitoring 12/08/2016  . Endometrial thickening on ultra sound 07/2009   path showing Fragments of benign endocervical and squamous mucosa. No dysplasia or malignancy // Followed by Dr. [name]  . GERD (gastroesophageal reflux disease)   . Hemorrhoid   . History of TIAs    2004 - no  documentation available. 09/2010 -  was not on aspirin or statin therapy prior to event.  [name] Hx of myocardial infarction less than 8 weeks 01/24/14  . Hyperglycemia 09/23/2010  . Hyperlipidemia 09/23/2010  . Hyperlipidemia LDL goal <70   . Hypertension   . Insomnia 11/02/2010  . Memory difficulties 11/02/2010  . Morbid obesity ([facility]) 09/16/2012  . Need for diphtheria-tetanus-pertussis (Tdap) vaccine, adult/adolescent 11/02/2010  . Obstructive sleep apnea 09/23/2010  . Prediabetes DX: 09/2010   HgA1c 6.1  . ST elevation myocardial infarction (STEMI) of inferior wall ([facility]) 01/24/2014  . Stroke ([facility]) 2015  . Syncope 12/18/2013  . TIA (transient ischemic attack) 09/23/2010   09/13/2010: CT, MRI/MRA no acute process. Carotid doppler: no stenosis. 2D-Echo Normal LV function with an EF 55-60%.   [name] UTI (urinary tract infection) 01/27/2014    SURGICAL HISTORY: Past Surgical History:  Procedure Laterality Date  . COLONOSCOPY W/ POLYPECTOMY  11/01/2007   8 mm rectal adenoma, hemorrhoids  . CORONARY ANGIOPLASTY WITH STENT PLACEMENT  01/24/14   Inf. STEMI, BMS to RCA  . ESOPHAGOGASTRODUODENOSCOPY N/A 10/06/2018   Procedure: ESOPHAGOGASTRODUODENOSCOPY (EGD);  Surgeon: Doran Stabler, MD;  Location: [facility];  Service: Gastroenterology;  Laterality: N/A;  . HOT HEMOSTASIS N/A 10/06/2018   Procedure: HOT HEMOSTASIS (ARGON PLASMA COAGULATION/BICAP);  Surgeon: Doran Stabler, MD;  Location: [facility];  Service: Gastroenterology;  Laterality: N/A;  . KNEE SURGERY  1996   Left knee arthroscopy.  [name] LEFT HEART CATHETERIZATION WITH CORONARY ANGIOGRAM N/A 01/24/2014   Procedure: LEFT HEART CATHETERIZATION WITH CORONARY ANGIOGRAM;  Surgeon: Burnell Blanks, MD;  Location: [facility] CATH LAB;  Service: Cardiovascular;  Laterality: N/A;  . ROTATOR CUFF REPAIR     right.  [name]  10/06/2018   Procedure: [name];  Surgeon: Doran Stabler, MD;  Location: [facility];  Service:  Gastroenterology;;  . TUBAL LIGATION      SOCIAL HISTORY: Social History   Socioeconomic History  . Marital status: Married    Spouse name: Not on file  . Number of children: 6  . Years of education: College  . Highest education level: Not on file  Occupational History  . Occupation: Control and instrumentation engineer  . Occupation: [employer]    Employer: [employer]  Social Needs  . Financial resource strain: Not on file  . Food insecurity    Worry: Not on file    Inability: Not on file  . Transportation needs    Medical: Not on file    Non-medical: Not on file  Tobacco Use  . Smoking status: Never Smoker  . Smokeless tobacco: Never Used  Substance and Sexual Activity  . Alcohol use: No  . Drug use: No  . Sexual activity: Not on file  Lifestyle  . Physical activity    Days per week: Not on file    Minutes per session: Not on file  . Stress: Not on file  Relationships  . Social Herbalist on phone: Not on file    Gets together: Not on file    Attends  religious service: Not on file    Active member of club or organization: Not on file    Attends meetings of clubs or organizations: Not on file    Relationship status: Not on file  . Intimate partner violence    Fear of current or ex partner: Not on file    Emotionally abused: Not on file    Physically abused: Not on file    Forced sexual activity: Not on file  Other Topics Concern  . Not on file  Social History Narrative   Full Code status.   Insurance: BCBS    FAMILY HISTORY: Family History  Problem Relation Age of Onset  . Diabetes Mother   . Hyperlipidemia Mother   . Heart disease Mother   . Dementia Mother   . Angina Father   . Heart attack Father   . Thyroid cancer Sister   . Liver cancer Sister   . Heart disease Brother        x 2 in 67s yo  . Alzheimer's disease Maternal Grandmother   . Stroke Neg Hx   . Colon cancer Neg Hx   . Esophageal cancer Neg Hx   . Rectal  cancer Neg Hx   . Stomach cancer Neg Hx     ALLERGIES:  has No Known Allergies.  MEDICATIONS:  Current Outpatient Medications  Medication Sig Dispense Refill  . acetaminophen (TYLENOL) 500 MG tablet Take 500 mg by mouth every 6 (six) hours as needed for mild pain.     [name] apixaban (ELIQUIS) 2.5 MG TABS tablet Take 1 tablet (2.5 mg total) by mouth 2 (two) times daily. 60 tablet 5  . aspirin EC 81 MG tablet Take 1 tablet (81 mg total) by mouth daily. 90 tablet 3  . atorvastatin (LIPITOR) 80 MG tablet Take 1 tablet (80 mg total) by mouth daily. Need appt for further refills, 1st attempt 90 tablet 0  . ferrous sulfate 325 (65 FE) MG tablet Take 1 tablet (325 mg total) by mouth 2 (two) times daily with a meal.  3  . hydrOXYzine (ATARAX/VISTARIL) 25 MG tablet Take 25 mg by mouth daily as needed for anxiety or itching.     [name] lisinopril-hydrochlorothiazide (ZESTORETIC) 20-25 MG tablet TAKE 1 TABLET BY MOUTH EVERY DAY 90 tablet 3  . metoprolol succinate (TOPROL-XL) 25 MG 24 hr tablet Take 1 tablet (25 mg total) by mouth daily. Need appt for further refills, 1st attempt 90 tablet 0  . Multiple Vitamin (MULTIVITAMIN WITH MINERALS) TABS Take 1 tablet by mouth every morning.    . nitroGLYCERIN (NITROSTAT) 0.4 MG SL tablet Place 1 tablet (0.4 mg total) under the tongue every 5 (five) minutes x 3 doses as needed for chest pain. 25 tablet 3  . pantoprazole (PROTONIX) 40 MG tablet Take 1 tablet (40 mg total) by mouth daily. 90 tablet 3  . vitamin C (ASCORBIC ACID) 250 MG tablet Take 4 tablets (1,000 mg total) by mouth daily.     No current facility-administered medications for this visit.     REVIEW OF SYSTEMS:   Constitutional: Denies fevers, chills or abnormal night sweats Eyes: Denies blurriness of vision, double vision or watery eyes Ears, nose, mouth, throat, and face: Denies mucositis or sore throat Respiratory: Denies cough, dyspnea or wheezes Cardiovascular: Denies palpitation, chest discomfort or  lower extremity swelling Gastrointestinal:  Denies nausea, heartburn or change in bowel habits Skin: Denies abnormal skin rashes Lymphatics: Denies new lymphadenopathy or easy bruising Neurological:Denies numbness, tingling or  new weaknesses Behavioral/Psych: Mood is stable, no new changes  All other systems were reviewed with the patient and are negative.  PHYSICAL EXAMINATION: ECOG PERFORMANCE STATUS: {CHL ONC ECOG [phone]}  There were no vitals filed for this visit. There were no vitals filed for this visit.  GENERAL:alert, no distress and comfortable SKIN: skin color, texture, turgor are normal, no rashes or significant lesions EYES: normal, conjunctiva are pink and non-injected, sclera clear OROPHARYNX:no exudate, no erythema and lips, buccal mucosa, and tongue normal  NECK: supple, thyroid normal size, non-tender, without nodularity LYMPH:  no palpable lymphadenopathy in the cervical, axillary or inguinal LUNGS: clear to auscultation and percussion with normal breathing effort HEART: regular rate & rhythm and no murmurs and no lower extremity edema ABDOMEN:abdomen soft, non-tender and normal bowel sounds Musculoskeletal:no cyanosis of digits and no clubbing  PSYCH: alert & oriented x 3 with fluent speech NEURO: no focal motor/sensory deficits  LABORATORY DATA:  I have reviewed the data as listed CBC Latest Ref Rng & Units 10/09/2018 10/08/2018 10/07/2018  WBC 4.0 - 10.5 K/uL 9.7 13.6(H) 14.4(H)  Hemoglobin 12.0 - 15.0 g/dL 8.0(L) 9.0(L) 8.7(L)  Hematocrit 36.0 - 46.0 % 24.1(L) 26.8(L) 26.0(L)  Platelets 150 - 400 K/uL 175 190 195    CMP Latest Ref Rng & Units 10/04/2018 10/18/2017 01/05/2015  Glucose 70 - 99 mg/dL 157(H) 131(H) 100(H)  BUN 8 - 23 mg/dL 32(H) 11 18  Creatinine 0.44 - 1.00 mg/dL 0.65 0.68 0.70  Sodium 135 - 145 mmol/L 134(L) 132(L) 133(L)  Potassium 3.5 - 5.1 mmol/L 3.8 4.2 5.4(H)  Chloride 98 - 111 mmol/L 101 95(L) 99(L)  CO2 22 - 32 mmol/L 25 23 -   Calcium 8.9 - 10.3 mg/dL 8.8(L) 8.9 -  Total Protein 6.5 - 8.1 g/dL 6.0(L) - -  Total Bilirubin 0.3 - 1.2 mg/dL 0.6 - -  Alkaline Phos 38 - 126 U/L 84 - -  AST 15 - 41 U/L 20 - -  ALT 0 - 44 U/L 23 - -     RADIOGRAPHIC STUDIES: I have personally reviewed the radiological images as listed and agreed with the findings in the report. No results found.  ASSESSMENT & PLAN:  No problem-specific Assessment & Plan notes found for this encounter.    All questions were answered. The patient knows to call the clinic with any problems, questions or concerns. I spent {CHL ONC TIME VISIT - TXHFS:[id number]} counseling the patient face to face. The total time spent in the appointment was {CHL ONC TIME VISIT - BXIDH:[id number]} and more than 50% was on counseling.     Alla Feeling, NP 11/22/18 12:44 PM

## 2018-12-01 ENCOUNTER — Encounter: Payer: Self-pay | Admitting: *Deleted

## 2018-12-03 ENCOUNTER — Encounter: Payer: Self-pay | Admitting: Physician Assistant

## 2018-12-03 NOTE — Progress Notes (Signed)
Virtual Visit via Telephone Note   This visit type was conducted due to national recommendations for restrictions regarding the COVID-19 Pandemic (e.g. social distancing) in an effort to limit this patient's exposure and mitigate transmission in our community.  Due to her co-morbid illnesses, this patient is at least at moderate risk for complications without adequate follow up.  This format is felt to be most appropriate for this patient at this time.  The patient did not have access to video technology/had technical difficulties with video requiring transitioning to audio format only (telephone).  All issues noted in this document were discussed and addressed.  No physical exam could be performed with this format.  Patient consented to proceeding with telehealth visit with [facility] today.  Date:  12/04/2018   ID:  Shirley [name], DOB [birth year], MRN [id number]  Patient Location: Home Provider Location: Home  PCP:  Merrilee Seashore, MD  Cardiologist:  Lauree Chandler, MD  Electrophysiologist:  None   Evaluation Performed:  Follow-Up Visit  Chief Complaint:  1 year f/u CAD, also f/u GIB  History of Present Illness:    Shirley [name] is a 78 y.o. female with CAD with inferior STEMI 01/2014 s/p BMS to prox RCA, HTN, HLD, pre-DM, TIA, ?stroke (details unclear in chart), morbid obesity, recurrent DVT, mildly dilated ascending aorta, pulmonary nodule, GI bleed/anemia in 10/2018 and sleep apnea who is seen virtually for cardiac follow-up in the setting of the Covid-19 pandemic. She had a nuclear stress test in 09/2017 for atypical chest pain lasting a few seconds. A nuclear stress test was arranged on 09/27/17 and was low risk with normal LV function and no ischemia. There was a possible apical lateral scar. She was doing well at time of f/u 10/10/17. Last echo in 2015 showed mild LVH, EF 55-60%, mildly dilated ascending aorta, trileaflet AV. CT angio chest 2015 showed bovine arch  anatomy, 3mm pulonary nodule. She was admitted 10/2018 with near-syncope and a GI bleed. EGD 10/06/18: Large amount of blood, oozing cratered gastric ulcer. Coumadin and aspirin were stopped during hospitalization. Aspirin was resumed at discharge. GI recommended she f/u with cardiology to discuss resumption since repeat EGD was felt satisfactory. Dr. [name] did not traditionally dictate the treatment course for her DVT history so recommended she discuss with primary care. It appears she has since been switched to DVT prophylaxis dose of Eliquis and primary care has referred her to hematology for input. Last labs in 11/2018 showed Hgb 10.9 (improving from 8 range), K 4.6, Cr 0.6, Na 127 (in labs by primary care), 10/2018 AST/ALT wnl, 02/2018 LDL 48, trig 91.    She is seen virtually today feeling well. She reports her recovery was slow coming out of the hospital but she has steadily regained strength and denies any dizziness, CP, or SOB. She reports she is no longer on ASA because she was told by GI it would not be good given her history of ulcer. She denies any recurrent bleeding. She is being followed closely as outpatient by primary care. The patient does not have symptoms concerning for COVID-19 infection (fever, chills, cough, or new shortness of breath).    Past Medical History:  Diagnosis Date   Anemia    CAD in native artery, stent to RCA BMS with MI 2015 11/04/2003   a. inferior STEMI 01/2014 s/p BMS to prox RCA.   DVT (deep venous thrombosis) ([place]) 2003   lower extremity DVT with questionable recurrence in 2005. Treated with 1.5  year course of coumadin. Another admission 2015.   Endometrial thickening on ultra sound 07/2009   path showing Fragments of benign endocervical and squamous mucosa. No dysplasia or malignancy // Followed by Dr. [name]   First degree AV block    Gastric ulcer    GERD (gastroesophageal reflux disease)    GI bleed    Hemorrhoid    Hyperlipidemia LDL goal  <70    Hypertension    Insomnia 11/02/2010   Memory difficulties 11/02/2010   Mild dilation of ascending aorta ([place])    Morbid obesity ([place]) 09/16/2012   Obstructive sleep apnea 09/23/2010   Prediabetes DX: 09/2010   HgA1c 6.1   Pulmonary nodule    ST elevation myocardial infarction (STEMI) of inferior wall ([place]) 01/24/2014   Stroke ([place]) 2015   Syncope 12/18/2013   TIA (transient ischemic attack) 09/23/2010   09/13/2010: CT, MRI/MRA no acute process. Carotid doppler: no stenosis. 2D-Echo Normal LV function with an EF 55-60%.    UTI (urinary tract infection) 01/27/2014   Past Surgical History:  Procedure Laterality Date   COLONOSCOPY W/ POLYPECTOMY  11/01/2007   8 mm rectal adenoma, hemorrhoids   CORONARY ANGIOPLASTY WITH STENT PLACEMENT  01/24/14   Inf. STEMI, BMS to RCA   ESOPHAGOGASTRODUODENOSCOPY N/A 10/06/2018   Procedure: ESOPHAGOGASTRODUODENOSCOPY (EGD);  Surgeon: Doran Stabler, MD;  Location: [place];  Service: Gastroenterology;  Laterality: N/A;   HOT HEMOSTASIS N/A 10/06/2018   Procedure: HOT HEMOSTASIS (ARGON PLASMA COAGULATION/BICAP);  Surgeon: Doran Stabler, MD;  Location: [place];  Service: Gastroenterology;  Laterality: N/A;   KNEE SURGERY  1996   Left knee arthroscopy.   LEFT HEART CATHETERIZATION WITH CORONARY ANGIOGRAM N/A 01/24/2014   Procedure: LEFT HEART CATHETERIZATION WITH CORONARY ANGIOGRAM;  Surgeon: Burnell Blanks, MD;  Location: [facility] CATH LAB;  Service: Cardiovascular;  Laterality: N/A;   ROTATOR CUFF REPAIR     right.   SCLEROTHERAPY  10/06/2018   Procedure: SCLEROTHERAPY;  Surgeon: Doran Stabler, MD;  Location: [place];  Service: Gastroenterology;;   TUBAL LIGATION       Current Meds  Medication Sig   acetaminophen (TYLENOL) 500 MG tablet Take 500 mg by mouth every 6 (six) hours as needed for mild pain.    apixaban (ELIQUIS) 2.5 MG TABS tablet Take 1 tablet (2.5 mg total) by mouth 2 (two) times daily.    atorvastatin (LIPITOR) 80 MG tablet Take 1 tablet (80 mg total) by mouth daily. Need appt for further refills, 1st attempt   ferrous sulfate 325 (65 FE) MG tablet Take 1 tablet (325 mg total) by mouth 2 (two) times daily with a meal.   hydrOXYzine (ATARAX/VISTARIL) 25 MG tablet Take 25 mg by mouth daily as needed for anxiety or itching.    lisinopril-hydrochlorothiazide (ZESTORETIC) 20-25 MG tablet TAKE 1 TABLET BY MOUTH EVERY DAY   metoprolol succinate (TOPROL-XL) 25 MG 24 hr tablet Take 1 tablet (25 mg total) by mouth daily. Need appt for further refills, 1st attempt   Multiple Vitamin (MULTIVITAMIN WITH MINERALS) TABS Take 1 tablet by mouth every morning.   nitroGLYCERIN (NITROSTAT) 0.4 MG SL tablet Place 1 tablet (0.4 mg total) under the tongue every 5 (five) minutes x 3 doses as needed for chest pain.   pantoprazole (PROTONIX) 40 MG tablet Take 1 tablet (40 mg total) by mouth daily.   vitamin C (ASCORBIC ACID) 250 MG tablet Take 4 tablets (1,000 mg total) by mouth daily.  Allergies:   Patient has no known allergies.   Social History   Tobacco Use   Smoking status: Never Smoker   Smokeless tobacco: Never Used  Substance Use Topics   Alcohol use: No   Drug use: No     Family Hx: The patient's family history includes Alzheimer's disease in her maternal grandmother; Angina in her father; Dementia in her mother; Diabetes in her mother; Heart attack in her father; Heart disease in her brother and mother; Hyperlipidemia in her mother; Liver cancer in her sister; Thyroid cancer in her sister. There is no history of Stroke, Colon cancer, Esophageal cancer, Rectal cancer, or Stomach cancer.  ROS:   Please see the history of present illness.    All other systems reviewed and are negative.   Prior CV studies:    Most recent pertinent cardiac studies are outlined above.  Labs/Other Tests and Data Reviewed:    EKG:  An ECG dated 10/07/18 was personally reviewed today and  demonstrated:  NSR 1st degree AVB, left axis deviation, nonspecific TW changes  Recent Labs: 10/04/2018: ALT 23; BUN 32; Creatinine, Ser 0.65; Potassium 3.8; Sodium 134 10/09/2018: Hemoglobin 8.0; Platelets 175   Recent Lipid Panel Lab Results  Component Value Date/Time   CHOL 107 03/19/2014 07:45 AM   TRIG 110.0 03/19/2014 07:45 AM   HDL 24.40 (L) 03/19/2014 07:45 AM   CHOLHDL 4 03/19/2014 07:45 AM   LDLCALC 61 03/19/2014 07:45 AM    Wt Readings from Last 3 Encounters:  12/04/18 240 lb (108.9 kg)  11/06/18 256 lb (116.1 kg)  10/23/18 255 lb 3.2 oz (115.8 kg)     Objective:    Vital Signs:  Ht 5\' 3"  (1.6 m)    Wt 240 lb (108.9 kg)    BMI 42.51 kg/m    VS reviewed. General - pleasant female in no acute distress Pulm - No labored breathing, no coughing during visit, no audible wheezing, speaking in full sentences Neuro - A+Ox3, no slurred speech, answers questions appropriately Psych - Pleasant affect    ASSESSMENT & PLAN:    1. CAD - symptomatically stable. Continue statin, BB. She is no longer on aspirin as above, although does look like she was discharged on it. She is now on DVT-prophylaxis-dose of apixaban, pending hematology evaluation. Dr. [name] has traditionally deferred plan for treatment DVT to primary care, so I think heme input is helpful. I will reach out to Dr. [name] to determine his input on if he is OK to keep patient off aspirin for now, or if he would like for her to revisit resumption with GI. Has labs scheduled with our office for October given new start Eliquis. Also has close f/u with primary care as well. 2. Essential HTN - does not have values with her today but recently reasonable in the hospital, she reports normal values when checking at home. The patient was instructed to monitor their blood pressure at home and to call if tending to run higher than 130/80. 3. Hyperlipidemia - labs followed by PCP, recent lipids in 02/2018 were satisfactory - she  reports she has f/u in October with them for follow-up bloodwork. Continue present regimen. 4. Mildly dilated ascending aorta, also with h/o pulmonary nodule - she is overdue for CT for pulm nodule. Will arrange CT of aorta for f/u of her dilation, which will also reassess the pulm nodule as well.  COVID-19 Education: The signs and symptoms of COVID-19 were discussed with the patient and how to  seek care for testing (follow up with PCP or arrange E-visit).  The importance of social distancing was discussed today.  Time:   Today, I have spent 20 minutes with the patient with telehealth technology discussing the above problems.     Medication Adjustments/Labs and Tests Ordered: Current medicines are reviewed at length with the patient today.  Concerns regarding medicines are outlined above.   Disposition:  Follow up in 6 months with Dr. [name] in the office physically.  Signed, Charlie Pitter, PA-C  12/04/2018 9:58 AM    [place]

## 2018-12-04 ENCOUNTER — Telehealth (INDEPENDENT_AMBULATORY_CARE_PROVIDER_SITE_OTHER): Payer: Medicare Other | Admitting: Physician Assistant

## 2018-12-04 ENCOUNTER — Encounter: Payer: Self-pay | Admitting: Physician Assistant

## 2018-12-04 ENCOUNTER — Telehealth: Payer: Self-pay | Admitting: Physician Assistant

## 2018-12-04 ENCOUNTER — Other Ambulatory Visit: Payer: Self-pay

## 2018-12-04 VITALS — Ht 63.0 in | Wt 240.0 lb

## 2018-12-04 DIAGNOSIS — I10 Essential (primary) hypertension: Secondary | ICD-10-CM

## 2018-12-04 DIAGNOSIS — E785 Hyperlipidemia, unspecified: Secondary | ICD-10-CM

## 2018-12-04 DIAGNOSIS — I2510 Atherosclerotic heart disease of native coronary artery without angina pectoris: Secondary | ICD-10-CM

## 2018-12-04 DIAGNOSIS — I77810 Thoracic aortic ectasia: Secondary | ICD-10-CM

## 2018-12-04 NOTE — Patient Instructions (Addendum)
Medication Instructions:  Your physician recommends that you continue on your current medications as directed. Please refer to the Current Medication list given to you today.  If you need a refill on your cardiac medications before your next appointment, please call your pharmacy.   Lab work: None ordered  Keep anticoag lab appt in 01/2019  If you have labs (blood work) drawn today and your tests are completely normal, you will receive your results only by: [name] MyChart Message (if you have MyChart) OR . A paper copy in the mail If you have any lab test that is abnormal or we need to change your treatment, we will call you to review the results.  Testing/Procedures:  SOMEONE WILL REACH OUT TO YOU TO SCHEDULED THE CT Non-Cardiac CT Angiography (CTA), is a special type of CT scan that uses a computer to produce multi-dimensional views of major blood vessels throughout the body. In CT angiography, a contrast material is injected through an IV to help visualize the blood vessels   Follow-Up: At [facility], you and your health needs are our priority.  As part of our continuing mission to provide you with exceptional heart care, we have created designated Provider Care Teams.  These Care Teams include your primary Cardiologist (physician) and Advanced Practice Providers (APPs -  Physician Assistants and Nurse Practitioners) who all work together to provide you with the care you need, when you need it. You will need a follow up appointment in 6 months.  Please call our office 2 months in advance to schedule this appointment.  You may see Lauree Chandler, MD or one of the following Advanced Practice Providers on your designated Care Team:   [place], PA-C Melina Copa, PA-C . Ermalinda Barrios, PA-C  Any Other Special Instructions Will Be Listed Below (If Applicable).  CT Angiogram  A CT angiogram is a procedure to look at the blood vessels in various areas of the body. For this  procedure, a large X-ray machine, called a CT scanner, takes detailed pictures of blood vessels that have been injected with a dye (contrast material). A CT angiogram allows your health care provider to see how well blood is flowing to the area of your body that is being checked. Your health care provider will be able to see if there are any problems, such as a blockage. Tell a health care provider about:  Any allergies you have.  All medicines you are taking, including vitamins, herbs, eye drops, creams, and over-the-counter medicines.  Any problems you or family members have had with anesthetic medicines.  Any blood disorders you have.  Any surgeries you have had.  Any medical conditions you have.  Whether you are pregnant or may be pregnant.  Whether you are breastfeeding.  Any anxiety disorders, chronic pain, or other conditions you have that may increase your stress or prevent you from lying still. What are the risks? Generally, this is a safe procedure. However, problems may occur, including:  Infection.  Bleeding.  Allergic reactions to medicines or dyes.  Damage to other structures or organs.  Kidney damage from the dye or contrast that is used.  Increased risk of cancer from radiation exposure. This risk is low. Talk with your health care provider about: ? The risks and benefits of testing. ? How you can receive the lowest dose of radiation. What happens before the procedure?  Wear comfortable clothing and remove any jewelry.  Follow instructions from your health care provider about eating and  drinking. For most people, instructions may include these actions: ? For 12 hours before the test, avoid caffeine. This includes tea, coffee, soda, and energy drinks or pills. ? For 3-4 hours before the test, stop eating or drinking anything but water. ? Stay well hydrated by continuing to drink water before the exam. This will help to clear the contrast dye from your body  after the test.  Ask your health care provider about changing or stopping your regular medicines. This is especially important if you are taking diabetes medicines or blood thinners. What happens during the procedure?  An IV tube will be inserted into one of your veins.  You will be asked to lie on an exam table. This table will slide in and out of the CT machine during the procedure.  Contrast dye will be injected into the IV tube. You might feel warm, or you may get a metallic taste in your mouth.  The table that you are lying on will move into the CT machine tunnel for the scan.  The person running the machine will give you instructions while the scans are being done. You may be asked to: ? Keep your arms above your head. ? Hold your breath. ? Stay very still, even if the table is moving.  When the scanning is complete, you will be moved out of the machine.  The IV tube will be removed. The procedure may vary among health care providers and hospitals. What happens after the procedure?  You might feel warm, or you may get a metallic taste in your mouth.  You may be asked to drink water or other fluids to wash (flush) the contrast material out of your body.  It is up to you to get the results of your procedure. Ask your health care provider, or the department that is doing the procedure, when your results will be ready. Summary  A CT angiogram is a procedure to look at the blood vessels in various areas of the body.  You will need to stay very still during the exam.  You may be asked to drink water or other fluids to wash (flush) the contrast material out of your body after your scan. This information is not intended to replace advice given to you by your health care provider. Make sure you discuss any questions you have with your health care provider. Document Released: 11/20/2015 Document Revised: 06/01/2018 Document Reviewed: 11/20/2015 Elsevier Patient Education  2020  [employer].

## 2018-12-04 NOTE — Telephone Encounter (Signed)
   Please let pt know I reviewed her case with Dr. [name] who agrees she is OK to remain OFF aspirin at this time. She told me that GI told her to stay off it, so I just wanted to confirm this was OK from heart standpoint as well. (She does not use MyChart so would not send MyChart message.)   Melina Copa PA-C

## 2018-12-04 NOTE — Telephone Encounter (Signed)
Call placed to pt re: Aspirin, (see message below).  Pt has been made aware that she can remain off Aspirin at this time.

## 2018-12-05 ENCOUNTER — Other Ambulatory Visit: Payer: Self-pay | Admitting: Pharmacist

## 2018-12-05 DIAGNOSIS — I82409 Acute embolism and thrombosis of unspecified deep veins of unspecified lower extremity: Secondary | ICD-10-CM

## 2018-12-05 NOTE — Progress Notes (Addendum)
[facility]  [phone] [phone] [place] consult Note   Patient Care Team: Merrilee Seashore, MD as PCP - General (Internal Medicine) Burnell Blanks, MD as PCP - Cardiology (Cardiology) 12/06/2018  CHIEF COMPLAINTS/PURPOSE OF CONSULTATION:  Recurrent DVT, GI bleed on anticoagulation   HISTORY OF PRESENTING ILLNESS:  Shirley [name] 78 y.o. female previously seen by Dr. Burr Medico in 2016. Historical records reveal a provoked LLE DVT after surgery in 2012 s/p short course of coumadin and an unprovoked DVT in the left leg in 09/2012.  Hemophilia workup was negative. She was subsequently started on rivaroxaban.  On 12/18/2013 during syncope work-up she was found to have DVT involving bilateral posterior tibial and left peroneal veins; CTA without PE.  She was started on IV heparin and transitioned to warfarin, Xarelto was discontinued. Aspirin 81 mg was added in 2015 after MI. More recently she was evaluated in the ED on 10/05/2018 for weakness, epigastric discomfort, brown emesis, and near syncope.  She was found to be anemic with Hgb 9.6 and heme positive stool.  Hemoglobin dropped to 7.9 and she was transfused 2 units packed red cells and FFP.  She underwent upper endoscopy by Dr. Loletha Carrow on 10/06/2018 which showed a large amount of fresh and clotted blood in the gastric fundus and in the gastric body as well as an oozing gastric ulcer which was cauterized.  Aspirin and Coumadin were held and she was treated with PPI. Repeat endoscopy on 11/06/18 showed resolution of the gastric ulcer. Hpylori was eradicated. She remained off anticoagulation until 8/11 when she was started on prophylactic dose of apixaban 2.5 mg BID per Dr. Ashby Dawes. She did not resume aspirin   PMH is significant for CAD s/p MI, TIA, OSA, HL. She lives with her spouse. Daughter lives next door, has 3 children in total 1 daughter and 2 sons. 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   Today, she presents alone. She is ambulating with a cane. She fell in her home earlier this month after kicking the door. She denies fatigue, feels "normal." Denies bloody or black stool or hematemesis. Her appetite is normal. Eats fish and chicken, no fried foods or rice lately. She has lost 28 lbs since hospitalization in July intentionally. She feels "itchy" inside her body since 01/2018. Has few small bumps on her right elbow, otherwise no rash. Denies recent fever, chills, cough, chest pain, dyspnea. Left leg is larger than right at baseline for her. Intermittent leg pain she attributes to knee issues.   MEDICAL HISTORY:  Past Medical History:  Diagnosis Date  . Anemia   . CAD in native artery, stent to RCA BMS with MI 2015 11/04/2003   a. inferior STEMI 01/2014 s/p BMS to prox RCA.  [name] DVT (deep venous thrombosis) ([place]) 2003   lower extremity DVT with questionable recurrence in 2005. Treated with 1.5 year course of coumadin. Another admission 2015.  . Endometrial thickening on ultra sound 07/2009   path showing Fragments of benign endocervical and squamous mucosa. No dysplasia or malignancy // Followed by Dr. [name]  . First degree AV block   . Gastric ulcer   . GERD (gastroesophageal reflux disease)   . GI bleed   . Hemorrhoid   . Hyperlipidemia LDL goal <70   . Hypertension   . Insomnia 11/02/2010  . Memory difficulties 11/02/2010  . Mild  dilation of ascending aorta ([place])   . Morbid obesity ([place]) 09/16/2012  . Obstructive sleep apnea 09/23/2010  . Prediabetes DX: 09/2010   HgA1c 6.1  . Pulmonary nodule   . ST elevation myocardial infarction (STEMI) of inferior wall ([place]) 01/24/2014  . Stroke ([place]) 2015  . Syncope 12/18/2013  . TIA (transient ischemic attack) 09/23/2010   09/13/2010: CT, MRI/MRA no acute process. Carotid doppler: no stenosis. 2D-Echo Normal LV function with an EF 55-60%.   [name] UTI (urinary  tract infection) 01/27/2014    SURGICAL HISTORY: Past Surgical History:  Procedure Laterality Date  . COLONOSCOPY W/ POLYPECTOMY  11/01/2007   8 mm rectal adenoma, hemorrhoids  . CORONARY ANGIOPLASTY WITH STENT PLACEMENT  01/24/14   Inf. STEMI, BMS to RCA  . ESOPHAGOGASTRODUODENOSCOPY N/A 10/06/2018   Procedure: ESOPHAGOGASTRODUODENOSCOPY (EGD);  Surgeon: Doran Stabler, MD;  Location: [facility];  Service: Gastroenterology;  Laterality: N/A;  . HOT HEMOSTASIS N/A 10/06/2018   Procedure: HOT HEMOSTASIS (ARGON PLASMA COAGULATION/BICAP);  Surgeon: Doran Stabler, MD;  Location: [facility];  Service: Gastroenterology;  Laterality: N/A;  . KNEE SURGERY  1996   Left knee arthroscopy.  [name] LEFT HEART CATHETERIZATION WITH CORONARY ANGIOGRAM N/A 01/24/2014   Procedure: LEFT HEART CATHETERIZATION WITH CORONARY ANGIOGRAM;  Surgeon: Burnell Blanks, MD;  Location: [facility] CATH LAB;  Service: Cardiovascular;  Laterality: N/A;  . ROTATOR CUFF REPAIR     right.  [name]  10/06/2018   Procedure: [name];  Surgeon: Doran Stabler, MD;  Location: [facility];  Service: Gastroenterology;;  . TUBAL LIGATION      SOCIAL HISTORY: Social History   Socioeconomic History  . Marital status: Married    Spouse name: Not on file  . Number of children: 6  . Years of education: College  . Highest education level: Not on file  Occupational History  . Occupation: Control and instrumentation engineer  . Occupation: [place]    Employer: [employer]  Social Needs  . Financial resource strain: Not on file  . Food insecurity    Worry: Not on file    Inability: Not on file  . Transportation needs    Medical: Not on file    Non-medical: Not on file  Tobacco Use  . Smoking status: Never Smoker  . Smokeless tobacco: Never Used  Substance and Sexual Activity  . Alcohol use: No  . Drug use: No  . Sexual activity: Not on file  Lifestyle  . Physical activity    Days per  week: Not on file    Minutes per session: Not on file  . Stress: Not on file  Relationships  . Social Herbalist on phone: Not on file    Gets together: Not on file    Attends religious service: Not on file    Active member of club or organization: Not on file    Attends meetings of clubs or organizations: Not on file    Relationship status: Not on file  . Intimate partner violence    Fear of current or ex partner: Not on file    Emotionally abused: Not on file    Physically abused: Not on file    Forced sexual activity: Not on file  Other Topics Concern  . Not on file  Social History Narrative   Full Code status.   Insurance: BCBS    FAMILY HISTORY: Family History  Problem Relation Age of Onset  . Diabetes Mother   .  Hyperlipidemia Mother   . Heart disease Mother   . Dementia Mother   . Angina Father   . Heart attack Father   . Thyroid cancer Sister   . Liver cancer Sister   . Heart disease Brother        x 2 in 82s yo  . Alzheimer's disease Maternal Grandmother   . Stroke Neg Hx   . Colon cancer Neg Hx   . Esophageal cancer Neg Hx   . Rectal cancer Neg Hx   . Stomach cancer Neg Hx     ALLERGIES:  has No Known Allergies.  MEDICATIONS:  Current Outpatient Medications  Medication Sig Dispense Refill  . apixaban (ELIQUIS) 2.5 MG TABS tablet Take 1 tablet (2.5 mg total) by mouth 2 (two) times daily. 60 tablet 5  . atorvastatin (LIPITOR) 80 MG tablet Take 1 tablet (80 mg total) by mouth daily. Need appt for further refills, 1st attempt 90 tablet 0  . ferrous sulfate 325 (65 FE) MG tablet Take 1 tablet (325 mg total) by mouth 2 (two) times daily with a meal.  3  . lisinopril-hydrochlorothiazide (ZESTORETIC) 20-25 MG tablet TAKE 1 TABLET BY MOUTH EVERY DAY 90 tablet 3  . metoprolol succinate (TOPROL-XL) 25 MG 24 hr tablet Take 1 tablet (25 mg total) by mouth daily. Need appt for further refills, 1st attempt 90 tablet 0  . Multiple Vitamin (MULTIVITAMIN WITH  MINERALS) TABS Take 1 tablet by mouth every morning.    . nitroGLYCERIN (NITROSTAT) 0.4 MG SL tablet Place 1 tablet (0.4 mg total) under the tongue every 5 (five) minutes x 3 doses as needed for chest pain. 25 tablet 3  . pantoprazole (PROTONIX) 40 MG tablet Take 1 tablet (40 mg total) by mouth daily. 90 tablet 3   No current facility-administered medications for this visit.     PHYSICAL EXAMINATION: ECOG PERFORMANCE STATUS: 2 - Symptomatic, <50% confined to bed  Vitals:   12/06/18 1300  BP: (!) 148/61  Pulse: 69  Resp: 17  Temp: 98.7 F (37.1 C)  SpO2: 98%   Filed Weights   12/06/18 1300  Weight: 246 lb 3.2 oz (111.7 kg)    GENERAL:alert, no distress and comfortable SKIN: small raised rash confined to right elbow EYES: sclera clear LUNGS: distant with normal breathing effort HEART: regular rate & rhythm; mild bilateral lower extremity edema, L>R with chronic venous stasis changes ABDOMEN:abdomen round Musculoskeletal:no cyanosis of digits  PSYCH: alert & oriented x 3 with fluent speech Limited exam for covid19 outbreak   LABORATORY DATA:  I have reviewed the data as listed CBC Latest Ref Rng & Units 10/09/2018 10/08/2018 10/07/2018  WBC 4.0 - 10.5 K/uL 9.7 13.6(H) 14.4(H)  Hemoglobin 12.0 - 15.0 g/dL 8.0(L) 9.0(L) 8.7(L)  Hematocrit 36.0 - 46.0 % 24.1(L) 26.8(L) 26.0(L)  Platelets 150 - 400 K/uL 175 190 195    CMP Latest Ref Rng & Units 10/04/2018 10/18/2017 01/05/2015  Glucose 70 - 99 mg/dL 157(H) 131(H) 100(H)  BUN 8 - 23 mg/dL 32(H) 11 18  Creatinine 0.44 - 1.00 mg/dL 0.65 0.68 0.70  Sodium 135 - 145 mmol/L 134(L) 132(L) 133(L)  Potassium 3.5 - 5.1 mmol/L 3.8 4.2 5.4(H)  Chloride 98 - 111 mmol/L 101 95(L) 99(L)  CO2 22 - 32 mmol/L 25 23 -  Calcium 8.9 - 10.3 mg/dL 8.8(L) 8.9 -  Total Protein 6.5 - 8.1 g/dL 6.0(L) - -  Total Bilirubin 0.3 - 1.2 mg/dL 0.6 - -  Alkaline Phos 38 - 126  U/L 84 - -  AST 15 - 41 U/L 20 - -  ALT 0 - 44 U/L 23 - -     RADIOGRAPHIC STUDIES:  I have personally reviewed the radiological images as listed and agreed with the findings in the report. No results found.  ASSESSMENT & PLAN:  78 yo female with   1. Recurrent DVT 2. HTN CAD, history of recent MI and stroke  3. Obesity, sedentary lifestyle, knee pain  Disposition: We reviewed her medical record in detail. She has had recurrent DVT while on anticoagulation, previously on Xarelto, and most recently developed large upper GI bleed while on Coumadin and aspirin 81 mg. Her bleeding ulcer resolved and she recently started prophylactic dose of Eliquis 2.5 mg BID per PCP. She is tolerating well so far.   We reviewed her risk of DVT vs bleeding risk. Dr. Burr Medico discussed the bleeding risk on coumadin and Eliquis is similar. The draw back is that if she has recurrent GI bleed, there is no immediate antidote for Eliquis. She has been on Eliquis for 1 month, tolerating well so far. She and her family are concerned about COVID19 exposure that would occur with frequent INR checks on coumadin; patient is comfortable on Eliquis for now but is worried about bleeding. Recommend continue prophylactic dose of Eliquis 2.5 mg BID, which would lower her risk of severe bleeding. Due to her ulcer, she will remain off aspirin. Will reevaluate in 6 months with lab and f/u at that time. Recent renal function is normal. She will continue close f/u with PCP and cardiology in the meantime.   I called her daughter Caren Griffins per patient's request and updated her, she agrees with the plan.   Strongly encouraged patient to use walker at home, avoid fall, and try to remain active.   All questions were answered. The patient knows to call the clinic with any problems, questions or concerns.     Alla Feeling, NP 12/06/18 5:06 PM   Addendum I have seen the patient, examined her. I agree with the assessment and and plan and have edited the notes.   I previously saw her for her recurrent DVT, and one episode was  developed when she was on Xarelto. I recommended anticoagulation indefinitely due to her high risk of thrombosis.  She recently developed GI bleeding from ulcer, which has healed.  She has stopped aspirin since then.  She is currently on low-dose Eliquis 2.5 mg twice daily, for DVT prophylaxis.  Given her risk of bleeding and risk of fall, I agree with low dose anticoagulation.  We discussed the pros and cons of Coumadin and DOA, due to current COVID-19 pandemic, her advanced age and transportation issue, I recommend her to continue Eliquis for now, we will see her back in 6 months to see if she wants to switch to Coumadin.  Truitt Merle  12/06/2018

## 2018-12-06 ENCOUNTER — Inpatient Hospital Stay: Payer: Medicare Other | Attending: Nurse Practitioner | Admitting: Nurse Practitioner

## 2018-12-06 ENCOUNTER — Telehealth: Payer: Self-pay | Admitting: Nurse Practitioner

## 2018-12-06 ENCOUNTER — Other Ambulatory Visit: Payer: Self-pay

## 2018-12-06 ENCOUNTER — Encounter: Payer: Self-pay | Admitting: Nurse Practitioner

## 2018-12-06 VITALS — BP 148/61 | HR 69 | Temp 98.7°F | Resp 17 | Ht 63.0 in | Wt 246.2 lb

## 2018-12-06 DIAGNOSIS — I252 Old myocardial infarction: Secondary | ICD-10-CM | POA: Diagnosis not present

## 2018-12-06 DIAGNOSIS — I110 Hypertensive heart disease with heart failure: Secondary | ICD-10-CM | POA: Diagnosis not present

## 2018-12-06 DIAGNOSIS — M25569 Pain in unspecified knee: Secondary | ICD-10-CM | POA: Insufficient documentation

## 2018-12-06 DIAGNOSIS — E669 Obesity, unspecified: Secondary | ICD-10-CM | POA: Insufficient documentation

## 2018-12-06 DIAGNOSIS — I2510 Atherosclerotic heart disease of native coronary artery without angina pectoris: Secondary | ICD-10-CM | POA: Insufficient documentation

## 2018-12-06 DIAGNOSIS — I119 Hypertensive heart disease without heart failure: Secondary | ICD-10-CM | POA: Diagnosis not present

## 2018-12-06 DIAGNOSIS — I82409 Acute embolism and thrombosis of unspecified deep veins of unspecified lower extremity: Secondary | ICD-10-CM | POA: Diagnosis present

## 2018-12-06 DIAGNOSIS — Z8673 Personal history of transient ischemic attack (TIA), and cerebral infarction without residual deficits: Secondary | ICD-10-CM | POA: Insufficient documentation

## 2018-12-06 DIAGNOSIS — K254 Chronic or unspecified gastric ulcer with hemorrhage: Secondary | ICD-10-CM

## 2018-12-06 DIAGNOSIS — Z86718 Personal history of other venous thrombosis and embolism: Secondary | ICD-10-CM

## 2018-12-06 NOTE — Telephone Encounter (Signed)
Scheduled appt per 9/2 los.  Spoke with patient daughter and she is aware of the appt date and time.

## 2018-12-13 ENCOUNTER — Other Ambulatory Visit: Payer: Self-pay

## 2018-12-13 ENCOUNTER — Encounter (HOSPITAL_COMMUNITY): Payer: Self-pay | Admitting: Emergency Medicine

## 2018-12-13 ENCOUNTER — Observation Stay (HOSPITAL_COMMUNITY)
Admission: EM | Admit: 2018-12-13 | Discharge: 2018-12-15 | Disposition: A | Discharge status: Home / Self Care | Payer: Medicare Other | Attending: Internal Medicine | Admitting: Internal Medicine

## 2018-12-13 DIAGNOSIS — G4700 Insomnia, unspecified: Secondary | ICD-10-CM | POA: Insufficient documentation

## 2018-12-13 DIAGNOSIS — Z6841 Body Mass Index (BMI) 40.0 and over, adult: Secondary | ICD-10-CM | POA: Insufficient documentation

## 2018-12-13 DIAGNOSIS — Z8673 Personal history of transient ischemic attack (TIA), and cerebral infarction without residual deficits: Secondary | ICD-10-CM | POA: Insufficient documentation

## 2018-12-13 DIAGNOSIS — D72829 Elevated white blood cell count, unspecified: Secondary | ICD-10-CM | POA: Diagnosis not present

## 2018-12-13 DIAGNOSIS — E871 Hypo-osmolality and hyponatremia: Principal | ICD-10-CM

## 2018-12-13 DIAGNOSIS — I252 Old myocardial infarction: Secondary | ICD-10-CM | POA: Insufficient documentation

## 2018-12-13 DIAGNOSIS — Z7901 Long term (current) use of anticoagulants: Secondary | ICD-10-CM | POA: Diagnosis not present

## 2018-12-13 DIAGNOSIS — I82409 Acute embolism and thrombosis of unspecified deep veins of unspecified lower extremity: Secondary | ICD-10-CM | POA: Diagnosis present

## 2018-12-13 DIAGNOSIS — R2681 Unsteadiness on feet: Secondary | ICD-10-CM | POA: Diagnosis not present

## 2018-12-13 DIAGNOSIS — E785 Hyperlipidemia, unspecified: Secondary | ICD-10-CM | POA: Insufficient documentation

## 2018-12-13 DIAGNOSIS — Z20828 Contact with and (suspected) exposure to other viral communicable diseases: Secondary | ICD-10-CM | POA: Diagnosis not present

## 2018-12-13 DIAGNOSIS — Z8249 Family history of ischemic heart disease and other diseases of the circulatory system: Secondary | ICD-10-CM | POA: Insufficient documentation

## 2018-12-13 DIAGNOSIS — K219 Gastro-esophageal reflux disease without esophagitis: Secondary | ICD-10-CM | POA: Diagnosis not present

## 2018-12-13 DIAGNOSIS — Z86718 Personal history of other venous thrombosis and embolism: Secondary | ICD-10-CM | POA: Insufficient documentation

## 2018-12-13 DIAGNOSIS — R55 Syncope and collapse: Secondary | ICD-10-CM | POA: Diagnosis present

## 2018-12-13 DIAGNOSIS — I440 Atrioventricular block, first degree: Secondary | ICD-10-CM | POA: Insufficient documentation

## 2018-12-13 DIAGNOSIS — E876 Hypokalemia: Secondary | ICD-10-CM | POA: Diagnosis not present

## 2018-12-13 DIAGNOSIS — Z79899 Other long term (current) drug therapy: Secondary | ICD-10-CM | POA: Insufficient documentation

## 2018-12-13 DIAGNOSIS — Z955 Presence of coronary angioplasty implant and graft: Secondary | ICD-10-CM | POA: Diagnosis not present

## 2018-12-13 DIAGNOSIS — I10 Essential (primary) hypertension: Secondary | ICD-10-CM | POA: Insufficient documentation

## 2018-12-13 DIAGNOSIS — I2510 Atherosclerotic heart disease of native coronary artery without angina pectoris: Secondary | ICD-10-CM | POA: Insufficient documentation

## 2018-12-13 DIAGNOSIS — G4733 Obstructive sleep apnea (adult) (pediatric): Secondary | ICD-10-CM | POA: Diagnosis not present

## 2018-12-13 MED ORDER — SODIUM CHLORIDE 0.9 % IV SOLN
Freq: Once | INTRAVENOUS | Status: AC
  Administered 2018-12-14: via INTRAVENOUS

## 2018-12-13 NOTE — ED Triage Notes (Signed)
Pt. Came in via EMS from home with a near syncope episode. Pt was sitting and felt hot and her husband got hjer up to go to the bathroom, she couldn't get out of the chair because she was so weak. Home BP: 72/30. EMS arrival BP:116/80. HR: 50, CBG:145. Reports back pain like when she had her AAA repair. History of NSTEMI. On beta Blockers, No chest pain, SOB, N/V.

## 2018-12-13 NOTE — ED Provider Notes (Signed)
[facility] EMERGENCY DEPARTMENT Provider Note   CSN: [id number] Arrival date & time: 12/13/18  2334     History   Chief Complaint Chief Complaint  Patient presents with  . Near Syncope    HPI Shirley [name] is a 78 y.o. female.     78 y/o female with hx of CAD with inferior STEMI 01/2014 s/p BMS to prox RCA, recurrent DVT on Eliquis, pre-DM, HTN, HLD, CVA, OSA presents to the ED following a near syncopal event at home. Patient had gotten out of the shower tonight when she began to feel flush and nauseated. Progressed to feeling lightheaded with near syncope, falling backward into a rocking chair.  Patient was unable to get back up out of the chair as she felt generally weak. Was in the presence of daughter and husband during this incident. Never fully lost consciousness.  No preceding or associated chest pain, SOB, extremity numbness, paresthesias, fevers. Complaining of some back and gluteal pain at this time, which daughter attributes to patient working outside in the yard for the past 2 days. Daughter denies any c/o back pain prior to near syncopal event tonight.  Hx of UGI bleed in July requiring admission. Underwent EGD and resumed chronic anticoagulation ~3 weeks ago. Has noticed some blackish stool since discharge, but was told by GI that this is to be expected while she continues iron supplementation.  Triage note references past AAA repair - this history was received by EMS and is INCORRECT. Daughter verifies no hx of AAA repair. Patient does have hx of tortuous and mildly dilated ascending aorta being monitored by cardiology. Scheduled for routine screening CTA Aorta at the end of October.   Near Syncope    Past Medical History:  Diagnosis Date  . Anemia   . CAD in native artery, stent to RCA BMS with MI 2015 11/04/2003   a. inferior STEMI 01/2014 s/p BMS to prox RCA.  [name] DVT (deep venous thrombosis) ([place]) 2003   lower extremity DVT with  questionable recurrence in 2005. Treated with 1.5 year course of coumadin. Another admission 2015.  . Endometrial thickening on ultra sound 07/2009   path showing Fragments of benign endocervical and squamous mucosa. No dysplasia or malignancy // Followed by Dr. [name]  . First degree AV block   . Gastric ulcer   . GERD (gastroesophageal reflux disease)   . GI bleed   . Hemorrhoid   . Hyperlipidemia LDL goal <70   . Hypertension   . Insomnia 11/02/2010  . Memory difficulties 11/02/2010  . Mild dilation of ascending aorta (HCC)   . Morbid obesity ([place]) 09/16/2012  . Obstructive sleep apnea 09/23/2010  . Prediabetes DX: 09/2010   HgA1c 6.1  . Pulmonary nodule   . ST elevation myocardial infarction (STEMI) of inferior wall ([place]) 01/24/2014  . Stroke ([place]) 2015  . Syncope 12/18/2013  . TIA (transient ischemic attack) 09/23/2010   09/13/2010: CT, MRI/MRA no acute process. Carotid doppler: no stenosis. 2D-Echo Normal LV function with an EF 55-60%.   [name] UTI (urinary tract infection) 01/27/2014    Patient Active Problem List   Diagnosis Date Noted  . GI bleed 10/05/2018  . Asymptomatic bacteriuria 10/05/2018  . History of CVA (cerebrovascular accident) 10/05/2018  . Acute upper GI bleed 10/05/2018  . Chest pain 07/03/2014  . Dizziness 02/01/2014  . Chest wall pain 02/01/2014  . UTI (urinary tract infection) 01/27/2014  . ST elevation myocardial infarction (STEMI) of inferior wall ([place])  01/24/2014  . DVT (deep venous thrombosis), unspecified laterality 12/24/2013  . Morbid obesity ([place]) 09/16/2012  . Need for diphtheria-tetanus-pertussis (Tdap) vaccine, adult/adolescent 11/02/2010  . Insomnia 11/02/2010  . Memory difficulties 11/02/2010  . TIA (transient ischemic attack) 09/23/2010  . Hypertension 09/23/2010  . Hyperlipidemia 09/23/2010  . Hyperglycemia 09/23/2010  . Obstructive sleep apnea 09/23/2010  . Vaginal bleeding problems 09/23/2010  . DVT (deep venous thrombosis) ([place])    . GERD (gastroesophageal reflux disease) 07/07/2010  . Endometrial thickening on ultra sound 07/04/2009  . CAD in native artery, stent to RCA BMS with MI 2015 11/04/2003    Past Surgical History:  Procedure Laterality Date  . COLONOSCOPY W/ POLYPECTOMY  11/01/2007   8 mm rectal adenoma, hemorrhoids  . CORONARY ANGIOPLASTY WITH STENT PLACEMENT  01/24/14   Inf. STEMI, BMS to RCA  . ESOPHAGOGASTRODUODENOSCOPY N/A 10/06/2018   Procedure: ESOPHAGOGASTRODUODENOSCOPY (EGD);  Surgeon: Doran Stabler, MD;  Location: [facility];  Service: Gastroenterology;  Laterality: N/A;  . HOT HEMOSTASIS N/A 10/06/2018   Procedure: HOT HEMOSTASIS (ARGON PLASMA COAGULATION/BICAP);  Surgeon: Doran Stabler, MD;  Location: [place];  Service: Gastroenterology;  Laterality: N/A;  . KNEE SURGERY  1996   Left knee arthroscopy.  [name] LEFT HEART CATHETERIZATION WITH CORONARY ANGIOGRAM N/A 01/24/2014   Procedure: LEFT HEART CATHETERIZATION WITH CORONARY ANGIOGRAM;  Surgeon: Burnell Blanks, MD;  Location[facility] CATH LAB;  Service: Cardiovascular;  Laterality: N/A;  . ROTATOR CUFF REPAIR     right.  [name]  10/06/2018   Procedure: [name];  Surgeon: Doran Stabler, MD;  Location: [facility] ENDOSCOPY;  Service: Gastroenterology;;  . TUBAL LIGATION       OB History   No obstetric history on file.      Home Medications    Prior to Admission medications   Medication Sig Start Date End Date Taking? Authorizing Provider  apixaban (ELIQUIS) 2.5 MG TABS tablet Take 1 tablet (2.5 mg total) by mouth 2 (two) times daily. 11/14/18   Burnell Blanks, MD  atorvastatin (LIPITOR) 80 MG tablet Take 1 tablet (80 mg total) by mouth daily. Need appt for further refills, 1st attempt 10/17/18   Burnell Blanks, MD  ferrous sulfate 325 (65 FE) MG tablet Take 1 tablet (325 mg total) by mouth 2 (two) times daily with a meal. 10/09/18   Georgette Shell, MD  lisinopril-hydrochlorothiazide  (ZESTORETIC) 20-25 MG tablet TAKE 1 TABLET BY MOUTH EVERY DAY 10/17/18   Burnell Blanks, MD  metoprolol succinate (TOPROL-XL) 25 MG 24 hr tablet Take 1 tablet (25 mg total) by mouth daily. Need appt for further refills, 1st attempt 10/17/18   Burnell Blanks, MD  Multiple Vitamin (MULTIVITAMIN WITH MINERALS) TABS Take 1 tablet by mouth every morning.    [provider]  nitroGLYCERIN (NITROSTAT) 0.4 MG SL tablet Place 1 tablet (0.4 mg total) under the tongue every 5 (five) minutes x 3 doses as needed for chest pain. 09/07/17   Belva Crome, MD  pantoprazole (PROTONIX) 40 MG tablet Take 1 tablet (40 mg total) by mouth daily. 11/06/18   Doran Stabler, MD    Family History Family History  Problem Relation Age of Onset  . Diabetes Mother   . Hyperlipidemia Mother   . Heart disease Mother   . Dementia Mother   . Angina Father   . Heart attack Father   . Thyroid cancer Sister   . Liver cancer Sister   .  Heart disease Brother        x 2 in 40s yo  . Alzheimer's disease Maternal Grandmother   . Stroke Neg Hx   . Colon cancer Neg Hx   . Esophageal cancer Neg Hx   . Rectal cancer Neg Hx   . Stomach cancer Neg Hx     Social History Social History   Tobacco Use  . Smoking status: Never Smoker  . Smokeless tobacco: Never Used  Substance Use Topics  . Alcohol use: No  . Drug use: No     Allergies   Patient has no known allergies.   Review of Systems Review of Systems  Cardiovascular: Positive for near-syncope.  Ten systems reviewed and are negative for acute change, except as noted in the HPI.     Physical Exam Updated Vital Signs BP 126/62   Pulse 64   Temp 97.7 F (36.5 C) (Oral)   Resp 18   Ht 5\' 3"  (1.6 m)   Wt 111 kg   SpO2 98%   BMI 43.35 kg/m   Physical Exam Vitals signs and nursing note reviewed.  Constitutional:      General: She is not in acute distress.    Appearance: She is well-developed. She is not diaphoretic.      Comments: Obese female, in NAD  HENT:     Head: Normocephalic and atraumatic.  Eyes:     General: No scleral icterus.    Conjunctiva/sclera: Conjunctivae normal.  Neck:     Musculoskeletal: Normal range of motion.  Cardiovascular:     Rate and Rhythm: Normal rate and regular rhythm.     Pulses: Normal pulses.  Pulmonary:     Effort: Pulmonary effort is normal. No respiratory distress.     Breath sounds: No stridor. No wheezing, rhonchi or rales.     Comments: Lungs CTAB. Respirations even and unlabored. Abdominal:     General: There is no distension.     Palpations: There is no mass.     Comments: Mild epigastric TTP.  Genitourinary:    Comments: Stool in pants which is brownish/green in color. No melena or hematochezia. Musculoskeletal: Normal range of motion.     Comments: 1+ pitting edema BLE  Skin:    General: Skin is warm and dry.     Coloration: Skin is not pale.     Findings: No erythema.     Comments: Contact dermatitis to b/l hand.  Neurological:     General: No focal deficit present.     Mental Status: She is alert and oriented to person, place, and time.     Coordination: Coordination normal.     Comments: GCS 15. Answers questions appropriately and follows commands. Moving all extremities spontaneously.  Psychiatric:        Behavior: Behavior normal.      ED Treatments / Results  Labs (all labs ordered are listed, but only abnormal results are displayed) Labs Reviewed  CBC WITH DIFFERENTIAL/PLATELET - Abnormal; Notable for the following components:      Result Value   WBC 12.9 (*)    Neutro Abs 8.4 (*)    All other components within normal limits  BASIC METABOLIC PANEL - Abnormal; Notable for the following components:   Sodium 122 (*)    Potassium 3.4 (*)    Chloride 90 (*)    CO2 20 (*)    Glucose, Bld 136 (*)    Calcium 8.7 (*)    All other components within normal  limits  CBG MONITORING, ED - Abnormal; Notable for the following components:    Glucose-Capillary 147 (*)    All other components within normal limits  SARS CORONAVIRUS 2 (TAT 6-24 HRS)  MAGNESIUM  POC OCCULT BLOOD, ED    EKG EKG Interpretation  Date/Time:  Thursday December 14 2018 01:30:41 EDT Ventricular Rate:  65 PR Interval:    QRS Duration: 132 QT Interval:  401 QTC Calculation: 417 R Axis:   -55 Text Interpretation:  Sinus or ectopic atrial rhythm Prolonged PR interval Nonspecific IVCD with LAD No significant change since last tracing Confirmed by Addison Lank [phone]) on 12/14/2018 1:54:45 AM   Radiology No results found.  Procedures Procedures (including critical care time)  Medications Ordered in ED Medications  potassium chloride 10 mEq in 100 mL IVPB (10 mEq Intravenous New Bag/Given 12/14/18 0155)  0.9 %  sodium chloride infusion ( Intravenous New Bag/Given 12/14/18 0009)  potassium chloride SA (K-DUR) CR tablet 40 mEq (40 mEq Oral Given 12/14/18 0152)     Initial Impression / Assessment and Plan / ED Course  I have reviewed the triage vital signs and the nursing notes.  Pertinent labs & imaging results that were available during my care of the patient were reviewed by me and considered in my medical decision making (see chart for details).        78 year old female presents to the emergency department following a near syncopal event.  Presented for a similar episode in July which was provoked by a GI bleed.  Her hemoglobin today is stable and her Hemoccult is negative.  The patient was found to have low sodium of 122 today.  Daughter states that she has been working outside in the yard for the past 2 days.  There may be a dehydration component leading up to her near syncope tonight.  Patient started on IV fluids while in the emergency department.  Plan for admission for correction of electrolyte derangements.  Case discussed with Dr. Marlowe Sax who will admit.   Final Clinical Impressions(s) / ED Diagnoses   Final diagnoses:  Near  syncope  Hyponatremia    ED Discharge Orders    None       Antonietta Breach, PA-C 12/14/18 0202    Fatima Blank, MD 12/14/18 [phone]

## 2018-12-14 ENCOUNTER — Observation Stay (HOSPITAL_COMMUNITY): Payer: Medicare Other

## 2018-12-14 DIAGNOSIS — D72829 Elevated white blood cell count, unspecified: Secondary | ICD-10-CM | POA: Diagnosis present

## 2018-12-14 DIAGNOSIS — R55 Syncope and collapse: Secondary | ICD-10-CM

## 2018-12-14 DIAGNOSIS — E871 Hypo-osmolality and hyponatremia: Secondary | ICD-10-CM | POA: Diagnosis not present

## 2018-12-14 DIAGNOSIS — E876 Hypokalemia: Secondary | ICD-10-CM

## 2018-12-14 HISTORY — DX: Syncope and collapse: R55

## 2018-12-14 LAB — URINALYSIS, ROUTINE W REFLEX MICROSCOPIC
Bilirubin Urine: NEGATIVE
Glucose, UA: NEGATIVE mg/dL
Ketones, ur: NEGATIVE mg/dL
Nitrite: NEGATIVE
Protein, ur: NEGATIVE mg/dL
Specific Gravity, Urine: 1.002 — ABNORMAL LOW (ref 1.005–1.030)
pH: 7 (ref 5.0–8.0)

## 2018-12-14 LAB — BASIC METABOLIC PANEL
Anion gap: 11 (ref 5–15)
Anion gap: 12 (ref 5–15)
Anion gap: 7 (ref 5–15)
Anion gap: 8 (ref 5–15)
BUN: 6 mg/dL — ABNORMAL LOW (ref 8–23)
BUN: 7 mg/dL — ABNORMAL LOW (ref 8–23)
BUN: 7 mg/dL — ABNORMAL LOW (ref 8–23)
BUN: 9 mg/dL (ref 8–23)
CO2: 20 mmol/L — ABNORMAL LOW (ref 22–32)
CO2: 21 mmol/L — ABNORMAL LOW (ref 22–32)
CO2: 21 mmol/L — ABNORMAL LOW (ref 22–32)
CO2: 25 mmol/L (ref 22–32)
Calcium: 8.1 mg/dL — ABNORMAL LOW (ref 8.9–10.3)
Calcium: 8.4 mg/dL — ABNORMAL LOW (ref 8.9–10.3)
Calcium: 8.4 mg/dL — ABNORMAL LOW (ref 8.9–10.3)
Calcium: 8.7 mg/dL — ABNORMAL LOW (ref 8.9–10.3)
Chloride: 90 mmol/L — ABNORMAL LOW (ref 98–111)
Chloride: 94 mmol/L — ABNORMAL LOW (ref 98–111)
Chloride: 98 mmol/L (ref 98–111)
Chloride: 98 mmol/L (ref 98–111)
Creatinine, Ser: 0.49 mg/dL (ref 0.44–1.00)
Creatinine, Ser: 0.54 mg/dL (ref 0.44–1.00)
Creatinine, Ser: 0.56 mg/dL (ref 0.44–1.00)
Creatinine, Ser: 0.57 mg/dL (ref 0.44–1.00)
GFR calc Af Amer: >60 mL/min (ref >60)
GFR calc Af Amer: >60 mL/min (ref >60)
GFR calc Af Amer: >60 mL/min (ref >60)
GFR calc Af Amer: >60 mL/min (ref >60)
GFR calc non Af Amer: >60 mL/min (ref >60)
GFR calc non Af Amer: >60 mL/min (ref >60)
GFR calc non Af Amer: >60 mL/min (ref >60)
GFR calc non Af Amer: >60 mL/min (ref >60)
Glucose, Bld: 130 mg/dL — ABNORMAL HIGH (ref 70–99)
Glucose, Bld: 133 mg/dL — ABNORMAL HIGH (ref 70–99)
Glucose, Bld: 136 mg/dL — ABNORMAL HIGH (ref 70–99)
Glucose, Bld: 161 mg/dL — ABNORMAL HIGH (ref 70–99)
Potassium: 3.2 mmol/L — ABNORMAL LOW (ref 3.5–5.1)
Potassium: 3.4 mmol/L — ABNORMAL LOW (ref 3.5–5.1)
Potassium: 3.5 mmol/L (ref 3.5–5.1)
Potassium: 3.6 mmol/L (ref 3.5–5.1)
Sodium: 122 mmol/L — ABNORMAL LOW (ref 135–145)
Sodium: 126 mmol/L — ABNORMAL LOW (ref 135–145)
Sodium: 126 mmol/L — ABNORMAL LOW (ref 135–145)
Sodium: 131 mmol/L — ABNORMAL LOW (ref 135–145)

## 2018-12-14 LAB — CBC
HCT: 32.9 % — ABNORMAL LOW (ref 36.0–46.0)
Hemoglobin: 10.8 g/dL — ABNORMAL LOW (ref 12.0–15.0)
MCH: 26.9 pg (ref 26.0–34.0)
MCHC: 32.8 g/dL (ref 30.0–36.0)
MCV: 82 fL (ref 80.0–100.0)
Platelets: 262 10*3/uL (ref 150–400)
RBC: 4.01 MIL/uL (ref 3.87–5.11)
RDW: 13 % (ref 11.5–15.5)
WBC: 12.4 10*3/uL — ABNORMAL HIGH (ref 4.0–10.5)
nRBC: 0 % (ref 0.0–0.2)

## 2018-12-14 LAB — CBC WITH DIFFERENTIAL/PLATELET
Abs Immature Granulocytes: 0.04 10*3/uL (ref 0.00–0.07)
Basophils Absolute: 0.1 10*3/uL (ref 0.0–0.1)
Basophils Relative: 1 %
Eosinophils Absolute: 0.3 10*3/uL (ref 0.0–0.5)
Eosinophils Relative: 2 %
HCT: 37 % (ref 36.0–46.0)
Hemoglobin: 12.5 g/dL (ref 12.0–15.0)
Immature Granulocytes: 0 %
Lymphocytes Relative: 24 %
Lymphs Abs: 3.1 10*3/uL (ref 0.7–4.0)
MCH: 27.9 pg (ref 26.0–34.0)
MCHC: 33.8 g/dL (ref 30.0–36.0)
MCV: 82.6 fL (ref 80.0–100.0)
Monocytes Absolute: 1 10*3/uL (ref 0.1–1.0)
Monocytes Relative: 8 %
Neutro Abs: 8.4 10*3/uL — ABNORMAL HIGH (ref 1.7–7.7)
Neutrophils Relative %: 65 %
Platelets: 271 10*3/uL (ref 150–400)
RBC: 4.48 MIL/uL (ref 3.87–5.11)
RDW: 13.1 % (ref 11.5–15.5)
WBC: 12.9 10*3/uL — ABNORMAL HIGH (ref 4.0–10.5)
nRBC: 0 % (ref 0.0–0.2)

## 2018-12-14 LAB — MAGNESIUM: Magnesium: 1.8 mg/dL (ref 1.7–2.4)

## 2018-12-14 LAB — TROPONIN I (HIGH SENSITIVITY): Troponin I (High Sensitivity): 5 ng/L (ref <18)

## 2018-12-14 LAB — TSH: TSH: 0.94 u[IU]/mL (ref 0.350–4.500)

## 2018-12-14 LAB — OSMOLALITY, URINE: Osmolality, Ur: 126 mOsm/kg — ABNORMAL LOW (ref 300–900)

## 2018-12-14 LAB — CBG MONITORING, ED: Glucose-Capillary: 147 mg/dL — ABNORMAL HIGH (ref 70–99)

## 2018-12-14 LAB — T4, FREE: Free T4: 1.34 ng/dL — ABNORMAL HIGH (ref 0.61–1.12)

## 2018-12-14 LAB — OSMOLALITY: Osmolality: 262 mOsm/kg — ABNORMAL LOW (ref 275–295)

## 2018-12-14 LAB — SODIUM, URINE, RANDOM: Sodium, Ur: 23 mmol/L

## 2018-12-14 LAB — POC OCCULT BLOOD, ED: Fecal Occult Bld: NEGATIVE

## 2018-12-14 LAB — SARS CORONAVIRUS 2 (TAT 6-24 HRS): SARS Coronavirus 2: NEGATIVE

## 2018-12-14 MED ORDER — POTASSIUM CHLORIDE CRYS ER 20 MEQ PO TBCR
40.0000 meq | EXTENDED_RELEASE_TABLET | Freq: Once | ORAL | Status: AC
  Administered 2018-12-14: 02:00:00 40 meq via ORAL
  Filled 2018-12-14: qty 2

## 2018-12-14 MED ORDER — POTASSIUM CHLORIDE 10 MEQ/100ML IV SOLN
10.0000 meq | Freq: Once | INTRAVENOUS | Status: AC
  Administered 2018-12-14: 10 meq via INTRAVENOUS
  Filled 2018-12-14: qty 100

## 2018-12-14 MED ORDER — ACETAMINOPHEN 325 MG PO TABS: 650.0000 mg | ORAL_TABLET | Freq: Four times a day (QID) | ORAL | Status: DC | PRN

## 2018-12-14 MED ORDER — METOPROLOL SUCCINATE ER 25 MG PO TB24
25.0000 mg | ORAL_TABLET | Freq: Every day | ORAL | Status: DC
  Administered 2018-12-14 – 2018-12-15 (×2): 25 mg via ORAL
  Filled 2018-12-14 (×2): qty 1

## 2018-12-14 MED ORDER — POTASSIUM CHLORIDE CRYS ER 20 MEQ PO TBCR
40.0000 meq | EXTENDED_RELEASE_TABLET | Freq: Once | ORAL | Status: AC
  Administered 2018-12-14: 40 meq via ORAL
  Filled 2018-12-14: qty 2

## 2018-12-14 MED ORDER — SODIUM CHLORIDE 0.9 % IV SOLN
INTRAVENOUS | Status: DC
  Administered 2018-12-14 – 2018-12-15 (×3): via INTRAVENOUS

## 2018-12-14 MED ORDER — PANTOPRAZOLE SODIUM 40 MG PO TBEC
40.0000 mg | DELAYED_RELEASE_TABLET | Freq: Every day | ORAL | Status: DC
  Administered 2018-12-14 – 2018-12-15 (×2): 40 mg via ORAL
  Filled 2018-12-14 (×2): qty 1

## 2018-12-14 MED ORDER — LISINOPRIL 10 MG PO TABS
20.0000 mg | ORAL_TABLET | Freq: Every day | ORAL | Status: DC
  Administered 2018-12-14 – 2018-12-15 (×2): 20 mg via ORAL
  Filled 2018-12-14: qty 1
  Filled 2018-12-14: qty 2

## 2018-12-14 MED ORDER — ACETAMINOPHEN 650 MG RE SUPP: 650.0000 mg | Freq: Four times a day (QID) | RECTAL | Status: DC | PRN

## 2018-12-14 MED ORDER — APIXABAN 2.5 MG PO TABS
2.5000 mg | ORAL_TABLET | Freq: Two times a day (BID) | ORAL | Status: DC
  Administered 2018-12-14 – 2018-12-15 (×3): 2.5 mg via ORAL
  Filled 2018-12-14 (×4): qty 1

## 2018-12-14 NOTE — ED Notes (Signed)
Tele   Breakfast ordered  

## 2018-12-14 NOTE — H&P (Signed)
History and Physical    MARKELLE ZARAGOZA [id number] DOB: 03/21/1941 DOA: 12/13/2018  PCP: Merrilee Seashore, MD Patient coming from: Home  Chief Complaint: Near syncope  HPI: Shirley [name] is a 78 y.o. female with medical history significant of CAD/ STEMI status post BMS to prox RCA in 2015, recurrent DVT followed by hematology and on indefinite anticoagulation, first-degree AV block, GERD, gastric ulcer, GI bleed, hemorrhoids, hypertension, hyperlipidemia, memory problems, morbid obesity, OSA, prediabetes, CVA presenting to the ED after having a near syncopal event at home.  Patient states she was stacking items on a shelf at home and all of a sudden felt nauseous, hot, and lightheaded.  She then sat down in a chair.  Daughter at bedside states after patient sat down in the chair patient's husband noticed that her arms were trembling.  She has no prior history of seizures.  Family did not notice any incontinence/ tongue biting.  Patient denies having any chest pain, palpitations, or shortness of breath at that time.  She did not lose consciousness.  Daughter states for the past 2 days patient has been doing a lot of outdoor yard work and has not been keeping herself hydrated.  She takes several blood pressure medications at home including metoprolol, lisinopril, and hydrochlorothiazide.  She previously had a GI bleed in July and since then has been eating small portions only.  No abdominal pain, vomiting, or diarrhea.  No dysuria.  No cough.  No fevers or chills.  No recent sick contacts.  ED Course: Hemodynamically stable.  White count 12.9.  Corrected sodium 123, potassium 3.4, chloride 90, bicarb 20, anion gap 12.  Blood glucose 136.  Creatinine 0.5, at baseline.  Magnesium level pending.  Hemoglobin 12.5, FOBT negative.  SARS-CoV-2 test pending.  EKG without acute changes.  Patient received potassium supplementation in the ED.  Review of Systems:  All systems reviewed and apart from  history of presenting illness, are negative.  Past Medical History:  Diagnosis Date  . Anemia   . CAD in native artery, stent to RCA BMS with MI 2015 11/04/2003   a. inferior STEMI 01/2014 s/p BMS to prox RCA.  [name] DVT (deep venous thrombosis) ([place]) 2003   lower extremity DVT with questionable recurrence in 2005. Treated with 1.5 year course of coumadin. Another admission 2015.  . Endometrial thickening on ultra sound 07/2009   path showing Fragments of benign endocervical and squamous mucosa. No dysplasia or malignancy // Followed by Dr. [name]  . First degree AV block   . Gastric ulcer   . GERD (gastroesophageal reflux disease)   . GI bleed   . Hemorrhoid   . Hyperlipidemia LDL goal <70   . Hypertension   . Insomnia 11/02/2010  . Memory difficulties 11/02/2010  . Mild dilation of ascending aorta (HCC)   . Morbid obesity ([place]) 09/16/2012  . Obstructive sleep apnea 09/23/2010  . Prediabetes DX: 09/2010   HgA1c 6.1  . Pulmonary nodule   . ST elevation myocardial infarction (STEMI) of inferior wall ([place]) 01/24/2014  . Stroke ([place]) 2015  . Syncope 12/18/2013  . TIA (transient ischemic attack) 09/23/2010   09/13/2010: CT, MRI/MRA no acute process. Carotid doppler: no stenosis. 2D-Echo Normal LV function with an EF 55-60%.   [name] UTI (urinary tract infection) 01/27/2014    Past Surgical History:  Procedure Laterality Date  . COLONOSCOPY W/ POLYPECTOMY  11/01/2007   8 mm rectal adenoma, hemorrhoids  . CORONARY ANGIOPLASTY WITH STENT PLACEMENT  01/24/14  Inf. STEMI, BMS to RCA  . ESOPHAGOGASTRODUODENOSCOPY N/A 10/06/2018   Procedure: ESOPHAGOGASTRODUODENOSCOPY (EGD);  Surgeon: Doran Stabler, MD;  Location: [place];  Service: Gastroenterology;  Laterality: N/A;  . HOT HEMOSTASIS N/A 10/06/2018   Procedure: HOT HEMOSTASIS (ARGON PLASMA COAGULATION/BICAP);  Surgeon: Doran Stabler, MD;  Location: [place];  Service: Gastroenterology;  Laterality: N/A;  . KNEE SURGERY  1996    Left knee arthroscopy.  [name] LEFT HEART CATHETERIZATION WITH CORONARY ANGIOGRAM N/A 01/24/2014   Procedure: LEFT HEART CATHETERIZATION WITH CORONARY ANGIOGRAM;  Surgeon: Burnell Blanks, MD;  Location: [facility] CATH LAB;  Service: Cardiovascular;  Laterality: N/A;  . ROTATOR CUFF REPAIR     right.  [name]  10/06/2018   Procedure: [name];  Surgeon: Doran Stabler, MD;  Location: [place];  Service: Gastroenterology;;  . TUBAL LIGATION       reports that she has never smoked. She has never used smokeless tobacco. She reports that she does not drink alcohol or use drugs.  No Known Allergies  Family History  Problem Relation Age of Onset  . Diabetes Mother   . Hyperlipidemia Mother   . Heart disease Mother   . Dementia Mother   . Angina Father   . Heart attack Father   . Thyroid cancer Sister   . Liver cancer Sister   . Heart disease Brother        x 2 in 9s yo  . Alzheimer's disease Maternal Grandmother   . Stroke Neg Hx   . Colon cancer Neg Hx   . Esophageal cancer Neg Hx   . Rectal cancer Neg Hx   . Stomach cancer Neg Hx     Prior to Admission medications   Medication Sig Start Date End Date Taking? Authorizing Provider  apixaban (ELIQUIS) 2.5 MG TABS tablet Take 1 tablet (2.5 mg total) by mouth 2 (two) times daily. 11/14/18   Burnell Blanks, MD  atorvastatin (LIPITOR) 80 MG tablet Take 1 tablet (80 mg total) by mouth daily. Need appt for further refills, 1st attempt 10/17/18   Burnell Blanks, MD  ferrous sulfate 325 (65 FE) MG tablet Take 1 tablet (325 mg total) by mouth 2 (two) times daily with a meal. 10/09/18   Georgette Shell, MD  lisinopril-hydrochlorothiazide (ZESTORETIC) 20-25 MG tablet TAKE 1 TABLET BY MOUTH EVERY DAY 10/17/18   Burnell Blanks, MD  metoprolol succinate (TOPROL-XL) 25 MG 24 hr tablet Take 1 tablet (25 mg total) by mouth daily. Need appt for further refills, 1st attempt 10/17/18   Burnell Blanks, MD   Multiple Vitamin (MULTIVITAMIN WITH MINERALS) TABS Take 1 tablet by mouth every morning.    [provider]  nitroGLYCERIN (NITROSTAT) 0.4 MG SL tablet Place 1 tablet (0.4 mg total) under the tongue every 5 (five) minutes x 3 doses as needed for chest pain. 09/07/17   Belva Crome, MD  pantoprazole (PROTONIX) 40 MG tablet Take 1 tablet (40 mg total) by mouth daily. 11/06/18   Doran Stabler, MD    Physical Exam: Vitals:   12/14/18 0145 12/14/18 0200 12/14/18 0245 12/14/18 0300  BP: 126/62 131/64 126/65 134/63  Pulse: 64 67 63 60  Resp: 18 18 19 15   Temp:      TempSrc:      SpO2: 98% 99% 100% 99%  Weight:      Height:        Physical Exam  Constitutional: She is oriented  to person, place, and time. She appears well-developed and well-nourished. No distress.  HENT:  Head: Normocephalic.  Eyes: Right eye exhibits no discharge. Left eye exhibits no discharge.  Neck: Neck supple.  Cardiovascular: Normal rate, regular rhythm and intact distal pulses.  Pulmonary/Chest: Effort normal and breath sounds normal. No respiratory distress. She has no wheezes. She has no rales.  Abdominal: Soft. Bowel sounds are normal. She exhibits no distension. There is no abdominal tenderness. There is no guarding.  Musculoskeletal:     Comments: +2 pitting edema of bilateral lower extremities (per patient her edema is chronic and currently much improved)  Neurological: She is alert and oriented to person, place, and time.  Skin: Skin is warm and dry. She is not diaphoretic.     Labs on Admission: I have personally reviewed following labs and imaging studies  CBC: Recent Labs  Lab 12/13/18 2341  WBC 12.9*  NEUTROABS 8.4*  HGB 12.5  HCT 37.0  MCV 82.6  PLT 99991111   Basic Metabolic Panel: Recent Labs  Lab 12/13/18 2341  NA 122*  K 3.4*  CL 90*  CO2 20*  GLUCOSE 136*  BUN 9  CREATININE 0.57  CALCIUM 8.7*  MG 1.8   GFR: Estimated Creatinine Clearance: 69.4 mL/min (by C-G  formula based on SCr of 0.57 mg/dL). Liver Function Tests: No results for input(s): AST, ALT, ALKPHOS, BILITOT, PROT, ALBUMIN in the last 168 hours. No results for input(s): LIPASE, AMYLASE in the last 168 hours. No results for input(s): AMMONIA in the last 168 hours. Coagulation Profile: No results for input(s): INR, PROTIME in the last 168 hours. Cardiac Enzymes: No results for input(s): CKTOTAL, CKMB, CKMBINDEX, TROPONINI in the last 168 hours. BNP (last 3 results) No results for input(s): PROBNP in the last 8760 hours. HbA1C: No results for input(s): HGBA1C in the last 72 hours. CBG: Recent Labs  Lab 12/13/18 2343  GLUCAP 147*   Lipid Profile: No results for input(s): CHOL, HDL, LDLCALC, TRIG, CHOLHDL, LDLDIRECT in the last 72 hours. Thyroid Function Tests: No results for input(s): TSH, T4TOTAL, FREET4, T3FREE, THYROIDAB in the last 72 hours. Anemia Panel: No results for input(s): VITAMINB12, FOLATE, FERRITIN, TIBC, IRON, RETICCTPCT in the last 72 hours. Urine analysis:    Component Value Date/Time   COLORURINE YELLOW 10/04/2018 2327   APPEARANCEUR CLEAR 10/04/2018 2327   LABSPEC 1.023 10/04/2018 2327   PHURINE 6.0 10/04/2018 2327   GLUCOSEU NEGATIVE 10/04/2018 2327   HGBUR MODERATE (A) 10/04/2018 2327   BILIRUBINUR NEGATIVE 10/04/2018 2327   KETONESUR 20 (A) 10/04/2018 2327   PROTEINUR NEGATIVE 10/04/2018 2327   UROBILINOGEN 1.0 01/25/2014 1623   NITRITE NEGATIVE 10/04/2018 2327   LEUKOCYTESUR TRACE (A) 10/04/2018 2327    Radiological Exams on Admission: No results found.  EKG: Independently reviewed.  Sinus rhythm and first-degree AV block.  First-degree AV block is chronic and seen on previous EKGs as well.  Assessment/Plan Principal Problem:   Pre-syncope Active Problems:   DVT (deep venous thrombosis) (HCC)   Hyponatremia   Hypokalemia   Leukocytosis   Presyncope Suspect related to dehydration/decreased p.o. intake and home antihypertensive/ diuretic  use.  Acute hyponatremia also likely contributing.  No loss of consciousness.  Patient denies chest pain, palpitations, or shortness of breath. EKG not suggestive of ACS or arrhythmia.  First-degree AV block is chronic and patient takes a beta-blocker at home.  Patient was admitted in July for an upper GI bleed.  However, hemoglobin stable at present at 12.5 and  FOBT negative.  PE less likely given no tachycardia or hypoxia. -IV fluid hydration -Check orthostatics -Hold home antihypertensives/ diuretics -Check high-sensitivity troponin level -Chest x-ray  Acute on chronic mild hyponatremia Likely related to decreased p.o. intake and home thiazide diuretic use.  Corrected sodium 123 Has chronic mild hyponatremia with sodium and low 130 range.  No change in mental status or seizures. -IV fluid hydration -Monitor BMP every 4 hours.  Avoid overcorrection. -Check serum osmolarity -Check urine sodium, osmolarity  Mild hypokalemia Related to decreased p.o. intake and home diuretic use.  Potassium 3.4.  Magnesium level normal. -Replete potassium and continue to monitor  Chronic first-degree AV block Takes a beta-blocker at home. -Check TSH, free T4 levels -Hold beta-blocker at this time given concern for hypotension, orthostatics pending  Mild leukocytosis White count 12.9.  Possibly reactive but infectious etiology needs to be ruled out given presyncopal episode. -Chest x-ray, UA -Continue to monitor CBC  History of recurrent DVTs -Continue home Eliquis  Pharmacy med rec pending.  DVT prophylaxis: Eliquis Code Status: Patient wishes to be full code. Family Communication: Daughter at bedside. Disposition Plan: Anticipate discharge after clinical improvement. Consults called: None Admission status: It is my clinical opinion that referral for OBSERVATION is reasonable and necessary in this patient based on the above information provided. The aforementioned taken together are felt to place  the patient at high risk for further clinical deterioration. However it is anticipated that the patient may be medically stable for discharge from the hospital within 24 to 48 hours.  The medical decision making on this patient was of high complexity and the patient is at high risk for clinical deterioration, therefore this is a level 3 visit.  Shela Leff MD [facility] Pager [phone]  If 7PM-7AM, please contact night-coverage [email or web address] Password [email or web address]  12/14/2018, 3:30 AM

## 2018-12-14 NOTE — Progress Notes (Signed)
[facility] PROGRESS NOTE  CAITLYNN MCBREAIRTY [id number] DOB: November 08, 1940 DOA: 12/13/2018 PCP: Merrilee Seashore, MD  Assessment/Plan:  Presyncope. Likely related to  dehydration/decreased p.o. intake and home antihypertensive/ diuretic use.  Acute hyponatremia also likely contributing.  No loss of consciousness.  EKG not suggestive of ACS or arrhythmia.  First-degree AV block is chronic and patient takes a beta-blocker at home. Hemoglobin stable at present at 12.5 and FOBT negative.  PE less likely given no tachycardia or hypoxia. Chest xray with mild bibasilar atelectasis, no cardiopulmonary process.  Provided with IV fluids. Awaiting orthostatic vs.  -IV fluid hydration -Check orthostatics -Hold home antihypertensives/ diuretics -PT eval -mobilize -monitor  Acute on chronic mild hyponatremia. Likely related to decreased p.o. intake and home thiazide diuretic use. Serum osmolality 262, urine sodium 23, urine osmolality 126. Corrected sodium 123 Has chronic mild hyponatremia with sodium and low 130 range.  No change in mental status or seizures. Sodium level trending up slightly.  -IV fluid hydration at lower rate -Monitor BMP.  Avoid overcorrection.  Mild hypokalemia. Potassium 3.2 on admission. Level is 3.5 this am. Related to decreased p.o. intake and home diuretic use. Magnesium level normal. -Replete potassium and continue to monitor  Chronic first-degree AV block. Takes a beta-blocker at home. TSH and Free T4 within limits of normal.  - continue to Hold beta-blocker  orthostatics pending -monitor closely  Mild leukocytosis. White count 12.9.  Likely reactive. Chest xray as noted above. Urinalysis not indicitive of infection. She is afebrile and non-toxic appearing.  -Continue to monitor CBC  History of recurrent DVTs -Continue home Eliquis  Code Status: full Family Communication: daughter at bedside Disposition Plan: home hopefully  tomorrow   Consultants:    Procedures:    Antibiotics:    HPI/Subjective: Awake alert lying in bed in no acute distress. Denies pain/discomfort  Objective: Vitals:   12/14/18 1030 12/14/18 1200  BP: (!) 136/59 (!) 122/59  Pulse: (!) 59 (!) 59  Resp: 18 19  Temp:    SpO2: 100% 99%    Intake/Output Summary (Last 24 hours) at 12/14/2018 1301 Last data filed at 12/14/2018 1009 Gross per 24 hour  Intake 1250 ml  Output -  Net 1250 ml   Filed Weights   12/13/18 2357  Weight: 111 kg    Exam:   General:  Awake alert obese no acute distress  Cardiovascular: RRR no mgr no LE edema  Respiratory: normal effort BS clear bilaterally no wheeze  Abdomen: obese soft +BS no guarding or rebounding  Musculoskeletal: joints without swelling/erythema   Data Reviewed: Basic Metabolic Panel: Recent Labs  Lab 12/13/18 2341 12/14/18 0523 12/14/18 0905  NA 122* 126* 126*  K 3.4* 3.2* 3.5  CL 90* 94* 98  CO2 20* 21* 21*  GLUCOSE 136* 133* 161*  BUN 9 7* 6*  CREATININE 0.57 0.49 0.54  CALCIUM 8.7* 8.4* 8.1*  MG 1.8  --   --    Liver Function Tests: No results for input(s): AST, ALT, ALKPHOS, BILITOT, PROT, ALBUMIN in the last 168 hours. No results for input(s): LIPASE, AMYLASE in the last 168 hours. No results for input(s): AMMONIA in the last 168 hours. CBC: Recent Labs  Lab 12/13/18 2341 12/14/18 0523  WBC 12.9* 12.4*  NEUTROABS 8.4*  --   HGB 12.5 10.8*  HCT 37.0 32.9*  MCV 82.6 82.0  PLT 271 262   Cardiac Enzymes: No results for input(s): CKTOTAL, CKMB, CKMBINDEX, TROPONINI in the last 168 hours. BNP (last 3  results) No results for input(s): BNP in the last 8760 hours.  ProBNP (last 3 results) No results for input(s): PROBNP in the last 8760 hours.  CBG: Recent Labs  Lab 12/13/18 2343  GLUCAP 147*    No results found for this or any previous visit (from the past 240 hour(s)).   Studies: Dg Chest 2 View  Result Date: 12/14/2018 CLINICAL  DATA:  Near syncopal episode EXAM: CHEST - 2 VIEW COMPARISON:  October 07, 2018 FINDINGS: There is mild cardiomegaly. There is minimal bibasilar streaky atelectasis. No large airspace consolidation. No pleural effusion. Bilateral shoulder arthropathy seen. IMPRESSION: Minimal bibasilar atelectasis.  No acute cardiopulmonary process Electronically Signed   By: Prudencio Pair M.D.   On: 12/14/2018 03:47    Scheduled Meds: . apixaban  2.5 mg Oral BID  . lisinopril  20 mg Oral Daily   Continuous Infusions: . sodium chloride 125 mL/hr at 12/14/18 0800    Principal Problem:   Pre-syncope Active Problems:   Hyponatremia   Hypokalemia   Leukocytosis   DVT (deep venous thrombosis) ([place])    Time spent: 45 minutes    [place] NP  [facility]  If 7PM-7AM, please contact night-coverage at [email or web address], password [facility] 12/14/2018, 1:01 PM  LOS: 0 days

## 2018-12-14 NOTE — ED Notes (Signed)
Lunch Tray Ordered @ [id number].

## 2018-12-15 ENCOUNTER — Other Ambulatory Visit: Payer: Self-pay

## 2018-12-15 ENCOUNTER — Encounter (HOSPITAL_COMMUNITY): Payer: Self-pay | Admitting: General Practice

## 2018-12-15 DIAGNOSIS — E871 Hypo-osmolality and hyponatremia: Secondary | ICD-10-CM | POA: Diagnosis not present

## 2018-12-15 DIAGNOSIS — R55 Syncope and collapse: Secondary | ICD-10-CM | POA: Diagnosis not present

## 2018-12-15 LAB — CBC
HCT: 32.6 % — ABNORMAL LOW (ref 36.0–46.0)
Hemoglobin: 10.9 g/dL — ABNORMAL LOW (ref 12.0–15.0)
MCH: 27.7 pg (ref 26.0–34.0)
MCHC: 33.4 g/dL (ref 30.0–36.0)
MCV: 83 fL (ref 80.0–100.0)
Platelets: 261 10*3/uL (ref 150–400)
RBC: 3.93 MIL/uL (ref 3.87–5.11)
RDW: 13.2 % (ref 11.5–15.5)
WBC: 7.2 10*3/uL (ref 4.0–10.5)
nRBC: 0 % (ref 0.0–0.2)

## 2018-12-15 LAB — BASIC METABOLIC PANEL WITH GFR
Anion gap: 7 (ref 5–15)
BUN: 5 mg/dL — ABNORMAL LOW (ref 8–23)
CO2: 22 mmol/L (ref 22–32)
Calcium: 8.5 mg/dL — ABNORMAL LOW (ref 8.9–10.3)
Chloride: 105 mmol/L (ref 98–111)
Creatinine, Ser: 0.87 mg/dL (ref 0.44–1.00)
GFR calc Af Amer: >60 mL/min (ref >60)
GFR calc non Af Amer: >60 mL/min (ref >60)
Glucose, Bld: 96 mg/dL (ref 70–99)
Potassium: 3.4 mmol/L — ABNORMAL LOW (ref 3.5–5.1)
Sodium: 134 mmol/L — ABNORMAL LOW (ref 135–145)

## 2018-12-15 MED ORDER — POTASSIUM CHLORIDE CRYS ER 20 MEQ PO TBCR
40.0000 meq | EXTENDED_RELEASE_TABLET | Freq: Once | ORAL | Status: AC
  Administered 2018-12-15: 09:00:00 40 meq via ORAL
  Filled 2018-12-15: qty 2

## 2018-12-15 NOTE — Progress Notes (Signed)
PT was ambulated in the room after checking orthostatic vitals. Pt did well had a steady gait and did not feel dizzy.

## 2018-12-15 NOTE — Discharge Summary (Signed)
Physician Discharge Summary  Shirley [name] [id number] DOB: Sep 17, 1940 DOA: 12/13/2018  PCP: Merrilee Seashore, MD  Admit date: 12/13/2018 Discharge date: 12/15/2018  Time spent: 45 minutes  Recommendations for Outpatient Follow-up:  1. Follow up with PCP 1-2 weeks for evaluation of symptoms. Recommend bmet to track sodium and potassium   Discharge Diagnoses:  Principal Problem:   Pre-syncope Active Problems:   Hyponatremia   Hypokalemia   Leukocytosis   DVT (deep venous thrombosis) ([place])   Discharge Condition: stable  Diet recommendation: heart healthy  Filed Weights   12/13/18 2357  Weight: 111 kg    History of present illness:  Shirley [name] is a 78 y.o. female with medical history significant of CAD/ STEMI status post BMS to prox RCA in 2015, recurrent DVT followed by hematology and on indefinite anticoagulation, first-degree AV block, GERD, gastric ulcer, GI bleed, hemorrhoids, hypertension, hyperlipidemia, memory problems, morbid obesity, OSA, prediabetes, CVA presented to the ED 9/10 after having a near syncopal event at home.  Patient stated she was stacking items on a shelf at home and all of a sudden felt nauseous, hot, and lightheaded.  She then sat down in a chair.  Daughter at bedside stated after patient sat down in the chair patient's husband noticed that her arms were trembling.  She has no prior history of seizures.  Family did not notice any incontinence/ tongue biting.  Patient denied having any chest pain, palpitations, or shortness of breath at that time.  She did not lose consciousness.  Daughter stated for the previous 2 days patient had been doing a lot of outdoor yard work and had not been keeping herself hydrated.  She takes several blood pressure medications at home including metoprolol, lisinopril, and hydrochlorothiazide.  She previously had a GI bleed in July and since then has been eating small portions only.  No abdominal pain, vomiting, or  diarrhea.  No dysuria.  No cough.  No fevers or chills.  No recent sick contacts.  Hospital Course:   Presyncope. Likely related to  dehydration/decreased p.o. intake and home antihypertensive/ diuretic use. Acute hyponatremia also likely contributing. No loss of consciousness. EKG not suggestive of ACS or arrhythmia. First-degree AV block is chronic and patient takes a beta-blocker at home. Hemoglobin stable at 12.5 and FOBT negative.PE less likely given no tachycardia or hypoxia. Chest xray with mild bibasilar atelectasis, no cardiopulmonary process.  Provided with IV fluids. HCTZ held. Sodium level 136 at discharge. Not orthostatic. Follow up with PCP 1-2 weeks for BMET to track sodium and potassium  Acute on chronic mild hyponatremia. Likely related to decreased p.o. intake and home thiazide diuretic use. Serum osmolality 262, urine sodium 23, urine osmolality 126.Corrected sodium 123 Has chronic mild hyponatremia with sodium and low 130 range. No change in mental status or seizures. Sodium level 136 at discharge. See #1..  Mild hypokalemia. Potassium 3.2 on admission.Magnesium level normal. Related to above. See #2.   Chronic first-degree AV block. Takes a beta-blocker at home. TSH and Free T4 within limits of normal.   Mild leukocytosis. White count 12.9. Likely reactive. Chest xray as noted above. Urinalysis not indicitive of infection. She is afebrile and non-toxic appearing.   History of recurrent DVTs -Continue home Eliquis Procedures:    Consultations:    Discharge Exam: Vitals:   12/15/18 0755 12/15/18 0800  BP:  (!) 128/58  Pulse: 72 62  Resp:  17  Temp:    SpO2:      General: sitting  on side of bed smiling no acute distress obese Cardiovascular: rrr no mgr no LE edema Respiratory: normal effort BS clear bilaterally no wheeze  Discharge Instructions    Allergies as of 12/15/2018   No Known Allergies     Medication List    TAKE these  medications   apixaban 2.5 MG Tabs tablet Commonly known as: Eliquis Take 1 tablet (2.5 mg total) by mouth 2 (two) times daily.   atorvastatin 80 MG tablet Commonly known as: LIPITOR Take 1 tablet (80 mg total) by mouth daily. Need appt for further refills, 1st attempt   ferrous sulfate 325 (65 FE) MG tablet Take 1 tablet (325 mg total) by mouth 2 (two) times daily with a meal.   lisinopril-hydrochlorothiazide 20-25 MG tablet Commonly known as: ZESTORETIC TAKE 1 TABLET BY MOUTH EVERY DAY   metoprolol succinate 25 MG 24 hr tablet Commonly known as: TOPROL-XL Take 1 tablet (25 mg total) by mouth daily. Need appt for further refills, 1st attempt   multivitamin with minerals Tabs tablet Take 1 tablet by mouth every morning.   nitroGLYCERIN 0.4 MG SL tablet Commonly known as: NITROSTAT Place 1 tablet (0.4 mg total) under the tongue every 5 (five) minutes x 3 doses as needed for chest pain.   pantoprazole 40 MG tablet Commonly known as: PROTONIX Take 1 tablet (40 mg total) by mouth daily.   vitamin C 500 MG tablet Commonly known as: ASCORBIC ACID Take 1,000 mg by mouth daily.      No Known Allergies    The results of significant diagnostics from this hospitalization (including imaging, microbiology, ancillary and laboratory) are listed below for reference.    Significant Diagnostic Studies: Dg Chest 2 View  Result Date: 12/14/2018 CLINICAL DATA:  Near syncopal episode EXAM: CHEST - 2 VIEW COMPARISON:  October 07, 2018 FINDINGS: There is mild cardiomegaly. There is minimal bibasilar streaky atelectasis. No large airspace consolidation. No pleural effusion. Bilateral shoulder arthropathy seen. IMPRESSION: Minimal bibasilar atelectasis.  No acute cardiopulmonary process Electronically Signed   By: Prudencio Pair M.D.   On: 12/14/2018 03:47    Microbiology: Recent Results (from the past 240 hour(s))  SARS CORONAVIRUS 2 (TAT 6-24 HRS) Nasopharyngeal Nasopharyngeal Swab     Status:  None   Collection Time: 12/14/18  1:57 AM   Specimen: Nasopharyngeal Swab  Result Value Ref Range Status   SARS Coronavirus 2 NEGATIVE NEGATIVE Final    Comment: (NOTE) SARS-CoV-2 target nucleic acids are NOT DETECTED. The SARS-CoV-2 RNA is generally detectable in upper and lower respiratory specimens during the acute phase of infection. Negative results do not preclude SARS-CoV-2 infection, do not rule out co-infections with other pathogens, and should not be used as the sole basis for treatment or other patient management decisions. Negative results must be combined with clinical observations, patient history, and epidemiological information. The expected result is Negative. Fact Sheet for Patients: SugarRoll.be Fact Sheet for Healthcare Providers: https://www.woods-mathews.com/ This test is not yet approved or cleared by the [place] FDA and  has been authorized for detection and/or diagnosis of SARS-CoV-2 by FDA under an Emergency Use Authorization (EUA). This EUA will remain  in effect (meaning this test can be used) for the duration of the COVID-19 declaration under Section 56 4(b)(1) of the Act, 21 U.S.C. section 360bbb-3(b)(1), unless the authorization is terminated or revoked sooner. Performed at [facility], [place] [address] [place], [place] [address]      Labs: Basic Metabolic Panel: Recent Labs  Lab 12/13/18 2341 12/14/18 0523 12/14/18 0905 12/14/18 1430 12/15/18 0648  NA 122* 126* 126* 131* 134*  K 3.4* 3.2* 3.5 3.6 3.4*  CL 90* 94* 98 98 105  CO2 20* 21* 21* 25 22  GLUCOSE 136* 133* 161* 130* 96  BUN 9 7* 6* 7* 5*  CREATININE 0.57 0.49 0.54 0.56 0.87  CALCIUM 8.7* 8.4* 8.1* 8.4* 8.5*  MG 1.8  --   --   --   --    Liver Function Tests: No results for input(s): AST, ALT, ALKPHOS, BILITOT, PROT, ALBUMIN in the last 168 hours. No results for input(s): LIPASE, AMYLASE in the last 168 hours. No results  for input(s): AMMONIA in the last 168 hours. CBC: Recent Labs  Lab 12/13/18 2341 12/14/18 0523 12/15/18 0648  WBC 12.9* 12.4* 7.2  NEUTROABS 8.4*  --   --   HGB 12.5 10.8* 10.9*  HCT 37.0 32.9* 32.6*  MCV 82.6 82.0 83.0  PLT 271 262 261   Cardiac Enzymes: No results for input(s): CKTOTAL, CKMB, CKMBINDEX, TROPONINI in the last 168 hours. BNP: BNP (last 3 results) No results for input(s): BNP in the last 8760 hours.  ProBNP (last 3 results) No results for input(s): PROBNP in the last 8760 hours.  CBG: Recent Labs  Lab 12/13/18 2343  GLUCAP 147*       Signed:  Radene Gunning NP [facility] 12/15/2018, 9:20 AM

## 2018-12-15 NOTE — Plan of Care (Signed)
  Problem: Education: Goal: Knowledge of General Education information will improve Description: Including pain rating scale, medication(s)/side effects and non-pharmacologic comfort measures Outcome: Progressing   Problem: Health Behavior/Discharge Planning: Goal: Ability to manage health-related needs will improve Outcome: Progressing   Problem: Clinical Measurements: Goal: Ability to maintain clinical measurements within normal limits will improve Outcome: Progressing Goal: Will remain free from infection Outcome: Progressing Goal: Respiratory complications will improve Outcome: Progressing Goal: Cardiovascular complication will be avoided Outcome: Progressing   Problem: Activity: Goal: Risk for activity intolerance will decrease Outcome: Progressing   Problem: Nutrition: Goal: Adequate nutrition will be maintained Outcome: Progressing   Problem: Safety: Goal: Ability to remain free from injury will improve Outcome: Progressing   Problem: Skin Integrity: Goal: Risk for impaired skin integrity will decrease Outcome: Progressing

## 2018-12-15 NOTE — Progress Notes (Signed)
RT met patient at bedside to discuss her CPAP machine tubing.  Respiratory therapy only stocks CPAP machine equipment including tubing to fit the hospital CPAP machines. The patient stated that our tubing was a larger diameter than what her machine has.  The patient stated the machine was at home and she was not sure the make and model.  RT suggested looking for the make and model number on her machine and look online to find replacement tubing. Patient thanked RT for the information and stated that her daughter would be able to help her with it. RN notified.

## 2018-12-15 NOTE — Evaluation (Signed)
Physical Therapy Evaluation and Discharge Patient Details Name: Shirley [name] MRN: [id number] DOB: February 12, 1941 Today's Date: 12/15/2018   History of Present Illness  78 y.o. female with medical history significant of CAD/ STEMI status post BMS to prox RCA in 2015, recurrent DVT followed by hematology and on indefinite anticoagulation, first-degree AV block, GERD, gastric ulcer, GI bleed, hemorrhoids, hypertension, hyperlipidemia, memory problems, morbid obesity, OSA, prediabetes, CVA presenting to the ED after having a near syncopal event at home. Admitted for observation 12/14/18 for pre-sycope, hyponatremia and hypokalemia.   Clinical Impression  PTA pt reports living with husband in single story home with ramped entrance. Pt ambulates without AD in home and cane when she goes out. Pt independent in ADLs, and husband assists with iADLs. Pt is currently mod I for bed mobility, supervision for transfer, and min guard for ambulation of 200 feet while pushing IV pole. Discussed with pt and she states she is at baseline level of ambulation. Pt educated in daily ambulation throughout day. Pt does not require any additional PT services. PT signing off.     Follow Up Recommendations No PT follow up;Supervision - Intermittent    Equipment Recommendations  None recommended by PT    Recommendations for Other Services       Precautions / Restrictions Precautions Precautions: None Restrictions Weight Bearing Restrictions: No      Mobility  Bed Mobility Overal bed mobility: Modified Independent             General bed mobility comments: increased time and effort as well as use of bed rail to come to EoB, c/o of minor dizziness with coming to seated dissipates quickly  Transfers Overall transfer level: Needs assistance Equipment used: None Transfers: Sit to/from Stand Sit to Stand: Supervision         General transfer comment: supervision for power up and steadying beside  bed  Ambulation/Gait Ambulation/Gait assistance: Min guard Gait Distance (Feet): 200 Feet Assistive device: IV Pole Gait Pattern/deviations: Step-through pattern;Decreased step length - right;Decreased step length - left;Shuffle Gait velocity: slowed Gait velocity interpretation: <1.8 ft/sec, indicate of risk for recurrent falls General Gait Details: min guard for safety with slow, shuffling mildly unsteady gait. no overt LoB        Balance Overall balance assessment: Mild deficits observed, not formally tested                                           Pertinent Vitals/Pain Pain Assessment: 0-10 Pain Score: 4  Pain Location: low back and L knee with full extension  Pain Descriptors / Indicators: Aching;Sore Pain Intervention(s): Limited activity within patient's tolerance;Monitored during session;Repositioned    Home Living Family/patient expects to be discharged to:: Private residence Living Arrangements: Spouse/significant other Available Help at Discharge: Family;Available 24 hours/day;Other (Comment) Type of Home: House Home Access: Ramped entrance     Home Layout: One level Home Equipment: [place] - 2 wheels;Cane - single point      Prior Function Level of Independence: Needs assistance   Gait / Transfers Assistance Needed: uses cane for outdoor mobility   ADL's / Homemaking Assistance Needed: husband assists with IADL's           Extremity/Trunk Assessment   Upper Extremity Assessment Upper Extremity Assessment: Overall WFL for tasks assessed    Lower Extremity Assessment Lower Extremity Assessment: Generalized weakness;LLE deficits/detail LLE Deficits / Details:  L knee painful with full extension, pt reports patellar problem LLE Coordination: decreased fine motor       Communication   Communication: No difficulties  Cognition Arousal/Alertness: Awake/alert Behavior During Therapy: WFL for tasks assessed/performed Overall  Cognitive Status: Within Functional Limits for tasks assessed                                        General Comments General comments (skin integrity, edema, etc.): HR 64-78 bpm        Assessment/Plan    PT Assessment Patent does not need any further PT services  PT Problem List         PT Treatment Interventions      PT Goals (Current goals can be found in the Care Plan section)  Acute Rehab PT Goals Patient Stated Goal: go home PT Goal Formulation: With patient     AM-PAC PT "6 Clicks" Mobility  Outcome Measure Help needed turning from your back to your side while in a flat bed without using bedrails?: None Help needed moving from lying on your back to sitting on the side of a flat bed without using bedrails?: None Help needed moving to and from a bed to a chair (including a wheelchair)?: None Help needed standing up from a chair using your arms (e.g., wheelchair or bedside chair)?: None Help needed to walk in hospital room?: None Help needed climbing 3-5 steps with a railing? : A Little 6 Click Score: 23    End of Session Equipment Utilized During Treatment: Gait belt Activity Tolerance: Patient tolerated treatment well Patient left: in chair;with call bell/phone within reach Nurse Communication: Mobility status PT Visit Diagnosis: Unsteadiness on feet (R26.81);Muscle weakness (generalized) (M62.81);Difficulty in walking, not elsewhere classified (R26.2)    Time: [id number] PT Time Calculation (min) (ACUTE ONLY): 20 min   Charges:   PT Evaluation $PT Eval Low Complexity: 1 Low          Laruth Hanger B. Migdalia Dk PT, DPT Acute Rehabilitation Services Pager [phone] Office [phone]   [place] 12/15/2018, 12:03 PM

## 2018-12-15 NOTE — Progress Notes (Signed)
Discharge instructions given. Pt verbalized understanding and all questions were answered.  

## 2018-12-15 NOTE — Progress Notes (Addendum)
This NCM was called to speak with patient about her cpap machine, patient states she needs something with her tubing. This NCM asked the rep with [employer] to speak with patient to see what she needs with for her cpap, he states all she needs is tubing but that she can not get from [employer].  The rep said to check with respiratory , the staff RN Rod Holler checked with respiratory they said they could not give her tubing that she would have to go thru the company she got the cpap machine from. This NCM informed MD of this information.  MD states she will have to go thru her PCP . This NCM also spoke with Collie Siad the resp therapist, she states that they can not give her tubing because it may not fit her cpap and they would be liable so she needs to go to where she got the cpap from to get the tubing.

## 2019-01-12 ENCOUNTER — Other Ambulatory Visit: Payer: Self-pay | Admitting: Cardiovascular Disease

## 2019-01-20 ENCOUNTER — Encounter (HOSPITAL_COMMUNITY): Payer: Self-pay | Admitting: Emergency Medicine

## 2019-01-20 ENCOUNTER — Emergency Department (HOSPITAL_COMMUNITY): Payer: Medicare Other

## 2019-01-20 ENCOUNTER — Emergency Department (HOSPITAL_COMMUNITY)
Admission: EM | Admit: 2019-01-20 | Discharge: 2019-01-20 | Disposition: A | Discharge status: Home / Self Care | Payer: Medicare Other | Attending: Emergency Medicine | Admitting: Emergency Medicine

## 2019-01-20 ENCOUNTER — Other Ambulatory Visit: Payer: Self-pay

## 2019-01-20 DIAGNOSIS — Z8673 Personal history of transient ischemic attack (TIA), and cerebral infarction without residual deficits: Secondary | ICD-10-CM | POA: Insufficient documentation

## 2019-01-20 DIAGNOSIS — R42 Dizziness and giddiness: Secondary | ICD-10-CM | POA: Diagnosis not present

## 2019-01-20 DIAGNOSIS — I252 Old myocardial infarction: Secondary | ICD-10-CM | POA: Insufficient documentation

## 2019-01-20 DIAGNOSIS — E871 Hypo-osmolality and hyponatremia: Secondary | ICD-10-CM | POA: Diagnosis not present

## 2019-01-20 DIAGNOSIS — E876 Hypokalemia: Secondary | ICD-10-CM

## 2019-01-20 DIAGNOSIS — I10 Essential (primary) hypertension: Secondary | ICD-10-CM | POA: Diagnosis not present

## 2019-01-20 DIAGNOSIS — I2510 Atherosclerotic heart disease of native coronary artery without angina pectoris: Secondary | ICD-10-CM | POA: Insufficient documentation

## 2019-01-20 DIAGNOSIS — Z79899 Other long term (current) drug therapy: Secondary | ICD-10-CM | POA: Insufficient documentation

## 2019-01-20 LAB — URINALYSIS, ROUTINE W REFLEX MICROSCOPIC
Bilirubin Urine: NEGATIVE
Glucose, UA: NEGATIVE mg/dL
Hgb urine dipstick: NEGATIVE
Ketones, ur: NEGATIVE mg/dL
Nitrite: NEGATIVE
Protein, ur: NEGATIVE mg/dL
Specific Gravity, Urine: 1.005 (ref 1.005–1.030)
pH: 7 (ref 5.0–8.0)

## 2019-01-20 LAB — CBC
HCT: 35.7 % — ABNORMAL LOW (ref 36.0–46.0)
Hemoglobin: 11.9 g/dL — ABNORMAL LOW (ref 12.0–15.0)
MCH: 27.4 pg (ref 26.0–34.0)
MCHC: 33.3 g/dL (ref 30.0–36.0)
MCV: 82.1 fL (ref 80.0–100.0)
Platelets: 242 10*3/uL (ref 150–400)
RBC: 4.35 MIL/uL (ref 3.87–5.11)
RDW: 14 % (ref 11.5–15.5)
WBC: 8.3 10*3/uL (ref 4.0–10.5)
nRBC: 0 % (ref 0.0–0.2)

## 2019-01-20 LAB — TROPONIN I (HIGH SENSITIVITY)
Troponin I (High Sensitivity): 3 ng/L (ref <18)
Troponin I (High Sensitivity): 6 ng/L (ref <18)

## 2019-01-20 LAB — BASIC METABOLIC PANEL
Anion gap: 10 (ref 5–15)
BUN: 7 mg/dL — ABNORMAL LOW (ref 8–23)
CO2: 23 mmol/L (ref 22–32)
Calcium: 8.6 mg/dL — ABNORMAL LOW (ref 8.9–10.3)
Chloride: 97 mmol/L — ABNORMAL LOW (ref 98–111)
Creatinine, Ser: 0.58 mg/dL (ref 0.44–1.00)
GFR calc Af Amer: >60 mL/min (ref >60)
GFR calc non Af Amer: >60 mL/min (ref >60)
Glucose, Bld: 122 mg/dL — ABNORMAL HIGH (ref 70–99)
Potassium: 3.2 mmol/L — ABNORMAL LOW (ref 3.5–5.1)
Sodium: 130 mmol/L — ABNORMAL LOW (ref 135–145)

## 2019-01-20 LAB — PROTIME-INR
INR: 1.1 (ref 0.8–1.2)
Prothrombin Time: 13.9 seconds (ref 11.4–15.2)

## 2019-01-20 MED ORDER — SODIUM CHLORIDE 0.9 % IV BOLUS
1000.0000 mL | Freq: Once | INTRAVENOUS | Status: AC
  Administered 2019-01-20: 1000 mL via INTRAVENOUS

## 2019-01-20 MED ORDER — ONDANSETRON HCL 4 MG/2ML IJ SOLN
4.0000 mg | Freq: Once | INTRAMUSCULAR | Status: AC
  Administered 2019-01-20: 4 mg via INTRAVENOUS
  Filled 2019-01-20: qty 2

## 2019-01-20 MED ORDER — ONDANSETRON 4 MG PO TBDP: 4.0000 mg | ORAL_TABLET | Freq: Three times a day (TID) | ORAL | 0 refills | Status: DC | PRN

## 2019-01-20 MED ORDER — MECLIZINE HCL 25 MG PO TABS
12.5000 mg | ORAL_TABLET | Freq: Once | ORAL | Status: AC
  Administered 2019-01-20: 12.5 mg via ORAL
  Filled 2019-01-20: qty 1

## 2019-01-20 MED ORDER — LISINOPRIL 20 MG PO TABS: 20.0000 mg | ORAL_TABLET | Freq: Every day | ORAL | 0 refills | Status: DC

## 2019-01-20 MED ORDER — POTASSIUM CHLORIDE CRYS ER 20 MEQ PO TBCR
40.0000 meq | EXTENDED_RELEASE_TABLET | Freq: Once | ORAL | Status: AC
  Administered 2019-01-20: 40 meq via ORAL
  Filled 2019-01-20: qty 2

## 2019-01-20 MED ORDER — MECLIZINE HCL 12.5 MG PO TABS: 12.5000 mg | ORAL_TABLET | Freq: Three times a day (TID) | ORAL | 0 refills | Status: DC | PRN

## 2019-01-20 NOTE — Discharge Instructions (Addendum)
Stop Zestoretic (lisinopril and HCTZ).  Start plain Lisinopril.  Take the antivert only if you feel dizzy.

## 2019-01-20 NOTE — ED Notes (Signed)
ED Provider at bedside. 

## 2019-01-20 NOTE — ED Notes (Signed)
Pt given dc instructions pt verbalizes understanding.  

## 2019-01-20 NOTE — ED Notes (Signed)
Patient transported to CT 

## 2019-01-20 NOTE — ED Provider Notes (Signed)
[facility] EMERGENCY DEPARTMENT Provider Note   CSN: [id number] Arrival date & time: 01/20/19  [id number]     History   Chief Complaint Chief Complaint  Patient presents with  . Dizziness    HPI Shirley [name] is a 78 y.o. female.     Pt presents to the ED today with dizziness.  Pt said she got up to urinate and felt like the room was spinning.  She felt nauseous.  She does not know if it was worse with turning her head.  She has never had anything like this in the past.  She has not had any f/c.  The pt was admitted last month for a near-syncopal episode with hyponatremia.  She responded well to IVFs.  Pt also has a hx of GI bleed and is on Eliquis for a hx of DVTs.  She also has a hx of CAD, but denies any cp or sob.     Past Medical History:  Diagnosis Date  . Anemia   . CAD in native artery, stent to RCA BMS with MI 2015 11/04/2003   a. inferior STEMI 01/2014 s/p BMS to prox RCA.  [name] DVT (deep venous thrombosis) ([place]) 2003   lower extremity DVT with questionable recurrence in 2005. Treated with 1.5 year course of coumadin. Another admission 2015.  . Endometrial thickening on ultra sound 07/2009   path showing Fragments of benign endocervical and squamous mucosa. No dysplasia or malignancy // Followed by Dr. [name]  . First degree AV block   . Gastric ulcer   . GERD (gastroesophageal reflux disease)   . GI bleed   . Hemorrhoid   . Hyperlipidemia LDL goal <70   . Hypertension   . Insomnia 11/02/2010  . Memory difficulties 11/02/2010  . Mild dilation of ascending aorta (HCC)   . Morbid obesity ([place]) 09/16/2012  . Obstructive sleep apnea 09/23/2010  . Prediabetes DX: 09/2010   HgA1c 6.1  . Pulmonary nodule   . ST elevation myocardial infarction (STEMI) of inferior wall ([place]) 01/24/2014  . Stroke ([place]) 2015  . Syncope 12/18/2013  . Syncope, near 12/14/2018  . TIA (transient ischemic attack) 09/23/2010   09/13/2010: CT, MRI/MRA no acute process.  Carotid doppler: no stenosis. 2D-Echo Normal LV function with an EF 55-60%.   [name] UTI (urinary tract infection) 01/27/2014    Patient Active Problem List   Diagnosis Date Noted  . Pre-syncope 12/14/2018  . Hyponatremia 12/14/2018  . Hypokalemia 12/14/2018  . Leukocytosis 12/14/2018  . GI bleed 10/05/2018  . Asymptomatic bacteriuria 10/05/2018  . History of CVA (cerebrovascular accident) 10/05/2018  . Acute upper GI bleed 10/05/2018  . Chest pain 07/03/2014  . Dizziness 02/01/2014  . Chest wall pain 02/01/2014  . UTI (urinary tract infection) 01/27/2014  . ST elevation myocardial infarction (STEMI) of inferior wall ([place]) 01/24/2014  . DVT (deep venous thrombosis), unspecified laterality 12/24/2013  . Morbid obesity ([place]) 09/16/2012  . Need for diphtheria-tetanus-pertussis (Tdap) vaccine, adult/adolescent 11/02/2010  . Insomnia 11/02/2010  . Memory difficulties 11/02/2010  . TIA (transient ischemic attack) 09/23/2010  . Hypertension 09/23/2010  . Hyperlipidemia 09/23/2010  . Hyperglycemia 09/23/2010  . Obstructive sleep apnea 09/23/2010  . Vaginal bleeding problems 09/23/2010  . DVT (deep venous thrombosis) ([place])   . GERD (gastroesophageal reflux disease) 07/07/2010  . Endometrial thickening on ultra sound 07/04/2009  . CAD in native artery, stent to RCA BMS with MI 2015 11/04/2003    Past Surgical History:  Procedure Laterality  Date  . COLONOSCOPY W/ POLYPECTOMY  11/01/2007   8 mm rectal adenoma, hemorrhoids  . CORONARY ANGIOPLASTY WITH STENT PLACEMENT  01/24/14   Inf. STEMI, BMS to RCA  . ESOPHAGOGASTRODUODENOSCOPY N/A 10/06/2018   Procedure: ESOPHAGOGASTRODUODENOSCOPY (EGD);  Surgeon: Doran Stabler, MD;  Location: [facility];  Service: Gastroenterology;  Laterality: N/A;  . HOT HEMOSTASIS N/A 10/06/2018   Procedure: HOT HEMOSTASIS (ARGON PLASMA COAGULATION/BICAP);  Surgeon: Doran Stabler, MD;  Location: [facility];  Service: Gastroenterology;  Laterality: N/A;  .  KNEE SURGERY  1996   Left knee arthroscopy.  [name] LEFT HEART CATHETERIZATION WITH CORONARY ANGIOGRAM N/A 01/24/2014   Procedure: LEFT HEART CATHETERIZATION WITH CORONARY ANGIOGRAM;  Surgeon: Burnell Blanks, MD;  Location[facility] CATH LAB;  Service: Cardiovascular;  Laterality: N/A;  . ROTATOR CUFF REPAIR     right.  [name]  10/06/2018   Procedure: [name];  Surgeon: Doran Stabler, MD;  Location: [facility] ENDOSCOPY;  Service: Gastroenterology;;  . TUBAL LIGATION       OB History   No obstetric history on file.      Home Medications    Prior to Admission medications   Medication Sig Start Date End Date Taking? Authorizing Provider  acetaminophen (TYLENOL) 500 MG tablet Take 500 mg by mouth every 6 (six) hours as needed for mild pain or headache.   Yes [provider]  apixaban (ELIQUIS) 2.5 MG TABS tablet Take 1 tablet (2.5 mg total) by mouth 2 (two) times daily. 11/14/18  Yes Burnell Blanks, MD  atorvastatin (LIPITOR) 80 MG tablet Take 1 tablet (80 mg total) by mouth daily. 01/12/19  Yes Burnell Blanks, MD  ferrous sulfate 325 (65 FE) MG tablet Take 1 tablet (325 mg total) by mouth 2 (two) times daily with a meal. 10/09/18  Yes Georgette Shell, MD  lisinopril-hydrochlorothiazide (ZESTORETIC) 20-25 MG tablet TAKE 1 TABLET BY MOUTH EVERY DAY Patient taking differently: Take 1 tablet by mouth daily.  10/17/18  Yes Burnell Blanks, MD  metoprolol succinate (TOPROL-XL) 25 MG 24 hr tablet Take 1 tablet (25 mg total) by mouth daily. 01/12/19  Yes Burnell Blanks, MD  Multiple Vitamin (MULTIVITAMIN WITH MINERALS) TABS Take 1 tablet by mouth every morning.   Yes [provider]  nitroGLYCERIN (NITROSTAT) 0.4 MG SL tablet Place 1 tablet (0.4 mg total) under the tongue every 5 (five) minutes x 3 doses as needed for chest pain. 09/07/17  Yes Belva Crome, MD  pantoprazole (PROTONIX) 40 MG tablet Take 1 tablet (40 mg total) by mouth  daily. 11/06/18  Yes Danis, Kirke Corin, MD  vitamin C (ASCORBIC ACID) 500 MG tablet Take 1,000 mg by mouth daily.   Yes [provider]  White Petrolatum-Mineral Oil (ARTIFICIAL TEARS) ointment Place 1-2 drops into both eyes as needed (dry eyes).   Yes [provider]    Family History Family History  Problem Relation Age of Onset  . Diabetes Mother   . Hyperlipidemia Mother   . Heart disease Mother   . Dementia Mother   . Angina Father   . Heart attack Father   . Thyroid cancer Sister   . Liver cancer Sister   . Heart disease Brother        x 2 in 71s yo  . Alzheimer's disease Maternal Grandmother   . Stroke Neg Hx   . Colon cancer Neg Hx   . Esophageal cancer Neg Hx   .  Rectal cancer Neg Hx   . Stomach cancer Neg Hx     Social History Social History   Tobacco Use  . Smoking status: Never Smoker  . Smokeless tobacco: Never Used  Substance Use Topics  . Alcohol use: No  . Drug use: No     Allergies   Patient has no known allergies.   Review of Systems Review of Systems  Neurological: Positive for dizziness.  All other systems reviewed and are negative.    Physical Exam Updated Vital Signs BP (!) 171/73   Pulse 62   Temp 98 F (36.7 C) (Oral)   Resp 20   SpO2 100%   Physical Exam Vitals signs and nursing note reviewed.  Constitutional:      Appearance: Normal appearance.  HENT:     Head: Normocephalic and atraumatic.     Right Ear: External ear normal.     Left Ear: External ear normal.     Nose: Nose normal.     Mouth/Throat:     Mouth: Mucous membranes are moist.     Pharynx: Oropharynx is clear.  Eyes:     Extraocular Movements: Extraocular movements intact.     Conjunctiva/sclera: Conjunctivae normal.     Pupils: Pupils are equal, round, and reactive to light.  Neck:     Musculoskeletal: Normal range of motion and neck supple.  Cardiovascular:     Rate and Rhythm: Normal rate and regular rhythm.     Pulses: Normal  pulses.     Heart sounds: Normal heart sounds.  Pulmonary:     Effort: Pulmonary effort is normal.     Breath sounds: Normal breath sounds.  Abdominal:     General: Abdomen is flat. Bowel sounds are normal.     Palpations: Abdomen is soft.  Musculoskeletal: Normal range of motion.  Skin:    General: Skin is warm.     Capillary Refill: Capillary refill takes less than 2 seconds.  Neurological:     General: No focal deficit present.     Mental Status: She is alert and oriented to person, place, and time.  Psychiatric:        Mood and Affect: Mood normal.        Behavior: Behavior normal.        Thought Content: Thought content normal.        Judgment: Judgment normal.      ED Treatments / Results  Labs (all labs ordered are listed, but only abnormal results are displayed) Labs Reviewed  BASIC METABOLIC PANEL - Abnormal; Notable for the following components:      Result Value   Sodium 130 (*)    Potassium 3.2 (*)    Chloride 97 (*)    Glucose, Bld 122 (*)    BUN 7 (*)    Calcium 8.6 (*)    All other components within normal limits  CBC - Abnormal; Notable for the following components:   Hemoglobin 11.9 (*)    HCT 35.7 (*)    All other components within normal limits  URINALYSIS, ROUTINE W REFLEX MICROSCOPIC - Abnormal; Notable for the following components:   Color, Urine STRAW (*)    Leukocytes,Ua MODERATE (*)    Bacteria, UA RARE (*)    Non Squamous Epithelial 0-5 (*)    All other components within normal limits  PROTIME-INR  TROPONIN I (HIGH SENSITIVITY)  TROPONIN I (HIGH SENSITIVITY)    EKG EKG Interpretation  Date/Time:  Saturday January 20 2019 08:15:18  EDT Ventricular Rate:  60 PR Interval:    QRS Duration: 133 QT Interval:  404 QTC Calculation: 404 R Axis:   -42 Text Interpretation:  Age not entered, assumed to be  78 years old for purpose of ECG interpretation Sinus rhythm Prolonged PR interval Nonspecific IVCD with LAD Consider anterior infarct No  significant change since last tracing Confirmed by Isla Pence [phone]) on 01/20/2019 8:19:10 AM   Radiology Dg Chest 2 View  Result Date: 01/20/2019 CLINICAL DATA:  Chest pain, shortness of breath EXAM: CHEST - 2 VIEW COMPARISON:  None. FINDINGS: Lungs are clear.  No pleural effusion or pneumothorax. The heart is normal in size. Degenerative changes of the visualized thoracolumbar spine. IMPRESSION: Normal chest radiographs. Electronically Signed   By: Julian Hy M.D.   On: 01/20/2019 09:34   Ct Head Wo Contrast  Result Date: 01/20/2019 CLINICAL DATA:  Altered mental status EXAM: CT HEAD WITHOUT CONTRAST TECHNIQUE: Contiguous axial images were obtained from the base of the skull through the vertex without intravenous contrast. COMPARISON:  10/08/2018 FINDINGS: Brain: No evidence of acute infarction, hemorrhage, hydrocephalus, extra-axial collection or mass lesion/mass effect. Mild subcortical white matter and periventricular small vessel ischemic changes. Cavum septum pellucidum. Vascular: Mild intracranial atherosclerosis. Skull: Normal. Negative for fracture or focal lesion. Sinuses/Orbits: The visualized paranasal sinuses are essentially clear. The mastoid air cells are unopacified. Other: None. IMPRESSION: No evidence of acute intracranial abnormality. Mild intracranial atherosclerosis. Electronically Signed   By: Julian Hy M.D.   On: 01/20/2019 09:32    Procedures Procedures (including critical care time)  Medications Ordered in ED Medications  ondansetron (ZOFRAN) injection 4 mg (4 mg Intravenous Given 01/20/19 0839)  meclizine (ANTIVERT) tablet 12.5 mg (12.5 mg Oral Given 01/20/19 0839)  sodium chloride 0.9 % bolus 1,000 mL (0 mLs Intravenous Stopped 01/20/19 1031)     Initial Impression / Assessment and Plan / ED Course  I have reviewed the triage vital signs and the nursing notes.  Pertinent labs & imaging results that were available during my care of the  patient were reviewed by me and considered in my medical decision making (see chart for details).       Pt is still hyponatremic, but not as bad as when she was admitted.  She is still taking Zestoretic, so I am going to change that to plain lisinopril and hold the HCTZ.  Pt is able to ambulate and is feeling better.  Final Clinical Impressions(s) / ED Diagnoses   Final diagnoses:  Hyponatremia  Dizziness  Vertigo    ED Discharge Orders    None       Isla Pence, MD 01/20/19 1236

## 2019-01-20 NOTE — ED Notes (Signed)
Family at bedside. 

## 2019-01-20 NOTE — ED Triage Notes (Addendum)
Pt arrives via [facility] from home- pt woke up at 0400 to go to RR and stood up and was dizzy and feeling like the room was spinning. Pt states this is worse with movement and also feels nauseous.

## 2019-01-29 ENCOUNTER — Other Ambulatory Visit: Payer: Medicare Other

## 2019-02-01 ENCOUNTER — Inpatient Hospital Stay: Admission: RE | Admit: 2019-02-01 | Payer: Medicare Other | Source: Ambulatory Visit

## 2019-02-05 DIAGNOSIS — N6311 Unspecified lump in the right breast, upper outer quadrant: Secondary | ICD-10-CM | POA: Diagnosis not present

## 2019-02-05 DIAGNOSIS — N6001 Solitary cyst of right breast: Secondary | ICD-10-CM | POA: Diagnosis not present

## 2019-02-06 DIAGNOSIS — E1165 Type 2 diabetes mellitus with hyperglycemia: Secondary | ICD-10-CM | POA: Diagnosis not present

## 2019-02-06 DIAGNOSIS — E782 Mixed hyperlipidemia: Secondary | ICD-10-CM | POA: Diagnosis not present

## 2019-02-06 DIAGNOSIS — I10 Essential (primary) hypertension: Secondary | ICD-10-CM | POA: Diagnosis not present

## 2019-02-06 DIAGNOSIS — R600 Localized edema: Secondary | ICD-10-CM | POA: Diagnosis not present

## 2019-02-07 DIAGNOSIS — N950 Postmenopausal bleeding: Secondary | ICD-10-CM | POA: Diagnosis not present

## 2019-02-21 DIAGNOSIS — N950 Postmenopausal bleeding: Secondary | ICD-10-CM | POA: Diagnosis not present

## 2019-03-12 ENCOUNTER — Other Ambulatory Visit: Payer: Self-pay

## 2019-03-12 ENCOUNTER — Inpatient Hospital Stay: Payer: Medicare Other | Attending: Nurse Practitioner | Admitting: Gynecologic Oncology

## 2019-03-12 ENCOUNTER — Encounter: Payer: Self-pay | Admitting: Gynecologic Oncology

## 2019-03-12 ENCOUNTER — Telehealth: Payer: Self-pay | Admitting: Cardiovascular Disease

## 2019-03-12 VITALS — BP 140/82 | HR 80 | Temp 98.5°F | Resp 18 | Ht 63.0 in | Wt 246.0 lb

## 2019-03-12 DIAGNOSIS — Z6841 Body Mass Index (BMI) 40.0 and over, adult: Secondary | ICD-10-CM | POA: Insufficient documentation

## 2019-03-12 DIAGNOSIS — E669 Obesity, unspecified: Secondary | ICD-10-CM | POA: Diagnosis not present

## 2019-03-12 DIAGNOSIS — M25562 Pain in left knee: Secondary | ICD-10-CM | POA: Diagnosis not present

## 2019-03-12 DIAGNOSIS — Z8673 Personal history of transient ischemic attack (TIA), and cerebral infarction without residual deficits: Secondary | ICD-10-CM | POA: Insufficient documentation

## 2019-03-12 DIAGNOSIS — G4733 Obstructive sleep apnea (adult) (pediatric): Secondary | ICD-10-CM | POA: Diagnosis not present

## 2019-03-12 DIAGNOSIS — K219 Gastro-esophageal reflux disease without esophagitis: Secondary | ICD-10-CM | POA: Diagnosis not present

## 2019-03-12 DIAGNOSIS — I10 Essential (primary) hypertension: Secondary | ICD-10-CM | POA: Insufficient documentation

## 2019-03-12 DIAGNOSIS — E785 Hyperlipidemia, unspecified: Secondary | ICD-10-CM | POA: Insufficient documentation

## 2019-03-12 DIAGNOSIS — C541 Malignant neoplasm of endometrium: Secondary | ICD-10-CM | POA: Insufficient documentation

## 2019-03-12 DIAGNOSIS — Z7901 Long term (current) use of anticoagulants: Secondary | ICD-10-CM | POA: Insufficient documentation

## 2019-03-12 DIAGNOSIS — Z79899 Other long term (current) drug therapy: Secondary | ICD-10-CM | POA: Diagnosis not present

## 2019-03-12 DIAGNOSIS — I2510 Atherosclerotic heart disease of native coronary artery without angina pectoris: Secondary | ICD-10-CM | POA: Insufficient documentation

## 2019-03-12 DIAGNOSIS — I252 Old myocardial infarction: Secondary | ICD-10-CM | POA: Diagnosis not present

## 2019-03-12 DIAGNOSIS — Z86718 Personal history of other venous thrombosis and embolism: Secondary | ICD-10-CM | POA: Insufficient documentation

## 2019-03-12 DIAGNOSIS — M25561 Pain in right knee: Secondary | ICD-10-CM | POA: Diagnosis not present

## 2019-03-12 MED ORDER — LEVONORGESTREL 20 MCG/24HR IU IUD: 1.0000 | INTRAUTERINE_SYSTEM | Freq: Once | INTRAUTERINE | 0 refills | Status: DC

## 2019-03-12 MED FILL — MIRENA SYSTEM: 20 | 84 days supply | Qty: 1 | Fill #0

## 2019-03-12 NOTE — H&P (View-Only) (Signed)
GYNECOLOGIC ONCOLOGY NEW PATIENT CONSULTATION   Patient Name: Shirley [name]  Patient Age: 78 y.o. Date of Service: 03/12/19 Referring Provider: Merrilee Seashore, [address] [place],  [place] [address]   Primary Care Provider: Merrilee Seashore, MD Consulting Provider: Jeral Pinch, MD   Assessment/Plan:  78 year old with multiple comorbidities and clinical stage I grade 1 endometrial cancer.  We reviewed the nature of endometrial cancer and its recommended surgical staging, including total hysterectomy, bilateral salpingo-oophorectomy, and lymph node assessment.   Given her multiple medical co-morbidities, she is not a suitable surgical candidate at this time, and medical management options are recommended for disease stabilization and reduction of risk in development of endometrial cancer.  The patient would like to proceed with definitive surgery as what I, and I have recommended that we obtain surgical clearance from both her cardiologist and primary care physician.  My hope is that within the next 6 months, as long as she is cleared for surgery, we would be able to proceed.  In the setting of patient's who are not candidates for surgery for endometrial cancer, we reviewed the role of progesterone therapy and the effect on preneoplastic lesions, believed to include induction of apoptosis in addition to tissue sloughing during withdrawal bleeding.  Activation of the progesterone receptors is believed to lead stromal decidualization and thinning of the lining.  We reviewed the 3 most studied options, to include levonorgesterol IUD (69mcg/d), oral medorxyprogesterone acetate 10mg  daily or cyclically [id number] days per month, or oral megesterol acetate 40-200mg  per day.    She understands that all options have few side effects, most common being infrequent edema, GI disturbances, and thromboembolic events), but that local progesterone through IUD may have a stronger  effect on the endometrium with less systemic side effects.  Furthermore, studies demonstrate that up to 70% of women will have regression of hyperplasia or cancer with IUD treatment.  With IUD use, we anticipate that the average duration to regression is 4.5 months, with all cases anticipated to have regression by 9 months.     For monitoring, there is no recommend standard of care.  We will base her surveillance on GOG 224 protocol.  This will include EMB at 3 months following initiation of treatment.  EMBs will be performed at 3 to 6 month intervals, until a minimum of 3 negative biopsy results are obtained, after which sampling frequeny may then be yearly or until new abnormal uterine bleeding develops.  At 6 months, we will evaluate her readiness for surgery.  If she has had persistence of her grade 1 cancer or progression, then we will proceed with surgery pending clearance.  If she has had partial or complete regression, we will discuss again the risk versus benefit of proceeding with surgery.  Given her thickened endometrial lining, I have recommended Mirena IUD placement in the operating room with concurrent dilation and curettage.  We have scheduled this for December 23.  Perioperative instructions were reviewed with the patient as well as the risks and benefits of the procedure.  She understands that she will need Covid testing and clearance prior to surgery.  Given nature of the procedure, she can continue her Eliquis as directed.  We also reviewed the importance of weight loss in overall health as well as reduction in cancer risk.    A copy of this note was sent to the patient's referring provider.   Jeral Pinch, MD  Division of Gynecologic Oncology  Department of Obstetrics and Gynecology  [facility]  ___________________________________________  Chief Complaint: Chief Complaint  Patient presents with  . Endometrial cancer ([place])    History of  Present Illness:  Shirley [name] is a 78 y.o. y.o. female who is seen in consultation at the request of Merrilee Seashore, MD for an evaluation of grade 1 endometrial cancer.  The patient reports a history of postmenopausal bleeding in 2013.  She was seen by Dr. Harrington Challenger at that time and underwent a biopsy showing no cancer.  She reports undergoing some treatment (does not remember if she took medication) and her bleeding improved.  In mid 2020, she had recurrence of vaginal bleeding with a large episode of blood loss.  She describes as intermittent, with periods of bleeding daily and then cessation of the bleeding for a number of days.  She denies any vaginal bleeding over the last month.  She denies any associated symptoms including pelvic pain or cramping.  She notes overall decreased appetite and food intake since treated for her gastric ulcer.  She denies any nausea or emesis but does feel gas in her upper abdomen from time to time associated with eating and intermittently feels that her esophagus is closing.  When this happens, if she is able to belch, she feels better.  The patient was seen on 11/18 for postmenopausal bleeding. Pelvic ultrasound noted endometrial lining measuring 1.75cm with 2 calcified fibroids noted. Given medical comorbidities and recent hospitalizations (and resolution of vaginal bleeding), EMB was performed instead of hysteroscopy. Pathology revealed well differentiated endometrioid adenocarcinoma.  Patient's medical history is complex with a history of DVT, MI requiring stent placement and stroke in 2015 (on anticoagulation since that time), and TIA earlier this year. She was recently admitted in September with near syncope and hyponatremia from dehydration. She was seen again in the ED in October with similar symptoms, did not require ED. In July this year, she was admitted with GI bleeding. She received 2u of pRBCs and on EGD was noted to have ozzing cratered gastric ulcer,  treated with cautery and epinephrine. H. Pylori antibody resulted positive, she was ultimately was treated with antibiotics and remains on pantoprazole.  After this admission, her anticoagulation was transitioned from warfarin to Eliquis.  She has OSA although has not been using her CPAP as is missing some tubing.   She lives at home with her husband although has much support from her 6 children.  She walks with a cane mostly because her children have asked her to.  She is able to ambulate without a cane although also feels that she needs it for security given her bilateral knee pain.  She denies any shortness of breath or chest pain with walking.  She has not walked up or down stairs in some time.  PAST MEDICAL HISTORY:  Past Medical History:  Diagnosis Date  . Anemia   . BMI 40.0-44.9, adult ([place])   . CAD in native artery, stent to RCA BMS with MI 2015 11/04/2003   a. inferior STEMI 01/2014 s/p BMS to prox RCA.  [name] DVT (deep venous thrombosis) ([place]) 2003   lower extremity DVT with questionable recurrence in 2005. Treated with 1.5 year course of coumadin. Another admission 2015.  . Endometrial cancer ([place])   . Endometrial thickening on ultra sound 07/2009   path showing Fragments of benign endocervical and squamous mucosa. No dysplasia or malignancy // Followed by Dr. [name]  . First degree AV block   . Gastric ulcer   .  GERD (gastroesophageal reflux disease)   . GI bleed   . Hemorrhoid   . Hyperlipidemia LDL goal <70   . Hypertension   . Insomnia 11/02/2010  . Memory difficulties 11/02/2010  . Mild dilation of ascending aorta ([facility])   . Morbid obesity ([place]) 09/16/2012  . Obesity   . Obstructive sleep apnea 09/23/2010  . Prediabetes DX: 09/2010   HgA1c 6.1  . Pulmonary nodule   . ST elevation myocardial infarction (STEMI) of inferior wall ([place]) 01/24/2014  . Stroke ([place]) 2015  . Syncope 12/18/2013  . Syncope, near 12/14/2018  . TIA (transient ischemic attack) 09/23/2010    09/13/2010: CT, MRI/MRA no acute process. Carotid doppler: no stenosis. 2D-Echo Normal LV function with an EF 55-60%.   [name] UTI (urinary tract infection) 01/27/2014     PAST SURGICAL HISTORY:  Past Surgical History:  Procedure Laterality Date  . COLONOSCOPY W/ POLYPECTOMY  11/01/2007   8 mm rectal adenoma, hemorrhoids  . CORONARY ANGIOPLASTY WITH STENT PLACEMENT  01/24/14   Inf. STEMI, BMS to RCA  . ESOPHAGOGASTRODUODENOSCOPY N/A 10/06/2018   Procedure: ESOPHAGOGASTRODUODENOSCOPY (EGD);  Surgeon: Doran Stabler, MD;  Location: [facility];  Service: Gastroenterology;  Laterality: N/A;  . HOT HEMOSTASIS N/A 10/06/2018   Procedure: HOT HEMOSTASIS (ARGON PLASMA COAGULATION/BICAP);  Surgeon: Doran Stabler, MD;  Location: [facility];  Service: Gastroenterology;  Laterality: N/A;  . KNEE SURGERY  1996   Left knee arthroscopy.  [name] LEFT HEART CATHETERIZATION WITH CORONARY ANGIOGRAM N/A 01/24/2014   Procedure: LEFT HEART CATHETERIZATION WITH CORONARY ANGIOGRAM;  Surgeon: Burnell Blanks, MD;  Location[facility] CATH LAB;  Service: Cardiovascular;  Laterality: N/A;  . ROTATOR CUFF REPAIR     right.  [name]  10/06/2018   Procedure: [name];  Surgeon: Doran Stabler, MD;  Location[facility] ENDOSCOPY;  Service: Gastroenterology;;  . TUBAL LIGATION      OB/GYN HISTORY:  OB History  Gravida Para Term Preterm AB Living  7 6          SAB TAB Ectopic Multiple Live Births               # Outcome Date GA Lbr Len/2nd Weight Sex Delivery Anes PTL Lv  7 Gravida           6 Para           5 Para           4 Para           3 Para           2 Para           1 Para             No LMP recorded. Patient is postmenopausal.  Age at menarche: 77 Age at menopause: 63 Hx of HRT: no Hx of STDs: no Last pap: 05/2011 History of abnormal pap smears: no  SCREENING STUDIES:  Last mammogram: 01/2018  Last colonoscopy: 10/2007 Last bone mineral density: 06/2011  MEDICATIONS: Outpatient  Encounter Medications as of 03/12/2019  Medication Sig  . acetaminophen (TYLENOL) 500 MG tablet Take 500 mg by mouth every 6 (six) hours as needed for mild pain or headache.  [name] apixaban (ELIQUIS) 2.5 MG TABS tablet Take 1 tablet (2.5 mg total) by mouth 2 (two) times daily.  [name] atorvastatin (LIPITOR) 80 MG tablet Take 1 tablet (80 mg total) by mouth daily.  . ferrous sulfate 325 (65 FE) MG tablet Take 1  tablet (325 mg total) by mouth 2 (two) times daily with a meal.  . lisinopril (ZESTRIL) 40 MG tablet Take 40 mg by mouth daily.  . metoprolol succinate (TOPROL-XL) 25 MG 24 hr tablet Take 1 tablet (25 mg total) by mouth daily.  . Multiple Vitamin (MULTIVITAMIN WITH MINERALS) TABS Take 1 tablet by mouth every morning.  . pantoprazole (PROTONIX) 40 MG tablet Take 1 tablet (40 mg total) by mouth daily.  . vitamin C (ASCORBIC ACID) 500 MG tablet Take 1,000 mg by mouth daily.  Dema Severin Petrolatum-Mineral Oil (ARTIFICIAL TEARS) ointment Place 1-2 drops into both eyes as needed (dry eyes).  . nitroGLYCERIN (NITROSTAT) 0.4 MG SL tablet Place 1 tablet (0.4 mg total) under the tongue every 5 (five) minutes x 3 doses as needed for chest pain. (Patient not taking: Reported on 03/12/2019)  . [DISCONTINUED] lisinopril (ZESTRIL) 20 MG tablet Take 1 tablet (20 mg total) by mouth daily.  . [DISCONTINUED] lisinopril-hydrochlorothiazide (ZESTORETIC) 20-25 MG tablet TAKE 1 TABLET BY MOUTH EVERY DAY (Patient taking differently: Take 1 tablet by mouth daily. )  . [DISCONTINUED] meclizine (ANTIVERT) 12.5 MG tablet Take 1 tablet (12.5 mg total) by mouth 3 (three) times daily as needed for dizziness.  . [DISCONTINUED] ondansetron (ZOFRAN ODT) 4 MG disintegrating tablet Take 1 tablet (4 mg total) by mouth every 8 (eight) hours as needed.   No facility-administered encounter medications on file as of 03/12/2019.     ALLERGIES:  No Known Allergies   FAMILY HISTORY:  Family History  Problem Relation Age of Onset  . Diabetes  Mother   . Hyperlipidemia Mother   . Heart disease Mother   . Dementia Mother   . Angina Father   . Heart attack Father   . Thyroid cancer Sister   . Liver cancer Sister   . Heart disease Brother        x 2 in 67s yo  . Alzheimer's disease Maternal Grandmother   . Stroke Neg Hx   . Colon cancer Neg Hx   . Esophageal cancer Neg Hx   . Rectal cancer Neg Hx   . Stomach cancer Neg Hx   . Ovarian cancer Neg Hx   . Endometrial cancer Neg Hx      SOCIAL HISTORY:  Relationships  Social connections  . Talks on phone: Not on file  . Gets together: Not on file  . Attends religious service: Not on file  . Active member of club or organization: Not on file  . Attends meetings of clubs or organizations: Not on file  . Relationship status: Not on file    REVIEW OF SYSTEMS:  Positive for: Vaginal bleeding, ringing in the ears, joint pain and back pain.She endorses a long history of what sounds like overflow and urge urinary incontinence.   Denies appetite changes, fevers, chills, fatigue, unexplained weight changes. Denies hearing loss, neck lumps or masses, mouth sores, or voice changes. Denies cough or wheezing.  Denies shortness of breath. Denies chest pain or palpitations. Denies leg swelling. Denies abdominal distention, pain, blood in stools, constipation, diarrhea, nausea, vomiting, or early satiety. Denies pain with intercourse, dysuria, frequency, hematuria. Denies hot flashes, pelvic pain, vaginal bleeding or vaginal discharge.   Denies muscle pain/cramps. Denies itching, rash, or wounds. Denies dizziness, headaches, numbness or seizures. Denies swollen lymph nodes or glands, denies easy bruising or bleeding. Denies anxiety, depression, confusion, or decreased concentration.  Physical Exam:  Vital Signs for this encounter:  Blood pressure 140/82, pulse 80,  temperature 98.5 F (36.9 C), temperature source Temporal, resp. rate 18, height 5\' 3"  (1.6 m), weight 246 lb (111.6  kg), SpO2 100 %. Body mass index is 43.58 kg/m. General: Alert, oriented, no acute distress.  HEENT: Normocephalic, atraumatic. Sclera anicteric.  Chest: Clear to auscultation bilaterally.  Cardiovascular: Regular rate and rhythm, no murmurs, rubs, or gallops.  Abdomen: Obese. Normoactive bowel sounds. Soft, nondistended, nontender to palpation. No masses or hepatosplenomegaly appreciated. No palpable fluid wave.  Extremities: Grossly normal range of motion. Warm, well perfused. 2+ edema bilaterally.  Skin: No rashes or lesions.  Lymphatics: No cervical, supraclavicular, or inguinal adenopathy.  GU:  Normal external female genitalia.   No lesions. No discharge or bleeding.             Bladder/urethra:  No lesions or masses, well supported bladder             Vagina: moderately atrophic mucosa, no lesions.             Cervix: Normal appearing, no lesions.             Uterus: mobile, no parametrial involvement or nodularity.  Somewhat difficult to evaluate secondary to body habitus.             Adnexa: no adnexal masses.  Rectal: Confirms the above exam, no nodularity  LABORATORY AND RADIOLOGIC DATA:  Outside medical records were reviewed to synthesize the above history, along with the history and physical obtained during the visit.   EMB on 11/23: FIGO gr 1 endometrioid adenocarcinoma

## 2019-03-12 NOTE — Telephone Encounter (Signed)
I will route to Dr. [name] as Juluis Rainier though surgeon's office will need to fax over clearance form for our Pre Op Team to evaluate.

## 2019-03-12 NOTE — Telephone Encounter (Signed)
New Message  Patients daughter is calling because the patients oncology (Dr. Radford Pax) is going to call to see if the patient is okay to have a hysterectomy on 12/23. The patient was just diagnosis today. She wants Dr. [name] to be aware that he will be contacted.

## 2019-03-12 NOTE — Patient Instructions (Addendum)
It was a pleasure meeting you today.  As we discussed, your biopsy showed a low-grade cancer in your uterus (womb). typical treatment for this would be surgery to remove your uterus, cervix, fallopian tubes, ovaries and lymph node sampling.  Given your multiple medical issues and in the setting of COVID-19, I think it is most prudent to have you see your cardiologist and primary care provider to optimize your medical issues before proceeding with major surgery.  In the meantime, I am recommending that we perform a dilation and curettage with placement of a hormonal intrauterine device to help treat the cancer.  Preparing for your Surgery  Plan for surgery on March 28, 2019 with Dr. Jeral Pinch at [facility] will be scheduled for a dilation and curettage of the uterus with Mirena intrauterine device placement.   We will send the Mirena IUD prescription to our outpatient pharmacy who will run with your insurance.  We will then let you know if you have a copay after insurance coverage.  Pre-operative Testing -You will receive a phone call from presurgical testing at [facility] if you have not received a call already to arrange for a pre-operative testing appointment before your surgery.  This appointment normally occurs one to two weeks before your scheduled surgery.   -Bring your insurance card, copy of an advanced directive if applicable, medication list  -Continue taking your Eliquis before the procedure.  WE WILL ALSO NEED TO BEGIN WORKING ON SURGERY CLEARANCE FROM YOUR PRIMARY CARE PROVIDER AND CARDIOLOGIST WITH PLANS FOR POSSIBLE SURGERY IN SIX MONTHS TIME TO GIVE TIME FOR COVID TO IMPROVE AND YOUR HEALTH IMPROVE AS WELL.  Intrauterine Device Insertion  An intrauterine device (IUD) is a medical device that gets inserted into the uterus to prevent pregnancy. It is a small, T-shaped device that has one or two nylon strings hanging down from it. The strings hang  out of the lower part of the uterus (cervix) to allow for future IUD removal. There are two types of IUDs available:  Copper IUD. This type of IUD has copper wire wrapped around it. Copper makes the uterus and fallopian tubes produce a fluid that kills sperm. A copper IUD may last up to 10 years.  Hormone IUD. This type of IUD is made of plastic and contains the hormone progestin (synthetic progesterone). The hormone thickens mucus in the cervix and prevents sperm from entering the uterus. It also thins the uterine lining to prevent implantation of a fertilized egg. The hormone can weaken or kill the sperm that get into the uterus. A hormone IUD may last 3-5 years. Tell a health care provider about:  Any allergies you have.  All medicines you are taking, including vitamins, herbs, eye drops, creams, and over-the-counter medicines.  Any problems you or family members have had with anesthetic medicines.  Any blood disorders you have.  Any surgeries you have had.  Any medical conditions you have, including any STIs (sexually transmitted infections) you may have.  Whether you are pregnant or may be pregnant. What are the risks? Generally, this is a safe procedure. However, problems may occur, including:  Infection.  Bleeding.  Allergic reactions to medicines.  Accidental puncture (perforation) of the uterus, or damage to other structures or organs.  Accidental placement of the IUD either in the muscle layer of the uterus (myometrium) or outside the uterus.  The IUD falling out of the uterus (expulsion). This is more common among women who have recently  had a child.  Pregnancy that happens in the fallopian tube (ectopic pregnancy).  Infection of the uterus and fallopian tubes (pelvic inflammatory disease). What happens before the procedure?  Schedule the IUD insertion for when you will have your menstrual period or right after, to make sure you are not pregnant. Placement of the  IUD is better tolerated shortly after a menstrual cycle.  Follow instructions from your health care provider about eating or drinking restrictions.  Ask your health care provider about changing or stopping your regular medicines. This is especially important if you are taking diabetes medicines or blood thinners.  You may get a pain reliever to take before the procedure.  You may have tests for:  Pregnancy. A pregnancy test involves having a urine sample taken.  STIs. Placing an IUD in someone who has an STI can make the infection worse.  Cervical cancer. You may have a Pap test to check for this type of cancer. This means collecting cells from your cervix to be examined under a microscope.  You may have a physical exam to determine the size and position of your uterus. The procedure may vary among health care providers and hospitals. What happens during the procedure?  A tool (speculum) will be placed in your vagina and widened so that your health care provider can see your cervix.  Medicine may be applied to your cervix to help lower your risk of infection (antiseptic medicine).  You may be given an anesthetic medicine to numb each side of your cervix (intracervical block or paracervical block). This medicine is usually given by an injection into the cervix.  A tool (uterine sound) will be inserted into your uterus to determine the length of your uterus and the direction that your uterus may be tilted.  A slim instrument or tube (IUD inserter) that holds the IUD will be inserted into your vagina, through your cervical canal, and into your uterus.  The IUD will be placed in the uterus, and the IUD inserter will be removed.  The strings that are attached to the IUD will be trimmed so that they lie just below the cervix. The procedure may vary among health care providers and hospitals. What happens after the procedure?  You may have bleeding after the procedure. This is normal. It  varies from light bleeding (spotting) for a few days to menstrual-like bleeding.  You may have cramping and pain.  You may feel dizzy or light-headed.  You may have lower back pain. Summary  An intrauterine device (IUD) is a small, T-shaped device that has one or two nylon strings hanging down from it.  Two types of IUDs are available. You may have a copper IUD or a hormone IUD.  Schedule the IUD insertion for when you will have your menstrual period or right after, to make sure you are not pregnant. Placement of the IUD is better tolerated shortly after a menstrual cycle.  You may have bleeding after the procedure. This is normal. It varies from light spotting for a few days to menstrual-like bleeding. This information is not intended to replace advice given to you by your health care provider. Make sure you discuss any questions you have with your health care provider. Document Released: 11/18/2010 Document Revised: 02/11/2016 Document Reviewed: 02/11/2016 Elsevier Interactive Patient Education  2017 [employer].  Levonorgestrel intrauterine device (IUD) What is this medicine? LEVONORGESTREL IUD (LEE voe nor jes trel) is a contraceptive (birth control) device. The device is placed inside the  uterus by a healthcare professional. It is used to prevent pregnancy. This device can also be used to treat heavy bleeding that occurs during your period. This medicine may be used for other purposes; ask your health care provider or pharmacist if you have questions. COMMON BRAND NAME(S): Minette Headland What should I tell my health care provider before I take this medicine? They need to know if you have any of these conditions: -abnormal Pap smear -cancer of the breast, uterus, or cervix -diabetes -endometritis -genital or pelvic infection now or in the past -have more than one sexual partner or your partner has more than one partner -heart disease -history of an ectopic or  tubal pregnancy -immune system problems -IUD in place -liver disease or tumor -problems with blood clots or take blood-thinners -seizures -use intravenous drugs -uterus of unusual shape -vaginal bleeding that has not been explained -an unusual or allergic reaction to levonorgestrel, other hormones, silicone, or polyethylene, medicines, foods, dyes, or preservatives -pregnant or trying to get pregnant -breast-feeding How should I use this medicine? This device is placed inside the uterus by a health care professional. Talk to your pediatrician regarding the use of this medicine in children. Special care may be needed. Overdosage: If you think you have taken too much of this medicine contact a poison control center or emergency room at once. NOTE: This medicine is only for you. Do not share this medicine with others. What if I miss a dose? This does not apply. Depending on the brand of device you have inserted, the device will need to be replaced every 3 to 5 years if you wish to continue using this type of birth control. What may interact with this medicine? Do not take this medicine with any of the following medications: -amprenavir -bosentan -fosamprenavir This medicine may also interact with the following medications: -aprepitant -armodafinil -barbiturate medicines for inducing sleep or treating seizures -bexarotene -boceprevir -griseofulvin -medicines to treat seizures like carbamazepine, ethotoin, felbamate, oxcarbazepine, phenytoin, topiramate -modafinil -pioglitazone -rifabutin -rifampin -rifapentine -some medicines to treat HIV infection like atazanavir, efavirenz, indinavir, lopinavir, nelfinavir, tipranavir, ritonavir -St. John's wort -warfarin This list may not describe all possible interactions. Give your health care provider a list of all the medicines, herbs, non-prescription drugs, or dietary supplements you use. Also tell them if you smoke, drink alcohol, or use  illegal drugs. Some items may interact with your medicine. What should I watch for while using this medicine? Visit your doctor or health care professional for regular check ups. See your doctor if you or your partner has sexual contact with others, becomes HIV positive, or gets a sexual transmitted disease. This product does not protect you against HIV infection (AIDS) or other sexually transmitted diseases. You can check the placement of the IUD yourself by reaching up to the top of your vagina with clean fingers to feel the threads. Do not pull on the threads. It is a good habit to check placement after each menstrual period. Call your doctor right away if you feel more of the IUD than just the threads or if you cannot feel the threads at all. The IUD may come out by itself. You may become pregnant if the device comes out. If you notice that the IUD has come out use a backup birth control method like condoms and call your health care provider. Using tampons will not change the position of the IUD and are okay to use during your period. This IUD can be safely  scanned with magnetic resonance imaging (MRI) only under specific conditions. Before you have an MRI, tell your healthcare provider that you have an IUD in place, and which type of IUD you have in place. What side effects may I notice from receiving this medicine? Side effects that you should report to your doctor or health care professional as soon as possible: -allergic reactions like skin rash, itching or hives, swelling of the face, lips, or tongue -fever, flu-like symptoms -genital sores -high blood pressure -no menstrual period for 6 weeks during use -pain, swelling, warmth in the leg -pelvic pain or tenderness -severe or sudden headache -signs of pregnancy -stomach cramping -sudden shortness of breath -trouble with balance, talking, or walking -unusual vaginal bleeding, discharge -yellowing of the eyes or skin Side effects that  usually do not require medical attention (report to your doctor or health care professional if they continue or are bothersome): -acne -breast pain -change in sex drive or performance -changes in weight -cramping, dizziness, or faintness while the device is being inserted -headache -irregular menstrual bleeding within first 3 to 6 months of use -nausea This list may not describe all possible side effects. Call your doctor for medical advice about side effects. You may report side effects to FDA at 1-800-FDA-1088. Where should I keep my medicine? This does not apply. NOTE: This sheet is a summary. It may not cover all possible information. If you have questions about this medicine, talk to your doctor, pharmacist, or health care provider.  2018 Elsevier/Gold Standard (2016-01-02 14:14:56)   Uterine Cancer  Uterine cancer is an abnormal growth of cancer tissue (malignant tumor) in the uterus. Unlike noncancerous (benign) tumors, malignant tumors can spread to other parts of the body. Uterine cancer usually occurs after menopause. However, it may also occur around the time that menopause begins. The wall of the uterus has an inner layer of tissue (endometrium) and an outer layer of muscle tissue (myometrium). The most common type of uterine cancer begins in the endometrium (endometrial cancer). Cancer that begins in the myometrium (uterine sarcoma) is very rare. What are the causes? The exact cause of this condition is not known. What increases the risk? You are more likely to develop this condition if you:  Are older than 50.  Have an enlarged endometrium (endometrial hyperplasia).  Use hormone therapy.  Are severely overweight (obese).  Use the medicine tamoxifen.  You are white (Caucasian).  Cannot bear children (are infertile).  Have never been pregnant.  Started menstruating at an age younger than 12 years.  Are older than 52 and are still having menstrual  periods.  Have a history of cancer of the ovaries, intestines, or colon or rectum (colorectal cancer).  Have a history of enlarged ovaries with small cysts (polycystic ovarian syndrome).  Have a family history of: ? Uterine cancer. ? Hereditary nonpolyposis colon cancer (HNPCC).  Have diabetes, high blood pressure, thyroid disease, or gallbladder disease.  Use long-term, high-dose birth control pills.  Have been exposed to radiation.  Smoke. What are the signs or symptoms? Symptoms of this condition include:  Abnormal vaginal bleeding or discharge. Bleeding may start as a watery, blood-streaked flow that gradually contains more blood. This is the most common symptom. If you experience abnormal vaginal bleeding, do not assume that it is part of menopause.  Vaginal bleeding after menopause.  Unexplained weight loss.  Bleeding between periods.  Urination that is difficult, painful, or more frequent than usual.  A lump (mass) in the vagina.  Pain, bloating, or fullness in the abdomen.  Pain in the pelvic area.  Pain during sex. How is this diagnosed? This condition may be diagnosed based on:  Your medical history and your symptoms.  A physical and pelvic exam. Your health care provider will feel your pelvis for any growths or enlarged lymph nodes.  Blood and urine tests.  Imaging tests, such as X-rays, CT scans, ultrasound, or MRIs.  A procedure in which a thin, flexible tube with a light and camera on the end is inserted through the vagina and used to look inside the uterus (hysteroscopy).  A Pap test to check for abnormal cells in the lower part of the uterus (cervix) and the upper vagina.  Removing a tissue sample (biopsy) from the uterine lining to check for cancer cells.  Dilation and curettage (D&C). This is a procedure that involves stretching (dilation) the cervix and scraping (curettage) the inside lining of the uterus to get a biopsy and check for cancer  cells. Your cancer will be staged to determine its severity and extent. Staging is an assessment of:  The size of the tumor.  Whether the cancer has spread.  Where the cancer has spread. The stages of uterine cancer are as follows:  Stage I. The cancer is only found in the uterus.  Stage II. The cancer has spread to the cervix.  Stage III. The cancer has spread outside the uterus, but not outside the pelvis. The cancer may have spread to the lymph nodes in the pelvis. Lymph nodes are part of your body's disease-fighting (immune) system. Lymph nodes are found in many locations in your body, including the neck, underarm, and groin.  Stage IV. The cancer has spread to other parts of the body, such as the bladder or rectum. How is this treated? This condition is often treated with surgery to remove:  The uterus, cervix, fallopian tubes, and ovaries (total hysterectomy).  The uterus and cervix (simple hysterectomy). The type of hysterectomy you will have depends on the extent of your cancer. Lymph nodes near the uterus may also be removed in some cases. Treatment may also include one or more of the following:  Chemotherapy. This uses medicines to kill the cancer cells and prevent their spread.  Radiation therapy. This uses high-energy rays to kill the cancer cells and prevent the spread of cancer.  Chemoradiation. This is a combination treatment that alternates chemotherapy with radiation treatments to enhance the way radiation works.  Brachytherapy. This involves placing radioactive materials inside the body where the cancer was removed.  Hormone therapy. This includes taking medicines that lower the levels of estrogen in the body. Follow these instructions at home: Activity  Return to your normal activities as told by your health care provider. Ask your health care provider what activities are safe for you.  Exercise regularly as told by your health care provider.  Do not drive  or use heavy machinery while taking prescription pain medicine. General instructions  Take over-the-counter and prescription medicines only as told by your health care provider.  Maintain a healthy diet.  Work with your health care provider to: ? Manage any long-term (chronic) conditions you have, such as diabetes, high blood pressure, thyroid disease, or gallbladder disease. ? Manage any side effects of your treatment.  Do not use any products that contain nicotine or tobacco, such as cigarettes and e-cigarettes. If you need help quitting, ask your health care provider.  Consider joining a support group to help you  cope with stress. Your health care provider may be able to recommend a local or online support group.  Keep all follow-up visits as told by your health care provider. This is important. Where to find more information  American Cancer Society: https://www.cancer.[place]): https://www.cancer.gov Contact a health care provider if:  You have pain in your pelvis or abdomen that gets worse.  You cannot urinate.  You have abnormal bleeding.  You have a fever. Get help right away if:  You develop sudden or new severe symptoms, such as: ? Heavy bleeding. ? Severe weakness. ? Pain that is severe or does not get better with medicine. Summary  Uterine cancer is an abnormal growth of cancer tissue (malignant tumor) in the uterus. The most common type of uterine cancer begins in the endometrium (endometrial cancer).  This condition is often treated with surgery to remove the uterus, cervix, fallopian tubes, and ovaries (total hysterectomy) or the uterus and cervix (simple hysterectomy).  Work with your health care provider to manage any long-term (chronic) conditions you have, such as diabetes, high blood pressure, thyroid disease, or gallbladder disease.  Consider joining a support group to help you cope with stress. Your health care provider may  be able to recommend a local or online support group. This information is not intended to replace advice given to you by your health care provider. Make sure you discuss any questions you have with your health care provider. Document Released: 03/22/2005 Document Revised: 03/04/2017 Document Reviewed: 03/19/2016 Elsevier Patient Education  2020 [employer].

## 2019-03-12 NOTE — Addendum Note (Signed)
Addended by: Joylene John D on: 03/12/2019 04:29 PM   Modules accepted: Orders

## 2019-03-12 NOTE — Progress Notes (Addendum)
GYNECOLOGIC ONCOLOGY NEW PATIENT CONSULTATION   Patient Name: Shirley [name]  Patient Age: 78 y.o. Date of Service: 03/12/19 Referring Provider: Merrilee Seashore, [address],  [place] [address]   Primary Care Provider: Merrilee Seashore, MD Consulting Provider: Jeral Pinch, MD   Assessment/Plan:  78 year old with multiple comorbidities and clinical stage I grade 1 endometrial cancer.  We reviewed the nature of endometrial cancer and its recommended surgical staging, including total hysterectomy, bilateral salpingo-oophorectomy, and lymph node assessment.   Given her multiple medical co-morbidities, she is not a suitable surgical candidate at this time, and medical management options are recommended for disease stabilization and reduction of risk in development of endometrial cancer.  The patient would like to proceed with definitive surgery as what I, and I have recommended that we obtain surgical clearance from both her cardiologist and primary care physician.  My hope is that within the next 6 months, as long as she is cleared for surgery, we would be able to proceed.  In the setting of patient's who are not candidates for surgery for endometrial cancer, we reviewed the role of progesterone therapy and the effect on preneoplastic lesions, believed to include induction of apoptosis in addition to tissue sloughing during withdrawal bleeding.  Activation of the progesterone receptors is believed to lead stromal decidualization and thinning of the lining.  We reviewed the 3 most studied options, to include levonorgesterol IUD (24mcg/d), oral medorxyprogesterone acetate 10mg  daily or cyclically [id number] days per month, or oral megesterol acetate 40-200mg  per day.    She understands that all options have few side effects, most common being infrequent edema, GI disturbances, and thromboembolic events), but that local progesterone through IUD may have a stronger  effect on the endometrium with less systemic side effects.  Furthermore, studies demonstrate that up to 70% of women will have regression of hyperplasia or cancer with IUD treatment.  With IUD use, we anticipate that the average duration to regression is 4.5 months, with all cases anticipated to have regression by 9 months.     For monitoring, there is no recommend standard of care.  We will base her surveillance on GOG 224 protocol.  This will include EMB at 3 months following initiation of treatment.  EMBs will be performed at 3 to 6 month intervals, until a minimum of 3 negative biopsy results are obtained, after which sampling frequeny may then be yearly or until new abnormal uterine bleeding develops.  At 6 months, we will evaluate her readiness for surgery.  If she has had persistence of her grade 1 cancer or progression, then we will proceed with surgery pending clearance.  If she has had partial or complete regression, we will discuss again the risk versus benefit of proceeding with surgery.  Given her thickened endometrial lining, I have recommended Mirena IUD placement in the operating room with concurrent dilation and curettage.  We have scheduled this for December 23.  Perioperative instructions were reviewed with the patient as well as the risks and benefits of the procedure.  She understands that she will need Covid testing and clearance prior to surgery.  Given nature of the procedure, she can continue her Eliquis as directed.  We also reviewed the importance of weight loss in overall health as well as reduction in cancer risk.    A copy of this note was sent to the patient's referring provider.   Jeral Pinch, MD  Division of Gynecologic Oncology  Department of Obstetrics and Gynecology  [facility]  ___________________________________________  Chief Complaint: Chief Complaint  Patient presents with  . Endometrial cancer ([place])    History of  Present Illness:  Shirley [name] is a 78 y.o. y.o. female who is seen in consultation at the request of Merrilee Seashore, MD for an evaluation of grade 1 endometrial cancer.  The patient reports a history of postmenopausal bleeding in 2013.  She was seen by Dr. Harrington Challenger at that time and underwent a biopsy showing no cancer.  She reports undergoing some treatment (does not remember if she took medication) and her bleeding improved.  In mid 2020, she had recurrence of vaginal bleeding with a large episode of blood loss.  She describes as intermittent, with periods of bleeding daily and then cessation of the bleeding for a number of days.  She denies any vaginal bleeding over the last month.  She denies any associated symptoms including pelvic pain or cramping.  She notes overall decreased appetite and food intake since treated for her gastric ulcer.  She denies any nausea or emesis but does feel gas in her upper abdomen from time to time associated with eating and intermittently feels that her esophagus is closing.  When this happens, if she is able to belch, she feels better.  The patient was seen on 11/18 for postmenopausal bleeding. Pelvic ultrasound noted endometrial lining measuring 1.75cm with 2 calcified fibroids noted. Given medical comorbidities and recent hospitalizations (and resolution of vaginal bleeding), EMB was performed instead of hysteroscopy. Pathology revealed well differentiated endometrioid adenocarcinoma.  Patient's medical history is complex with a history of DVT, MI requiring stent placement and stroke in 2015 (on anticoagulation since that time), and TIA earlier this year. She was recently admitted in September with near syncope and hyponatremia from dehydration. She was seen again in the ED in October with similar symptoms, did not require ED. In July this year, she was admitted with GI bleeding. She received 2u of pRBCs and on EGD was noted to have ozzing cratered gastric ulcer,  treated with cautery and epinephrine. H. Pylori antibody resulted positive, she was ultimately was treated with antibiotics and remains on pantoprazole.  After this admission, her anticoagulation was transitioned from warfarin to Eliquis.  She has OSA although has not been using her CPAP as is missing some tubing.   She lives at home with her husband although has much support from her 6 children.  She walks with a cane mostly because her children have asked her to.  She is able to ambulate without a cane although also feels that she needs it for security given her bilateral knee pain.  She denies any shortness of breath or chest pain with walking.  She has not walked up or down stairs in some time.  PAST MEDICAL HISTORY:  Past Medical History:  Diagnosis Date  . Anemia   . BMI 40.0-44.9, adult ([place])   . CAD in native artery, stent to RCA BMS with MI 2015 11/04/2003   a. inferior STEMI 01/2014 s/p BMS to prox RCA.  [name] DVT (deep venous thrombosis) ([place]) 2003   lower extremity DVT with questionable recurrence in 2005. Treated with 1.5 year course of coumadin. Another admission 2015.  . Endometrial cancer ([place])   . Endometrial thickening on ultra sound 07/2009   path showing Fragments of benign endocervical and squamous mucosa. No dysplasia or malignancy // Followed by Dr. [name]  . First degree AV block   . Gastric ulcer   .  GERD (gastroesophageal reflux disease)   . GI bleed   . Hemorrhoid   . Hyperlipidemia LDL goal <70   . Hypertension   . Insomnia 11/02/2010  . Memory difficulties 11/02/2010  . Mild dilation of ascending aorta ([facility])   . Morbid obesity ([place]) 09/16/2012  . Obesity   . Obstructive sleep apnea 09/23/2010  . Prediabetes DX: 09/2010   HgA1c 6.1  . Pulmonary nodule   . ST elevation myocardial infarction (STEMI) of inferior wall ([place]) 01/24/2014  . Stroke ([place]) 2015  . Syncope 12/18/2013  . Syncope, near 12/14/2018  . TIA (transient ischemic attack) 09/23/2010    09/13/2010: CT, MRI/MRA no acute process. Carotid doppler: no stenosis. 2D-Echo Normal LV function with an EF 55-60%.   [name] UTI (urinary tract infection) 01/27/2014     PAST SURGICAL HISTORY:  Past Surgical History:  Procedure Laterality Date  . COLONOSCOPY W/ POLYPECTOMY  11/01/2007   8 mm rectal adenoma, hemorrhoids  . CORONARY ANGIOPLASTY WITH STENT PLACEMENT  01/24/14   Inf. STEMI, BMS to RCA  . ESOPHAGOGASTRODUODENOSCOPY N/A 10/06/2018   Procedure: ESOPHAGOGASTRODUODENOSCOPY (EGD);  Surgeon: Doran Stabler, MD;  Location: [facility];  Service: Gastroenterology;  Laterality: N/A;  . HOT HEMOSTASIS N/A 10/06/2018   Procedure: HOT HEMOSTASIS (ARGON PLASMA COAGULATION/BICAP);  Surgeon: Doran Stabler, MD;  Location: [facility];  Service: Gastroenterology;  Laterality: N/A;  . KNEE SURGERY  1996   Left knee arthroscopy.  [name] LEFT HEART CATHETERIZATION WITH CORONARY ANGIOGRAM N/A 01/24/2014   Procedure: LEFT HEART CATHETERIZATION WITH CORONARY ANGIOGRAM;  Surgeon: Burnell Blanks, MD;  Location[facility] CATH LAB;  Service: Cardiovascular;  Laterality: N/A;  . ROTATOR CUFF REPAIR     right.  [name]  10/06/2018   Procedure: [name];  Surgeon: Doran Stabler, MD;  Location: [facility] ENDOSCOPY;  Service: Gastroenterology;;  . TUBAL LIGATION      OB/GYN HISTORY:  OB History  Gravida Para Term Preterm AB Living  7 6          SAB TAB Ectopic Multiple Live Births               # Outcome Date GA Lbr Len/2nd Weight Sex Delivery Anes PTL Lv  7 Gravida           6 Para           5 Para           4 Para           3 Para           2 Para           1 Para             No LMP recorded. Patient is postmenopausal.  Age at menarche: 22 Age at menopause: 88 Hx of HRT: no Hx of STDs: no Last pap: 05/2011 History of abnormal pap smears: no  SCREENING STUDIES:  Last mammogram: 01/2018  Last colonoscopy: 10/2007 Last bone mineral density: 06/2011  MEDICATIONS: Outpatient  Encounter Medications as of 03/12/2019  Medication Sig  . acetaminophen (TYLENOL) 500 MG tablet Take 500 mg by mouth every 6 (six) hours as needed for mild pain or headache.  [name] apixaban (ELIQUIS) 2.5 MG TABS tablet Take 1 tablet (2.5 mg total) by mouth 2 (two) times daily.  [name] atorvastatin (LIPITOR) 80 MG tablet Take 1 tablet (80 mg total) by mouth daily.  . ferrous sulfate 325 (65 FE) MG tablet Take 1  tablet (325 mg total) by mouth 2 (two) times daily with a meal.  . lisinopril (ZESTRIL) 40 MG tablet Take 40 mg by mouth daily.  . metoprolol succinate (TOPROL-XL) 25 MG 24 hr tablet Take 1 tablet (25 mg total) by mouth daily.  . Multiple Vitamin (MULTIVITAMIN WITH MINERALS) TABS Take 1 tablet by mouth every morning.  . pantoprazole (PROTONIX) 40 MG tablet Take 1 tablet (40 mg total) by mouth daily.  . vitamin C (ASCORBIC ACID) 500 MG tablet Take 1,000 mg by mouth daily.  Dema Severin Petrolatum-Mineral Oil (ARTIFICIAL TEARS) ointment Place 1-2 drops into both eyes as needed (dry eyes).  . nitroGLYCERIN (NITROSTAT) 0.4 MG SL tablet Place 1 tablet (0.4 mg total) under the tongue every 5 (five) minutes x 3 doses as needed for chest pain. (Patient not taking: Reported on 03/12/2019)  . [DISCONTINUED] lisinopril (ZESTRIL) 20 MG tablet Take 1 tablet (20 mg total) by mouth daily.  . [DISCONTINUED] lisinopril-hydrochlorothiazide (ZESTORETIC) 20-25 MG tablet TAKE 1 TABLET BY MOUTH EVERY DAY (Patient taking differently: Take 1 tablet by mouth daily. )  . [DISCONTINUED] meclizine (ANTIVERT) 12.5 MG tablet Take 1 tablet (12.5 mg total) by mouth 3 (three) times daily as needed for dizziness.  . [DISCONTINUED] ondansetron (ZOFRAN ODT) 4 MG disintegrating tablet Take 1 tablet (4 mg total) by mouth every 8 (eight) hours as needed.   No facility-administered encounter medications on file as of 03/12/2019.     ALLERGIES:  No Known Allergies   FAMILY HISTORY:  Family History  Problem Relation Age of Onset  . Diabetes  Mother   . Hyperlipidemia Mother   . Heart disease Mother   . Dementia Mother   . Angina Father   . Heart attack Father   . Thyroid cancer Sister   . Liver cancer Sister   . Heart disease Brother        x 2 in 71s yo  . Alzheimer's disease Maternal Grandmother   . Stroke Neg Hx   . Colon cancer Neg Hx   . Esophageal cancer Neg Hx   . Rectal cancer Neg Hx   . Stomach cancer Neg Hx   . Ovarian cancer Neg Hx   . Endometrial cancer Neg Hx      SOCIAL HISTORY:  Relationships  Social connections  . Talks on phone: Not on file  . Gets together: Not on file  . Attends religious service: Not on file  . Active member of club or organization: Not on file  . Attends meetings of clubs or organizations: Not on file  . Relationship status: Not on file    REVIEW OF SYSTEMS:  Positive for: Vaginal bleeding, ringing in the ears, joint pain and back pain.She endorses a long history of what sounds like overflow and urge urinary incontinence.   Denies appetite changes, fevers, chills, fatigue, unexplained weight changes. Denies hearing loss, neck lumps or masses, mouth sores, or voice changes. Denies cough or wheezing.  Denies shortness of breath. Denies chest pain or palpitations. Denies leg swelling. Denies abdominal distention, pain, blood in stools, constipation, diarrhea, nausea, vomiting, or early satiety. Denies pain with intercourse, dysuria, frequency, hematuria. Denies hot flashes, pelvic pain, vaginal bleeding or vaginal discharge.   Denies muscle pain/cramps. Denies itching, rash, or wounds. Denies dizziness, headaches, numbness or seizures. Denies swollen lymph nodes or glands, denies easy bruising or bleeding. Denies anxiety, depression, confusion, or decreased concentration.  Physical Exam:  Vital Signs for this encounter:  Blood pressure 140/82, pulse 80,  temperature 98.5 F (36.9 C), temperature source Temporal, resp. rate 18, height 5\' 3"  (1.6 m), weight 246 lb (111.6  kg), SpO2 100 %. Body mass index is 43.58 kg/m. General: Alert, oriented, no acute distress.  HEENT: Normocephalic, atraumatic. Sclera anicteric.  Chest: Clear to auscultation bilaterally.  Cardiovascular: Regular rate and rhythm, no murmurs, rubs, or gallops.  Abdomen: Obese. Normoactive bowel sounds. Soft, nondistended, nontender to palpation. No masses or hepatosplenomegaly appreciated. No palpable fluid wave.  Extremities: Grossly normal range of motion. Warm, well perfused. 2+ edema bilaterally.  Skin: No rashes or lesions.  Lymphatics: No cervical, supraclavicular, or inguinal adenopathy.  GU:  Normal external female genitalia.   No lesions. No discharge or bleeding.             Bladder/urethra:  No lesions or masses, well supported bladder             Vagina: moderately atrophic mucosa, no lesions.             Cervix: Normal appearing, no lesions.             Uterus: mobile, no parametrial involvement or nodularity.  Somewhat difficult to evaluate secondary to body habitus.             Adnexa: no adnexal masses.  Rectal: Confirms the above exam, no nodularity  LABORATORY AND RADIOLOGIC DATA:  Outside medical records were reviewed to synthesize the above history, along with the history and physical obtained during the visit.   EMB on 11/23: FIGO gr 1 endometrioid adenocarcinoma

## 2019-03-13 ENCOUNTER — Telehealth: Payer: Self-pay

## 2019-03-13 NOTE — Telephone Encounter (Signed)
Ms Shirley [name] wanted to know if she received chemotherapy now that her family is concerned about her co-morbidites  and the covid virus as well. Reviewed information given at visit with Dr. Berline Lopes. The IUD is not chemotherapy. It is a hormonal manipulation the help control or reduce the uterine cancer. Dr. Berline Lopes is hoping to be able to do surgery in ~ months if her health improves as well as the covid pandemic. Told her that if her children are not comfortable with plan that Dr. Berline Lopes could possibly speak with on chid on her behalf. A couple  of them are physicians. Suggested that she have one of the children on speaker phone while she is at a visit and one listen in on phone visit after D&C IUD placement on 03-28-19. She inquired about having a second opinion. Told her that seeking a second opinion is fine.  She needs to do what makes her feel comfortable with her treatment decisions. Pt states that she is comfortable with Dr. Berline Lopes. Her children are mentioning getting a second opinion. Pt will call the office back if she has any further questions,concerns, or changes in plan of care. Pt appreciated the time.

## 2019-03-14 ENCOUNTER — Telehealth: Payer: Self-pay | Admitting: Gynecologic Oncology

## 2019-03-14 ENCOUNTER — Other Ambulatory Visit: Payer: Self-pay | Admitting: Gynecologic Oncology

## 2019-03-14 ENCOUNTER — Encounter: Payer: Self-pay | Admitting: Gynecologic Oncology

## 2019-03-14 DIAGNOSIS — C541 Malignant neoplasm of endometrium: Secondary | ICD-10-CM

## 2019-03-14 NOTE — Telephone Encounter (Signed)
Caren Griffins, the patient's daughter, returned my call from this morning regarding the patient's concerns voiced yesterday and a phone conversation with Barbaraann Share (see documentation).  Reviewed again plan for D&C and Mirena IUD on the 23rd given my concern for the patient's fitness for surgery.  Caren Griffins has called both the primary care provider and cardiologist to discuss visits for clearance.  I let her know I would make sure that our office had faxed preoperative clearance request to both provider offices.  She will call them back both by the end of the month if she has not received phone calls to get appointment scheduled.  We also discussed plan for imaging to assure no evidence of metastatic disease.  I reviewed the grade 1 diagnosis and that typically this early grade cancer presents at an early stage and is less aggressive.  Ultimately, my hope is that we would be able to proceed with surgery in the next 3 to 6 months, and Caren Griffins voices understanding that between her mother's comorbid conditions and the COVID-19 pandemic, surgery is on hold for the time being.  I also reiterated, as Louise did yesterday, that if the patient and/or her family would feel more comfortable getting a second opinion, that I would encourage them to do this.  All of Cynthia's questions were answered.  Jeral Pinch MD Gynecologic Oncology

## 2019-03-15 ENCOUNTER — Telehealth: Payer: Self-pay | Admitting: *Deleted

## 2019-03-15 NOTE — Telephone Encounter (Signed)
Called and spoke with the patient's daughter, gave the appt for the patient's scan/labs. The patient will pick up contrast with her lab appt

## 2019-03-19 ENCOUNTER — Inpatient Hospital Stay: Payer: Medicare Other

## 2019-03-19 ENCOUNTER — Other Ambulatory Visit: Payer: Self-pay

## 2019-03-19 DIAGNOSIS — C541 Malignant neoplasm of endometrium: Secondary | ICD-10-CM

## 2019-03-19 DIAGNOSIS — G4733 Obstructive sleep apnea (adult) (pediatric): Secondary | ICD-10-CM | POA: Diagnosis not present

## 2019-03-19 DIAGNOSIS — M25561 Pain in right knee: Secondary | ICD-10-CM | POA: Diagnosis not present

## 2019-03-19 DIAGNOSIS — Z86718 Personal history of other venous thrombosis and embolism: Secondary | ICD-10-CM | POA: Diagnosis not present

## 2019-03-19 DIAGNOSIS — I10 Essential (primary) hypertension: Secondary | ICD-10-CM | POA: Diagnosis not present

## 2019-03-19 DIAGNOSIS — K219 Gastro-esophageal reflux disease without esophagitis: Secondary | ICD-10-CM | POA: Diagnosis not present

## 2019-03-19 DIAGNOSIS — E785 Hyperlipidemia, unspecified: Secondary | ICD-10-CM | POA: Diagnosis not present

## 2019-03-19 DIAGNOSIS — M25562 Pain in left knee: Secondary | ICD-10-CM | POA: Diagnosis not present

## 2019-03-19 DIAGNOSIS — I2510 Atherosclerotic heart disease of native coronary artery without angina pectoris: Secondary | ICD-10-CM | POA: Diagnosis not present

## 2019-03-19 DIAGNOSIS — Z7901 Long term (current) use of anticoagulants: Secondary | ICD-10-CM | POA: Diagnosis not present

## 2019-03-19 DIAGNOSIS — Z79899 Other long term (current) drug therapy: Secondary | ICD-10-CM | POA: Diagnosis not present

## 2019-03-19 DIAGNOSIS — Z8673 Personal history of transient ischemic attack (TIA), and cerebral infarction without residual deficits: Secondary | ICD-10-CM | POA: Diagnosis not present

## 2019-03-19 DIAGNOSIS — I252 Old myocardial infarction: Secondary | ICD-10-CM | POA: Diagnosis not present

## 2019-03-19 LAB — BASIC METABOLIC PANEL
Anion gap: 8 (ref 5–15)
BUN: 12 mg/dL (ref 8–23)
CO2: 27 mmol/L (ref 22–32)
Calcium: 8.7 mg/dL — ABNORMAL LOW (ref 8.9–10.3)
Chloride: 105 mmol/L (ref 98–111)
Creatinine, Ser: 0.7 mg/dL (ref 0.44–1.00)
GFR calc Af Amer: >60 mL/min (ref >60)
GFR calc non Af Amer: >60 mL/min (ref >60)
Glucose, Bld: 101 mg/dL — ABNORMAL HIGH (ref 70–99)
Potassium: 4.3 mmol/L (ref 3.5–5.1)
Sodium: 140 mmol/L (ref 135–145)

## 2019-03-20 ENCOUNTER — Other Ambulatory Visit: Payer: Self-pay | Admitting: Gynecologic Oncology

## 2019-03-20 ENCOUNTER — Ambulatory Visit (HOSPITAL_COMMUNITY)
Admission: RE | Admit: 2019-03-20 | Discharge: 2019-03-20 | Disposition: A | Discharge status: Home / Self Care | Payer: Medicare Other | Source: Ambulatory Visit | Attending: Gynecologic Oncology | Admitting: Gynecologic Oncology

## 2019-03-20 DIAGNOSIS — Z91041 Radiographic dye allergy status: Secondary | ICD-10-CM

## 2019-03-20 DIAGNOSIS — C541 Malignant neoplasm of endometrium: Secondary | ICD-10-CM

## 2019-03-20 MED ORDER — DIPHENHYDRAMINE HCL 50 MG PO CAPS: ORAL_CAPSULE | ORAL | 0 refills | Status: DC

## 2019-03-20 MED ORDER — PREDNISONE 50 MG PO TABS: ORAL_TABLET | ORAL | 0 refills | Status: DC

## 2019-03-20 MED ORDER — SODIUM CHLORIDE (PF) 0.9 % IJ SOLN
INTRAMUSCULAR | Status: AC
  Filled 2019-03-20: qty 50

## 2019-03-20 NOTE — Progress Notes (Signed)
Patient presents for CT scan today and reports a history of a rash in the past after contrast.  Per Dr. Berline Lopes, plan to reschedule and premedicate with oral prednisone and benadryl.

## 2019-03-22 NOTE — Patient Instructions (Addendum)
DUE TO COVID-19 ONLY ONE VISITOR IS ALLOWED TO COME WITH YOU AND STAY IN THE WAITING ROOM ONLY DURING PRE OP AND PROCEDURE DAY OF SURGERY. THE 1 VISITOR MAY VISIT WITH YOU AFTER SURGERY IN YOUR PRIVATE ROOM DURING VISITING HOURS ONLY!  YOU NEED TO HAVE A COVID 19 TEST ON 03-24-19 @ 11:35 AM, THIS TEST MUST BE DONE BEFORE SURGERY, COME  [name], [place] , [id number].  ([place]) ONCE YOUR COVID TEST IS COMPLETED, PLEASE BEGIN THE QUARANTINE INSTRUCTIONS AS OUTLINED IN YOUR HANDOUT.                Shirley [name]  03/22/2019   Your procedure is scheduled on: 03-28-19    Report to [facility] Main  Entrance    Report to Admitting at 5:30 AM     Call this number if you have problems the morning of surgery [phone]    Remember: Do not eat food or drink liquids :After Midnight.     Take these medicines the morning of surgery with A SIP OF WATER: Metoprolol Succinate (Toprol-XL), and Pantoprazole (Protonix)  BRUSH YOUR TEETH MORNING OF SURGERY AND RINSE YOUR MOUTH OUT, NO CHEWING GUM CANDY OR MINTS.                                 You may not have any metal on your body including hair pins and              piercings     Do not wear jewelry, make-up, lotions, powders or perfumes, deodorant              Do not wear nail polish on your fingernails.  Do not shave  48 hours prior to surgery.               Do not bring valuables to the hospital. [place].  Contacts, dentures or bridgework may not be worn into surgery.      Patients discharged the day of surgery will not be allowed to drive home. IF YOU ARE HAVING SURGERY AND GOING HOME THE SAME DAY, YOU MUST HAVE AN ADULT TO DRIVE YOU HOME AND BE WITH YOU FOR 24 HOURS. YOU MAY GO HOME BY TAXI OR UBER OR ORTHERWISE, BUT AN ADULT MUST ACCOMPANY YOU HOME AND STAY WITH YOU FOR 24 HOURS.    Name and phone number of your driver: Pt to provide  contact number upon arrival   Special Instructions: N/A              Please read over the following fact sheets you were given: _____________________________________________________________________             [facility] - Preparing for Surgery Before surgery, you can play an important role.  Because skin is not sterile, your skin needs to be as free of germs as possible.  You can reduce the number of germs on your skin by washing with CHG (chlorahexidine gluconate) soap before surgery.  CHG is an antiseptic cleaner which kills germs and bonds with the skin to continue killing germs even after washing. Please DO NOT use if you have an allergy to CHG or antibacterial soaps.  If your skin becomes reddened/irritated stop using the CHG and inform your nurse when you arrive  at Short Stay. Do not shave (including legs and underarms) for at least 48 hours prior to the first CHG shower.  You may shave your face/neck. Please follow these instructions carefully:  1.  Shower with CHG Soap the night before surgery and the  morning of Surgery.  2.  If you choose to wash your hair, wash your hair first as usual with your  normal  shampoo.  3.  After you shampoo, rinse your hair and body thoroughly to remove the  shampoo.                           4.  Use CHG as you would any other liquid soap.  You can apply chg directly  to the skin and wash                       Gently with a scrungie or clean washcloth.  5.  Apply the CHG Soap to your body ONLY FROM THE NECK DOWN.   Do not use on face/ open                           Wound or open sores. Avoid contact with eyes, ears mouth and genitals (private parts).                       Wash face,  Genitals (private parts) with your normal soap.             6.  Wash thoroughly, paying special attention to the area where your surgery  will be performed.  7.  Thoroughly rinse your body with warm water from the neck down.  8.  DO NOT shower/wash with your normal soap  after using and rinsing off  the CHG Soap.                9.  Pat yourself dry with a clean towel.            10.  Wear clean pajamas.            11.  Place clean sheets on your bed the night of your first shower and do not  sleep with pets. Day of Surgery : Do not apply any lotions/deodorants the morning of surgery.  Please wear clean clothes to the hospital/surgery center.  FAILURE TO FOLLOW THESE INSTRUCTIONS MAY RESULT IN THE CANCELLATION OF YOUR SURGERY PATIENT SIGNATURE_________________________________  NURSE SIGNATURE__________________________________  ________________________________________________________________________   Shirley [name]  An incentive spirometer is a tool that can help keep your lungs clear and active. This tool measures how well you are filling your lungs with each breath. Taking long deep breaths may help reverse or decrease the chance of developing breathing (pulmonary) problems (especially infection) following:  A long period of time when you are unable to move or be active. BEFORE THE PROCEDURE   If the spirometer includes an indicator to show your best effort, your nurse or respiratory therapist will set it to a desired goal.  If possible, sit up straight or lean slightly forward. Try not to slouch.  Hold the incentive spirometer in an upright position. INSTRUCTIONS FOR USE  1. Sit on the edge of your bed if possible, or sit up as far as you can in bed or on a chair. 2. Hold the incentive spirometer in an upright position. 3. Breathe out normally. 4. Place  the mouthpiece in your mouth and seal your lips tightly around it. 5. Breathe in slowly and as deeply as possible, raising the piston or the ball toward the top of the column. 6. Hold your breath for 3-5 seconds or for as long as possible. Allow the piston or ball to fall to the bottom of the column. 7. Remove the mouthpiece from your mouth and breathe out normally. 8. Rest for a few seconds  and repeat Steps 1 through 7 at least 10 times every 1-2 hours when you are awake. Take your time and take a few normal breaths between deep breaths. 9. The spirometer may include an indicator to show your best effort. Use the indicator as a goal to work toward during each repetition. 10. After each set of 10 deep breaths, practice coughing to be sure your lungs are clear. If you have an incision (the cut made at the time of surgery), support your incision when coughing by placing a pillow or rolled up towels firmly against it. Once you are able to get out of bed, walk around indoors and cough well. You may stop using the incentive spirometer when instructed by your caregiver.  RISKS AND COMPLICATIONS  Take your time so you do not get dizzy or light-headed.  If you are in pain, you may need to take or ask for pain medication before doing incentive spirometry. It is harder to take a deep breath if you are having pain. AFTER USE  Rest and breathe slowly and easily.  It can be helpful to keep track of a log of your progress. Your caregiver can provide you with a simple table to help with this. If you are using the spirometer at home, follow these instructions: [place] IF:   You are having difficultly using the spirometer.  You have trouble using the spirometer as often as instructed.  Your pain medication is not giving enough relief while using the spirometer.  You develop fever of 100.5 F (38.1 C) or higher. SEEK IMMEDIATE MEDICAL CARE IF:   You cough up bloody sputum that had not been present before.  You develop fever of 102 F (38.9 C) or greater.  You develop worsening pain at or near the incision site. MAKE SURE YOU:   Understand these instructions.  Will watch your condition.  Will get help right away if you are not doing well or get worse. Document Released: 08/02/2006 Document Revised: 06/14/2011 Document Reviewed: 10/03/2006 [facility] Patient Information  2014 [place], [place].   ________________________________________________________________________

## 2019-03-23 ENCOUNTER — Telehealth: Payer: Self-pay

## 2019-03-23 ENCOUNTER — Encounter (HOSPITAL_COMMUNITY)
Admission: RE | Admit: 2019-03-23 | Discharge: 2019-03-23 | Disposition: A | Discharge status: Home / Self Care | Payer: Medicare Other | Source: Ambulatory Visit | Attending: Gynecologic Oncology | Admitting: Gynecologic Oncology

## 2019-03-23 ENCOUNTER — Encounter (HOSPITAL_COMMUNITY): Payer: Self-pay

## 2019-03-23 ENCOUNTER — Other Ambulatory Visit: Payer: Self-pay

## 2019-03-23 DIAGNOSIS — Z79899 Other long term (current) drug therapy: Secondary | ICD-10-CM | POA: Insufficient documentation

## 2019-03-23 DIAGNOSIS — C541 Malignant neoplasm of endometrium: Secondary | ICD-10-CM | POA: Diagnosis not present

## 2019-03-23 DIAGNOSIS — Z7952 Long term (current) use of systemic steroids: Secondary | ICD-10-CM | POA: Diagnosis not present

## 2019-03-23 DIAGNOSIS — K219 Gastro-esophageal reflux disease without esophagitis: Secondary | ICD-10-CM | POA: Diagnosis not present

## 2019-03-23 DIAGNOSIS — R7303 Prediabetes: Secondary | ICD-10-CM | POA: Insufficient documentation

## 2019-03-23 DIAGNOSIS — G4700 Insomnia, unspecified: Secondary | ICD-10-CM | POA: Insufficient documentation

## 2019-03-23 DIAGNOSIS — Z7901 Long term (current) use of anticoagulants: Secondary | ICD-10-CM | POA: Diagnosis not present

## 2019-03-23 DIAGNOSIS — I10 Essential (primary) hypertension: Secondary | ICD-10-CM | POA: Insufficient documentation

## 2019-03-23 DIAGNOSIS — Z01812 Encounter for preprocedural laboratory examination: Secondary | ICD-10-CM | POA: Diagnosis not present

## 2019-03-23 DIAGNOSIS — Z8673 Personal history of transient ischemic attack (TIA), and cerebral infarction without residual deficits: Secondary | ICD-10-CM | POA: Insufficient documentation

## 2019-03-23 DIAGNOSIS — Z955 Presence of coronary angioplasty implant and graft: Secondary | ICD-10-CM | POA: Insufficient documentation

## 2019-03-23 DIAGNOSIS — Z6841 Body Mass Index (BMI) 40.0 and over, adult: Secondary | ICD-10-CM | POA: Diagnosis not present

## 2019-03-23 DIAGNOSIS — I440 Atrioventricular block, first degree: Secondary | ICD-10-CM | POA: Diagnosis not present

## 2019-03-23 DIAGNOSIS — Z86718 Personal history of other venous thrombosis and embolism: Secondary | ICD-10-CM | POA: Insufficient documentation

## 2019-03-23 DIAGNOSIS — I252 Old myocardial infarction: Secondary | ICD-10-CM | POA: Diagnosis not present

## 2019-03-23 DIAGNOSIS — I2510 Atherosclerotic heart disease of native coronary artery without angina pectoris: Secondary | ICD-10-CM | POA: Insufficient documentation

## 2019-03-23 DIAGNOSIS — G4733 Obstructive sleep apnea (adult) (pediatric): Secondary | ICD-10-CM | POA: Diagnosis not present

## 2019-03-23 DIAGNOSIS — D649 Anemia, unspecified: Secondary | ICD-10-CM | POA: Diagnosis not present

## 2019-03-23 DIAGNOSIS — E785 Hyperlipidemia, unspecified: Secondary | ICD-10-CM | POA: Diagnosis not present

## 2019-03-23 NOTE — Telephone Encounter (Signed)
Reviewed schedule for taking prednisone and benadryl prior to CT on 03-27-19 at 1330. She needs to take:   Prednisone 50 mg 13 hrs prior to scan = 03-27-19 at 1230 am Prednisone 50 mg 7 hrs prior to scan = 6:30 am Prednisone 50 mg 1 hr prior to scan = 12:30 pm Benadryl 50 mg 1 hr prior to scan = 12:30 pm.  NPO after 9:30 am 03-27-19 Drink 1 bottle of contrast at 11:30 am and one at 12:30 pm. Pt wrote above instructions down and was able to repeat date and time to this nurse.

## 2019-03-24 ENCOUNTER — Other Ambulatory Visit (HOSPITAL_COMMUNITY)
Admission: RE | Admit: 2019-03-24 | Discharge: 2019-03-24 | Disposition: A | Discharge status: Home / Self Care | Payer: Medicare Other | Source: Ambulatory Visit | Attending: Gynecologic Oncology | Admitting: Gynecologic Oncology

## 2019-03-24 DIAGNOSIS — Z20828 Contact with and (suspected) exposure to other viral communicable diseases: Secondary | ICD-10-CM | POA: Diagnosis not present

## 2019-03-24 DIAGNOSIS — Z01812 Encounter for preprocedural laboratory examination: Secondary | ICD-10-CM | POA: Insufficient documentation

## 2019-03-25 LAB — NOVEL CORONAVIRUS, NAA (HOSP ORDER, SEND-OUT TO REF LAB; TAT 18-24 HRS): SARS-CoV-2, NAA: NOT DETECTED

## 2019-03-26 ENCOUNTER — Telehealth: Payer: Self-pay | Admitting: *Deleted

## 2019-03-26 ENCOUNTER — Encounter (HOSPITAL_COMMUNITY)
Admission: RE | Admit: 2019-03-26 | Discharge: 2019-03-26 | Disposition: A | Discharge status: Home / Self Care | Payer: Medicare Other | Source: Ambulatory Visit | Attending: Gynecologic Oncology | Admitting: Gynecologic Oncology

## 2019-03-26 ENCOUNTER — Other Ambulatory Visit: Payer: Self-pay

## 2019-03-26 DIAGNOSIS — G4733 Obstructive sleep apnea (adult) (pediatric): Secondary | ICD-10-CM | POA: Diagnosis not present

## 2019-03-26 DIAGNOSIS — I10 Essential (primary) hypertension: Secondary | ICD-10-CM | POA: Diagnosis not present

## 2019-03-26 DIAGNOSIS — G4700 Insomnia, unspecified: Secondary | ICD-10-CM | POA: Diagnosis not present

## 2019-03-26 DIAGNOSIS — I252 Old myocardial infarction: Secondary | ICD-10-CM | POA: Diagnosis not present

## 2019-03-26 DIAGNOSIS — I2510 Atherosclerotic heart disease of native coronary artery without angina pectoris: Secondary | ICD-10-CM | POA: Diagnosis not present

## 2019-03-26 DIAGNOSIS — Z79899 Other long term (current) drug therapy: Secondary | ICD-10-CM | POA: Diagnosis not present

## 2019-03-26 DIAGNOSIS — K219 Gastro-esophageal reflux disease without esophagitis: Secondary | ICD-10-CM | POA: Diagnosis not present

## 2019-03-26 DIAGNOSIS — Z8673 Personal history of transient ischemic attack (TIA), and cerebral infarction without residual deficits: Secondary | ICD-10-CM | POA: Diagnosis not present

## 2019-03-26 DIAGNOSIS — Z955 Presence of coronary angioplasty implant and graft: Secondary | ICD-10-CM | POA: Diagnosis not present

## 2019-03-26 DIAGNOSIS — Z7952 Long term (current) use of systemic steroids: Secondary | ICD-10-CM | POA: Diagnosis not present

## 2019-03-26 DIAGNOSIS — E785 Hyperlipidemia, unspecified: Secondary | ICD-10-CM | POA: Diagnosis not present

## 2019-03-26 DIAGNOSIS — Z86718 Personal history of other venous thrombosis and embolism: Secondary | ICD-10-CM | POA: Diagnosis not present

## 2019-03-26 DIAGNOSIS — Z01812 Encounter for preprocedural laboratory examination: Secondary | ICD-10-CM | POA: Diagnosis not present

## 2019-03-26 DIAGNOSIS — I440 Atrioventricular block, first degree: Secondary | ICD-10-CM | POA: Diagnosis not present

## 2019-03-26 DIAGNOSIS — R7303 Prediabetes: Secondary | ICD-10-CM | POA: Diagnosis not present

## 2019-03-26 DIAGNOSIS — Z7901 Long term (current) use of anticoagulants: Secondary | ICD-10-CM | POA: Diagnosis not present

## 2019-03-26 DIAGNOSIS — D649 Anemia, unspecified: Secondary | ICD-10-CM | POA: Diagnosis not present

## 2019-03-26 LAB — HEMOGLOBIN A1C
Hgb A1c MFr Bld: 5.5 % (ref 4.8–5.6)
Mean Plasma Glucose: 111.15 mg/dL

## 2019-03-26 LAB — CBC
HCT: 40.4 % (ref 36.0–46.0)
Hemoglobin: 12.4 g/dL (ref 12.0–15.0)
MCH: 27.3 pg (ref 26.0–34.0)
MCHC: 30.7 g/dL (ref 30.0–36.0)
MCV: 88.8 fL (ref 80.0–100.0)
Platelets: 218 10*3/uL (ref 150–400)
RBC: 4.55 MIL/uL (ref 3.87–5.11)
RDW: 15.9 % — ABNORMAL HIGH (ref 11.5–15.5)
WBC: 8.1 10*3/uL (ref 4.0–10.5)
nRBC: 0 % (ref 0.0–0.2)

## 2019-03-26 LAB — URINALYSIS, ROUTINE W REFLEX MICROSCOPIC
Bilirubin Urine: NEGATIVE
Glucose, UA: NEGATIVE mg/dL
Ketones, ur: NEGATIVE mg/dL
Nitrite: NEGATIVE
Protein, ur: NEGATIVE mg/dL
Specific Gravity, Urine: 1.012 (ref 1.005–1.030)
WBC, UA: >50 WBC/hpf — ABNORMAL HIGH (ref 0–5)
pH: 7 (ref 5.0–8.0)

## 2019-03-26 LAB — COMPREHENSIVE METABOLIC PANEL
ALT: 19 U/L (ref 0–44)
AST: 20 U/L (ref 15–41)
Albumin: 3.2 g/dL — ABNORMAL LOW (ref 3.5–5.0)
Alkaline Phosphatase: 110 U/L (ref 38–126)
Anion gap: 7 (ref 5–15)
BUN: 15 mg/dL (ref 8–23)
CO2: 26 mmol/L (ref 22–32)
Calcium: 8.8 mg/dL — ABNORMAL LOW (ref 8.9–10.3)
Chloride: 106 mmol/L (ref 98–111)
Creatinine, Ser: 0.6 mg/dL (ref 0.44–1.00)
GFR calc Af Amer: >60 mL/min (ref >60)
GFR calc non Af Amer: >60 mL/min (ref >60)
Glucose, Bld: 100 mg/dL — ABNORMAL HIGH (ref 70–99)
Potassium: 3.9 mmol/L (ref 3.5–5.1)
Sodium: 139 mmol/L (ref 135–145)
Total Bilirubin: 0.8 mg/dL (ref 0.3–1.2)
Total Protein: 6.3 g/dL — ABNORMAL LOW (ref 6.5–8.1)

## 2019-03-26 LAB — ABO/RH: ABO/RH(D): A POS

## 2019-03-26 NOTE — Telephone Encounter (Signed)
Fax medical and cardiac clearance forms to the patient's doctors

## 2019-03-26 NOTE — Progress Notes (Addendum)
Anesthesia Chart Review   Case: [id number] Date/Time: 03/28/19 0715   Procedures:      DILATATION AND CURETTAGE OF UTERUS (N/A )     INTRAUTERINE DEVICE (IUD) INSERTION (N/A )   Anesthesia type: Choice   Pre-op diagnosis: ENDOMETRIAL CANCER   Location: [facility] / [facility]   Surgeons: Lafonda Mosses, MD      DISCUSSION:78 y.o. never smoker with h/o HTN, GERD, recurrent DVT (on Eliquis), pre-diabetes, HLD, TIA, CAD (BMS to RCA 2015), sleep apnea, endometrial cancer scheduled for above procedure 03/28/2019 with Dr. Jeral Pinch.   Per Dr. Berline Lopes, "Given her multiple medical co-morbidities, she is not a suitable surgical candidate at this time, and medical management options are recommended for disease stabilization and reduction of risk in development of endometrial cancer.  The patient would like to proceed with definitive surgery as what I, and I have recommended that we obtain surgical clearance from both her cardiologist and primary care physician.  My hope is that within the next 6 months, as long as she is cleared for surgery, we would be able to proceed." Surgical clearances from cardiologist and PCP requested before proceeding with total hysterectomy, bilateral salpingo-oophorectomy, and lymph node assessment.   Pt last seen by cardiology 12/04/2018.  Seen by Melina Copa, PA-C.  Per OV note CAD symptomatically stable.  1 year follow up recommended.   Pt with recent GI bleed.  Seen by hematology 12/06/2018, followed for recurrent DVT on indefinite anticoagulantion.  Per OV note, "Recommend continue prophylactic dose of Eliquis 2.5 mg BID, which would lower her risk of severe bleeding. Due to her ulcer, she will remain off aspirin. Will reevaluate in 6 months with lab and f/u at that time. Recent renal function is normal. She will continue close f/u with PCP and cardiology in the meantime."  Pt admitted 9/9-9/02/2019 due to presyncopal episode likely related to dehydration/decreased pa  intake and home antihypertensives/diuretic use.    Discussed with Dr. Valma Cava.  Anticipate pt can proceed with planned procedure barring acute status change.   VS: BP (!) 161/82 (BP Location: Left Arm)   Pulse 63   Temp 36.8 C (Oral)   Resp 18   Ht 5\' 3"  (1.6 m)   Wt 109.2 kg   SpO2 98%   BMI 42.66 kg/m   PROVIDERS: Merrilee Seashore, MD is PCP   Lauree Chandler, MD is Cardiologist  LABS: Labs reviewed: Acceptable for surgery. and Urinalysis routed to surgeon (all labs ordered are listed, but only abnormal results are displayed)  Labs Reviewed  CBC - Abnormal; Notable for the following components:      Result Value   RDW 15.9 (*)    All other components within normal limits  COMPREHENSIVE METABOLIC PANEL - Abnormal; Notable for the following components:   Glucose, Bld 100 (*)    Calcium 8.8 (*)    Total Protein 6.3 (*)    Albumin 3.2 (*)    All other components within normal limits  URINALYSIS, ROUTINE W REFLEX MICROSCOPIC - Abnormal; Notable for the following components:   APPearance HAZY (*)    Hgb urine dipstick MODERATE (*)    Leukocytes,Ua LARGE (*)    WBC, UA >50 (*)    Bacteria, UA FEW (*)    All other components within normal limits  HEMOGLOBIN A1C  TYPE AND SCREEN     IMAGES: Chest Xray 01/20/2019 FINDINGS: Lungs are clear.  No pleural effusion or pneumothorax.  The heart is normal in  size.  Degenerative changes of the visualized thoracolumbar spine.  IMPRESSION: Normal chest radiographs.  EKG: 01/22/2019 Rate 60 bpm  Sinus rhythm  Prolonged PR interval  Nonspecific IVCD with LAD   CV: Myocardial Perfusion Imaging 09/28/2018   Nuclear stress EF: 76%. No wall motion abnormalities  There was no ST segment deviation noted during stress.  Defect 1: There is a small defect of mild severity present in the apical lateral and apex location. No ischemia identified  Overall low risk study with no ischemia identified. Small apical  lateral fixed defect consistent with old infarct. Old inferior ST elevation myocardial infarction by history. Past Medical History:  Diagnosis Date  . Anemia   . BMI 40.0-44.9, adult ([place])   . CAD in native artery, stent to RCA BMS with MI 2015 11/04/2003   a. inferior STEMI 01/2014 s/p BMS to prox RCA.  [name] DVT (deep venous thrombosis) ([place]) 2003   lower extremity DVT with questionable recurrence in 2005. Treated with 1.5 year course of coumadin. Another admission 2015.  . Endometrial cancer ([place])   . Endometrial thickening on ultra sound 07/2009   path showing Fragments of benign endocervical and squamous mucosa. No dysplasia or malignancy // Followed by Dr. [name]  . First degree AV block   . Gastric ulcer   . GERD (gastroesophageal reflux disease)   . GI bleed   . Hemorrhoid   . Hyperlipidemia LDL goal <70   . Hypertension   . Insomnia 11/02/2010  . Memory difficulties 11/02/2010  . Mild dilation of ascending aorta ([facility])   . Morbid obesity ([place]) 09/16/2012  . Obesity   . Obstructive sleep apnea 09/23/2010  . Prediabetes DX: 09/2010   HgA1c 6.1  . Pulmonary nodule   . ST elevation myocardial infarction (STEMI) of inferior wall ([place]) 01/24/2014  . Stroke ([place]) 2015  . Syncope 12/18/2013  . Syncope, near 12/14/2018  . TIA (transient ischemic attack) 09/23/2010   09/13/2010: CT, MRI/MRA no acute process. Carotid doppler: no stenosis. 2D-Echo Normal LV function with an EF 55-60%.   [name] UTI (urinary tract infection) 01/27/2014    Past Surgical History:  Procedure Laterality Date  . COLONOSCOPY W/ POLYPECTOMY  11/01/2007   8 mm rectal adenoma, hemorrhoids  . CORONARY ANGIOPLASTY WITH STENT PLACEMENT  01/24/14   Inf. STEMI, BMS to RCA  . ESOPHAGOGASTRODUODENOSCOPY N/A 10/06/2018   Procedure: ESOPHAGOGASTRODUODENOSCOPY (EGD);  Surgeon: Doran Stabler, MD;  Location: [facility];  Service: Gastroenterology;  Laterality: N/A;  . HOT HEMOSTASIS N/A 10/06/2018   Procedure: HOT  HEMOSTASIS (ARGON PLASMA COAGULATION/BICAP);  Surgeon: Doran Stabler, MD;  Location: [facility];  Service: Gastroenterology;  Laterality: N/A;  . KNEE SURGERY  1996   Left knee arthroscopy.  [name] LEFT HEART CATHETERIZATION WITH CORONARY ANGIOGRAM N/A 01/24/2014   Procedure: LEFT HEART CATHETERIZATION WITH CORONARY ANGIOGRAM;  Surgeon: Burnell Blanks, MD;  Location: [facility] CATH LAB;  Service: Cardiovascular;  Laterality: N/A;  . ROTATOR CUFF REPAIR     right.  [name]  10/06/2018   Procedure: [name];  Surgeon: Doran Stabler, MD;  Location: [place] [facility];  Service: Gastroenterology;;  . TUBAL LIGATION      MEDICATIONS: . acetaminophen (TYLENOL) 500 MG tablet  . apixaban (ELIQUIS) 2.5 MG TABS tablet  . atorvastatin (LIPITOR) 80 MG tablet  . Cholecalciferol (VITAMIN D3) 10 MCG (400 UNIT) CAPS  . diphenhydrAMINE (BENADRYL) 50 MG capsule  . ferrous sulfate 325 (65 FE) MG tablet  . [START  ON 03/28/2019] levonorgestrel (MIRENA) 20 MCG/24HR IUD  . lisinopril (ZESTRIL) 40 MG tablet  . metoprolol succinate (TOPROL-XL) 25 MG 24 hr tablet  . Multiple Vitamin (MULTIVITAMIN WITH MINERALS) TABS  . nitroGLYCERIN (NITROSTAT) 0.4 MG SL tablet  . pantoprazole (PROTONIX) 40 MG tablet  . predniSONE (DELTASONE) 50 MG tablet   No current facility-administered medications for this encounter.     Maia Plan [facility] Pre-Surgical Testing [phone] 03/26/19  10:53 AM

## 2019-03-26 NOTE — Telephone Encounter (Addendum)
Pt takes Eliquis for recurrent DVT (2003, questionable recurrence in 2005, another in 2015-on lifelong anticoag), does also have hx of TIA in 2012 as well. Renal function is normal. Ok to hold Eliquis for 24 hours prior to procedure - if > 24 hour hold is required, will need physician input.

## 2019-03-26 NOTE — Telephone Encounter (Signed)
    [facility] Pre-operative Risk Assessment    Request for surgical clearance:  1. What type of surgery is being performed? ROBOTIC HYSTERECTOMY BSO, SLN   2. When is this surgery scheduled? June 2021   3. What type of clearance is required (medical clearance vs. Pharmacy clearance to hold med vs. Both)? BOTH  4. Are there any medications that need to be held prior to surgery and how long? ELIQUIS   5. Practice name and name of physician performing surgery? [name]; DR. Berline Lopes   6. What is your office phone number [phone]    7.   What is your office fax number [phone]  8.   Anesthesia type (None, local, MAC, general) ? GENERAL   Shirley [name] 03/26/2019, 12:31 PM  _________________________________________________________________   (provider comments below)

## 2019-03-27 ENCOUNTER — Telehealth: Payer: Self-pay

## 2019-03-27 ENCOUNTER — Ambulatory Visit (HOSPITAL_COMMUNITY)
Admission: RE | Admit: 2019-03-27 | Discharge: 2019-03-27 | Disposition: A | Discharge status: Home / Self Care | Payer: Medicare Other | Source: Ambulatory Visit | Attending: Gynecologic Oncology | Admitting: Gynecologic Oncology

## 2019-03-27 DIAGNOSIS — I700 Atherosclerosis of aorta: Secondary | ICD-10-CM | POA: Insufficient documentation

## 2019-03-27 DIAGNOSIS — C541 Malignant neoplasm of endometrium: Secondary | ICD-10-CM | POA: Diagnosis present

## 2019-03-27 DIAGNOSIS — D259 Leiomyoma of uterus, unspecified: Secondary | ICD-10-CM | POA: Insufficient documentation

## 2019-03-27 MED ORDER — IOHEXOL 300 MG/ML  SOLN
100.0000 mL | Freq: Once | INTRAMUSCULAR | Status: AC | PRN
  Administered 2019-03-27: 100 mL via INTRAVENOUS

## 2019-03-27 NOTE — Telephone Encounter (Signed)
Ms Shirley [name] stated that she was not sure when to take her Toprol-XL and Protonix tomorrow morning since she has to be at the hospital at 0530. Told her to take the tablets just prior to leaving the house to go to the hospital. Pt verbalized understanding. She did not have any other questions or concerns regarding surgery.

## 2019-03-27 NOTE — Telephone Encounter (Signed)
I s/w Dr. Charisse March office yesterday and confirmed the the surgery is indeed to be done June 2021. Pt has a recall that should be sent out in 04/2019 to call for appt with Dr. [name]. I will send a message to Dr. Camillia Herter nurse. I reviewed the schedule though no open slots.

## 2019-03-27 NOTE — Telephone Encounter (Signed)
I tried to reach the pt to scheduler her an appt with Dr. [name] 06/08/19 @ 10:40 am for her surgery clearance appt, ok per Brooke Dare. RN to use time slot. There was no answer and no vm came on. I will attempt to reach the pt later. I did hold the time slot for the pt at this time.

## 2019-03-27 NOTE — Telephone Encounter (Signed)
Request indicates that surgery is planned for June 2021. The patient will be due for her regular follow up prior to that in Feb. Please arrange for her follow up and add preop clearance to the appt note.  (may need to verify this surgery date)

## 2019-03-27 NOTE — Telephone Encounter (Signed)
I was able to reach the pt and schedule her to see Dr. [name] 06/08/19 @ 10:40 am ok per Michalene to use time slot for surgery clearance appt. Pt was thankful for the appt. This is for he surgery in June 2021. I will remove from the pre op call back pool.

## 2019-03-27 NOTE — Telephone Encounter (Signed)
Spoke with radiology and the CT scan will be at 7:30 pm tonight. Arrive at [facility]. Told Ms Shirley [name] that she needs to only take the prednisone 50 mg at 1230 pm today and then take the prednisone and benadryl does at 6:30 pm today. She already drank her 1130 bottle of contrast. Told pt that CT said that she only needs to drink the second bottle of contrast at 6:30 pm today. Pt wrote instructions down and verbalized understanding.

## 2019-03-28 ENCOUNTER — Ambulatory Visit (HOSPITAL_COMMUNITY)
Admission: RE | Admit: 2019-03-28 | Discharge: 2019-03-28 | Disposition: A | Discharge status: Home / Self Care | Payer: Medicare Other | Attending: Gynecologic Oncology | Admitting: Gynecologic Oncology

## 2019-03-28 ENCOUNTER — Encounter (HOSPITAL_COMMUNITY)
Admission: RE | Disposition: A | Discharge status: Home / Self Care | Payer: Self-pay | Source: Home / Self Care | Attending: Gynecologic Oncology

## 2019-03-28 ENCOUNTER — Ambulatory Visit (HOSPITAL_COMMUNITY): Payer: Medicare Other | Admitting: Physician Assistant

## 2019-03-28 ENCOUNTER — Ambulatory Visit (HOSPITAL_COMMUNITY): Payer: Medicare Other | Admitting: Certified Registered"

## 2019-03-28 ENCOUNTER — Telehealth: Payer: Self-pay | Admitting: *Deleted

## 2019-03-28 ENCOUNTER — Encounter (HOSPITAL_COMMUNITY): Payer: Self-pay | Admitting: Gynecologic Oncology

## 2019-03-28 DIAGNOSIS — I2510 Atherosclerotic heart disease of native coronary artery without angina pectoris: Secondary | ICD-10-CM | POA: Diagnosis not present

## 2019-03-28 DIAGNOSIS — Z86718 Personal history of other venous thrombosis and embolism: Secondary | ICD-10-CM | POA: Diagnosis not present

## 2019-03-28 DIAGNOSIS — N950 Postmenopausal bleeding: Secondary | ICD-10-CM | POA: Diagnosis not present

## 2019-03-28 DIAGNOSIS — Z86010 Personal history of colonic polyps: Secondary | ICD-10-CM | POA: Diagnosis not present

## 2019-03-28 DIAGNOSIS — I10 Essential (primary) hypertension: Secondary | ICD-10-CM | POA: Diagnosis not present

## 2019-03-28 DIAGNOSIS — G4700 Insomnia, unspecified: Secondary | ICD-10-CM | POA: Insufficient documentation

## 2019-03-28 DIAGNOSIS — Z7901 Long term (current) use of anticoagulants: Secondary | ICD-10-CM | POA: Insufficient documentation

## 2019-03-28 DIAGNOSIS — Z8711 Personal history of peptic ulcer disease: Secondary | ICD-10-CM | POA: Insufficient documentation

## 2019-03-28 DIAGNOSIS — C541 Malignant neoplasm of endometrium: Secondary | ICD-10-CM | POA: Diagnosis present

## 2019-03-28 DIAGNOSIS — I252 Old myocardial infarction: Secondary | ICD-10-CM | POA: Insufficient documentation

## 2019-03-28 DIAGNOSIS — Z800 Family history of malignant neoplasm of digestive organs: Secondary | ICD-10-CM | POA: Insufficient documentation

## 2019-03-28 DIAGNOSIS — R7303 Prediabetes: Secondary | ICD-10-CM | POA: Insufficient documentation

## 2019-03-28 DIAGNOSIS — Z808 Family history of malignant neoplasm of other organs or systems: Secondary | ICD-10-CM | POA: Insufficient documentation

## 2019-03-28 DIAGNOSIS — E785 Hyperlipidemia, unspecified: Secondary | ICD-10-CM | POA: Diagnosis not present

## 2019-03-28 DIAGNOSIS — G4733 Obstructive sleep apnea (adult) (pediatric): Secondary | ICD-10-CM | POA: Insufficient documentation

## 2019-03-28 DIAGNOSIS — Z8673 Personal history of transient ischemic attack (TIA), and cerebral infarction without residual deficits: Secondary | ICD-10-CM | POA: Diagnosis not present

## 2019-03-28 DIAGNOSIS — Z6841 Body Mass Index (BMI) 40.0 and over, adult: Secondary | ICD-10-CM | POA: Diagnosis not present

## 2019-03-28 DIAGNOSIS — Z8249 Family history of ischemic heart disease and other diseases of the circulatory system: Secondary | ICD-10-CM | POA: Insufficient documentation

## 2019-03-28 DIAGNOSIS — I440 Atrioventricular block, first degree: Secondary | ICD-10-CM | POA: Insufficient documentation

## 2019-03-28 DIAGNOSIS — Z833 Family history of diabetes mellitus: Secondary | ICD-10-CM | POA: Insufficient documentation

## 2019-03-28 DIAGNOSIS — Z955 Presence of coronary angioplasty implant and graft: Secondary | ICD-10-CM | POA: Insufficient documentation

## 2019-03-28 DIAGNOSIS — Z79899 Other long term (current) drug therapy: Secondary | ICD-10-CM | POA: Insufficient documentation

## 2019-03-28 DIAGNOSIS — R911 Solitary pulmonary nodule: Secondary | ICD-10-CM | POA: Diagnosis not present

## 2019-03-28 DIAGNOSIS — K219 Gastro-esophageal reflux disease without esophagitis: Secondary | ICD-10-CM | POA: Insufficient documentation

## 2019-03-28 DIAGNOSIS — Z820 Family history of epilepsy and other diseases of the nervous system: Secondary | ICD-10-CM | POA: Insufficient documentation

## 2019-03-28 DIAGNOSIS — N8500 Endometrial hyperplasia, unspecified: Secondary | ICD-10-CM | POA: Diagnosis not present

## 2019-03-28 HISTORY — PX: DILATION AND CURETTAGE OF UTERUS: SHX78

## 2019-03-28 HISTORY — PX: INTRAUTERINE DEVICE (IUD) INSERTION: SHX5877

## 2019-03-28 LAB — TYPE AND SCREEN
ABO/RH(D): A POS
Antibody Screen: NEGATIVE

## 2019-03-28 SURGERY — DILATION AND CURETTAGE
Anesthesia: General

## 2019-03-28 MED ORDER — ONDANSETRON HCL 4 MG/2ML IJ SOLN
INTRAMUSCULAR | Status: DC | PRN
  Administered 2019-03-28: 4 mg via INTRAVENOUS

## 2019-03-28 MED ORDER — ACETAMINOPHEN 325 MG PO TABS: 325.0000 mg | ORAL_TABLET | ORAL | Status: DC | PRN

## 2019-03-28 MED ORDER — HYDROCODONE-ACETAMINOPHEN 5-325 MG PO TABS: 1.0000 | ORAL_TABLET | ORAL | Status: DC | PRN

## 2019-03-28 MED ORDER — SIMETHICONE 80 MG PO CHEW
80.0000 mg | CHEWABLE_TABLET | Freq: Four times a day (QID) | ORAL | Status: DC | PRN
  Filled 2019-03-28: qty 1

## 2019-03-28 MED ORDER — OXYCODONE HCL 5 MG/5ML PO SOLN: 5.0000 mg | Freq: Once | ORAL | Status: DC | PRN

## 2019-03-28 MED ORDER — LACTATED RINGERS IV SOLN: INTRAVENOUS | Status: DC

## 2019-03-28 MED ORDER — LIDOCAINE HCL (PF) 1 % IJ SOLN
INTRAMUSCULAR | Status: AC
  Filled 2019-03-28: qty 30

## 2019-03-28 MED ORDER — FENTANYL CITRATE (PF) 100 MCG/2ML IJ SOLN
INTRAMUSCULAR | Status: DC | PRN
  Administered 2019-03-28: 50 ug via INTRAVENOUS
  Administered 2019-03-28 (×2): 25 ug via INTRAVENOUS

## 2019-03-28 MED ORDER — LIDOCAINE 2% (20 MG/ML) 5 ML SYRINGE
INTRAMUSCULAR | Status: DC | PRN
  Administered 2019-03-28: 100 mg via INTRAVENOUS

## 2019-03-28 MED ORDER — PROPOFOL 10 MG/ML IV BOLUS
INTRAVENOUS | Status: AC
  Filled 2019-03-28: qty 20

## 2019-03-28 MED ORDER — KETOROLAC TROMETHAMINE 15 MG/ML IJ SOLN: 15.0000 mg | Freq: Once | INTRAMUSCULAR | Status: DC

## 2019-03-28 MED ORDER — OXYCODONE HCL 5 MG PO TABS: 5.0000 mg | ORAL_TABLET | Freq: Once | ORAL | Status: DC | PRN

## 2019-03-28 MED ORDER — FENTANYL CITRATE (PF) 100 MCG/2ML IJ SOLN: 25.0000 ug | INTRAMUSCULAR | Status: DC | PRN

## 2019-03-28 MED ORDER — ACETAMINOPHEN 160 MG/5ML PO SOLN: 325.0000 mg | ORAL | Status: DC | PRN

## 2019-03-28 MED ORDER — DEXAMETHASONE SODIUM PHOSPHATE 10 MG/ML IJ SOLN
INTRAMUSCULAR | Status: DC | PRN
  Administered 2019-03-28: 8 mg via INTRAVENOUS

## 2019-03-28 MED ORDER — PROPOFOL 10 MG/ML IV BOLUS
INTRAVENOUS | Status: AC
  Filled 2019-03-28: qty 40

## 2019-03-28 MED ORDER — ONDANSETRON HCL 4 MG/2ML IJ SOLN: 4.0000 mg | Freq: Once | INTRAMUSCULAR | Status: DC | PRN

## 2019-03-28 MED ORDER — MEPERIDINE HCL 50 MG/ML IJ SOLN: 6.2500 mg | INTRAMUSCULAR | Status: DC | PRN

## 2019-03-28 MED ORDER — MIDAZOLAM HCL 2 MG/2ML IJ SOLN
INTRAMUSCULAR | Status: AC
  Filled 2019-03-28: qty 2

## 2019-03-28 MED ORDER — DEXAMETHASONE SODIUM PHOSPHATE 10 MG/ML IJ SOLN
INTRAMUSCULAR | Status: AC
  Filled 2019-03-28: qty 1

## 2019-03-28 MED ORDER — ACETAMINOPHEN 500 MG PO TABS: 1000.0000 mg | ORAL_TABLET | Freq: Four times a day (QID) | ORAL | Status: DC

## 2019-03-28 MED ORDER — PROPOFOL 10 MG/ML IV BOLUS
INTRAVENOUS | Status: DC | PRN
  Administered 2019-03-28: 130 mg via INTRAVENOUS

## 2019-03-28 MED ORDER — FENTANYL CITRATE (PF) 100 MCG/2ML IJ SOLN
INTRAMUSCULAR | Status: AC
  Filled 2019-03-28: qty 2

## 2019-03-28 MED ORDER — LIDOCAINE 2% (20 MG/ML) 5 ML SYRINGE
INTRAMUSCULAR | Status: AC
  Filled 2019-03-28: qty 5

## 2019-03-28 MED ORDER — CEFAZOLIN SODIUM-DEXTROSE 2-4 GM/100ML-% IV SOLN
INTRAVENOUS | Status: AC
  Filled 2019-03-28: qty 100

## 2019-03-28 MED ORDER — ONDANSETRON HCL 4 MG/2ML IJ SOLN
INTRAMUSCULAR | Status: AC
  Filled 2019-03-28: qty 2

## 2019-03-28 MED ORDER — SILVER NITRATE-POT NITRATE 75-25 % EX MISC
CUTANEOUS | Status: AC
  Filled 2019-03-28: qty 10

## 2019-03-28 SURGICAL SUPPLY — 28 items
BACTOSHIELD CHG 4% 4OZ (MISCELLANEOUS) ×1
CATH ROBINSON RED A/P 16FR (CATHETERS) ×3 IMPLANT
CONT SPEC 4OZ CLIKSEAL STRL BL (MISCELLANEOUS) ×2 IMPLANT
COVER WAND RF STERILE (DRAPES) IMPLANT
DRAPE SHEET LG 3/4 BI-LAMINATE (DRAPES) ×3 IMPLANT
DRAPE UNDERBUTTOCKS STRL (DISPOSABLE) ×3 IMPLANT
DRSG TELFA 3X8 NADH (GAUZE/BANDAGES/DRESSINGS) ×3 IMPLANT
GAUZE 4X4 16PLY RFD (DISPOSABLE) ×3 IMPLANT
GLOVE BIO SURGEON STRL SZ 6 (GLOVE) ×6 IMPLANT
GOWN STRL REUS W/ TWL LRG LVL3 (GOWN DISPOSABLE) ×2 IMPLANT
GOWN STRL REUS W/TWL LRG LVL3 (GOWN DISPOSABLE) ×6
KIT BASIN OR (CUSTOM PROCEDURE TRAY) ×3 IMPLANT
KIT TURNOVER KIT A (KITS) IMPLANT
Mirena (levonorgestrel-releasing intrauterine syst ×2 IMPLANT
NDL SPNL 22GX3.5 QUINCKE BK (NEEDLE) ×1 IMPLANT
NEEDLE SPNL 22GX3.5 QUINCKE BK (NEEDLE) ×3 IMPLANT
NS IRRIG 1000ML POUR BTL (IV SOLUTION) ×3 IMPLANT
PACK LITHOTOMY IV (CUSTOM PROCEDURE TRAY) ×3 IMPLANT
PAD DRESSING TELFA 3X8 NADH (GAUZE/BANDAGES/DRESSINGS) ×1 IMPLANT
PAD OB MATERNITY 4.3X12.25 (PERSONAL CARE ITEMS) ×3 IMPLANT
PENCIL SMOKE EVACUATOR (MISCELLANEOUS) IMPLANT
SCRUB CHG 4% DYNA-HEX 4OZ (MISCELLANEOUS) ×2 IMPLANT
SURGILUBE 2OZ TUBE FLIPTOP (MISCELLANEOUS) ×3 IMPLANT
SYR BULB IRRIGATION 50ML (SYRINGE) ×3 IMPLANT
TOWEL OR 17X26 10 PK STRL BLUE (TOWEL DISPOSABLE) ×3 IMPLANT
TOWEL OR NON WOVEN STRL DISP B (DISPOSABLE) ×3 IMPLANT
UNDERPAD 30X36 HEAVY ABSORB (UNDERPADS AND DIAPERS) ×6 IMPLANT
YANKAUER SUCT BULB TIP 10FT TU (MISCELLANEOUS) ×1 IMPLANT

## 2019-03-28 NOTE — Anesthesia Procedure Notes (Signed)
Procedure Name: LMA Insertion Date/Time: 03/28/2019 7:38 AM Performed by: Eben Burow, CRNA Pre-anesthesia Checklist: Patient identified, Emergency Drugs available, Suction available, Patient being monitored and Timeout performed Patient Re-evaluated:Patient Re-evaluated prior to induction Oxygen Delivery Method: Circle system utilized Preoxygenation: Pre-oxygenation with 100% oxygen Induction Type: IV induction Ventilation: Mask ventilation without difficulty LMA: LMA inserted LMA Size: 4.0 Tube secured with: Tape Dental Injury: Teeth and Oropharynx as per pre-operative assessment

## 2019-03-28 NOTE — Anesthesia Preprocedure Evaluation (Addendum)
Anesthesia Evaluation  Patient identified by MRN, date of birth, ID band Patient awake    Reviewed: Allergy & Precautions, NPO status , Patient's Chart, lab work & pertinent test results, reviewed documented beta blocker date and time   Airway Mallampati: II  TM Distance: >3 FB Neck ROM: Full    Dental  (+) Dental Advisory Given, Upper Dentures   Pulmonary sleep apnea ,    Pulmonary exam normal breath sounds clear to auscultation       Cardiovascular hypertension, Pt. on medications and Pt. on home beta blockers + CAD, + Past MI and + Cardiac Stents  Normal cardiovascular exam Rhythm:Regular Rate:Normal     Neuro/Psych negative neurological ROS  negative psych ROS   GI/Hepatic Neg liver ROS, GERD  Medicated,  Endo/Other  Morbid obesity  Renal/GU negative Renal ROS     Musculoskeletal negative musculoskeletal ROS (+)   Abdominal (+) + obese,   Peds  Hematology   Anesthesia Other Findings Day of surgery medications reviewed with the patient.  Reproductive/Obstetrics                             Anesthesia Physical  Anesthesia Plan  ASA: III  Anesthesia Plan: General   Post-op Pain Management:    Induction: Intravenous  PONV Risk Score and Plan: 4 or greater and Dexamethasone and Ondansetron  Airway Management Planned: LMA  Additional Equipment: None  Intra-op Plan:   Post-operative Plan: Extubation in OR  Informed Consent: I have reviewed the patients History and Physical, chart, labs and discussed the procedure including the risks, benefits and alternatives for the proposed anesthesia with the patient or authorized representative who has indicated his/her understanding and acceptance.     Dental advisory given  Plan Discussed with: CRNA  Anesthesia Plan Comments:        Anesthesia Quick Evaluation

## 2019-03-28 NOTE — Interval H&P Note (Signed)
History and Physical Interval Note:  03/28/2019 6:58 AM  Shirley [name]  has presented today for surgery, with the diagnosis of ENDOMETRIAL CANCER.  The various methods of treatment have been discussed with the patient and family. After consideration of risks, benefits and other options for treatment, the patient has consented to  Procedure(s): DILATATION AND CURETTAGE OF UTERUS (N/A) INTRAUTERINE DEVICE (IUD) INSERTION (N/A) as a surgical intervention.  The patient's history has been reviewed, patient examined, no change in status, stable for surgery.  I have reviewed the patient's chart and labs.  Questions were answered to the patient's satisfaction.     Lafonda Mosses

## 2019-03-28 NOTE — Transfer of Care (Signed)
Immediate Anesthesia Transfer of Care Note  [name]  Procedure(s) Performed: DILATATION AND CURETTAGE OF UTERUS (N/A ) INTRAUTERINE DEVICE (IUD) INSERTION (N/A )  Patient Location: PACU  Anesthesia Type:General  Level of Consciousness: awake, alert  and oriented  Airway & Oxygen Therapy: Patient Spontanous Breathing and Patient connected to face mask oxygen  Post-op Assessment: Report given to RN and Post -op Vital signs reviewed and stable  Post vital signs: Reviewed and stable  Last Vitals:  Vitals Value Taken Time  BP 170/72 03/28/19 0818  Temp    Pulse 62 03/28/19 0819  Resp 20 03/28/19 0819  SpO2 100 % 03/28/19 0819  Vitals shown include unvalidated device data.  Last Pain:  Vitals:   03/28/19 0533  TempSrc: Oral         Complications: No apparent anesthesia complications

## 2019-03-28 NOTE — Anesthesia Postprocedure Evaluation (Signed)
Anesthesia Post Note  [name]  Procedure(s) Performed: DILATATION AND CURETTAGE OF UTERUS (N/A ) INTRAUTERINE DEVICE (IUD) INSERTION (N/A )     Patient location during evaluation: Phase II Anesthesia Type: General Level of consciousness: awake Pain management: pain level controlled Respiratory status: spontaneous breathing Cardiovascular status: stable Postop Assessment: no apparent nausea or vomiting Anesthetic complications: no    Last Vitals:  Vitals:   03/28/19 0830 03/28/19 0845  BP: (!) 175/79 (!) 166/88  Pulse: (!) 57 60  Resp: 17 15  Temp:  36.5 C  SpO2: 96% 99%    Last Pain:  Vitals:   03/28/19 0845  TempSrc:   PainSc: 0-No pain   Pain Goal:                   Huston Foley

## 2019-03-28 NOTE — Op Note (Signed)
[name]. Shirley [name] DATE: 03/28/19   Preop Diagnosis: Endometrial adenocarcinoma  Postoperative Diagnosis: same  Surgery: D&C (dilation and curettage), Mirena IUD insertion  Surgeons:  Jeral Pinch, MD Assistant: none  Anesthesia: General   Estimated blood loss: 15 ml  Urine output: none, voided prior to surgery   Complications: None apparent  Pathology: endometrial curetteings  Operative findings: 10-12 cm mobile and anteverted uterus. On curettage, fleshy tissue noted, likely tumor. No parametrial nodulairt.  Procedure: The patient was identified in the preoperative holding area. Informed consent was signed on the chart. Patient was seen history was reviewed and exam was performed.   The patient was then taken to the operating room and placed in the supine position with SCD hose on. General anesthesia was then induced without difficulty. She was then placed in the dorsolithotomy position. The perineum was prepped with Betadine. The vagina was prepped with Betadine. The patient was then draped in the normal sterile fashion.  Timeout was performed the patient, procedure, antibiotic, allergy, and length of procedure.   The speculum was placed in the vagina. The single tooth tenaculum was placed on the anterior lip of the cervix. The uterine sound was placed in the cervix and advanced to the fundus to 10cm. The cervix was already significantly dilated (>1cm). A sharp curette was advanced to the fundus and a comprehensive curette of the endometrial cavity took place until good uterine cri was appreciated. The specimen was collected on a telfa and sent for permanent pathology.  Mirena IUD Lot #[id number], Exp 02/2021 was then inserted to the uterine fundus and deployed. The inserter was removed and IUD strings cut at 2-3 cm.  The tenaculum was removed and hemostasis was observed.   All instrument, suture, laparotomy, Ray-Tec, and needle counts were correct  x2. The patient tolerated the procedure well and was taken recovery room in stable condition.   Jeral Pinch MD Gynecologic Oncology

## 2019-03-28 NOTE — Telephone Encounter (Signed)
Fax 12/7 and CT scan notes to Dr Alan Ripper office

## 2019-03-28 NOTE — Discharge Instructions (Signed)
03/28/2019  Activity: 1. Be up and out of the bed during the day.  Take a nap if needed.  You may walk up steps but be careful and use the hand rail.     2. No driving until you feel that you can break safely.   3. Shower as usual. Don't take baths for 4 weeks.   4. No sexual activity and nothing in the vagina for 4 weeks.  5. Some bleeding is normal after the procedure. If you are having heavy bleeding, please call our clinic to let us know.   Medications:  - Take ibuprofen and tylenol first for pain control. Some minimal cramping is normal after the procedure and should resolve in 1-3 days.  Diet: 1. Low sodium Heart Healthy Diet is recommended.  2. It is safe to use a laxative if you have difficulty moving your bowels.   Wound Care: None  Reasons to call the Doctor:   Fever - Oral temperature greater than 100.4 degrees Fahrenheit  Foul-smelling vaginal discharge  Difficulty urinating  Nausea and vomiting  Increased pain  Difficulty breathing with or without chest pain  New calf pain especially if only on one side  Sudden, continuing increased vaginal bleeding with or without clots.   Follow-up: 1. Phone call with Dr. Berline Lopes in 1 week.  Contacts: For questions or concerns you should contact:  Dr. Jeral Pinch at [phone] After hours and on week-ends call [phone] and ask to speak to the physician on call for Gynecologic Oncology  Restart all medications as prescribed by your physician.

## 2019-03-29 ENCOUNTER — Telehealth: Payer: Self-pay

## 2019-03-29 NOTE — Telephone Encounter (Signed)
LM for daughter stating that this call is to follow up to see how her mother is after her surgery yesterday.

## 2019-03-29 NOTE — Telephone Encounter (Signed)
Daughter Shirley [name] returned my call. Her mother is doing well. She is eating, drinking , and urinating well. Passing gas. Suggested stool softener or mild laxative if no BM by tomorrow midday. She does not want to go longer then 3 days without BM. Mother not in pain.  She is having some vaginal bleeding.  It is lighter today.   Told the daughter that this is normal and it should continue to get lighter to none in the next few days.  She needs to call the office if the bleeding is like a period. Her mother is up and ambulating. Daughter knows to call the office if she has any questions or concerns.

## 2019-04-02 LAB — SURGICAL PATHOLOGY

## 2019-04-03 ENCOUNTER — Inpatient Hospital Stay (HOSPITAL_BASED_OUTPATIENT_CLINIC_OR_DEPARTMENT_OTHER): Payer: Medicare Other | Admitting: Gynecologic Oncology

## 2019-04-03 ENCOUNTER — Other Ambulatory Visit: Payer: Self-pay

## 2019-04-03 ENCOUNTER — Encounter: Payer: Self-pay | Admitting: *Deleted

## 2019-04-03 DIAGNOSIS — C541 Malignant neoplasm of endometrium: Secondary | ICD-10-CM

## 2019-04-03 NOTE — Progress Notes (Signed)
Gynecologic Oncology Telehealth Consult Note: Gyn-Onc  I connected with Shirley [name] on 04/03/19 at  3:30 PM EST by telephone and verified that I am speaking with the correct person using two identifiers.  I discussed the limitations, risks, security and privacy concerns of performing an evaluation and management service by telemedicine and the availability of in-person appointments. I also discussed with the patient that there may be a patient responsible charge related to this service. The patient expressed understanding and agreed to proceed.  Other persons participating in the visit and their role in the encounter: daughter, Caren Griffins.  Patient's location: home Provider's location: [facility]  Chief Complaint: Postop, discussion regarding surgical pathology  Interval History: The patient underwent D&C and Mirena IUD insertion last week for grade 1 endometrioid endometrial adenocarcinoma on recent biopsy.  Given her comorbidities, I recommended medical management at this time until she is cleared for surgery by her PCP and cardiologist.  The patient has done well after surgery.  She had some bleeding initially that has become very light.  She denies any pelvic pain or cramping.  She denies fevers or chills.  She reports good appetite without nausea or emesis.  She is voiding without difficulty and reports regular bowel function.  Past Medical/Surgical History: Past Medical History:  Diagnosis Date  . Anemia   . BMI 40.0-44.9, adult ([place])   . CAD in native artery, stent to RCA BMS with MI 2015 11/04/2003   a. inferior STEMI 01/2014 s/p BMS to prox RCA.  [name] DVT (deep venous thrombosis) ([place]) 2003   lower extremity DVT with questionable recurrence in 2005. Treated with 1.5 year course of coumadin. Another admission 2015.  . Endometrial cancer ([place])   . Endometrial thickening on ultra sound 07/2009   path showing Fragments of benign endocervical and squamous mucosa. No  dysplasia or malignancy // Followed by Dr. [name]  . First degree AV block   . Gastric ulcer   . GERD (gastroesophageal reflux disease)   . GI bleed   . Hemorrhoid   . Hyperlipidemia LDL goal <70   . Hypertension   . Insomnia 11/02/2010  . Memory difficulties 11/02/2010  . Mild dilation of ascending aorta ([facility])   . Morbid obesity ([place]) 09/16/2012  . Obesity   . Obstructive sleep apnea 09/23/2010  . Prediabetes DX: 09/2010   HgA1c 6.1  . Pulmonary nodule   . ST elevation myocardial infarction (STEMI) of inferior wall ([place]) 01/24/2014  . Stroke ([place]) 2015  . Syncope 12/18/2013  . Syncope, near 12/14/2018  . TIA (transient ischemic attack) 09/23/2010   09/13/2010: CT, MRI/MRA no acute process. Carotid doppler: no stenosis. 2D-Echo Normal LV function with an EF 55-60%.   [name] UTI (urinary tract infection) 01/27/2014    Past Surgical History:  Procedure Laterality Date  . COLONOSCOPY W/ POLYPECTOMY  11/01/2007   8 mm rectal adenoma, hemorrhoids  . CORONARY ANGIOPLASTY WITH STENT PLACEMENT  01/24/14   Inf. STEMI, BMS to RCA  . DILATION AND CURETTAGE OF UTERUS N/A 03/28/2019   Procedure: DILATATION AND CURETTAGE OF UTERUS;  Surgeon: Lafonda Mosses, MD;  Location: [facility];  Service: Gynecology;  Laterality: N/A;  . ESOPHAGOGASTRODUODENOSCOPY N/A 10/06/2018   Procedure: ESOPHAGOGASTRODUODENOSCOPY (EGD);  Surgeon: Doran Stabler, MD;  Location: [facility];  Service: Gastroenterology;  Laterality: N/A;  . HOT HEMOSTASIS N/A 10/06/2018   Procedure: HOT HEMOSTASIS (ARGON PLASMA COAGULATION/BICAP);  Surgeon: Doran Stabler, MD;  Location: [facility];  Service:  Gastroenterology;  Laterality: N/A;  . INTRAUTERINE DEVICE (IUD) INSERTION N/A 03/28/2019   Procedure: INTRAUTERINE DEVICE (IUD) INSERTION;  Surgeon: Lafonda Mosses, MD;  Location: [facility];  Service: Gynecology;  Laterality: N/A;  . KNEE SURGERY  1996   Left knee arthroscopy.  [name] LEFT HEART CATHETERIZATION WITH CORONARY  ANGIOGRAM N/A 01/24/2014   Procedure: LEFT HEART CATHETERIZATION WITH CORONARY ANGIOGRAM;  Surgeon: Burnell Blanks, MD;  Location: [facility] CATH LAB;  Service: Cardiovascular;  Laterality: N/A;  . ROTATOR CUFF REPAIR     right.  [name]  10/06/2018   Procedure: [name];  Surgeon: Doran Stabler, MD;  Location: [facility] ENDOSCOPY;  Service: Gastroenterology;;  . TUBAL LIGATION      Family History  Problem Relation Age of Onset  . Diabetes Mother   . Hyperlipidemia Mother   . Heart disease Mother   . Dementia Mother   . Angina Father   . Heart attack Father   . Thyroid cancer Sister   . Liver cancer Sister   . Heart disease Brother        x 2 in 76s yo  . Alzheimer's disease Maternal Grandmother   . Stroke Neg Hx   . Colon cancer Neg Hx   . Esophageal cancer Neg Hx   . Rectal cancer Neg Hx   . Stomach cancer Neg Hx   . Ovarian cancer Neg Hx   . Endometrial cancer Neg Hx     Social History   Socioeconomic History  . Marital status: Married    Spouse name: Not on file  . Number of children: 6  . Years of education: College  . Highest education level: Not on file  Occupational History  . Occupation: Control and instrumentation engineer  . Occupation: TEACHER- [employer]    Employer: [employer]  Tobacco Use  . Smoking status: Never Smoker  . Smokeless tobacco: Never Used  Substance and Sexual Activity  . Alcohol use: No  . Drug use: No  . Sexual activity: Not on file  Other Topics Concern  . Not on file  Social History Narrative   Full Code status.   Insurance: BCBS   Social Determinants of Health   Financial Resource Strain:   . Difficulty of Paying Living Expenses: Not on file  Food Insecurity:   . Worried About Charity fundraiser in the Last Year: Not on file  . Ran Out of Food in the Last Year: Not on file  Transportation Needs:   . Lack of Transportation (Medical): Not on file  . Lack of Transportation (Non-Medical): Not on file  Physical  Activity:   . Days of Exercise per Week: Not on file  . Minutes of Exercise per Session: Not on file  Stress:   . Feeling of Stress : Not on file  Social Connections:   . Frequency of Communication with Friends and Family: Not on file  . Frequency of Social Gatherings with Friends and Family: Not on file  . Attends Religious Services: Not on file  . Active Member of Clubs or Organizations: Not on file  . Attends Archivist Meetings: Not on file  . Marital Status: Not on file    Current Medications:  Current Outpatient Medications:  .  acetaminophen (TYLENOL) 500 MG tablet, Take 500 mg by mouth every 6 (six) hours as needed for moderate pain. , Disp: , Rfl:  .  apixaban (ELIQUIS) 2.5 MG TABS tablet, Take 1 tablet (2.5 mg total) by  mouth 2 (two) times daily., Disp: 60 tablet, Rfl: 5 .  atorvastatin (LIPITOR) 80 MG tablet, Take 1 tablet (80 mg total) by mouth daily., Disp: 90 tablet, Rfl: 3 .  Cholecalciferol (VITAMIN D3) 10 MCG (400 UNIT) CAPS, Take 400 Units by mouth daily., Disp: , Rfl:  .  diphenhydrAMINE (BENADRYL) 50 MG capsule, 50 mg of benadryl orally administered 1 hour prior to your CT scan. Do not take and drive, Disp: 1 capsule, Rfl: 0 .  ferrous sulfate 325 (65 FE) MG tablet, Take 1 tablet (325 mg total) by mouth 2 (two) times daily with a meal. (Patient taking differently: Take 325 mg by mouth daily with breakfast. ), Disp: , Rfl: 3 .  levonorgestrel (MIRENA) 20 MCG/24HR IUD, 1 Intra Uterine Device (1 each total) by Intrauterine route once for 1 dose., Disp: 1 each, Rfl: 0 .  lisinopril (ZESTRIL) 40 MG tablet, Take 40 mg by mouth daily., Disp: , Rfl:  .  metoprolol succinate (TOPROL-XL) 25 MG 24 hr tablet, Take 1 tablet (25 mg total) by mouth daily., Disp: 90 tablet, Rfl: 3 .  Multiple Vitamin (MULTIVITAMIN WITH MINERALS) TABS, Take 1 tablet by mouth every morning., Disp: , Rfl:  .  nitroGLYCERIN (NITROSTAT) 0.4 MG SL tablet, Place 1 tablet (0.4 mg total) under the  tongue every 5 (five) minutes x 3 doses as needed for chest pain., Disp: 25 tablet, Rfl: 3 .  pantoprazole (PROTONIX) 40 MG tablet, Take 1 tablet (40 mg total) by mouth daily. (Patient taking differently: Take 40 mg by mouth 2 (two) times daily. ), Disp: 90 tablet, Rfl: 3 .  predniSONE (DELTASONE) 50 MG tablet, Three doses of 50 mg oral prednisone administered 13, 7, and 1 hour prior to your CT scan and then 50 mg of benadryl orally administered 1 hour prior to your CT scan., Disp: 3 tablet, Rfl: 0  Review of Symptoms: Pertinent positives as per HPI.  Otherwise review of systems negative.  Physical Exam: There were no vitals taken for this visit. Not performed given limitations of telephone visit.  Laboratory & Radiologic Studies: A. ENDOMETRIUM, CURRETAGE:  - Endometrioid carcinoma, FIGO Grade 1-2   Assessment & Plan: Shirley [name] is a 77 y.o. woman with clinical stage I endometrial cancer now 1 week status post D&C and Mirena IUD insertion for medical management of her cancer in the setting of her multiple comorbidities.  I reviewed the final pathology from surgery last week with the patient and her daughter.  I also discussed CT findings showing no evidence of metastatic disease.  Given this, I recommended continuing with medical management in the hope that we will be able to proceed with surgery in the next 6 months.  The patient is scheduled to see her primary care provider in January and will my office has sent clearance forms for surgery to both her PCP as well as her cardiologist.  The patient is aware of her visit with me in March.  At that time we will repeat a biopsy to see if she has had response to progesterone therapy.  We will also reevaluate in the setting of the Covid pandemic as well as her clearance for surgery by multiple providers whether it is time to schedule surgery.  All the patient's questions as well as her daughters were answered today.  I discussed the  assessment and treatment plan with the patient. The patient was provided with an opportunity to ask questions and all were answered. The patient agreed with  the plan and demonstrated an understanding of the instructions.   The patient was advised to call back or see an in-person evaluation if the symptoms worsen or if the condition fails to improve as anticipated.   I provided 12 minutes of non face-to-face telephone visit time during this encounter, and > 50% was spent counseling as documented under my assessment & plan.  Jeral Pinch, MD  Division of Gynecologic Oncology  Department of Obstetrics and Gynecology  [facility]

## 2019-04-06 DIAGNOSIS — C541 Malignant neoplasm of endometrium: Secondary | ICD-10-CM

## 2019-04-06 HISTORY — DX: Malignant neoplasm of endometrium: C54.1

## 2019-04-17 DIAGNOSIS — E782 Mixed hyperlipidemia: Secondary | ICD-10-CM | POA: Diagnosis not present

## 2019-04-17 DIAGNOSIS — I10 Essential (primary) hypertension: Secondary | ICD-10-CM | POA: Diagnosis not present

## 2019-04-17 DIAGNOSIS — N390 Urinary tract infection, site not specified: Secondary | ICD-10-CM | POA: Diagnosis not present

## 2019-04-17 DIAGNOSIS — E1165 Type 2 diabetes mellitus with hyperglycemia: Secondary | ICD-10-CM | POA: Diagnosis not present

## 2019-04-18 ENCOUNTER — Ambulatory Visit: Payer: Medicare Other | Admitting: Gynecologic Oncology

## 2019-04-26 DIAGNOSIS — E1165 Type 2 diabetes mellitus with hyperglycemia: Secondary | ICD-10-CM | POA: Diagnosis not present

## 2019-04-26 DIAGNOSIS — I2510 Atherosclerotic heart disease of native coronary artery without angina pectoris: Secondary | ICD-10-CM | POA: Diagnosis not present

## 2019-04-26 DIAGNOSIS — Z01818 Encounter for other preprocedural examination: Secondary | ICD-10-CM | POA: Diagnosis not present

## 2019-04-26 DIAGNOSIS — I10 Essential (primary) hypertension: Secondary | ICD-10-CM | POA: Diagnosis not present

## 2019-04-26 DIAGNOSIS — E782 Mixed hyperlipidemia: Secondary | ICD-10-CM | POA: Diagnosis not present

## 2019-05-31 ENCOUNTER — Telehealth: Payer: Self-pay

## 2019-05-31 NOTE — Progress Notes (Signed)
[place]   [phone] [phone]   Clinic Follow up Note   Patient Care Team: Merrilee Seashore, MD as PCP - General (Internal Medicine) Burnell Blanks, MD as PCP - Cardiology (Cardiology)  I connected with Shirley [name] on 06/04/2019 at 10:00 AM EST by telephone visit and verified that I am speaking with the correct person using two identifiers.  I discussed the limitations, risks, security and privacy concerns of performing an evaluation and management service by telephone and the availability of in person appointments. I also discussed with the patient that there may be a patient responsible charge related to this service. The patient expressed understanding and agreed to proceed.   Patient's location:  Her home  Provider's location:  My Office    CHIEF COMPLAINT: F/u of Recurrent DVT, GI bleed on anticoagulation    History of Illness:  1. Left LE DVT after surgery in 2012, treated with short course of coumadin  2. In June of 2014 she complained of pain in her right leg. There had been no specific provoking event leading to this symptom. Evaluation showed a DVT again in the left leg. She was started on Xarelto.  3. She presents to the ED ON 12/18/2013 after an episode of near syncope at home. Collapsed to ground but no LOC. No injury with this. Had some upper left-sided headache but this is like her usual headaches. Has h/o DVT x2 in the past, on chronic xarelto, legs were swollen. Bilateral lower extremity venous duplex completed. Bilateral lower extremities are positive for deep vein thrombosis involving bilateral posterior tibial and left peroneal veins. There is no evidence of Baker's cyst bilaterally. CTA without pulmonary embolism. Placed on IV heparin 12.5 ml/hr and Warfarin 7.5 mg/daily (dosing per pharmacy), Xarelto discontinued. She was placed on Coumadin.  4. Aspirin 81 mg was added in 2015 after MI.  5. She was evaluated in  the ED on 10/05/2018 for weakness, epigastric discomfort, brown emesis, and near syncope. She was found to be anemic with Hgb 9.6 and heme positive stool. Hemoglobin dropped to 7.9 and she was transfused 2 units packed red cells and FFP. She underwent upper endoscopy by Dr. Loletha Carrow on 10/06/2018 which showed a large amount of fresh and clotted blood in the gastric fundus and in the gastric body as well as an oozing gastric ulcer which was cauterized.  Aspirin and Coumadin were held and she was treated with PPI.  6. Repeat endoscopy on 11/06/18 showed resolution of the gastric ulcer. Hpylori was eradicated. She remained off anticoagulation until 8/11 when she was started on prophylactic dose of Eliquis 2.5 mg BID per Dr. Ashby Dawes. She did not resume aspirin    CURRENT THERAPY:  Eliquis 2.5mg  BID daily (she has been taking 2.5mg  daily for a while)    INTERVAL HISTORY:  Shirley [name] is here for a follow up of DVT management. She notes her BP has been recently high and has improved lately. She was recently diagnosed with endometrial cancer. Mirena was placed for now. Surgery has been postponed. She denies pain from IUD and no bleeding. She denies bruising from her Eliquis which she has been only taking 2.5mg  once a day changed by her PCP. She notes her LE edema, L>R has much improved. She notes this is chronic for the past 45 years and fluctuates. She first noticed it when she was pregnant. She notes she has recent difficultly when swallowing with food getting stuck mid chest.  She notes she would like to see Dr. Loletha Carrow soon for follow up. She notes she has received both her COVID19 vaccinations.    REVIEW OF SYSTEMS:   Constitutional: Denies fevers, chills or abnormal weight loss Eyes: Denies blurriness of vision Ears, nose, mouth, throat, and face: Denies mucositis or sore throat (+) Mild dysphagia  Respiratory: Denies cough, dyspnea or wheezes  Cardiovascular: Denies palpitation, chest discomfort  (+) Chronic lower extremity swelling, L>R Gastrointestinal:  Denies nausea, heartburn or change in bowel habits Skin: Denies abnormal skin rashes Lymphatics: Denies new lymphadenopathy or easy bruising Neurological:Denies numbness, tingling or new weaknesses Behavioral/Psych: Mood is stable, no new changes  All other systems were reviewed with the patient and are negative.  MEDICAL HISTORY:  Past Medical History:  Diagnosis Date  . Anemia   . BMI 40.0-44.9, adult ([place])   . CAD in native artery, stent to RCA BMS with MI 2015 11/04/2003   a. inferior STEMI 01/2014 s/p BMS to prox RCA.  [name] DVT (deep venous thrombosis) ([place]) 2003   lower extremity DVT with questionable recurrence in 2005. Treated with 1.5 year course of coumadin. Another admission 2015.  . Endometrial cancer ([place])   . Endometrial thickening on ultra sound 07/2009   path showing Fragments of benign endocervical and squamous mucosa. No dysplasia or malignancy // Followed by Dr. [name]  . First degree AV block   . Gastric ulcer   . GERD (gastroesophageal reflux disease)   . GI bleed   . Hemorrhoid   . Hyperlipidemia LDL goal <70   . Hypertension   . Insomnia 11/02/2010  . Memory difficulties 11/02/2010  . Mild dilation of ascending aorta ([facility])   . Morbid obesity ([place]) 09/16/2012  . Obesity   . Obstructive sleep apnea 09/23/2010  . Prediabetes DX: 09/2010   HgA1c 6.1  . Pulmonary nodule   . ST elevation myocardial infarction (STEMI) of inferior wall ([place]) 01/24/2014  . Stroke ([place]) 2015  . Syncope 12/18/2013  . Syncope, near 12/14/2018  . TIA (transient ischemic attack) 09/23/2010   09/13/2010: CT, MRI/MRA no acute process. Carotid doppler: no stenosis. 2D-Echo Normal LV function with an EF 55-60%.   [name] UTI (urinary tract infection) 01/27/2014    SURGICAL HISTORY: Past Surgical History:  Procedure Laterality Date  . COLONOSCOPY W/ POLYPECTOMY  11/01/2007   8 mm rectal adenoma, hemorrhoids  . CORONARY ANGIOPLASTY  WITH STENT PLACEMENT  01/24/14   Inf. STEMI, BMS to RCA  . DILATION AND CURETTAGE OF UTERUS N/A 03/28/2019   Procedure: DILATATION AND CURETTAGE OF UTERUS;  Surgeon: Lafonda Mosses, MD;  Location: [facility];  Service: Gynecology;  Laterality: N/A;  . ESOPHAGOGASTRODUODENOSCOPY N/A 10/06/2018   Procedure: ESOPHAGOGASTRODUODENOSCOPY (EGD);  Surgeon: Doran Stabler, MD;  Location: [facility];  Service: Gastroenterology;  Laterality: N/A;  . HOT HEMOSTASIS N/A 10/06/2018   Procedure: HOT HEMOSTASIS (ARGON PLASMA COAGULATION/BICAP);  Surgeon: Doran Stabler, MD;  Location: [facility];  Service: Gastroenterology;  Laterality: N/A;  . INTRAUTERINE DEVICE (IUD) INSERTION N/A 03/28/2019   Procedure: INTRAUTERINE DEVICE (IUD) INSERTION;  Surgeon: Lafonda Mosses, MD;  Location: [facility];  Service: Gynecology;  Laterality: N/A;  . KNEE SURGERY  1996   Left knee arthroscopy.  [name] LEFT HEART CATHETERIZATION WITH CORONARY ANGIOGRAM N/A 01/24/2014   Procedure: LEFT HEART CATHETERIZATION WITH CORONARY ANGIOGRAM;  Surgeon: Burnell Blanks, MD;  [facility];  Service: Cardiovascular;  Laterality: N/A;  . ROTATOR CUFF REPAIR  right.  . SCLEROTHERAPY  10/06/2018   Procedure: SCLEROTHERAPY;  Surgeon: Doran Stabler, MD;  Location: [facility];  Service: Gastroenterology;;  . TUBAL LIGATION      I have reviewed the social history and family history with the patient and they are unchanged from previous note.  ALLERGIES:  is allergic to iohexol.  MEDICATIONS:  Current Outpatient Medications  Medication Sig Dispense Refill  . acetaminophen (TYLENOL) 500 MG tablet Take 500 mg by mouth every 6 (six) hours as needed for moderate pain.     [name] apixaban (ELIQUIS) 2.5 MG TABS tablet Take 1 tablet (2.5 mg total) by mouth 2 (two) times daily. 60 tablet 5  . atorvastatin (LIPITOR) 80 MG tablet Take 1 tablet (80 mg total) by mouth daily. 90 tablet 3  . Cholecalciferol (VITAMIN D3) 10 MCG  (400 UNIT) CAPS Take 400 Units by mouth daily.    . diphenhydrAMINE (BENADRYL) 50 MG capsule 50 mg of benadryl orally administered 1 hour prior to your CT scan. Do not take and drive 1 capsule 0  . ferrous sulfate 325 (65 FE) MG tablet Take 1 tablet (325 mg total) by mouth 2 (two) times daily with a meal. (Patient taking differently: Take 325 mg by mouth daily with breakfast. )  3  . levonorgestrel (MIRENA) 20 MCG/24HR IUD 1 Intra Uterine Device (1 each total) by Intrauterine route once for 1 dose. 1 each 0  . lisinopril (ZESTRIL) 40 MG tablet Take 40 mg by mouth daily.    . metoprolol succinate (TOPROL-XL) 25 MG 24 hr tablet Take 1 tablet (25 mg total) by mouth daily. 90 tablet 3  . Multiple Vitamin (MULTIVITAMIN WITH MINERALS) TABS Take 1 tablet by mouth every morning.    . nitroGLYCERIN (NITROSTAT) 0.4 MG SL tablet Place 1 tablet (0.4 mg total) under the tongue every 5 (five) minutes x 3 doses as needed for chest pain. 25 tablet 3  . pantoprazole (PROTONIX) 40 MG tablet Take 1 tablet (40 mg total) by mouth daily. (Patient taking differently: Take 40 mg by mouth 2 (two) times daily. ) 90 tablet 3  . predniSONE (DELTASONE) 50 MG tablet Three doses of 50 mg oral prednisone administered 13, 7, and 1 hour prior to your CT scan and then 50 mg of benadryl orally administered 1 hour prior to your CT scan. 3 tablet 0   No current facility-administered medications for this visit.    PHYSICAL EXAMINATION: ECOG PERFORMANCE STATUS: 1 - Symptomatic but completely ambulatory  No vitals taken today, Exam not performed today  LABORATORY DATA:  I have reviewed the data as listed CBC Latest Ref Rng & Units 03/26/2019 01/20/2019 12/15/2018  WBC 4.0 - 10.5 K/uL 8.1 8.3 7.2  Hemoglobin 12.0 - 15.0 g/dL 12.4 11.9(L) 10.9(L)  Hematocrit 36.0 - 46.0 % 40.4 35.7(L) 32.6(L)  Platelets 150 - 400 K/uL 218 242 261     CMP Latest Ref Rng & Units 03/26/2019 03/19/2019 01/20/2019  Glucose 70 - 99 mg/dL 100(H) 101(H)  122(H)  BUN 8 - 23 mg/dL 15 12 7(L)  Creatinine 0.44 - 1.00 mg/dL 0.60 0.70 0.58  Sodium 135 - 145 mmol/L 139 140 130(L)  Potassium 3.5 - 5.1 mmol/L 3.9 4.3 3.2(L)  Chloride 98 - 111 mmol/L 106 105 97(L)  CO2 22 - 32 mmol/L 26 27 23   Calcium 8.9 - 10.3 mg/dL 8.8(L) 8.7(L) 8.6(L)  Total Protein 6.5 - 8.1 g/dL 6.3(L) - -  Total Bilirubin 0.3 - 1.2 mg/dL 0.8 - -  Alkaline Phos 38 - 126 U/L 110 - -  AST 15 - 41 U/L 20 - -  ALT 0 - 44 U/L 19 - -     D&C by Dr. Berline Lopes  FINAL MICROSCOPIC DIAGNOSIS: 03/27/20 A. ENDOMETRIUM, CURRETAGE:  - Endometrioid carcinoma, FIGO Grade 1-2  - See comment    CT AP W contrast 03/26/20 IMPRESSION: 1. Diffuse endometrial thickening, consistent with known endometrial carcinoma. 2. No evidence of metastatic disease within the abdomen or pelvis. 3. Several small uterine fibroids, largest measuring 4 cm. Aortic Atherosclerosis (ICD10-I70.0).   RADIOGRAPHIC STUDIES: I have personally reviewed the radiological images as listed and agreed with the findings in the report. No results found.   ASSESSMENT & PLAN:  Shirley [name] is a 79 y.o. female with    1. Recurrent DVT, Chronic LE edema (L>R) -She has had recurrent DVT while on anticoagulation, previously on Xarelto, and most recently developed large upper GI bleed while on Coumadin and aspirin 81 mg. Her bleeding ulcer resolved and she recently started prophylactic dose of Eliquis 2.5 mg BID per PCP. She is tolerating well so far.  -She notes she is mostly stable lately. She has edema of b/l, L>R, which has been chronic to her since her 30's. Swelling does fluctuate and currently are normal.  -I discussed her high risk for thrombosis due to her recent endometrial cancer and IUD. Per patient her PCP reduced her Eliquis to 2.5mg  once daily. I recommend increasing Eliquis to 2.5mg  BID which is still half dose of full anticoagulation. I reviewed the risk of bleeding will increase but her ulcer has  resolved previously. She understands and agrees with the plan.  -F/u in 3 months    2. HTNCAD, history of recent MI and stroke, HTN -Her BP has improved recently. Continue medications.    3. Obesity, sedentary lifestyle, knee pain   4. Endometrial Carcinoma, Grade 1-2 -She is currently on medical management until he is cleared by PCP and cardiologist  -She underwent D&C on 03/28/19 to have Mirena IUD placed for progesterone therapy.  -She is being seen yb Gyn Dr. Jeral Pinch   5. Mild Dysphagia  -She notes she has recent difficultly when swallowing with food getting stuck mid chest.  -She plans to f/u with Dr. Loletha Carrow soon. I encouraged her to proceed.    PLAN:  -Increase Eliquis to 2.5mg  BID, she knows to watch for bleeding  -Lab and F/u in 3 months    No problem-specific Assessment & Plan notes found for this encounter.   No orders of the defined types were placed in this encounter.  I discussed the assessment and treatment plan with the patient. The patient was provided an opportunity to ask questions and all were answered. The patient agreed with the plan and demonstrated an understanding of the instructions.  The patient was advised to call back or seek an in-person evaluation if the symptoms worsen or if the condition fails to improve as anticipated.  The total time spent in the appointment was 22 minutes.     Truitt Merle, MD 06/04/2019   I, Joslyn Devon, am acting as scribe for Truitt Merle, MD.   I have reviewed the above documentation for accuracy and completeness, and I agree with the above.

## 2019-05-31 NOTE — Telephone Encounter (Signed)
TC from Pt's daughter stating she would like to know if her mother's visit could be a phone visit. Informed Dr. Burr Medico OK for visit to be phone visit.

## 2019-06-04 ENCOUNTER — Other Ambulatory Visit: Payer: Medicare Other

## 2019-06-04 ENCOUNTER — Telehealth: Payer: Self-pay | Admitting: Hematology

## 2019-06-04 ENCOUNTER — Inpatient Hospital Stay: Payer: Medicare Other | Attending: Nurse Practitioner | Admitting: Hematology

## 2019-06-04 ENCOUNTER — Encounter: Payer: Self-pay | Admitting: Hematology

## 2019-06-04 DIAGNOSIS — E785 Hyperlipidemia, unspecified: Secondary | ICD-10-CM | POA: Insufficient documentation

## 2019-06-04 DIAGNOSIS — Z86718 Personal history of other venous thrombosis and embolism: Secondary | ICD-10-CM | POA: Diagnosis not present

## 2019-06-04 DIAGNOSIS — I252 Old myocardial infarction: Secondary | ICD-10-CM | POA: Insufficient documentation

## 2019-06-04 DIAGNOSIS — C541 Malignant neoplasm of endometrium: Secondary | ICD-10-CM | POA: Insufficient documentation

## 2019-06-04 DIAGNOSIS — Z8673 Personal history of transient ischemic attack (TIA), and cerebral infarction without residual deficits: Secondary | ICD-10-CM | POA: Insufficient documentation

## 2019-06-04 DIAGNOSIS — Z975 Presence of (intrauterine) contraceptive device: Secondary | ICD-10-CM | POA: Insufficient documentation

## 2019-06-04 DIAGNOSIS — I10 Essential (primary) hypertension: Secondary | ICD-10-CM | POA: Insufficient documentation

## 2019-06-04 DIAGNOSIS — K219 Gastro-esophageal reflux disease without esophagitis: Secondary | ICD-10-CM | POA: Insufficient documentation

## 2019-06-04 NOTE — Telephone Encounter (Signed)
Scheduled per 3/1 sch msg. Left voicemail with appt details. Mailed out reminder letter and calender.

## 2019-06-08 ENCOUNTER — Encounter: Payer: Self-pay | Admitting: Cardiovascular Disease

## 2019-06-08 ENCOUNTER — Ambulatory Visit (INDEPENDENT_AMBULATORY_CARE_PROVIDER_SITE_OTHER): Payer: Medicare Other | Admitting: Cardiovascular Disease

## 2019-06-08 ENCOUNTER — Other Ambulatory Visit: Payer: Self-pay

## 2019-06-08 VITALS — BP 132/88 | HR 64 | Ht 63.0 in | Wt 240.1 lb

## 2019-06-08 DIAGNOSIS — I10 Essential (primary) hypertension: Secondary | ICD-10-CM | POA: Diagnosis not present

## 2019-06-08 DIAGNOSIS — E7800 Pure hypercholesterolemia, unspecified: Secondary | ICD-10-CM | POA: Diagnosis not present

## 2019-06-08 DIAGNOSIS — I2510 Atherosclerotic heart disease of native coronary artery without angina pectoris: Secondary | ICD-10-CM | POA: Diagnosis not present

## 2019-06-08 NOTE — Patient Instructions (Signed)
Medication Instructions:  No changes *If you need a refill on your cardiac medications before your next appointment, please call your pharmacy*   Lab Work: none  Testing/Procedures: none   Follow-Up: At [facility], you and your health needs are our priority.  As part of our continuing mission to provide you with exceptional heart care, we have created designated Provider Care Teams.  These Care Teams include your primary Cardiologist (physician) and Advanced Practice Providers (APPs -  Physician Assistants and Nurse Practitioners) who all work together to provide you with the care you need, when you need it.  Your next appointment:   12 month(s)  The format for your next appointment:   Either In Person or Virtual  Provider:   You may see Lauree Chandler, MD or one of the following Advanced Practice Providers on your designated Care Team:    Melina Copa, PA-C  Ermalinda Barrios, PA-C   Other Instructions

## 2019-06-08 NOTE — Progress Notes (Signed)
Chief Complaint  Patient presents with  . Follow-up    CAD   History of Present Illness: 79 yo female with history of CAD with inferior STEMI October 2015, HTN, DVT, prior CVA, prior GI bleeding and sleep apnea here today for cardiac follow up. She was admitted to [facility] October 2015 with an inferior STEMI. A bare metal stent was placed in the sub-totally occluded proximal RCA. The LAD and Circumflex had no disease. She has been on long term anti-coagulation with coumadin therapy for recurrent DVT. This has been changed to Eliquis. She was seen in our office June 2019 with chest pain. Nuclear stress test 09/27/17 showed low risk with normal LV function and no ischemia. There was a possible apical lateral scar. Admitted with GI bleeding July 2020 and was seen by GI and had an upper endoscopy revealing a gastric ulcer.    She is here today for follow up. The patient denies any chest pain, dyspnea, palpitations, lower extremity edema, orthopnea, PND, dizziness, near syncope or syncope. No recent GI bleeding.   Primary Care Physician: Merrilee Seashore, MD  Past Medical History:  Diagnosis Date  . Anemia   . BMI 40.0-44.9, adult ([place])   . CAD in native artery, stent to RCA BMS with MI 2015 11/04/2003   a. inferior STEMI 01/2014 s/p BMS to prox RCA.  [name] DVT (deep venous thrombosis) ([place]) 2003   lower extremity DVT with questionable recurrence in 2005. Treated with 1.5 year course of coumadin. Another admission 2015.  . Endometrial cancer ([place])   . Endometrial thickening on ultra sound 07/2009   path showing Fragments of benign endocervical and squamous mucosa. No dysplasia or malignancy // Followed by Dr. [name]  . First degree AV block   . Gastric ulcer   . GERD (gastroesophageal reflux disease)   . GI bleed   . Hemorrhoid   . Hyperlipidemia LDL goal <70   . Hypertension   . Insomnia 11/02/2010  . Memory difficulties 11/02/2010  . Mild dilation of ascending aorta (HCC)   . Morbid  obesity ([place]) 09/16/2012  . Obesity   . Obstructive sleep apnea 09/23/2010  . Prediabetes DX: 09/2010   HgA1c 6.1  . Pulmonary nodule   . ST elevation myocardial infarction (STEMI) of inferior wall ([place]) 01/24/2014  . Stroke ([place]) 2015  . Syncope 12/18/2013  . Syncope, near 12/14/2018  . TIA (transient ischemic attack) 09/23/2010   09/13/2010: CT, MRI/MRA no acute process. Carotid doppler: no stenosis. 2D-Echo Normal LV function with an EF 55-60%.   [name] UTI (urinary tract infection) 01/27/2014    Past Surgical History:  Procedure Laterality Date  . COLONOSCOPY W/ POLYPECTOMY  11/01/2007   8 mm rectal adenoma, hemorrhoids  . CORONARY ANGIOPLASTY WITH STENT PLACEMENT  01/24/14   Inf. STEMI, BMS to RCA  . DILATION AND CURETTAGE OF UTERUS N/A 03/28/2019   Procedure: DILATATION AND CURETTAGE OF UTERUS;  Surgeon: Lafonda Mosses, MD;  [facility];  Service: Gynecology;  Laterality: N/A;  . ESOPHAGOGASTRODUODENOSCOPY N/A 10/06/2018   Procedure: ESOPHAGOGASTRODUODENOSCOPY (EGD);  Surgeon: Doran Stabler, MD;  Location: [place];  Service: Gastroenterology;  Laterality: N/A;  . HOT HEMOSTASIS N/A 10/06/2018   Procedure: HOT HEMOSTASIS (ARGON PLASMA COAGULATION/BICAP);  Surgeon: Doran Stabler, MD;  Location: [place];  Service: Gastroenterology;  Laterality: N/A;  . INTRAUTERINE DEVICE (IUD) INSERTION N/A 03/28/2019   Procedure: INTRAUTERINE DEVICE (IUD) INSERTION;  Surgeon: Lafonda Mosses, MD;  Location: [facility];  Service: Gynecology;  Laterality: N/A;  . KNEE SURGERY  1996   Left knee arthroscopy.  [name] LEFT HEART CATHETERIZATION WITH CORONARY ANGIOGRAM N/A 01/24/2014   Procedure: LEFT HEART CATHETERIZATION WITH CORONARY ANGIOGRAM;  Surgeon: Burnell Blanks, MD;  Location: [facility] CATH LAB;  Service: Cardiovascular;  Laterality: N/A;  . ROTATOR CUFF REPAIR     right.  [name]  10/06/2018   Procedure: [name];  Surgeon: Doran Stabler, MD;  Location: [place];  Service: Gastroenterology;;  . TUBAL LIGATION      Current Outpatient Medications  Medication Sig Dispense Refill  . acetaminophen (TYLENOL) 500 MG tablet Take 500 mg by mouth every 6 (six) hours as needed for moderate pain.     [name] apixaban (ELIQUIS) 2.5 MG TABS tablet Take 1 tablet (2.5 mg total) by mouth 2 (two) times daily. 60 tablet 5  . atorvastatin (LIPITOR) 80 MG tablet Take 1 tablet (80 mg total) by mouth daily. 90 tablet 3  . Cholecalciferol (VITAMIN D3) 10 MCG (400 UNIT) CAPS Take 400 Units by mouth daily.    . ferrous sulfate 325 (65 FE) MG tablet Take 325 mg by mouth daily with breakfast.    . lisinopril (ZESTRIL) 40 MG tablet Take 40 mg by mouth daily.    . metoprolol succinate (TOPROL-XL) 25 MG 24 hr tablet Take 1 tablet (25 mg total) by mouth daily. 90 tablet 3  . Multiple Vitamin (MULTIVITAMIN WITH MINERALS) TABS Take 1 tablet by mouth every morning.    . nitroGLYCERIN (NITROSTAT) 0.4 MG SL tablet Place 1 tablet (0.4 mg total) under the tongue every 5 (five) minutes x 3 doses as needed for chest pain. 25 tablet 3  . pantoprazole (PROTONIX) 40 MG tablet Take 1 tablet (40 mg total) by mouth daily. 90 tablet 3  . predniSONE (DELTASONE) 50 MG tablet Three doses of 50 mg oral prednisone administered 13, 7, and 1 hour prior to your CT scan and then 50 mg of benadryl orally administered 1 hour prior to your CT scan. 3 tablet 0   No current facility-administered medications for this visit.    Allergies  Allergen Reactions  . Iohexol Rash    Pt. Stated a rash after CT scan     Social History   Socioeconomic History  . Marital status: Married    Spouse name: Not on file  . Number of children: 6  . Years of education: College  . Highest education level: Not on file  Occupational History  . Occupation: Control and instrumentation engineer  . Occupation: TEACHER- [employer]    Employer: [employer]  Tobacco Use  . Smoking status: Never Smoker  . Smokeless  tobacco: Never Used  Substance and Sexual Activity  . Alcohol use: No  . Drug use: No  . Sexual activity: Not on file  Other Topics Concern  . Not on file  Social History Narrative   Full Code status.   Insurance: BCBS   Social Determinants of Health   Financial Resource Strain:   . Difficulty of Paying Living Expenses: Not on file  Food Insecurity:   . Worried About Charity fundraiser in the Last Year: Not on file  . Ran Out of Food in the Last Year: Not on file  Transportation Needs:   . Lack of Transportation (Medical): Not on file  . Lack of Transportation (Non-Medical): Not on file  Physical Activity:   . Days of Exercise per Week: Not on file  .  Minutes of Exercise per Session: Not on file  Stress:   . Feeling of Stress : Not on file  Social Connections:   . Frequency of Communication with Friends and Family: Not on file  . Frequency of Social Gatherings with Friends and Family: Not on file  . Attends Religious Services: Not on file  . Active Member of Clubs or Organizations: Not on file  . Attends Archivist Meetings: Not on file  . Marital Status: Not on file  Intimate Partner Violence:   . Fear of Current or Ex-Partner: Not on file  . Emotionally Abused: Not on file  . Physically Abused: Not on file  . Sexually Abused: Not on file    Family History  Problem Relation Age of Onset  . Diabetes Mother   . Hyperlipidemia Mother   . Heart disease Mother   . Dementia Mother   . Angina Father   . Heart attack Father   . Thyroid cancer Sister   . Liver cancer Sister   . Heart disease Brother        x 2 in 47s yo  . Alzheimer's disease Maternal Grandmother   . Stroke Neg Hx   . Colon cancer Neg Hx   . Esophageal cancer Neg Hx   . Rectal cancer Neg Hx   . Stomach cancer Neg Hx   . Ovarian cancer Neg Hx   . Endometrial cancer Neg Hx     Review of Systems:  As stated in the HPI and otherwise negative.   BP 132/88   Pulse 64   Ht 5\' 3"  (1.6 m)    Wt 240 lb 1.9 oz (108.9 kg)   SpO2 98%   BMI 42.54 kg/m   Physical Examination:  General: Well developed, well nourished, NAD  HEENT: OP clear, mucus membranes moist  SKIN: warm, dry. No rashes. Neuro: No focal deficits  Musculoskeletal: Muscle strength 5/5 all ext  Psychiatric: Mood and affect normal  Neck: No JVD, no carotid bruits, no thyromegaly, no lymphadenopathy.  Lungs:Clear bilaterally, no wheezes, rhonci, crackles Cardiovascular: Regular rate and rhythm. No murmurs, gallops or rubs. Abdomen:Soft. Bowel sounds present. Non-tender.  Extremities: No lower extremity edema. Pulses are 2 + in the bilateral DP/PT.  Echo 01/24/14:  Left ventricle: The cavity size was normal. There was mild concentric hypertrophy. Systolic function was normal. The estimated ejection fraction was in the range of 55% to 60%. Wall motion was normal; there were no regional wall motion abnormalities. - Aortic valve: There was trivial regurgitation. - Ascending aorta: The ascending aorta was mildly dilated. - Mitral valve: Calcified annulus.  Cardiac cath 01/24/14: Left main: No obstructive disease.  Left Anterior Descending Artery: Large caliber vessel that courses to the apex. Moderate caliber diagonal branch. No obstructive disease.  Circumflex Artery: Large caliber vessel with large obtuse marginal branch. No obstructive disease.  Right Coronary Artery: Moderate caliber co-dominant vessel with sub-totally occluded proximal vessel.  Left Ventricular Angiogram: LVEF=50%  EKG:  EKG is not ordered today. The ekg ordered today demonstrates   Recent Labs: 12/13/2018: Magnesium 1.8 12/14/2018: TSH 0.940 03/26/2019: ALT 19; BUN 15; Creatinine, Ser 0.60; Hemoglobin 12.4; Platelets 218; Potassium 3.9; Sodium 139   Lipid Panel Primary care 05/27/17:  TC 102 HDL 39 LDL 45 TG: 87   Wt Readings from Last 3 Encounters:  06/08/19 240 lb 1.9 oz (108.9 kg)  03/28/19 240 lb (108.9 kg)    03/26/19 240 lb 12.8 oz (109.2 kg)  Other studies Reviewed: Additional studies/ records that were reviewed today include: . Review of the above records demonstrates:    Assessment and Plan:   1. CAD without angina: No chest pain suggestive of angina. Low risk stress test in June 2019. Will continue statin and beta blocker. She is off of ASA since she is on Eliquis.   2. HTN: BP is controlled. Continue current therapy    3. DVT: She is on lifelong anti-coagulation. Now on Eliquis. This is managed in primary care.   4. HLD: Lipids followed in primary care. Continue statin.    Current medicines are reviewed at length with the patient today.  The patient does not have concerns regarding medicines.  The following changes have been made:  no change  Labs/ tests ordered today include:   No orders of the defined types were placed in this encounter.   Disposition:   FU with me in 12 months  Signed, Lauree Chandler, MD 06/08/2019 10:56 AM    [facility] [place], [place], [place]  [address] Phone: [phone]; Fax: [phone]

## 2019-06-11 ENCOUNTER — Telehealth: Payer: Self-pay | Admitting: *Deleted

## 2019-06-11 NOTE — Telephone Encounter (Signed)
Pt takes Eliquis for recurrent DVT (2003, questionable recurrence in 2005, another in 2015-on lifelong anticoag), does also have hx of stroke in 2015 and TIA in 2012 as well. Renal function is normal. Ok to hold Eliquis for 24 hours prior to procedure - if > 24 hour hold is required, will need physician input.

## 2019-06-11 NOTE — Telephone Encounter (Signed)
   Primary Cardiologist: Lauree Chandler, MD  Chart reviewed as part of pre-operative protocol coverage. The patient was doing well on cardiac stand point when seen by Dr. [name] 06/08/19. Per note "DVT: She is on lifelong anti-coagulation. Now on Eliquis. This is managed in primary care". Please direct additional anticoagulation question to PCP.   Given past medical history and time since last visit, based on ACC/AHA guidelines, Shirley [name] would be at acceptable risk for the planned procedure without further cardiovascular testing.   I will route this recommendation to the requesting party via Epic fax function and remove from pre-op pool.  Please call with questions.  [place], [place] 06/11/2019, 3:49 PM

## 2019-06-11 NOTE — Telephone Encounter (Signed)
   [facility] Pre-operative Risk Assessment    Request for surgical clearance:  1. What type of surgery is being performed? ROBOTIC HYSTERECTOMY, BSO, SLN   2. When is this surgery scheduled? TBD SOMETIME IN June    3. What type of clearance is required (medical clearance vs. Pharmacy clearance to hold med vs. Both)? BOTH  4. Are there any medications that need to be held prior to surgery and how long? ELIQUIS   5. Practice name and name of physician performing surgery? [facility]; DR. Berline Lopes  6. What is your office phone number [phone]    7.   What is your office fax number [phone]  8.   Anesthesia type (None, local, MAC, general) ? GENERAL   Julaine Hua 06/11/2019, 2:16 PM  _________________________________________________________________   (provider comments below)

## 2019-06-25 NOTE — Progress Notes (Deleted)
Gynecologic Oncology Return Clinic Visit  ***  Reason for Visit: ***  Treatment History: Diagnosed with Gr1 endometrioid endometrial cancer. 12/23: D&C (FIGO gr 1-2), Mirena IUD placement 12/23: CT A/P negative for metastatic disease. Several small fibroids, diffuse thickening of endometrium. Last saw cardiology on 3/5. Per note on 3/8 from cardiology, patient at acceptable risk for planned procedure without further CV testing. In terms of Eliquis, ok to hold Eliquis for 24hrs prior to procedure, will need physician input if >24hrs.  Interval History: ***  Past Medical/Surgical History: Past Medical History:  Diagnosis Date  . Anemia   . BMI 40.0-44.9, adult ([place])   . CAD in native artery, stent to RCA BMS with MI 2015 11/04/2003   a. inferior STEMI 01/2014 s/p BMS to prox RCA.  [name] DVT (deep venous thrombosis) ([place]) 2003   lower extremity DVT with questionable recurrence in 2005. Treated with 1.5 year course of coumadin. Another admission 2015.  . Endometrial cancer ([place])   . Endometrial thickening on ultra sound 07/2009   path showing Fragments of benign endocervical and squamous mucosa. No dysplasia or malignancy // Followed by Dr. [name]  . First degree AV block   . Gastric ulcer   . GERD (gastroesophageal reflux disease)   . GI bleed   . Hemorrhoid   . Hyperlipidemia LDL goal <70   . Hypertension   . Insomnia 11/02/2010  . Memory difficulties 11/02/2010  . Mild dilation of ascending aorta (HCC)   . Morbid obesity ([place]) 09/16/2012  . Obesity   . Obstructive sleep apnea 09/23/2010  . Prediabetes DX: 09/2010   HgA1c 6.1  . Pulmonary nodule   . ST elevation myocardial infarction (STEMI) of inferior wall ([place]) 01/24/2014  . Stroke ([place]) 2015  . Syncope 12/18/2013  . Syncope, near 12/14/2018  . TIA (transient ischemic attack) 09/23/2010   09/13/2010: CT, MRI/MRA no acute process. Carotid doppler: no stenosis. 2D-Echo Normal LV function with an EF 55-60%.   [name] UTI (urinary  tract infection) 01/27/2014    Past Surgical History:  Procedure Laterality Date  . COLONOSCOPY W/ POLYPECTOMY  11/01/2007   8 mm rectal adenoma, hemorrhoids  . CORONARY ANGIOPLASTY WITH STENT PLACEMENT  01/24/14   Inf. STEMI, BMS to RCA  . DILATION AND CURETTAGE OF UTERUS N/A 03/28/2019   Procedure: DILATATION AND CURETTAGE OF UTERUS;  Surgeon: Lafonda Mosses, MD;  [facility];  Service: Gynecology;  Laterality: N/A;  . ESOPHAGOGASTRODUODENOSCOPY N/A 10/06/2018   Procedure: ESOPHAGOGASTRODUODENOSCOPY (EGD);  Surgeon: Doran Stabler, MD;  Location: [place];  Service: Gastroenterology;  Laterality: N/A;  . HOT HEMOSTASIS N/A 10/06/2018   Procedure: HOT HEMOSTASIS (ARGON PLASMA COAGULATION/BICAP);  Surgeon: Doran Stabler, MD;  Location: [place];  Service: Gastroenterology;  Laterality: N/A;  . INTRAUTERINE DEVICE (IUD) INSERTION N/A 03/28/2019   Procedure: INTRAUTERINE DEVICE (IUD) INSERTION;  Surgeon: Lafonda Mosses, MD;  [facility];  Service: Gynecology;  Laterality: N/A;  . KNEE SURGERY  1996   Left knee arthroscopy.  [name] LEFT HEART CATHETERIZATION WITH CORONARY ANGIOGRAM N/A 01/24/2014   Procedure: LEFT HEART CATHETERIZATION WITH CORONARY ANGIOGRAM;  Surgeon: Burnell Blanks, MD;  Location: [facility] CATH LAB;  Service: Cardiovascular;  Laterality: N/A;  . ROTATOR CUFF REPAIR     right.  [name]  10/06/2018   Procedure: [name];  Surgeon: Doran Stabler, MD;  Location: [place];  Service: Gastroenterology;;  . TUBAL LIGATION      Family History  Problem Relation Age of Onset  . Diabetes Mother   . Hyperlipidemia Mother   . Heart disease Mother   . Dementia Mother   . Angina Father   . Heart attack Father   . Thyroid cancer Sister   . Liver cancer Sister   . Heart disease Brother        x 2 in 73s yo  . Alzheimer's disease Maternal Grandmother   . Stroke Neg Hx   . Colon cancer Neg Hx   . Esophageal cancer Neg Hx   .  Rectal cancer Neg Hx   . Stomach cancer Neg Hx   . Ovarian cancer Neg Hx   . Endometrial cancer Neg Hx     Social History   Socioeconomic History  . Marital status: Married    Spouse name: Not on file  . Number of children: 6  . Years of education: College  . Highest education level: Not on file  Occupational History  . Occupation: Control and instrumentation engineer  . Occupation: TEACHER- [employer]    Employer: [employer]  Tobacco Use  . Smoking status: Never Smoker  . Smokeless tobacco: Never Used  Substance and Sexual Activity  . Alcohol use: No  . Drug use: No  . Sexual activity: Not on file  Other Topics Concern  . Not on file  Social History Narrative   Full Code status.   Insurance: BCBS   Social Determinants of Health   Financial Resource Strain:   . Difficulty of Paying Living Expenses:   Food Insecurity:   . Worried About Charity fundraiser in the Last Year:   . Arboriculturist in the Last Year:   Transportation Needs:   . Film/video editor (Medical):   [name] Lack of Transportation (Non-Medical):   Physical Activity:   . Days of Exercise per Week:   . Minutes of Exercise per Session:   Stress:   . Feeling of Stress :   Social Connections:   . Frequency of Communication with Friends and Family:   . Frequency of Social Gatherings with Friends and Family:   . Attends Religious Services:   . Active Member of Clubs or Organizations:   . Attends Archivist Meetings:   [name] Marital Status:     Current Medications:  Current Outpatient Medications:  .  acetaminophen (TYLENOL) 500 MG tablet, Take 500 mg by mouth every 6 (six) hours as needed for moderate pain. , Disp: , Rfl:  .  apixaban (ELIQUIS) 2.5 MG TABS tablet, Take 1 tablet (2.5 mg total) by mouth 2 (two) times daily., Disp: 60 tablet, Rfl: 5 .  atorvastatin (LIPITOR) 80 MG tablet, Take 1 tablet (80 mg total) by mouth daily., Disp: 90 tablet, Rfl: 3 .  Cholecalciferol (VITAMIN D3) 10 MCG  (400 UNIT) CAPS, Take 400 Units by mouth daily., Disp: , Rfl:  .  ferrous sulfate 325 (65 FE) MG tablet, Take 325 mg by mouth daily with breakfast., Disp: , Rfl:  .  lisinopril (ZESTRIL) 40 MG tablet, Take 40 mg by mouth daily., Disp: , Rfl:  .  metoprolol succinate (TOPROL-XL) 25 MG 24 hr tablet, Take 1 tablet (25 mg total) by mouth daily., Disp: 90 tablet, Rfl: 3 .  Multiple Vitamin (MULTIVITAMIN WITH MINERALS) TABS, Take 1 tablet by mouth every morning., Disp: , Rfl:  .  nitroGLYCERIN (NITROSTAT) 0.4 MG SL tablet, Place 1 tablet (0.4 mg total) under the tongue every 5 (five) minutes x 3  doses as needed for chest pain., Disp: 25 tablet, Rfl: 3 .  pantoprazole (PROTONIX) 40 MG tablet, Take 1 tablet (40 mg total) by mouth daily., Disp: 90 tablet, Rfl: 3 .  predniSONE (DELTASONE) 50 MG tablet, Three doses of 50 mg oral prednisone administered 13, 7, and 1 hour prior to your CT scan and then 50 mg of benadryl orally administered 1 hour prior to your CT scan., Disp: 3 tablet, Rfl: 0  Review of Systems: Denies appetite changes, fevers, chills, fatigue, unexplained weight changes. Denies hearing loss, neck lumps or masses, mouth sores, ringing in ears or voice changes. Denies cough or wheezing.  Denies shortness of breath. Denies chest pain or palpitations. Denies leg swelling. Denies abdominal distention, pain, blood in stools, constipation, diarrhea, nausea, vomiting, or early satiety. Denies pain with intercourse, dysuria, frequency, hematuria or incontinence. Denies hot flashes, pelvic pain, vaginal bleeding or vaginal discharge.   Denies joint pain, back pain or muscle pain/cramps. Denies itching, rash, or wounds. Denies dizziness, headaches, numbness or seizures. Denies swollen lymph nodes or glands, denies easy bruising or bleeding. Denies anxiety, depression, confusion, or decreased concentration.  Physical Exam: There were no vitals taken for this visit. General: ***Alert, oriented, no  acute distress. HEENT: ***Posterior oropharynx clear, sclera anicteric. Chest: ***Clear to auscultation bilaterally.  ***Port site clean. Cardiovascular: ***Regular rate and rhythm, no murmurs. Abdomen: ***Obese, soft, nontender.  Normoactive bowel sounds.  No masses or hepatosplenomegaly appreciated.  ***Well-healed scar. Extremities: ***Grossly normal range of motion.  Warm, well perfused.  No edema bilaterally. Skin: ***No rashes or lesions noted. Lymphatics: ***No cervical, supraclavicular, or inguinal adenopathy. GU: Normal appearing external genitalia without erythema, excoriation, or lesions.  Speculum exam reveals ***.  Bimanual exam reveals ***.  ***Rectovaginal exam  confirms ___.  Laboratory & Radiologic Studies: None new  Assessment & Plan: Shirley [name] is a 79 y.o. woman with Stage *** who presents for ***.  ***  *** minutes of total time was spent for this patient encounter, including preparation, face-to-face counseling with the patient and coordination of care, and documentation of the encounter.  Jeral Pinch, MD  Division of Gynecologic Oncology  Department of Obstetrics and Gynecology  [facility]

## 2019-06-28 ENCOUNTER — Telehealth: Payer: Self-pay | Admitting: *Deleted

## 2019-06-28 ENCOUNTER — Inpatient Hospital Stay: Payer: Medicare Other | Admitting: Gynecologic Oncology

## 2019-06-28 NOTE — Telephone Encounter (Signed)
Called and spoke with the patient regarding her missed appt from today. Patient thought appt was tomorrow, appt moved to 3/29

## 2019-07-02 ENCOUNTER — Inpatient Hospital Stay (HOSPITAL_BASED_OUTPATIENT_CLINIC_OR_DEPARTMENT_OTHER): Payer: Medicare Other | Admitting: Gynecologic Oncology

## 2019-07-02 ENCOUNTER — Other Ambulatory Visit: Payer: Self-pay

## 2019-07-02 ENCOUNTER — Encounter: Payer: Self-pay | Admitting: Gynecologic Oncology

## 2019-07-02 VITALS — BP 171/60 | HR 68 | Temp 97.8°F | Resp 20 | Ht 63.0 in | Wt 239.0 lb

## 2019-07-02 DIAGNOSIS — E785 Hyperlipidemia, unspecified: Secondary | ICD-10-CM | POA: Diagnosis not present

## 2019-07-02 DIAGNOSIS — C541 Malignant neoplasm of endometrium: Secondary | ICD-10-CM

## 2019-07-02 DIAGNOSIS — Z8673 Personal history of transient ischemic attack (TIA), and cerebral infarction without residual deficits: Secondary | ICD-10-CM | POA: Diagnosis not present

## 2019-07-02 DIAGNOSIS — Z86718 Personal history of other venous thrombosis and embolism: Secondary | ICD-10-CM | POA: Diagnosis not present

## 2019-07-02 DIAGNOSIS — Z975 Presence of (intrauterine) contraceptive device: Secondary | ICD-10-CM

## 2019-07-02 DIAGNOSIS — I252 Old myocardial infarction: Secondary | ICD-10-CM | POA: Diagnosis not present

## 2019-07-02 DIAGNOSIS — K219 Gastro-esophageal reflux disease without esophagitis: Secondary | ICD-10-CM | POA: Diagnosis not present

## 2019-07-02 DIAGNOSIS — I10 Essential (primary) hypertension: Secondary | ICD-10-CM | POA: Diagnosis not present

## 2019-07-02 NOTE — Progress Notes (Addendum)
Gynecologic Oncology Return Clinic Visit  3/29  Reason for Visit: Follow-up in the setting of known endometrial cancer with Mirena IUD in place  Treatment History: Oncology History  Endometrial cancer ([place])  02/26/2019 Initial Biopsy   Endometrial biopsy revealed grade 1 endometrioid adenocarcinoma   02/26/2019 Initial Diagnosis   Endometrial cancer ([place])   03/27/2019 Imaging   CT abdomen and pelvis: Diffuse endometrial thickening, consistent with known endometrial carcinoma.  No evidence of metastatic disease within the abdomen or pelvis.  Several small uterine fibroids largest measuring 4 cm.   03/28/2019 Surgery   D&C: Pathology shows endometrioid carcinoma, FIGO grade 1-2    Medical comorbidities: Patient's medical history is complex with a history of DVT, MI requiring stent placement and stroke in 2015 (on anticoagulation since that time), and TIA earlier in 2020. She was recently admitted in September with near syncope and hyponatremia from dehydration. She was seen again in the ED in October with similar symptoms, did not require ED. In July 2020, she was admitted with GI bleeding. She received 2u of pRBCs and on EGD was noted to have ozzing cratered gastric ulcer, treated with cautery and epinephrine. H. Pylori antibody resulted positive, she was ultimately was treated with antibiotics and remains on pantoprazole.  After this admission, her anticoagulation was transitioned from warfarin to Eliquis.  She has OSA although has not been using her CPAP as is missing some tubing.   She lives at home with her husband although has much support from her 6 children.  She walks with a cane mostly because her children have asked her to.  She is able to ambulate without a cane although also feels that she needs it for security given her bilateral knee pain.  She denies any shortness of breath or chest pain with walking.  She has not walked up or down stairs in some time.  Interval  History: The patient reports overall she is doing well.  She had bleeding until early January but has since had no vaginal bleeding or discharge.  She has occasional mild pelvic cramping but otherwise denies pelvic or abdominal pain.  She endorses some back pain.  She has lost approximately 8 pounds in the last 4-5 months.  She endorses a good appetite without any nausea, emesis, or early satiety.  She reports normal bowel and bladder function.  She is received both Covid vaccine doses.  Patient was seen by her cardiologist on 06/08/2019.  She is deemed "acceptable risk for planned procedure without further cardiovascular testing".  Notes indicate that it is okay to hold Eliquis for 24 hours prior to procedure but that physician input will be required if need to hold is more than 24 hours.  Past Medical/Surgical History: Past Medical History:  Diagnosis Date  . Anemia   . BMI 40.0-44.9, adult ([place])   . CAD in native artery, stent to RCA BMS with MI 2015 11/04/2003   a. inferior STEMI 01/2014 s/p BMS to prox RCA.  [name] DVT (deep venous thrombosis) ([place]) 2003   lower extremity DVT with questionable recurrence in 2005. Treated with 1.5 year course of coumadin. Another admission 2015.  . Endometrial cancer ([place])   . Endometrial thickening on ultra sound 07/2009   path showing Fragments of benign endocervical and squamous mucosa. No dysplasia or malignancy // Followed by Dr. [name]  . First degree AV block   . Gastric ulcer   . GERD (gastroesophageal reflux disease)   . GI bleed   .  Hemorrhoid   . Hyperlipidemia LDL goal <70   . Hypertension   . Insomnia 11/02/2010  . Memory difficulties 11/02/2010  . Mild dilation of ascending aorta ([facility])   . Morbid obesity ([place]) 09/16/2012  . Obesity   . Obstructive sleep apnea 09/23/2010  . Prediabetes DX: 09/2010   HgA1c 6.1  . Pulmonary nodule   . ST elevation myocardial infarction (STEMI) of inferior wall ([place]) 01/24/2014  . Stroke ([place]) 2015  .  Syncope 12/18/2013  . Syncope, near 12/14/2018  . TIA (transient ischemic attack) 09/23/2010   09/13/2010: CT, MRI/MRA no acute process. Carotid doppler: no stenosis. 2D-Echo Normal LV function with an EF 55-60%.   [name] UTI (urinary tract infection) 01/27/2014    Past Surgical History:  Procedure Laterality Date  . COLONOSCOPY W/ POLYPECTOMY  11/01/2007   8 mm rectal adenoma, hemorrhoids  . CORONARY ANGIOPLASTY WITH STENT PLACEMENT  01/24/14   Inf. STEMI, BMS to RCA  . DILATION AND CURETTAGE OF UTERUS N/A 03/28/2019   Procedure: DILATATION AND CURETTAGE OF UTERUS;  Surgeon: Lafonda Mosses, MD;  Location: [facility];  Service: Gynecology;  Laterality: N/A;  . ESOPHAGOGASTRODUODENOSCOPY N/A 10/06/2018   Procedure: ESOPHAGOGASTRODUODENOSCOPY (EGD);  Surgeon: Doran Stabler, MD;  Location: [facility];  Service: Gastroenterology;  Laterality: N/A;  . HOT HEMOSTASIS N/A 10/06/2018   Procedure: HOT HEMOSTASIS (ARGON PLASMA COAGULATION/BICAP);  Surgeon: Doran Stabler, MD;  Location: [facility];  Service: Gastroenterology;  Laterality: N/A;  . INTRAUTERINE DEVICE (IUD) INSERTION N/A 03/28/2019   Procedure: INTRAUTERINE DEVICE (IUD) INSERTION;  Surgeon: Lafonda Mosses, MD;  Location: [facility];  Service: Gynecology;  Laterality: N/A;  . KNEE SURGERY  1996   Left knee arthroscopy.  [name] LEFT HEART CATHETERIZATION WITH CORONARY ANGIOGRAM N/A 01/24/2014   Procedure: LEFT HEART CATHETERIZATION WITH CORONARY ANGIOGRAM;  Surgeon: Burnell Blanks, MD;  [facility] C[facility] LAB;  Service: Cardiovascular;  Laterality: N/A;  . ROTATOR CUFF REPAIR     right.  [name]  10/06/2018   Procedure: [name];  Surgeon: Doran Stabler, MD;  Location: [facility] ENDOSCOPY;  Service: Gastroenterology;;  . TUBAL LIGATION      Family History  Problem Relation Age of Onset  . Diabetes Mother   . Hyperlipidemia Mother   . Heart disease Mother   . Dementia Mother   . Angina Father   . Heart attack  Father   . Thyroid cancer Sister   . Liver cancer Sister   . Heart disease Brother        x 2 in 10s yo  . Alzheimer's disease Maternal Grandmother   . Stroke Neg Hx   . Colon cancer Neg Hx   . Esophageal cancer Neg Hx   . Rectal cancer Neg Hx   . Stomach cancer Neg Hx   . Ovarian cancer Neg Hx   . Endometrial cancer Neg Hx     Social History   Socioeconomic History  . Marital status: Married    Spouse name: Not on file  . Number of children: 6  . Years of education: College  . Highest education level: Not on file  Occupational History  . Occupation: Control and instrumentation engineer  . Occupation: TEACHER- [employer]    Employer: [employer]  Tobacco Use  . Smoking status: Never Smoker  . Smokeless tobacco: Never Used  Substance and Sexual Activity  . Alcohol use: No  . Drug use: No  . Sexual activity: Not on file  Other Topics Concern  . Not on file  Social History Narrative   Full Code status.   Insurance: BCBS   Social Determinants of Health   Financial Resource Strain:   . Difficulty of Paying Living Expenses:   Food Insecurity:   . Worried About Charity fundraiser in the Last Year:   . Arboriculturist in the Last Year:   Transportation Needs:   . Film/video editor (Medical):   [name] Lack of Transportation (Non-Medical):   Physical Activity:   . Days of Exercise per Week:   . Minutes of Exercise per Session:   Stress:   . Feeling of Stress :   Social Connections:   . Frequency of Communication with Friends and Family:   . Frequency of Social Gatherings with Friends and Family:   . Attends Religious Services:   . Active Member of Clubs or Organizations:   . Attends Archivist Meetings:   [name] Marital Status:     Current Medications:  Current Outpatient Medications:  .  acetaminophen (TYLENOL) 500 MG tablet, Take 500 mg by mouth every 6 (six) hours as needed for moderate pain. , Disp: , Rfl:  .  apixaban (ELIQUIS) 2.5 MG TABS tablet,  Take 1 tablet (2.5 mg total) by mouth 2 (two) times daily., Disp: 60 tablet, Rfl: 5 .  atorvastatin (LIPITOR) 80 MG tablet, Take 1 tablet (80 mg total) by mouth daily., Disp: 90 tablet, Rfl: 3 .  Cholecalciferol (VITAMIN D3) 10 MCG (400 UNIT) CAPS, Take 400 Units by mouth daily., Disp: , Rfl:  .  ferrous sulfate 325 (65 FE) MG tablet, Take 325 mg by mouth daily with breakfast., Disp: , Rfl:  .  lisinopril (ZESTRIL) 40 MG tablet, Take 40 mg by mouth daily., Disp: , Rfl:  .  metoprolol succinate (TOPROL-XL) 25 MG 24 hr tablet, Take 1 tablet (25 mg total) by mouth daily., Disp: 90 tablet, Rfl: 3 .  Multiple Vitamin (MULTIVITAMIN WITH MINERALS) TABS, Take 1 tablet by mouth every morning., Disp: , Rfl:  .  nitroGLYCERIN (NITROSTAT) 0.4 MG SL tablet, Place 1 tablet (0.4 mg total) under the tongue every 5 (five) minutes x 3 doses as needed for chest pain., Disp: 25 tablet, Rfl: 3 .  pantoprazole (PROTONIX) 40 MG tablet, Take 1 tablet (40 mg total) by mouth daily., Disp: 90 tablet, Rfl: 3 .  predniSONE (DELTASONE) 50 MG tablet, Three doses of 50 mg oral prednisone administered 13, 7, and 1 hour prior to your CT scan and then 50 mg of benadryl orally administered 1 hour prior to your CT scan., Disp: 3 tablet, Rfl: 0  Review of Systems: Pertinent positives as per HPI. Denies appetite changes, fevers, chills, fatigue, unexplained weight changes. Denies hearing loss, neck lumps or masses, mouth sores, ringing in ears or voice changes. Denies cough or wheezing.  Denies shortness of breath. Denies chest pain or palpitations. Denies leg swelling. Denies abdominal distention, pain, blood in stools, constipation, diarrhea, nausea, vomiting, or early satiety. Denies pain with intercourse, dysuria, frequency, hematuria or incontinence. Denies hot flashes, pelvic pain, vaginal bleeding or vaginal discharge.   Denies joint pain, or muscle pain/cramps. Denies itching, rash, or wounds. Denies dizziness, headaches,  numbness or seizures. Denies swollen lymph nodes or glands, denies easy bruising or bleeding. Denies anxiety, depression, confusion, or decreased concentration.  Physical Exam: BP (!) 171/60 (BP Location: Left Arm, Patient Position: Sitting) Comment: Use large cuff  Pulse 68   Temp 97.8 F (  36.6 C) (Temporal)   Resp 20   Ht 5\' 3"  (1.6 m)   Wt 239 lb (108.4 kg)   SpO2 100%   BMI 42.34 kg/m  General: Alert, oriented, no acute distress. HEENT: Normocephalic, atraumatic, sclera anicteric. Chest: Unlabored breathing on room air. Abdomen: Obese, soft, nontender.  Normoactive bowel sounds.  No masses or hepatosplenomegaly appreciated.  Well-healed scar. Extremities: Grossly normal range of motion.  Warm, well perfused.  1+ edema bilaterally. Skin: No rashes or lesions noted. Lymphatics: No cervical, supraclavicular, or inguinal adenopathy. GU: Normal appearing external genitalia without erythema, excoriation, or lesions.  Speculum exam reveals mildly atrophic vaginal mucosa, cervix posterior facing and somewhat difficult to see.  Multiple attempts made to visualize IUD strings but not visible.  Bimanual exam reveals mobile uterus, no nodularity.  Endometrial biopsy: With the patient's verbal consent, speculum is placed in the vagina and the cervix was visualized.  Betadine was used to clean the cervix x3.  Single-tooth tenaculum was used to grab the anterior lip of the cervix.  Endometrial Pipelle was placed to approximately 10 cm and 1 pass was performed with adequate tissue noted.  This was placed in formalin to be sent to pathology.  Overall the patient tolerated the procedure well.  Pressure and silver nitrate was used to achieve hemostasis of the tenaculum site.  Laboratory & Radiologic Studies: None new  Assessment & Plan: Shirley [name] is a 79 y.o. woman with clinical stage I, grade 1-2 M[place]ri endometrial adenocarcinoma currently being treated with progesterone therapy in the  form of an IUD who presents for surveillance and endometrial biopsy.  Endometrial biopsy performed today.  I cannot visualize or palpate the patient's IUD.  Given cessation of bleeding for approximately 2-1/2 months, I suspect that the IUD is in the uterus and that the strings are just not able to be visualized because they are within the canal or the cavity.  We will plan to get a pelvic ultrasound to assure intrauterine location of the IUD.  I will call the patient with endometrial biopsy results.  We discussed that it can take 4-6 months for average regression of endometrial cancer.  Given her weight loss and cardiac clearance, I would like to move forward with scheduling her surgery hopefully for the end of May or early June.  We will reach out to her cardiologist office since their note states that it will require physician clearance if she has to be off Eliquis for more than 24 hours preop.  We received a note from the patient's primary care physician on 04/26/2019 stating that she was medically cleared for surgery.  She has noted to have a slightly above average risk for complications related to her obesity.  Her current risk for any complications is estimated to be 11.4%.  This note references that she needs cardiac clearance, which we have obtained as noted above.  The patient has lost 7 pounds since her last visit with me.  She was congratulated on this achievement and voiced interest in continuing weight loss.  I offered to place a referral to our nutritionist.  20 minutes of total time was spent for this patient encounter, including preparation, face-to-face counseling with the patient and coordination of care, and documentation of the encounter.  Jeral Pinch, MD  Division of Gynecologic Oncology  Department of Obstetrics and Gynecology  [facility]

## 2019-07-02 NOTE — Addendum Note (Signed)
Addended by: Lafonda Mosses on: 07/02/2019 03:01 PM   Modules accepted: Orders

## 2019-07-02 NOTE — Patient Instructions (Signed)
I will call you with biopsy results as well as the results after your ultrasound.  I am tentatively scheduling you for surgery in mid May.  Any further weight that you can lose will help with your Surgery and to decrease your cancer risk moving forward.

## 2019-07-03 LAB — SURGICAL PATHOLOGY

## 2019-07-06 ENCOUNTER — Other Ambulatory Visit: Payer: Self-pay

## 2019-07-06 ENCOUNTER — Ambulatory Visit (HOSPITAL_COMMUNITY)
Admission: RE | Admit: 2019-07-06 | Discharge: 2019-07-06 | Disposition: A | Discharge status: Home / Self Care | Payer: Medicare Other | Source: Ambulatory Visit | Attending: Gynecologic Oncology | Admitting: Gynecologic Oncology

## 2019-07-06 DIAGNOSIS — C541 Malignant neoplasm of endometrium: Secondary | ICD-10-CM | POA: Insufficient documentation

## 2019-07-09 ENCOUNTER — Ambulatory Visit: Payer: Medicare Other | Admitting: Cardiovascular Disease

## 2019-07-09 ENCOUNTER — Encounter: Payer: Self-pay | Admitting: Nutrition

## 2019-07-09 ENCOUNTER — Inpatient Hospital Stay: Payer: Medicare Other | Attending: Nurse Practitioner | Admitting: Nutrition

## 2019-07-09 NOTE — Progress Notes (Signed)
Patient did not show up for nutrition appointment.  It appears patient's appointment was scheduled for assistance with patient desiring weight loss.  If patient desires nutrition counseling to achieve further weight loss patient should be referred to nutrition and diabetes education services department at [facility] health.

## 2019-07-11 ENCOUNTER — Telehealth: Payer: Self-pay | Admitting: *Deleted

## 2019-07-11 NOTE — Telephone Encounter (Addendum)
Fax clearance form and office note to patient's PCP for eliquis advice yesterday

## 2019-07-16 ENCOUNTER — Telehealth: Payer: Self-pay | Admitting: *Deleted

## 2019-07-16 NOTE — Telephone Encounter (Signed)
Received Eliquis clearance from the patient's PCP. Fax copy to pre admissions office, sent on to be scanned and one in patient chart

## 2019-08-06 ENCOUNTER — Ambulatory Visit: Payer: Medicare Other | Admitting: Gynecologic Oncology

## 2019-08-13 ENCOUNTER — Other Ambulatory Visit: Payer: Self-pay | Admitting: Gastroenterology

## 2019-08-17 NOTE — Progress Notes (Signed)
Gynecologic Oncology Return Clinic Visit  5/18  Reason for Visit: pre-operative visit  Treatment History: Oncology History  Endometrial cancer ([place])  02/26/2019 Initial Biopsy   Endometrial biopsy revealed grade 1 endometrioid adenocarcinoma   02/26/2019 Initial Diagnosis   Endometrial cancer ([place])   03/27/2019 Imaging   CT abdomen and pelvis: Diffuse endometrial thickening, consistent with known endometrial carcinoma.  No evidence of metastatic disease within the abdomen or pelvis.  Several small uterine fibroids largest measuring 4 cm.   03/28/2019 Surgery   D&C: Pathology shows endometrioid carcinoma, FIGO grade 1-2   07/02/2019 Pathology Results   EMB: focal Gr1 endometrioid adenocarcinoma Stromal changes c/w hormone effect     Interval History: The patient reports overall doing well.  She has had intermittent bleeding which she describes as a spotting or discharge on the tissue when she wipes after urinating.  She denies any abdominal pain or cramping.  She endorses a good appetite without nausea or emesis.  She reports normal bowel function.  Had both Covid vaccines, the second in February.  Past Medical/Surgical History: Past Medical History:  Diagnosis Date  . BMI 40.0-44.9, adult ([place])   . CAD in native artery, stent to RCA BMS with MI 2015 11/04/2003   a. inferior STEMI 01/2014 s/p BMS to prox RCA.  [name] DVT (deep venous thrombosis) ([place]) 2003   lower extremity DVT with questionable recurrence in 2005. Treated with 1.5 year course of coumadin. Another admission 2015.  . Endometrial cancer ([place])   . Endometrial thickening on ultra sound 07/2009   path showing Fragments of benign endocervical and squamous mucosa. No dysplasia or malignancy // Followed by Dr. [name]  . First degree AV block   . Gastric ulcer   . GERD (gastroesophageal reflux disease)   . GI bleed   . Hemorrhoid   . Hyperlipidemia LDL goal <70   . Hypertension   . Insomnia 11/02/2010  . Memory  difficulties 11/02/2010  . Mild dilation of ascending aorta ([facility])   . Morbid obesity ([place]) 09/16/2012  . Obesity   . Obstructive sleep apnea 09/23/2010  . Pre-diabetes   . Prediabetes DX: 09/2010   HgA1c 6.1  . Pulmonary nodule   . ST elevation myocardial infarction (STEMI) of inferior wall ([place]) 01/24/2014  . Stroke ([place]) 2015  . Syncope 12/18/2013  . Syncope, near 12/14/2018  . TIA (transient ischemic attack) 09/23/2010   09/13/2010: CT, MRI/MRA no acute process. Carotid doppler: no stenosis. 2D-Echo Normal LV function with an EF 55-60%.   [name] UTI (urinary tract infection) 01/27/2014    Past Surgical History:  Procedure Laterality Date  . COLONOSCOPY W/ POLYPECTOMY  11/01/2007   8 mm rectal adenoma, hemorrhoids  . CORONARY ANGIOPLASTY WITH STENT PLACEMENT  01/24/14   Inf. STEMI, BMS to RCA  . DILATION AND CURETTAGE OF UTERUS N/A 03/28/2019   Procedure: DILATATION AND CURETTAGE OF UTERUS;  Surgeon: Lafonda Mosses, MD;  Location: [facility];  Service: Gynecology;  Laterality: N/A;  . ESOPHAGOGASTRODUODENOSCOPY N/A 10/06/2018   Procedure: ESOPHAGOGASTRODUODENOSCOPY (EGD);  Surgeon: Doran Stabler, MD;  Location: [facility];  Service: Gastroenterology;  Laterality: N/A;  . HOT HEMOSTASIS N/A 10/06/2018   Procedure: HOT HEMOSTASIS (ARGON PLASMA COAGULATION/BICAP);  Surgeon: Doran Stabler, MD;  Location: [facility];  Service: Gastroenterology;  Laterality: N/A;  . INTRAUTERINE DEVICE (IUD) INSERTION N/A 03/28/2019   Procedure: INTRAUTERINE DEVICE (IUD) INSERTION;  Surgeon: Lafonda Mosses, MD;  Location: [facility];  Service: Gynecology;  Laterality:  N/A;  . KNEE SURGERY  1996   Left knee arthroscopy.  [name] LEFT HEART CATHETERIZATION WITH CORONARY ANGIOGRAM N/A 01/24/2014   Procedure: LEFT HEART CATHETERIZATION WITH CORONARY ANGIOGRAM;  Surgeon: Burnell Blanks, MD;  Location: [facility] CATH LAB;  Service: Cardiovascular;  Laterality: N/A;  . ROTATOR CUFF REPAIR     right.  [name]  10/06/2018   Procedure: [name];  Surgeon: Doran Stabler, MD;  Location: [facility] ENDOSCOPY;  Service: Gastroenterology;;  . TUBAL LIGATION      Family History  Problem Relation Age of Onset  . Diabetes Mother   . Hyperlipidemia Mother   . Heart disease Mother   . Dementia Mother   . Angina Father   . Heart attack Father   . Thyroid cancer Sister   . Liver cancer Sister   . Heart disease Brother        x 2 in 25s yo  . Alzheimer's disease Maternal Grandmother   . Stroke Neg Hx   . Colon cancer Neg Hx   . Esophageal cancer Neg Hx   . Rectal cancer Neg Hx   . Stomach cancer Neg Hx   . Ovarian cancer Neg Hx   . Endometrial cancer Neg Hx     Social History   Socioeconomic History  . Marital status: Married    Spouse name: Not on file  . Number of children: 6  . Years of education: College  . Highest education level: Not on file  Occupational History  . Occupation: Control and instrumentation engineer  . Occupation: TEACHER- [employer]    Employer: [employer]  Tobacco Use  . Smoking status: Never Smoker  . Smokeless tobacco: Never Used  Substance and Sexual Activity  . Alcohol use: No  . Drug use: No  . Sexual activity: Not on file  Other Topics Concern  . Not on file  Social History Narrative   Full Code status.   Insurance: BCBS   Social Determinants of Health   Financial Resource Strain:   . Difficulty of Paying Living Expenses:   Food Insecurity:   . Worried About Charity fundraiser in the Last Year:   . Arboriculturist in the Last Year:   Transportation Needs:   . Film/video editor (Medical):   [name] Lack of Transportation (Non-Medical):   Physical Activity:   . Days of Exercise per Week:   . Minutes of Exercise per Session:   Stress:   . Feeling of Stress :   Social Connections:   . Frequency of Communication with Friends and Family:   . Frequency of Social Gatherings with Friends and Family:   . Attends Religious Services:    . Active Member of Clubs or Organizations:   . Attends Archivist Meetings:   [name] Marital Status:     Current Medications:  Current Outpatient Medications:  .  apixaban (ELIQUIS) 2.5 MG TABS tablet, Take 1 tablet (2.5 mg total) by mouth 2 (two) times daily., Disp: 60 tablet, Rfl: 5 .  atorvastatin (LIPITOR) 80 MG tablet, Take 1 tablet (80 mg total) by mouth daily. (Patient taking differently: Take 80 mg by mouth daily at 6 PM. ), Disp: 90 tablet, Rfl: 3 .  cholecalciferol (VITAMIN D3) 25 MCG (1000 UNIT) tablet, Take 1,000 Units by mouth daily., Disp: , Rfl:  .  ferrous gluconate (IRON 27) 240 (27 FE) MG tablet, Take 240 mg by mouth daily with lunch., Disp: , Rfl:  .  ibuprofen (ADVIL) 200 MG tablet, Take 200 mg by mouth every 6 (six) hours as needed for headache or moderate pain., Disp: , Rfl:  .  lisinopril (ZESTRIL) 40 MG tablet, Take 40 mg by mouth daily., Disp: , Rfl:  .  metoprolol succinate (TOPROL-XL) 25 MG 24 hr tablet, Take 1 tablet (25 mg total) by mouth daily., Disp: 90 tablet, Rfl: 3 .  Multiple Vitamin (MULTIVITAMIN WITH MINERALS) TABS, Take 1 tablet by mouth every morning., Disp: , Rfl:  .  Naphazoline HCl (CLEAR EYES OP), Place 1 drop into both eyes daily as needed (dry eyes)., Disp: , Rfl:  .  nitroGLYCERIN (NITROSTAT) 0.4 MG SL tablet, Place 1 tablet (0.4 mg total) under the tongue every 5 (five) minutes x 3 doses as needed for chest pain., Disp: 25 tablet, Rfl: 3 .  pantoprazole (PROTONIX) 40 MG tablet, TAKE 1 TABLET BY MOUTH EVERY DAY, Disp: 90 tablet, Rfl: 0 .  sodium chloride (OCEAN) 0.65 % SOLN nasal spray, Place 1 spray into both nostrils as needed for congestion., Disp: , Rfl:  .  senna-docusate (SENOKOT-S) 8.6-50 MG tablet, Take 2 tablets by mouth at bedtime. For AFTER surgery, do not take if having diarrhea, Disp: 30 tablet, Rfl: 0 .  traMADol (ULTRAM) 50 MG tablet, Take 1 tablet (50 mg total) by mouth every 6 (six) hours as needed. For AFTER surgery, do not  take and drive, Disp: 10 tablet, Rfl: 0  Review of Systems: Denies appetite changes, fevers, chills, fatigue, unexplained weight changes. Denies hearing loss, neck lumps or masses, mouth sores, ringing in ears or voice changes. Denies cough or wheezing.  Denies shortness of breath. Denies chest pain or palpitations. Denies leg swelling. Denies abdominal distention, pain, blood in stools, constipation, diarrhea, nausea, vomiting, or early satiety. Denies pain with intercourse, dysuria, frequency, hematuria or incontinence. Denies hot flashes, pelvic pain, or vaginal discharge.   Denies joint pain, back pain or muscle pain/cramps. Denies itching, rash, or wounds. Denies dizziness, headaches, numbness or seizures. Denies swollen lymph nodes or glands, denies easy bruising or bleeding. Denies anxiety, depression, confusion, or decreased concentration.  Physical Exam: BP (!) 156/63 (BP Location: Right Arm, Patient Position: Sitting)   Pulse 70   Temp 98.7 F (37.1 C) (Temporal)   Resp 18   Ht 5\' 3"  (1.6 m)   Wt 236 lb (107 kg)   SpO2 100%   BMI 41.81 kg/m  General: Alert, oriented, no acute distress. HEENT: Normocephalic, atraumatic, sclera anicteric. Chest: Clear to auscultation bilaterally.  No wheezes or rhonchi. Cardiovascular: Regular rate and rhythm, no murmurs.  Laboratory & Radiologic Studies: None new  Assessment & Plan: Shirley [name] is a 79 y.o. woman with clinical stage 1 FIGO grade 1 endometrioid adenocarcinoma who presents for pre-operative visit.  We reviewed the nature of endometrial cancer and its recommended surgical staging, including total hysterectomy, bilateral salpingo-oophorectomy, and lymph node assessment. The patient is a suitable candidate for staging via a minimally invasive approach to surgery.  We reviewed that robotic assistance would be used to complete the surgery.   We discussed that most endometrial cancer is detected early and that  decisions regarding adjuvant therapy will be made based on her final pathology.   We reviewed the sentinel lymph node technique. Risks and benefits of sentinel lymph node biopsy was reviewed. We reviewed the technique and ICG dye. The patient DOES NOT have an iodine allergy or known liver dysfunction. We reviewed the false negative rate (0.4%), and that 3%  of patients with metastatic disease will not have it detected by SLN biopsy in endometrial cancer. A low risk of allergic reaction to the dye, <0.2% for ICG, has been reported. We also discussed that in the case of failed mapping, which occurs 40% of the time, a bilateral or unilateral lymphadenectomy will be performed at the surgeon's discretion.   Potential benefits of sentinel nodes including a higher detection rate for metastasis due to ultrastaging and potential reduction in operative morbidity. However, there remains uncertainty as to the role for treatment of micrometastatic disease. Further, the benefit of operative morbidity associated with the SLN technique in endometrial cancer is not yet completely known. In other patient populations (e.g. the cervical cancer population) there has been observed reductions in morbidity with SLN biopsy compared to pelvic lymphadenectomy. Lymphedema, nerve dysfunction and lymphocysts are all potential risks with the SLN technique as with complete lymphadenectomy. Additional risks to the patient include the risk of damage to an internal organ while operating in an altered view (e.g. the black and white image of the robotic fluorescence imaging mode).   The patient was consented for a robotic assisted hysterectomy, bilateral salpingo-oophorectomy, sentinel lymph node evaluation, possible lymph node dissection, possible laparotomy. The risks of surgery were discussed in detail and she understands these to include infection; wound separation; hernia; vaginal cuff separation, injury to adjacent organs such as bowel,  bladder, blood vessels, ureters and nerves; bleeding which may require blood transfusion; anesthesia risk; thromboembolic events; possible death; unforeseen complications; possible need for re-exploration; medical complications such as heart attack, stroke, pleural effusion and pneumonia; and, if full lymphadenectomy is performed the risk of lymphedema and lymphocyst. The patient will receive DVT and antibiotic prophylaxis as indicated. She voiced a clear understanding. She had the opportunity to ask questions. Perioperative instructions were reviewed with her. Prescriptions for post-op medications were sent to her pharmacy of choice.  I reached out to the patient's cardiologist, who deferred anticoagulation management to her PCP.  The patient has been called by her primary care provider and was instructed to hold her Eliquis for 2 days before and 2 days after surgery.  We will modify this based on surgery if any concern for postoperative bleeding.  25 minutes of total time was spent for this patient encounter, including preparation, face-to-face counseling with the patient and coordination of care, and documentation of the encounter.  Jeral Pinch, MD  Division of Gynecologic Oncology  Department of Obstetrics and Gynecology  [facility]

## 2019-08-17 NOTE — H&P (View-Only) (Signed)
Gynecologic Oncology Return Clinic Visit  5/18  Reason for Visit: pre-operative visit  Treatment History: Oncology History  Endometrial cancer ([place])  02/26/2019 Initial Biopsy   Endometrial biopsy revealed grade 1 endometrioid adenocarcinoma   02/26/2019 Initial Diagnosis   Endometrial cancer ([place])   03/27/2019 Imaging   CT abdomen and pelvis: Diffuse endometrial thickening, consistent with known endometrial carcinoma.  No evidence of metastatic disease within the abdomen or pelvis.  Several small uterine fibroids largest measuring 4 cm.   03/28/2019 Surgery   D&C: Pathology shows endometrioid carcinoma, FIGO grade 1-2   07/02/2019 Pathology Results   EMB: focal Gr1 endometrioid adenocarcinoma Stromal changes c/w hormone effect     Interval History: The patient reports overall doing well.  She has had intermittent bleeding which she describes as a spotting or discharge on the tissue when she wipes after urinating.  She denies any abdominal pain or cramping.  She endorses a good appetite without nausea or emesis.  She reports normal bowel function.  Had both Covid vaccines, the second in February.  Past Medical/Surgical History: Past Medical History:  Diagnosis Date  . BMI 40.0-44.9, adult ([place])   . CAD in native artery, stent to RCA BMS with MI 2015 11/04/2003   a. inferior STEMI 01/2014 s/p BMS to prox RCA.  [name] DVT (deep venous thrombosis) ([place]) 2003   lower extremity DVT with questionable recurrence in 2005. Treated with 1.5 year course of coumadin. Another admission 2015.  . Endometrial cancer ([place])   . Endometrial thickening on ultra sound 07/2009   path showing Fragments of benign endocervical and squamous mucosa. No dysplasia or malignancy // Followed by Dr. [name]  . First degree AV block   . Gastric ulcer   . GERD (gastroesophageal reflux disease)   . GI bleed   . Hemorrhoid   . Hyperlipidemia LDL goal <70   . Hypertension   . Insomnia 11/02/2010  . Memory  difficulties 11/02/2010  . Mild dilation of ascending aorta ([facility])   . Morbid obesity ([place]) 09/16/2012  . Obesity   . Obstructive sleep apnea 09/23/2010  . Pre-diabetes   . Prediabetes DX: 09/2010   HgA1c 6.1  . Pulmonary nodule   . ST elevation myocardial infarction (STEMI) of inferior wall ([place]) 01/24/2014  . Stroke ([place]) 2015  . Syncope 12/18/2013  . Syncope, near 12/14/2018  . TIA (transient ischemic attack) 09/23/2010   09/13/2010: CT, MRI/MRA no acute process. Carotid doppler: no stenosis. 2D-Echo Normal LV function with an EF 55-60%.   [name] UTI (urinary tract infection) 01/27/2014    Past Surgical History:  Procedure Laterality Date  . COLONOSCOPY W/ POLYPECTOMY  11/01/2007   8 mm rectal adenoma, hemorrhoids  . CORONARY ANGIOPLASTY WITH STENT PLACEMENT  01/24/14   Inf. STEMI, BMS to RCA  . DILATION AND CURETTAGE OF UTERUS N/A 03/28/2019   Procedure: DILATATION AND CURETTAGE OF UTERUS;  Surgeon: Lafonda Mosses, MD;  Location: [facility];  Service: Gynecology;  Laterality: N/A;  . ESOPHAGOGASTRODUODENOSCOPY N/A 10/06/2018   Procedure: ESOPHAGOGASTRODUODENOSCOPY (EGD);  Surgeon: Doran Stabler, MD;  Location: [facility];  Service: Gastroenterology;  Laterality: N/A;  . HOT HEMOSTASIS N/A 10/06/2018   Procedure: HOT HEMOSTASIS (ARGON PLASMA COAGULATION/BICAP);  Surgeon: Doran Stabler, MD;  Location: [facility];  Service: Gastroenterology;  Laterality: N/A;  . INTRAUTERINE DEVICE (IUD) INSERTION N/A 03/28/2019   Procedure: INTRAUTERINE DEVICE (IUD) INSERTION;  Surgeon: Lafonda Mosses, MD;  Location: [facility];  Service: Gynecology;  Laterality:  N/A;  . KNEE SURGERY  1996   Left knee arthroscopy.  [name] LEFT HEART CATHETERIZATION WITH CORONARY ANGIOGRAM N/A 01/24/2014   Procedure: LEFT HEART CATHETERIZATION WITH CORONARY ANGIOGRAM;  Surgeon: Burnell Blanks, MD;  Location: [facility] C[facility];  Service: Cardiovascular;  Laterality: N/A;  . ROTATOR CUFF REPAIR     right.  [name]  10/06/2018   Procedure: [name];  Surgeon: Doran Stabler, MD;  Location: [facility] ENDOSCOPY;  Service: Gastroenterology;;  . TUBAL LIGATION      Family History  Problem Relation Age of Onset  . Diabetes Mother   . Hyperlipidemia Mother   . Heart disease Mother   . Dementia Mother   . Angina Father   . Heart attack Father   . Thyroid cancer Sister   . Liver cancer Sister   . Heart disease Brother        x 2 in 55s yo  . Alzheimer's disease Maternal Grandmother   . Stroke Neg Hx   . Colon cancer Neg Hx   . Esophageal cancer Neg Hx   . Rectal cancer Neg Hx   . Stomach cancer Neg Hx   . Ovarian cancer Neg Hx   . Endometrial cancer Neg Hx     Social History   Socioeconomic History  . Marital status: Married    Spouse name: Not on file  . Number of children: 6  . Years of education: College  . Highest education level: Not on file  Occupational History  . Occupation: Control and instrumentation engineer  . Occupation: TEACHER- [employer]    Employer: [employer]  Tobacco Use  . Smoking status: Never Smoker  . Smokeless tobacco: Never Used  Substance and Sexual Activity  . Alcohol use: No  . Drug use: No  . Sexual activity: Not on file  Other Topics Concern  . Not on file  Social History Narrative   Full Code status.   Insurance: BCBS   Social Determinants of Health   Financial Resource Strain:   . Difficulty of Paying Living Expenses:   Food Insecurity:   . Worried About Charity fundraiser in the Last Year:   . Arboriculturist in the Last Year:   Transportation Needs:   . Film/video editor (Medical):   [name] Lack of Transportation (Non-Medical):   Physical Activity:   . Days of Exercise per Week:   . Minutes of Exercise per Session:   Stress:   . Feeling of Stress :   Social Connections:   . Frequency of Communication with Friends and Family:   . Frequency of Social Gatherings with Friends and Family:   . Attends Religious Services:    . Active Member of Clubs or Organizations:   . Attends Archivist Meetings:   [name] Marital Status:     Current Medications:  Current Outpatient Medications:  .  apixaban (ELIQUIS) 2.5 MG TABS tablet, Take 1 tablet (2.5 mg total) by mouth 2 (two) times daily., Disp: 60 tablet, Rfl: 5 .  atorvastatin (LIPITOR) 80 MG tablet, Take 1 tablet (80 mg total) by mouth daily. (Patient taking differently: Take 80 mg by mouth daily at 6 PM. ), Disp: 90 tablet, Rfl: 3 .  cholecalciferol (VITAMIN D3) 25 MCG (1000 UNIT) tablet, Take 1,000 Units by mouth daily., Disp: , Rfl:  .  ferrous gluconate (IRON 27) 240 (27 FE) MG tablet, Take 240 mg by mouth daily with lunch., Disp: , Rfl:  .  ibuprofen (ADVIL) 200 MG tablet, Take 200 mg by mouth every 6 (six) hours as needed for headache or moderate pain., Disp: , Rfl:  .  lisinopril (ZESTRIL) 40 MG tablet, Take 40 mg by mouth daily., Disp: , Rfl:  .  metoprolol succinate (TOPROL-XL) 25 MG 24 hr tablet, Take 1 tablet (25 mg total) by mouth daily., Disp: 90 tablet, Rfl: 3 .  Multiple Vitamin (MULTIVITAMIN WITH MINERALS) TABS, Take 1 tablet by mouth every morning., Disp: , Rfl:  .  Naphazoline HCl (CLEAR EYES OP), Place 1 drop into both eyes daily as needed (dry eyes)., Disp: , Rfl:  .  nitroGLYCERIN (NITROSTAT) 0.4 MG SL tablet, Place 1 tablet (0.4 mg total) under the tongue every 5 (five) minutes x 3 doses as needed for chest pain., Disp: 25 tablet, Rfl: 3 .  pantoprazole (PROTONIX) 40 MG tablet, TAKE 1 TABLET BY MOUTH EVERY DAY, Disp: 90 tablet, Rfl: 0 .  sodium chloride (OCEAN) 0.65 % SOLN nasal spray, Place 1 spray into both nostrils as needed for congestion., Disp: , Rfl:  .  senna-docusate (SENOKOT-S) 8.6-50 MG tablet, Take 2 tablets by mouth at bedtime. For AFTER surgery, do not take if having diarrhea, Disp: 30 tablet, Rfl: 0 .  traMADol (ULTRAM) 50 MG tablet, Take 1 tablet (50 mg total) by mouth every 6 (six) hours as needed. For AFTER surgery, do not  take and drive, Disp: 10 tablet, Rfl: 0  Review of Systems: Denies appetite changes, fevers, chills, fatigue, unexplained weight changes. Denies hearing loss, neck lumps or masses, mouth sores, ringing in ears or voice changes. Denies cough or wheezing.  Denies shortness of breath. Denies chest pain or palpitations. Denies leg swelling. Denies abdominal distention, pain, blood in stools, constipation, diarrhea, nausea, vomiting, or early satiety. Denies pain with intercourse, dysuria, frequency, hematuria or incontinence. Denies hot flashes, pelvic pain, or vaginal discharge.   Denies joint pain, back pain or muscle pain/cramps. Denies itching, rash, or wounds. Denies dizziness, headaches, numbness or seizures. Denies swollen lymph nodes or glands, denies easy bruising or bleeding. Denies anxiety, depression, confusion, or decreased concentration.  Physical Exam: BP (!) 156/63 (BP Location: Right Arm, Patient Position: Sitting)   Pulse 70   Temp 98.7 F (37.1 C) (Temporal)   Resp 18   Ht 5\' 3"  (1.6 m)   Wt 236 lb (107 kg)   SpO2 100%   BMI 41.81 kg/m  General: Alert, oriented, no acute distress. HEENT: Normocephalic, atraumatic, sclera anicteric. Chest: Clear to auscultation bilaterally.  No wheezes or rhonchi. Cardiovascular: Regular rate and rhythm, no murmurs.  Laboratory & Radiologic Studies: None new  Assessment & Plan: Shirley [name] is a 79 y.o. woman with clinical stage 1 FIGO grade 1 endometrioid adenocarcinoma who presents for pre-operative visit.  We reviewed the nature of endometrial cancer and its recommended surgical staging, including total hysterectomy, bilateral salpingo-oophorectomy, and lymph node assessment. The patient is a suitable candidate for staging via a minimally invasive approach to surgery.  We reviewed that robotic assistance would be used to complete the surgery.   We discussed that most endometrial cancer is detected early and that  decisions regarding adjuvant therapy will be made based on her final pathology.   We reviewed the sentinel lymph node technique. Risks and benefits of sentinel lymph node biopsy was reviewed. We reviewed the technique and ICG dye. The patient DOES NOT have an iodine allergy or known liver dysfunction. We reviewed the false negative rate (0.4%), and that 3%  of patients with metastatic disease will not have it detected by SLN biopsy in endometrial cancer. A low risk of allergic reaction to the dye, <0.2% for ICG, has been reported. We also discussed that in the case of failed mapping, which occurs 40% of the time, a bilateral or unilateral lymphadenectomy will be performed at the surgeon's discretion.   Potential benefits of sentinel nodes including a higher detection rate for metastasis due to ultrastaging and potential reduction in operative morbidity. However, there remains uncertainty as to the role for treatment of micrometastatic disease. Further, the benefit of operative morbidity associated with the SLN technique in endometrial cancer is not yet completely known. In other patient populations (e.g. the cervical cancer population) there has been observed reductions in morbidity with SLN biopsy compared to pelvic lymphadenectomy. Lymphedema, nerve dysfunction and lymphocysts are all potential risks with the SLN technique as with complete lymphadenectomy. Additional risks to the patient include the risk of damage to an internal organ while operating in an altered view (e.g. the black and white image of the robotic fluorescence imaging mode).   The patient was consented for a robotic assisted hysterectomy, bilateral salpingo-oophorectomy, sentinel lymph node evaluation, possible lymph node dissection, possible laparotomy. The risks of surgery were discussed in detail and she understands these to include infection; wound separation; hernia; vaginal cuff separation, injury to adjacent organs such as bowel,  bladder, blood vessels, ureters and nerves; bleeding which may require blood transfusion; anesthesia risk; thromboembolic events; possible death; unforeseen complications; possible need for re-exploration; medical complications such as heart attack, stroke, pleural effusion and pneumonia; and, if full lymphadenectomy is performed the risk of lymphedema and lymphocyst. The patient will receive DVT and antibiotic prophylaxis as indicated. She voiced a clear understanding. She had the opportunity to ask questions. Perioperative instructions were reviewed with her. Prescriptions for post-op medications were sent to her pharmacy of choice.  I reached out to the patient's cardiologist, who deferred anticoagulation management to her PCP.  The patient has been called by her primary care provider and was instructed to hold her Eliquis for 2 days before and 2 days after surgery.  We will modify this based on surgery if any concern for postoperative bleeding.  25 minutes of total time was spent for this patient encounter, including preparation, face-to-face counseling with the patient and coordination of care, and documentation of the encounter.  Jeral Pinch, MD  Division of Gynecologic Oncology  Department of Obstetrics and Gynecology  [facility]

## 2019-08-20 NOTE — Patient Instructions (Addendum)
DUE TO COVID-19 ONLY ONE VISITOR IS ALLOWED TO COME WITH YOU AND STAY IN THE WAITING ROOM ONLY DURING PRE OP AND PROCEDURE DAY OF SURGERY. THE 2 VISITORS  MAY VISIT WITH YOU AFTER SURGERY IN YOUR PRIVATE ROOM DURING VISITING HOURS ONLY!  YOU NEED TO HAVE A COVID 19 TEST ON__6/4_____ @__10 :10_____, THIS TEST MUST BE DONE BEFORE SURGERY, COME  [address], [place] , [address].  ([place]) ONCE YOUR COVID TEST IS COMPLETED, PLEASE BEGIN THE QUARANTINE INSTRUCTIONS AS OUTLINED IN YOUR HANDOUT.                [place]    Your procedure is scheduled on: 09/11/19   Report to [facility] Main  Entrance   Report to Short Stay at 5:30 AM     Call this number if you have problems the morning of surgery [place], NO CHEWING GUM [place].  The day before surgery have a :  Full Liquid Diet   Strained creamy soups Tea, Coffee- with cream or mild and sugar or honey  Juices- cranberry , grape and apple  Jello  Milkshakes  Pudding , custards  Popsicles  Water Plain ice cream f, frozen yogurt, sherbet, plain yogurt  Fruit ices and popsicles with no fruit pulp  Sugar, honey and syrups Clear broths  Boost, Ensure, Resource and other liquid supplements NO CARBONATED BEVERAGES   No food after midnight but you may have clear liquids until 4:30 AM the day of surgery.   Take these medicines the morning of surgery with A SIP OF WATER:  Metoprolol, Protonix.     Bring your mask and tubing to the hospital with you.                                 You may not have any metal on your body including hair pins and              piercings  Do not wear jewelry, make-up, lotions, powders or perfumes, deodorant             Do not wear nail polish on your fingernails.  Do not shave  48 hours prior to surgery.             Do not bring valuables to the hospital. [place].  Contacts, dentures or bridgework may not be worn into surgery.      Patients discharged the day of surgery will not be allowed to drive home.   IF YOU ARE HAVING SURGERY AND GOING HOME THE SAME DAY, YOU MUST HAVE AN ADULT TO DRIVE YOU HOME AND BE WITH YOU FOR 24 HOURS.   YOU MAY GO HOME BY TAXI OR UBER OR ORTHERWISE, BUT AN ADULT MUST ACCOMPANY YOU HOME AND STAY WITH YOU FOR 24 HOURS.  Name and phone number of your driver:  Special Instructions: N/A              Please read over the following fact sheets you were given: _____________________________________________________________________             [facility] - Preparing for Surgery Before surgery, you can play an important role.   Because skin  is not sterile, your skin needs to be as free of germs as possible.   You can reduce the number of germs on your skin by washing with CHG (chlorahexidine gluconate) soap before surgery.  CHG is an antiseptic cleaner which kills germs and bonds with the skin to continue killing germs even after washing. Please DO NOT use if you have an allergy to CHG or antibacterial soaps.   If your skin becomes reddened/irritated stop using the CHG and inform your nurse when you arrive at Short Stay. Do not shave (including legs and underarms) for at least 48 hours prior to the first CHG shower.    Please follow these instructions carefully:  1.  Shower with CHG Soap the night before surgery and the  morning of Surgery.  2.  If you choose to wash your hair, wash your hair first as usual with your  normal  shampoo.  3.  After you shampoo, rinse your hair and body thoroughly to remove the  shampoo.                                        4.  Use CHG as you would any other liquid soap.  You can apply chg directly  to the skin and wash                       Gently with a scrungie or clean washcloth.  5.  Apply the CHG Soap to your body ONLY FROM THE NECK DOWN.   Do not use on  face/ open                           Wound or open sores. Avoid contact with eyes, ears mouth and genitals (private parts).                       Wash face,  Genitals (private parts) with your normal soap.             6.  Wash thoroughly, paying special attention to the area where your surgery  will be performed.  7.  Thoroughly rinse your body with warm water from the neck down.  8.  DO NOT shower/wash with your normal soap after using and rinsing off  the CHG Soap.             9.  Pat yourself dry with a clean towel.            10.  Wear clean pajamas.            11.  Place clean sheets on your bed the night of your first shower and do not  sleep with pets. Day of Surgery : Do not apply any lotions/deodorants the morning of surgery.  Please wear clean clothes to the hospital/surgery center.  FAILURE TO FOLLOW THESE INSTRUCTIONS MAY RESULT IN THE CANCELLATION OF YOUR SURGERY PATIENT SIGNATURE_________________________________  NURSE SIGNATURE__________________________________  ________________________________________________________________________   Adam [place]ix  An incentive spirometer is a tool that can help keep your lungs clear and active. This tool measures how well you are filling your lungs with each breath. Taking long deep breaths may help reverse or decrease the chance of developing breathing (pulmonary) problems (especially infection) following:  A long period of time when you are unable to move or be  active. BEFORE THE PROCEDURE   If the spirometer includes an indicator to show your best effort, your nurse or respiratory therapist will set it to a desired goal.  If possible, sit up straight or lean slightly forward. Try not to slouch.  Hold the incentive spirometer in an upright position. INSTRUCTIONS FOR USE  1. Sit on the edge of your bed if possible, or sit up as far as you can in bed or on a chair. 2. Hold the incentive spirometer in an upright  position. 3. Breathe out normally. 4. Place the mouthpiece in your mouth and seal your lips tightly around it. 5. Breathe in slowly and as deeply as possible, raising the piston or the ball toward the top of the column. 6. Hold your breath for 3-5 seconds or for as long as possible. Allow the piston or ball to fall to the bottom of the column. 7. Remove the mouthpiece from your mouth and breathe out normally. 8. Rest for a few seconds and repeat Steps 1 through 7 at least 10 times every 1-2 hours when you are awake. Take your time and take a few normal breaths between deep breaths. 9. The spirometer may include an indicator to show your best effort. Use the indicator as a goal to work toward during each repetition. 10. After each set of 10 deep breaths, practice coughing to be sure your lungs are clear. If you have an incision (the cut made at the time of surgery), support your incision when coughing by placing a pillow or rolled up towels firmly against it. Once you are able to get out of bed, walk around indoors and cough well. You may stop using the incentive spirometer when instructed by your caregiver.  RISKS AND COMPLICATIONS  Take your time so you do not get dizzy or light-headed.  If you are in pain, you may need to take or ask for pain medication before doing incentive spirometry. It is harder to take a deep breath if you are having pain. AFTER USE  Rest and breathe slowly and easily.  It can be helpful to keep track of a log of your progress. Your caregiver can provide you with a simple table to help with this. If you are using the spirometer at home, follow these instructions: [place] IF:   You are having difficultly using the spirometer.  You have trouble using the spirometer as often as instructed.  Your pain medication is not giving enough relief while using the spirometer.  You develop fever of 100.5 F (38.1 C) or higher. SEEK IMMEDIATE MEDICAL CARE IF:    You cough up bloody sputum that had not been present before.  You develop fever of 102 F (38.9 C) or greater.  You develop worsening pain at or near the incision site. MAKE SURE YOU:   Understand these instructions.  Will watch your condition.  Will get help right away if you are not doing well or get worse. Document Released: 08/02/2006 Document Revised: 06/14/2011 Document Reviewed: 10/03/2006 [facility] Patient Information 2014 [place], [place].   WHAT IS A BLOOD TRANSFUSION? Blood Transfusion Information  A transfusion is the replacement of blood or some of its parts. Blood is made up of multiple cells which provide different functions.  Red blood cells carry oxygen and are used for blood loss replacement.  White blood cells fight against infection.  Platelets control bleeding.  Plasma helps clot blood.  Other blood products are available for specialized needs, such as  hemophilia or other clotting disorders. BEFORE THE TRANSFUSION  Who gives blood for transfusions?   Healthy volunteers who are fully evaluated to make sure their blood is safe. This is blood bank blood. Transfusion therapy is the safest it has ever been in the practice of medicine. Before blood is taken from a donor, a complete history is taken to make sure that person has no history of diseases nor engages in risky social behavior (examples are intravenous drug use or sexual activity with multiple partners). The donor's travel history is screened to minimize risk of transmitting infections, such as malaria. The donated blood is tested for signs of infectious diseases, such as HIV and hepatitis. The blood is then tested to be sure it is compatible with you in order to minimize the chance of a transfusion reaction. If you or a relative donates blood, this is often done in anticipation of surgery and is not appropriate for emergency situations. It takes many days to process the donated blood. RISKS AND  COMPLICATIONS Although transfusion therapy is very safe and saves many lives, the main dangers of transfusion include:   Getting an infectious disease.  Developing a transfusion reaction. This is an allergic reaction to something in the blood you were given. Every precaution is taken to prevent this. The decision to have a blood transfusion has been considered carefully by your caregiver before blood is given. Blood is not given unless the benefits outweigh the risks. AFTER THE TRANSFUSION  Right after receiving a blood transfusion, you will usually feel much better and more energetic. This is especially true if your red blood cells have gotten low (anemic). The transfusion raises the level of the red blood cells which carry oxygen, and this usually causes an energy increase.  The nurse administering the transfusion will monitor you carefully for complications. HOME CARE INSTRUCTIONS  No special instructions are needed after a transfusion. You may find your energy is better. Speak with your caregiver about any limitations on activity for underlying diseases you may have. SEEK MEDICAL CARE IF:   Your condition is not improving after your transfusion.  You develop redness or irritation at the intravenous (IV) site. SEEK IMMEDIATE MEDICAL CARE IF:  Any of the following symptoms occur over the next 12 hours:  Shaking chills.  You have a temperature by mouth above 102 F (38.9 C), not controlled by medicine.  Chest, back, or muscle pain.  People around you feel you are not acting correctly or are confused.  Shortness of breath or difficulty breathing.  Dizziness and fainting.  You get a rash or develop hives.  You have a decrease in urine output.  Your urine turns a dark color or changes to pink, red, or brown. Any of the following symptoms occur over the next 10 days:  You have a temperature by mouth above 102 F (38.9 C), not controlled by medicine.  Shortness of  breath.  Weakness after normal activity.  The white part of the eye turns yellow (jaundice).  You have a decrease in the amount of urine or are urinating less often.  Your urine turns a dark color or changes to pink, red, or brown. Document Released: 03/19/2000 Document Revised: 06/14/2011 Document Reviewed: 11/06/2007 [facility] Patient Information 2014 [place], [place].  _______________________________________________________________________ ________________________________________________________________________

## 2019-08-21 ENCOUNTER — Other Ambulatory Visit: Payer: Self-pay | Admitting: Gynecologic Oncology

## 2019-08-21 ENCOUNTER — Encounter (HOSPITAL_COMMUNITY): Payer: Self-pay

## 2019-08-21 ENCOUNTER — Other Ambulatory Visit: Payer: Self-pay

## 2019-08-21 ENCOUNTER — Inpatient Hospital Stay: Payer: Medicare Other | Attending: Nurse Practitioner | Admitting: Gynecologic Oncology

## 2019-08-21 ENCOUNTER — Encounter (HOSPITAL_COMMUNITY): Payer: Medicare Other

## 2019-08-21 ENCOUNTER — Encounter (HOSPITAL_COMMUNITY)
Admission: RE | Admit: 2019-08-21 | Discharge: 2019-08-21 | Disposition: A | Discharge status: Home / Self Care | Payer: Medicare Other | Source: Ambulatory Visit | Attending: Gynecologic Oncology | Admitting: Gynecologic Oncology

## 2019-08-21 ENCOUNTER — Telehealth: Payer: Self-pay | Admitting: *Deleted

## 2019-08-21 ENCOUNTER — Encounter: Payer: Self-pay | Admitting: Gynecologic Oncology

## 2019-08-21 VITALS — BP 156/63 | HR 70 | Temp 98.7°F | Resp 18 | Ht 63.0 in | Wt 236.0 lb

## 2019-08-21 DIAGNOSIS — C541 Malignant neoplasm of endometrium: Secondary | ICD-10-CM | POA: Insufficient documentation

## 2019-08-21 DIAGNOSIS — Z01812 Encounter for preprocedural laboratory examination: Secondary | ICD-10-CM | POA: Diagnosis not present

## 2019-08-21 HISTORY — DX: Prediabetes: R73.03

## 2019-08-21 MED ORDER — TRAMADOL HCL 50 MG PO TABS: 50.0000 mg | ORAL_TABLET | Freq: Four times a day (QID) | ORAL | 0 refills | Status: DC | PRN

## 2019-08-21 MED ORDER — SENNOSIDES-DOCUSATE SODIUM 8.6-50 MG PO TABS: 2.0000 | ORAL_TABLET | Freq: Every day | ORAL | 0 refills | Status: DC

## 2019-08-21 NOTE — Telephone Encounter (Signed)
Called the daughter and left her a message to call the office back

## 2019-08-21 NOTE — Progress Notes (Signed)
Labs to be drawn at cancer center appt

## 2019-08-21 NOTE — Patient Instructions (Addendum)
Preparing for your Surgery  Plan for surgery on September 11, 2019 with Dr. Jeral Pinch at [place] will be scheduled for a robotic assisted total laparoscopic hysterectomy, bilateral salpingo-oophorectomy, sentinel lymph node biopsy, possible lymph node dissection, possible laparotomy.   Pre-operative Testing -You will receive a phone call from presurgical testing at [facility] to arrange for a pre-operative appointment over the phone, lab appointment, and COVID test. The COVID test normally happens 3 days prior to the surgery and they ask that you self quarantine after the test up until surgery to decrease chance of exposure.  -Bring your insurance card, copy of an advanced directive if applicable, medication list  -At that visit, you will be asked to sign a consent for a possible blood transfusion in case a transfusion becomes necessary during surgery.  The need for a blood transfusion is rare but having consent is a necessary part of your care.     -YOUR LAST DOSE OF ELIQUIS SHOULD BE ON June 5 IN THE EVENING.  DO NOT RESUME UNTIL CLEARED BY DR Berline Lopes.  -Do not take supplements such as fish oil (omega 3), red yeast rice, turmeric before your surgery.   Day Before Surgery at [place] will be asked to take in a light diet the day before surgery.  Avoid carbonated beverages.  You will be advised you can have clear liquids after midnight and up until 3 hours before your surgery.    Eat a light diet the day before surgery.  Examples including soups, broths, toast, yogurt, mashed potatoes.  AVOID ITEMS THAT CAUSE GAS. Things to avoid include carbonated beverages (fizzy beverages), raw fruits and raw vegetables, or beans.   If your bowels are filled with gas, your surgeon will have difficulty visualizing your pelvic organs which increases your surgical risks.  Your role in recovery Your role is to become active as soon as directed by your doctor, while still giving  yourself time to heal.  Rest when you feel tired. You will be asked to do the following in order to speed your recovery:  - Cough and breathe deeply. This helps to clear and expand your lungs and can prevent pneumonia after surgery.  - [place]. Do mild physical activity. Walking or moving your legs help your circulation and body functions return to normal. Do not try to get up or walk alone the first time after surgery.   -If you develop swelling on one leg or the other, pain in the back of your leg, redness/warmth in one of your legs, please call the office or go to the Emergency Room to have a doppler to rule out a blood clot. For shortness of breath, chest pain-seek care in the Emergency Room as soon as possible. - Actively manage your pain. Managing your pain lets you move in comfort. We will ask you to rate your pain on a scale of zero to 10. It is your responsibility to tell your doctor or nurse where and how much you hurt so your pain can be treated.  Special Considerations -If you are diabetic, you may be placed on insulin after surgery to have closer control over your blood sugars to promote healing and recovery.  This does not mean that you will be discharged on insulin.  If applicable, your oral antidiabetics will be resumed when you are tolerating a solid diet.  -Your final pathology results from surgery should be available around one week after  surgery and the results will be relayed to you when available.  -Dr. Lahoma Crocker is the surgeon that assists your GYN Oncologist with surgery.  If you end up staying the night, the next day after your surgery you will either see Dr. Denman George, Dr. Berline Lopes, or Dr. Lahoma Crocker.  -FMLA forms can be faxed to [phone] and please allow 5-7 business days for completion.  Pain Management After Surgery -You have been prescribed your pain medication and bowel regimen medications before surgery so that you can have these  available when you are discharged from the hospital. The pain medication is for use ONLY AFTER surgery and a new prescription will not be given.   -Make sure that you have Tylenol at home to use on a regular basis after surgery for pain control.   -Review the attached handout on narcotic use and their risks and side effects.   Bowel Regimen -You have been prescribed Sennakot-S to take nightly to prevent constipation especially if you are taking the narcotic pain medication intermittently.  It is important to prevent constipation and drink adequate amounts of liquids. You can stop taking this medication when you are not taking pain medication and you are back on your normal bowel routine.  Risks of Surgery Risks of surgery are low but include bleeding, infection, damage to surrounding structures, re-operation, blood clots, and very rarely death.   Blood Transfusion Information (For the consent to be signed before surgery)  We will be checking your blood type before surgery so in case of emergencies, we will know what type of blood you would need.                                            WHAT IS A BLOOD TRANSFUSION?  A transfusion is the replacement of blood or some of its parts. Blood is made up of multiple cells which provide different functions.  Red blood cells carry oxygen and are used for blood loss replacement.  White blood cells fight against infection.  Platelets control bleeding.  Plasma helps clot blood.  Other blood products are available for specialized needs, such as hemophilia or other clotting disorders. BEFORE THE TRANSFUSION  Who gives blood for transfusions?   You may be able to donate blood to be used at a later date on yourself (autologous donation).  Relatives can be asked to donate blood. This is generally not any safer than if you have received blood from a stranger. The same precautions are taken to ensure safety when a relative's blood is  donated.  Healthy volunteers who are fully evaluated to make sure their blood is safe. This is blood bank blood. Transfusion therapy is the safest it has ever been in the practice of medicine. Before blood is taken from a donor, a complete history is taken to make sure that person has no history of diseases nor engages in risky social behavior (examples are intravenous drug use or sexual activity with multiple partners). The donor's travel history is screened to minimize risk of transmitting infections, such as malaria. The donated blood is tested for signs of infectious diseases, such as HIV and hepatitis. The blood is then tested to be sure it is compatible with you in order to minimize the chance of a transfusion reaction. If you or a relative donates blood, this is often done in anticipation of surgery and  is not appropriate for emergency situations. It takes many days to process the donated blood. RISKS AND COMPLICATIONS Although transfusion therapy is very safe and saves many lives, the main dangers of transfusion include:   Getting an infectious disease.  Developing a transfusion reaction. This is an allergic reaction to something in the blood you were given. Every precaution is taken to prevent this. The decision to have a blood transfusion has been considered carefully by your caregiver before blood is given. Blood is not given unless the benefits outweigh the risks.  AFTER SURGERY INSTRUCTIONS  Return to work: 4-6 weeks if applicable  Activity: 1. Be up and out of the bed during the day.  Take a nap if needed.  You may walk up steps but be careful and use the hand rail.  Stair climbing will tire you more than you think, you may need to stop part way and rest.   2. No lifting or straining for 6 weeks over 10 pounds. No pushing, pulling, straining for 6 weeks.  3. No driving for 1 week(s) if you were cleared to drive before surgery.  Do not drive if you are taking narcotic pain medicine  and make sure that your reaction time has returned.   4. You can shower as soon as the next day after surgery. Shower daily.  Use soap and water on your incision and pat dry; don't rub.  No tub baths or submerging your body in water until cleared by your surgeon. If you have the soap that was given to you by pre-surgical testing that was used before surgery, you do not need to use it afterwards because this can irritate your incisions.   5. No sexual activity and nothing in the vagina for 8 weeks.  6. You may experience a small amount of clear drainage from your incisions, which is normal.  If the drainage persists, increases, or changes color please call the office.  7. Do not use creams, lotions, or ointments such as neosporin on your incisions after surgery until advised by your surgeon because they can cause removal of the dermabond glue on your incisions.    8. You may experience vaginal spotting after surgery or around the 6-8 week mark from surgery when the stitches at the top of the vagina begin to dissolve.  The spotting is normal but if you experience heavy bleeding, call our office.  9. Take Tylenol first for pain and only use narcotic pain medication for severe pain not relieved by the Tylenol.  Monitor your Tylenol intake to a max of 4,000 mg in a 24 hour period.  Diet: 1. Low sodium Heart Healthy Diet is recommended.  2. It is safe to use a laxative, such as Miralax or Colace, if you have difficulty moving your bowels. You can take Sennakot at bedtime every evening to keep bowel movements regular and to prevent constipation.    Wound Care: 1. Keep clean and dry.  Shower daily.  Reasons to call the Doctor:  Fever - Oral temperature greater than 100.4 degrees Fahrenheit  Foul-smelling vaginal discharge  Difficulty urinating  Nausea and vomiting  Increased pain at the site of the incision that is unrelieved with pain medicine.  Difficulty breathing with or without chest  pain  New calf pain especially if only on one side  Sudden, continuing increased vaginal bleeding with or without clots.   Contacts: For questions or concerns you should contact:  Dr. Jeral Pinch at [place],  NP at [phone]  After Hours: call [phone] and have the GYN Oncologist paged/contacted

## 2019-08-21 NOTE — Progress Notes (Signed)
PCP - Dr. Roselle Locus Cardiologist - Dr. Charlette Caffey  Chest x-ray - 01/20/19 EKG - 01/22/19 Stress Test - 09/27/2017 ECHO - 2015 Cardiac Cath - 2015  Sleep Study - yes CPAP - yes but hasn't used it this last year  Fasting Blood Sugar - NA Checks Blood Sugar _____ times a day  Blood Thinner Instructions:Eliquis Aspirin Instructions:Dr. Ashby Dawes said to hold for 2 days prior to DOS Last Dose:09/08/19  Anesthesia review:   Patient denies shortness of breath, fever, cough and chest pain at PAT appointment yes  Patient verbalized understanding of instructions that were given to them at the PAT appointment. Patient was also instructed that they will need to review over the PAT instructions again at home before surgery. yes

## 2019-08-24 ENCOUNTER — Telehealth: Payer: Self-pay | Admitting: *Deleted

## 2019-08-24 NOTE — Telephone Encounter (Signed)
Patient's daughter called regarding the 6/2 phone visit. Explained that the appt on 6/2 was for labs and was moved to the [facility]. Explained there was no 6/2 phone visit, the phone visit was on 5/18

## 2019-08-30 NOTE — Progress Notes (Signed)
[place]   [phone] [phone]   Clinic Follow up Note   Patient Care Team: Merrilee Seashore, MD as PCP - General (Internal Medicine) Burnell Blanks, MD as PCP - Cardiology (Cardiology)  Date of Service:  09/05/2019  CHIEF COMPLAINT: F/u of Recurrent DVT, GI bleed on anticoagulation  SUMMARY OF ONCOLOGIC HISTORY: Oncology History  Endometrial cancer ([place])  02/26/2019 Initial Biopsy   Endometrial biopsy revealed grade 1 endometrioid adenocarcinoma   02/26/2019 Initial Diagnosis   Endometrial cancer ([place])   03/27/2019 Imaging   CT abdomen and pelvis: Diffuse endometrial thickening, consistent with known endometrial carcinoma.  No evidence of metastatic disease within the abdomen or pelvis.  Several small uterine fibroids largest measuring 4 cm.   03/28/2019 Surgery   D&C: Pathology shows endometrioid carcinoma, FIGO grade 1-2   07/02/2019 Pathology Results   EMB: focal Gr1 endometrioid adenocarcinoma Stromal changes c/w hormone effect      History of Illness:  1. Left LE DVT after surgery in 2012, treated with short course of coumadin  2. In June of 2014 she complained of pain in her right leg. There had been no specific provoking event leading to this symptom. Evaluation showed a DVT again in the left leg. She was started on Xarelto.  3. She presents to the ED ON 12/18/2013 after an episode of near syncope at home. Collapsed to ground but no LOC. No injury with this. Had some upper left-sided headache but this is like her usual headaches. Has h/o DVT x2 in the past, on chronic xarelto, legs were swollen. Bilateral lower extremity venous duplex completed. Bilateral lower extremities are positivefor deep vein thrombosis involving bilateral posterior tibial and left peroneal veins. There is no evidence of Baker's cyst bilaterally. CTA without pulmonary embolism. Placed on IV heparin 12.5 ml/hr and Warfarin 7.5 mg/daily (dosing per  pharmacy), Xarelto discontinued. She was placed on Coumadin.  4. Aspirin 81 mg was added in 2015 after MI. 5. She was evaluated in the ED on 10/05/2018 for weakness, epigastric discomfort, brown emesis, and near syncope. She was found to be anemic with Hgb 9.6 and heme positive stool.Hemoglobin dropped to 7.9 and she was transfused 2 units packed redcellsandFFP. She underwent upper endoscopy by Dr. Loletha Carrow on 10/06/2018 which showed a large amount of fresh and clottedblood in the gastric fundus and in the gastric body as well as an oozing gastric ulcer which was cauterized. Aspirin and Coumadin were held and she was treated with PPI.  6. Repeat endoscopy on 11/06/18 showed resolution of the gastric ulcer. Hpylori was eradicated.She remained off anticoagulation until 8/11 when she was started on prophylactic dose of Eliquis 2.5 mg BID per Dr. Ashby Dawes. She did not resume aspirin    CURRENT THERAPY:  Eliquis 2.5mg  BID daily (she has been taking 2.5mg  daily for a while)   INTERVAL HISTORY:  Shirley [name] is here for a follow up of DVT management. She presents to the clinic alone. She notes she is doing well. She plans to have total hysterectomy and BSO for her endometrial cancer on 09/11/19 with Dr. Berline Lopes. She notes she was told by Dr Berline Lopes to stop Eliquis 3 days before surgery and restart 3 days afterward. She was told to be active as possible. She notes in the past year her only bleeding was vaginal bleeding. She notes her BP is elevated. She denies chest pain, or headaches. She notes since her EGD in 10/2018 she has been having aggressive burps intermittently.  REVIEW OF SYSTEMS:   Constitutional: Denies fevers, chills or abnormal weight loss Eyes: Denies blurriness of vision Ears, nose, mouth, throat, and face: Denies mucositis or sore throat (+) Burping  Respiratory: Denies cough, dyspnea or wheezes Cardiovascular: Denies palpitation, chest discomfort or lower extremity  swelling Gastrointestinal:  Denies nausea, heartburn or change in bowel habits Skin: Denies abnormal skin rashes Lymphatics: Denies new lymphadenopathy or easy bruising Neurological:Denies numbness, tingling or new weaknesses Behavioral/Psych: Mood is stable, no new changes  All other systems were reviewed with the patient and are negative.  MEDICAL HISTORY:  Past Medical History:  Diagnosis Date  . BMI 40.0-44.9, adult ([place])   . CAD in native artery, stent to RCA BMS with MI 2015 11/04/2003   a. inferior STEMI 01/2014 s/p BMS to prox RCA.  [name] DVT (deep venous thrombosis) ([place]) 2003   lower extremity DVT with questionable recurrence in 2005. Treated with 1.5 year course of coumadin. Another admission 2015.  . Endometrial cancer ([place])   . Endometrial thickening on ultra sound 07/2009   path showing Fragments of benign endocervical and squamous mucosa. No dysplasia or malignancy // Followed by Dr. [name]  . First degree AV block   . Gastric ulcer   . GERD (gastroesophageal reflux disease)   . GI bleed   . Hemorrhoid   . Hyperlipidemia LDL goal <70   . Hypertension   . Insomnia 11/02/2010  . Memory difficulties 11/02/2010  . Mild dilation of ascending aorta (HCC)   . Morbid obesity ([place]) 09/16/2012  . Obesity   . Obstructive sleep apnea 09/23/2010  . Pre-diabetes   . Prediabetes DX: 09/2010   HgA1c 6.1  . Pulmonary nodule   . ST elevation myocardial infarction (STEMI) of inferior wall ([place]) 01/24/2014  . Stroke ([place]) 2015  . Syncope 12/18/2013  . Syncope, near 12/14/2018  . TIA (transient ischemic attack) 09/23/2010   09/13/2010: CT, MRI/MRA no acute process. Carotid doppler: no stenosis. 2D-Echo Normal LV function with an EF 55-60%.   [name] UTI (urinary tract infection) 01/27/2014    SURGICAL HISTORY: Past Surgical History:  Procedure Laterality Date  . COLONOSCOPY W/ POLYPECTOMY  11/01/2007   8 mm rectal adenoma, hemorrhoids  . CORONARY ANGIOPLASTY WITH STENT PLACEMENT   01/24/14   Inf. STEMI, BMS to RCA  . DILATION AND CURETTAGE OF UTERUS N/A 03/28/2019   Procedure: DILATATION AND CURETTAGE OF UTERUS;  Surgeon: Lafonda Mosses, MD;  [facility];  Service: Gynecology;  Laterality: N/A;  . ESOPHAGOGASTRODUODENOSCOPY N/A 10/06/2018   Procedure: ESOPHAGOGASTRODUODENOSCOPY (EGD);  Surgeon: Doran Stabler, MD;  Location: [facility];  Service: Gastroenterology;  Laterality: N/A;  . HOT HEMOSTASIS N/A 10/06/2018   Procedure: HOT HEMOSTASIS (ARGON PLASMA COAGULATION/BICAP);  Surgeon: Doran Stabler, MD;  Location: [facility];  Service: Gastroenterology;  Laterality: N/A;  . INTRAUTERINE DEVICE (IUD) INSERTION N/A 03/28/2019   Procedure: INTRAUTERINE DEVICE (IUD) INSERTION;  Surgeon: Lafonda Mosses, MD;  [facility];  Service: Gynecology;  Laterality: N/A;  . KNEE SURGERY  1996   Left knee arthroscopy.  [name] LEFT HEART CATHETERIZATION WITH CORONARY ANGIOGRAM N/A 01/24/2014   Procedure: LEFT HEART CATHETERIZATION WITH CORONARY ANGIOGRAM;  Surgeon: Burnell Blanks, MD;  Location: [facility] CATH LAB;  Service: Cardiovascular;  Laterality: N/A;  . ROTATOR CUFF REPAIR     right.  [name]  10/06/2018   Procedure: [name];  Surgeon: Doran Stabler, MD;  Location: [place];  Service: Gastroenterology;;  . April Holding  LIGATION      I have reviewed the social history and family history with the patient and they are unchanged from previous note.  ALLERGIES:  is allergic to iohexol.  MEDICATIONS:  Current Outpatient Medications  Medication Sig Dispense Refill  . apixaban (ELIQUIS) 2.5 MG TABS tablet Take 1 tablet (2.5 mg total) by mouth 2 (two) times daily. 60 tablet 5  . atorvastatin (LIPITOR) 80 MG tablet Take 1 tablet (80 mg total) by mouth daily. (Patient taking differently: Take 80 mg by mouth daily at 6 PM. ) 90 tablet 3  . cholecalciferol (VITAMIN D3) 25 MCG (1000 UNIT) tablet Take 1,000 Units by mouth daily.    . ferrous  gluconate (IRON 27) 240 (27 FE) MG tablet Take 240 mg by mouth daily with lunch.    . ibuprofen (ADVIL) 200 MG tablet Take 200 mg by mouth every 6 (six) hours as needed for headache or moderate pain.    [name] lisinopril (ZESTRIL) 40 MG tablet Take 40 mg by mouth daily.    . metoprolol succinate (TOPROL-XL) 25 MG 24 hr tablet Take 1 tablet (25 mg total) by mouth daily. 90 tablet 3  . Multiple Vitamin (MULTIVITAMIN WITH MINERALS) TABS Take 1 tablet by mouth every morning.    . Naphazoline HCl (CLEAR EYES OP) Place 1 drop into both eyes daily as needed (dry eyes).    . nitroGLYCERIN (NITROSTAT) 0.4 MG SL tablet Place 1 tablet (0.4 mg total) under the tongue every 5 (five) minutes x 3 doses as needed for chest pain. 25 tablet 3  . pantoprazole (PROTONIX) 40 MG tablet TAKE 1 TABLET BY MOUTH EVERY DAY 90 tablet 0  . senna-docusate (SENOKOT-S) 8.6-50 MG tablet Take 2 tablets by mouth at bedtime. For AFTER surgery, do not take if having diarrhea 30 tablet 0  . sodium chloride (OCEAN) 0.65 % SOLN nasal spray Place 1 spray into both nostrils as needed for congestion.    . traMADol (ULTRAM) 50 MG tablet Take 1 tablet (50 mg total) by mouth every 6 (six) hours as needed. For AFTER surgery, do not take and drive 10 tablet 0   No current facility-administered medications for this visit.    PHYSICAL EXAMINATION: ECOG PERFORMANCE STATUS: 1 - Symptomatic but completely ambulatory  Vitals:   09/05/19 0831  BP: (!) 186/73  Pulse: (!) 59  Resp: 18  Temp: (!) 97.5 F (36.4 C)  SpO2: 100%   Filed Weights   09/05/19 0831  Weight: 233 lb 6.4 oz (105.9 kg)    Due to COVID19 we will limit examination to appearance. Patient had no complaints.  GENERAL:alert, no distress and comfortable SKIN: skin color normal, no rashes or significant lesions EYES: normal, Conjunctiva are pink and non-injected, sclera clear  NEURO: alert & oriented x 3 with fluent speech  (+) LE edema   LABORATORY DATA:  I have reviewed the  data as listed CBC Latest Ref Rng & Units 09/05/2019 03/26/2019 01/20/2019  WBC 4.0 - 10.5 K/uL 8.9 8.1 8.3  Hemoglobin 12.0 - 15.0 g/dL 13.9 12.4 11.9(L)  Hematocrit 36.0 - 46.0 % 43.0 40.4 35.7(L)  Platelets 150 - 400 K/uL 215 218 242     CMP Latest Ref Rng & Units 09/05/2019 03/26/2019 03/19/2019  Glucose 70 - 99 mg/dL 102(H) 100(H) 101(H)  BUN 8 - 23 mg/dL 10 15 12   Creatinine 0.44 - 1.00 mg/dL 0.72 0.60 0.70  Sodium 135 - 145 mmol/L 141 139 140  Potassium 3.5 - 5.1 mmol/L 4.2  3.9 4.3  Chloride 98 - 111 mmol/L 107 106 105  CO2 22 - 32 mmol/L 25 26 27   Calcium 8.9 - 10.3 mg/dL 9.1 8.8(L) 8.7(L)  Total Protein 6.5 - 8.1 g/dL 6.5 6.3(L) -  Total Bilirubin 0.3 - 1.2 mg/dL 0.6 0.8 -  Alkaline Phos 38 - 126 U/L 124 110 -  AST 15 - 41 U/L 17 20 -  ALT 0 - 44 U/L 20 19 -      RADIOGRAPHIC STUDIES: I have personally reviewed the radiological images as listed and agreed with the findings in the report. No results found.   ASSESSMENT & PLAN:  Shirley [name] is a 79 y.o. female with    1. RecurrentDVT, Chronic LE edema (L>R) -She has had recurrent DVT while on anticoagulation, previously on Xarelto, and had history of large upper GI bleed while on Coumadin and aspirin 81 mg. Her bleeding ulcer resolved and she recently started prophylactic dose of Eliquis 2.5 mg BID per PCP. She is tolerating well so far.  -From a hematology standpoint she is doing well. Labs reviewed, CBC and CMP WNL. The only bleeding she has had is her mild vaginal bleeding related to her endometrial cancer.  -Will continue Eliquis to 2.5mg  BID indefinitely. She will hold 3 days before and restart 3 days after her upcoming hysterectomy surgery this month.  -F/u in 3 months    2. HTNCAD, history of recent MI and stroke, HTN  -Continue medications.  -Her BP elevated at 186/73 today. She thinks it is related to stress from recent cancer diagnosis. I recommend she monitor her BP at home daily.  -A1c still  pending today (09/05/19)   3. Obesity, sedentary lifestyle, knee pain   4. Endometrial Carcinoma, Early stage, Grade 1-2, Dx 03/28/19 -She was having vaginal bleeding and required repeat Pelvic Exams and Pap smears.  -She underwent D&C on 03/28/19 to have Mirena IUD placed for progesterone therapy. Path showed Endometrioid carcinoma, FIGO Grade 1-2. She repeated biopsy on 07/02/19 which showed the same.  -She has endometrial thickening on her 03/27/19 CT AP.  -She is being seen by Gyn Dr. Jeral Pinch  -She plans to have total hysterectomy with BSO on 09/11/19. I discussed some endometrial cancers may require chemo, she will f/u with me after surgery and I may refer her to Dr. Alvy Bimler if treatment is needed.  -She plans to stop Eliquis 3 days before her surgery and restart 3 days afterward.     PLAN:  -continue Eliquis to 2.5mg  BID (will hold around upcoming surgery) -Total Hysterectomy and BSO scheduled on 09/11/19. Will review her path to see if she needs adjuvant chemo. If she does, I will refer her to my partner Dr. Alvy Bimler.   -Copy note to Dr Berline Lopes.  -Lab and F/u in 3 months, or sooner if needed.    No problem-specific Assessment & Plan notes found for this encounter.   Orders Placed This Encounter  Procedures  . ABO/Rh   All questions were answered. The patient knows to call the clinic with any problems, questions or concerns. No barriers to learning was detected. The total time spent in the appointment was 30 minutes.     Truitt Merle, MD 09/05/2019   I, Joslyn Devon, am acting as scribe for Truitt Merle, MD.   I have reviewed the above documentation for accuracy and completeness, and I agree with the above.

## 2019-09-04 DIAGNOSIS — Z86718 Personal history of other venous thrombosis and embolism: Secondary | ICD-10-CM | POA: Insufficient documentation

## 2019-09-05 ENCOUNTER — Other Ambulatory Visit: Payer: Self-pay

## 2019-09-05 ENCOUNTER — Inpatient Hospital Stay: Payer: Medicare Other | Attending: Nurse Practitioner | Admitting: Hematology

## 2019-09-05 ENCOUNTER — Encounter: Payer: Self-pay | Admitting: Hematology

## 2019-09-05 ENCOUNTER — Other Ambulatory Visit: Payer: Self-pay | Admitting: *Deleted

## 2019-09-05 ENCOUNTER — Other Ambulatory Visit (HOSPITAL_COMMUNITY): Payer: Medicare Other

## 2019-09-05 ENCOUNTER — Inpatient Hospital Stay: Payer: Medicare Other

## 2019-09-05 DIAGNOSIS — R609 Edema, unspecified: Secondary | ICD-10-CM | POA: Insufficient documentation

## 2019-09-05 DIAGNOSIS — I119 Hypertensive heart disease without heart failure: Secondary | ICD-10-CM | POA: Insufficient documentation

## 2019-09-05 DIAGNOSIS — I252 Old myocardial infarction: Secondary | ICD-10-CM | POA: Insufficient documentation

## 2019-09-05 DIAGNOSIS — Z90722 Acquired absence of ovaries, bilateral: Secondary | ICD-10-CM | POA: Insufficient documentation

## 2019-09-05 DIAGNOSIS — Z86718 Personal history of other venous thrombosis and embolism: Secondary | ICD-10-CM | POA: Diagnosis not present

## 2019-09-05 DIAGNOSIS — I2510 Atherosclerotic heart disease of native coronary artery without angina pectoris: Secondary | ICD-10-CM | POA: Insufficient documentation

## 2019-09-05 DIAGNOSIS — Z7982 Long term (current) use of aspirin: Secondary | ICD-10-CM | POA: Diagnosis not present

## 2019-09-05 DIAGNOSIS — C541 Malignant neoplasm of endometrium: Secondary | ICD-10-CM | POA: Diagnosis not present

## 2019-09-05 DIAGNOSIS — I82409 Acute embolism and thrombosis of unspecified deep veins of unspecified lower extremity: Secondary | ICD-10-CM | POA: Insufficient documentation

## 2019-09-05 DIAGNOSIS — Z7901 Long term (current) use of anticoagulants: Secondary | ICD-10-CM | POA: Insufficient documentation

## 2019-09-05 DIAGNOSIS — Z90710 Acquired absence of both cervix and uterus: Secondary | ICD-10-CM | POA: Insufficient documentation

## 2019-09-05 DIAGNOSIS — Z79899 Other long term (current) drug therapy: Secondary | ICD-10-CM | POA: Diagnosis not present

## 2019-09-05 LAB — COMPREHENSIVE METABOLIC PANEL
ALT: 20 U/L (ref 0–44)
AST: 17 U/L (ref 15–41)
Albumin: 3.3 g/dL — ABNORMAL LOW (ref 3.5–5.0)
Alkaline Phosphatase: 124 U/L (ref 38–126)
Anion gap: 9 (ref 5–15)
BUN: 10 mg/dL (ref 8–23)
CO2: 25 mmol/L (ref 22–32)
Calcium: 9.1 mg/dL (ref 8.9–10.3)
Chloride: 107 mmol/L (ref 98–111)
Creatinine, Ser: 0.72 mg/dL (ref 0.44–1.00)
GFR calc Af Amer: >60 mL/min (ref >60)
GFR calc non Af Amer: >60 mL/min (ref >60)
Glucose, Bld: 102 mg/dL — ABNORMAL HIGH (ref 70–99)
Potassium: 4.2 mmol/L (ref 3.5–5.1)
Sodium: 141 mmol/L (ref 135–145)
Total Bilirubin: 0.6 mg/dL (ref 0.3–1.2)
Total Protein: 6.5 g/dL (ref 6.5–8.1)

## 2019-09-05 LAB — CBC WITH DIFFERENTIAL (CANCER CENTER ONLY)
Abs Immature Granulocytes: 0.02 10*3/uL (ref 0.00–0.07)
Basophils Absolute: 0.1 10*3/uL (ref 0.0–0.1)
Basophils Relative: 1 %
Eosinophils Absolute: 0.3 10*3/uL (ref 0.0–0.5)
Eosinophils Relative: 4 %
HCT: 43 % (ref 36.0–46.0)
Hemoglobin: 13.9 g/dL (ref 12.0–15.0)
Immature Granulocytes: 0 %
Lymphocytes Relative: 44 %
Lymphs Abs: 3.9 10*3/uL (ref 0.7–4.0)
MCH: 28.1 pg (ref 26.0–34.0)
MCHC: 32.3 g/dL (ref 30.0–36.0)
MCV: 86.9 fL (ref 80.0–100.0)
Monocytes Absolute: 0.7 10*3/uL (ref 0.1–1.0)
Monocytes Relative: 7 %
Neutro Abs: 3.9 10*3/uL (ref 1.7–7.7)
Neutrophils Relative %: 44 %
Platelet Count: 215 10*3/uL (ref 150–400)
RBC: 4.95 MIL/uL (ref 3.87–5.11)
RDW: 14.4 % (ref 11.5–15.5)
WBC Count: 8.9 10*3/uL (ref 4.0–10.5)
nRBC: 0 % (ref 0.0–0.2)

## 2019-09-05 LAB — URINALYSIS, COMPLETE (UACMP) WITH MICROSCOPIC
Bilirubin Urine: NEGATIVE
Glucose, UA: NEGATIVE mg/dL
Ketones, ur: NEGATIVE mg/dL
Nitrite: NEGATIVE
Protein, ur: NEGATIVE mg/dL
Specific Gravity, Urine: 1.011 (ref 1.005–1.030)
pH: 7 (ref 5.0–8.0)

## 2019-09-05 LAB — TYPE AND SCREEN
ABO/RH(D): A POS
Antibody Screen: NEGATIVE

## 2019-09-05 LAB — ABO/RH: ABO/RH(D): A POS

## 2019-09-05 LAB — HEMOGLOBIN A1C
Hgb A1c MFr Bld: 6.2 % — ABNORMAL HIGH (ref 4.8–5.6)
Mean Plasma Glucose: 131.24 mg/dL

## 2019-09-06 ENCOUNTER — Telehealth: Payer: Self-pay | Admitting: Hematology

## 2019-09-06 NOTE — Telephone Encounter (Signed)
Scheduled appt per 6/2 los.  Spoke with pt and she is aware of the appt date and time.

## 2019-09-07 ENCOUNTER — Telehealth: Payer: Self-pay

## 2019-09-07 ENCOUNTER — Ambulatory Visit: Payer: Medicare Other | Admitting: Gynecologic Oncology

## 2019-09-07 ENCOUNTER — Other Ambulatory Visit (HOSPITAL_COMMUNITY)
Admission: RE | Admit: 2019-09-07 | Discharge: 2019-09-07 | Disposition: A | Discharge status: Home / Self Care | Payer: Medicare Other | Source: Ambulatory Visit | Attending: Gynecologic Oncology | Admitting: Gynecologic Oncology

## 2019-09-07 DIAGNOSIS — N390 Urinary tract infection, site not specified: Secondary | ICD-10-CM

## 2019-09-07 DIAGNOSIS — Z01812 Encounter for preprocedural laboratory examination: Secondary | ICD-10-CM | POA: Insufficient documentation

## 2019-09-07 DIAGNOSIS — Z20822 Contact with and (suspected) exposure to covid-19: Secondary | ICD-10-CM | POA: Insufficient documentation

## 2019-09-07 LAB — URINE CULTURE: Culture: >=100000 — AB

## 2019-09-07 MED ORDER — NITROFURANTOIN MONOHYD MACRO 100 MG PO CAPS: 100.0000 mg | ORAL_CAPSULE | Freq: Two times a day (BID) | ORAL | 0 refills | Status: DC

## 2019-09-07 NOTE — Telephone Encounter (Signed)
LM for Ms Shirley [name]  That her pre -op urine culture showed that she has a UTI. Macrobid 100 mg bid x 5 days was sent into [facility] on [address]. She needs to p/u medication today and start ATB. Requested that she call the office back to confirm receipt of this message.

## 2019-09-07 NOTE — Telephone Encounter (Signed)
Gave daughter Caren Griffins the information regarding the UTI and treatment as noted below.  She will pick up ATB for her mother.

## 2019-09-07 NOTE — Progress Notes (Signed)
Anesthesia Chart Review   Case: [id number] Date/Time: 09/11/19 0700   Procedures:      XI ROBOTIC ASSISTED TOTAL HYSTERECTOMY WITH BILATERAL SALPINGO OOPHORECTOMY, POSSIBLE LYMPH NODE DISSECTION, POSSIBLE LAPAROTOMY (N/A )     SENTINEL NODE BIOPSY (N/A )   Anesthesia type: General   Pre-op diagnosis: ENDOMETRIAL CANCER   [facility] / [facility]   Surgeons: Lafonda Mosses, MD      DISCUSSION:79 y.o. never smoker with h/o HTN, GERD, TIA, pre-diabetes, HLD, CAD, OSA, DVT (on Eliquis), endometrial cancer scheduled for above procedure 09/11/2019 with Dr. Jeral Pinch.   Per cardiology pre-operative risk assessment 06/11/2019, "Chart reviewed as part of pre-operative protocol coverage. The patient was doing well on cardiac stand point when seen by Dr. [name] 06/08/19. Per note "DVT: She is on lifelonganti-coagulation. Now on Eliquis.This is managed in primary care". Please direct additional anticoagulation question to PCP.  Given past medical history and time since last visit, based on ACC/AHA guidelines, Shirley [name] would be at acceptable risk for the planned procedure without further cardiovascular testing."  Advised by PCP to hold Eliquis 2 days prior to procedure.    Anticipate pt can proceed with planned procedure barring acute status change.   VS: There were no vitals taken for this visit.  PROVIDERS: Merrilee Seashore, MD is PCP   Lauree Chandler, MD is Cardiologist  LABS: Labs reviewed: Acceptable for surgery. (all labs ordered are listed, but only abnormal results are displayed)  Labs Reviewed - No data to display   IMAGES:   EKG: 01/22/2019 Rate 60 bpm Sinus rhythm  Prolonged PR interval  Nonspecific IVCD with LAD Consider anterior infarct  CV: Myocardial Perfusion 09/27/2017  Nuclear stress EF: 76%. No wall motion abnormalities  There was no ST segment deviation noted during stress.  Defect 1: There is a small defect of mild severity  present in the apical lateral and apex location. No ischemia identified  Overall low risk study with no ischemia identified. Small apical lateral fixed defect consistent with old infarct. Old inferior ST elevation myocardial infarction by history.  Echo 01/24/2014 Study Conclusions   - Left ventricle: The cavity size was normal. There was mild  concentric hypertrophy. Systolic function was normal. The  estimated ejection fraction was in the range of 55% to 60%. Wall  motion was normal; there were no regional wall motion  abnormalities.  - Aortic valve: There was trivial regurgitation.  - Ascending aorta: The ascending aorta was mildly dilated.  - Mitral valve: Calcified annulus.   Past Medical History:  Diagnosis Date  . BMI 40.0-44.9, adult ([place])   . CAD in native artery, stent to RCA BMS with MI 2015 11/04/2003   a. inferior STEMI 01/2014 s/p BMS to prox RCA.  [name] DVT (deep venous thrombosis) ([place]) 2003   lower extremity DVT with questionable recurrence in 2005. Treated with 1.5 year course of coumadin. Another admission 2015.  . Endometrial cancer ([place])   . Endometrial thickening on ultra sound 07/2009   path showing Fragments of benign endocervical and squamous mucosa. No dysplasia or malignancy // Followed by Dr. [name]  . First degree AV block   . Gastric ulcer   . GERD (gastroesophageal reflux disease)   . GI bleed   . Hemorrhoid   . Hyperlipidemia LDL goal <70   . Hypertension   . Insomnia 11/02/2010  . Memory difficulties 11/02/2010  . Mild dilation of ascending aorta (HCC)   . Morbid obesity ([place])  09/16/2012  . Obesity   . Obstructive sleep apnea 09/23/2010  . Pre-diabetes   . Prediabetes DX: 09/2010   HgA1c 6.1  . Pulmonary nodule   . ST elevation myocardial infarction (STEMI) of inferior wall ([place]) 01/24/2014  . Stroke ([place]) 2015  . Syncope 12/18/2013  . Syncope, near 12/14/2018  . TIA (transient ischemic attack) 09/23/2010   09/13/2010: CT, MRI/MRA no  acute process. Carotid doppler: no stenosis. 2D-Echo Normal LV function with an EF 55-60%.   [name] UTI (urinary tract infection) 01/27/2014    Past Surgical History:  Procedure Laterality Date  . COLONOSCOPY W/ POLYPECTOMY  11/01/2007   8 mm rectal adenoma, hemorrhoids  . CORONARY ANGIOPLASTY WITH STENT PLACEMENT  01/24/14   Inf. STEMI, BMS to RCA  . DILATION AND CURETTAGE OF UTERUS N/A 03/28/2019   Procedure: DILATATION AND CURETTAGE OF UTERUS;  Surgeon: Lafonda Mosses, MD;  [facility];  Service: Gynecology;  Laterality: N/A;  . ESOPHAGOGASTRODUODENOSCOPY N/A 10/06/2018   Procedure: ESOPHAGOGASTRODUODENOSCOPY (EGD);  Surgeon: Doran Stabler, MD;  Location: [place];  Service: Gastroenterology;  Laterality: N/A;  . HOT HEMOSTASIS N/A 10/06/2018   Procedure: HOT HEMOSTASIS (ARGON PLASMA COAGULATION/BICAP);  Surgeon: Doran Stabler, MD;  Location: [place];  Service: Gastroenterology;  Laterality: N/A;  . INTRAUTERINE DEVICE (IUD) INSERTION N/A 03/28/2019   Procedure: INTRAUTERINE DEVICE (IUD) INSERTION;  Surgeon: Lafonda Mosses, MD;  [facility];  Service: Gynecology;  Laterality: N/A;  . KNEE SURGERY  1996   Left knee arthroscopy.  [name] LEFT HEART CATHETERIZATION WITH CORONARY ANGIOGRAM N/A 01/24/2014   Procedure: LEFT HEART CATHETERIZATION WITH CORONARY ANGIOGRAM;  Surgeon: Burnell Blanks, MD;  Location[facility] C[facility] LAB;  Service: Cardiovascular;  Laterality: N/A;  . ROTATOR CUFF REPAIR     right.  [name]  10/06/2018   Procedure: [name];  Surgeon: Doran Stabler, MD;  Location: [place];  Service: Gastroenterology;;  . TUBAL LIGATION      MEDICATIONS: . apixaban (ELIQUIS) 2.5 MG TABS tablet  . atorvastatin (LIPITOR) 80 MG tablet  . cholecalciferol (VITAMIN D3) 25 MCG (1000 UNIT) tablet  . ferrous gluconate (IRON 27) 240 (27 FE) MG tablet  . ibuprofen (ADVIL) 200 MG tablet  . lisinopril (ZESTRIL) 40 MG tablet  . metoprolol  succinate (TOPROL-XL) 25 MG 24 hr tablet  . Multiple Vitamin (MULTIVITAMIN WITH MINERALS) TABS  . Naphazoline HCl (CLEAR EYES OP)  . nitrofurantoin, macrocrystal-monohydrate, (MACROBID) 100 MG capsule  . nitroGLYCERIN (NITROSTAT) 0.4 MG SL tablet  . pantoprazole (PROTONIX) 40 MG tablet  . senna-docusate (SENOKOT-S) 8.6-50 MG tablet  . sodium chloride (OCEAN) 0.65 % SOLN nasal spray  . traMADol (ULTRAM) 50 MG tablet   No current facility-administered medications for this encounter.   Maia Plan [facility] Pre-Surgical Testing [phone] 09/07/19  11:40 AM

## 2019-09-08 LAB — SARS CORONAVIRUS 2 (TAT 6-24 HRS): SARS Coronavirus 2: NEGATIVE

## 2019-09-10 ENCOUNTER — Telehealth: Payer: Self-pay

## 2019-09-10 ENCOUNTER — Telehealth: Payer: Self-pay | Admitting: *Deleted

## 2019-09-10 NOTE — Telephone Encounter (Signed)
Shirley [name] stated that she understands her pre op instructions. Confirmed that her last dose of elliquis was Saturday 09-08-19 in the evening.

## 2019-09-10 NOTE — Anesthesia Preprocedure Evaluation (Addendum)
Anesthesia Evaluation  Patient identified by MRN, date of birth, ID band Patient awake    Reviewed: Allergy & Precautions, NPO status , Patient's Chart, lab work & pertinent test results  History of Anesthesia Complications Negative for: history of anesthetic complications  Airway Mallampati: II  TM Distance: >3 FB Neck ROM: Full    Dental  (+) Dental Advisory Given, Upper Dentures   Pulmonary sleep apnea ,    Pulmonary exam normal        Cardiovascular hypertension, Pt. on medications and Pt. on home beta blockers + CAD, + Past MI, + Cardiac Stents and + DVT  Normal cardiovascular exam+ dysrhythmias    '19 Myoperfusion - Nuclear stress EF: 76%. No wall motion abnormalities There was no ST segment deviation noted during stress. Defect 1: There is a small defect of mild severity present in the apical lateral and apex location. No ischemia identified Overall low risk study with no ischemia identified. Small apical lateral fixed defect consistent with old infarct. Old inferior ST elevation myocardial infarction by history.    Neuro/Psych TIACVA, No Residual Symptoms negative psych ROS   GI/Hepatic Neg liver ROS, PUD, GERD  Medicated and Controlled,  Endo/Other  Morbid obesity Pre-diabetes   Renal/GU negative Renal ROS     Musculoskeletal negative musculoskeletal ROS (+)   Abdominal (+) + obese,   Peds  Hematology  On eliquis    Anesthesia Other Findings Covid neg 6/4  Reproductive/Obstetrics  Endometrial cancer                             Anesthesia Physical Anesthesia Plan  ASA: III  Anesthesia Plan: General   Post-op Pain Management:    Induction: Intravenous  PONV Risk Score and Plan: 3 and Treatment may vary due to age or medical condition, Ondansetron and Dexamethasone  Airway Management Planned: Oral ETT  Additional Equipment: None  Intra-op Plan:    Post-operative Plan: Extubation in OR  Informed Consent: I have reviewed the patients History and Physical, chart, labs and discussed the procedure including the risks, benefits and alternatives for the proposed anesthesia with the patient or authorized representative who has indicated his/her understanding and acceptance.     Dental advisory given  Plan Discussed with: CRNA and Anesthesiologist  Anesthesia Plan Comments:        Anesthesia Quick Evaluation

## 2019-09-10 NOTE — Telephone Encounter (Signed)
Patient's daughter called and confirmed surgery and arrival time

## 2019-09-11 ENCOUNTER — Ambulatory Visit (HOSPITAL_COMMUNITY): Payer: Medicare Other | Admitting: Anesthesiology

## 2019-09-11 ENCOUNTER — Ambulatory Visit (HOSPITAL_COMMUNITY): Payer: Medicare Other | Admitting: Physician Assistant

## 2019-09-11 ENCOUNTER — Other Ambulatory Visit: Payer: Self-pay

## 2019-09-11 ENCOUNTER — Ambulatory Visit (HOSPITAL_COMMUNITY)
Admission: RE | Admit: 2019-09-11 | Discharge: 2019-09-11 | Disposition: A | Discharge status: Home / Self Care | Payer: Medicare Other | Attending: Gynecologic Oncology | Admitting: Gynecologic Oncology

## 2019-09-11 ENCOUNTER — Encounter (HOSPITAL_COMMUNITY)
Admission: RE | Disposition: A | Discharge status: Home / Self Care | Payer: Self-pay | Source: Home / Self Care | Attending: Gynecologic Oncology

## 2019-09-11 ENCOUNTER — Encounter (HOSPITAL_COMMUNITY): Payer: Self-pay | Admitting: Gynecologic Oncology

## 2019-09-11 DIAGNOSIS — Z86718 Personal history of other venous thrombosis and embolism: Secondary | ICD-10-CM | POA: Insufficient documentation

## 2019-09-11 DIAGNOSIS — Z800 Family history of malignant neoplasm of digestive organs: Secondary | ICD-10-CM | POA: Diagnosis not present

## 2019-09-11 DIAGNOSIS — Z808 Family history of malignant neoplasm of other organs or systems: Secondary | ICD-10-CM | POA: Insufficient documentation

## 2019-09-11 DIAGNOSIS — Z820 Family history of epilepsy and other diseases of the nervous system: Secondary | ICD-10-CM | POA: Insufficient documentation

## 2019-09-11 DIAGNOSIS — I2510 Atherosclerotic heart disease of native coronary artery without angina pectoris: Secondary | ICD-10-CM | POA: Insufficient documentation

## 2019-09-11 DIAGNOSIS — E785 Hyperlipidemia, unspecified: Secondary | ICD-10-CM | POA: Insufficient documentation

## 2019-09-11 DIAGNOSIS — Z955 Presence of coronary angioplasty implant and graft: Secondary | ICD-10-CM | POA: Diagnosis not present

## 2019-09-11 DIAGNOSIS — Z8673 Personal history of transient ischemic attack (TIA), and cerebral infarction without residual deficits: Secondary | ICD-10-CM | POA: Diagnosis not present

## 2019-09-11 DIAGNOSIS — R911 Solitary pulmonary nodule: Secondary | ICD-10-CM | POA: Insufficient documentation

## 2019-09-11 DIAGNOSIS — I10 Essential (primary) hypertension: Secondary | ICD-10-CM | POA: Diagnosis not present

## 2019-09-11 DIAGNOSIS — D259 Leiomyoma of uterus, unspecified: Secondary | ICD-10-CM | POA: Diagnosis not present

## 2019-09-11 DIAGNOSIS — G4733 Obstructive sleep apnea (adult) (pediatric): Secondary | ICD-10-CM | POA: Diagnosis not present

## 2019-09-11 DIAGNOSIS — G4700 Insomnia, unspecified: Secondary | ICD-10-CM | POA: Insufficient documentation

## 2019-09-11 DIAGNOSIS — K219 Gastro-esophageal reflux disease without esophagitis: Secondary | ICD-10-CM | POA: Diagnosis not present

## 2019-09-11 DIAGNOSIS — Z833 Family history of diabetes mellitus: Secondary | ICD-10-CM | POA: Insufficient documentation

## 2019-09-11 DIAGNOSIS — Z6841 Body Mass Index (BMI) 40.0 and over, adult: Secondary | ICD-10-CM | POA: Insufficient documentation

## 2019-09-11 DIAGNOSIS — G4730 Sleep apnea, unspecified: Secondary | ICD-10-CM | POA: Insufficient documentation

## 2019-09-11 DIAGNOSIS — C541 Malignant neoplasm of endometrium: Secondary | ICD-10-CM | POA: Diagnosis present

## 2019-09-11 DIAGNOSIS — I440 Atrioventricular block, first degree: Secondary | ICD-10-CM | POA: Diagnosis not present

## 2019-09-11 DIAGNOSIS — G459 Transient cerebral ischemic attack, unspecified: Secondary | ICD-10-CM | POA: Diagnosis not present

## 2019-09-11 DIAGNOSIS — I252 Old myocardial infarction: Secondary | ICD-10-CM | POA: Diagnosis not present

## 2019-09-11 DIAGNOSIS — R7303 Prediabetes: Secondary | ICD-10-CM | POA: Diagnosis not present

## 2019-09-11 DIAGNOSIS — Z8249 Family history of ischemic heart disease and other diseases of the circulatory system: Secondary | ICD-10-CM | POA: Diagnosis not present

## 2019-09-11 DIAGNOSIS — K279 Peptic ulcer, site unspecified, unspecified as acute or chronic, without hemorrhage or perforation: Secondary | ICD-10-CM | POA: Insufficient documentation

## 2019-09-11 DIAGNOSIS — Z86010 Personal history of colonic polyps: Secondary | ICD-10-CM | POA: Diagnosis not present

## 2019-09-11 HISTORY — PX: ROBOTIC ASSISTED TOTAL HYSTERECTOMY WITH BILATERAL SALPINGO OOPHERECTOMY: SHX6086

## 2019-09-11 HISTORY — PX: SENTINEL NODE BIOPSY: SHX6608

## 2019-09-11 LAB — TYPE AND SCREEN
ABO/RH(D): A POS
Antibody Screen: NEGATIVE

## 2019-09-11 SURGERY — HYSTERECTOMY, TOTAL, ROBOT-ASSISTED, LAPAROSCOPIC, WITH BILATERAL SALPINGO-OOPHORECTOMY
Anesthesia: General

## 2019-09-11 MED ORDER — OXYCODONE HCL 5 MG PO TABS: 5.0000 mg | ORAL_TABLET | Freq: Once | ORAL | Status: DC | PRN

## 2019-09-11 MED ORDER — HEPARIN SODIUM (PORCINE) 5000 UNIT/ML IJ SOLN
5000.0000 [IU] | INTRAMUSCULAR | Status: AC
  Administered 2019-09-11: 5000 [IU] via SUBCUTANEOUS
  Filled 2019-09-11: qty 1

## 2019-09-11 MED ORDER — ROCURONIUM BROMIDE 10 MG/ML (PF) SYRINGE
PREFILLED_SYRINGE | INTRAVENOUS | Status: DC | PRN
  Administered 2019-09-11: 60 mg via INTRAVENOUS

## 2019-09-11 MED ORDER — BUPIVACAINE HCL (PF) 0.25 % IJ SOLN
INTRAMUSCULAR | Status: AC
  Filled 2019-09-11: qty 30

## 2019-09-11 MED ORDER — STERILE WATER FOR INJECTION IJ SOLN
INTRAMUSCULAR | Status: DC | PRN
  Administered 2019-09-11: 4 mL

## 2019-09-11 MED ORDER — BUPIVACAINE-EPINEPHRINE 0.5% -1:200000 IJ SOLN
INTRAMUSCULAR | Status: AC
  Filled 2019-09-11: qty 1

## 2019-09-11 MED ORDER — LIDOCAINE HCL 2 % IJ SOLN
INTRAMUSCULAR | Status: AC
  Filled 2019-09-11: qty 20

## 2019-09-11 MED ORDER — HYDRALAZINE HCL 20 MG/ML IJ SOLN: 5.0000 mg | INTRAMUSCULAR | Status: DC | PRN

## 2019-09-11 MED ORDER — EPHEDRINE 5 MG/ML INJ
INTRAVENOUS | Status: AC
  Filled 2019-09-11: qty 10

## 2019-09-11 MED ORDER — ROCURONIUM BROMIDE 10 MG/ML (PF) SYRINGE
PREFILLED_SYRINGE | INTRAVENOUS | Status: AC
  Filled 2019-09-11: qty 10

## 2019-09-11 MED ORDER — LIDOCAINE 2% (20 MG/ML) 5 ML SYRINGE
INTRAMUSCULAR | Status: DC | PRN
  Administered 2019-09-11: 60 mg via INTRAVENOUS
  Administered 2019-09-11 (×3): 20 mg via INTRAVENOUS

## 2019-09-11 MED ORDER — FENTANYL CITRATE (PF) 250 MCG/5ML IJ SOLN
INTRAMUSCULAR | Status: DC | PRN
  Administered 2019-09-11: 100 ug via INTRAVENOUS
  Administered 2019-09-11 (×3): 50 ug via INTRAVENOUS

## 2019-09-11 MED ORDER — FENTANYL CITRATE (PF) 100 MCG/2ML IJ SOLN: 25.0000 ug | INTRAMUSCULAR | Status: DC | PRN

## 2019-09-11 MED ORDER — LACTATED RINGERS IR SOLN
Status: DC | PRN
  Administered 2019-09-11: 1

## 2019-09-11 MED ORDER — ACETAMINOPHEN 500 MG PO TABS
1000.0000 mg | ORAL_TABLET | ORAL | Status: AC
  Administered 2019-09-11: 1000 mg via ORAL
  Filled 2019-09-11: qty 2

## 2019-09-11 MED ORDER — ONDANSETRON HCL 4 MG/2ML IJ SOLN: 4.0000 mg | Freq: Once | INTRAMUSCULAR | Status: DC | PRN

## 2019-09-11 MED ORDER — STERILE WATER FOR IRRIGATION IR SOLN
Status: DC | PRN
  Administered 2019-09-11: 1000 mL

## 2019-09-11 MED ORDER — STERILE WATER FOR INJECTION IJ SOLN
INTRAMUSCULAR | Status: AC
  Filled 2019-09-11: qty 10

## 2019-09-11 MED ORDER — SUGAMMADEX SODIUM 500 MG/5ML IV SOLN
INTRAVENOUS | Status: AC
  Filled 2019-09-11: qty 5

## 2019-09-11 MED ORDER — PROPOFOL 10 MG/ML IV BOLUS
INTRAVENOUS | Status: AC
  Filled 2019-09-11: qty 20

## 2019-09-11 MED ORDER — CEFAZOLIN SODIUM-DEXTROSE 2-4 GM/100ML-% IV SOLN
2.0000 g | INTRAVENOUS | Status: AC
  Administered 2019-09-11: 2 g via INTRAVENOUS
  Filled 2019-09-11: qty 100

## 2019-09-11 MED ORDER — SUGAMMADEX SODIUM 200 MG/2ML IV SOLN
INTRAVENOUS | Status: DC | PRN
  Administered 2019-09-11: 250 mg via INTRAVENOUS

## 2019-09-11 MED ORDER — PROPOFOL 10 MG/ML IV BOLUS
INTRAVENOUS | Status: DC | PRN
  Administered 2019-09-11: 100 mg via INTRAVENOUS

## 2019-09-11 MED ORDER — ONDANSETRON HCL 4 MG/2ML IJ SOLN
INTRAMUSCULAR | Status: DC | PRN
  Administered 2019-09-11: 4 mg via INTRAVENOUS

## 2019-09-11 MED ORDER — SODIUM CHLORIDE 0.9% FLUSH: 3.0000 mL | Freq: Two times a day (BID) | INTRAVENOUS | Status: DC

## 2019-09-11 MED ORDER — LIDOCAINE 2% (20 MG/ML) 5 ML SYRINGE
INTRAMUSCULAR | Status: AC
  Filled 2019-09-11: qty 5

## 2019-09-11 MED ORDER — EPHEDRINE SULFATE-NACL 50-0.9 MG/10ML-% IV SOSY
PREFILLED_SYRINGE | INTRAVENOUS | Status: DC | PRN
  Administered 2019-09-11: 10 mg via INTRAVENOUS

## 2019-09-11 MED ORDER — BUPIVACAINE HCL 0.25 % IJ SOLN
INTRAMUSCULAR | Status: AC
  Filled 2019-09-11: qty 1

## 2019-09-11 MED ORDER — ONDANSETRON HCL 4 MG/2ML IJ SOLN
INTRAMUSCULAR | Status: AC
  Filled 2019-09-11: qty 2

## 2019-09-11 MED ORDER — KETAMINE HCL 10 MG/ML IJ SOLN
INTRAMUSCULAR | Status: AC
  Filled 2019-09-11: qty 1

## 2019-09-11 MED ORDER — LIDOCAINE 2% (20 MG/ML) 5 ML SYRINGE
INTRAMUSCULAR | Status: DC | PRN
  Administered 2019-09-11: 1.5 mg/kg/h via INTRAVENOUS

## 2019-09-11 MED ORDER — KETAMINE HCL 10 MG/ML IJ SOLN
INTRAMUSCULAR | Status: DC | PRN
  Administered 2019-09-11: 30 mg via INTRAVENOUS
  Administered 2019-09-11: 10 mg via INTRAVENOUS

## 2019-09-11 MED ORDER — FENTANYL CITRATE (PF) 250 MCG/5ML IJ SOLN
INTRAMUSCULAR | Status: AC
  Filled 2019-09-11: qty 5

## 2019-09-11 MED ORDER — LACTATED RINGERS IV SOLN: INTRAVENOUS | Status: DC

## 2019-09-11 MED ORDER — DEXAMETHASONE SODIUM PHOSPHATE 10 MG/ML IJ SOLN
INTRAMUSCULAR | Status: AC
  Filled 2019-09-11: qty 1

## 2019-09-11 MED ORDER — OXYCODONE HCL 5 MG/5ML PO SOLN: 5.0000 mg | Freq: Once | ORAL | Status: DC | PRN

## 2019-09-11 MED ORDER — DEXAMETHASONE SODIUM PHOSPHATE 4 MG/ML IJ SOLN: 4.0000 mg | INTRAMUSCULAR | Status: DC

## 2019-09-11 MED ORDER — BUPIVACAINE HCL 0.25 % IJ SOLN
INTRAMUSCULAR | Status: DC | PRN
  Administered 2019-09-11: 45 mL

## 2019-09-11 MED ORDER — DEXAMETHASONE SODIUM PHOSPHATE 10 MG/ML IJ SOLN
INTRAMUSCULAR | Status: DC | PRN
  Administered 2019-09-11: 10 mg via INTRAVENOUS

## 2019-09-11 SURGICAL SUPPLY — 77 items
ADH SKN CLS APL DERMABOND .7 (GAUZE/BANDAGES/DRESSINGS) ×1
AGENT HMST KT MTR STRL THRMB (HEMOSTASIS)
APL ESCP 34 STRL LF DISP (HEMOSTASIS)
APPLICATOR SURGIFLO ENDO (HEMOSTASIS) IMPLANT
BACTOSHIELD CHG 4% 4OZ (MISCELLANEOUS) ×2
BAG LAPAROSCOPIC 12 15 PORT 16 (BASKET) IMPLANT
BAG RETRIEVAL 12/15 (BASKET)
BAG RETRIEVAL 12/15MM (BASKET)
BAG SPEC RTRVL LRG 6X4 10 (ENDOMECHANICALS)
BLADE SURG SZ10 CARB STEEL (BLADE) IMPLANT
COVER BACK TABLE 60X90IN (DRAPES) ×3 IMPLANT
COVER TIP SHEARS 8 DVNC (MISCELLANEOUS) ×1 IMPLANT
COVER TIP SHEARS 8MM DA VINCI (MISCELLANEOUS) ×3
COVER WAND RF STERILE (DRAPES) IMPLANT
DECANTER SPIKE VIAL GLASS SM (MISCELLANEOUS) IMPLANT
DERMABOND ADVANCED (GAUZE/BANDAGES/DRESSINGS) ×2
DERMABOND ADVANCED .7 DNX12 (GAUZE/BANDAGES/DRESSINGS) ×1 IMPLANT
DRAPE ARM DVNC X/XI (DISPOSABLE) ×4 IMPLANT
DRAPE COLUMN DVNC XI (DISPOSABLE) ×1 IMPLANT
DRAPE DA VINCI XI ARM (DISPOSABLE) ×12
DRAPE DA VINCI XI COLUMN (DISPOSABLE) ×3
DRAPE SHEET LG 3/4 BI-LAMINATE (DRAPES) ×3 IMPLANT
DRAPE SURG IRRIG POUCH 19X23 (DRAPES) ×3 IMPLANT
DRSG OPSITE POSTOP 4X6 (GAUZE/BANDAGES/DRESSINGS) IMPLANT
DRSG OPSITE POSTOP 4X8 (GAUZE/BANDAGES/DRESSINGS) IMPLANT
ELECT PENCIL ROCKER SW 15FT (MISCELLANEOUS) ×2 IMPLANT
ELECT REM PT RETURN 15FT ADLT (MISCELLANEOUS) ×3 IMPLANT
GAUZE 4X4 16PLY RFD (DISPOSABLE) ×2 IMPLANT
GLOVE BIO SURGEON STRL SZ 6 (GLOVE) ×12 IMPLANT
GLOVE BIO SURGEON STRL SZ 6.5 (GLOVE) ×4 IMPLANT
GLOVE BIO SURGEONS STRL SZ 6.5 (GLOVE) ×2
GOWN STRL REUS W/ TWL LRG LVL3 (GOWN DISPOSABLE) ×4 IMPLANT
GOWN STRL REUS W/TWL LRG LVL3 (GOWN DISPOSABLE) ×12
HOLDER FOLEY CATH W/STRAP (MISCELLANEOUS) ×3 IMPLANT
IRRIG SUCT STRYKERFLOW 2 WTIP (MISCELLANEOUS) ×3
IRRIGATION SUCT STRKRFLW 2 WTP (MISCELLANEOUS) ×1 IMPLANT
KIT PROCEDURE DA VINCI SI (MISCELLANEOUS) ×3
KIT PROCEDURE DVNC SI (MISCELLANEOUS) IMPLANT
KIT TURNOVER KIT A (KITS) IMPLANT
MANIPULATOR UTERINE 4.5 ZUMI (MISCELLANEOUS) ×3 IMPLANT
NDL HYPO 21X1.5 SAFETY (NEEDLE) ×1 IMPLANT
NDL SPNL 18GX3.5 QUINCKE PK (NEEDLE) IMPLANT
NEEDLE HYPO 21X1.5 SAFETY (NEEDLE) ×3 IMPLANT
NEEDLE SPNL 18GX3.5 QUINCKE PK (NEEDLE) ×3 IMPLANT
OBTURATOR OPTICAL STANDARD 8MM (TROCAR) ×3
OBTURATOR OPTICAL STND 8 DVNC (TROCAR) ×1
OBTURATOR OPTICALSTD 8 DVNC (TROCAR) ×1 IMPLANT
PACK ROBOT GYN WLCUSTOM (TRAY / TRAY PROCEDURE) ×3 IMPLANT
PAD POSITIONING PINK XL (MISCELLANEOUS) ×3 IMPLANT
PENCIL SMOKE EVACUATOR (MISCELLANEOUS) IMPLANT
PORT ACCESS TROCAR AIRSEAL 12 (TROCAR) ×1 IMPLANT
PORT ACCESS TROCAR AIRSEAL 5M (TROCAR) ×2
POUCH SPECIMEN RETRIEVAL 10MM (ENDOMECHANICALS) IMPLANT
SCRUB CHG 4% DYNA-HEX 4OZ (MISCELLANEOUS) ×1 IMPLANT
SEAL CANN UNIV 5-8 DVNC XI (MISCELLANEOUS) ×3 IMPLANT
SEAL XI 5MM-8MM UNIVERSAL (MISCELLANEOUS) ×9
SET TRI-LUMEN FLTR TB AIRSEAL (TUBING) ×3 IMPLANT
SPONGE LAP 18X18 RF (DISPOSABLE) IMPLANT
SURGIFLO W/THROMBIN 8M KIT (HEMOSTASIS) IMPLANT
SUT MNCRL AB 4-0 PS2 18 (SUTURE) IMPLANT
SUT PDS AB 1 TP1 96 (SUTURE) IMPLANT
SUT VIC AB 0 CT1 27 (SUTURE) ×3
SUT VIC AB 0 CT1 27XBRD ANTBC (SUTURE) IMPLANT
SUT VIC AB 2-0 CT1 27 (SUTURE) ×6
SUT VIC AB 2-0 CT1 27XBRD (SUTURE) IMPLANT
SUT VIC AB 2-0 CT1 TAPERPNT 27 (SUTURE) IMPLANT
SUT VIC AB 2-0 SH 27 (SUTURE) ×3
SUT VIC AB 2-0 SH 27X BRD (SUTURE) IMPLANT
SUT VICRYL 4-0 PS2 18IN ABS (SUTURE) ×6 IMPLANT
SYR 10ML LL (SYRINGE) IMPLANT
TOWEL OR NON WOVEN STRL DISP B (DISPOSABLE) ×3 IMPLANT
TRAP SPECIMEN MUCUS 40CC (MISCELLANEOUS) IMPLANT
TRAY FOLEY MTR SLVR 16FR STAT (SET/KITS/TRAYS/PACK) ×3 IMPLANT
TROCAR XCEL NON-BLD 5MMX100MML (ENDOMECHANICALS) IMPLANT
UNDERPAD 30X36 HEAVY ABSORB (UNDERPADS AND DIAPERS) ×3 IMPLANT
WATER STERILE IRR 1000ML POUR (IV SOLUTION) ×3 IMPLANT
YANKAUER SUCT BULB TIP 10FT TU (MISCELLANEOUS) IMPLANT

## 2019-09-11 NOTE — Anesthesia Procedure Notes (Signed)
Procedure Name: Intubation Date/Time: 09/11/2019 7:40 AM Performed by: Sharlette Dense, CRNA Patient Re-evaluated:Patient Re-evaluated prior to induction Oxygen Delivery Method: Circle system utilized Preoxygenation: Pre-oxygenation with 100% oxygen Induction Type: IV induction Ventilation: Mask ventilation without difficulty and Oral airway inserted - appropriate to patient size Laryngoscope Size: Mac and 3 Grade View: Grade I Tube type: Oral Tube size: 7.5 mm Number of attempts: 1 Airway Equipment and Method: Stylet Placement Confirmation: ETT inserted through vocal cords under direct vision,  positive ETCO2 and breath sounds checked- equal and bilateral Secured at: 20 cm Tube secured with: Tape Dental Injury: Teeth and Oropharynx as per pre-operative assessment  Comments: Intubation performed by Delana Meyer, EMT student

## 2019-09-11 NOTE — Op Note (Signed)
OPERATIVE NOTE  Pre-operative Diagnosis: endometrial cancer grade 1  Post-operative Diagnosis: same  Operation: Robotic-assisted laparoscopic total hysterectomy with bilateral salpingoophorectomy, SLN biopsy   Surgeon: Jeral Pinch MD  Assistant Surgeon: Lahoma Crocker MD (an MD assistant was necessary for tissue manipulation, management of robotic instrumentation, retraction and positioning due to the complexity of the case and hospital policies).   Anesthesia: GET  Urine Output: 600cc  Operative Findings: On EUA, 8-10cm uterus, mobile. On intra-abdominal entry, normal upper abdominal survey. Normal appearing small and large bowel, omentum. Normal appearing bilateral adnexa. Uterus and bulbous, especially at the fundus, with several 1-2cm intra-mural fibroids. Mapping successful to bilateral obturator nodes. IUD noted within the uterus after specimen removal. NO intra-abdominal or pelvic evidence of disease.  Estimated Blood Loss:  150cc      Total IV Fluids: 1,000 ml         Specimens: uterus, cervix, bilateral tubes and ovaries, bilateral obturator SLNs         Complications:  None apparent; patient tolerated the procedure well.         Disposition: PACU - hemodynamically stable.  Procedure Details  The patient was seen in the Holding Room. The risks, benefits, complications, treatment options, and expected outcomes were discussed with the patient.  The patient concurred with the proposed plan, giving informed consent.  The site of surgery properly noted/marked. The patient was identified as Shirley [name] and the procedure verified as a Robotic-assisted hysterectomy with bilateral salpingo oophorectomy with SLN biopsy.   After induction of anesthesia, the patient was draped and prepped in the usual sterile manner. Patient was placed in supine position after anesthesia and draped and prepped in the usual sterile manner as follows: Her arms were tucked to her side with  all appropriate precautions.  The shoulders were stabilized with padded shoulder blocks applied to the acromium processes.  The patient was placed in the semi-lithotomy position in [place].  The perineum and vagina were prepped with CholoraPrep. The patient was draped after the CholoraPrep had been allowed to dry for 3 minutes.  A Time Out was held and the above information confirmed.  The urethra was prepped with Betadine. Foley catheter was placed.  A sterile speculum was placed in the vagina.  The cervix was grasped with a single-tooth tenaculum. 2mg  total of ICG was injected into the cervical stroma at 2 and 9 o'clock with 1cc injected at a 1cm and 45mm depth (concentration 0.5mg /ml) in all locations. The cervix was dilated with Kennon Rounds dilators.  The ZUMI uterine manipulator with a medium colpotomizer ring was placed without difficulty.  A pneum occluder balloon was placed over the manipulator.  OG tube placement was confirmed and to suction.   Next, a 10 mm skin incision was made 1 cm below the subcostal margin in the midclavicular line.  The 5 mm Optiview port and scope was used for direct entry.  Opening pressure was under 10 mm CO2.  The abdomen was insufflated and the findings were noted as above.   At this point and all points during the procedure, the patient's intra-abdominal pressure did not exceed 15 mmHg. Next, an 8 mm skin incision was made superior to the umbilicus and a right and left port were placed about 8 cm lateral to the robot port on the right and left side.  A fourth arm was placed on the right.  The 5 mm assist trocar was exchanged for a 10-12 mm port. All ports were placed under  direct visualization.  The patient was placed in steep Trendelenburg.  Bowel was folded away into the upper abdomen.  The robot was docked in the normal manner.  The right and left peritoneum were opened parallel to the IP ligament to open the retroperitoneal spaces bilaterally. The round ligaments were  transected. The SLN mapping was performed in bilateral pelvic basins. After identifying the ureters, the para rectal and paravesical spaces were opened up entirely with careful dissection below the level of the ureters bilaterally and to the depth of the uterine artery origin in order to skeletonize the uterine "web" and ensure visualization of all parametrial channels. The para-aortic basins were carefully exposed and evaluated for isolated para-aortic SLN's. Lymphatic channels were identified travelling to the following visualized sentinel lymph node's: bilateral obturator. These SLN's were separated from their surrounding lymphatic tissue, removed and sent for permanent pathology.  The hysterectomy was started.  The ureter was again noted to be on the medial leaf of the broad ligament.  The peritoneum above the ureter was incised and stretched and the infundibulopelvic ligament was skeletonized, cauterized and cut.  The posterior peritoneum was taken down to the level of the KOH ring.  The anterior peritoneum was also taken down.  The bladder flap was created to the level of the KOH ring.  The uterine artery on the right side was skeletonized, cauterized and cut in the normal manner.  A similar procedure was performed on the left.  The colpotomy was made and the uterus, cervix, bilateral ovaries and tubes were amputated and delivered through the vagina.  Pedicles were inspected and excellent hemostasis was achieved.    Some bleeding was noted from a high left vaginal sidewall tear. Two figure of eight stiches with 2-0 Vicryl were used to obtain hemostasis. The colpotomy at the vaginal cuff was closed with Vicryl on a CT1 needle in running manner.  Irrigation was used and excellent hemostasis was achieved.  At this point in the procedure was completed.  Robotic instruments were removed under direct visulaization.  The robot was undocked. The fascia at the 10-12 mm port was closed with 0 Vicryl on a UR-5  needle.  The subcuticular tissue was closed with 4-0 Vicryl and the skin was closed with 4-0 Monocryl in a subcuticular manner.  Dermabond was applied.    Attention was then turned to the vagina. A left sulcal tear was noted and closed with a running 2-0 Vicryl stitch. The vagina was swabbed with  minimal bleeding noted.   All sponge, lap and needle counts were correct x  3.   The patient was transferred to the recovery room in stable condition.  Jeral Pinch, MD

## 2019-09-11 NOTE — Interval H&P Note (Signed)
History and Physical Interval Note:  09/11/2019 6:59 AM  Shirley [name]  has presented today for surgery, with the diagnosis of ENDOMETRIAL CANCER.  The various methods of treatment have been discussed with the patient and family. After consideration of risks, benefits and other options for treatment, the patient has consented to  Procedure(s): XI ROBOTIC ASSISTED TOTAL HYSTERECTOMY WITH BILATERAL SALPINGO OOPHORECTOMY, POSSIBLE LYMPH NODE DISSECTION, POSSIBLE LAPAROTOMY (N/A) SENTINEL NODE BIOPSY (N/A) as a surgical intervention.  The patient's history has been reviewed, patient examined, no change in status, stable for surgery.  I have reviewed the patient's chart and labs.  Questions were answered to the patient's satisfaction.     Lafonda Mosses

## 2019-09-11 NOTE — Transfer of Care (Signed)
Immediate Anesthesia Transfer of Care Note  [name]  Procedure(s) Performed: XI ROBOTIC ASSISTED TOTAL HYSTERECTOMY WITH BILATERAL SALPINGO OOPHORECTOMY, LYMPH NODE DISSECTION (N/A ) SENTINEL NODE BIOPSY (N/A )  Patient Location: PACU  Anesthesia Type:General  Level of Consciousness: drowsy  Airway & Oxygen Therapy: Patient Spontanous Breathing and Patient connected to face mask oxygen  Post-op Assessment: Report given to RN and Post -op Vital signs reviewed and stable  Post vital signs: Reviewed and stable  Last Vitals:  Vitals Value Taken Time  BP 146/100 09/11/19 1039  Temp    Pulse 59 09/11/19 1041  Resp 15 09/11/19 1041  SpO2 100 % 09/11/19 1041  Vitals shown include unvalidated device data.  Last Pain:  Vitals:   09/11/19 0631  TempSrc:   PainSc: 0-No pain         Complications: No apparent anesthesia complications

## 2019-09-11 NOTE — Anesthesia Postprocedure Evaluation (Signed)
Anesthesia Post Note  [name]  Procedure(s) Performed: XI ROBOTIC ASSISTED TOTAL HYSTERECTOMY WITH BILATERAL SALPINGO OOPHORECTOMY, LYMPH NODE DISSECTION (N/A ) SENTINEL NODE BIOPSY (N/A )     Patient location during evaluation: PACU Anesthesia Type: General Level of consciousness: awake and alert Pain management: pain level controlled Vital Signs Assessment: post-procedure vital signs reviewed and stable Respiratory status: spontaneous breathing, nonlabored ventilation and respiratory function stable Cardiovascular status: blood pressure returned to baseline and stable Postop Assessment: no apparent nausea or vomiting Anesthetic complications: no    Last Vitals:  Vitals:   09/11/19 1130 09/11/19 1200  BP: (!) 166/84 126/72  Pulse: 63 (!) 59  Resp: 18 18  Temp: 36.4 C 36.6 C  SpO2: 94% 97%    Last Pain:  Vitals:   09/11/19 1200  TempSrc:   PainSc: 0-No pain                 Audry Pili

## 2019-09-11 NOTE — Treatment Plan (Signed)
Called patient's contact, daughter, with surgery updates. No answer. Left VM. Jeral Pinch MD Gynecologic Oncology

## 2019-09-11 NOTE — Discharge Instructions (Signed)
Activity: 1. Be up and out of the bed during the day.  Take a nap if needed.  You may walk up steps but be careful and use the hand rail.  Stair climbing will tire you more than you think, you may need to stop part way and rest.   2. No lifting or straining for 4 weeks.  3. No driving for 1 weeks.  Do Not drive if you are taking narcotic pain medicine.  4. Shower daily.  Use soap and water on your incision and pat dry; don't rub.   5. No sexual activity and nothing in the vagina for 8 weeks.  Medications:  - Take ibuprofen and tylenol first line for pain control. Take these regularly (every 6 hours) to decrease the build up of pain.  - If necessary, for severe pain not relieved by ibuprofen, contact Dr Serita Grit office and you will be prescribed percocet.  - While taking percocet you should take sennakot every night to reduce the likelihood of constipation. If this causes diarrhea, stop its use.  Diet: 1. Low sodium Heart Healthy Diet is recommended.  2. It is safe to use a laxative if you have difficulty moving your bowels.   Wound Care: 1. Keep clean and dry.  Shower daily.  Reasons to call the Doctor:   Fever - Oral temperature greater than 100.4 degrees Fahrenheit  Foul-smelling vaginal discharge  Difficulty urinating  Nausea and vomiting  Increased pain at the site of the incision that is unrelieved with pain medicine.  Difficulty breathing with or without chest pain  New calf pain especially if only on one side  Sudden, continuing increased vaginal bleeding with or without clots.   Follow-up: 1. See Wyvonnia Lora in 4 weeks.  Contacts: For questions or concerns you should contact:  Dr. Wyvonnia Lora at [phone] After hours and on week-ends call [phone] and ask to speak to the physician on call for Gynecologic Oncology  After Your Surgery  The information in this section will tell you what to expect after your surgery, both during your stay  and after you leave. You will learn how to safely recover from your surgery. Write down any questions you have and be sure to ask your doctor or nurse.  What to Expect When you wake up after your surgery, you will be in the [facility] ([facility]) or your recovery room. A nurse will be monitoring your body temperature, blood pressure, pulse, and oxygen levels. You may have a urinary catheter in your bladder to help monitor the amount of urine you are making. It should come out before you go home. You will also have compression boots on your lower legs to help your circulation. Your pain medication will be given through an IV line or in tablet form. If you are having pain, tell your nurse. Your nurse will tell you how to recover from your surgery. Below are examples of ways you can help yourself recover safely. . You will be encouraged to walk with the help of your nurse or physical therapist. We will give you medication to relieve pain. Walking helps reduce the risk for blood clots and pneumonia. It also helps to stimulate your bowels so they begin working again. . Use your incentive spirometer. This will help your lungs expand, which prevents pneumonia.   Commonly Asked Questions  Will I have pain after surgery? Yes, you will have some pain after your surgery, especially in the first few days.  Your doctor and nurse will ask you about your pain often. You will be given medication to manage your pain as needed. If your pain is not relieved, please tell your doctor or nurse. It is important to control your pain so you can cough, breathe deeply, use your incentive spirometer, and get out of bed and walk.  Will I be able to eat? Yes, you will be able to eat a regular diet or eat as tolerated. You should start with foods that are soft and easy to digest such as apple sauce and chicken noodle soup. Eat small meals frequently, and then advance to regular foods. If you experience bloating, gas,  or cramps, limit high-fiber foods, including whole grain breads and cereal, nuts, seeds, salads, fresh fruit, broccoli, cabbage, and cauliflower. Will I have pain when I am home? The length of time each person has pain or discomfort varies. You may still have some pain when you go home and will probably be taking pain medication. Follow the guidelines below. . Take your medications as directed and as needed. . Call your doctor if the medication prescribed for you doesn't relieve your pain. . Don't drive or drink alcohol while you're taking prescription pain medication. . As your incision heals, you will have less pain and need less pain medication. A mild pain reliever such as acetaminophen (Tylenol) or ibuprofen (Advil) will relieve aches and discomfort. However, large quantities of acetaminophen may be harmful to your liver. Don't take more acetaminophen than the amount directed on the bottle or as instructed by your doctor or nurse. . Pain medication should help you as you resume your normal activities. Take enough medication to do your exercises comfortably. Pain medication is most effective 30 to 45 minutes after taking it. [name] Keep track of when you take your pain medication. Taking it when your pain first begins is more effective than waiting for the pain to get worse. Pain medication may cause constipation (having fewer bowel movements than what is normal for you).  How can I prevent constipation? . Go to the bathroom at the same time every day. Your body will get used to going at that time. . If you feel the urge to go, don't put it off. Try to use the bathroom 5 to 15 minutes after meals. . After breakfast is a Jossette Zirbel time to move your bowels. The reflexes in your colon are strongest at this time. . Exercise, if you can. Walking is an excellent form of exercise. . Drink 8 (8-ounce) glasses (2 liters) of liquids daily, if you can. Drink water, juices, soups, ice cream shakes, and other drinks  that don't have caffeine. Drinks with caffeine, such as coffee and soda, pull fluid out of the body. . Slowly increase the fiber in your diet to 25 to 35 grams per day. Fruits, vegetables, whole grains, and cereals contain fiber. If you have an ostomy or have had recent bowel surgery, check with your doctor or nurse before making any changes in your diet. . Both over-the-counter and prescription medications are available to treat constipation. Start with 1 of the following over-the-counter medications first: o Docusate sodium (Colace) 100 mg. Take ___1__ capsules _2____ times a day. This is a stool softener that causes few side effects. Don't take it with mineral oil. o Polyethylene glycol (MiraLAX) 17 grams daily. o Senna (Senokot) 2 tablets at bedtime. This is a stimulant laxative, which can cause cramping. . If you haven't had a bowel movement in 2  days, call your doctor or nurse.  Can I shower? Yes, you should shower 24 hours after your surgery. Be sure to shower every day. Taking a warm shower is relaxing and can help decrease muscle aches. Use soap when you shower and gently wash your incision. Pat the areas dry with a towel after showering, and leave your incision uncovered (unless there is drainage). Call your doctor if you see any redness or drainage from your incision. Don't take tub baths until you discuss it with your doctor at the first appointment after your surgery. How do I care for my incisions? You will have several small incisions on your abdomen. The incisions are closed with Steri-Strips or Dermabond. You may also have square white dressings on your incisions ([place]). You can remove these in the shower 24 hours after your surgery. You should clean your incisions with soap and water. If you go home with Steri-Strips on your incision, they will loosen and may fall off by themselves. If they haven't fallen off within 10 days, you can remove them. If you go home with Dermabond  over your sutures (stitches), it will also loosen and peel off.  What are the most common symptoms after a hysterectomy? It's common for you to have some vaginal spotting or light bleeding. You should monitor this with a pad or a panty liner. If you have having heavy bleeding (bleeding through a pad or liner every 1 to 2 hours), call your doctor right away. It's also common to have some discomfort after surgery from the air that was pumped into your abdomen during surgery. To help with this, walk, drink plenty of liquids and make sure to take the stool softeners you received.  When is it safe for me to drive? You may resume driving 2 weeks after surgery, as long as you are not taking pain medication that may make you drowsy.  When can I resume sexual activity? Do not place anything in your vagina or have vaginal intercourse for 8 weeks after your surgery. Some people will need to wait longer than 8 weeks, so speak with your doctor before resuming sexual intercourse.  Will I be able to travel? Yes, you can travel. If you are traveling by plane within a few weeks after your surgery, make sure you get up and walk every hour. Be sure to stretch your legs, drink plenty of liquids, and keep your feet elevated when possible.  Will I need any supplies? Most people do not need any supplies after the surgery. In the rare case that you do need supplies, such as tubes or drains, your nurse will order them for you.  When can I return to work? The time it takes to return to work depends on the type of work you do, the type of surgery you had, and how fast your body heals. Most people can return to work about 2 to 4 weeks after the surgery.  What exercises can I do? Exercise will help you gain strength and feel better. Walking and stair climbing are excellent forms of exercise. Gradually increase the distance you walk. Climb stairs slowly, resting or stopping as needed. Ask your doctor or nurse before  starting more strenuous exercises.  When can I lift heavy objects? Most people should not lift anything heavier than 10 pounds (4.5 kilograms) for at least 4 weeks after surgery. Speak with your doctor about when you can do heavy lifting.  How can I cope with my feelings? After surgery  for a serious illness, you may have new and upsetting feelings. Many people say they felt weepy, sad, worried, nervous, irritable, and angry at one time or another. You may find that you can't control some of these feelings. If this happens, it's a Raissa Dam idea to seek emotional support. The first step in coping is to talk about how you feel. Family and friends can help. Your nurse, doctor, and social worker can reassure, support, and guide you. It's always a Marsena Taff idea to let these professionals know how you, your family, and your friends are feeling emotionally. Many resources are available to patients and their families. Whether you're in the hospital or at home, the nurses, doctors, and social workers are here to help you and your family and friends handle the emotional aspects of your illness.  When is my first appointment after surgery? Your first appointment after surgery will be 2 to 4 weeks after surgery. Your nurse will give you instructions on how to make this appointment, including the phone number to call.  What if I have other questions? If you have any questions or concerns, please talk with your doctor or nurse. You can reach them Monday through Friday from 9:00 am to 5:00 pm. After 5:00 pm, during the weekend, and on holidays, call [phone] and ask for the doctor on call for your doctor.  . Have a temperature of 101 F (38.3 C) or higher . Have pain that does not get better with pain medication . Have redness, drainage, or swelling from your incisions  HOLD ELIQUIS FOR 48 HOURS POSTOPERATIVELY     General Anesthesia, Adult, Care After This sheet gives you information about how to care for  yourself after your procedure. Your health care provider may also give you more specific instructions. If you have problems or questions, contact your health care provider. What can I expect after the procedure? After the procedure, the following side effects are common:  Pain or discomfort at the IV site.  Nausea.  Vomiting.  Sore throat.  Trouble concentrating.  Feeling cold or chills.  Weak or tired.  Sleepiness and fatigue.  Soreness and body aches. These side effects can affect parts of the body that were not involved in surgery. Follow these instructions at home:  For at least 24 hours after the procedure:  Have a responsible adult stay with you. It is important to have someone help care for you until you are awake and alert.  Rest as needed.  Do not: ? Participate in activities in which you could fall or become injured. ? Drive. ? Use heavy machinery. ? Drink alcohol. ? Take sleeping pills or medicines that cause drowsiness. ? Make important decisions or sign legal documents. ? Take care of children on your own. Eating and drinking  Follow any instructions from your health care provider about eating or drinking restrictions.  When you feel hungry, start by eating small amounts of foods that are soft and easy to digest (bland), such as toast. Gradually return to your regular diet.  Drink enough fluid to keep your urine pale yellow.  If you vomit, rehydrate by drinking water, juice, or clear broth. General instructions  If you have sleep apnea, surgery and certain medicines can increase your risk for breathing problems. Follow instructions from your health care provider about wearing your sleep device: ? Anytime you are sleeping, including during daytime naps. ? While taking prescription pain medicines, sleeping medicines, or medicines that make you drowsy.  Return  to your normal activities as told by your health care provider. Ask your health care provider  what activities are safe for you.  Take over-the-counter and prescription medicines only as told by your health care provider.  If you smoke, do not smoke without supervision.  Keep all follow-up visits as told by your health care provider. This is important. Contact a health care provider if:  You have nausea or vomiting that does not get better with medicine.  You cannot eat or drink without vomiting.  You have pain that does not get better with medicine.  You are unable to pass urine.  You develop a skin rash.  You have a fever.  You have redness around your IV site that gets worse. Get help right away if:  You have difficulty breathing.  You have chest pain.  You have blood in your urine or stool, or you vomit blood. Summary  After the procedure, it is common to have a sore throat or nausea. It is also common to feel tired.  Have a responsible adult stay with you for the first 24 hours after general anesthesia. It is important to have someone help care for you until you are awake and alert.  When you feel hungry, start by eating small amounts of foods that are soft and easy to digest (bland), such as toast. Gradually return to your regular diet.  Drink enough fluid to keep your urine pale yellow.  Return to your normal activities as told by your health care provider. Ask your health care provider what activities are safe for you. This information is not intended to replace advice given to you by your health care provider. Make sure you discuss any questions you have with your health care provider. Document Revised: 03/25/2017 Document Reviewed: 11/05/2016 Elsevier Patient Education  [place].

## 2019-09-12 ENCOUNTER — Encounter: Payer: Self-pay | Admitting: *Deleted

## 2019-09-12 ENCOUNTER — Telehealth: Payer: Self-pay

## 2019-09-12 NOTE — Telephone Encounter (Signed)
Spoke with daughter Caren Griffins. Ms Laurence Ferrari is doing well. She is eating and drinking well.  She needs to increase fluid intake to 64 oz in 24 hrs. She has some burning with urination. Pt to finish the  Nitrofurantoin today for pre-op UTI.  Told daughter to call Friday 09-14-19 if  she has any urinary symptoms as she may need a repeat urinalysis. She is passing gas.  Will begin 2 Senokot-S today bid. Pain level is 3/10 with tylenol. Pt to resume the Eliquis on Thursday evening per Dr. Berline Lopes. Incisions are D&I. Afebrile. She is having some light post op vaginal spotting. Daughter aware of mother's appointments as well as the office number [phone] if she has any questions or concerns.

## 2019-09-14 LAB — SURGICAL PATHOLOGY

## 2019-09-17 ENCOUNTER — Encounter: Payer: Self-pay | Admitting: Gynecologic Oncology

## 2019-09-17 ENCOUNTER — Inpatient Hospital Stay (HOSPITAL_BASED_OUTPATIENT_CLINIC_OR_DEPARTMENT_OTHER): Payer: Medicare Other | Admitting: Gynecologic Oncology

## 2019-09-17 DIAGNOSIS — Z90710 Acquired absence of both cervix and uterus: Secondary | ICD-10-CM

## 2019-09-17 DIAGNOSIS — C541 Malignant neoplasm of endometrium: Secondary | ICD-10-CM

## 2019-09-17 DIAGNOSIS — Z90722 Acquired absence of ovaries, bilateral: Secondary | ICD-10-CM

## 2019-09-17 NOTE — Progress Notes (Signed)
Gynecologic Oncology Telehealth Consult Note: Gyn-Onc  I connected with Shirley [name] on 09/17/19 at  8:30 AM EDT by telephone and verified that I am speaking with the correct person using two identifiers.  I discussed the limitations, risks, security and privacy concerns of performing an evaluation and management service by telemedicine and the availability of in-person appointments. I also discussed with the patient that there may be a patient responsible charge related to this service. The patient expressed understanding and agreed to proceed.  Other persons participating in the visit and their role in the encounter: Caren Griffins, patient's daughter.  Patient's location: home Provider's location: hospital  Reason for Visit: surgery follow-up  Treatment History: Oncology History  Endometrial cancer ([place])  02/26/2019 Initial Biopsy   Endometrial biopsy revealed grade 1 endometrioid adenocarcinoma   02/26/2019 Initial Diagnosis   Endometrial cancer ([place])   03/27/2019 Imaging   CT abdomen and pelvis: Diffuse endometrial thickening, consistent with known endometrial carcinoma.  No evidence of metastatic disease within the abdomen or pelvis.  Several small uterine fibroids largest measuring 4 cm.   03/28/2019 Surgery   D&C: Pathology shows endometrioid carcinoma, FIGO grade 1-2   03/28/2019 Treatment Plan Change   Mirena IUD inserted   07/02/2019 Pathology Results   EMB: focal Gr1 endometrioid adenocarcinoma Stromal changes c/w hormone effect   09/11/2019 Surgery   TRH/BSO, SLN  On EUA, 8-10cm uterus, mobile. On intra-abdominal entry, normal upper abdominal survey. Normal appearing small and large bowel, omentum. Normal appearing bilateral adnexa. Uterus and bulbous, especially at the fundus, with several 1-2cm intra-mural fibroids. Mapping successful to bilateral obturator nodes. IUD noted within the uterus after specimen removal. NO intra-abdominal or pelvic evidence of disease.    09/11/2019 Pathology Results   IA, grade 1 endometrioid, no MI, negative SLNs, no LVSI   09/11/2019 Cancer Staging   Staging form: Corpus Uteri - Carcinoma and Carcinosarcoma, AJCC 8th Edition - Clinical stage from 09/11/2019: FIGO Stage IA (cT1a, cN0, cM0) - Signed by Lafonda Mosses, MD on 09/17/2019     Interval History: Patient is doing well since surgery. Notes some discomfort intermittently in her upper abdomen around her incisions, for which she takes Tylenol prn. Reports eating and drinking without difficulty. Reports normal bowel and bladder function. Denies fevers, chills, vaginal bleeding or discharge.   Past Medical/Surgical History: Past Medical History:  Diagnosis Date  . BMI 40.0-44.9, adult ([place])   . CAD in native artery, stent to RCA BMS with MI 2015 11/04/2003   a. inferior STEMI 01/2014 s/p BMS to prox RCA.  [name] DVT (deep venous thrombosis) ([place]) 2003   lower extremity DVT with questionable recurrence in 2005. Treated with 1.5 year course of coumadin. Another admission 2015.  . Endometrial cancer ([place])   . Endometrial thickening on ultra sound 07/2009   path showing Fragments of benign endocervical and squamous mucosa. No dysplasia or malignancy // Followed by Dr. [name]  . First degree AV block   . Gastric ulcer   . GERD (gastroesophageal reflux disease)   . GI bleed   . Hemorrhoid   . Hyperlipidemia LDL goal <70   . Hypertension   . Insomnia 11/02/2010  . Memory difficulties 11/02/2010  . Mild dilation of ascending aorta ([facility])   . Morbid obesity ([place]) 09/16/2012  . Obesity   . Obstructive sleep apnea 09/23/2010  . Pre-diabetes   . Prediabetes DX: 09/2010   HgA1c 6.1  . Pulmonary nodule   . ST elevation myocardial infarction (STEMI)  of inferior wall ([place]) 01/24/2014  . Stroke ([place]) 2015  . Syncope 12/18/2013  . Syncope, near 12/14/2018  . TIA (transient ischemic attack) 09/23/2010   09/13/2010: CT, MRI/MRA no acute process. Carotid doppler: no stenosis.  2D-Echo Normal LV function with an EF 55-60%.   [name] UTI (urinary tract infection) 01/27/2014    Past Surgical History:  Procedure Laterality Date  . COLONOSCOPY W/ POLYPECTOMY  11/01/2007   8 mm rectal adenoma, hemorrhoids  . CORONARY ANGIOPLASTY WITH STENT PLACEMENT  01/24/14   Inf. STEMI, BMS to RCA  . DILATION AND CURETTAGE OF UTERUS N/A 03/28/2019   Procedure: DILATATION AND CURETTAGE OF UTERUS;  Surgeon: Lafonda Mosses, MD;  Location: [facility];  Service: Gynecology;  Laterality: N/A;  . ESOPHAGOGASTRODUODENOSCOPY N/A 10/06/2018   Procedure: ESOPHAGOGASTRODUODENOSCOPY (EGD);  Surgeon: Doran Stabler, MD;  Location: [place];  Service: Gastroenterology;  Laterality: N/A;  . HOT HEMOSTASIS N/A 10/06/2018   Procedure: HOT HEMOSTASIS (ARGON PLASMA COAGULATION/BICAP);  Surgeon: Doran Stabler, MD;  Location: [facility];  Service: Gastroenterology;  Laterality: N/A;  . INTRAUTERINE DEVICE (IUD) INSERTION N/A 03/28/2019   Procedure: INTRAUTERINE DEVICE (IUD) INSERTION;  Surgeon: Lafonda Mosses, MD;  Location: [facility];  Service: Gynecology;  Laterality: N/A;  . KNEE SURGERY  1996   Left knee arthroscopy.  [name] LEFT HEART CATHETERIZATION WITH CORONARY ANGIOGRAM N/A 01/24/2014   Procedure: LEFT HEART CATHETERIZATION WITH CORONARY ANGIOGRAM;  Surgeon: Burnell Blanks, MD;  Location[facility] CATH LAB;  Service: Cardiovascular;  Laterality: N/A;  . ROBOTIC ASSISTED TOTAL HYSTERECTOMY WITH BILATERAL SALPINGO OOPHERECTOMY N/A 09/11/2019   Procedure: XI ROBOTIC ASSISTED TOTAL HYSTERECTOMY WITH BILATERAL SALPINGO OOPHORECTOMY, LYMPH NODE DISSECTION;  Surgeon: Lafonda Mosses, MD;  Location: [facility];  Service: Gynecology;  Laterality: N/A;  . ROTATOR CUFF REPAIR     right.  [name]  10/06/2018   Procedure: [name];  Surgeon: Doran Stabler, MD;  Location[facility] ENDOSCOPY;  Service: Gastroenterology;;  . [name] NODE BIOPSY N/A 09/11/2019   Procedure: SENTINEL NODE BIOPSY;   Surgeon: Lafonda Mosses, MD;  Location: [facility];  Service: Gynecology;  Laterality: N/A;  . TUBAL LIGATION      Family History  Problem Relation Age of Onset  . Diabetes Mother   . Hyperlipidemia Mother   . Heart disease Mother   . Dementia Mother   . Angina Father   . Heart attack Father   . Thyroid cancer Sister   . Liver cancer Sister   . Heart disease Brother        x 2 in 39s yo  . Alzheimer's disease Maternal Grandmother   . Stroke Neg Hx   . Colon cancer Neg Hx   . Esophageal cancer Neg Hx   . Rectal cancer Neg Hx   . Stomach cancer Neg Hx   . Ovarian cancer Neg Hx   . Endometrial cancer Neg Hx     Social History   Socioeconomic History  . Marital status: Married    Spouse name: Not on file  . Number of children: 6  . Years of education: College  . Highest education level: Not on file  Occupational History  . Occupation: Control and instrumentation engineer  . Occupation: TEACHER- [employer]    Employer: [employer]  Tobacco Use  . Smoking status: Never Smoker  . Smokeless tobacco: Never Used  Vaping Use  . Vaping Use: Never used  Substance and Sexual Activity  . Alcohol use: No  .  Drug use: No  . Sexual activity: Not on file  Other Topics Concern  . Not on file  Social History Narrative   Full Code status.   Insurance: BCBS   Social Determinants of Health   Financial Resource Strain:   . Difficulty of Paying Living Expenses:   Food Insecurity:   . Worried About Running Out of Food in the Last Year:   . Ran Out of Food in the Last Year:   Transportation Needs:   . Lack of Transportation (Medical):   . Lack of Transportation (Non-Medical):   Physical Activity:   . Days of Exercise per Week:   . Minutes of Exercise per Session:   Stress:   . Feeling of Stress :   Social Connections:   . Frequency of Communication with Friends and Family:   . Frequency of Social Gatherings with Friends and Family:   . Attends Religious Services:   .  Active Member of Clubs or Organizations:   . Attends Club or Organization Meetings:   . Marital Status:     Current Medications:  Current Outpatient Medications:  .  apixaban (ELIQUIS) 2.5 MG TABS tablet, Take 1 tablet (2.5 mg total) by mouth 2 (two) times daily., Disp: 60 tablet, Rfl: 5 .  atorvastatin (LIPITOR) 80 MG tablet, Take 1 tablet (80 mg total) by mouth daily. (Patient taking differently: Take 80 mg by mouth daily at 6 PM. ), Disp: 90 tablet, Rfl: 3 .  cholecalciferol (VITAMIN D3) 25 MCG (1000 UNIT) tablet, Take 1,000 Units by mouth daily., Disp: , Rfl:  .  ferrous gluconate (IRON 27) 240 (27 FE) MG tablet, Take 240 mg by mouth daily with lunch., Disp: , Rfl:  .  ibuprofen (ADVIL) 200 MG tablet, Take 200 mg by mouth every 6 (six) hours as needed for headache or moderate pain., Disp: , Rfl:  .  lisinopril (ZESTRIL) 40 MG tablet, Take 40 mg by mouth daily., Disp: , Rfl:  .  metoprolol succinate (TOPROL-XL) 25 MG 24 hr tablet, Take 1 tablet (25 mg total) by mouth daily., Disp: 90 tablet, Rfl: 3 .  Multiple Vitamin (MULTIVITAMIN WITH MINERALS) TABS, Take 1 tablet by mouth every morning., Disp: , Rfl:  .  Naphazoline HCl (CLEAR EYES OP), Place 1 drop into both eyes daily as needed (dry eyes)., Disp: , Rfl:  .  nitrofurantoin, macrocrystal-monohydrate, (MACROBID) 100 MG capsule, Take 1 capsule (100 mg total) by mouth 2 (two) times daily., Disp: 10 capsule, Rfl: 0 .  nitroGLYCERIN (NITROSTAT) 0.4 MG SL tablet, Place 1 tablet (0.4 mg total) under the tongue every 5 (five) minutes x 3 doses as needed for chest pain., Disp: 25 tablet, Rfl: 3 .  pantoprazole (PROTONIX) 40 MG tablet, TAKE 1 TABLET BY MOUTH EVERY DAY, Disp: 90 tablet, Rfl: 0 .  senna-docusate (SENOKOT-S) 8.6-50 MG tablet, Take 2 tablets by mouth at bedtime. For AFTER surgery, do not take if having diarrhea, Disp: 30 tablet, Rfl: 0 .  sodium chloride (OCEAN) 0.65 % SOLN nasal spray, Place 1 spray into both nostrils as needed for  congestion., Disp: , Rfl:  .  traMADol (ULTRAM) 50 MG tablet, Take 1 tablet (50 mg total) by mouth every 6 (six) hours as needed. For AFTER surgery, do not take and drive, Disp: 10 tablet, Rfl: 0  Review of Symptoms: Pertinent positives as per HPI, otherwise negative.   Physical Exam: There were no vitals taken for this visit. Not performed given limitations of visit.   Laboratory &   Radiologic Studies: A. LYMPH NODE, SENTINEL, RIGHT OBTURATOR, EXCISION:  - One lymph node with no metastatic carcinoma (0/1).   B. LYMPH NODE, SENTINEL, LEFT OBTURATOR, EXCISION:  - One lymph node with no metastatic carcinoma (0/1).   C. UTERUS, CERVIX, BILATERAL TUBES AND OVARIES, RESECTION:  - Endometrium:    Endometrioid adenocarcinoma, FIGO grade 1.    No myometrial invasion.    Cervix, bilateral ovaries and bilateral fallopian tubes free of  tumor.  - Myometrium:    Leiomyomata.  - Bilateral ovaries:    Unremarkable.    No endometriosis or malignancy.  - Right Fallopian tube:    Tubal ligation.    No endometriosis or malignancy.  - Left Fallopian tube:    Tubal ligation and hydrosalpinx.   No endometriosis or malignancy.   ONCOLOGY TABLE:  UTERUS, CARCINOMA OR CARCINOSARCOMA  Procedure: Total hysterectomy and bilateral salpingo-oophorectomy.  Histologic type: Endometrioid.  Histologic Grade: FIGO grade 1.  Myometrial invasion:    Depth of invasion: 0 mm    Myometrial thickness: 30 mm  Uterine Serosa Involvement: Not identified.  Cervical stromal involvement: Not identified.  Extent of involvement of other organs: Not identified.  Lymphovascular invasion: Not identified.  Regional Lymph Nodes:    Examined:   2 Sentinel                0 non-sentinel                2 total     Lymph nodes with metastasis: 0     Isolated tumor cells (<0.2 mm): 0     Micrometastasis: (>0.2 mm and < 2.0 mm): 0      Macrometastasis: (>2.0 mm): 0  Representative Tumor Block: C4, C6 and C7.  MMR / MSI testing: Pending.  Pathologic Stage Classification (pTNM, AJCC 8th edition): pT1a, pN0  Comments: Cytokeratin AE1/AE3 immunohistochemistry performed on the  sentinel lymph nodes (parts A and B) is negative.  (v4.1.0.2)   Assessment & Plan: Avalin D [name] is a 79 y.o. woman with Stage IA grade 1 low-risk endometrial cancer who presents for follow-up.  Meeting post-operative milestones. Discussed continued activity restrictions. Reviewed pathology from surgery in details. Given low risk disease, no recommendation for adjuvant therapy. All of patient's and her daughter's questions were answered. Patient aware of in person follow-up at the end of the month.  I discussed the assessment and treatment plan with the patient. The patient was provided with an opportunity to ask questions and all were answered. The patient agreed with the plan and demonstrated an understanding of the instructions.   The patient was advised to call back or see an in-person evaluation if the symptoms worsen or if the condition fails to improve as anticipated.   20 minutes of total time was spent for this patient encounter, including preparation, face-to-face counseling with the patient and coordination of care, and documentation of the encounter.   Katherine Tucker, MD  Division of Gynecologic Oncology  Department of Obstetrics and Gynecology  [facility]   

## 2019-09-18 ENCOUNTER — Ambulatory Visit: Payer: Medicare Other | Admitting: Gynecologic Oncology

## 2019-10-01 ENCOUNTER — Inpatient Hospital Stay (HOSPITAL_BASED_OUTPATIENT_CLINIC_OR_DEPARTMENT_OTHER): Payer: Medicare Other | Admitting: Gynecologic Oncology

## 2019-10-01 ENCOUNTER — Encounter: Payer: Self-pay | Admitting: Gynecologic Oncology

## 2019-10-01 ENCOUNTER — Other Ambulatory Visit: Payer: Self-pay

## 2019-10-01 VITALS — BP 149/78 | HR 63 | Temp 98.5°F | Resp 18 | Ht 63.0 in | Wt 226.4 lb

## 2019-10-01 DIAGNOSIS — I252 Old myocardial infarction: Secondary | ICD-10-CM | POA: Diagnosis not present

## 2019-10-01 DIAGNOSIS — I119 Hypertensive heart disease without heart failure: Secondary | ICD-10-CM | POA: Diagnosis not present

## 2019-10-01 DIAGNOSIS — R609 Edema, unspecified: Secondary | ICD-10-CM | POA: Diagnosis not present

## 2019-10-01 DIAGNOSIS — Z90722 Acquired absence of ovaries, bilateral: Secondary | ICD-10-CM

## 2019-10-01 DIAGNOSIS — I82409 Acute embolism and thrombosis of unspecified deep veins of unspecified lower extremity: Secondary | ICD-10-CM | POA: Diagnosis not present

## 2019-10-01 DIAGNOSIS — Z7982 Long term (current) use of aspirin: Secondary | ICD-10-CM | POA: Diagnosis not present

## 2019-10-01 DIAGNOSIS — Z79899 Other long term (current) drug therapy: Secondary | ICD-10-CM | POA: Diagnosis not present

## 2019-10-01 DIAGNOSIS — I2510 Atherosclerotic heart disease of native coronary artery without angina pectoris: Secondary | ICD-10-CM | POA: Diagnosis not present

## 2019-10-01 DIAGNOSIS — Z90710 Acquired absence of both cervix and uterus: Secondary | ICD-10-CM

## 2019-10-01 DIAGNOSIS — C541 Malignant neoplasm of endometrium: Secondary | ICD-10-CM

## 2019-10-01 DIAGNOSIS — Z7901 Long term (current) use of anticoagulants: Secondary | ICD-10-CM | POA: Diagnosis not present

## 2019-10-01 NOTE — Patient Instructions (Signed)
Overall you are healing well from surgery.  Remember your restrictions include no lifting more than 10 pounds for 6 weeks, and nothing in the vagina for 8 weeks after surgery.  I would also like you not to take baths or soak in water until you are 8 weeks out from surgery.  I will see you in 6 months for a follow-up visit.  If you develop any signs or symptoms as we talked about (vaginal bleeding, discharge, abdominal pain, weight loss) prior to your next visit, please call the clinic to be seen sooner at [phone].  Please be in touch in the next couple of months if you are having trouble with exercise and weight loss on your own.  I am happy to put in a referral to see one of our nutritionist.

## 2019-10-01 NOTE — Progress Notes (Signed)
Gynecologic Oncology Return Clinic Visit  10/01/19  Reason for Visit: follow-up post-op and treatment discussion  Treatment History: Oncology History Overview Note  IHC MMR intact   Endometrial cancer ([place])  02/26/2019 Initial Biopsy   Endometrial biopsy revealed grade 1 endometrioid adenocarcinoma   02/26/2019 Initial Diagnosis   Endometrial cancer ([place])   03/27/2019 Imaging   CT abdomen and pelvis: Diffuse endometrial thickening, consistent with known endometrial carcinoma.  No evidence of metastatic disease within the abdomen or pelvis.  Several small uterine fibroids largest measuring 4 cm.   03/28/2019 Surgery   D&C: Pathology shows endometrioid carcinoma, FIGO grade 1-2   03/28/2019 Treatment Plan Change   Mirena IUD inserted   07/02/2019 Pathology Results   EMB: focal Gr1 endometrioid adenocarcinoma Stromal changes c/w hormone effect   09/11/2019 Surgery   TRH/BSO, SLN  On EUA, 8-10cm uterus, mobile. On intra-abdominal entry, normal upper abdominal survey. Normal appearing small and large bowel, omentum. Normal appearing bilateral adnexa. Uterus and bulbous, especially at the fundus, with several 1-2cm intra-mural fibroids. Mapping successful to bilateral obturator nodes. IUD noted within the uterus after specimen removal. NO intra-abdominal or pelvic evidence of disease.   09/11/2019 Pathology Results   IA, grade 1 endometrioid, no MI, negative SLNs, no LVSI   09/11/2019 Cancer Staging   Staging form: Corpus Uteri - Carcinoma and Carcinosarcoma, AJCC 8th Edition - Clinical stage from 09/11/2019: FIGO Stage IA (cT1a, cN0, cM0) - Signed by Lafonda Mosses, MD on 09/17/2019     Interval History: Overall, the patient reports doing well since surgery.  She denies any vaginal bleeding or discharge.  She denies fevers or chills.  She is back to her baseline constipation and not taking anything to help with bowel movements.  She took Senokot for 2 days after surgery until her  bowel function returned.  She was initially having some difficulty controlling her stream and notes that now her voiding has improved some.  She denies any leaking between voids.  She denies any nausea or emesis and is tolerating p.o. intake without difficulty.  She is ambulating frequently.  She notes some left upper discomfort around the incision and left groin tenderness.  She is taking Tylenol 3 times a day which relieves her pain.  Past Medical/Surgical History: Past Medical History:  Diagnosis Date  . BMI 40.0-44.9, adult ([place])   . CAD in native artery, stent to RCA BMS with MI 2015 11/04/2003   a. inferior STEMI 01/2014 s/p BMS to prox RCA.  [name] DVT (deep venous thrombosis) ([place]) 2003   lower extremity DVT with questionable recurrence in 2005. Treated with 1.5 year course of coumadin. Another admission 2015.  . Endometrial cancer ([place])   . Endometrial thickening on ultra sound 07/2009   path showing Fragments of benign endocervical and squamous mucosa. No dysplasia or malignancy // Followed by Dr. [name]  . First degree AV block   . Gastric ulcer   . GERD (gastroesophageal reflux disease)   . GI bleed   . Hemorrhoid   . Hyperlipidemia LDL goal <70   . Hypertension   . Insomnia 11/02/2010  . Memory difficulties 11/02/2010  . Mild dilation of ascending aorta ([facility])   . Morbid obesity ([place]) 09/16/2012  . Obesity   . Obstructive sleep apnea 09/23/2010  . Pre-diabetes   . Prediabetes DX: 09/2010   HgA1c 6.1  . Pulmonary nodule   . ST elevation myocardial infarction (STEMI) of inferior wall ([place]) 01/24/2014  . Stroke ([place]) 2015  .  Syncope 12/18/2013  . Syncope, near 12/14/2018  . TIA (transient ischemic attack) 09/23/2010   09/13/2010: CT, MRI/MRA no acute process. Carotid doppler: no stenosis. 2D-Echo Normal LV function with an EF 55-60%.   [name] UTI (urinary tract infection) 01/27/2014    Past Surgical History:  Procedure Laterality Date  . COLONOSCOPY W/ POLYPECTOMY  11/01/2007    8 mm rectal adenoma, hemorrhoids  . CORONARY ANGIOPLASTY WITH STENT PLACEMENT  01/24/14   Inf. STEMI, BMS to RCA  . DILATION AND CURETTAGE OF UTERUS N/A 03/28/2019   Procedure: DILATATION AND CURETTAGE OF UTERUS;  Surgeon: Lafonda Mosses, MD;  Location: [facility];  Service: Gynecology;  Laterality: N/A;  . ESOPHAGOGASTRODUODENOSCOPY N/A 10/06/2018   Procedure: ESOPHAGOGASTRODUODENOSCOPY (EGD);  Surgeon: Doran Stabler, MD;  Location: [facility];  Service: Gastroenterology;  Laterality: N/A;  . HOT HEMOSTASIS N/A 10/06/2018   Procedure: HOT HEMOSTASIS (ARGON PLASMA COAGULATION/BICAP);  Surgeon: Doran Stabler, MD;  Location: [facility];  Service: Gastroenterology;  Laterality: N/A;  . INTRAUTERINE DEVICE (IUD) INSERTION N/A 03/28/2019   Procedure: INTRAUTERINE DEVICE (IUD) INSERTION;  Surgeon: Lafonda Mosses, MD;  Location: [facility];  Service: Gynecology;  Laterality: N/A;  . KNEE SURGERY  1996   Left knee arthroscopy.  [name] LEFT HEART CATHETERIZATION WITH CORONARY ANGIOGRAM N/A 01/24/2014   Procedure: LEFT HEART CATHETERIZATION WITH CORONARY ANGIOGRAM;  Surgeon: Burnell Blanks, MD;  Location: [facility] CATH LAB;  Service: Cardiovascular;  Laterality: N/A;  . ROBOTIC ASSISTED TOTAL HYSTERECTOMY WITH BILATERAL SALPINGO OOPHERECTOMY N/A 09/11/2019   Procedure: XI ROBOTIC ASSISTED TOTAL HYSTERECTOMY WITH BILATERAL SALPINGO OOPHORECTOMY, LYMPH NODE DISSECTION;  Surgeon: Lafonda Mosses, MD;  Location: [facility];  Service: Gynecology;  Laterality: N/A;  . ROTATOR CUFF REPAIR     right.  [name]  10/06/2018   Procedure: [name];  Surgeon: Doran Stabler, MD;  Location: [facility] ENDOSCOPY;  Service: Gastroenterology;;  . [name] NODE BIOPSY N/A 09/11/2019   Procedure: SENTINEL NODE BIOPSY;  Surgeon: Lafonda Mosses, MD;  Location: [facility];  Service: Gynecology;  Laterality: N/A;  . TUBAL LIGATION      Family History  Problem Relation Age of Onset  . Diabetes Mother   .  Hyperlipidemia Mother   . Heart disease Mother   . Dementia Mother   . Angina Father   . Heart attack Father   . Thyroid cancer Sister   . Liver cancer Sister   . Heart disease Brother        x 2 in 22s yo  . Alzheimer's disease Maternal Grandmother   . Stroke Neg Hx   . Colon cancer Neg Hx   . Esophageal cancer Neg Hx   . Rectal cancer Neg Hx   . Stomach cancer Neg Hx   . Ovarian cancer Neg Hx   . Endometrial cancer Neg Hx     Social History   Socioeconomic History  . Marital status: Married    Spouse name: Not on file  . Number of children: 6  . Years of education: College  . Highest education level: Not on file  Occupational History  . Occupation: Control and instrumentation engineer  . Occupation: TEACHER- [employer]    Employer: [employer]  Tobacco Use  . Smoking status: Never Smoker  . Smokeless tobacco: Never Used  Vaping Use  . Vaping Use: Never used  Substance and Sexual Activity  . Alcohol use: No  . Drug use: No  . Sexual activity: Not on file  Other Topics Concern  . Not on file  Social History Narrative   Full Code status.   Insurance: BCBS   Social Determinants of Health   Financial Resource Strain:   . Difficulty of Paying Living Expenses:   Food Insecurity:   . Worried About Charity fundraiser in the Last Year:   . Arboriculturist in the Last Year:   Transportation Needs:   . Film/video editor (Medical):   [name] Lack of Transportation (Non-Medical):   Physical Activity:   . Days of Exercise per Week:   . Minutes of Exercise per Session:   Stress:   . Feeling of Stress :   Social Connections:   . Frequency of Communication with Friends and Family:   . Frequency of Social Gatherings with Friends and Family:   . Attends Religious Services:   . Active Member of Clubs or Organizations:   . Attends Archivist Meetings:   [name] Marital Status:     Current Medications:  Current Outpatient Medications:  .  apixaban (ELIQUIS) 2.5  MG TABS tablet, Take 1 tablet (2.5 mg total) by mouth 2 (two) times daily., Disp: 60 tablet, Rfl: 5 .  atorvastatin (LIPITOR) 80 MG tablet, Take 1 tablet (80 mg total) by mouth daily. (Patient taking differently: Take 80 mg by mouth daily at 6 PM. ), Disp: 90 tablet, Rfl: 3 .  cholecalciferol (VITAMIN D3) 25 MCG (1000 UNIT) tablet, Take 1,000 Units by mouth daily., Disp: , Rfl:  .  ferrous gluconate (IRON 27) 240 (27 FE) MG tablet, Take 240 mg by mouth daily with lunch., Disp: , Rfl:  .  ibuprofen (ADVIL) 200 MG tablet, Take 200 mg by mouth every 6 (six) hours as needed for headache or moderate pain., Disp: , Rfl:  .  lisinopril (ZESTRIL) 40 MG tablet, Take 40 mg by mouth daily., Disp: , Rfl:  .  metoprolol succinate (TOPROL-XL) 25 MG 24 hr tablet, Take 1 tablet (25 mg total) by mouth daily., Disp: 90 tablet, Rfl: 3 .  Multiple Vitamin (MULTIVITAMIN WITH MINERALS) TABS, Take 1 tablet by mouth every morning., Disp: , Rfl:  .  Naphazoline HCl (CLEAR EYES OP), Place 1 drop into both eyes daily as needed (dry eyes)., Disp: , Rfl:  .  nitrofurantoin, macrocrystal-monohydrate, (MACROBID) 100 MG capsule, Take 1 capsule (100 mg total) by mouth 2 (two) times daily., Disp: 10 capsule, Rfl: 0 .  nitroGLYCERIN (NITROSTAT) 0.4 MG SL tablet, Place 1 tablet (0.4 mg total) under the tongue every 5 (five) minutes x 3 doses as needed for chest pain., Disp: 25 tablet, Rfl: 3 .  pantoprazole (PROTONIX) 40 MG tablet, TAKE 1 TABLET BY MOUTH EVERY DAY, Disp: 90 tablet, Rfl: 0 .  senna-docusate (SENOKOT-S) 8.6-50 MG tablet, Take 2 tablets by mouth at bedtime. For AFTER surgery, do not take if having diarrhea, Disp: 30 tablet, Rfl: 0 .  sodium chloride (OCEAN) 0.65 % SOLN nasal spray, Place 1 spray into both nostrils as needed for congestion., Disp: , Rfl:  .  traMADol (ULTRAM) 50 MG tablet, Take 1 tablet (50 mg total) by mouth every 6 (six) hours as needed. For AFTER surgery, do not take and drive, Disp: 10 tablet, Rfl:  0  Review of Systems: Pertinent positives as per HPI. Denies appetite changes, fevers, chills, fatigue, unexplained weight changes. Denies hearing loss, neck lumps or masses, mouth sores, ringing in ears or voice changes. Denies cough or wheezing.  Denies shortness of breath. Denies  chest pain or palpitations.  Denies abdominal distention, blood in stools, diarrhea, nausea, vomiting, or early satiety. Denies pain with intercourse, dysuria, frequency, hematuria or incontinence. Denies hot flashes, vaginal bleeding or vaginal discharge.   Denies joint pain, or muscle pain/cramps. Denies itching, rash, or wounds. Denies dizziness, headaches, numbness or seizures. Denies swollen lymph nodes or glands, denies easy bruising or bleeding. Denies anxiety, depression, confusion, or decreased concentration.  Physical Exam: BP (!) 149/78 (BP Location: Left Arm, Patient Position: Sitting)   Pulse 63   Temp 98.5 F (36.9 C) (Oral)   Resp 18   Ht 5' 3" (1.6 m)   Wt 226 lb 6 oz (102.7 kg)   SpO2 100%   BMI 40.10 kg/m  General: Alert, oriented, no acute distress. HEENT: Normocephalic, atraumatic, sclera anicteric. Chest: Unlabored breathing on room air. Abdomen: Obese, soft, nontender.  Normoactive bowel sounds.  No masses or hepatosplenomegaly appreciated.  Well-healing laparoscopic incisions. Extremities: Grossly normal range of motion.  Warm, well perfused.  Trace edema bilaterally. Skin: No rashes or lesions noted. Back: Some tightness appreciated in the right paraspinous muscles in the low back. GU: Normal appearing external genitalia without erythema, excoriation, or lesions.  Speculum exam reveals vaginal suture intact, no discharge or bleeding.  Vaginal cuff intact.  Bimanual exam reveals cuff intact, no significant tenderness, no fluctuance.    Laboratory & Radiologic Studies: None new  Assessment & Plan: LUNETTA MARINA is a 79 y.o. woman with Stage IA grade 1 low-risk  endometrial cancer who presents for follow-up post-op.  Patient is overall doing well and meeting postoperative milestones.  We discussed decreasing her Tylenol use.  Some of this seems to be anticipatory for pain rather than treating any discomfort.  Overall, her exam is reassuring.  We talked about the sutures at her vaginal cuff and within her vagina will likely start to dissolve in the next couple of weeks and she may have some spotting with this.  We also reviewed lifting restrictions and other activity restrictions.  Per [facility] surveillance recommendations, we discussed that she will have surveillance visits every 6 months initially for the first 1 year and then transition to yearly. We reviewed signs and symptoms that would be concerning for possible recurrence such as vaginal bleeding, discharge, abdominal pain, changes to bowel function and unintentional weight loss.  We discussed again the role that estrogen plays in the pathogenesis of endometrial cancer.  The patient is motivated to lose weight and has been working more on increasing her activity.  We discussed the importance of diet to and that weight loss will have an impact not only on her overall cancer prognosis but her other health issues and mortality.  I offered referral to our nutritionist over the patient would like to spend several months working on weight loss on her own.  I have asked her to call me in 3 months if she is struggling to lose weight and would like to see a nutritionist.  24 minutes of total time was spent for this patient encounter, including preparation, face-to-face counseling with the patient and coordination of care, and documentation of the encounter.  Jeral Pinch, MD  Division of Gynecologic Oncology  Department of Obstetrics and Gynecology  [facility]

## 2019-10-05 ENCOUNTER — Other Ambulatory Visit: Payer: Self-pay

## 2019-10-05 ENCOUNTER — Telehealth: Payer: Self-pay | Admitting: Cardiovascular Disease

## 2019-10-05 ENCOUNTER — Emergency Department (HOSPITAL_COMMUNITY)
Admission: EM | Admit: 2019-10-05 | Discharge: 2019-10-05 | Disposition: A | Discharge status: Home / Self Care | Payer: Medicare Other | Attending: Emergency Medicine | Admitting: Emergency Medicine

## 2019-10-05 ENCOUNTER — Telehealth: Payer: Self-pay

## 2019-10-05 ENCOUNTER — Encounter (HOSPITAL_COMMUNITY): Payer: Self-pay

## 2019-10-05 ENCOUNTER — Emergency Department (HOSPITAL_COMMUNITY): Payer: Medicare Other

## 2019-10-05 DIAGNOSIS — Z8673 Personal history of transient ischemic attack (TIA), and cerebral infarction without residual deficits: Secondary | ICD-10-CM | POA: Diagnosis not present

## 2019-10-05 DIAGNOSIS — Z7901 Long term (current) use of anticoagulants: Secondary | ICD-10-CM | POA: Diagnosis not present

## 2019-10-05 DIAGNOSIS — R072 Precordial pain: Secondary | ICD-10-CM | POA: Diagnosis not present

## 2019-10-05 DIAGNOSIS — I10 Essential (primary) hypertension: Secondary | ICD-10-CM

## 2019-10-05 DIAGNOSIS — Z7982 Long term (current) use of aspirin: Secondary | ICD-10-CM | POA: Insufficient documentation

## 2019-10-05 DIAGNOSIS — R0789 Other chest pain: Secondary | ICD-10-CM | POA: Diagnosis not present

## 2019-10-05 DIAGNOSIS — I2510 Atherosclerotic heart disease of native coronary artery without angina pectoris: Secondary | ICD-10-CM | POA: Insufficient documentation

## 2019-10-05 DIAGNOSIS — Z79899 Other long term (current) drug therapy: Secondary | ICD-10-CM | POA: Diagnosis not present

## 2019-10-05 DIAGNOSIS — I700 Atherosclerosis of aorta: Secondary | ICD-10-CM | POA: Diagnosis not present

## 2019-10-05 DIAGNOSIS — R079 Chest pain, unspecified: Secondary | ICD-10-CM | POA: Diagnosis not present

## 2019-10-05 DIAGNOSIS — Z9861 Coronary angioplasty status: Secondary | ICD-10-CM | POA: Diagnosis not present

## 2019-10-05 LAB — CBC
HCT: 42.7 % (ref 36.0–46.0)
Hemoglobin: 13.7 g/dL (ref 12.0–15.0)
MCH: 27.8 pg (ref 26.0–34.0)
MCHC: 32.1 g/dL (ref 30.0–36.0)
MCV: 86.8 fL (ref 80.0–100.0)
Platelets: 250 10*3/uL (ref 150–400)
RBC: 4.92 MIL/uL (ref 3.87–5.11)
RDW: 13.3 % (ref 11.5–15.5)
WBC: 10.9 10*3/uL — ABNORMAL HIGH (ref 4.0–10.5)
nRBC: 0 % (ref 0.0–0.2)

## 2019-10-05 LAB — BASIC METABOLIC PANEL
Anion gap: 10 (ref 5–15)
BUN: 7 mg/dL — ABNORMAL LOW (ref 8–23)
CO2: 24 mmol/L (ref 22–32)
Calcium: 8.9 mg/dL (ref 8.9–10.3)
Chloride: 100 mmol/L (ref 98–111)
Creatinine, Ser: 0.54 mg/dL (ref 0.44–1.00)
GFR calc Af Amer: >60 mL/min (ref >60)
GFR calc non Af Amer: >60 mL/min (ref >60)
Glucose, Bld: 129 mg/dL — ABNORMAL HIGH (ref 70–99)
Potassium: 4 mmol/L (ref 3.5–5.1)
Sodium: 134 mmol/L — ABNORMAL LOW (ref 135–145)

## 2019-10-05 LAB — TROPONIN I (HIGH SENSITIVITY)
Troponin I (High Sensitivity): 4 ng/L (ref <18)
Troponin I (High Sensitivity): 4 ng/L (ref <18)

## 2019-10-05 MED ORDER — SODIUM CHLORIDE 0.9% FLUSH: 3.0000 mL | Freq: Once | INTRAVENOUS | Status: DC

## 2019-10-05 MED ORDER — FAMOTIDINE 20 MG PO TABS
10.0000 mg | ORAL_TABLET | Freq: Once | ORAL | Status: AC
  Administered 2019-10-05: 10 mg via ORAL
  Filled 2019-10-05: qty 1

## 2019-10-05 MED ORDER — ALUM & MAG HYDROXIDE-SIMETH 200-200-20 MG/5ML PO SUSP
15.0000 mL | Freq: Once | ORAL | Status: AC
  Administered 2019-10-05: 15 mL via ORAL
  Filled 2019-10-05: qty 30

## 2019-10-05 MED ORDER — ACETAMINOPHEN 500 MG PO TABS
1000.0000 mg | ORAL_TABLET | Freq: Once | ORAL | Status: AC
  Administered 2019-10-05: 1000 mg via ORAL
  Filled 2019-10-05: qty 2

## 2019-10-05 NOTE — ED Triage Notes (Addendum)
Pt arrives to ED w/ c/o 3/10 centrally located chest pain radiating toward neck. Pt c/o hypertension today w/ SBP 160's. Pt also reports n/v, and dizziness. Pt had recent hysterectomy. Pt denies sob. Pt states dizziness started at 0800, neuro intact. Pt denies unilateral weakness, slurred speech, vision changes, numbness.

## 2019-10-05 NOTE — Telephone Encounter (Signed)
Called and spoke to patient's daughter who is present with the patient. She states that the patient woke up this morning with pain in the middle and left of her chest near her breastbone and upper abdomen. She states that the patient is also having pain in the left side of her neck and is dizzy. Patient took an 81 mg ASA. Denies NTG use and patient is refusing to take it because she is scared. Patient is also hypertensive 160s-180s/80-90s. She states that PCP recommended earlier to take an extra lisinopril to see if that helped with her Sx. With active chest pain and accompanying Sx I have advised for the patient to be seen in the ER. Daughter states that they will give it a little longer to see if taking the extra lisinopril will help and if it doesn't she will take her. NTG use and ER precautions reviewed again. She verbalized understanding.

## 2019-10-05 NOTE — Telephone Encounter (Signed)
Returned call to daughter. She states that she spoke with the PCP's office again and they recommended that she start amlodipine. She states that her mother is still having chest pain, pain in her neck, and is dizzy. Made daughter aware that given her Hx of CAD and the fact that she is still having active chest pain that she should be evaluated in the ER. She verbalized understanding.

## 2019-10-05 NOTE — ED Provider Notes (Signed)
[facility] EMERGENCY DEPARTMENT Provider Note   CSN: [id number] Arrival date & time: 10/05/19  1359     History Chief Complaint  Patient presents with  . Chest Pain    Shirley [name] is a 79 y.o. female.   HPI  Pt reports epigastric/substernal chest pain radiating to L neck that began this am. Pt endorses improvement of pain since onset, w resolution of neck pain. Pain associated with nausea and episode of emesis this morning, which has also since resolved. Pt/daughter endorse hx of reflux with increase in symptoms/burping over the past few weeks due to increased supine relaxation following recent surgery. CP was also accompanied by frontal headache that patient states is now located in occipital region. Daughter at bedside states she had increased concern for CP in the setting of elevated BP measurements at home from baseline. Pt with complicated PMHx that includes obesity, CAD with MI in 2015, prior DVT, endometrial cancer s/p hysterectomy (09/11/19), GERD, HTN, HLD, prior CVA. Denies fever, URI symptoms, SOB, dysuria, rash.      Past Medical History:  Diagnosis Date  . BMI 40.0-44.9, adult ([place])   . CAD in native artery, stent to RCA BMS with MI 2015 11/04/2003   a. inferior STEMI 01/2014 s/p BMS to prox RCA.  [name] DVT (deep venous thrombosis) ([place]) 2003   lower extremity DVT with questionable recurrence in 2005. Treated with 1.5 year course of coumadin. Another admission 2015.  . Endometrial cancer ([place])   . Endometrial thickening on ultra sound 07/2009   path showing Fragments of benign endocervical and squamous mucosa. No dysplasia or malignancy // Followed by Dr. [name]  . First degree AV block   . Gastric ulcer   . GERD (gastroesophageal reflux disease)   . GI bleed   . Hemorrhoid   . Hyperlipidemia LDL goal <70   . Hypertension   . Insomnia 11/02/2010  . Memory difficulties 11/02/2010  . Mild dilation of ascending aorta (HCC)   . Morbid obesity  ([place]) 09/16/2012  . Obesity   . Obstructive sleep apnea 09/23/2010  . Pre-diabetes   . Prediabetes DX: 09/2010   HgA1c 6.1  . Pulmonary nodule   . ST elevation myocardial infarction (STEMI) of inferior wall ([place]) 01/24/2014  . Stroke ([place]) 2015  . Syncope 12/18/2013  . Syncope, near 12/14/2018  . TIA (transient ischemic attack) 09/23/2010   09/13/2010: CT, MRI/MRA no acute process. Carotid doppler: no stenosis. 2D-Echo Normal LV function with an EF 55-60%.   [name] UTI (urinary tract infection) 01/27/2014    Patient Active Problem List   Diagnosis Date Noted  . History of DVT of lower extremity 09/04/2019  . Personal history of venous thrombosis and embolism 06/04/2019  . Endometrial cancer ([place]) 03/12/2019  . Pre-syncope 12/14/2018  . Hyponatremia 12/14/2018  . Hypokalemia 12/14/2018  . Leukocytosis 12/14/2018  . GI bleed 10/05/2018  . Asymptomatic bacteriuria 10/05/2018  . History of CVA (cerebrovascular accident) 10/05/2018  . Acute upper GI bleed 10/05/2018  . Chest pain 07/03/2014  . Dizziness 02/01/2014  . Chest wall pain 02/01/2014  . UTI (urinary tract infection) 01/27/2014  . ST elevation myocardial infarction (STEMI) of inferior wall ([place]) 01/24/2014  . DVT (deep venous thrombosis), unspecified laterality 12/24/2013  . Morbid obesity ([place]) 09/16/2012  . Need for diphtheria-tetanus-pertussis (Tdap) vaccine, adult/adolescent 11/02/2010  . Insomnia 11/02/2010  . Memory difficulties 11/02/2010  . TIA (transient ischemic attack) 09/23/2010  . Hypertension 09/23/2010  . Hyperlipidemia 09/23/2010  .  Hyperglycemia 09/23/2010  . Obstructive sleep apnea 09/23/2010  . Vaginal bleeding problems 09/23/2010  . DVT (deep venous thrombosis) ([place])   . GERD (gastroesophageal reflux disease) 07/07/2010  . Endometrial thickening on ultra sound 07/04/2009  . CAD in native artery, stent to RCA BMS with MI 2015 11/04/2003    Past Surgical History:  Procedure Laterality Date  .  COLONOSCOPY W/ POLYPECTOMY  11/01/2007   8 mm rectal adenoma, hemorrhoids  . CORONARY ANGIOPLASTY WITH STENT PLACEMENT  01/24/14   Inf. STEMI, BMS to RCA  . DILATION AND CURETTAGE OF UTERUS N/A 03/28/2019   Procedure: DILATATION AND CURETTAGE OF UTERUS;  Surgeon: Lafonda Mosses, MD;  Location: [facility];  Service: Gynecology;  Laterality: N/A;  . ESOPHAGOGASTRODUODENOSCOPY N/A 10/06/2018   Procedure: ESOPHAGOGASTRODUODENOSCOPY (EGD);  Surgeon: Doran Stabler, MD;  Location: [facility];  Service: Gastroenterology;  Laterality: N/A;  . HOT HEMOSTASIS N/A 10/06/2018   Procedure: HOT HEMOSTASIS (ARGON PLASMA COAGULATION/BICAP);  Surgeon: Doran Stabler, MD;  Location: [facility];  Service: Gastroenterology;  Laterality: N/A;  . INTRAUTERINE DEVICE (IUD) INSERTION N/A 03/28/2019   Procedure: INTRAUTERINE DEVICE (IUD) INSERTION;  Surgeon: Lafonda Mosses, MD;  Location: [facility];  Service: Gynecology;  Laterality: N/A;  . KNEE SURGERY  1996   Left knee arthroscopy.  [name] LEFT HEART CATHETERIZATION WITH CORONARY ANGIOGRAM N/A 01/24/2014   Procedure: LEFT HEART CATHETERIZATION WITH CORONARY ANGIOGRAM;  Surgeon: Burnell Blanks, MD;  Location: [facility] CATH LAB;  Service: Cardiovascular;  Laterality: N/A;  . ROBOTIC ASSISTED TOTAL HYSTERECTOMY WITH BILATERAL SALPINGO OOPHERECTOMY N/A 09/11/2019   Procedure: XI ROBOTIC ASSISTED TOTAL HYSTERECTOMY WITH BILATERAL SALPINGO OOPHORECTOMY, LYMPH NODE DISSECTION;  Surgeon: Lafonda Mosses, MD;  Location: [facility];  Service: Gynecology;  Laterality: N/A;  . ROTATOR CUFF REPAIR     right.  [name]  10/06/2018   Procedure: [name];  Surgeon: Doran Stabler, MD;  Location: [facility] ENDOSCOPY;  Service: Gastroenterology;;  . [name] NODE BIOPSY N/A 09/11/2019   Procedure: SENTINEL NODE BIOPSY;  Surgeon: Lafonda Mosses, MD;  Location: [facility];  Service: Gynecology;  Laterality: N/A;  . TUBAL LIGATION       OB History    Gravida  7   Para   6   Term      Preterm      AB      Living        SAB      TAB      Ectopic      Multiple      Live Births              Family History  Problem Relation Age of Onset  . Diabetes Mother   . Hyperlipidemia Mother   . Heart disease Mother   . Dementia Mother   . Angina Father   . Heart attack Father   . Thyroid cancer Sister   . Liver cancer Sister   . Heart disease Brother        x 2 in 51s yo  . Alzheimer's disease Maternal Grandmother   . Stroke Neg Hx   . Colon cancer Neg Hx   . Esophageal cancer Neg Hx   . Rectal cancer Neg Hx   . Stomach cancer Neg Hx   . Ovarian cancer Neg Hx   . Endometrial cancer Neg Hx     Social History   Tobacco Use  . Smoking status: Never Smoker  . Smokeless tobacco:  Never Used  Vaping Use  . Vaping Use: Never used  Substance Use Topics  . Alcohol use: No  . Drug use: No    Home Medications Prior to Admission medications   Medication Sig Start Date End Date Taking? Authorizing Provider  acetaminophen (TYLENOL) 500 MG tablet Take 500 mg by mouth 2 (two) times daily as needed (for pain).   Yes [provider]  amLODipine (NORVASC) 2.5 MG tablet Take 2.5 mg by mouth 2 (two) times daily as needed (for a Systolic reading of 976 or greater).   Yes [provider]  apixaban (ELIQUIS) 2.5 MG TABS tablet Take 1 tablet (2.5 mg total) by mouth 2 (two) times daily. 11/14/18  Yes Burnell Blanks, MD  aspirin EC 81 MG tablet Take 81 mg by mouth as needed (for chest pain). Swallow whole.   Yes [provider]  atorvastatin (LIPITOR) 80 MG tablet Take 1 tablet (80 mg total) by mouth daily. Patient taking differently: Take 80 mg by mouth daily at 6 PM.  01/12/19  Yes Burnell Blanks, MD  cholecalciferol (VITAMIN D3) 25 MCG (1000 UNIT) tablet Take 1,000 Units by mouth daily.   Yes [provider]  ferrous gluconate (IRON 27) 240 (27 FE) MG tablet Take 240 mg by mouth daily with lunch.    Yes [provider]  lisinopril (ZESTRIL) 40 MG tablet Take 40 mg by mouth daily.   Yes [provider]  metoprolol succinate (TOPROL-XL) 25 MG 24 hr tablet Take 1 tablet (25 mg total) by mouth daily. 01/12/19  Yes Burnell Blanks, MD  Multiple Vitamin (MULTIVITAMIN WITH MINERALS) TABS Take 1 tablet by mouth every morning.   Yes [provider]  Naphazoline HCl (CLEAR EYES OP) Place 1 drop into both eyes as needed (for irritation or dryness).    Yes [provider]  nitroGLYCERIN (NITROSTAT) 0.4 MG SL tablet Place 1 tablet (0.4 mg total) under the tongue every 5 (five) minutes x 3 doses as needed for chest pain. 09/07/17  Yes Belva Crome, MD  sodium chloride (OCEAN) 0.65 % SOLN nasal spray Place 1 spray into both nostrils as needed for congestion.   Yes [provider]  ibuprofen (ADVIL) 200 MG tablet Take 200 mg by mouth every 6 (six) hours as needed for headache or moderate pain. Patient not taking: Reported on 10/05/2019    [provider]  nitrofurantoin, macrocrystal-monohydrate, (MACROBID) 100 MG capsule Take 1 capsule (100 mg total) by mouth 2 (two) times daily. Patient not taking: Reported on 10/05/2019 09/07/19   Joylene John D, NP  pantoprazole (PROTONIX) 40 MG tablet TAKE 1 TABLET BY MOUTH EVERY DAY Patient taking differently: Take 40 mg by mouth daily.  08/14/19   Doran Stabler, MD  senna-docusate (SENOKOT-S) 8.6-50 MG tablet Take 2 tablets by mouth at bedtime. For AFTER surgery, do not take if having diarrhea Patient not taking: Reported on 10/05/2019 08/21/19   Joylene John D, NP  traMADol (ULTRAM) 50 MG tablet Take 1 tablet (50 mg total) by mouth every 6 (six) hours as needed. For AFTER surgery, do not take and drive Patient not taking: Reported on 10/05/2019 08/21/19   Joylene John D, NP  lisinopril-hydrochlorothiazide (ZESTORETIC) 20-25 MG tablet TAKE 1 TABLET BY MOUTH EVERY DAY Patient taking differently: Take 1 tablet by  mouth daily.  10/17/18 01/20/19  Burnell Blanks, MD    Allergies    Iohexol  Review of Systems   Review of  Systems  Constitutional: Negative for chills and fever.  HENT: Negative for congestion, rhinorrhea and sore throat.   Eyes: Negative for visual disturbance.  Respiratory: Negative for cough and shortness of breath.   Cardiovascular: Positive for chest pain. Negative for leg swelling.  Gastrointestinal: Positive for abdominal pain, nausea and vomiting. Negative for blood in stool and diarrhea.       Nausea/vomiting has since resolved since onset this am   Genitourinary: Negative for dysuria and hematuria.  Musculoskeletal: Positive for neck pain. Negative for back pain and gait problem.  Skin: Negative for rash and wound.  Neurological: Positive for headaches. Negative for dizziness.  Psychiatric/Behavioral: Negative.     Physical Exam Updated Vital Signs BP (!) 184/81   Pulse 66   Temp 98.1 F (36.7 C) (Oral)   Resp 19   SpO2 96%   Physical Exam Vitals and nursing note reviewed.  Constitutional:      General: She is not in acute distress.    Appearance: She is obese. She is not ill-appearing, toxic-appearing or diaphoretic.  HENT:     Head: Normocephalic and atraumatic.  Cardiovascular:     Rate and Rhythm: Normal rate and regular rhythm.     Heart sounds: Normal heart sounds. No murmur heard.   Pulmonary:     Effort: Pulmonary effort is normal. No respiratory distress.     Breath sounds: Normal breath sounds. No decreased breath sounds, wheezing or rhonchi.  Chest:     Chest wall: No tenderness.  Abdominal:     General: Bowel sounds are normal.     Palpations: Abdomen is soft.     Tenderness: There is abdominal tenderness in the epigastric area. There is no guarding or rebound.  Musculoskeletal:     Right lower leg: No tenderness. No edema.     Left lower leg: No tenderness. No edema.  Skin:    General: Skin is warm.     Findings: No rash.   Neurological:     General: No focal deficit present.     Mental Status: She is alert and oriented to person, place, and time.  Psychiatric:        Mood and Affect: Mood normal.        Behavior: Behavior normal.     ED Results / Procedures / Treatments   Labs (all labs ordered are listed, but only abnormal results are displayed) Labs Reviewed  BASIC METABOLIC PANEL - Abnormal; Notable for the following components:      Result Value   Sodium 134 (*)    Glucose, Bld 129 (*)    BUN 7 (*)    All other components within normal limits  CBC - Abnormal; Notable for the following components:   WBC 10.9 (*)    All other components within normal limits  TROPONIN I (HIGH SENSITIVITY)  TROPONIN I (HIGH SENSITIVITY)    EKG EKG Interpretation  Date/Time:  Friday October 05 2019 14:13:11 EDT Ventricular Rate:  69 PR Interval:  252 QRS Duration: 120 QT Interval:  398 QTC Calculation: 426 R Axis:   -46 Text Interpretation: Sinus rhythm with 1st degree A-V block Right bundle branch block Left anterior fascicular block Septal infarct , age undetermined Abnormal ECG Confirmed by Gerlene Fee [phone]) on 10/05/2019 8:26:18 PM   Radiology DG Chest 2 View  Result Date: 10/05/2019 CLINICAL DATA:  Chest pain. EXAM: CHEST - 2 VIEW COMPARISON:  Chest x-ray dated January 20, 2019. FINDINGS: The heart size and mediastinal contours  are within normal limits. Atherosclerotic calcification of the aortic arch. Normal pulmonary vascularity. No focal consolidation, pleural effusion, or pneumothorax. No acute osseous abnormality. IMPRESSION: No active cardiopulmonary disease. Electronically Signed   By: Titus Dubin M.D.   On: 10/05/2019 15:17    Procedures Procedures (including critical care time)  Medications Ordered in ED Medications  sodium chloride flush (NS) 0.9 % injection 3 mL (3 mLs Intravenous Not Given 10/05/19 2252)  alum & mag hydroxide-simeth (MAALOX/MYLANTA) 200-200-20 MG/5ML suspension 15 mL  (15 mLs Oral Given 10/05/19 2105)  famotidine (PEPCID) tablet 10 mg (10 mg Oral Given 10/05/19 2105)  acetaminophen (TYLENOL) tablet 1,000 mg (1,000 mg Oral Given 10/05/19 2105)    ED Course  I have reviewed the triage vital signs and the nursing notes.  Pertinent labs & imaging results that were available during my care of the patient were reviewed by me and considered in my medical decision making (see chart for details).    MDM Rules/Calculators/A&P                          Pt is a 79 y.o. female with pertinent PMHX of  obesity, CAD with MI in 2015, prior DVT, endometrial cancer s/p hysterectomy (09/11/19), GERD, HTN, HLD, prior CVA who presents w/ chest pain that began this am. Since onset chest pain has since improved. Associated symptoms at onset included L neck pain, frontal headache, nausea/vomiting which have also resolved. Pt now reports an occipital headache.   On arrival pt afebrile, HDS, NAD. Pt hypertensive to SBP 180s. Exam significant for epigastric tenderness. Abdomen soft, non-distended, no concern for peritonitis. Denies dysuria, hematuria, hematochezia, melena. Cardiac auscultation wo murmur. Lungs clear to auscultation. No b/l LEE. Pt given tylenol 1g for Headache, as well as maalox/pepcid in setting of possible GERD.   Because of the age and risk factors of the patient, ACS will be ruled out. Basic labs performed, as above. Chest XR and EKG performed. EKG wo acute findings concerning for ischemia (full interpretation above). EKG reviewed by myself and the attending. Serial trop wnl. Unlikely PNA as CXR wo consolidation, no significant leukocytosis, no cough, no fever. PE considered however low suspicion as patient not tachycardiac, wo SOB. Pt has had recent surgery however has been anticoagulated. No overlying skin changes concerning for shingles. Endo/myo/pericarditis considered however history is not consistent (pain not pleuritic in nature), no recent URI symptoms, no concerning  EKG changes. Hypertensive emergency was considered and discussed at length with patient/daughter. At time of interview pt with SBP 170s-180s and with improvement in symptoms from earlier in day. Pt denies CP at this time, serial trop wnl, No Cr/BUN elevation on CMP. No changes in vision. CXR wo acute findings. Do not believe this represents hypertensive emergency requiring treatment of elevated blood pressures in the emergency department setting. Discussed this at length with daughter/patient and advised close follow up with PCP/cardiologist to discuss adding second agent. Patient/daughter in agreement with this plan. Shared decision making was completed with patient/daughter surrounding admission for further observation vs. Discharge as well as risk for PE. Due to patient on [facility] following surgery and wo SOB will not proceed with CTA PE at this time. Both patient/daughter state that they prefer discharge for outpatient follow up. Prior to d/c home discussed hypertension with both patient/daughter again at length. SBP 170s, patient still without symptoms concerning for hypertensive emergency. Patient/daughter reassured that no urgent anti-hypertensive medication is required at this time. IN  agreement that patient will follow up with outpatient providers for additional anti-hypertensive medications. Strict return precautions given. Pt discharged in stable condition.   The plan for this patient was discussed with Dr. Sedonia Small, who voiced agreement and who oversaw evaluation and treatment of this patient.  Final Clinical Impression(s) / ED Diagnoses Final diagnoses:  Nonspecific chest pain  Hypertension, unspecified type    Rx / DC Orders ED Discharge Orders    None       Kennyth Lose, MD 10/06/19 0013    Maudie Flakes, MD 10/09/19 [phone]

## 2019-10-05 NOTE — Telephone Encounter (Signed)
Pt c/o of Chest Pain: STAT if CP now or developed within 24 hours  1. Are you having CP right now? Yes  2. Are you experiencing any other symptoms (ex. SOB, nausea, vomiting, sweating)? Been feeling dizzy and lightheaded today. Patients daughter Shirley [name] states the patient is having a pain under breast bone, under breast. Took bear asprin for the discomfort. Patient states she is having pain on the left side of her neck, patients daughter advised she pointed under her ear.   3. How long have you been experiencing CP? Just today  4. Is your CP continuous or coming and going? Comes and goes  5. Have you taken Nitroglycerin? No  ?  What are your last 5 BP readings?  7/2 8am 166/90 7/2 830am 168/96 7/2 09am 180/91 7/2 1015am 166/88 7/2 1030am 166/85   PCP was contacted by patients daughter, the patient requested to make Dr. [name] aware. Patient is not having any pain at the incision site, just under breast in the middle of her chest.

## 2019-10-05 NOTE — ED Notes (Signed)
Pts daughter concerned with pts high blood pressure, which was retaken, and is worried that no one is concerned about it. Pt was informed of their place in line and was still irritated that her mother hasn't been seen by a doctor yet.

## 2019-10-05 NOTE — Telephone Encounter (Signed)
Follow up  Pt's daughter calling back she said she spoke with pt's pcp and gave a different info she was told to hold lisinopril until tomorrow and prescribed new medication for amlodipine. She wants to speak with a nurse again

## 2019-10-05 NOTE — ED Notes (Signed)
Verbalized understanding of DC instructions and follow up care with PCP/Cards.  Dr Sedonia Small, Dr Alric Ran, and myself discussed with patient and daughter in length regarding BP before DC

## 2019-10-06 ENCOUNTER — Other Ambulatory Visit: Payer: Self-pay | Admitting: Gastroenterology

## 2019-10-09 ENCOUNTER — Telehealth: Payer: Self-pay | Admitting: Cardiovascular Disease

## 2019-10-09 MED ORDER — AMLODIPINE BESYLATE 10 MG PO TABS: 10.0000 mg | ORAL_TABLET | Freq: Every day | ORAL | 0 refills | Status: DC

## 2019-10-09 NOTE — Telephone Encounter (Signed)
error 

## 2019-10-09 NOTE — Telephone Encounter (Signed)
Called and instructed the patient to decrease lisinopril to 40 mg QD and increase amlodipine to 10 mg QD. Instructed the patient to continue to monitor BP and keep appointment on Friday.

## 2019-10-09 NOTE — Telephone Encounter (Signed)
I would increase the Norvasc to 10 mg daily and reduce the Lisinopril to 40 mg daily. Gerald Stabs

## 2019-10-09 NOTE — Telephone Encounter (Signed)
Pt c/o BP issue: STAT if pt c/o blurred vision, one-sided weakness or slurred speech  1. What are your last 5 BP readings? 130/79; 73; 197/92; 68; 195/103; 80; 173/88; 65; 161/83; 70  2. Are you having any other symptoms (ex. Dizziness, headache, blurred vision, passed out)? Headache, passing out, dizziness  3. What is your BP issue? BP is extremely high over weekend. Went to ER due to BP

## 2019-10-09 NOTE — Telephone Encounter (Signed)
Called and spoke to patient's daughter. She states that the patient was seen in the ER on Friday and ruled out ACS. She states that the patient has continued to be hypertensive with a HA and is dizzy. SBPs 150s-190s, HR 60-70s. Patient is taking Toprol 25 mg QD. Patient was taking lisinopril 40 mg QD, however on Friday PCP recommended patient increase lisinopril to 80 mg QD, so patient has been taking lisinopril 80 mg QD since Friday. Amlodipine 2.5 mg BID prn for SBP >170 was added as well. The patient took amlodipine BID on Friday and QD since and continues to be hypertensive.  Made daughter aware that the patient should not be taking lisinopril 80 mg QD. Made her aware that I will forward to Dr. [name] for review and recommendation. Patient has an appointment with Melina Copa, PA already scheduled on 7/9.

## 2019-10-10 ENCOUNTER — Encounter: Payer: Self-pay | Admitting: Physician Assistant

## 2019-10-10 NOTE — Progress Notes (Signed)
Cardiology Office Note    Date:  10/12/2019   ID:  Shirley [name], DOB [birth year], MRN [id number]  PCP:  Shirley Seashore, MD  Cardiologist:  Lauree Chandler, MD  Electrophysiologist:  None   Chief Complaint: f/u elevated BP, chest pain  History of Present Illness:   Shirley [name] is a 79 y.o. female with history of CAD with inferior STEMI 01/2014 s/p BMS to prox RCA, HTN, HLD, pre-DM, TIA, possible history of stroke, morbid obesity, recurrent DVT, mildly dilated ascending aorta, pulmonary nodule, GI bleed/anemia in 10/2018, sleep apnea, endometrial cancer s/p hysterectomy, trifascicular block on EKG who is seen for f/u blood pressure and cardiac assessment.  She has history of MI as above, with LHC at that time showing no significant residual disease. Last echo in 2015 showed mild LVH, EF 55-60%, mildly dilated ascending aorta, trileaflet AV. CT angio chest 2015 showed bovine arch anatomy, 78mm pulonary nodule. A nuclear stress test was arranged on 09/27/17 for atypical disocmfort and was low risk with normal LV function and no ischemia; there was a possible apical lateral scar. She was admitted 10/2018 with near-syncope and a GI bleed. EGD 10/06/18: Large amount of blood, oozing cratered gastric ulcer. Anticoagulation was paused. Her Coumadin was later changed to DVT ppx dosing Eliquis (per Dr. [name], to be managed by primary care). Her aspirin was discontinued. She underwent TAH/BSO 09/11/19. She was seen in the ED 10/05/19 with epigastric/substernal chest pain, neck pain, nausea, emesis, and headache. She had taken 1 PRN aspirin. Blood pressure was elevated. Troponins were negative. EDP did not feel there was concern for PE given chronic DVT ppx. Per phone note 10/09/19, her PCP had increased lisinopril to 80mg  daily but Dr. [name] recommended to increase amlodipine to 10mg  daily and keep lisinopril back at 40mg  dose.  She is seen back today in follow-up and reports she has felt  much better since the medications were adjusted. For the past few days she's felt back to herself. No recurrent CP or headache reported. Blood pressure is well controlled.    Labwork independently reviewed: 10/05/19 hsTroponins negative, WBC 10.9, Hgb 13.7, Na 134, K 4.0, Cr 0.54 09/2019 albumin 3.3, AST/ALT OK 12/2018 TSH wnl 2019 LDL 48   Past Medical History:  Diagnosis Date  . BMI 40.0-44.9, adult ([place])   . CAD in native artery, stent to RCA BMS with MI 2015 11/04/2003   a. inferior STEMI 01/2014 s/p BMS to prox RCA.  [name] DVT (deep venous thrombosis) ([place]) 2003   lower extremity DVT with questionable recurrence in 2005. Another admission 2015.  . Endometrial cancer ([place])   . Endometrial thickening on ultra sound 07/2009   path showing Fragments of benign endocervical and squamous mucosa. No dysplasia or malignancy // Followed by Dr. [name]  . First degree AV block   . Gastric ulcer   . GERD (gastroesophageal reflux disease)   . GI bleed   . Hemorrhoid   . Hyperlipidemia LDL goal <70   . Hypertension   . Insomnia 11/02/2010  . Memory difficulties 11/02/2010  . Mild dilation of ascending aorta ([facility])   . Morbid obesity ([place]) 09/16/2012  . Obstructive sleep apnea 09/23/2010  . Pre-diabetes   . Pulmonary nodule   . ST elevation myocardial infarction (STEMI) of inferior wall ([place]) 01/24/2014  . Stroke ([place]) 2015  . Syncope 12/18/2013  . Syncope, near 12/14/2018  . TIA (transient ischemic attack) 09/23/2010   09/13/2010: CT, MRI/MRA no acute process. Carotid doppler:  no stenosis. 2D-Echo Normal LV function with an EF 55-60%.   . Trifascicular block   . UTI (urinary tract infection) 01/27/2014    Past Surgical History:  Procedure Laterality Date  . COLONOSCOPY W/ POLYPECTOMY  11/01/2007   8 mm rectal adenoma, hemorrhoids  . CORONARY ANGIOPLASTY WITH STENT PLACEMENT  01/24/14   Inf. STEMI, BMS to RCA  . DILATION AND CURETTAGE OF UTERUS N/A 03/28/2019   Procedure: DILATATION AND  CURETTAGE OF UTERUS;  Surgeon: Lafonda Mosses, MD;  Location: [facility];  Service: Gynecology;  Laterality: N/A;  . ESOPHAGOGASTRODUODENOSCOPY N/A 10/06/2018   Procedure: ESOPHAGOGASTRODUODENOSCOPY (EGD);  Surgeon: Doran Stabler, MD;  Location: [facility];  Service: Gastroenterology;  Laterality: N/A;  . HOT HEMOSTASIS N/A 10/06/2018   Procedure: HOT HEMOSTASIS (ARGON PLASMA COAGULATION/BICAP);  Surgeon: Doran Stabler, MD;  Location: [facility];  Service: Gastroenterology;  Laterality: N/A;  . INTRAUTERINE DEVICE (IUD) INSERTION N/A 03/28/2019   Procedure: INTRAUTERINE DEVICE (IUD) INSERTION;  Surgeon: Lafonda Mosses, MD;  Location: [facility];  Service: Gynecology;  Laterality: N/A;  . KNEE SURGERY  1996   Left knee arthroscopy.  [name] LEFT HEART CATHETERIZATION WITH CORONARY ANGIOGRAM N/A 01/24/2014   Procedure: LEFT HEART CATHETERIZATION WITH CORONARY ANGIOGRAM;  Surgeon: Burnell Blanks, MD;  Location[facility] C[facility];  Service: Cardiovascular;  Laterality: N/A;  . ROBOTIC ASSISTED TOTAL HYSTERECTOMY WITH BILATERAL SALPINGO OOPHERECTOMY N/A 09/11/2019   Procedure: XI ROBOTIC ASSISTED TOTAL HYSTERECTOMY WITH BILATERAL SALPINGO OOPHORECTOMY, LYMPH NODE DISSECTION;  Surgeon: Lafonda Mosses, MD;  Location: [facility];  Service: Gynecology;  Laterality: N/A;  . ROTATOR CUFF REPAIR     right.  [name]  10/06/2018   Procedure: [name];  Surgeon: Doran Stabler, MD;  Location: [facility] ENDOSCOPY;  Service: Gastroenterology;;  . [name] NODE BIOPSY N/A 09/11/2019   Procedure: SENTINEL NODE BIOPSY;  Surgeon: Lafonda Mosses, MD;  Location: [facility];  Service: Gynecology;  Laterality: N/A;  . TUBAL LIGATION      Current Medications: Current Meds  Medication Sig  . acetaminophen (TYLENOL) 500 MG tablet Take 500 mg by mouth 2 (two) times daily as needed (for pain).  [name] amLODipine (NORVASC) 10 MG tablet Take 1 tablet (10 mg total) by mouth daily.  [name] apixaban (ELIQUIS) 2.5 MG  TABS tablet Take 1 tablet (2.5 mg total) by mouth 2 (two) times daily.  [name] atorvastatin (LIPITOR) 80 MG tablet Take 1 tablet (80 mg total) by mouth daily.  . cholecalciferol (VITAMIN D3) 25 MCG (1000 UNIT) tablet Take 1,000 Units by mouth daily.  . ferrous gluconate (IRON 27) 240 (27 FE) MG tablet Take 240 mg by mouth daily with lunch.  . ibuprofen (ADVIL) 200 MG tablet Take 200 mg by mouth every 6 (six) hours as needed for headache or moderate pain.   [name] lisinopril (ZESTRIL) 40 MG tablet Take 40 mg by mouth daily.  . metoprolol succinate (TOPROL-XL) 25 MG 24 hr tablet Take 1 tablet (25 mg total) by mouth daily.  . Multiple Vitamin (MULTIVITAMIN WITH MINERALS) TABS Take 1 tablet by mouth every morning.  . Naphazoline HCl (CLEAR EYES OP) Place 1 drop into both eyes as needed (for irritation or dryness).   . nitroGLYCERIN (NITROSTAT) 0.4 MG SL tablet Place 1 tablet (0.4 mg total) under the tongue every 5 (five) minutes x 3 doses as needed for chest pain.  . pantoprazole (PROTONIX) 40 MG tablet TAKE 1 TABLET BY MOUTH EVERY DAY  . sodium chloride (  OCEAN) 0.65 % SOLN nasal spray Place 1 spray into both nostrils as needed for congestion.     Allergies:   Iohexol   Social History   Socioeconomic History  . Marital status: Married    Spouse name: Not on file  . Number of children: 6  . Years of education: College  . Highest education level: Not on file  Occupational History  . Occupation: Control and instrumentation engineer  . Occupation: TEACHER- [employer]    Employer: [employer]  Tobacco Use  . Smoking status: Never Smoker  . Smokeless tobacco: Never Used  Vaping Use  . Vaping Use: Never used  Substance and Sexual Activity  . Alcohol use: No  . Drug use: No  . Sexual activity: Not on file  Other Topics Concern  . Not on file  Social History Narrative   Full Code status.   Insurance: BCBS   Social Determinants of Health   Financial Resource Strain:   . Difficulty of Paying  Living Expenses:   Food Insecurity:   . Worried About Charity fundraiser in the Last Year:   . Arboriculturist in the Last Year:   Transportation Needs:   . Film/video editor (Medical):   [name] Lack of Transportation (Non-Medical):   Physical Activity:   . Days of Exercise per Week:   . Minutes of Exercise per Session:   Stress:   . Feeling of Stress :   Social Connections:   . Frequency of Communication with Friends and Family:   . Frequency of Social Gatherings with Friends and Family:   . Attends Religious Services:   . Active Member of Clubs or Organizations:   . Attends Archivist Meetings:   [name] Marital Status:      Family History:  The patient's family history includes Alzheimer's disease in her maternal grandmother; Angina in her father; Dementia in her mother; Diabetes in her mother; Heart attack in her father; Heart disease in her brother and mother; Hyperlipidemia in her mother; Liver cancer in her sister; Thyroid cancer in her sister. There is no history of Stroke, Colon cancer, Esophageal cancer, Rectal cancer, Stomach cancer, Ovarian cancer, or Endometrial cancer.  ROS:   Please see the history of present illness.  All other systems are reviewed and otherwise negative.    EKGs/Labs/Other Studies Reviewed:    Studies reviewed are outlined and summarized above. Reports included below if pertinent.  2D echo 2015 - Left ventricle: The cavity size was normal. There was mild  concentric hypertrophy. Systolic function was normal. The  estimated ejection fraction was in the range of 55% to 60%. Wall  motion was normal; there were no regional wall motion  abnormalities.  - Aortic valve: There was trivial regurgitation.  - Ascending aorta: The ascending aorta was mildly dilated.  - Mitral valve: Calcified annulus.    NST 09/2017   Nuclear stress EF: 76%. No wall motion abnormalities  There was no ST segment deviation noted during stress.  Defect  1: There is a small defect of mild severity present in the apical lateral and apex location. No ischemia identified  Overall low risk study with no ischemia identified. Small apical lateral fixed defect consistent with old infarct. Old inferior ST elevation myocardial infarction by history.       EKG:  EKG is not ordered but personally reviewed from 10/05/19 showing NSR 69bpm with trifascicular block - 1st degree AVB, RBBB, LAFB, possible prior septal infarct, no  acute STT changes  Recent Labs: 12/13/2018: Magnesium 1.8 12/14/2018: TSH 0.940 09/05/2019: ALT 20 10/05/2019: BUN 7; Creatinine, Ser 0.54; Hemoglobin 13.7; Platelets 250; Potassium 4.0; Sodium 134  Recent Lipid Panel    Component Value Date/Time   CHOL 107 03/19/2014 0745   TRIG 110.0 03/19/2014 0745   HDL 24.40 (L) 03/19/2014 0745   CHOLHDL 4 03/19/2014 0745   VLDL 22.0 03/19/2014 0745   LDLCALC 61 03/19/2014 0745    PHYSICAL EXAM:    VS:  BP 120/70   Pulse 67   Ht 5\' 3"  (1.6 m)   Wt 230 lb (104.3 kg)   SpO2 97%   BMI 40.74 kg/m   BMI: Body mass index is 40.74 kg/m.  GEN: Well nourished, well developed Hispanic F in no acute distress HEENT: normocephalic, atraumatic Neck: no JVD, carotid bruits, or masses Cardiac: RRR; no murmurs, rubs, or gallops, no edema  Respiratory:  clear to auscultation bilaterally, normal work of breathing GI: soft, nontender, nondistended, + BS MS: no deformity or atrophy Skin: warm and dry, no rash Neuro:  Alert and Oriented x 3, Strength and sensation are intact, follows commands Psych: euthymic mood, full affect  Wt Readings from Last 3 Encounters:  10/12/19 230 lb (104.3 kg)  10/01/19 226 lb 6 oz (102.7 kg)  09/05/19 233 lb 6.4 oz (105.9 kg)     ASSESSMENT & PLAN:   1. HTN - BP improved with recent medication changes. Continue present regimen. 2. CAD with recent chest pain - suspect related to HTN. ED workup reassuring. Continue BB, statin. Clarified with pt that she is no longer  on daily ASA due to history above.  Symptoms have resolved so continue to follow clinically. 3. Hyperlipidemia - last LDL by [facility] in 2019 was 49, not rechecked recently. Will obtain when she comes in for her CTA. 4. H/o pulmonary nodule and mildly dilated ascending aorta - CT requested last summer and patient deferred. She is now agreeable to proceed. Will arrange. BMET 7/2 showed normal kidney function. 5. Trifasicular block on EKG - HR normal, no symptoms of bradycardia. Continue metoprolol cautiously for now but would not increase dose as a primary antihypertensive. Reviewed warning symptoms of bradycardia. 6. OSA - did not tolerate CPAP in the past due to the noise. She does not wish to revisit treatment or evaluation of this at present time but will give it some thought.   Disposition: F/u with Dr. [name] in 6 months.  Medication Adjustments/Labs and Tests Ordered: Current medicines are reviewed at length with the patient today.  Concerns regarding medicines are outlined above. Medication changes, Labs and Tests ordered today are summarized above and listed in the Patient Instructions accessible in Encounters.   Signed, Charlie Pitter, PA-C  10/12/2019 12:00 PM    [address], [place], [place]  [address] Phone: [phone]; Fax: [phone]

## 2019-10-11 ENCOUNTER — Encounter (HOSPITAL_COMMUNITY): Payer: Self-pay | Admitting: Gynecologic Oncology

## 2019-10-12 ENCOUNTER — Ambulatory Visit (INDEPENDENT_AMBULATORY_CARE_PROVIDER_SITE_OTHER): Payer: Medicare Other | Admitting: Physician Assistant

## 2019-10-12 ENCOUNTER — Other Ambulatory Visit: Payer: Self-pay

## 2019-10-12 ENCOUNTER — Telehealth: Payer: Self-pay | Admitting: Physician Assistant

## 2019-10-12 ENCOUNTER — Encounter: Payer: Self-pay | Admitting: Physician Assistant

## 2019-10-12 VITALS — BP 120/70 | HR 67 | Ht 63.0 in | Wt 230.0 lb

## 2019-10-12 DIAGNOSIS — R079 Chest pain, unspecified: Secondary | ICD-10-CM

## 2019-10-12 DIAGNOSIS — E785 Hyperlipidemia, unspecified: Secondary | ICD-10-CM | POA: Diagnosis not present

## 2019-10-12 DIAGNOSIS — I2510 Atherosclerotic heart disease of native coronary artery without angina pectoris: Secondary | ICD-10-CM | POA: Diagnosis not present

## 2019-10-12 DIAGNOSIS — I453 Trifascicular block: Secondary | ICD-10-CM

## 2019-10-12 DIAGNOSIS — I10 Essential (primary) hypertension: Secondary | ICD-10-CM

## 2019-10-12 DIAGNOSIS — I77810 Thoracic aortic ectasia: Secondary | ICD-10-CM

## 2019-10-12 DIAGNOSIS — R911 Solitary pulmonary nodule: Secondary | ICD-10-CM | POA: Diagnosis not present

## 2019-10-12 DIAGNOSIS — G4733 Obstructive sleep apnea (adult) (pediatric): Secondary | ICD-10-CM

## 2019-10-12 NOTE — Patient Instructions (Addendum)
Medication Instructions:  Your physician recommends that you continue on your current medications as directed. Please refer to the Current Medication list given to you today.  *If you need a refill on your cardiac medications before your next appointment, please call your pharmacy*   Lab Work: [facility] CTA  If you have labs (blood work) drawn today and your tests are completely normal, you will receive your results only by: [name] MyChart Message (if you have MyChart) OR . A paper copy in the mail If you have any lab test that is abnormal or we need to change your treatment, we will call you to review the results.   Testing/Procedures: Your physician recommended you to have a Chest CTA.  A Non-Cardiac CT Angiography (CTA), is a special type of CT scan that uses a computer to produce multi-dimensional views of major blood vessels throughout the body. In CT angiography, a contrast material is injected through an IV to help visualize the blood vessels    Follow-Up: At [facility], you and your health needs are our priority.  As part of our continuing mission to provide you with exceptional heart care, we have created designated Provider Care Teams.  These Care Teams include your primary Cardiologist (physician) and Advanced Practice Providers (APPs -  Physician Assistants and Nurse Practitioners) who all work together to provide you with the care you need, when you need it.  We recommend signing up for the patient portal called "MyChart".  Sign up information is provided on this After Visit Summary.  MyChart is used to connect with patients for Virtual Visits (Telemedicine).  Patients are able to view lab/test results, encounter notes, upcoming appointments, etc.  Non-urgent messages can be sent to your provider as well.   To learn more about what you can do with MyChart, go to [email or web address].    Your next appointment:   6 month(s)  The format for your next  appointment:   In Person  Provider:   You may see Lauree Chandler, MD or one of the following Advanced Practice Providers on your designated Care Team:    Melina Copa, PA-C  Ermalinda Barrios, PA-C    Other Instructions

## 2019-10-12 NOTE — Telephone Encounter (Signed)
Returned call to pt and daughter, Caren Griffins. They have been made aware that the order that she sees on her paperwork from today's visit regarding EKG, that it was the one from the ED visit on 7/2.   I assured them both that pt didn't have a EKG order from here today and certainly no charge.  They both thanked me for returning their call and the assurance.

## 2019-10-12 NOTE — Telephone Encounter (Signed)
Shirley [name] is calling stating the AVS from [facility] appt today states she had an EKG performed when she did not. Please advise.

## 2019-10-15 ENCOUNTER — Telehealth: Payer: Self-pay | Admitting: *Deleted

## 2019-10-15 MED ORDER — PREDNISONE 50 MG PO TABS
ORAL_TABLET | ORAL | 0 refills | Status: DC
Start: 2019-10-15 — End: 2020-04-21

## 2019-10-15 NOTE — Telephone Encounter (Signed)
Call placed to daughter, Caren Griffins, [place] on file.  She has been made aware that pt will need Abx Prophylaxis for her upcoming CT due to allergy to loxehol.  She has been made aware of the following:     1. Prednisone 50 mg - take 13 hours prior to test 2. Take another Prednisone 50 mg 7 hours prior to test 3. Take another Prednisone 50 mg 1 hour prior to test 4. Take Benadryl 50 mg 1 hour prior to test  Patient must complete all four doses of above prophylactic medications.  Patient will need a ride after test due to Benadryl  Prescription has been sent to [facility] per daughter's request.

## 2019-10-15 NOTE — Telephone Encounter (Signed)
Pt was sent in 13 hour prep for Iohexol allergy, not ABX Prophylaxis.

## 2019-10-17 ENCOUNTER — Other Ambulatory Visit: Payer: Medicare Other | Admitting: *Deleted

## 2019-10-17 ENCOUNTER — Ambulatory Visit (INDEPENDENT_AMBULATORY_CARE_PROVIDER_SITE_OTHER)
Admission: RE | Admit: 2019-10-17 | Discharge: 2019-10-17 | Disposition: A | Discharge status: Home / Self Care | Payer: Medicare Other | Source: Ambulatory Visit | Attending: Physician Assistant | Admitting: Physician Assistant

## 2019-10-17 ENCOUNTER — Other Ambulatory Visit: Payer: Self-pay

## 2019-10-17 DIAGNOSIS — R911 Solitary pulmonary nodule: Secondary | ICD-10-CM | POA: Diagnosis not present

## 2019-10-17 DIAGNOSIS — E785 Hyperlipidemia, unspecified: Secondary | ICD-10-CM

## 2019-10-17 DIAGNOSIS — I10 Essential (primary) hypertension: Secondary | ICD-10-CM | POA: Diagnosis not present

## 2019-10-17 DIAGNOSIS — G4733 Obstructive sleep apnea (adult) (pediatric): Secondary | ICD-10-CM

## 2019-10-17 DIAGNOSIS — K449 Diaphragmatic hernia without obstruction or gangrene: Secondary | ICD-10-CM | POA: Diagnosis not present

## 2019-10-17 DIAGNOSIS — I2510 Atherosclerotic heart disease of native coronary artery without angina pectoris: Secondary | ICD-10-CM

## 2019-10-17 DIAGNOSIS — I82409 Acute embolism and thrombosis of unspecified deep veins of unspecified lower extremity: Secondary | ICD-10-CM

## 2019-10-17 DIAGNOSIS — I700 Atherosclerosis of aorta: Secondary | ICD-10-CM | POA: Diagnosis not present

## 2019-10-17 DIAGNOSIS — I77810 Thoracic aortic ectasia: Secondary | ICD-10-CM

## 2019-10-17 DIAGNOSIS — R079 Chest pain, unspecified: Secondary | ICD-10-CM

## 2019-10-17 DIAGNOSIS — I712 Thoracic aortic aneurysm, without rupture: Secondary | ICD-10-CM | POA: Diagnosis not present

## 2019-10-17 DIAGNOSIS — I453 Trifascicular block: Secondary | ICD-10-CM

## 2019-10-17 LAB — BASIC METABOLIC PANEL
BUN/Creatinine Ratio: 23 (ref 12–28)
BUN: 13 mg/dL (ref 8–27)
CO2: 19 mmol/L — ABNORMAL LOW (ref 20–29)
Calcium: 9.4 mg/dL (ref 8.7–10.3)
Chloride: 97 mmol/L (ref 96–106)
Creatinine, Ser: 0.57 mg/dL (ref 0.57–1.00)
GFR calc Af Amer: 102 mL/min/{1.73_m2} (ref >59)
GFR calc non Af Amer: 88 mL/min/{1.73_m2} (ref >59)
Glucose: 151 mg/dL — ABNORMAL HIGH (ref 65–99)
Potassium: 4.1 mmol/L (ref 3.5–5.2)
Sodium: 133 mmol/L — ABNORMAL LOW (ref 134–144)

## 2019-10-17 LAB — CBC
Hematocrit: 41.2 % (ref 34.0–46.6)
Hemoglobin: 13.3 g/dL (ref 11.1–15.9)
MCH: 27.8 pg (ref 26.6–33.0)
MCHC: 32.3 g/dL (ref 31.5–35.7)
MCV: 86 fL (ref 79–97)
Platelets: 267 10*3/uL (ref 150–450)
RBC: 4.79 x10E6/uL (ref 3.77–5.28)
RDW: 13.1 % (ref 11.7–15.4)
WBC: 12.2 10*3/uL — ABNORMAL HIGH (ref 3.4–10.8)

## 2019-10-17 LAB — LIPID PANEL
Chol/HDL Ratio: 2.7 ratio (ref 0.0–4.4)
Cholesterol, Total: 129 mg/dL (ref 100–199)
HDL: 47 mg/dL (ref >39)
LDL Chol Calc (NIH): 71 mg/dL (ref 0–99)
Triglycerides: 50 mg/dL (ref 0–149)
VLDL Cholesterol Cal: 11 mg/dL (ref 5–40)

## 2019-10-17 MED ORDER — DIPHENHYDRAMINE HCL 50 MG/ML IJ SOLN: 50.0000 mg | Freq: Once | INTRAMUSCULAR | Status: DC

## 2019-10-17 MED ORDER — PREDNISONE 1 MG PO TABS
50.0000 mg | ORAL_TABLET | Freq: Four times a day (QID) | ORAL | Status: DC
Start: 2019-10-17 — End: 2019-10-18

## 2019-10-17 MED ORDER — DIPHENHYDRAMINE HCL 25 MG PO CAPS: 50.0000 mg | ORAL_CAPSULE | Freq: Once | ORAL | Status: DC

## 2019-10-17 MED ORDER — IOHEXOL 350 MG/ML SOLN
80.0000 mL | Freq: Once | INTRAVENOUS | Status: AC | PRN
  Administered 2019-10-17: 80 mL via INTRAVENOUS

## 2019-11-22 DIAGNOSIS — N390 Urinary tract infection, site not specified: Secondary | ICD-10-CM | POA: Diagnosis not present

## 2019-11-22 DIAGNOSIS — I10 Essential (primary) hypertension: Secondary | ICD-10-CM | POA: Diagnosis not present

## 2019-11-22 DIAGNOSIS — E1165 Type 2 diabetes mellitus with hyperglycemia: Secondary | ICD-10-CM | POA: Diagnosis not present

## 2019-11-22 DIAGNOSIS — Z79899 Other long term (current) drug therapy: Secondary | ICD-10-CM | POA: Diagnosis not present

## 2019-11-22 DIAGNOSIS — E782 Mixed hyperlipidemia: Secondary | ICD-10-CM | POA: Diagnosis not present

## 2019-11-22 DIAGNOSIS — R600 Localized edema: Secondary | ICD-10-CM | POA: Diagnosis not present

## 2019-11-29 DIAGNOSIS — N182 Chronic kidney disease, stage 2 (mild): Secondary | ICD-10-CM | POA: Diagnosis not present

## 2019-11-29 DIAGNOSIS — Z0000 Encounter for general adult medical examination without abnormal findings: Secondary | ICD-10-CM | POA: Diagnosis not present

## 2019-11-29 DIAGNOSIS — D6859 Other primary thrombophilia: Secondary | ICD-10-CM | POA: Diagnosis not present

## 2019-12-04 ENCOUNTER — Ambulatory Visit: Payer: Self-pay | Attending: Internal Medicine

## 2019-12-04 DIAGNOSIS — Z23 Encounter for immunization: Secondary | ICD-10-CM

## 2019-12-04 NOTE — Progress Notes (Signed)
   Covid-19 Vaccination Clinic  Name:  Shirley [name]    MRN: [id number] DOB: 08-31-1940  12/04/2019  [name] was observed post Covid-19 immunization for 15 minutes without incident. She was provided with Vaccine Information Sheet and instruction to access the V-Safe system.   [name] was instructed to call 911 with any severe reactions post vaccine: [name] Difficulty breathing  . Swelling of face and throat  . A fast heartbeat  . A bad rash all over body  . Dizziness and weakness

## 2019-12-05 ENCOUNTER — Other Ambulatory Visit: Payer: Self-pay | Admitting: Hematology

## 2019-12-05 ENCOUNTER — Other Ambulatory Visit: Payer: Self-pay

## 2019-12-05 DIAGNOSIS — Z86718 Personal history of other venous thrombosis and embolism: Secondary | ICD-10-CM

## 2019-12-05 NOTE — Progress Notes (Signed)
[place]   [phone] [phone]   Clinic Follow up Note   Patient Care Team: Merrilee Seashore, MD as PCP - General (Internal Medicine) Burnell Blanks, MD as PCP - Cardiology (Cardiology)  Date of Service:  12/06/2019  CHIEF COMPLAINT: F/u ofRecurrent DVT, GI bleed on anticoagulation  SUMMARY OF ONCOLOGIC HISTORY: Oncology History Overview Note  IHC MMR intact MSI stable    Endometrial cancer ([place])  02/26/2019 Initial Biopsy   Endometrial biopsy revealed grade 1 endometrioid adenocarcinoma   02/26/2019 Initial Diagnosis   Endometrial cancer ([place])   03/27/2019 Imaging   CT abdomen and pelvis: Diffuse endometrial thickening, consistent with known endometrial carcinoma.  No evidence of metastatic disease within the abdomen or pelvis.  Several small uterine fibroids largest measuring 4 cm.   03/28/2019 Surgery   D&C: Pathology shows endometrioid carcinoma, FIGO grade 1-2   03/28/2019 Treatment Plan Change   Mirena IUD inserted   07/02/2019 Pathology Results   EMB: focal Gr1 endometrioid adenocarcinoma Stromal changes c/w hormone effect   09/11/2019 Surgery   TRH/BSO, SLN  On EUA, 8-10cm uterus, mobile. On intra-abdominal entry, normal upper abdominal survey. Normal appearing small and large bowel, omentum. Normal appearing bilateral adnexa. Uterus and bulbous, especially at the fundus, with several 1-2cm intra-mural fibroids. Mapping successful to bilateral obturator nodes. IUD noted within the uterus after specimen removal. NO intra-abdominal or pelvic evidence of disease.   09/11/2019 Pathology Results   IA, grade 1 endometrioid, no MI, negative SLNs, no LVSI   09/11/2019 Cancer Staging   Staging form: Corpus Uteri - Carcinoma and Carcinosarcoma, AJCC 8th Edition - Clinical stage from 09/11/2019: FIGO Stage IA (cT1a, cN0, cM0) - Signed by Lafonda Mosses, MD on 09/17/2019     History of Illness: 1. Left LE DVT after  surgery in 2012, treated with short course of coumadin  2. In June of 2014 she complained of pain in her right leg. There had been no specific provoking event leading to this symptom. Evaluation showed a DVT again in the left leg. She was started [employer]. 3. She presents to the ED ON 12/18/2013 after an episode of near syncope at home. Collapsed to ground but no LOC. No injury with this. Had some upper left-sided headache but this is like her usual headaches. Has h/o DVT x2 in the past, on chronic xarelto, legs were swollen. Bilateral lower extremity venous duplex completed. Bilateral lower extremities are positivefor deep vein thrombosis involving bilateral posterior tibial and left peroneal veins. There is no evidence of Baker's cyst bilaterally. CTA without pulmonary embolism. Placed on IV heparin 12.5 ml/hr and Warfarin 7.5 mg/daily (dosing per pharmacy), Xarelto discontinued.She was placed on Coumadin.  4.Aspirin 81 mg was added in 2015 after MI. 5. She was evaluated in the ED on 10/05/2018 for weakness, epigastric discomfort, brown emesis, and near syncope. She was found to be anemic with Hgb 9.6 and heme positive stool.Hemoglobin dropped to 7.9 and she was transfused 2 units packed redcellsandFFP.She underwent upper endoscopy by Dr. Loletha Carrow on 10/06/2018 which showed a large amount of fresh and clottedblood in the gastric fundus and in the gastric body as well as an oozing gastric ulcer which was cauterized. Aspirin and Coumadin were held and she was treated with PPI. 6. Repeat endoscopy on 11/06/18 showed resolution of the gastric ulcer. Hpylori was eradicated.She remained off anticoagulation until 8/11 when she was started on prophylactic dose ofEliquis2.5 mg BID per Dr. Ashby Dawes. She did not resume aspirin  CURRENT THERAPY:  Eliquis 2.$RemoveBe'5mg'hVwbeasVQ$  BID daily(she has been taking 2.$RemoveBefor'5mg'BrSqvboUhBwu$  daily for a while)  INTERVAL HISTORY:  Shirley [name] is here for a follow up for DVT management  and recently diagnosed endometrial cancer. She presents to the clinic alone.  She underwent robotic assisted total hysterectomy and also by Dr. Berline Lopes on 09/11/2019.  She tolerated surgery well, and has recovered well.  She reports chronic bilateral lower extremity edema, improved after surgery, however it has been getting worse in the past few weeks, no leg pain, no difficulty walking.  No fever or chills.  She does not exercise, has sedentary lifestyle, does knitting etc during day.  Review of systems otherwise negative.  MEDICAL HISTORY:  Past Medical History:  Diagnosis Date  . BMI 40.0-44.9, adult ([place])   . CAD in native artery, stent to RCA BMS with MI 2015 11/04/2003   a. inferior STEMI 01/2014 s/p BMS to prox RCA.  [name] DVT (deep venous thrombosis) ([place]) 2003   lower extremity DVT with questionable recurrence in 2005. Another admission 2015.  . Endometrial cancer ([place])   . Endometrial thickening on ultra sound 07/2009   path showing Fragments of benign endocervical and squamous mucosa. No dysplasia or malignancy // Followed by Dr. [name]  . First degree AV block   . Gastric ulcer   . GERD (gastroesophageal reflux disease)   . GI bleed   . Hemorrhoid   . Hyperlipidemia LDL goal <70   . Hypertension   . Insomnia 11/02/2010  . Memory difficulties 11/02/2010  . Mild dilation of ascending aorta ([facility])   . Morbid obesity ([place]) 09/16/2012  . Obstructive sleep apnea 09/23/2010  . Pre-diabetes   . Pulmonary nodule   . ST elevation myocardial infarction (STEMI) of inferior wall ([place]) 01/24/2014  . Stroke ([place]) 2015  . Syncope 12/18/2013  . Syncope, near 12/14/2018  . TIA (transient ischemic attack) 09/23/2010   09/13/2010: CT, MRI/MRA no acute process. Carotid doppler: no stenosis. 2D-Echo Normal LV function with an EF 55-60%.   . Trifascicular block   . UTI (urinary tract infection) 01/27/2014    SURGICAL HISTORY: Past Surgical History:  Procedure Laterality Date  . COLONOSCOPY W/  POLYPECTOMY  11/01/2007   8 mm rectal adenoma, hemorrhoids  . CORONARY ANGIOPLASTY WITH STENT PLACEMENT  01/24/14   Inf. STEMI, BMS to RCA  . DILATION AND CURETTAGE OF UTERUS N/A 03/28/2019   Procedure: DILATATION AND CURETTAGE OF UTERUS;  Surgeon: Lafonda Mosses, MD;  Location: [facility];  Service: Gynecology;  Laterality: N/A;  . ESOPHAGOGASTRODUODENOSCOPY N/A 10/06/2018   Procedure: ESOPHAGOGASTRODUODENOSCOPY (EGD);  Surgeon: Doran Stabler, MD;  Location: [place] [facility];  Service: Gastroenterology;  Laterality: N/A;  . HOT HEMOSTASIS N/A 10/06/2018   Procedure: HOT HEMOSTASIS (ARGON PLASMA COAGULATION/BICAP);  Surgeon: Doran Stabler, MD;  Location: [place] [facility];  Service: Gastroenterology;  Laterality: N/A;  . INTRAUTERINE DEVICE (IUD) INSERTION N/A 03/28/2019   Procedure: INTRAUTERINE DEVICE (IUD) INSERTION;  Surgeon: Lafonda Mosses, MD;  Location: [facility];  Service: Gynecology;  Laterality: N/A;  . KNEE SURGERY  1996   Left knee arthroscopy.  [name] LEFT HEART CATHETERIZATION WITH CORONARY ANGIOGRAM N/A 01/24/2014   Procedure: LEFT HEART CATHETERIZATION WITH CORONARY ANGIOGRAM;  Surgeon: Burnell Blanks, MD;  Location: [facility] CATH LAB;  Service: Cardiovascular;  Laterality: N/A;  . ROBOTIC ASSISTED TOTAL HYSTERECTOMY WITH BILATERAL SALPINGO OOPHERECTOMY N/A 09/11/2019   Procedure: XI ROBOTIC ASSISTED TOTAL HYSTERECTOMY WITH BILATERAL SALPINGO OOPHORECTOMY, LYMPH NODE DISSECTION;  Surgeon: Lafonda Mosses, MD;  Location: [facility];  Service: Gynecology;  Laterality: N/A;  . ROTATOR CUFF REPAIR     right.  [name]  10/06/2018   Procedure: [name];  Surgeon: Doran Stabler, MD;  Location: [facility] ENDOSCOPY;  Service: Gastroenterology;;  . [name] NODE BIOPSY N/A 09/11/2019   Procedure: SENTINEL NODE BIOPSY;  Surgeon: Lafonda Mosses, MD;  Location: [facility];  Service: Gynecology;  Laterality: N/A;  . TUBAL LIGATION      I have reviewed the social history and family  history with the patient and they are unchanged from previous note.  ALLERGIES:  is allergic to iohexol.  MEDICATIONS:  Current Outpatient Medications  Medication Sig Dispense Refill  . acetaminophen (TYLENOL) 500 MG tablet Take 500 mg by mouth 2 (two) times daily as needed (for pain).    [name] amLODipine (NORVASC) 10 MG tablet Take 1 tablet (10 mg total) by mouth daily. 90 tablet 0  . apixaban (ELIQUIS) 2.5 MG TABS tablet Take 1 tablet (2.5 mg total) by mouth 2 (two) times daily. 60 tablet 5  . atorvastatin (LIPITOR) 80 MG tablet Take 1 tablet (80 mg total) by mouth daily. 90 tablet 3  . cholecalciferol (VITAMIN D3) 25 MCG (1000 UNIT) tablet Take 1,000 Units by mouth daily.    . ferrous gluconate (IRON 27) 240 (27 FE) MG tablet Take 240 mg by mouth daily with lunch.    . ibuprofen (ADVIL) 200 MG tablet Take 200 mg by mouth every 6 (six) hours as needed for headache or moderate pain.     [name] lisinopril (ZESTRIL) 40 MG tablet Take 40 mg by mouth daily.    . metoprolol succinate (TOPROL-XL) 25 MG 24 hr tablet Take 1 tablet (25 mg total) by mouth daily. 90 tablet 3  . Multiple Vitamin (MULTIVITAMIN WITH MINERALS) TABS Take 1 tablet by mouth every morning.    . Naphazoline HCl (CLEAR EYES OP) Place 1 drop into both eyes as needed (for irritation or dryness).     . nitroGLYCERIN (NITROSTAT) 0.4 MG SL tablet Place 1 tablet (0.4 mg total) under the tongue every 5 (five) minutes x 3 doses as needed for chest pain. 25 tablet 3  . pantoprazole (PROTONIX) 40 MG tablet TAKE 1 TABLET BY MOUTH EVERY DAY 90 tablet 0  . predniSONE (DELTASONE) 50 MG tablet Take 1 tablet by mouth 13 hours prior to your CT, take 1 tablet by mouth 7 hours prior to your CT, and take 1 tablet by mouth 1 hour prior to your CT along with 1 Benadryl 50 mg tablet.. 3 tablet 0  . sodium chloride (OCEAN) 0.65 % SOLN nasal spray Place 1 spray into both nostrils as needed for congestion.     No current facility-administered medications for this  visit.    PHYSICAL EXAMINATION: ECOG PERFORMANCE STATUS: 1 - Symptomatic but completely ambulatory  Vitals:   12/06/19 0940  BP: (!) 140/57  Pulse: 73  Resp: 18  Temp: 98.5 F (36.9 C)  SpO2: 100%   Filed Weights   12/06/19 0940  Weight: 243 lb 3.2 oz (110.3 kg)    GENERAL:alert, no distress and comfortable SKIN: skin color, texture, turgor are normal, no rashes or significant lesions EYES: normal, Conjunctiva are pink and non-injected, sclera clear NECK: supple, thyroid normal size, non-tender, without nodularity LYMPH:  no palpable lymphadenopathy in the cervical, axillary  LUNGS: clear to auscultation and percussion with normal breathing effort HEART: regular rate & rhythm and no murmurs  and no lower extremity edema ABDOMEN:abdomen soft, non-tender and normal bowel sounds, small incisions healed well Musculoskeletal:no cyanosis of digits and no clubbing  NEURO: alert & oriented x 3 with fluent speech, no focal motor/sensory deficits  LABORATORY DATA:  I have reviewed the data as listed CBC Latest Ref Rng & Units 12/06/2019 10/17/2019 10/05/2019  WBC 4.0 - 10.5 K/uL 8.5 12.2(H) 10.9(H)  Hemoglobin 12.0 - 15.0 g/dL 12.8 13.3 13.7  Hematocrit 36 - 46 % 40.0 41.2 42.7  Platelets 150 - 400 K/uL 206 267 250     CMP Latest Ref Rng & Units 12/06/2019 10/17/2019 10/05/2019  Glucose 70 - 99 mg/dL 162(H) 151(H) 129(H)  BUN 8 - 23 mg/dL 12 13 7(L)  Creatinine 0.44 - 1.00 mg/dL 0.69 0.57 0.54  Sodium 135 - 145 mmol/L 140 133(L) 134(L)  Potassium 3.5 - 5.1 mmol/L 4.0 4.1 4.0  Chloride 98 - 111 mmol/L 107 97 100  CO2 22 - 32 mmol/L 27 19(L) 24  Calcium 8.9 - 10.3 mg/dL 9.8 9.4 8.9  Total Protein 6.5 - 8.1 g/dL 6.4(L) - -  Total Bilirubin 0.3 - 1.2 mg/dL 0.7 - -  Alkaline Phos 38 - 126 U/L 130(H) - -  AST 15 - 41 U/L 14(L) - -  ALT 0 - 44 U/L 18 - -      RADIOGRAPHIC STUDIES: I have personally reviewed the radiological images as listed and agreed with the findings in the  report. No results found.   ASSESSMENT & PLAN:  Shirley [name] is a 79 y.o. female with    1. RecurrentDVT, Chronic LE edema (L>R) -She has had recurrent DVT while on anticoagulation, previously on Xarelto, and had history of large upper GI bleed while on Coumadin and aspirin 81 mg. Her bleeding ulcer resolved and she recently started prophylactic dose of Eliquis 2.5 mg BID per PCP. She is tolerating well so far. -Will continue Eliquis to 2.$RemoveBefo'5mg'XBaAXItKDeL$  BID indefinitely.  -she has had worsening bilateral lower extremity edema, possibly related to her recent surgery.  I encouraged her to elevate her legs, and wear compression stocks. -F/u in one year   2. Endometrial endometrioid adenocarcinoma, FIGO grade 1, pT1aN0M0 -She was having vaginal bleeding. She underwent D&C on 03/28/19 to have [place] placed for progesterone therapy. Path showed Endometrioid carcinoma, FIGO Grade 1-2. She repeated biopsy on 07/02/19 which showed the same.  -She underwent total hysterectomy with BSO on 09/11/19 with Gyn Dr. Jeral Pinch.  -I reviewed her surgical pathology results with patient, she has favorable histology, low grade very early stage disease, no benefit of adjuvant chemotherapy. -She will continue follow-up with Dr. Berline Lopes for cancer surveillance  3. Comorbidities: HTNCAD, history of recent MI and stroke, HTN  -Continue medications and following up with other physicians.   4. Obesity, sedentary lifestyle, knee pain -I encourage her to exercise and stay active    PLAN: -Lab and follow-up in 1 year -She will follow up with Dr. Berline Lopes for her endometrial cancer surveillance.   No problem-specific Assessment & Plan notes found for this encounter.   No orders of the defined types were placed in this encounter.  All questions were answered. The patient knows to call the clinic with any problems, questions or concerns. No barriers to learning was detected. The total time spent in the  appointment was 30 minutes.     Truitt Merle, MD 12/06/2019   I, Joslyn Devon, am acting as scribe for Truitt Merle, MD.   I have reviewed  the above documentation for accuracy and completeness, and I agree with the above.

## 2019-12-06 ENCOUNTER — Inpatient Hospital Stay: Payer: Medicare Other | Attending: Nurse Practitioner | Admitting: Hematology

## 2019-12-06 ENCOUNTER — Inpatient Hospital Stay: Payer: Medicare Other

## 2019-12-06 ENCOUNTER — Encounter: Payer: Self-pay | Admitting: Hematology

## 2019-12-06 ENCOUNTER — Other Ambulatory Visit: Payer: Self-pay

## 2019-12-06 VITALS — BP 140/57 | HR 73 | Temp 98.5°F | Resp 18 | Ht 63.0 in | Wt 243.2 lb

## 2019-12-06 DIAGNOSIS — I252 Old myocardial infarction: Secondary | ICD-10-CM | POA: Insufficient documentation

## 2019-12-06 DIAGNOSIS — C541 Malignant neoplasm of endometrium: Secondary | ICD-10-CM | POA: Diagnosis not present

## 2019-12-06 DIAGNOSIS — I2510 Atherosclerotic heart disease of native coronary artery without angina pectoris: Secondary | ICD-10-CM | POA: Insufficient documentation

## 2019-12-06 DIAGNOSIS — Z7901 Long term (current) use of anticoagulants: Secondary | ICD-10-CM | POA: Insufficient documentation

## 2019-12-06 DIAGNOSIS — E669 Obesity, unspecified: Secondary | ICD-10-CM | POA: Diagnosis not present

## 2019-12-06 DIAGNOSIS — I119 Hypertensive heart disease without heart failure: Secondary | ICD-10-CM | POA: Diagnosis not present

## 2019-12-06 DIAGNOSIS — Z86718 Personal history of other venous thrombosis and embolism: Secondary | ICD-10-CM

## 2019-12-06 DIAGNOSIS — Z8673 Personal history of transient ischemic attack (TIA), and cerebral infarction without residual deficits: Secondary | ICD-10-CM | POA: Diagnosis not present

## 2019-12-06 DIAGNOSIS — I82409 Acute embolism and thrombosis of unspecified deep veins of unspecified lower extremity: Secondary | ICD-10-CM | POA: Diagnosis not present

## 2019-12-06 DIAGNOSIS — Z90722 Acquired absence of ovaries, bilateral: Secondary | ICD-10-CM | POA: Diagnosis not present

## 2019-12-06 DIAGNOSIS — Z90710 Acquired absence of both cervix and uterus: Secondary | ICD-10-CM | POA: Diagnosis not present

## 2019-12-06 DIAGNOSIS — R609 Edema, unspecified: Secondary | ICD-10-CM | POA: Diagnosis not present

## 2019-12-06 LAB — CMP (CANCER CENTER ONLY)
ALT: 18 U/L (ref 0–44)
AST: 14 U/L — ABNORMAL LOW (ref 15–41)
Albumin: 3.2 g/dL — ABNORMAL LOW (ref 3.5–5.0)
Alkaline Phosphatase: 130 U/L — ABNORMAL HIGH (ref 38–126)
Anion gap: 6 (ref 5–15)
BUN: 12 mg/dL (ref 8–23)
CO2: 27 mmol/L (ref 22–32)
Calcium: 9.8 mg/dL (ref 8.9–10.3)
Chloride: 107 mmol/L (ref 98–111)
Creatinine: 0.69 mg/dL (ref 0.44–1.00)
GFR, Est AFR Am: >60 mL/min (ref >60)
GFR, Estimated: >60 mL/min (ref >60)
Glucose, Bld: 162 mg/dL — ABNORMAL HIGH (ref 70–99)
Potassium: 4 mmol/L (ref 3.5–5.1)
Sodium: 140 mmol/L (ref 135–145)
Total Bilirubin: 0.7 mg/dL (ref 0.3–1.2)
Total Protein: 6.4 g/dL — ABNORMAL LOW (ref 6.5–8.1)

## 2019-12-06 LAB — CBC WITH DIFFERENTIAL (CANCER CENTER ONLY)
Abs Immature Granulocytes: 0.02 10*3/uL (ref 0.00–0.07)
Basophils Absolute: 0.1 10*3/uL (ref 0.0–0.1)
Basophils Relative: 1 %
Eosinophils Absolute: 0.3 10*3/uL (ref 0.0–0.5)
Eosinophils Relative: 4 %
HCT: 40 % (ref 36.0–46.0)
Hemoglobin: 12.8 g/dL (ref 12.0–15.0)
Immature Granulocytes: 0 %
Lymphocytes Relative: 33 %
Lymphs Abs: 2.8 10*3/uL (ref 0.7–4.0)
MCH: 28 pg (ref 26.0–34.0)
MCHC: 32 g/dL (ref 30.0–36.0)
MCV: 87.5 fL (ref 80.0–100.0)
Monocytes Absolute: 0.6 10*3/uL (ref 0.1–1.0)
Monocytes Relative: 7 %
Neutro Abs: 4.7 10*3/uL (ref 1.7–7.7)
Neutrophils Relative %: 55 %
Platelet Count: 206 10*3/uL (ref 150–400)
RBC: 4.57 MIL/uL (ref 3.87–5.11)
RDW: 14.2 % (ref 11.5–15.5)
WBC Count: 8.5 10*3/uL (ref 4.0–10.5)
nRBC: 0 % (ref 0.0–0.2)

## 2019-12-07 ENCOUNTER — Telehealth: Payer: Self-pay | Admitting: Nurse Practitioner

## 2019-12-07 NOTE — Telephone Encounter (Signed)
Scheduled appts per 9/2 los. Left voicemail with appt date and time.

## 2019-12-29 ENCOUNTER — Other Ambulatory Visit: Payer: Self-pay | Admitting: Cardiovascular Disease

## 2020-01-04 DIAGNOSIS — L84 Corns and callosities: Secondary | ICD-10-CM | POA: Diagnosis not present

## 2020-01-04 DIAGNOSIS — Q742 Other congenital malformations of lower limb(s), including pelvic girdle: Secondary | ICD-10-CM | POA: Diagnosis not present

## 2020-01-04 DIAGNOSIS — M79675 Pain in left toe(s): Secondary | ICD-10-CM | POA: Diagnosis not present

## 2020-01-11 DIAGNOSIS — Z23 Encounter for immunization: Secondary | ICD-10-CM | POA: Diagnosis not present

## 2020-01-25 ENCOUNTER — Other Ambulatory Visit: Payer: Self-pay | Admitting: Cardiovascular Disease

## 2020-01-30 DIAGNOSIS — Z1231 Encounter for screening mammogram for malignant neoplasm of breast: Secondary | ICD-10-CM | POA: Diagnosis not present

## 2020-02-01 DIAGNOSIS — H25099 Other age-related incipient cataract, unspecified eye: Secondary | ICD-10-CM | POA: Diagnosis not present

## 2020-02-01 DIAGNOSIS — H5203 Hypermetropia, bilateral: Secondary | ICD-10-CM | POA: Diagnosis not present

## 2020-03-03 ENCOUNTER — Other Ambulatory Visit: Payer: Self-pay | Admitting: Gastroenterology

## 2020-03-07 ENCOUNTER — Other Ambulatory Visit: Payer: Self-pay | Admitting: Cardiovascular Disease

## 2020-03-19 ENCOUNTER — Telehealth: Payer: Self-pay | Admitting: Cardiovascular Disease

## 2020-03-19 NOTE — Telephone Encounter (Signed)
Information for [facility] in [place] provided. 6 month follow up scheduled.

## 2020-03-19 NOTE — Telephone Encounter (Signed)
Daughter of the patient called. The Daughter wanted to know if the patient should be wearing compression stockings. The daughter also wanted to know if she would need a prescription or if there is some place in particular where she can get some for the patient. The ones she is wearing now are old and worn out. Please advise

## 2020-03-23 NOTE — Progress Notes (Signed)
Gynecologic Oncology Return Clinic Visit  03/24/20  Reason for Visit: surveillance in the setting of early stage uterine cancer  Treatment History: Oncology History Overview Note  IHC MMR intact MSI stable    Endometrial cancer ([place])  02/26/2019 Initial Biopsy   Endometrial biopsy revealed grade 1 endometrioid adenocarcinoma   02/26/2019 Initial Diagnosis   Endometrial cancer ([place])   03/27/2019 Imaging   CT abdomen and pelvis: Diffuse endometrial thickening, consistent with known endometrial carcinoma.  No evidence of metastatic disease within the abdomen or pelvis.  Several small uterine fibroids largest measuring 4 cm.   03/28/2019 Surgery   D&C: Pathology shows endometrioid carcinoma, FIGO grade 1-2   03/28/2019 Treatment Plan Change   Mirena IUD inserted   07/02/2019 Pathology Results   EMB: focal Gr1 endometrioid adenocarcinoma Stromal changes c/w hormone effect   09/11/2019 Surgery   TRH/BSO, SLN  On EUA, 8-10cm uterus, mobile. On intra-abdominal entry, normal upper abdominal survey. Normal appearing small and large bowel, omentum. Normal appearing bilateral adnexa. Uterus and bulbous, especially at the fundus, with several 1-2cm intra-mural fibroids. Mapping successful to bilateral obturator nodes. IUD noted within the uterus after specimen removal. NO intra-abdominal or pelvic evidence of disease.   09/11/2019 Pathology Results   IA, grade 1 endometrioid, no MI, negative SLNs, no LVSI   09/11/2019 Cancer Staging   Staging form: Corpus Uteri - Carcinoma and Carcinosarcoma, AJCC 8th Edition - Clinical stage from 09/11/2019: FIGO Stage IA (cT1a, cN0, cM0) - Signed by Lafonda Mosses, MD on 09/17/2019     Interval History: Reports she is doing well.  Denies any vaginal bleeding or discharge with the exception of yesterday.  Notes having 2 small drops of blood on her panty liner.  She notes normal bowel and bladder function.  She endorses a good appetite without nausea or  emesis.  She feels that her clothes are feeling looser although has gained weight since her last visit.  Saw her medical oncologist recently.  Plan is for her to be on Eliquis indefinitively.  Past Medical/Surgical History: Past Medical History:  Diagnosis Date  . BMI 40.0-44.9, adult ([place])   . CAD in native artery, stent to RCA BMS with MI 2015 11/04/2003   a. inferior STEMI 01/2014 s/p BMS to prox RCA.  [name] DVT (deep venous thrombosis) ([place]) 2003   lower extremity DVT with questionable recurrence in 2005. Another admission 2015.  . Endometrial cancer ([place])   . Endometrial thickening on ultra sound 07/2009   path showing Fragments of benign endocervical and squamous mucosa. No dysplasia or malignancy // Followed by Dr. [name]  . First degree AV block   . Gastric ulcer   . GERD (gastroesophageal reflux disease)   . GI bleed   . Hemorrhoid   . Hyperlipidemia LDL goal <70   . Hypertension   . Insomnia 11/02/2010  . Memory difficulties 11/02/2010  . Mild dilation of ascending aorta (HCC)   . Morbid obesity ([place]) 09/16/2012  . Obstructive sleep apnea 09/23/2010  . Pre-diabetes   . Pulmonary nodule   . ST elevation myocardial infarction (STEMI) of inferior wall ([place]) 01/24/2014  . Stroke ([place]) 2015  . Syncope 12/18/2013  . Syncope, near 12/14/2018  . TIA (transient ischemic attack) 09/23/2010   09/13/2010: CT, MRI/MRA no acute process. Carotid doppler: no stenosis. 2D-Echo Normal LV function with an EF 55-60%.   . Trifascicular block   . UTI (urinary tract infection) 01/27/2014    Past Surgical History:  Procedure Laterality  Date  . COLONOSCOPY W/ POLYPECTOMY  11/01/2007   8 mm rectal adenoma, hemorrhoids  . CORONARY ANGIOPLASTY WITH STENT PLACEMENT  01/24/14   Inf. STEMI, BMS to RCA  . DILATION AND CURETTAGE OF UTERUS N/A 03/28/2019   Procedure: DILATATION AND CURETTAGE OF UTERUS;  Surgeon: Lafonda Mosses, MD;  [facility];  Service: Gynecology;  Laterality: N/A;  .  ESOPHAGOGASTRODUODENOSCOPY N/A 10/06/2018   Procedure: ESOPHAGOGASTRODUODENOSCOPY (EGD);  Surgeon: Doran Stabler, MD;  Location: [place];  Service: Gastroenterology;  Laterality: N/A;  . HOT HEMOSTASIS N/A 10/06/2018   Procedure: HOT HEMOSTASIS (ARGON PLASMA COAGULATION/BICAP);  Surgeon: Doran Stabler, MD;  Location: [place];  Service: Gastroenterology;  Laterality: N/A;  . INTRAUTERINE DEVICE (IUD) INSERTION N/A 03/28/2019   Procedure: INTRAUTERINE DEVICE (IUD) INSERTION;  Surgeon: Lafonda Mosses, MD;  [facility];  Service: Gynecology;  Laterality: N/A;  . KNEE SURGERY  1996   Left knee arthroscopy.  [name] LEFT HEART CATHETERIZATION WITH CORONARY ANGIOGRAM N/A 01/24/2014   Procedure: LEFT HEART CATHETERIZATION WITH CORONARY ANGIOGRAM;  Surgeon: Burnell Blanks, MD;  Location[facility] CATH LAB;  Service: Cardiovascular;  Laterality: N/A;  . ROBOTIC ASSISTED TOTAL HYSTERECTOMY WITH BILATERAL SALPINGO OOPHERECTOMY N/A 09/11/2019   Procedure: XI ROBOTIC ASSISTED TOTAL HYSTERECTOMY WITH BILATERAL SALPINGO OOPHORECTOMY, LYMPH NODE DISSECTION;  Surgeon: Lafonda Mosses, MD;  [facility];  Service: Gynecology;  Laterality: N/A;  . ROTATOR CUFF REPAIR     right.  [name]  10/06/2018   Procedure: [name];  Surgeon: Doran Stabler, MD;  Location: [facility] ENDOSCOPY;  Service: Gastroenterology;;  . [name] NODE BIOPSY N/A 09/11/2019   Procedure: SENTINEL NODE BIOPSY;  Surgeon: Lafonda Mosses, MD;  [facility];  Service: Gynecology;  Laterality: N/A;  . TUBAL LIGATION      Family History  Problem Relation Age of Onset  . Diabetes Mother   . Hyperlipidemia Mother   . Heart disease Mother   . Dementia Mother   . Angina Father   . Heart attack Father   . Thyroid cancer Sister   . Liver cancer Sister   . Heart disease Brother        x 2 in 12s yo  . Alzheimer's disease Maternal Grandmother   . Stroke Neg Hx   . Colon cancer Neg Hx   . Esophageal  cancer Neg Hx   . Rectal cancer Neg Hx   . Stomach cancer Neg Hx   . Ovarian cancer Neg Hx   . Endometrial cancer Neg Hx     Social History   Socioeconomic History  . Marital status: Married    Spouse name: Not on file  . Number of children: 6  . Years of education: College  . Highest education level: Not on file  Occupational History  . Occupation: Control and instrumentation engineer  . Occupation: TEACHER- [employer]    Employer: [employer]  Tobacco Use  . Smoking status: Never Smoker  . Smokeless tobacco: Never Used  Vaping Use  . Vaping Use: Never used  Substance and Sexual Activity  . Alcohol use: No  . Drug use: No  . Sexual activity: Not on file  Other Topics Concern  . Not on file  Social History Narrative   Full Code status.   Insurance: BCBS   Social Determinants of Health   Financial Resource Strain: Not on file  Food Insecurity: Not on file  Transportation Needs: Not on file  Physical Activity:  Not on file  Stress: Not on file  Social Connections: Not on file    Current Medications:  Current Outpatient Medications:  .  acetaminophen (TYLENOL) 500 MG tablet, Take 500 mg by mouth 2 (two) times daily as needed (for pain)., Disp: , Rfl:  .  amLODipine (NORVASC) 10 MG tablet, TAKE 1 TABLET BY MOUTH EVERY DAY, Disp: 90 tablet, Rfl: 2 .  apixaban (ELIQUIS) 2.5 MG TABS tablet, Take 1 tablet (2.5 mg total) by mouth 2 (two) times daily., Disp: 60 tablet, Rfl: 5 .  atorvastatin (LIPITOR) 80 MG tablet, TAKE 1 TABLET BY MOUTH EVERY DAY, Disp: 90 tablet, Rfl: 2 .  cholecalciferol (VITAMIN D3) 25 MCG (1000 UNIT) tablet, Take 1,000 Units by mouth daily., Disp: , Rfl:  .  ibuprofen (ADVIL) 200 MG tablet, Take 200 mg by mouth every 6 (six) hours as needed for headache or moderate pain. , Disp: , Rfl:  .  lisinopril (ZESTRIL) 40 MG tablet, Take 40 mg by mouth daily., Disp: , Rfl:  .  metoprolol succinate (TOPROL-XL) 25 MG 24 hr tablet, TAKE 1 TABLET BY MOUTH EVERY DAY,  Disp: 90 tablet, Rfl: 3 .  Multiple Vitamin (MULTIVITAMIN WITH MINERALS) TABS, Take 1 tablet by mouth every morning., Disp: , Rfl:  .  Naphazoline HCl (CLEAR EYES OP), Place 1 drop into both eyes as needed (for irritation or dryness). , Disp: , Rfl:  .  pantoprazole (PROTONIX) 40 MG tablet, TAKE 1 TABLET BY MOUTH EVERY DAY, Disp: 90 tablet, Rfl: 0 .  sodium chloride (OCEAN) 0.65 % SOLN nasal spray, Place 1 spray into both nostrils as needed for congestion., Disp: , Rfl:  .  Ferrous Gluconate 239 (27 Fe) MG TABS, Take by mouth. (Patient not taking: Reported on 03/24/2020), Disp: , Rfl:  .  nitroGLYCERIN (NITROSTAT) 0.4 MG SL tablet, Place 1 tablet (0.4 mg total) under the tongue every 5 (five) minutes x 3 doses as needed for chest pain. (Patient not taking: Reported on 03/24/2020), Disp: 25 tablet, Rfl: 3 .  predniSONE (DELTASONE) 50 MG tablet, Take 1 tablet by mouth 13 hours prior to your CT, take 1 tablet by mouth 7 hours prior to your CT, and take 1 tablet by mouth 1 hour prior to your CT along with 1 Benadryl 50 mg tablet.. (Patient not taking: Reported on 03/24/2020), Disp: 3 tablet, Rfl: 0  Review of Systems: Denies appetite changes, fevers, chills, fatigue, unexplained weight changes. Denies hearing loss, neck lumps or masses, mouth sores, ringing in ears or voice changes. Denies cough or wheezing.  Denies shortness of breath. Denies chest pain or palpitations. Denies leg swelling. Denies abdominal distention, pain, blood in stools, constipation, diarrhea, nausea, vomiting, or early satiety. Denies pain with intercourse, dysuria, frequency, hematuria or incontinence. Denies hot flashes, pelvic pain, vaginal bleeding or vaginal discharge.   Denies joint pain, back pain or muscle pain/cramps. Denies itching, rash, or wounds. Denies dizziness, headaches, numbness or seizures. Denies swollen lymph nodes or glands, denies easy bruising or bleeding. Denies anxiety, depression, confusion, or  decreased concentration.  Physical Exam: BP 128/72 (BP Location: Left Arm, Patient Position: Sitting)   Pulse 70   Temp 98 F (36.7 C) (Tympanic)   Resp 16   Ht '5\' 3"'  (1.6 m)   Wt 249 lb 11.2 oz (113.3 kg)   SpO2 100%   BMI 44.23 kg/m  General: Alert, oriented, no acute distress. HEENT: Atraumatic, normocephalic, sclera anicteric. Chest: Unlabored breathing on room air. Abdomen: Obese, soft, nontender.  Normoactive bowel sounds.  No masses or hepatosplenomegaly appreciated.  Well-healed incisions. Extremities: Grossly normal range of motion.  Warm, well perfused.  2+ edema to above the knee.  Patient has some bullous-looking lesions more on the left than right lower leg.  Foot is very edematous bilaterally. Lymphatics: No cervical, supraclavicular, or inguinal adenopathy. GU: Normal appearing external genitalia without erythema, excoriation, or lesions.  Speculum exam reveals no bleeding or discharge, no masses or lesions noted.  Bimanual exam reveals cuff intact, no nodularity.  Rectovaginal exam confirms these findings.  Laboratory & Radiologic Studies: None new  Assessment & Plan: Shirley [name] is a 79 y.o. woman with Stage IA grade 1 low-risk endometrial cancer who presents for surveillance visit.  Patient is NED on exam today.  We discussed signs and symptoms again that would be concerning for disease recurrence and the patient understands to call if she develops any of these before her next scheduled visit.  Given her weight gain, I also reviewed the important role that estrogen plays in the pathogenesis of the cancer she had.  It is important from an overall health standpoint and also a cancer related survival that she work on weight loss.  She voices motivation to do this.  She has significant edema in her lower extremities today.  She notes not having worn compression socks the last couple of days.  She is seeing her primary care provider in January.  I have encouraged  her to get tighter compression socks and to talk with her PCP about other interventions given her significant edema.  Per [facility]GO surveillance recommendations, we discussed that she will have surveillance visits every 6 months initially for the first 1 year and then transition to yearly.   35 minutes of total time was spent for this patient encounter, including preparation, face-to-face counseling with the patient and coordination of care, and documentation of the encounter.  Jeral Pinch, MD  Division of Gynecologic Oncology  Department of Obstetrics and Gynecology  [facility]

## 2020-03-24 ENCOUNTER — Other Ambulatory Visit: Payer: Self-pay

## 2020-03-24 ENCOUNTER — Encounter: Payer: Self-pay | Admitting: Gynecologic Oncology

## 2020-03-24 ENCOUNTER — Inpatient Hospital Stay: Payer: Medicare Other | Attending: Gynecologic Oncology | Admitting: Gynecologic Oncology

## 2020-03-24 VITALS — BP 128/72 | HR 70 | Temp 98.0°F | Resp 16 | Ht 63.0 in | Wt 249.7 lb

## 2020-03-24 DIAGNOSIS — Z86718 Personal history of other venous thrombosis and embolism: Secondary | ICD-10-CM | POA: Diagnosis not present

## 2020-03-24 DIAGNOSIS — C541 Malignant neoplasm of endometrium: Secondary | ICD-10-CM | POA: Insufficient documentation

## 2020-03-24 DIAGNOSIS — Z79899 Other long term (current) drug therapy: Secondary | ICD-10-CM | POA: Insufficient documentation

## 2020-03-24 DIAGNOSIS — I2510 Atherosclerotic heart disease of native coronary artery without angina pectoris: Secondary | ICD-10-CM | POA: Diagnosis not present

## 2020-03-24 DIAGNOSIS — I252 Old myocardial infarction: Secondary | ICD-10-CM | POA: Diagnosis not present

## 2020-03-24 DIAGNOSIS — Z7901 Long term (current) use of anticoagulants: Secondary | ICD-10-CM | POA: Insufficient documentation

## 2020-03-24 DIAGNOSIS — Z90722 Acquired absence of ovaries, bilateral: Secondary | ICD-10-CM | POA: Insufficient documentation

## 2020-03-24 DIAGNOSIS — Z8673 Personal history of transient ischemic attack (TIA), and cerebral infarction without residual deficits: Secondary | ICD-10-CM | POA: Insufficient documentation

## 2020-03-24 DIAGNOSIS — Z6841 Body Mass Index (BMI) 40.0 and over, adult: Secondary | ICD-10-CM | POA: Insufficient documentation

## 2020-03-24 DIAGNOSIS — Z90710 Acquired absence of both cervix and uterus: Secondary | ICD-10-CM | POA: Insufficient documentation

## 2020-03-24 DIAGNOSIS — I10 Essential (primary) hypertension: Secondary | ICD-10-CM | POA: Insufficient documentation

## 2020-03-24 NOTE — Patient Instructions (Signed)
Everything looks great on your exam!  I do not see any evidence of cancer.  I will see you in 6 months but please call if you develop any of the symptoms, such as vaginal bleeding or pelvic pain, that we discussed today.  After the holidays, it would be great for your overall health and also the to decrease the risk of cancer recurrence if you could increase your activity level and decrease your calorie intake to help with weight loss.

## 2020-03-25 DIAGNOSIS — G4733 Obstructive sleep apnea (adult) (pediatric): Secondary | ICD-10-CM

## 2020-03-30 DIAGNOSIS — Z03818 Encounter for observation for suspected exposure to other biological agents ruled out: Secondary | ICD-10-CM | POA: Diagnosis not present

## 2020-04-17 DIAGNOSIS — N182 Chronic kidney disease, stage 2 (mild): Secondary | ICD-10-CM | POA: Diagnosis not present

## 2020-04-17 DIAGNOSIS — E782 Mixed hyperlipidemia: Secondary | ICD-10-CM | POA: Diagnosis not present

## 2020-04-17 DIAGNOSIS — I10 Essential (primary) hypertension: Secondary | ICD-10-CM | POA: Diagnosis not present

## 2020-04-17 DIAGNOSIS — E1165 Type 2 diabetes mellitus with hyperglycemia: Secondary | ICD-10-CM | POA: Diagnosis not present

## 2020-04-20 NOTE — Progress Notes (Signed)
Virtual Visit via Video Note   This visit type was conducted due to national recommendations for restrictions regarding the COVID-19 Pandemic (e.g. social distancing) in an effort to limit this patient's exposure and mitigate transmission in our community.  Due to her co-morbid illnesses, this patient is at least at moderate risk for complications without adequate follow up.  This format is felt to be most appropriate for this patient at this time.  All issues noted in this document were discussed and addressed.  A limited physical exam was performed with this format.  Please refer to the patient's chart for her consent to telehealth for [facility].      Date:  04/21/2020   ID:  Shirley [name], DOB 06-20-1940, MRN [id number]  Patient Location: Home Provider Location: Home Office  PCP:  Merrilee Seashore, MD  Cardiologist:  Lauree Chandler, MD  Electrophysiologist:  None   Evaluation Performed:  Follow-Up Visit  Chief Complaint:  Follow up- CAD  History of Present Illness:    80 yo female with history of CAD with inferior STEMI October 2015, HTN, DVT, prior CVA, prior GI bleeding and sleep apnea here today for cardiac follow up. She was admitted to [facility] October 2015 with an inferior STEMI. A bare metal stent was placed in the sub-totally occluded proximal RCA. The LAD and Circumflex had no disease. She has been on long term anti-coagulation with coumadin therapy for recurrent DVT. This has been changed to Eliquis. She was seen in our office June 2019 with chest pain. Nuclear stress test 09/27/17 was low risk with normal LV function and no ischemia. There was a possible apical lateral scar. Admitted with GI bleeding July 2020 and was seen by GI and had an upper endoscopy revealing a gastric ulcer.   The patient denies chest pain, dyspnea, palpitations, dizziness, near syncope or syncope. No lower extremity edema.   The patient does not have symptoms concerning for  COVID-19 infection (fever, chills, cough, or new shortness of breath).    Past Medical History:  Diagnosis Date  . BMI 40.0-44.9, adult ([place])   . CAD in native artery, stent to RCA BMS with MI 2015 11/04/2003   a. inferior STEMI 01/2014 s/p BMS to prox RCA.  [name] DVT (deep venous thrombosis) ([place]) 2003   lower extremity DVT with questionable recurrence in 2005. Another admission 2015.  . Endometrial cancer ([place])   . Endometrial thickening on ultra sound 07/2009   path showing Fragments of benign endocervical and squamous mucosa. No dysplasia or malignancy // Followed by Dr. [name]  . First degree AV block   . Gastric ulcer   . GERD (gastroesophageal reflux disease)   . GI bleed   . Hemorrhoid   . Hyperlipidemia LDL goal <70   . Hypertension   . Insomnia 11/02/2010  . Memory difficulties 11/02/2010  . Mild dilation of ascending aorta (HCC)   . Morbid obesity ([place]) 09/16/2012  . Obstructive sleep apnea 09/23/2010  . Pre-diabetes   . Pulmonary nodule   . ST elevation myocardial infarction (STEMI) of inferior wall ([place]) 01/24/2014  . Stroke ([place]) 2015  . Syncope 12/18/2013  . Syncope, near 12/14/2018  . TIA (transient ischemic attack) 09/23/2010   09/13/2010: CT, MRI/MRA no acute process. Carotid doppler: no stenosis. 2D-Echo Normal LV function with an EF 55-60%.   . Trifascicular block   . UTI (urinary tract infection) 01/27/2014   Past Surgical History:  Procedure Laterality Date  . COLONOSCOPY  W/ POLYPECTOMY  11/01/2007   8 mm rectal adenoma, hemorrhoids  . CORONARY ANGIOPLASTY WITH STENT PLACEMENT  01/24/14   Inf. STEMI, BMS to RCA  . DILATION AND CURETTAGE OF UTERUS N/A 03/28/2019   Procedure: DILATATION AND CURETTAGE OF UTERUS;  Surgeon: Lafonda Mosses, MD;  [facility];  Service: Gynecology;  Laterality: N/A;  . ESOPHAGOGASTRODUODENOSCOPY N/A 10/06/2018   Procedure: ESOPHAGOGASTRODUODENOSCOPY (EGD);  Surgeon: Doran Stabler, MD;  Location: [place];  Service:  Gastroenterology;  Laterality: N/A;  . HOT HEMOSTASIS N/A 10/06/2018   Procedure: HOT HEMOSTASIS (ARGON PLASMA COAGULATION/BICAP);  Surgeon: Doran Stabler, MD;  Location: [place];  Service: Gastroenterology;  Laterality: N/A;  . INTRAUTERINE DEVICE (IUD) INSERTION N/A 03/28/2019   Procedure: INTRAUTERINE DEVICE (IUD) INSERTION;  Surgeon: Lafonda Mosses, MD;  [facility];  Service: Gynecology;  Laterality: N/A;  . KNEE SURGERY  1996   Left knee arthroscopy.  [name] LEFT HEART CATHETERIZATION WITH CORONARY ANGIOGRAM N/A 01/24/2014   Procedure: LEFT HEART CATHETERIZATION WITH CORONARY ANGIOGRAM;  Surgeon: Burnell Blanks, MD;  Location[facility] CATH LAB;  Service: Cardiovascular;  Laterality: N/A;  . ROBOTIC ASSISTED TOTAL HYSTERECTOMY WITH BILATERAL SALPINGO OOPHERECTOMY N/A 09/11/2019   Procedure: XI ROBOTIC ASSISTED TOTAL HYSTERECTOMY WITH BILATERAL SALPINGO OOPHORECTOMY, LYMPH NODE DISSECTION;  Surgeon: Lafonda Mosses, MD;  [facility];  Service: Gynecology;  Laterality: N/A;  . ROTATOR CUFF REPAIR     right.  [name]  10/06/2018   Procedure: [name];  Surgeon: Doran Stabler, MD;  Location: [facility] ENDOSCOPY;  Service: Gastroenterology;;  . [name] NODE BIOPSY N/A 09/11/2019   Procedure: SENTINEL NODE BIOPSY;  Surgeon: Lafonda Mosses, MD;  [facility];  Service: Gynecology;  Laterality: N/A;  . TUBAL LIGATION       Current Meds  Medication Sig  . acetaminophen (TYLENOL) 500 MG tablet Take 500 mg by mouth 2 (two) times daily as needed (for pain).  [name] amLODipine (NORVASC) 10 MG tablet TAKE 1 TABLET BY MOUTH EVERY DAY  . apixaban (ELIQUIS) 2.5 MG TABS tablet Take 1 tablet (2.5 mg total) by mouth 2 (two) times daily.  [name] aspirin EC 81 MG tablet Swallow whole. Take one tablet 3 times week.  [name] atorvastatin (LIPITOR) 80 MG tablet TAKE 1 TABLET BY MOUTH EVERY DAY  . lisinopril (ZESTRIL) 40 MG tablet Take 40 mg by mouth daily.  . metoprolol succinate  (TOPROL-XL) 25 MG 24 hr tablet TAKE 1 TABLET BY MOUTH EVERY DAY  . Multiple Vitamin (MULTIVITAMIN WITH MINERALS) TABS Take 1 tablet by mouth every morning.  . Naphazoline HCl (CLEAR EYES OP) Place 1 drop into both eyes as needed (for irritation or dryness).   . nitroGLYCERIN (NITROSTAT) 0.4 MG SL tablet Place 1 tablet (0.4 mg total) under the tongue every 5 (five) minutes x 3 doses as needed for chest pain.  . pantoprazole (PROTONIX) 40 MG tablet TAKE 1 TABLET BY MOUTH EVERY DAY  . sodium chloride (OCEAN) 0.65 % SOLN nasal spray Place 1 spray into both nostrils as needed for congestion.  . [DISCONTINUED] aspirin EC 81 MG tablet Take 81 mg by mouth as needed. Swallow whole.     Allergies:   Iohexol   Social History   Tobacco Use  . Smoking status: Never Smoker  . Smokeless tobacco: Never Used  Vaping Use  . Vaping Use: Never used  Substance Use Topics  . Alcohol use: No  . Drug use: No  Family Hx: The patient's family history includes Alzheimer's disease in her maternal grandmother; Angina in her father; Dementia in her mother; Diabetes in her mother; Heart attack in her father; Heart disease in her brother and mother; Hyperlipidemia in her mother; Liver cancer in her sister; Thyroid cancer in her sister. There is no history of Stroke, Colon cancer, Esophageal cancer, Rectal cancer, Stomach cancer, Ovarian cancer, or Endometrial cancer.  ROS:   Please see the history of present illness.    All other systems reviewed and are negative.   Prior CV studies:   The following studies were reviewed today:  Echo 01/24/14:  Left ventricle: The cavity size was normal. There was mild concentric hypertrophy. Systolic function was normal. The estimated ejection fraction was in the range of 55% to 60%. Wall motion was normal; there were no regional wall motion abnormalities. - Aortic valve: There was trivial regurgitation. - Ascending aorta: The ascending aorta was mildly  dilated. - Mitral valve: Calcified annulus.  Cardiac cath 01/24/14: Left main: No obstructive disease.  Left Anterior Descending Artery: Large caliber vessel that courses to the apex. Moderate caliber diagonal branch. No obstructive disease.  Circumflex Artery: Large caliber vessel with large obtuse marginal branch. No obstructive disease.  Right Coronary Artery: Moderate caliber co-dominant vessel with sub-totally occluded proximal vessel.  Left Ventricular Angiogram: LVEF=50%  Labs/Other Tests and Data Reviewed:    EKG:  No ECG reviewed.  Recent Labs: 12/06/2019: ALT 18; BUN 12; Creatinine 0.69; Hemoglobin 12.8; Platelet Count 206; Potassium 4.0; Sodium 140   Recent Lipid Panel Lab Results  Component Value Date/Time   CHOL 129 10/17/2019 09:14 AM   TRIG 50 10/17/2019 09:14 AM   HDL 47 10/17/2019 09:14 AM   CHOLHDL 2.7 10/17/2019 09:14 AM   CHOLHDL 4 03/19/2014 07:45 AM   LDLCALC 71 10/17/2019 09:14 AM    Wt Readings from Last 3 Encounters:  04/21/20 242 lb (109.8 kg)  03/24/20 249 lb 11.2 oz (113.3 kg)  12/06/19 243 lb 3.2 oz (110.3 kg)     Objective:    Vital Signs:  BP 118/60   Pulse 64   Ht 5\' 3"  (1.6 m)   Wt 242 lb (109.8 kg)   BMI 42.87 kg/m    VITAL SIGNS:  reviewed GEN:  no acute distress  ASSESSMENT & PLAN:     1. CAD without angina: She has no chest pain. Continue ASA, statin and beta blocker.  She takes ASA 81 mg three days per week.   2. HTN: BP is well controlled. No changes today  3. DVT: She is on lifelong anti-coagulation. Continue Eliquis. This is managed in primary care.   4. HLD: Lipids followed in primary care. LDL at goal in in July 2021. Continue statin  COVID-19 Education: The signs and symptoms of COVID-19 were discussed with the patient and how to seek care for testing (follow up with PCP or arrange E-visit).  The importance of social distancing was discussed today.  Time:   Today, I have spent 20 minutes with the patient  with telehealth technology discussing the above problems.     Medication Adjustments/Labs and Tests Ordered: Current medicines are reviewed at length with the patient today.  Concerns regarding medicines are outlined above.   Tests Ordered: No orders of the defined types were placed in this encounter.   Medication Changes: Meds ordered this encounter  Medications  . aspirin EC 81 MG tablet    Sig: Swallow whole. Take one tablet 3 times  week.    Dispense:  90 tablet    Refill:  3    Disposition:  Follow up in 6 month(s)  Signed, Lauree Chandler, MD  04/21/2020 11:08 AM    [place]

## 2020-04-21 ENCOUNTER — Telehealth (INDEPENDENT_AMBULATORY_CARE_PROVIDER_SITE_OTHER): Payer: Medicare Other | Admitting: Cardiovascular Disease

## 2020-04-21 ENCOUNTER — Encounter: Payer: Self-pay | Admitting: Cardiovascular Disease

## 2020-04-21 ENCOUNTER — Other Ambulatory Visit: Payer: Self-pay

## 2020-04-21 VITALS — BP 118/60 | HR 64 | Ht 63.0 in | Wt 242.0 lb

## 2020-04-21 DIAGNOSIS — E785 Hyperlipidemia, unspecified: Secondary | ICD-10-CM | POA: Diagnosis not present

## 2020-04-21 DIAGNOSIS — I2510 Atherosclerotic heart disease of native coronary artery without angina pectoris: Secondary | ICD-10-CM

## 2020-04-21 DIAGNOSIS — I10 Essential (primary) hypertension: Secondary | ICD-10-CM

## 2020-04-21 MED ORDER — ASPIRIN EC 81 MG PO TBEC
DELAYED_RELEASE_TABLET | ORAL | 3 refills | Status: DC
Start: 2020-04-21 — End: 2022-09-22

## 2020-04-21 NOTE — Patient Instructions (Signed)
Medication Instructions:  Your physician has recommended you make the following change in your medication:  1.) change aspirin 81 mg to one tablet three days a week  *If you need a refill on your cardiac medications before your next appointment, please call your pharmacy*   Lab Work: none If you have labs (blood work) drawn today and your tests are completely normal, you will receive your results only by: [name] MyChart Message (if you have MyChart) OR . A paper copy in the mail If you have any lab test that is abnormal or we need to change your treatment, we will call you to review the results.   Testing/Procedures: none   Follow-Up: At [facility], you and your health needs are our priority.  As part of our continuing mission to provide you with exceptional heart care, we have created designated Provider Care Teams.  These Care Teams include your primary Cardiologist (physician) and Advanced Practice Providers (APPs -  Physician Assistants and Nurse Practitioners) who all work together to provide you with the care you need, when you need it.   Your next appointment:   6 month(s)  The format for your next appointment:   In Person  Provider:   You may see Lauree Chandler, MD or one of the following Advanced Practice Providers on your designated Care Team:    Melina Copa, PA-C  Ermalinda Barrios, PA-C    Other Instructions

## 2020-05-13 DIAGNOSIS — E1151 Type 2 diabetes mellitus with diabetic peripheral angiopathy without gangrene: Secondary | ICD-10-CM | POA: Diagnosis not present

## 2020-05-13 DIAGNOSIS — L603 Nail dystrophy: Secondary | ICD-10-CM | POA: Diagnosis not present

## 2020-05-13 DIAGNOSIS — L84 Corns and callosities: Secondary | ICD-10-CM | POA: Diagnosis not present

## 2020-05-29 ENCOUNTER — Telehealth: Payer: Self-pay | Admitting: Nurse Practitioner

## 2020-05-29 NOTE — Telephone Encounter (Signed)
Left message with moved upcoming appointment due to provider's template. Gave option to call back to reschedule if needed. 

## 2020-06-03 ENCOUNTER — Other Ambulatory Visit: Payer: Self-pay | Admitting: Gastroenterology

## 2020-06-13 DIAGNOSIS — R600 Localized edema: Secondary | ICD-10-CM | POA: Diagnosis not present

## 2020-06-13 DIAGNOSIS — E782 Mixed hyperlipidemia: Secondary | ICD-10-CM | POA: Diagnosis not present

## 2020-06-13 DIAGNOSIS — N182 Chronic kidney disease, stage 2 (mild): Secondary | ICD-10-CM | POA: Diagnosis not present

## 2020-06-13 DIAGNOSIS — R0602 Shortness of breath: Secondary | ICD-10-CM | POA: Diagnosis not present

## 2020-06-13 DIAGNOSIS — E1165 Type 2 diabetes mellitus with hyperglycemia: Secondary | ICD-10-CM | POA: Diagnosis not present

## 2020-06-13 DIAGNOSIS — I10 Essential (primary) hypertension: Secondary | ICD-10-CM | POA: Diagnosis not present

## 2020-06-24 DIAGNOSIS — M67874 Other specified disorders of tendon, left ankle and foot: Secondary | ICD-10-CM | POA: Diagnosis not present

## 2020-06-26 DIAGNOSIS — R600 Localized edema: Secondary | ICD-10-CM | POA: Diagnosis not present

## 2020-06-26 DIAGNOSIS — R0602 Shortness of breath: Secondary | ICD-10-CM | POA: Diagnosis not present

## 2020-07-01 DIAGNOSIS — R600 Localized edema: Secondary | ICD-10-CM | POA: Diagnosis not present

## 2020-07-04 ENCOUNTER — Other Ambulatory Visit: Payer: Self-pay | Admitting: Gastroenterology

## 2020-07-04 DIAGNOSIS — I10 Essential (primary) hypertension: Secondary | ICD-10-CM | POA: Diagnosis not present

## 2020-07-04 DIAGNOSIS — E1165 Type 2 diabetes mellitus with hyperglycemia: Secondary | ICD-10-CM | POA: Diagnosis not present

## 2020-07-04 DIAGNOSIS — R600 Localized edema: Secondary | ICD-10-CM | POA: Diagnosis not present

## 2020-07-04 DIAGNOSIS — E782 Mixed hyperlipidemia: Secondary | ICD-10-CM | POA: Diagnosis not present

## 2020-07-07 NOTE — Progress Notes (Signed)
Cardiology Office Note    Date:  07/09/2020   ID:  Shirley [name], [place] October 23, 1940, MRN [id number]   PCP:  Merrilee Seashore, [place]  Cardiologist:  Lauree Chandler, MD  Advanced Practice Provider:  No care team member to display Electrophysiologist:  None   [id number]}   Chief Complaint  Patient presents with  . Leg Swelling    History of Present Illness:  Shirley [name] is a 80 y.o. female with history of CAD with inferior STEMI October 2015 treated with BMS to the RCA, no disease in the LAD or circumflex, low risk NST 09/27/2017 HTN, recurrent DVT on , prior CVA, prior GI bleeding with gastric ulcer 10/2018 and sleep apnea   Video visit with Dr. [name] 04/21/20 doing well.  Patient comes in with daughter. Complains of shortness of breath when she wears a double mask.3 weeks ago developed LE edema and PCP started her on Lasix 80 mg daily for 7 days then down to 20 mg daily but she's on 40 mg the past 3 days. Weight was up to 271 and down to 257 today. Husband trying to reduce salt. Drinking a hydration packet and pedialyte. Denies chest pain. Complains of pin pricking back that comes and goes-has been going on for a long time.    Past Medical History:  Diagnosis Date  . BMI 40.0-44.9, adult ([place])   . CAD in native artery, stent to RCA BMS with MI 2015 11/04/2003   a. inferior STEMI 01/2014 s/p BMS to prox RCA.  [name] DVT (deep venous thrombosis) ([place]) 2003   lower extremity DVT with questionable recurrence in 2005. Another admission 2015.  . Endometrial cancer ([name])   . Endometrial thickening on ultra sound 07/2009   path showing Fragments of benign endocervical and squamous mucosa. No dysplasia or malignancy // Followed by Dr. [name]  . First degree AV block   . Gastric ulcer   . GERD (gastroesophageal reflux disease)   . GI bleed   . Hemorrhoid   . Hyperlipidemia LDL goal <70   . Hypertension   . Insomnia 11/02/2010   . Memory difficulties 11/02/2010  . Mild dilation of ascending aorta (HCC)   . Morbid obesity ([place]) 09/16/2012  . Obstructive sleep apnea 09/23/2010  . Pre-diabetes   . Pulmonary nodule   . ST elevation myocardial infarction (STEMI) of inferior wall ([place]) 01/24/2014  . Stroke ([place]) 2015  . Syncope 12/18/2013  . Syncope, near 12/14/2018  . TIA (transient ischemic attack) 09/23/2010   09/13/2010: CT, MRI/MRA no acute process. Carotid doppler: no stenosis. 2D-Echo Normal LV function with an EF 55-60%.   . Trifascicular block   . UTI (urinary tract infection) 01/27/2014    Past Surgical History:  Procedure Laterality Date  . COLONOSCOPY W/ POLYPECTOMY  11/01/2007   8 mm rectal adenoma, hemorrhoids  . CORONARY ANGIOPLASTY WITH STENT PLACEMENT  01/24/14   Inf. STEMI, BMS to RCA  . DILATION AND CURETTAGE OF UTERUS N/A 03/28/2019   Procedure: DILATATION AND CURETTAGE OF UTERUS;  Surgeon: Lafonda Mosses, MD;  Location: [facility];  Service: Gynecology;  Laterality: N/A;  . ESOPHAGOGASTRODUODENOSCOPY N/A 10/06/2018   Procedure: ESOPHAGOGASTRODUODENOSCOPY (EGD);  Surgeon: Doran Stabler, MD;  Location: [facility];  Service: Gastroenterology;  Laterality: N/A;  . HOT HEMOSTASIS N/A 10/06/2018   Procedure: HOT HEMOSTASIS (ARGON PLASMA COAGULATION/BICAP);  Surgeon: Doran Stabler, MD;  Location: [facility];  Service: Gastroenterology;  Laterality: N/A;  . INTRAUTERINE DEVICE (IUD) INSERTION N/A 03/28/2019   Procedure: INTRAUTERINE DEVICE (IUD) INSERTION;  Surgeon: Lafonda Mosses, MD;  Location: [facility];  Service: Gynecology;  Laterality: N/A;  . KNEE SURGERY  1996   Left knee arthroscopy.  [name] LEFT HEART CATHETERIZATION WITH CORONARY ANGIOGRAM N/A 01/24/2014   Procedure: LEFT HEART CATHETERIZATION WITH CORONARY ANGIOGRAM;  Surgeon: Burnell Blanks, MD;  Location: [facility];  Service: Cardiovascular;  Laterality: N/A;  . ROBOTIC ASSISTED TOTAL HYSTERECTOMY WITH BILATERAL SALPINGO  OOPHERECTOMY N/A 09/11/2019   Procedure: XI ROBOTIC ASSISTED TOTAL HYSTERECTOMY WITH BILATERAL SALPINGO OOPHORECTOMY, LYMPH NODE DISSECTION;  Surgeon: Lafonda Mosses, MD;  Location: [facility];  Service: Gynecology;  Laterality: N/A;  . ROTATOR CUFF REPAIR     right.  [name]  10/06/2018   Procedure: [name];  Surgeon: Doran Stabler, MD;  Location: [facility];  Service: Gastroenterology;;  . [name] NODE BIOPSY N/A 09/11/2019   Procedure: SENTINEL NODE BIOPSY;  Surgeon: Lafonda Mosses, MD;  Location: [facility];  Service: Gynecology;  Laterality: N/A;  . TUBAL LIGATION      Current Medications: Current Meds  Medication Sig  . acetaminophen (TYLENOL) 500 MG tablet Take 500 mg by mouth 2 (two) times daily as needed (for pain).  [name] apixaban (ELIQUIS) 2.5 MG TABS tablet Take 1 tablet (2.5 mg total) by mouth 2 (two) times daily.  [name] aspirin EC 81 MG tablet Swallow whole. Take one tablet 3 times week.  [name] atorvastatin (LIPITOR) 80 MG tablet TAKE 1 TABLET BY MOUTH EVERY DAY  . cholecalciferol (VITAMIN D3) 25 MCG (1000 UNIT) tablet Take 1,000 Units by mouth daily.  . Ferrous Gluconate 239 (27 Fe) MG TABS Take by mouth.  . furosemide (LASIX) 40 MG tablet Take 40 mg by mouth.  . furosemide (LASIX) 40 MG tablet Take 1 tablet (40 mg total) by mouth daily.  [name] ibuprofen (ADVIL) 200 MG tablet Take 200 mg by mouth every 6 (six) hours as needed for headache or moderate pain.   [name] lisinopril (ZESTRIL) 40 MG tablet Take 40 mg by mouth daily.  . metoprolol succinate (TOPROL-XL) 25 MG 24 hr tablet TAKE 1 TABLET BY MOUTH EVERY DAY  . Multiple Vitamin (MULTIVITAMIN WITH MINERALS) TABS Take 1 tablet by mouth every morning.  . Naphazoline HCl (CLEAR EYES OP) Place 1 drop into both eyes as needed (for irritation or dryness).   . nitroGLYCERIN (NITROSTAT) 0.4 MG SL tablet Place 1 tablet (0.4 mg total) under the tongue every 5 (five) minutes x 3 doses as needed for chest pain.  . pantoprazole  (PROTONIX) 40 MG tablet TAKE 1 TABLET DAILY **SCHEDULE FOLLOW UP FOR FURTHER REFILLS**  . potassium chloride (KLOR-CON) 10 MEQ tablet Take 10 mEq by mouth daily.  . sodium chloride (OCEAN) 0.65 % SOLN nasal spray Place 1 spray into both nostrils as needed for congestion.  . [DISCONTINUED] amLODipine (NORVASC) 10 MG tablet TAKE 1 TABLET BY MOUTH EVERY DAY  . [DISCONTINUED] furosemide (LASIX) 20 MG tablet Take 20 mg by mouth daily.     Allergies:   Iohexol and Hydrochlorothiazide   Social History   Socioeconomic History  . Marital status: Married    Spouse name: Not on file  . Number of children: 6  . Years of education: College  . Highest education level: Not on file  Occupational History  . Occupation: Control and instrumentation engineer  . Occupation: [place]    Employer: [employer]  Tobacco Use  . Smoking status: Never Smoker  . Smokeless tobacco: Never Used  Vaping Use  . Vaping Use: Never used  Substance and Sexual Activity  . Alcohol use: No  . Drug use: No  . Sexual activity: Not on file  Other Topics Concern  . Not on file  Social History Narrative   Full Code status.   Insurance: BCBS   Social Determinants of Health   Financial Resource Strain: Not on file  Food Insecurity: Not on file  Transportation Needs: Not on file  Physical Activity: Not on file  Stress: Not on file  Social Connections: Not on file     Family History:  The patient's family history includes Alzheimer's disease in her maternal grandmother; Angina in her father; Dementia in her mother; Diabetes in her mother; Heart attack in her father; Heart disease in her brother and mother; Hyperlipidemia in her mother; Liver cancer in her sister; Thyroid cancer in her sister.   ROS:   Please see the history of present illness.    ROS All other systems reviewed and are negative.   PHYSICAL EXAM:   VS:  BP (!) 110/58   Pulse 70   Ht 5\' 3"  (1.6 m)   Wt 257 lb (116.6 kg)   SpO2 97%    BMI 45.53 kg/m   Physical Exam  GEN: Obese, in no acute distress  Neck: no JVD, carotid bruits, or masses Cardiac:RRR; no murmurs, rubs, or gallops  Respiratory:  clear to auscultation bilaterally, normal work of breathing GI: soft, nontender, nondistended, + BS Ext: plus 3-4 edema no cyanosis, clubbing,  Good distal pulses bilaterally MS: no deformity or atrophy  Skin: warm and dry, no rash Neuro:  Alert and Oriented x 3, Psych: euthymic mood, full affect  Wt Readings from Last 3 Encounters:  07/09/20 257 lb (116.6 kg)  04/21/20 242 lb (109.8 kg)  03/24/20 249 lb 11.2 oz (113.3 kg)      Studies/Labs Reviewed:   EKG:  EKG is not ordered today.    Recent Labs: 12/06/2019: ALT 18; BUN 12; Creatinine 0.69; Hemoglobin 12.8; Platelet Count 206; Potassium 4.0; Sodium 140   Lipid Panel    Component Value Date/Time   CHOL 129 10/17/2019 0914   TRIG 50 10/17/2019 0914   HDL 47 10/17/2019 0914   CHOLHDL 2.7 10/17/2019 0914   CHOLHDL 4 03/19/2014 0745   VLDL 22.0 03/19/2014 0745   LDLCALC 71 10/17/2019 0914    Additional studies/ records that were reviewed today include:  Echo 01/24/14:  Left ventricle: The cavity size was normal. There was mild   concentric hypertrophy. Systolic function was normal. The   estimated ejection fraction was in the range of 55% to 60%. Wall   motion was normal; there were no regional wall motion   abnormalities. - Aortic valve: There was trivial regurgitation. - Ascending aorta: The ascending aorta was mildly dilated. - Mitral valve: Calcified annulus.   Cardiac cath 01/24/14: Left main: No obstructive disease.   Left Anterior Descending Artery: Large caliber vessel that courses to the apex. Moderate caliber diagonal branch. No obstructive disease.   Circumflex Artery: Large caliber vessel with large obtuse marginal branch. No obstructive disease.   Right Coronary Artery: Moderate caliber co-dominant vessel with sub-totally occluded proximal  vessel.  Left Ventricular Angiogram: LVEF=50%      Risk Assessment/Calculations:         ASSESSMENT:    1. Coronary artery disease involving native coronary artery  of native heart without angina pectoris   2. Essential hypertension   3. History of DVT of lower extremity   4. History of CVA (cerebrovascular accident)   5. History of GI bleed   6. Obstructive sleep apnea   7. Acute diastolic heart failure (HCC)      PLAN:  In order of problems listed above:  LE edema-significant fluid overload which is new for patient. Drinking hydration packs and Pedialyte daily. 2 gm sodium diet, increase lasix 80 mg x 7 days then 40 mg daily, K 10 meq-2 a day for 7 days then one a day. Check bmet and bnp today. Echo, f/u with me in a couple weeks  CAD status post inferior STEMI 01/2014 treated with BMS to the proximal RCA, no disease in the LAD or circumflex, low risk NST 09/2017-no angina  Hypertension BP controlled  Recurrent DVTs on Eliquis  Prior CVA  History of GI bleed 10/2018-no recent problems  OSA using her CPAP   Shared Decision Making/Informed Consent        Medication Adjustments/Labs and Tests Ordered: Current medicines are reviewed at length with the patient today.  Concerns regarding medicines are outlined above.  Medication changes, Labs and Tests ordered today are listed in the Patient Instructions below. Patient Instructions    Medication Instructions:  Your physician has recommended you make the following change in your medication:   Furosemide (Lasix) 2 tablets a day (80mg ) for one week and then decrease to 40 mg daily Potassium- Take 2 tablets daily for one week and then decrease to one tablet daily  *If you need a refill on your cardiac medications before your next appointment, please call your pharmacy*   Lab Work: Today: BMET, BNP If you have labs (blood work) drawn today and your tests are completely normal, you will receive your results only  by: [name] MyChart Message (if you have MyChart) OR . A paper copy in the mail If you have any lab test that is abnormal or we need to change your treatment, we will call you to review the results.   Testing/Procedures: Your physician has requested that you have an echocardiogram. Echocardiography is a painless test that uses sound waves to create images of your heart. It provides your doctor with information about the size and shape of your heart and how well your heart's chambers and valves are working. This procedure takes approximately one hour. There are no restrictions for this procedure.    Follow-Up: At [facility], you and your health needs are our priority.  As part of our continuing mission to provide you with exceptional heart care, we have created designated Provider Care Teams.  These Care Teams include your primary Cardiologist (physician) and Advanced Practice Providers (APPs -  Physician Assistants and Nurse Practitioners) who all work together to provide you with the care you need, when you need it.   Your next appointment:   08/13/2020 at 1:45pm   The format for your next appointment:   In Person  Provider:   Ermalinda Barrios, PA-C   Other Instructions Two Gram Sodium Diet 2000 mg  What is Sodium? Sodium is a mineral found naturally in many foods. The most significant source of sodium in the diet is table salt, which is about 40% sodium.  Processed, convenience, and preserved foods also contain a large amount of sodium.  The body needs only 500 mg of sodium daily to function,  A normal diet provides more than enough sodium even  if you do not use salt.  Why Limit Sodium? A build up of sodium in the body can cause thirst, increased blood pressure, shortness of breath, and water retention.  Decreasing sodium in the diet can reduce edema and risk of heart attack or stroke associated with high blood pressure.  Keep in mind that there are many other factors involved in these  health problems.  Heredity, obesity, lack of exercise, cigarette smoking, stress and what you eat all play a role.  General Guidelines:  Do not add salt at the table or in cooking.  One teaspoon of salt contains over 2 grams of sodium.  Read food labels  Avoid processed and convenience foods  Ask your dietitian before eating any foods not dicussed in the menu planning guidelines  Consult your physician if you wish to use a salt substitute or a sodium containing medication such as antacids.  Limit milk and milk products to 16 oz (2 cups) per day.  Shopping Hints:  READ LABELS!! "Dietetic" does not necessarily mean low sodium.  Salt and other sodium ingredients are often added to foods during processing.   Menu Planning Guidelines Food Group Choose More Often Avoid  Beverages (see also the milk group All fruit juices, low-sodium, salt-free vegetables juices, low-sodium carbonated beverages Regular vegetable or tomato juices, commercially softened water used for drinking or cooking  Breads and Cereals Enriched white, wheat, rye and pumpernickel bread, hard rolls and dinner rolls; muffins, cornbread and waffles; most dry cereals, cooked cereal without added salt; unsalted crackers and breadsticks; low sodium or homemade bread crumbs Bread, rolls and crackers with salted tops; quick breads; instant hot cereals; pancakes; commercial bread stuffing; self-rising flower and biscuit mixes; regular bread crumbs or cracker crumbs  Desserts and Sweets Desserts and sweets mad with mild should be within allowance Instant pudding mixes and cake mixes  Fats Butter or margarine; vegetable oils; unsalted salad dressings, regular salad dressings limited to 1 Tbs; light, sour and heavy cream Regular salad dressings containing bacon fat, bacon bits, and salt pork; snack dips made with instant soup mixes or processed cheese; salted nuts  Fruits Most fresh, frozen and canned fruits Fruits processed with salt or  sodium-containing ingredient (some dried fruits are processed with sodium sulfites        Vegetables Fresh, frozen vegetables and low- sodium canned vegetables Regular canned vegetables, sauerkraut, pickled vegetables, and others prepared in brine; frozen vegetables in sauces; vegetables seasoned with ham, bacon or salt pork  Condiments, Sauces, Miscellaneous  Salt substitute with physician's approval; pepper, herbs, spices; vinegar, lemon or lime juice; hot pepper sauce; garlic powder, onion powder, low sodium soy sauce (1 Tbs.); low sodium condiments (ketchup, chili sauce, mustard) in limited amounts (1 tsp.) fresh ground horseradish; unsalted tortilla chips, pretzels, potato chips, popcorn, salsa (1/4 cup) Any seasoning made with salt including garlic salt, celery salt, onion salt, and seasoned salt; sea salt, rock salt, kosher salt; meat tenderizers; monosodium glutamate; mustard, regular soy sauce, barbecue, sauce, chili sauce, teriyaki sauce, steak sauce, Worcestershire sauce, and most flavored vinegars; canned gravy and mixes; regular condiments; salted snack foods, olives, picles, relish, horseradish sauce, catsup   Food preparation: Try these seasonings Meats:    Pork Sage, onion Serve with applesauce  Chicken Poultry seasoning, thyme, parsley Serve with cranberry sauce  Lamb Curry powder, rosemary, garlic, thyme Serve with mint sauce or jelly  Veal Marjoram, basil Serve with current jelly, cranberry sauce  Beef Pepper, bay leaf Serve with dry mustard,  unsalted chive butter  Fish [place] leaf, dill Serve with unsalted lemon butter, unsalted parsley butter  Vegetables:    Asparagus Lemon juice   Broccoli Lemon juice   Carrots Mustard dressing parsley, mint, nutmeg, glazed with unsalted butter and sugar   Green beans Marjoram, lemon juice, nutmeg,dill seed   Tomatoes Basil, marjoram, onion   Spice /blend for [employer]" 4 tsp ground thyme 1 tsp ground sage 3 tsp ground rosemary 4 tsp  ground marjoram   Test your knowledge 1. A product that says "Salt Free" may still contain sodium. True or False 2. Garlic Powder and Hot Pepper Sauce an be used as alternative seasonings.True or False 3. Processed foods have more sodium than fresh foods.  True or False 4. Canned Vegetables have less sodium than froze True or False  WAYS TO DECREASE YOUR SODIUM INTAKE 1. Avoid the use of added salt in cooking and at the table.  Table salt (and other prepared seasonings which contain salt) is probably one of the greatest sources of sodium in the diet.  Unsalted foods can gain flavor from the sweet, sour, and butter taste sensations of herbs and spices.  Instead of using salt for seasoning, try the following seasonings with the foods listed.  Remember: how you use them to enhance natural food flavors is limited only by your creativity... Allspice-Meat, fish, eggs, fruit, peas, red and yellow vegetables Almond Extract-Fruit baked goods Anise Seed-Sweet breads, fruit, carrots, beets, cottage cheese, cookies (tastes like licorice) Basil-Meat, fish, eggs, vegetables, rice, vegetables salads, soups, sauces Bay Leaf-Meat, fish, stews, poultry Burnet-Salad, vegetables (cucumber-like flavor) Caraway Seed-Bread, cookies, cottage cheese, meat, vegetables, cheese, rice Cardamon-Baked goods, fruit, soups Celery Powder or seed-Salads, salad dressings, sauces, meatloaf, soup, bread.Do not use  celery salt Chervil-Meats, salads, fish, eggs, vegetables, cottage cheese (parsley-like flavor) Chili Power-Meatloaf, chicken cheese, corn, eggplant, egg dishes Chives-Salads cottage cheese, egg dishes, soups, vegetables, sauces Cilantro-Salsa, casseroles Cinnamon-Baked goods, fruit, pork, lamb, chicken, carrots Cloves-Fruit, baked goods, fish, pot roast, green beans, beets, carrots Coriander-Pastry, cookies, meat, salads, cheese (lemon-orange flavor) Cumin-Meatloaf, fish,cheese, eggs, cabbage,fruit pie (caraway  flavor) [employer], fruit, eggs, fish, poultry, cottage cheese, vegetables Dill Seed-Meat, cottage cheese, poultry, vegetables, fish, salads, bread Fennel Seed-Bread, cookies, apples, pork, eggs, fish, beets, cabbage, cheese, Licorice-like flavor Garlic-(buds or powder) Salads, meat, poultry, fish, bread, butter, vegetables, potatoes.Do not  use garlic salt Ginger-Fruit, vegetables, baked goods, meat, fish, poultry Horseradish Root-Meet, vegetables, butter Lemon Juice or Extract-Vegetables, fruit, tea, baked goods, fish salads Mace-Baked goods fruit, vegetables, fish, poultry (taste like nutmeg) Maple Extract-Syrups Marjoram-Meat, chicken, fish, vegetables, breads, green salads (taste like Sage) Mint-Tea, lamb, sherbet, vegetables, desserts, carrots, cabbage Mustard, Dry or Seed-Cheese, eggs, meats, vegetables, poultry Nutmeg-Baked goods, fruit, chicken, eggs, vegetables, desserts Onion Powder-Meat, fish, poultry, vegetables, cheese, eggs, bread, rice salads (Do not use   Onion salt) Orange Extract-Desserts, baked goods Oregano-Pasta, eggs, cheese, onions, pork, lamb, fish, chicken, vegetables, green salads Paprika-Meat, fish, poultry, eggs, cheese, vegetables Parsley Flakes-Butter, vegetables, meat fish, poultry, eggs, bread, salads (certain forms may   Contain sodium Pepper-Meat fish, poultry, vegetables, eggs Peppermint Extract-Desserts, baked goods Poppy Seed-Eggs, bread, cheese, fruit dressings, baked goods, noodles, vegetables, cottage  [employer], poultry, meat, fish, cauliflower, turnips,eggs bread Saffron-Rice, bread, veal, chicken, fish, eggs Sage-Meat, fish, poultry, onions, eggplant, tomateos, pork, stews Savory-Eggs, salads, poultry, meat, rice, vegetables, soups, pork Tarragon-Meat, poultry, fish, eggs, butter, vegetables (licorice-like flavor)  Thyme-Meat, poultry, fish, eggs, vegetables, (clover-like flavor), sauces,  soups Tumeric-Salads, butter,  eggs, fish, rice, vegetables (saffron-like flavor) Vanilla Extract-Baked goods, candy Vinegar-Salads, vegetables, meat marinades Walnut Extract-baked goods, candy  2. Choose your Foods Wisely   The following is a list of foods to avoid which are high in sodium:  Meats-Avoid all smoked, canned, salt cured, dried and kosher meat and fish as well as Anchovies   [employer] meats:Bologna, Liverwurst, Pastrami Canned meat or fish  Marinated herring Caviar    Pepperoni Corned Beef   Pizza Dried chipped beef  Salami Frozen breaded fish or meat Salt pork Frankfurters or hot dogs  Sardines Gefilte fish   Sausage Ham (boiled ham, Proscuitto Smoked butt    spiced ham)   Spam      TV Dinners Vegetables Canned vegetables (Regular) Relish Canned mushrooms  Sauerkraut Olives    Tomato juice Pickles  Bakery and Dessert Products Canned puddings  Cream pies Cheesecake   Decorated cakes Cookies  Beverages/Juices Tomato juice, regular  Gatorade   V-8 vegetable juice, regular  Breads and Cereals Biscuit mixes   Salted potato chips, corn chips, pretzels Bread stuffing mixes  Salted crackers and rolls Pancake and waffle mixes Self-rising flour  Seasonings Accent    Meat sauces Barbecue sauce  Meat tenderizer Catsup    Monosodium glutamate (MSG) Celery salt   Onion salt Chili sauce   Prepared mustard Garlic salt   Salt, seasoned salt, sea salt Gravy mixes   Soy sauce Horseradish   Steak sauce Ketchup   Tartar sauce Lite salt    Teriyaki sauce Marinade mixes   Worcestershire sauce  Others Baking powder   Cocoa and cocoa mixes Baking soda   Commercial casserole mixes Candy-caramels, chocolate  Dehydrated soups    Bars, fudge,nougats  Instant rice and pasta mixes Canned broth or soup  Maraschino cherries Cheese, aged and processed cheese and cheese spreads  Learning Assessment Quiz  Indicated T (for True) or F (for False) for each of the  following statements:  1. _____ Fresh fruits and vegetables and unprocessed grains are generally low in sodium 2. _____ Water may contain a considerable amount of sodium, depending on the source 3. _____ You can always tell if a food is high in sodium by tasting it 4. _____ Certain laxatives my be high in sodium and should be avoided unless prescribed   by a physician or pharmacist 5. _____ Salt substitutes may be used freely by anyone on a sodium restricted diet 6. _____ Sodium is present in table salt, food additives and as a natural component of   most foods 7. _____ Table salt is approximately 90% sodium 8. _____ Limiting sodium intake may help prevent excess fluid accumulation in the body 9. _____ On a sodium-restricted diet, seasonings such as bouillon soy sauce, and    cooking wine should be used in place of table salt 10. _____ On an ingredient list, a product which lists monosodium glutamate as the first   ingredient is an appropriate food to include on a low sodium diet  Circle the best answer(s) to the following statements (Hint: there may be more than one correct answer)  11. On a low-sodium diet, some acceptable snack items are:    A. Olives  F. Bean dip   K. Grapefruit juice    B. Salted Pretzels G. Commercial Popcorn   L. Canned peaches    C. Carrot Sticks  H. Bouillon   M. Unsalted nuts   D. [place] fries  I. Peanut butter crackers  N. Salami   E. Sweet pickles J. Tomato Juice   O. Pizza  12.  Seasonings that may be used freely on a reduced - sodium diet include   A. Lemon wedges F.Monosodium glutamate K. Celery seed    B.Soysauce   G. Pepper   L. Mustard powder   C. Sea salt  H. Cooking wine  M. Onion flakes   D. Vinegar  E. Prepared horseradish N. Salsa   E. Sage   J. Worcestershire sauce  O. Chutney      Signed, Ermalinda Barrios, PA-C  07/09/2020 8:40 AM    [facility] [address], [place], [place]  [address] Phone: [phone];  Fax: [phone]

## 2020-07-09 ENCOUNTER — Ambulatory Visit (INDEPENDENT_AMBULATORY_CARE_PROVIDER_SITE_OTHER): Payer: Medicare Other | Admitting: Physician Assistant

## 2020-07-09 ENCOUNTER — Other Ambulatory Visit: Payer: Self-pay

## 2020-07-09 ENCOUNTER — Encounter: Payer: Self-pay | Admitting: Physician Assistant

## 2020-07-09 VITALS — BP 110/58 | HR 70 | Ht 63.0 in | Wt 257.0 lb

## 2020-07-09 DIAGNOSIS — Z8719 Personal history of other diseases of the digestive system: Secondary | ICD-10-CM | POA: Diagnosis not present

## 2020-07-09 DIAGNOSIS — I5032 Chronic diastolic (congestive) heart failure: Secondary | ICD-10-CM | POA: Diagnosis not present

## 2020-07-09 DIAGNOSIS — I10 Essential (primary) hypertension: Secondary | ICD-10-CM | POA: Diagnosis not present

## 2020-07-09 DIAGNOSIS — Z8673 Personal history of transient ischemic attack (TIA), and cerebral infarction without residual deficits: Secondary | ICD-10-CM | POA: Diagnosis not present

## 2020-07-09 DIAGNOSIS — I5031 Acute diastolic (congestive) heart failure: Secondary | ICD-10-CM | POA: Diagnosis not present

## 2020-07-09 DIAGNOSIS — I2510 Atherosclerotic heart disease of native coronary artery without angina pectoris: Secondary | ICD-10-CM

## 2020-07-09 DIAGNOSIS — G4733 Obstructive sleep apnea (adult) (pediatric): Secondary | ICD-10-CM

## 2020-07-09 DIAGNOSIS — Z86718 Personal history of other venous thrombosis and embolism: Secondary | ICD-10-CM

## 2020-07-09 MED ORDER — AMLODIPINE BESYLATE 10 MG PO TABS: 1.0000 | ORAL_TABLET | Freq: Every day | ORAL | 3 refills | Status: DC

## 2020-07-09 MED ORDER — FUROSEMIDE 40 MG PO TABS
40.0000 mg | ORAL_TABLET | Freq: Every day | ORAL | 3 refills | Status: DC
Start: 2020-07-09 — End: 2020-08-13

## 2020-07-09 NOTE — Patient Instructions (Addendum)
Medication Instructions:  Your physician has recommended you make the following change in your medication:   Furosemide (Lasix) 2 tablets a day (80mg ) for one week and then decrease to 40 mg daily Potassium- Take 2 tablets daily for one week and then decrease to one tablet daily  *If you need a refill on your cardiac medications before your next appointment, please call your pharmacy*   Lab Work: Today: BMET, BNP If you have labs (blood work) drawn today and your tests are completely normal, you will receive your results only by: [name] MyChart Message (if you have MyChart) OR . A paper copy in the mail If you have any lab test that is abnormal or we need to change your treatment, we will call you to review the results.   Testing/Procedures: Your physician has requested that you have an echocardiogram. Echocardiography is a painless test that uses sound waves to create images of your heart. It provides your doctor with information about the size and shape of your heart and how well your heart's chambers and valves are working. This procedure takes approximately one hour. There are no restrictions for this procedure.    Follow-Up: At [facility], you and your health needs are our priority.  As part of our continuing mission to provide you with exceptional heart care, we have created designated Provider Care Teams.  These Care Teams include your primary Cardiologist (physician) and Advanced Practice Providers (APPs -  Physician Assistants and Nurse Practitioners) who all work together to provide you with the care you need, when you need it.   Your next appointment:   08/13/2020 at 1:45pm   The format for your next appointment:   In Person  Provider:   Ermalinda Barrios, PA-C   Other Instructions Two Gram Sodium Diet 2000 mg  What is Sodium? Sodium is a mineral found naturally in many foods. The most significant source of sodium in the diet is table salt, which is about 40% sodium.   Processed, convenience, and preserved foods also contain a large amount of sodium.  The body needs only 500 mg of sodium daily to function,  A normal diet provides more than enough sodium even if you do not use salt.  Why Limit Sodium? A build up of sodium in the body can cause thirst, increased blood pressure, shortness of breath, and water retention.  Decreasing sodium in the diet can reduce edema and risk of heart attack or stroke associated with high blood pressure.  Keep in mind that there are many other factors involved in these health problems.  Heredity, obesity, lack of exercise, cigarette smoking, stress and what you eat all play a role.  General Guidelines:  Do not add salt at the table or in cooking.  One teaspoon of salt contains over 2 grams of sodium.  Read food labels  Avoid processed and convenience foods  Ask your dietitian before eating any foods not dicussed in the menu planning guidelines  Consult your physician if you wish to use a salt substitute or a sodium containing medication such as antacids.  Limit milk and milk products to 16 oz (2 cups) per day.  Shopping Hints:  READ LABELS!! "Dietetic" does not necessarily mean low sodium.  Salt and other sodium ingredients are often added to foods during processing.   Menu Planning Guidelines Food Group Choose More Often Avoid  Beverages (see also the milk group All fruit juices, low-sodium, salt-free vegetables juices, low-sodium carbonated beverages Regular vegetable or tomato  juices, commercially softened water used for drinking or cooking  Breads and Cereals Enriched white, wheat, rye and pumpernickel bread, hard rolls and dinner rolls; muffins, cornbread and waffles; most dry cereals, cooked cereal without added salt; unsalted crackers and breadsticks; low sodium or homemade bread crumbs Bread, rolls and crackers with salted tops; quick breads; instant hot cereals; pancakes; commercial bread stuffing; self-rising  flower and biscuit mixes; regular bread crumbs or cracker crumbs  Desserts and Sweets Desserts and sweets mad with mild should be within allowance Instant pudding mixes and cake mixes  Fats Butter or margarine; vegetable oils; unsalted salad dressings, regular salad dressings limited to 1 Tbs; light, sour and heavy cream Regular salad dressings containing bacon fat, bacon bits, and salt pork; snack dips made with instant soup mixes or processed cheese; salted nuts  Fruits Most fresh, frozen and canned fruits Fruits processed with salt or sodium-containing ingredient (some dried fruits are processed with sodium sulfites        Vegetables Fresh, frozen vegetables and low- sodium canned vegetables Regular canned vegetables, sauerkraut, pickled vegetables, and others prepared in brine; frozen vegetables in sauces; vegetables seasoned with ham, bacon or salt pork  Condiments, Sauces, Miscellaneous  Salt substitute with physician's approval; pepper, herbs, spices; vinegar, lemon or lime juice; hot pepper sauce; garlic powder, onion powder, low sodium soy sauce (1 Tbs.); low sodium condiments (ketchup, chili sauce, mustard) in limited amounts (1 tsp.) fresh ground horseradish; unsalted tortilla chips, pretzels, potato chips, popcorn, salsa (1/4 cup) Any seasoning made with salt including garlic salt, celery salt, onion salt, and seasoned salt; sea salt, rock salt, kosher salt; meat tenderizers; monosodium glutamate; mustard, regular soy sauce, barbecue, sauce, chili sauce, teriyaki sauce, steak sauce, Worcestershire sauce, and most flavored vinegars; canned gravy and mixes; regular condiments; salted snack foods, olives, picles, relish, horseradish sauce, catsup   Food preparation: Try these seasonings Meats:    Pork Sage, onion Serve with applesauce  Chicken Poultry seasoning, thyme, parsley Serve with cranberry sauce  Lamb Curry powder, rosemary, garlic, thyme Serve with mint sauce or jelly  Veal  Marjoram, basil Serve with current jelly, cranberry sauce  Beef Pepper, bay leaf Serve with dry mustard, unsalted chive butter  Fish [place] leaf, dill Serve with unsalted lemon butter, unsalted parsley butter  Vegetables:    Asparagus Lemon juice   Broccoli Lemon juice   Carrots Mustard dressing parsley, mint, nutmeg, glazed with unsalted butter and sugar   Green beans Marjoram, lemon juice, nutmeg,dill seed   Tomatoes Basil, marjoram, onion   Spice /blend for [employer]" 4 tsp ground thyme 1 tsp ground sage 3 tsp ground rosemary 4 tsp ground marjoram   Test your knowledge 1. A product that says "Salt Free" may still contain sodium. True or False 2. Garlic Powder and Hot Pepper Sauce an be used as alternative seasonings.True or False 3. Processed foods have more sodium than fresh foods.  True or False 4. Canned Vegetables have less sodium than froze True or False  WAYS TO DECREASE YOUR SODIUM INTAKE 1. Avoid the use of added salt in cooking and at the table.  Table salt (and other prepared seasonings which contain salt) is probably one of the greatest sources of sodium in the diet.  Unsalted foods can gain flavor from the sweet, sour, and butter taste sensations of herbs and spices.  Instead of using salt for seasoning, try the following seasonings with the foods listed.  Remember: how you use them to enhance natural food  flavors is limited only by your creativity... Allspice-Meat, fish, eggs, fruit, peas, red and yellow vegetables Almond Extract-Fruit baked goods Anise Seed-Sweet breads, fruit, carrots, beets, cottage cheese, cookies (tastes like licorice) Basil-Meat, fish, eggs, vegetables, rice, vegetables salads, soups, sauces Bay Leaf-Meat, fish, stews, poultry Burnet-Salad, vegetables (cucumber-like flavor) Caraway Seed-Bread, cookies, cottage cheese, meat, vegetables, cheese, rice Cardamon-Baked goods, fruit, soups Celery Powder or seed-Salads, salad dressings, sauces, meatloaf,  soup, bread.Do not use  celery salt Chervil-Meats, salads, fish, eggs, vegetables, cottage cheese (parsley-like flavor) Chili Power-Meatloaf, chicken cheese, corn, eggplant, egg dishes Chives-Salads cottage cheese, egg dishes, soups, vegetables, sauces Cilantro-Salsa, casseroles Cinnamon-Baked goods, fruit, pork, lamb, chicken, carrots Cloves-Fruit, baked goods, fish, pot roast, green beans, beets, carrots Coriander-Pastry, cookies, meat, salads, cheese (lemon-orange flavor) Cumin-Meatloaf, fish,cheese, eggs, cabbage,fruit pie (caraway flavor) [employer], fruit, eggs, fish, poultry, cottage cheese, vegetables Dill Seed-Meat, cottage cheese, poultry, vegetables, fish, salads, bread Fennel Seed-Bread, cookies, apples, pork, eggs, fish, beets, cabbage, cheese, Licorice-like flavor Garlic-(buds or powder) Salads, meat, poultry, fish, bread, butter, vegetables, potatoes.Do not  use garlic salt Ginger-Fruit, vegetables, baked goods, meat, fish, poultry Horseradish Root-Meet, vegetables, butter Lemon Juice or Extract-Vegetables, fruit, tea, baked goods, fish salads Mace-Baked goods fruit, vegetables, fish, poultry (taste like nutmeg) Maple Extract-Syrups Marjoram-Meat, chicken, fish, vegetables, breads, green salads (taste like Sage) Mint-Tea, lamb, sherbet, vegetables, desserts, carrots, cabbage Mustard, Dry or Seed-Cheese, eggs, meats, vegetables, poultry Nutmeg-Baked goods, fruit, chicken, eggs, vegetables, desserts Onion Powder-Meat, fish, poultry, vegetables, cheese, eggs, bread, rice salads (Do not use   Onion salt) Orange Extract-Desserts, baked goods Oregano-Pasta, eggs, cheese, onions, pork, lamb, fish, chicken, vegetables, green salads Paprika-Meat, fish, poultry, eggs, cheese, vegetables Parsley Flakes-Butter, vegetables, meat fish, poultry, eggs, bread, salads (certain forms may   Contain sodium Pepper-Meat fish, poultry, vegetables, eggs Peppermint Extract-Desserts, baked  goods Poppy Seed-Eggs, bread, cheese, fruit dressings, baked goods, noodles, vegetables, cottage  [employer], poultry, meat, fish, cauliflower, turnips,eggs bread Saffron-Rice, bread, veal, chicken, fish, eggs Sage-Meat, fish, poultry, onions, eggplant, tomateos, pork, stews Savory-Eggs, salads, poultry, meat, rice, vegetables, soups, pork Tarragon-Meat, poultry, fish, eggs, butter, vegetables (licorice-like flavor)  Thyme-Meat, poultry, fish, eggs, vegetables, (clover-like flavor), sauces, soups Tumeric-Salads, butter, eggs, fish, rice, vegetables (saffron-like flavor) Vanilla Extract-Baked goods, candy Vinegar-Salads, vegetables, meat marinades Walnut Extract-baked goods, candy  2. Choose your Foods Wisely   The following is a list of foods to avoid which are high in sodium:  Meats-Avoid all smoked, canned, salt cured, dried and kosher meat and fish as well as Anchovies   [employer] meats:Bologna, Liverwurst, Pastrami Canned meat or fish  Marinated herring Caviar    Pepperoni Corned Beef   Pizza Dried chipped beef  Salami Frozen breaded fish or meat Salt pork Frankfurters or hot dogs  Sardines Gefilte fish   Sausage Ham (boiled ham, Proscuitto Smoked butt    spiced ham)   Spam      TV Dinners Vegetables Canned vegetables (Regular) Relish Canned mushrooms  Sauerkraut Olives    Tomato juice Pickles  Bakery and Dessert Products Canned puddings  Cream pies Cheesecake   Decorated cakes Cookies  Beverages/Juices Tomato juice, regular  Gatorade   V-8 vegetable juice, regular  Breads and Cereals Biscuit mixes   Salted potato chips, corn chips, pretzels Bread stuffing mixes  Salted crackers and rolls Pancake and waffle mixes Self-rising flour  Seasonings Accent    Meat sauces Barbecue sauce  Meat tenderizer Catsup    Monosodium glutamate (MSG) Celery salt  Onion salt Chili sauce   Prepared mustard Garlic  salt   Salt, seasoned salt, sea salt Gravy mixes   Soy sauce Horseradish   Steak sauce Ketchup   Tartar sauce Lite salt    Teriyaki sauce Marinade mixes   Worcestershire sauce  Others Baking powder   Cocoa and cocoa mixes Baking soda   Commercial casserole mixes Candy-caramels, chocolate  Dehydrated soups    Bars, fudge,nougats  Instant rice and pasta mixes Canned broth or soup  Maraschino cherries Cheese, aged and processed cheese and cheese spreads  Learning Assessment Quiz  Indicated T (for True) or F (for False) for each of the following statements:  1. _____ Fresh fruits and vegetables and unprocessed grains are generally low in sodium 2. _____ Water may contain a considerable amount of sodium, depending on the source 3. _____ You can always tell if a food is high in sodium by tasting it 4. _____ Certain laxatives my be high in sodium and should be avoided unless prescribed   by a physician or pharmacist 5. _____ Salt substitutes may be used freely by anyone on a sodium restricted diet 6. _____ Sodium is present in table salt, food additives and as a natural component of   most foods 7. _____ Table salt is approximately 90% sodium 8. _____ Limiting sodium intake may help prevent excess fluid accumulation in the body 9. _____ On a sodium-restricted diet, seasonings such as bouillon soy sauce, and    cooking wine should be used in place of table salt 10. _____ On an ingredient list, a product which lists monosodium glutamate as the first   ingredient is an appropriate food to include on a low sodium diet  Circle the best answer(s) to the following statements (Hint: there may be more than one correct answer)  11. On a low-sodium diet, some acceptable snack items are:    A. Olives  F. Bean dip   K. Grapefruit juice    B. Salted Pretzels G. Commercial Popcorn   L. Canned peaches    C. Carrot Sticks  H. Bouillon   M. Unsalted nuts   D. [place] fries  I. Peanut butter crackers N.  Salami   E. Sweet pickles J. Tomato Juice   O. Pizza  12.  Seasonings that may be used freely on a reduced - sodium diet include   A. Lemon wedges F.Monosodium glutamate K. Celery seed    B.Soysauce   G. Pepper   L. Mustard powder   C. Sea salt  H. Cooking wine  M. Onion flakes   D. Vinegar  E. Prepared horseradish N. Salsa   E. Sage   J. Worcestershire sauce  O. Chutney

## 2020-07-10 LAB — BASIC METABOLIC PANEL
BUN/Creatinine Ratio: 18 (ref 12–28)
BUN: 12 mg/dL (ref 8–27)
CO2: 21 mmol/L (ref 20–29)
Calcium: 9.2 mg/dL (ref 8.7–10.3)
Chloride: 101 mmol/L (ref 96–106)
Creatinine, Ser: 0.65 mg/dL (ref 0.57–1.00)
Glucose: 116 mg/dL — ABNORMAL HIGH (ref 65–99)
Potassium: 4.2 mmol/L (ref 3.5–5.2)
Sodium: 137 mmol/L (ref 134–144)
eGFR: 89 mL/min/{1.73_m2} (ref >59)

## 2020-07-10 LAB — PRO B NATRIURETIC PEPTIDE: NT-Pro BNP: 64 pg/mL (ref 0–738)

## 2020-08-01 DIAGNOSIS — R252 Cramp and spasm: Secondary | ICD-10-CM | POA: Diagnosis not present

## 2020-08-02 ENCOUNTER — Other Ambulatory Visit: Payer: Self-pay | Admitting: Gastroenterology

## 2020-08-05 NOTE — Progress Notes (Signed)
Cardiology Office Note    Date:  08/13/2020   ID:  Shirley [name], [place] [birth year], MRN [id number]   PCP:  Merrilee Seashore, [place]  Cardiologist:  Lauree Chandler, MD  Advanced Practice Provider:  No care team member to display Electrophysiologist:  None   [id number]   Chief Complaint  Patient presents with  . Follow-up    History of Present Illness:  Shirley [name] is a 80 y.o. female with history of CAD with inferior STEMI October 2015 treated with BMS to the RCA, no disease in the LAD or circumflex, low risk NST 09/27/2017 HTN, recurrent DVT on , prior CVA, prior GI bleeding with gastric ulcer 10/2018 and sleep apnea    I saw the patient 07/09/20 with fluid overload. She was drinking a hydration pack and pedialyte daily. I increased lasix 80 mg for 7 days and K. Echo ordered-normal LVEF 60-65% grade 2 DD.BNP was 64, Crt 0.65.  Patient comes in accompanied by her daughter. Has cut back on salt and wears compression stockings at home. Weight up 5 lbs. But clothes are fitting loser.Says her scale at home went from 258 lbs to 241 lbs. Having left knee pain and difficulty walking.    Past Medical History:  Diagnosis Date  . BMI 40.0-44.9, adult ([place])   . CAD in native artery, stent to RCA BMS with MI 2015 11/04/2003   a. inferior STEMI 01/2014 s/p BMS to prox RCA.  [name] DVT (deep venous thrombosis) ([place]) 2003   lower extremity DVT with questionable recurrence in 2005. Another admission 2015.  . Endometrial cancer ([place])   . Endometrial thickening on ultra sound 07/2009   path showing Fragments of benign endocervical and squamous mucosa. No dysplasia or malignancy // Followed by Dr. [name]  . First degree AV block   . Gastric ulcer   . GERD (gastroesophageal reflux disease)   . GI bleed   . Hemorrhoid   . Hyperlipidemia LDL goal <70   . Hypertension   . Insomnia 11/02/2010  . Memory difficulties 11/02/2010  . Mild  dilation of ascending aorta ([facility])   . Morbid obesity ([place]) 09/16/2012  . Obstructive sleep apnea 09/23/2010  . Pre-diabetes   . Pulmonary nodule   . ST elevation myocardial infarction (STEMI) of inferior wall ([place]) 01/24/2014  . Stroke ([place]) 2015  . Syncope 12/18/2013  . Syncope, near 12/14/2018  . TIA (transient ischemic attack) 09/23/2010   09/13/2010: CT, MRI/MRA no acute process. Carotid doppler: no stenosis. 2D-Echo Normal LV function with an EF 55-60%.   . Trifascicular block   . UTI (urinary tract infection) 01/27/2014    Past Surgical History:  Procedure Laterality Date  . COLONOSCOPY W/ POLYPECTOMY  11/01/2007   8 mm rectal adenoma, hemorrhoids  . CORONARY ANGIOPLASTY WITH STENT PLACEMENT  01/24/14   Inf. STEMI, BMS to RCA  . DILATION AND CURETTAGE OF UTERUS N/A 03/28/2019   Procedure: DILATATION AND CURETTAGE OF UTERUS;  Surgeon: Lafonda Mosses, MD;  Location: [facility];  Service: Gynecology;  Laterality: N/A;  . ESOPHAGOGASTRODUODENOSCOPY N/A 10/06/2018   Procedure: ESOPHAGOGASTRODUODENOSCOPY (EGD);  Surgeon: Doran Stabler, MD;  Location: [facility];  Service: Gastroenterology;  Laterality: N/A;  . HOT HEMOSTASIS N/A 10/06/2018   Procedure: HOT HEMOSTASIS (ARGON PLASMA COAGULATION/BICAP);  Surgeon: Doran Stabler, MD;  Location: [facility];  Service: Gastroenterology;  Laterality: N/A;  . INTRAUTERINE DEVICE (IUD) INSERTION N/A 03/28/2019  Procedure: INTRAUTERINE DEVICE (IUD) INSERTION;  Surgeon: Lafonda Mosses, MD;  Location: [facility];  Service: Gynecology;  Laterality: N/A;  . KNEE SURGERY  1996   Left knee arthroscopy.  [name] LEFT HEART CATHETERIZATION WITH CORONARY ANGIOGRAM N/A 01/24/2014   Procedure: LEFT HEART CATHETERIZATION WITH CORONARY ANGIOGRAM;  Surgeon: Burnell Blanks, MD;  Location[facility] C[facility] LAB;  Service: Cardiovascular;  Laterality: N/A;  . ROBOTIC ASSISTED TOTAL HYSTERECTOMY WITH BILATERAL SALPINGO OOPHERECTOMY N/A 09/11/2019   Procedure: XI  ROBOTIC ASSISTED TOTAL HYSTERECTOMY WITH BILATERAL SALPINGO OOPHORECTOMY, LYMPH NODE DISSECTION;  Surgeon: Lafonda Mosses, MD;  Location: [facility];  Service: Gynecology;  Laterality: N/A;  . ROTATOR CUFF REPAIR     right.  [name]  10/06/2018   Procedure: [name];  Surgeon: Doran Stabler, MD;  Location: [facility] ENDOSCOPY;  Service: Gastroenterology;;  . [name] NODE BIOPSY N/A 09/11/2019   Procedure: SENTINEL NODE BIOPSY;  Surgeon: Lafonda Mosses, MD;  Location: [facility];  Service: Gynecology;  Laterality: N/A;  . TUBAL LIGATION      Current Medications: Current Meds  Medication Sig  . acetaminophen (TYLENOL) 500 MG tablet Take 500 mg by mouth 2 (two) times daily as needed (for pain).  [name] amLODipine (NORVASC) 10 MG tablet Take 1 tablet (10 mg total) by mouth daily.  [name] apixaban (ELIQUIS) 2.5 MG TABS tablet Take 1 tablet (2.5 mg total) by mouth 2 (two) times daily.  [name] aspirin EC 81 MG tablet Swallow whole. Take one tablet 3 times week.  [name] atorvastatin (LIPITOR) 80 MG tablet TAKE 1 TABLET BY MOUTH EVERY DAY  . cholecalciferol (VITAMIN D3) 25 MCG (1000 UNIT) tablet Take 1,000 Units by mouth daily.  . Ferrous Gluconate 239 (27 Fe) MG TABS Take by mouth.  . furosemide (LASIX) 40 MG tablet Take 40 mg by mouth.  [name] ibuprofen (ADVIL) 200 MG tablet Take 200 mg by mouth every 6 (six) hours as needed for headache or moderate pain.   [name] lisinopril (ZESTRIL) 40 MG tablet Take 40 mg by mouth daily.  . metoprolol succinate (TOPROL-XL) 25 MG 24 hr tablet TAKE 1 TABLET BY MOUTH EVERY DAY  . Multiple Vitamin (MULTIVITAMIN WITH MINERALS) TABS Take 1 tablet by mouth every morning.  . Naphazoline HCl (CLEAR EYES OP) Place 1 drop into both eyes as needed (for irritation or dryness).   . nitroGLYCERIN (NITROSTAT) 0.4 MG SL tablet Place 1 tablet (0.4 mg total) under the tongue every 5 (five) minutes x 3 doses as needed for chest pain.  . pantoprazole (PROTONIX) 40 MG tablet TAKE 1 TABLET DAILY  **SCHEDULE FOLLOW UP FOR FURTHER REFILLS**  . potassium chloride (KLOR-CON) 10 MEQ tablet Take 10 mEq by mouth daily.  . sodium chloride (OCEAN) 0.65 % SOLN nasal spray Place 1 spray into both nostrils as needed for congestion.     Allergies:   Iohexol and Hydrochlorothiazide   Social History   Socioeconomic History  . Marital status: Married    Spouse name: Not on file  . Number of children: 6  . Years of education: College  . Highest education level: Not on file  Occupational History  . Occupation: Control and instrumentation engineer  . Occupation: TEACHER- [employer]    Employer: [employer]  Tobacco Use  . Smoking status: Never Smoker  . Smokeless tobacco: Never Used  Vaping Use  . Vaping Use: Never used  Substance and Sexual Activity  . Alcohol use: No  . Drug use: No  . Sexual activity:  Not on file  Other Topics Concern  . Not on file  Social History Narrative   Full Code status.   Insurance: BCBS   Social Determinants of Health   Financial Resource Strain: Not on file  Food Insecurity: Not on file  Transportation Needs: Not on file  Physical Activity: Not on file  Stress: Not on file  Social Connections: Not on file     Family History:  The patient's   family history includes Alzheimer's disease in her maternal grandmother; Angina in her father; Dementia in her mother; Diabetes in her mother; Heart attack in her father; Heart disease in her brother and mother; Hyperlipidemia in her mother; Liver cancer in her sister; Thyroid cancer in her sister.   ROS:   Please see the history of present illness.    ROS All other systems reviewed and are negative.   PHYSICAL EXAM:   VS:  BP 110/68   Pulse 70   Ht 5\' 3"  (1.6 m)   Wt 262 lb (118.8 kg)   SpO2 99%   BMI 46.41 kg/m   Physical Exam  GEN: Obese, in no acute distress  Neck: no JVD, carotid bruits, or masses Cardiac:RRR; no murmurs, rubs, or gallops  Respiratory:  clear to auscultation bilaterally, normal  work of breathing GI: soft, nontender, nondistended, + BS Ext: plus 3 edema L>R otherwise without cyanosis, clubbing, Good distal pulses bilaterally Neuro:  Alert and Oriented x 3 Psych: euthymic mood, full affect  Wt Readings from Last 3 Encounters:  08/13/20 262 lb (118.8 kg)  07/09/20 257 lb (116.6 kg)  04/21/20 242 lb (109.8 kg)      Studies/Labs Reviewed:   EKG:  EKG is not ordered today.    Recent Labs: 12/06/2019: ALT 18; Hemoglobin 12.8; Platelet Count 206 07/09/2020: BUN 12; Creatinine, Ser 0.65; NT-Pro BNP 64; Potassium 4.2; Sodium 137   Lipid Panel    Component Value Date/Time   CHOL 129 10/17/2019 0914   TRIG 50 10/17/2019 0914   HDL 47 10/17/2019 0914   CHOLHDL 2.7 10/17/2019 0914   CHOLHDL 4 03/19/2014 0745   VLDL 22.0 03/19/2014 0745   LDLCALC 71 10/17/2019 0914    Additional studies/ records that were reviewed today include:  Echo 08/07/2020: IMPRESSIONS     1. Left ventricular ejection fraction, by estimation, is 60 to 65%. The  left ventricle has normal function. The left ventricle has no regional  wall motion abnormalities. Left ventricular diastolic parameters are  consistent with Grade II diastolic  dysfunction (pseudonormalization).   2. Right ventricular systolic function is normal. The right ventricular  size is normal.   3. The mitral valve is normal in structure. No evidence of mitral valve  regurgitation. No evidence of mitral stenosis.   4. The aortic valve is normal in structure. Aortic valve regurgitation is  not visualized. No aortic stenosis is present.   5. There is mild dilatation of the ascending aorta, measuring 43 mm.   6. The inferior vena cava is normal in size with greater than 50%  respiratory variability, suggesting right atrial pressure of 3 mmHg.   Comparison(s): Prior images unable to be directly viewed, comparison made  by report only.    Echo 01/24/14:  Left ventricle: The cavity size was normal. There was mild    concentric hypertrophy. Systolic function was normal. The   estimated ejection fraction was in the range of 55% to 60%. Wall   motion was normal; there were no regional wall motion  abnormalities. - Aortic valve: There was trivial regurgitation. - Ascending aorta: The ascending aorta was mildly dilated. - Mitral valve: Calcified annulus.   Cardiac cath 01/24/14: Left main: No obstructive disease.   Left Anterior Descending Artery: Large caliber vessel that courses to the apex. Moderate caliber diagonal branch. No obstructive disease.   Circumflex Artery: Large caliber vessel with large obtuse marginal branch. No obstructive disease.   Right Coronary Artery: Moderate caliber co-dominant vessel with sub-totally occluded proximal vessel.  Left Ventricular Angiogram: LVEF=50%      Risk Assessment/Calculations:         ASSESSMENT:    1. Lower extremity edema   2. CAD in native artery   3. Primary hypertension   4. History of DVT of lower extremity   5. History of CVA (cerebrovascular accident)   6. History of GI bleed   7. Obstructive sleep apnea      PLAN:  In order of problems listed above:  LE edema-significant fluid overload which is new for patient LOV. She was Drinking hydration packs and Pedialyte daily which they have stopped. 2 gm sodium diet, I increased lasix 80 mg x 7 days then 40 mg daily, K 10 meq-2 a day for 7 days then one a day-didn't notice a difference. BNP normal.  Echo normal LVEF grade 2 DD. Weight up here 5 lbs but on patients scales down 17 lbs? Clothes fitting looser and walking better. Continue with low sodium diet and weight loss.   CAD status post inferior STEMI 01/2014 treated with BMS to the proximal RCA, no disease in the LAD or circumflex, low risk NST 09/2017-no angina   Hypertension BP controlled-on amlodpine 10 mg daily but has been on for many years. Will not reduce at this time.   Recurrent DVTs on Eliquis   Prior CVA   History of GI  bleed 10/2018-no recent problems   OSA using her CPAP     Shared Decision Making/Informed Consent        Medication Adjustments/Labs and Tests Ordered: Current medicines are reviewed at length with the patient today.  Concerns regarding medicines are outlined above.  Medication changes, Labs and Tests ordered today are listed in the Patient Instructions below. Patient Instructions   Medication Instructions:  Your physician recommends that you continue on your current medications as directed. Please refer to the Current Medication list given to you today.  *If you need a refill on your cardiac medications before your next appointment, please call your pharmacy*   Lab Work: None If you have labs (blood work) drawn today and your tests are completely normal, you will receive your results only by: [name] MyChart Message (if you have MyChart) OR . A paper copy in the mail If you have any lab test that is abnormal or we need to change your treatment, we will call you to review the results.   Follow-Up: At [facility], you and your health needs are our priority.  As part of our continuing mission to provide you with exceptional heart care, we have created designated Provider Care Teams.  These Care Teams include your primary Cardiologist (physician) and Advanced Practice Providers (APPs -  Physician Assistants and Nurse Practitioners) who all work together to provide you with the care you need, when you need it.  Your next appointment:   4-6 month(s)  The format for your next appointment:   In Person  Provider:   You may see Lauree Chandler, MD or one of the following  Advanced Practice Providers on your designated Care Team:    Melina Copa, PA-C  Ermalinda Barrios, PA-C    Other Instructions  Two Gram Sodium Diet 2000 mg  What is Sodium? Sodium is a mineral found naturally in many foods. The most significant source of sodium in the diet is table salt, which is about 40%  sodium.  Processed, convenience, and preserved foods also contain a large amount of sodium.  The body needs only 500 mg of sodium daily to function,  A normal diet provides more than enough sodium even if you do not use salt.  Why Limit Sodium? A build up of sodium in the body can cause thirst, increased blood pressure, shortness of breath, and water retention.  Decreasing sodium in the diet can reduce edema and risk of heart attack or stroke associated with high blood pressure.  Keep in mind that there are many other factors involved in these health problems.  Heredity, obesity, lack of exercise, cigarette smoking, stress and what you eat all play a role.  General Guidelines:  Do not add salt at the table or in cooking.  One teaspoon of salt contains over 2 grams of sodium.  Read food labels  Avoid processed and convenience foods  Ask your dietitian before eating any foods not dicussed in the menu planning guidelines  Consult your physician if you wish to use a salt substitute or a sodium containing medication such as antacids.  Limit milk and milk products to 16 oz (2 cups) per day.  Shopping Hints:  READ LABELS!! "Dietetic" does not necessarily mean low sodium.  Salt and other sodium ingredients are often added to foods during processing.   Menu Planning Guidelines Food Group Choose More Often Avoid  Beverages (see also the milk group All fruit juices, low-sodium, salt-free vegetables juices, low-sodium carbonated beverages Regular vegetable or tomato juices, commercially softened water used for drinking or cooking  Breads and Cereals Enriched white, wheat, rye and pumpernickel bread, hard rolls and dinner rolls; muffins, cornbread and waffles; most dry cereals, cooked cereal without added salt; unsalted crackers and breadsticks; low sodium or homemade bread crumbs Bread, rolls and crackers with salted tops; quick breads; instant hot cereals; pancakes; commercial bread stuffing;  self-rising flower and biscuit mixes; regular bread crumbs or cracker crumbs  Desserts and Sweets Desserts and sweets mad with mild should be within allowance Instant pudding mixes and cake mixes  Fats Butter or margarine; vegetable oils; unsalted salad dressings, regular salad dressings limited to 1 Tbs; light, sour and heavy cream Regular salad dressings containing bacon fat, bacon bits, and salt pork; snack dips made with instant soup mixes or processed cheese; salted nuts  Fruits Most fresh, frozen and canned fruits Fruits processed with salt or sodium-containing ingredient (some dried fruits are processed with sodium sulfites        Vegetables Fresh, frozen vegetables and low- sodium canned vegetables Regular canned vegetables, sauerkraut, pickled vegetables, and others prepared in brine; frozen vegetables in sauces; vegetables seasoned with ham, bacon or salt pork  Condiments, Sauces, Miscellaneous  Salt substitute with physician's approval; pepper, herbs, spices; vinegar, lemon or lime juice; hot pepper sauce; garlic powder, onion powder, low sodium soy sauce (1 Tbs.); low sodium condiments (ketchup, chili sauce, mustard) in limited amounts (1 tsp.) fresh ground horseradish; unsalted tortilla chips, pretzels, potato chips, popcorn, salsa (1/4 cup) Any seasoning made with salt including garlic salt, celery salt, onion salt, and seasoned salt; sea salt, rock salt, kosher salt; meat  tenderizers; monosodium glutamate; mustard, regular soy sauce, barbecue, sauce, chili sauce, teriyaki sauce, steak sauce, Worcestershire sauce, and most flavored vinegars; canned gravy and mixes; regular condiments; salted snack foods, olives, picles, relish, horseradish sauce, catsup   Food preparation: Try these seasonings Meats:    Pork Sage, onion Serve with applesauce  Chicken Poultry seasoning, thyme, parsley Serve with cranberry sauce  Lamb Curry powder, rosemary, garlic, thyme Serve with mint sauce or jelly   Veal Marjoram, basil Serve with current jelly, cranberry sauce  Beef Pepper, bay leaf Serve with dry mustard, unsalted chive butter  Fish [place] leaf, dill Serve with unsalted lemon butter, unsalted parsley butter  Vegetables:    Asparagus Lemon juice   Broccoli Lemon juice   Carrots Mustard dressing parsley, mint, nutmeg, glazed with unsalted butter and sugar   Green beans Marjoram, lemon juice, nutmeg,dill seed   Tomatoes Basil, marjoram, onion   Spice /blend for [employer]"Salt Shaker" 4 tsp ground thyme 1 tsp ground sage 3 tsp ground rosemary 4 tsp ground marjoram   Test your knowledge 1. A product that says "Salt Free" may still contain sodium. True or False 2. Garlic Powder and Hot Pepper Sauce an be used as alternative seasonings.True or False 3. Processed foods have more sodium than fresh foods.  True or False 4. Canned Vegetables have less sodium than froze True or False  WAYS TO DECREASE YOUR SODIUM INTAKE 1. Avoid the use of added salt in cooking and at the table.  Table salt (and other prepared seasonings which contain salt) is probably one of the greatest sources of sodium in the diet.  Unsalted foods can gain flavor from the sweet, sour, and butter taste sensations of herbs and spices.  Instead of using salt for seasoning, try the following seasonings with the foods listed.  Remember: how you use them to enhance natural food flavors is limited only by your creativity... Allspice-Meat, fish, eggs, fruit, peas, red and yellow vegetables Almond Extract-Fruit baked goods Anise Seed-Sweet breads, fruit, carrots, beets, cottage cheese, cookies (tastes like licorice) Basil-Meat, fish, eggs, vegetables, rice, vegetables salads, soups, sauces Bay Leaf-Meat, fish, stews, poultry Burnet-Salad, vegetables (cucumber-like flavor) Caraway Seed-Bread, cookies, cottage cheese, meat, vegetables, cheese, rice Cardamon-Baked goods, fruit, soups Celery Powder or seed-Salads, salad dressings, sauces,  meatloaf, soup, bread.Do not use  celery salt Chervil-Meats, salads, fish, eggs, vegetables, cottage cheese (parsley-like flavor) Chili Power-Meatloaf, chicken cheese, corn, eggplant, egg dishes Chives-Salads cottage cheese, egg dishes, soups, vegetables, sauces Cilantro-Salsa, casseroles Cinnamon-Baked goods, fruit, pork, lamb, chicken, carrots Cloves-Fruit, baked goods, fish, pot roast, green beans, beets, carrots Coriander-Pastry, cookies, meat, salads, cheese (lemon-orange flavor) Cumin-Meatloaf, fish,cheese, eggs, cabbage,fruit pie (caraway flavor) [employer]ersCurry Powder-Meat, fruit, eggs, fish, poultry, cottage cheese, vegetables Dill Seed-Meat, cottage cheese, poultry, vegetables, fish, salads, bread Fennel Seed-Bread, cookies, apples, pork, eggs, fish, beets, cabbage, cheese, Licorice-like flavor Garlic-(buds or powder) Salads, meat, poultry, fish, bread, butter, vegetables, potatoes.Do not  use garlic salt Ginger-Fruit, vegetables, baked goods, meat, fish, poultry Horseradish Root-Meet, vegetables, butter Lemon Juice or Extract-Vegetables, fruit, tea, baked goods, fish salads Mace-Baked goods fruit, vegetables, fish, poultry (taste like nutmeg) Maple Extract-Syrups Marjoram-Meat, chicken, fish, vegetables, breads, green salads (taste like Sage) Mint-Tea, lamb, sherbet, vegetables, desserts, carrots, cabbage Mustard, Dry or Seed-Cheese, eggs, meats, vegetables, poultry Nutmeg-Baked goods, fruit, chicken, eggs, vegetables, desserts Onion Powder-Meat, fish, poultry, vegetables, cheese, eggs, bread, rice salads (Do not use   Onion salt) Orange Extract-Desserts, baked goods Oregano-Pasta, eggs, cheese, onions, pork, lamb, fish, chicken, vegetables, green salads Paprika-Meat,  fish, poultry, eggs, cheese, vegetables Parsley Flakes-Butter, vegetables, meat fish, poultry, eggs, bread, salads (certain forms may   Contain sodium Pepper-Meat fish, poultry, vegetables, eggs Peppermint  Extract-Desserts, baked goods Poppy Seed-Eggs, bread, cheese, fruit dressings, baked goods, noodles, vegetables, cottage  [employer], poultry, meat, fish, cauliflower, turnips,eggs bread Saffron-Rice, bread, veal, chicken, fish, eggs Sage-Meat, fish, poultry, onions, eggplant, tomateos, pork, stews Savory-Eggs, salads, poultry, meat, rice, vegetables, soups, pork Tarragon-Meat, poultry, fish, eggs, butter, vegetables (licorice-like flavor)  Thyme-Meat, poultry, fish, eggs, vegetables, (clover-like flavor), sauces, soups Tumeric-Salads, butter, eggs, fish, rice, vegetables (saffron-like flavor) Vanilla Extract-Baked goods, candy Vinegar-Salads, vegetables, meat marinades Walnut Extract-baked goods, candy  2. Choose your Foods Wisely   The following is a list of foods to avoid which are high in sodium:  Meats-Avoid all smoked, canned, salt cured, dried and kosher meat and fish as well as Anchovies   [employer] meats:Bologna, Liverwurst, Pastrami Canned meat or fish  Marinated herring Caviar    Pepperoni Corned Beef   Pizza Dried chipped beef  Salami Frozen breaded fish or meat Salt pork Frankfurters or hot dogs  Sardines Gefilte fish   Sausage Ham (boiled ham, Proscuitto Smoked butt    spiced ham)   Spam      TV Dinners Vegetables Canned vegetables (Regular) Relish Canned mushrooms  Sauerkraut Olives    Tomato juice Pickles  Bakery and Dessert Products Canned puddings  Cream pies Cheesecake   Decorated cakes Cookies  Beverages/Juices Tomato juice, regular  Gatorade   V-8 vegetable juice, regular  Breads and Cereals Biscuit mixes   Salted potato chips, corn chips, pretzels Bread stuffing mixes  Salted crackers and rolls Pancake and waffle mixes Self-rising flour  Seasonings Accent    Meat sauces Barbecue sauce  Meat tenderizer Catsup    Monosodium glutamate (MSG) Celery salt   Onion salt Chili sauce   Prepared  mustard Garlic salt   Salt, seasoned salt, sea salt Gravy mixes   Soy sauce Horseradish   Steak sauce Ketchup   Tartar sauce Lite salt    Teriyaki sauce Marinade mixes   Worcestershire sauce  Others Baking powder   Cocoa and cocoa mixes Baking soda   Commercial casserole mixes Candy-caramels, chocolate  Dehydrated soups    Bars, fudge,nougats  Instant rice and pasta mixes Canned broth or soup  Maraschino cherries Cheese, aged and processed cheese and cheese spreads  Learning Assessment Quiz  Indicated T (for True) or F (for False) for each of the following statements:  1. _____ Fresh fruits and vegetables and unprocessed grains are generally low in sodium 2. _____ Water may contain a considerable amount of sodium, depending on the source 3. _____ You can always tell if a food is high in sodium by tasting it 4. _____ Certain laxatives my be high in sodium and should be avoided unless prescribed   by a physician or pharmacist 5. _____ Salt substitutes may be used freely by anyone on a sodium restricted diet 6. _____ Sodium is present in table salt, food additives and as a natural component of   most foods 7. _____ Table salt is approximately 90% sodium 8. _____ Limiting sodium intake may help prevent excess fluid accumulation in the body 9. _____ On a sodium-restricted diet, seasonings such as bouillon soy sauce, and    cooking wine should be used in place of table salt 10. _____ On an ingredient list, a product which lists monosodium glutamate as the first  ingredient is an appropriate food to include on a low sodium diet  Circle the best answer(s) to the following statements (Hint: there may be more than one correct answer)  11. On a low-sodium diet, some acceptable snack items are:    A. Olives  F. Bean dip   K. Grapefruit juice    B. Salted Pretzels G. Commercial Popcorn   L. Canned peaches    C. Carrot Sticks  H. Bouillon   M. Unsalted nuts   D. [place] fries  I. Peanut  butter crackers N. Salami   E. Sweet pickles J. Tomato Juice   O. Pizza  12.  Seasonings that may be used freely on a reduced - sodium diet include   A. Lemon wedges F.Monosodium glutamate K. Celery seed    B.Soysauce   G. Pepper   L. Mustard powder   C. Sea salt  H. Cooking wine  M. Onion flakes   D. Vinegar  E. Prepared horseradish N. Salsa   E. Sage   J. Worcestershire sauce  O. Chutney      Signed, Ermalinda Barrios, PA-C  08/13/2020 2:22 PM    [facility] [place] [place], [place], [place]  [address] Phone: [phone]; Fax: [phone]

## 2020-08-07 ENCOUNTER — Other Ambulatory Visit: Payer: Self-pay

## 2020-08-07 ENCOUNTER — Ambulatory Visit (HOSPITAL_COMMUNITY): Payer: Medicare Other | Attending: Cardiovascular Disease

## 2020-08-07 DIAGNOSIS — I5031 Acute diastolic (congestive) heart failure: Secondary | ICD-10-CM

## 2020-08-07 LAB — ECHOCARDIOGRAM COMPLETE
Area-P 1/2: 2.66 cm2
S' Lateral: 3.1 cm

## 2020-08-07 MED ORDER — PERFLUTREN LIPID MICROSPHERE
1.0000 mL | INTRAVENOUS | Status: AC | PRN
  Administered 2020-08-07: 2 mL via INTRAVENOUS

## 2020-08-08 ENCOUNTER — Telehealth: Payer: Self-pay | Admitting: Cardiovascular Disease

## 2020-08-08 NOTE — Telephone Encounter (Signed)
Patient's daughter is returning call to discuss echo results. 

## 2020-08-08 NOTE — Telephone Encounter (Signed)
Shirley Burn, PA-C  08/07/2020 2:27 PM EDT      Echo shows normal heart function, heart has trouble relaxing as expected. Limit salt in diet. thanks    The patients daughter Shirley [name] (on [place] in [place]),  has been notified of the pts result and verbalized understanding.  All questions (if any) were answered. Nuala Alpha, LPN 7/0/1410 3:01 PM

## 2020-08-13 ENCOUNTER — Encounter: Payer: Self-pay | Admitting: Physician Assistant

## 2020-08-13 ENCOUNTER — Other Ambulatory Visit: Payer: Self-pay

## 2020-08-13 ENCOUNTER — Ambulatory Visit (INDEPENDENT_AMBULATORY_CARE_PROVIDER_SITE_OTHER): Payer: Medicare Other | Admitting: Physician Assistant

## 2020-08-13 VITALS — BP 110/68 | HR 70 | Ht 63.0 in | Wt 262.0 lb

## 2020-08-13 DIAGNOSIS — I2510 Atherosclerotic heart disease of native coronary artery without angina pectoris: Secondary | ICD-10-CM

## 2020-08-13 DIAGNOSIS — Z86718 Personal history of other venous thrombosis and embolism: Secondary | ICD-10-CM

## 2020-08-13 DIAGNOSIS — R600 Localized edema: Secondary | ICD-10-CM

## 2020-08-13 DIAGNOSIS — G4733 Obstructive sleep apnea (adult) (pediatric): Secondary | ICD-10-CM

## 2020-08-13 DIAGNOSIS — I10 Essential (primary) hypertension: Secondary | ICD-10-CM | POA: Diagnosis not present

## 2020-08-13 DIAGNOSIS — Z8673 Personal history of transient ischemic attack (TIA), and cerebral infarction without residual deficits: Secondary | ICD-10-CM | POA: Diagnosis not present

## 2020-08-13 DIAGNOSIS — Z8719 Personal history of other diseases of the digestive system: Secondary | ICD-10-CM

## 2020-08-13 NOTE — Patient Instructions (Signed)
Medication Instructions:  Your physician recommends that you continue on your current medications as directed. Please refer to the Current Medication list given to you today.  *If you need a refill on your cardiac medications before your next appointment, please call your pharmacy*   Lab Work: None If you have labs (blood work) drawn today and your tests are completely normal, you will receive your results only by: [name] MyChart Message (if you have MyChart) OR . A paper copy in the mail If you have any lab test that is abnormal or we need to change your treatment, we will call you to review the results.   Follow-Up: At [facility], you and your health needs are our priority.  As part of our continuing mission to provide you with exceptional heart care, we have created designated Provider Care Teams.  These Care Teams include your primary Cardiologist (physician) and Advanced Practice Providers (APPs -  Physician Assistants and Nurse Practitioners) who all work together to provide you with the care you need, when you need it.  Your next appointment:   4-6 month(s)  The format for your next appointment:   In Person  Provider:   You may see Lauree Chandler, MD or one of the following Advanced Practice Providers on your designated Care Team:    Melina Copa, PA-C  Ermalinda Barrios, PA-C    Other Instructions  Two Gram Sodium Diet 2000 mg  What is Sodium? Sodium is a mineral found naturally in many foods. The most significant source of sodium in the diet is table salt, which is about 40% sodium.  Processed, convenience, and preserved foods also contain a large amount of sodium.  The body needs only 500 mg of sodium daily to function,  A normal diet provides more than enough sodium even if you do not use salt.  Why Limit Sodium? A build up of sodium in the body can cause thirst, increased blood pressure, shortness of breath, and water retention.  Decreasing sodium in the diet can  reduce edema and risk of heart attack or stroke associated with high blood pressure.  Keep in mind that there are many other factors involved in these health problems.  Heredity, obesity, lack of exercise, cigarette smoking, stress and what you eat all play a role.  General Guidelines:  Do not add salt at the table or in cooking.  One teaspoon of salt contains over 2 grams of sodium.  Read food labels  Avoid processed and convenience foods  Ask your dietitian before eating any foods not dicussed in the menu planning guidelines  Consult your physician if you wish to use a salt substitute or a sodium containing medication such as antacids.  Limit milk and milk products to 16 oz (2 cups) per day.  Shopping Hints:  READ LABELS!! "Dietetic" does not necessarily mean low sodium.  Salt and other sodium ingredients are often added to foods during processing.   Menu Planning Guidelines Food Group Choose More Often Avoid  Beverages (see also the milk group All fruit juices, low-sodium, salt-free vegetables juices, low-sodium carbonated beverages Regular vegetable or tomato juices, commercially softened water used for drinking or cooking  Breads and Cereals Enriched white, wheat, rye and pumpernickel bread, hard rolls and dinner rolls; muffins, cornbread and waffles; most dry cereals, cooked cereal without added salt; unsalted crackers and breadsticks; low sodium or homemade bread crumbs Bread, rolls and crackers with salted tops; quick breads; instant hot cereals; pancakes; commercial bread stuffing; self-rising flower  and biscuit mixes; regular bread crumbs or cracker crumbs  Desserts and Sweets Desserts and sweets mad with mild should be within allowance Instant pudding mixes and cake mixes  Fats Butter or margarine; vegetable oils; unsalted salad dressings, regular salad dressings limited to 1 Tbs; light, sour and heavy cream Regular salad dressings containing bacon fat, bacon bits, and salt pork;  snack dips made with instant soup mixes or processed cheese; salted nuts  Fruits Most fresh, frozen and canned fruits Fruits processed with salt or sodium-containing ingredient (some dried fruits are processed with sodium sulfites        Vegetables Fresh, frozen vegetables and low- sodium canned vegetables Regular canned vegetables, sauerkraut, pickled vegetables, and others prepared in brine; frozen vegetables in sauces; vegetables seasoned with ham, bacon or salt pork  Condiments, Sauces, Miscellaneous  Salt substitute with physician's approval; pepper, herbs, spices; vinegar, lemon or lime juice; hot pepper sauce; garlic powder, onion powder, low sodium soy sauce (1 Tbs.); low sodium condiments (ketchup, chili sauce, mustard) in limited amounts (1 tsp.) fresh ground horseradish; unsalted tortilla chips, pretzels, potato chips, popcorn, salsa (1/4 cup) Any seasoning made with salt including garlic salt, celery salt, onion salt, and seasoned salt; sea salt, rock salt, kosher salt; meat tenderizers; monosodium glutamate; mustard, regular soy sauce, barbecue, sauce, chili sauce, teriyaki sauce, steak sauce, Worcestershire sauce, and most flavored vinegars; canned gravy and mixes; regular condiments; salted snack foods, olives, picles, relish, horseradish sauce, catsup   Food preparation: Try these seasonings Meats:    Pork Sage, onion Serve with applesauce  Chicken Poultry seasoning, thyme, parsley Serve with cranberry sauce  Lamb Curry powder, rosemary, garlic, thyme Serve with mint sauce or jelly  Veal Marjoram, basil Serve with current jelly, cranberry sauce  Beef Pepper, bay leaf Serve with dry mustard, unsalted chive butter  Fish Bay leaf, dill Serve with unsalted lemon butter, unsalted parsley butter  Vegetables:    Asparagus Lemon juice   Broccoli Lemon juice   Carrots Mustard dressing parsley, mint, nutmeg, glazed with unsalted butter and sugar   Green beans Marjoram, lemon juice,  nutmeg,dill seed   Tomatoes Basil, marjoram, onion   Spice /blend for [employer]" 4 tsp ground thyme 1 tsp ground sage 3 tsp ground rosemary 4 tsp ground marjoram   Test your knowledge 1. A product that says "Salt Free" may still contain sodium. True or False 2. Garlic Powder and Hot Pepper Sauce an be used as alternative seasonings.True or False 3. Processed foods have more sodium than fresh foods.  True or False 4. Canned Vegetables have less sodium than froze True or False  WAYS TO DECREASE YOUR SODIUM INTAKE 1. Avoid the use of added salt in cooking and at the table.  Table salt (and other prepared seasonings which contain salt) is probably one of the greatest sources of sodium in the diet.  Unsalted foods can gain flavor from the sweet, sour, and butter taste sensations of herbs and spices.  Instead of using salt for seasoning, try the following seasonings with the foods listed.  Remember: how you use them to enhance natural food flavors is limited only by your creativity... Allspice-Meat, fish, eggs, fruit, peas, red and yellow vegetables Almond Extract-Fruit baked goods Anise Seed-Sweet breads, fruit, carrots, beets, cottage cheese, cookies (tastes like licorice) Basil-Meat, fish, eggs, vegetables, rice, vegetables salads, soups, sauces Bay Leaf-Meat, fish, stews, poultry Burnet-Salad, vegetables (cucumber-like flavor) Caraway Seed-Bread, cookies, cottage cheese, meat, vegetables, cheese, rice Cardamon-Baked goods, fruit, soups Celery Powder  or seed-Salads, salad dressings, sauces, meatloaf, soup, bread.Do not use  celery salt Chervil-Meats, salads, fish, eggs, vegetables, cottage cheese (parsley-like flavor) Chili Power-Meatloaf, chicken cheese, corn, eggplant, egg dishes Chives-Salads cottage cheese, egg dishes, soups, vegetables, sauces Cilantro-Salsa, casseroles Cinnamon-Baked goods, fruit, pork, lamb, chicken, carrots Cloves-Fruit, baked goods, fish, pot roast, green beans,  beets, carrots Coriander-Pastry, cookies, meat, salads, cheese (lemon-orange flavor) Cumin-Meatloaf, fish,cheese, eggs, cabbage,fruit pie (caraway flavor) [employer], fruit, eggs, fish, poultry, cottage cheese, vegetables Dill Seed-Meat, cottage cheese, poultry, vegetables, fish, salads, bread Fennel Seed-Bread, cookies, apples, pork, eggs, fish, beets, cabbage, cheese, Licorice-like flavor Garlic-(buds or powder) Salads, meat, poultry, fish, bread, butter, vegetables, potatoes.Do not  use garlic salt Ginger-Fruit, vegetables, baked goods, meat, fish, poultry Horseradish Root-Meet, vegetables, butter Lemon Juice or Extract-Vegetables, fruit, tea, baked goods, fish salads Mace-Baked goods fruit, vegetables, fish, poultry (taste like nutmeg) Maple Extract-Syrups Marjoram-Meat, chicken, fish, vegetables, breads, green salads (taste like Sage) Mint-Tea, lamb, sherbet, vegetables, desserts, carrots, cabbage Mustard, Dry or Seed-Cheese, eggs, meats, vegetables, poultry Nutmeg-Baked goods, fruit, chicken, eggs, vegetables, desserts Onion Powder-Meat, fish, poultry, vegetables, cheese, eggs, bread, rice salads (Do not use   Onion salt) Orange Extract-Desserts, baked goods Oregano-Pasta, eggs, cheese, onions, pork, lamb, fish, chicken, vegetables, green salads Paprika-Meat, fish, poultry, eggs, cheese, vegetables Parsley Flakes-Butter, vegetables, meat fish, poultry, eggs, bread, salads (certain forms may   Contain sodium Pepper-Meat fish, poultry, vegetables, eggs Peppermint Extract-Desserts, baked goods Poppy Seed-Eggs, bread, cheese, fruit dressings, baked goods, noodles, vegetables, cottage  [employer], poultry, meat, fish, cauliflower, turnips,eggs bread Saffron-Rice, bread, veal, chicken, fish, eggs Sage-Meat, fish, poultry, onions, eggplant, tomateos, pork, stews Savory-Eggs, salads, poultry, meat, rice, vegetables, soups,  pork Tarragon-Meat, poultry, fish, eggs, butter, vegetables (licorice-like flavor)  Thyme-Meat, poultry, fish, eggs, vegetables, (clover-like flavor), sauces, soups Tumeric-Salads, butter, eggs, fish, rice, vegetables (saffron-like flavor) Vanilla Extract-Baked goods, candy Vinegar-Salads, vegetables, meat marinades Walnut Extract-baked goods, candy  2. Choose your Foods Wisely   The following is a list of foods to avoid which are high in sodium:  Meats-Avoid all smoked, canned, salt cured, dried and kosher meat and fish as well as Anchovies   [employer] meats:Bologna, Liverwurst, Pastrami Canned meat or fish  Marinated herring Caviar    Pepperoni Corned Beef   Pizza Dried chipped beef  Salami Frozen breaded fish or meat Salt pork Frankfurters or hot dogs  Sardines Gefilte fish   Sausage Ham (boiled ham, Proscuitto Smoked butt    spiced ham)   Spam      TV Dinners Vegetables Canned vegetables (Regular) Relish Canned mushrooms  Sauerkraut Olives    Tomato juice Pickles  Bakery and Dessert Products Canned puddings  Cream pies Cheesecake   Decorated cakes Cookies  Beverages/Juices Tomato juice, regular  Gatorade   V-8 vegetable juice, regular  Breads and Cereals Biscuit mixes   Salted potato chips, corn chips, pretzels Bread stuffing mixes  Salted crackers and rolls Pancake and waffle mixes Self-rising flour  Seasonings Accent    Meat sauces Barbecue sauce  Meat tenderizer Catsup    Monosodium glutamate (MSG) Celery salt   Onion salt Chili sauce   Prepared mustard Garlic salt   Salt, seasoned salt, sea salt Gravy mixes   Soy sauce Horseradish   Steak sauce Ketchup   Tartar sauce Lite salt    Teriyaki sauce Marinade mixes   Worcestershire sauce  Others Baking powder   Cocoa and cocoa mixes Baking soda   Commercial casserole mixes Candy-caramels,  chocolate  Dehydrated soups    Bars, fudge,nougats  Instant rice and pasta mixes Canned broth or  soup  Maraschino cherries Cheese, aged and processed cheese and cheese spreads  Learning Assessment Quiz  Indicated T (for True) or F (for False) for each of the following statements:  1. _____ Fresh fruits and vegetables and unprocessed grains are generally low in sodium 2. _____ Water may contain a considerable amount of sodium, depending on the source 3. _____ You can always tell if a food is high in sodium by tasting it 4. _____ Certain laxatives my be high in sodium and should be avoided unless prescribed   by a physician or pharmacist 5. _____ Salt substitutes may be used freely by anyone on a sodium restricted diet 6. _____ Sodium is present in table salt, food additives and as a natural component of   most foods 7. _____ Table salt is approximately 90% sodium 8. _____ Limiting sodium intake may help prevent excess fluid accumulation in the body 9. _____ On a sodium-restricted diet, seasonings such as bouillon soy sauce, and    cooking wine should be used in place of table salt 10. _____ On an ingredient list, a product which lists monosodium glutamate as the first   ingredient is an appropriate food to include on a low sodium diet  Circle the best answer(s) to the following statements (Hint: there may be more than one correct answer)  11. On a low-sodium diet, some acceptable snack items are:    A. Olives  F. Bean dip   K. Grapefruit juice    B. Salted Pretzels G. Commercial Popcorn   L. Canned peaches    C. Carrot Sticks  H. Bouillon   M. Unsalted nuts   D. [place] fries  I. Peanut butter crackers N. Salami   E. Sweet pickles J. Tomato Juice   O. Pizza  12.  Seasonings that may be used freely on a reduced - sodium diet include   A. Lemon wedges F.Monosodium glutamate K. Celery seed    B.Soysauce   G. Pepper   L. Mustard powder   C. Sea salt  H. Cooking wine  M. Onion flakes   D. Vinegar  E. Prepared horseradish N. Salsa   E. Sage   J. Worcestershire sauce  O.  Chutney

## 2020-08-15 ENCOUNTER — Other Ambulatory Visit: Payer: Self-pay

## 2020-08-15 ENCOUNTER — Other Ambulatory Visit (HOSPITAL_BASED_OUTPATIENT_CLINIC_OR_DEPARTMENT_OTHER): Payer: Self-pay

## 2020-08-15 ENCOUNTER — Ambulatory Visit: Payer: Medicare Other | Attending: Internal Medicine

## 2020-08-15 DIAGNOSIS — Z23 Encounter for immunization: Secondary | ICD-10-CM

## 2020-08-15 MED ORDER — PFIZER-BIONT COVID-19 VAC-TRIS 30 MCG/0.3ML IM SUSP
INTRAMUSCULAR | 0 refills | Status: DC
  Filled 2020-08-15: qty 0.3, 1d supply, fill #0

## 2020-08-15 NOTE — Progress Notes (Signed)
   Covid-19 Vaccination Clinic  Name:  Shirley [name]    MRN: [id number] DOB: Dec 17, 1940  08/15/2020  Ms. [name] was observed post Covid-19 immunization for 15 minutes without incident. She was provided with Vaccine Information Sheet and instruction to access the V-Safe system.   Ms. [name] was instructed to call 911 with any severe reactions post vaccine: [name] Difficulty breathing  . Swelling of face and throat  . A fast heartbeat  . A bad rash all over body  . Dizziness and weakness   Immunizations Administered    Name Date Dose VIS Date Route   PFIZER Comrnaty(Gray TOP) Covid-19 Vaccine 08/15/2020 10:46 AM 0.3 mL 03/13/2020 Intramuscular   Manufacturer: [employer], [employer]   Lot: [id number]   NDC: [id number]

## 2020-08-19 ENCOUNTER — Telehealth: Payer: Self-pay | Admitting: *Deleted

## 2020-08-19 ENCOUNTER — Encounter: Payer: Self-pay | Admitting: Gastroenterology

## 2020-08-19 ENCOUNTER — Ambulatory Visit (INDEPENDENT_AMBULATORY_CARE_PROVIDER_SITE_OTHER): Payer: Medicare Other | Admitting: Gastroenterology

## 2020-08-19 VITALS — BP 110/80 | HR 64 | Ht 63.0 in | Wt 262.0 lb

## 2020-08-19 DIAGNOSIS — Z8711 Personal history of peptic ulcer disease: Secondary | ICD-10-CM | POA: Diagnosis not present

## 2020-08-19 DIAGNOSIS — M170 Bilateral primary osteoarthritis of knee: Secondary | ICD-10-CM | POA: Diagnosis not present

## 2020-08-19 DIAGNOSIS — Z8619 Personal history of other infectious and parasitic diseases: Secondary | ICD-10-CM

## 2020-08-19 DIAGNOSIS — M1612 Unilateral primary osteoarthritis, left hip: Secondary | ICD-10-CM | POA: Diagnosis not present

## 2020-08-19 NOTE — Patient Instructions (Signed)
If you are age 80 or older, your body mass index should be between 23-30. Your Body mass index is 46.41 kg/m. If this is out of the aforementioned range listed, please consider follow up with your Primary Care Provider.  If you are age 78 or younger, your body mass index should be between 19-25. Your Body mass index is 46.41 kg/m. If this is out of the aformentioned range listed, please consider follow up with your Primary Care Provider.   It was a pleasure to see you today!  Thank you for trusting me with your gastrointestinal care!

## 2020-08-19 NOTE — Progress Notes (Signed)
[facility] Progress Note  Chief Complaint: History of gastric ulcer  Subjective  History: Shirley [name] was seen for the first time since her upper endoscopy in August 2020.  She had been hospitalized about a month prior to that with a bleeding gastric ulcer, was H. pylori positive and on aspirin for history of coronary artery disease.  H. pylori was treated with amoxicillin and clarithromycin, repeat upper endoscopy on 11/06/2018 showed healed ulcer and biopsies confirmed eradication of H. pylori.  Because she needed to stay on aspirin for CAD, I recommended indefinite use of once daily PPI. She also has a history of DVT and is maintained on chronic [place].  Shirley [name] was here with her daughter today.  She denies heartburn as before, denies dysphagia or odynophagia.  Sometimes she feels a "bubble of gas" in the lower chest and has to belch.  She has no black or bloody stool.  Was taking omeprazole daily until she ran out a few days ago.  She also stopped aspirin, now only taking it maybe once a month "when my blood pressure is up".  This seems to be news to her daughter.  ROS: Cardiovascular:  no chest pain Respiratory: no dyspnea Lately had increasing arthritic knee pain, and had a left knee steroid injection this morning.  She came in a wheelchair today because her knee is hurting her. The patient's Past Medical, Family and Social History were reviewed and are on file in the EMR.  Objective:  Med list reviewed  Current Outpatient Medications:  .  acetaminophen (TYLENOL) 500 MG tablet, Take 500 mg by mouth 2 (two) times daily as needed (for pain)., Disp: , Rfl:  .  amLODipine (NORVASC) 10 MG tablet, Take 1 tablet (10 mg total) by mouth daily., Disp: 90 tablet, Rfl: 3 .  apixaban (ELIQUIS) 2.5 MG TABS tablet, Take 1 tablet (2.5 mg total) by mouth 2 (two) times daily., Disp: 60 tablet, Rfl: 5 .  aspirin EC 81 MG tablet, Swallow whole. Take one tablet 3 times week., Disp: 90 tablet, Rfl: 3 .   atorvastatin (LIPITOR) 80 MG tablet, TAKE 1 TABLET BY MOUTH EVERY DAY, Disp: 90 tablet, Rfl: 2 .  cholecalciferol (VITAMIN D3) 25 MCG (1000 UNIT) tablet, Take 1,000 Units by mouth daily., Disp: , Rfl:  .  COVID-19 mRNA Vac-TriS, Pfizer, (PFIZER-BIONT COVID-19 VAC-TRIS) SUSP injection, Inject into the muscle., Disp: 0.3 mL, Rfl: 0 .  Ferrous Gluconate 239 (27 Fe) MG TABS, Take by mouth., Disp: , Rfl:  .  furosemide (LASIX) 40 MG tablet, Take 40 mg by mouth., Disp: , Rfl:  .  ibuprofen (ADVIL) 200 MG tablet, Take 200 mg by mouth every 6 (six) hours as needed for headache or moderate pain. , Disp: , Rfl:  .  lisinopril (ZESTRIL) 40 MG tablet, Take 40 mg by mouth daily., Disp: , Rfl:  .  metoprolol succinate (TOPROL-XL) 25 MG 24 hr tablet, TAKE 1 TABLET BY MOUTH EVERY DAY, Disp: 90 tablet, Rfl: 3 .  Multiple Vitamin (MULTIVITAMIN WITH MINERALS) TABS, Take 1 tablet by mouth every morning., Disp: , Rfl:  .  Naphazoline HCl (CLEAR EYES OP), Place 1 drop into both eyes as needed (for irritation or dryness). , Disp: , Rfl:  .  nitroGLYCERIN (NITROSTAT) 0.4 MG SL tablet, Place 1 tablet (0.4 mg total) under the tongue every 5 (five) minutes x 3 doses as needed for chest pain., Disp: 25 tablet, Rfl: 3 .  pantoprazole (PROTONIX) 40 MG tablet, TAKE 1  TABLET DAILY **SCHEDULE FOLLOW UP FOR FURTHER REFILLS**, Disp: 30 tablet, Rfl: 0 .  potassium chloride (KLOR-CON) 10 MEQ tablet, Take 10 mEq by mouth daily., Disp: , Rfl:  .  sodium chloride (OCEAN) 0.65 % SOLN nasal spray, Place 1 spray into both nostrils as needed for congestion., Disp: , Rfl:    Vital signs in last 24 hrs: Vitals:   08/19/20 1427  BP: 110/80  Pulse: 64  SpO2: 98%   Wt Readings from Last 3 Encounters:  08/19/20 262 lb (118.8 kg)  08/13/20 262 lb (118.8 kg)  07/09/20 257 lb (116.6 kg)    Physical Exam   HEENT: sclera anicteric, oral mucosa moist without lesions  Neck: supple, no thyromegaly, JVD or lymphadenopathy  Cardiac: RRR  without murmurs, S1S2 heard, no peripheral edema  Pulm: clear to auscultation bilaterally, normal RR and effort noted  Abdomen: soft, obese, no tenderness, with active bowel sounds.  Difficult to assess mass or organomegaly due to body habitus and sitting in wheelchair  Labs:   ___________________________________________ Radiologic studies:   ____________________________________________ Other:   _____________________________________________ Assessment & Plan  Assessment: Encounter Diagnoses  Name Primary?  . History of gastric ulcer Yes  . History of Helicobacter pylori infection    Benign upper digestive symptoms, she does not have chronic reflux and no history of esophagitis.  Ulcer was combination of aspirin and H. pylori.  I told her daughter I would copy this to her cardiologist and asked them to comment on whether or not she should still be taking aspirin regularly (and replied to both me and the patient's daughter). If not, then I do not feel she needs long-term PPI.  If so, then I will continue it every other day.   20 minutes were spent on this encounter (including chart review, history/exam, counseling/coordination of care, and documentation) > 50% of that time was spent on counseling and coordination of care.  Topics discussed included: see above.  Nelida Meuse III

## 2020-08-19 NOTE — Telephone Encounter (Signed)
-----   Message from Burnell Blanks, MD sent at 08/19/2020  5:04 PM EDT ----- It would be optimal if we could keep her on ASA 81 mg daily given the bare metal stent in her RCA. If that's ok from your standpoint, would continue the ASA and PPI.   Ritvik Mczeal, Can we let the patient know to continue ASA?  Thanks,  Gerald Stabs ----- Message ----- From: Doran Stabler, MD Sent: 08/19/2020   3:04 PM EDT To: Burnell Blanks, MD  Dr. [name],  Please review my note on this mutual patient when you have a chance. In particular, the patient seemed uncertain about whether she should still be taking aspirin regularly.  She has a history of coronary disease and had been on daily aspirin the last I saw her in August 2020. She had a gastric ulcer in July 2020 from a combination of aspirin and H. pylori which was treated.  I would greatly appreciate a reply, and the patient's daughter would also like a call from your clinical staff with direction.  If this patient is to remain on aspirin, then I will keep her on regular dosing of PPI to decrease the chance of future ulcer.  Thanks very much.  - H. Danis Vel[facility]

## 2020-08-19 NOTE — Telephone Encounter (Signed)
Called to speak w patient's daughter and adv that pt should continue aspirin.  No answer, VM full.  Unable to leave a message.   Will try back.

## 2020-08-20 ENCOUNTER — Other Ambulatory Visit: Payer: Self-pay | Admitting: Gastroenterology

## 2020-08-20 DIAGNOSIS — Z8711 Personal history of peptic ulcer disease: Secondary | ICD-10-CM

## 2020-08-20 MED ORDER — OMEPRAZOLE 20 MG PO CPDR: 20.0000 mg | DELAYED_RELEASE_CAPSULE | ORAL | 3 refills | Status: AC

## 2020-09-10 NOTE — Telephone Encounter (Addendum)
Per DPR - Left detailed message of the information below, including that per Dr. [name] the patient should remain on aspirin 81 mg daily.  Med list shows pt takes aspirin 3 x per week.  Sent MyChart message to patient/daughter to make sure they get message and to confirm how often she is taking aspirin.

## 2020-09-24 ENCOUNTER — Encounter: Payer: Self-pay | Admitting: Gynecologic Oncology

## 2020-09-25 NOTE — Progress Notes (Unsigned)
Gynecologic Oncology Return Clinic Visit  09/25/20  Reason for Visit: surveillance visit in the setting of early-stage uterine cancer  Treatment History: Oncology History Overview Note  IHC MMR intact MSI stable    Endometrial cancer ([place])  02/26/2019 Initial Biopsy   Endometrial biopsy revealed grade 1 endometrioid adenocarcinoma   02/26/2019 Initial Diagnosis   Endometrial cancer ([place])   03/27/2019 Imaging   CT abdomen and pelvis: Diffuse endometrial thickening, consistent with known endometrial carcinoma.  No evidence of metastatic disease within the abdomen or pelvis.  Several small uterine fibroids largest measuring 4 cm.   03/28/2019 Surgery   D&C: Pathology shows endometrioid carcinoma, FIGO grade 1-2   03/28/2019 Treatment Plan Change   Mirena IUD inserted   07/02/2019 Pathology Results   EMB: focal Gr1 endometrioid adenocarcinoma Stromal changes c/w hormone effect   09/11/2019 Surgery   TRH/BSO, SLN  On EUA, 8-10cm uterus, mobile. On intra-abdominal entry, normal upper abdominal survey. Normal appearing small and large bowel, omentum. Normal appearing bilateral adnexa. Uterus and bulbous, especially at the fundus, with several 1-2cm intra-mural fibroids. Mapping successful to bilateral obturator nodes. IUD noted within the uterus after specimen removal. NO intra-abdominal or pelvic evidence of disease.   09/11/2019 Pathology Results   IA, grade 1 endometrioid, no MI, negative SLNs, no LVSI   09/11/2019 Cancer Staging   Staging form: Corpus Uteri - Carcinoma and Carcinosarcoma, AJCC 8th Edition - Clinical stage from 09/11/2019: FIGO Stage IA (cT1a, cN0, cM0) - Signed by Lafonda Mosses, MD on 09/17/2019      Interval History:   Saw cardiologist on 4/6, had significant LE edema and was increased on her lasix for a brief period. ECHO showed EF of 60-65%. Seen again on 5/11.  **  Reports she is doing well.  Denies any vaginal bleeding or discharge with the  exception of yesterday.  Notes having 2 small drops of blood on her panty liner.  She notes normal bowel and bladder function.  She endorses a good appetite without nausea or emesis.  She feels that her clothes are feeling looser although has gained weight since her last visit.   Saw her medical oncologist recently.  Plan is for her to be on Eliquis indefinitively.  Past Medical/Surgical History: Past Medical History:  Diagnosis Date   BMI 40.0-44.9, adult ([place])    CAD in native artery, stent to RCA BMS with MI 2015 11/04/2003   a. inferior STEMI 01/2014 s/p BMS to prox RCA.   DVT (deep venous thrombosis) ([place]) 2003   lower extremity DVT with questionable recurrence in 2005. Another admission 2015.   Endometrial cancer ([place])    Endometrial thickening on ultra sound 07/2009   path showing Fragments of benign endocervical and squamous mucosa. No dysplasia or malignancy // Followed by Dr. [name]   First degree AV block    Gastric ulcer    GERD (gastroesophageal reflux disease)    GI bleed    Hemorrhoid    Hyperlipidemia LDL goal <70    Hypertension    Insomnia 11/02/2010   Memory difficulties 11/02/2010   Mild dilation of ascending aorta ([place])    Morbid obesity ([place]) 09/16/2012   Obstructive sleep apnea 09/23/2010   Pre-diabetes    Pulmonary nodule    ST elevation myocardial infarction (STEMI) of inferior wall ([place]) 01/24/2014   Stroke ([place]) 2015   Syncope 12/18/2013   Syncope, near 12/14/2018   TIA (transient ischemic attack) 09/23/2010   09/13/2010: CT, MRI/MRA no acute process.  Carotid doppler: no stenosis. 2D-Echo Normal LV function with an EF 55-60%.    Trifascicular block    UTI (urinary tract infection) 01/27/2014    Past Surgical History:  Procedure Laterality Date   COLONOSCOPY W/ POLYPECTOMY  11/01/2007   8 mm rectal adenoma, hemorrhoids   CORONARY ANGIOPLASTY WITH STENT PLACEMENT  01/24/14   Inf. STEMI, BMS to RCA   [place] N/A 03/28/2019    Procedure: DILATATION AND CURETTAGE OF UTERUS;  Surgeon: Lafonda Mosses, MD;  Location: [facility];  Service: Gynecology;  Laterality: N/A;   ESOPHAGOGASTRODUODENOSCOPY N/A 10/06/2018   Procedure: ESOPHAGOGASTRODUODENOSCOPY (EGD);  Surgeon: Doran Stabler, MD;  Location: [facility];  Service: Gastroenterology;  Laterality: N/A;   HOT HEMOSTASIS N/A 10/06/2018   Procedure: HOT HEMOSTASIS (ARGON PLASMA COAGULATION/BICAP);  Surgeon: Doran Stabler, MD;  Location: [facility];  Service: Gastroenterology;  Laterality: N/A;   INTRAUTERINE DEVICE (IUD) INSERTION N/A 03/28/2019   Procedure: INTRAUTERINE DEVICE (IUD) INSERTION;  Surgeon: Lafonda Mosses, MD;  Location: [facility];  Service: Gynecology;  Laterality: N/A;   KNEE SURGERY  1996   Left knee arthroscopy.   LEFT HEART CATHETERIZATION WITH CORONARY ANGIOGRAM N/A 01/24/2014   Procedure: LEFT HEART CATHETERIZATION WITH CORONARY ANGIOGRAM;  Surgeon: Burnell Blanks, MD;  Location: [facility];  Service: Cardiovascular;  Laterality: N/A;   ROBOTIC ASSISTED TOTAL HYSTERECTOMY WITH BILATERAL SALPINGO OOPHERECTOMY N/A 09/11/2019   Procedure: XI ROBOTIC ASSISTED TOTAL HYSTERECTOMY WITH BILATERAL SALPINGO OOPHORECTOMY, LYMPH NODE DISSECTION;  Surgeon: Lafonda Mosses, MD;  Location: [facility];  Service: Gynecology;  Laterality: N/A;   ROTATOR CUFF REPAIR     right.   SCLEROTHERAPY  10/06/2018   Procedure: SCLEROTHERAPY;  Surgeon: Doran Stabler, MD;  Location: [facility] ENDOSCOPY;  Service: Gastroenterology;;   SENTINEL NODE BIOPSY N/A 09/11/2019   Procedure: SENTINEL NODE BIOPSY;  Surgeon: Lafonda Mosses, MD;  Location: [facility];  Service: Gynecology;  Laterality: N/A;   TUBAL LIGATION      Family History  Problem Relation Age of Onset   Diabetes Mother    Hyperlipidemia Mother    Heart disease Mother    Dementia Mother    Angina Father    Heart attack Father    Thyroid cancer Sister    Liver cancer Sister    Heart disease Brother         x 2 in 54s yo   Alzheimer's disease Maternal Grandmother    Stroke Neg Hx    Colon cancer Neg Hx    Esophageal cancer Neg Hx    Rectal cancer Neg Hx    Stomach cancer Neg Hx    Ovarian cancer Neg Hx    Endometrial cancer Neg Hx     Social History   Socioeconomic History   Marital status: Married    Spouse name: Not on file   Number of children: 6   Years of education: College   Highest education level: Not on file  Occupational History   Occupation: Control and instrumentation engineer   Occupation: TEACHER- [employer]    Employer: [employer]  Tobacco Use   Smoking status: Never   Smokeless tobacco: Never  Vaping Use   Vaping Use: Never used  Substance and Sexual Activity   Alcohol use: No   Drug use: No   Sexual activity: Not on file  Other Topics Concern   Not on file  Social History Narrative   Full Code status.  Insurance: BCBS   Social Determinants of Radio broadcast assistant Strain: Not on file  Food Insecurity: Not on file  Transportation Needs: Not on file  Physical Activity: Not on file  Stress: Not on file  Social Connections: Not on file    Current Medications:  Current Outpatient Medications:    acetaminophen (TYLENOL) 500 MG tablet, Take 500 mg by mouth 2 (two) times daily as needed (for pain)., Disp: , Rfl:    apixaban (ELIQUIS) 2.5 MG TABS tablet, Take 1 tablet (2.5 mg total) by mouth 2 (two) times daily., Disp: 60 tablet, Rfl: 5   atorvastatin (LIPITOR) 80 MG tablet, TAKE 1 TABLET BY MOUTH EVERY DAY, Disp: 90 tablet, Rfl: 2   cholecalciferol (VITAMIN D3) 25 MCG (1000 UNIT) tablet, Take 1,000 Units by mouth daily., Disp: , Rfl:    Ferrous Gluconate 239 (27 Fe) MG TABS, Take by mouth., Disp: , Rfl:    lisinopril (ZESTRIL) 40 MG tablet, Take 40 mg by mouth daily., Disp: , Rfl:    metoprolol succinate (TOPROL-XL) 25 MG 24 hr tablet, TAKE 1 TABLET BY MOUTH EVERY DAY, Disp: 90 tablet, Rfl: 3   Multiple Vitamin (MULTIVITAMIN WITH MINERALS)  TABS, Take 1 tablet by mouth every morning., Disp: , Rfl:    Naphazoline HCl (CLEAR EYES OP), Place 1 drop into both eyes as needed (for irritation or dryness). , Disp: , Rfl:    nitroGLYCERIN (NITROSTAT) 0.4 MG SL tablet, Place 1 tablet (0.4 mg total) under the tongue every 5 (five) minutes x 3 doses as needed for chest pain., Disp: 25 tablet, Rfl: 3   omeprazole (PRILOSEC) 20 MG capsule, Take 1 capsule (20 mg total) by mouth every other day., Disp: 45 capsule, Rfl: 3   potassium chloride (KLOR-CON) 10 MEQ tablet, Take 10 mEq by mouth daily., Disp: , Rfl:    sodium chloride (OCEAN) 0.65 % SOLN nasal spray, Place 1 spray into both nostrils as needed for congestion., Disp: , Rfl:    aspirin EC 81 MG tablet, Swallow whole. Take one tablet 3 times week. (Patient not taking: Reported on 09/24/2020), Disp: 90 tablet, Rfl: 3   furosemide (LASIX) 20 MG tablet, Take 20 mg by mouth daily., Disp: , Rfl:    ibuprofen (ADVIL) 200 MG tablet, Take 200 mg by mouth every 6 (six) hours as needed for headache or moderate pain.  (Patient not taking: Reported on 09/24/2020), Disp: , Rfl:   Review of Systems: Denies appetite changes, fevers, chills, fatigue, unexplained weight changes. Denies hearing loss, neck lumps or masses, mouth sores, ringing in ears or voice changes. Denies cough or wheezing.  Denies shortness of breath. Denies chest pain or palpitations. Denies leg swelling. Denies abdominal distention, pain, blood in stools, constipation, diarrhea, nausea, vomiting, or early satiety. Denies pain with intercourse, dysuria, frequency, hematuria or incontinence. Denies hot flashes, pelvic pain, vaginal bleeding or vaginal discharge.   Denies joint pain, back pain or muscle pain/cramps. Denies itching, rash, or wounds. Denies dizziness, headaches, numbness or seizures. Denies swollen lymph nodes or glands, denies easy bruising or bleeding. Denies anxiety, depression, confusion, or decreased  concentration.  Physical Exam: There were no vitals taken for this visit. General: ***Alert, oriented, no acute distress. HEENT: ***Posterior oropharynx clear, sclera anicteric. Chest: ***Clear to auscultation bilaterally.  ***Port site clean. Cardiovascular: ***Regular rate and rhythm, no murmurs. Abdomen: ***Obese, soft, nontender.  Normoactive bowel sounds.  No masses or hepatosplenomegaly appreciated.  ***Well-healed scar. Extremities: ***Grossly normal range of motion.  Warm,  well perfused.  No edema bilaterally. Skin: ***No rashes or lesions noted. Lymphatics: ***No cervical, supraclavicular, or inguinal adenopathy. GU: Normal appearing external genitalia without erythema, excoriation, or lesions.  Speculum exam reveals ***.  Bimanual exam reveals ***.  ***Rectovaginal exam  confirms ___.  Laboratory & Radiologic Studies:   Assessment & Plan: Kamori Kitchens is a 80 y.o. woman with Stage IA grade 1 low-risk endometrial cancer who presents for surveillance visit.   Patient is NED on exam today.  We discussed signs and symptoms again that would be concerning for disease recurrence and the patient understands to call if she develops any of these before her next scheduled visit.  Given her weight gain, I also reviewed the important role that estrogen plays in the pathogenesis of the cancer she had.  It is important from an overall health standpoint and also a cancer related survival that she work on weight loss.  She voices motivation to do this.   She has significant edema in her lower extremities today.  She notes not having worn compression socks the last couple of days.  She is seeing her primary care provider in January.  I have encouraged her to get tighter compression socks and to talk with her PCP about other interventions given her significant edema.   Per [facility] surveillance recommendations, we discussed that she will have surveillance visits every 6 months initially for the  first 1 year and then transition to yearly.   *** minutes of total time was spent for this patient encounter, including preparation, face-to-face counseling with the patient and coordination of care, and documentation of the encounter.  Jeral Pinch, MD  Division of Gynecologic Oncology  Department of Obstetrics and Gynecology  [facility]

## 2020-09-26 ENCOUNTER — Inpatient Hospital Stay: Payer: Medicare Other | Admitting: Gynecologic Oncology

## 2020-09-26 ENCOUNTER — Encounter: Payer: Self-pay | Admitting: Gynecologic Oncology

## 2020-09-26 ENCOUNTER — Telehealth: Payer: Self-pay | Admitting: *Deleted

## 2020-09-26 NOTE — Telephone Encounter (Signed)
Spoke with the patient's daughter and reschedule today's appt

## 2020-10-01 ENCOUNTER — Inpatient Hospital Stay: Payer: Medicare Other | Attending: Gynecologic Oncology | Admitting: Gynecologic Oncology

## 2020-10-01 ENCOUNTER — Other Ambulatory Visit: Payer: Self-pay

## 2020-10-01 VITALS — BP 155/73 | HR 58 | Temp 98.4°F | Resp 18 | Ht 63.0 in | Wt 244.2 lb

## 2020-10-01 DIAGNOSIS — Z86718 Personal history of other venous thrombosis and embolism: Secondary | ICD-10-CM | POA: Insufficient documentation

## 2020-10-01 DIAGNOSIS — I252 Old myocardial infarction: Secondary | ICD-10-CM | POA: Insufficient documentation

## 2020-10-01 DIAGNOSIS — K219 Gastro-esophageal reflux disease without esophagitis: Secondary | ICD-10-CM | POA: Diagnosis not present

## 2020-10-01 DIAGNOSIS — Z6841 Body Mass Index (BMI) 40.0 and over, adult: Secondary | ICD-10-CM | POA: Insufficient documentation

## 2020-10-01 DIAGNOSIS — Z8673 Personal history of transient ischemic attack (TIA), and cerebral infarction without residual deficits: Secondary | ICD-10-CM | POA: Insufficient documentation

## 2020-10-01 DIAGNOSIS — Z8542 Personal history of malignant neoplasm of other parts of uterus: Secondary | ICD-10-CM

## 2020-10-01 DIAGNOSIS — Z7901 Long term (current) use of anticoagulants: Secondary | ICD-10-CM | POA: Diagnosis not present

## 2020-10-01 DIAGNOSIS — E785 Hyperlipidemia, unspecified: Secondary | ICD-10-CM | POA: Insufficient documentation

## 2020-10-01 DIAGNOSIS — C541 Malignant neoplasm of endometrium: Secondary | ICD-10-CM

## 2020-10-01 DIAGNOSIS — I10 Essential (primary) hypertension: Secondary | ICD-10-CM | POA: Insufficient documentation

## 2020-10-01 DIAGNOSIS — I2510 Atherosclerotic heart disease of native coronary artery without angina pectoris: Secondary | ICD-10-CM | POA: Insufficient documentation

## 2020-10-01 DIAGNOSIS — Z79899 Other long term (current) drug therapy: Secondary | ICD-10-CM | POA: Diagnosis not present

## 2020-10-01 NOTE — Progress Notes (Signed)
Gynecologic Oncology Return Clinic Visit  10/01/20  Reason for Visit: surveillance visit in the setting of early-stage uterine cancer  Treatment History: Oncology History Overview Note  IHC MMR intact MSI stable    Endometrial cancer ([place])  02/26/2019 Initial Biopsy   Endometrial biopsy revealed grade 1 endometrioid adenocarcinoma   02/26/2019 Initial Diagnosis   Endometrial cancer ([place])   03/27/2019 Imaging   CT abdomen and pelvis: Diffuse endometrial thickening, consistent with known endometrial carcinoma.  No evidence of metastatic disease within the abdomen or pelvis.  Several small uterine fibroids largest measuring 4 cm.   03/28/2019 Surgery   D&C: Pathology shows endometrioid carcinoma, FIGO grade 1-2   03/28/2019 Treatment Plan Change   Mirena IUD inserted   07/02/2019 Pathology Results   EMB: focal Gr1 endometrioid adenocarcinoma Stromal changes c/w hormone effect   09/11/2019 Surgery   TRH/BSO, SLN  On EUA, 8-10cm uterus, mobile. On intra-abdominal entry, normal upper abdominal survey. Normal appearing small and large bowel, omentum. Normal appearing bilateral adnexa. Uterus and bulbous, especially at the fundus, with several 1-2cm intra-mural fibroids. Mapping successful to bilateral obturator nodes. IUD noted within the uterus after specimen removal. NO intra-abdominal or pelvic evidence of disease.   09/11/2019 Pathology Results   IA, grade 1 endometrioid, no MI, negative SLNs, no LVSI   09/11/2019 Cancer Staging   Staging form: Corpus Uteri - Carcinoma and Carcinosarcoma, AJCC 8th Edition - Clinical stage from 09/11/2019: FIGO Stage IA (cT1a, cN0, cM0) - Signed by Lafonda Mosses, MD on 09/17/2019      Interval History: The patient presents today for surveillance visit.  She notes overall doing well.  She denies any vaginal bleeding or discharge.  She reports continued baseline intermittent constipation, which responds well to tea and medication.  She continues  to have some urinary incontinence, which is unchanged, and described as occasional stress incontinence with movement or position changes and incontinence when she has to get up to go to the bathroom at night given how far the bathroom is from her bedroom.  She endorses a good appetite without nausea or emesis.  Her weight has fluctuated but overall not changed much.  She notes occasional pelvic pain that she describes as being like menstrual cramping.   Saw cardiologist on 4/6, had significant LE edema and was increased on her lasix for a brief period. ECHO showed EF of 60-65%. Seen again on 5/11.  Past Medical/Surgical History: Past Medical History:  Diagnosis Date   BMI 40.0-44.9, adult ([place])    CAD in native artery, stent to RCA BMS with MI 2015 11/04/2003   a. inferior STEMI 01/2014 s/p BMS to prox RCA.   DVT (deep venous thrombosis) ([place]) 2003   lower extremity DVT with questionable recurrence in 2005. Another admission 2015.   Endometrial cancer ([place])    Endometrial thickening on ultra sound 07/2009   path showing Fragments of benign endocervical and squamous mucosa. No dysplasia or malignancy // Followed by Dr. [name]   First degree AV block    Gastric ulcer    GERD (gastroesophageal reflux disease)    GI bleed    Hemorrhoid    Hyperlipidemia LDL goal <70    Hypertension    Insomnia 11/02/2010   Memory difficulties 11/02/2010   Mild dilation of ascending aorta ([place])    Morbid obesity ([place]) 09/16/2012   Obstructive sleep apnea 09/23/2010   Pre-diabetes    Pulmonary nodule    ST elevation myocardial infarction (STEMI) of inferior wall (  [place]) 01/24/2014   Stroke ([place]) 2015   Syncope 12/18/2013   Syncope, near 12/14/2018   TIA (transient ischemic attack) 09/23/2010   09/13/2010: CT, MRI/MRA no acute process. Carotid doppler: no stenosis. 2D-Echo Normal LV function with an EF 55-60%.    Trifascicular block    UTI (urinary tract infection) 01/27/2014    Past Surgical History:   Procedure Laterality Date   COLONOSCOPY W/ POLYPECTOMY  11/01/2007   8 mm rectal adenoma, hemorrhoids   CORONARY ANGIOPLASTY WITH STENT PLACEMENT  01/24/14   Inf. STEMI, BMS to RCA   [place] N/A 03/28/2019   Procedure: DILATATION AND CURETTAGE OF UTERUS;  Surgeon: Lafonda Mosses, MD;  Location: [facility];  Service: Gynecology;  Laterality: N/A;   ESOPHAGOGASTRODUODENOSCOPY N/A 10/06/2018   Procedure: ESOPHAGOGASTRODUODENOSCOPY (EGD);  Surgeon: Doran Stabler, MD;  Location: [facility];  Service: Gastroenterology;  Laterality: N/A;   HOT HEMOSTASIS N/A 10/06/2018   Procedure: HOT HEMOSTASIS (ARGON PLASMA COAGULATION/BICAP);  Surgeon: Doran Stabler, MD;  Location: [facility];  Service: Gastroenterology;  Laterality: N/A;   INTRAUTERINE DEVICE (IUD) INSERTION N/A 03/28/2019   Procedure: INTRAUTERINE DEVICE (IUD) INSERTION;  Surgeon: Lafonda Mosses, MD;  Location: [facility];  Service: Gynecology;  Laterality: N/A;   KNEE SURGERY  1996   Left knee arthroscopy.   LEFT HEART CATHETERIZATION WITH CORONARY ANGIOGRAM N/A 01/24/2014   Procedure: LEFT HEART CATHETERIZATION WITH CORONARY ANGIOGRAM;  Surgeon: Burnell Blanks, MD;  Location: [facility] CATH LAB;  Service: Cardiovascular;  Laterality: N/A;   ROBOTIC ASSISTED TOTAL HYSTERECTOMY WITH BILATERAL SALPINGO OOPHERECTOMY N/A 09/11/2019   Procedure: XI ROBOTIC ASSISTED TOTAL HYSTERECTOMY WITH BILATERAL SALPINGO OOPHORECTOMY, LYMPH NODE DISSECTION;  Surgeon: Lafonda Mosses, MD;  Location: [facility];  Service: Gynecology;  Laterality: N/A;   ROTATOR CUFF REPAIR     right.   SCLEROTHERAPY  10/06/2018   Procedure: SCLEROTHERAPY;  Surgeon: Doran Stabler, MD;  Location: [facility]OSCOPY;  Service: Gastroenterology;;   SENTINEL NODE BIOPSY N/A 09/11/2019   Procedure: SENTINEL NODE BIOPSY;  Surgeon: Lafonda Mosses, MD;  Location: [facility];  Service: Gynecology;  Laterality: N/A;   TUBAL LIGATION      Family History   Problem Relation Age of Onset   Diabetes Mother    Hyperlipidemia Mother    Heart disease Mother    Dementia Mother    Angina Father    Heart attack Father    Thyroid cancer Sister    Liver cancer Sister    Heart disease Brother        x 2 in 74s yo   Alzheimer's disease Maternal Grandmother    Stroke Neg Hx    Colon cancer Neg Hx    Esophageal cancer Neg Hx    Rectal cancer Neg Hx    Stomach cancer Neg Hx    Ovarian cancer Neg Hx    Endometrial cancer Neg Hx     Social History   Socioeconomic History   Marital status: Married    Spouse name: Not on file   Number of children: 6   Years of education: College   Highest education level: Not on file  Occupational History   Occupation: Control and instrumentation engineer   Occupation: TEACHER- [employer]    Employer: [employer]  Tobacco Use   Smoking status: Never   Smokeless tobacco: Never  Vaping Use   Vaping Use: Never used  Substance and Sexual Activity   Alcohol use: No  Drug use: No   Sexual activity: Not on file  Other Topics Concern   Not on file  Social History Narrative   Full Code status.   Insurance: BCBS   Social Determinants of Radio broadcast assistant Strain: Not on file  Food Insecurity: Not on file  Transportation Needs: Not on file  Physical Activity: Not on file  Stress: Not on file  Social Connections: Not on file    Current Medications:  Current Outpatient Medications:    acetaminophen (TYLENOL) 500 MG tablet, Take 500 mg by mouth 2 (two) times daily as needed (for pain)., Disp: , Rfl:    apixaban (ELIQUIS) 2.5 MG TABS tablet, Take 1 tablet (2.5 mg total) by mouth 2 (two) times daily., Disp: 60 tablet, Rfl: 5   atorvastatin (LIPITOR) 80 MG tablet, TAKE 1 TABLET BY MOUTH EVERY DAY, Disp: 90 tablet, Rfl: 2   busPIRone (BUSPAR) 7.5 MG tablet, Take 7.5 mg by mouth 2 (two) times daily., Disp: , Rfl:    cholecalciferol (VITAMIN D3) 25 MCG (1000 UNIT) tablet, Take 1,000 Units by mouth  daily., Disp: , Rfl:    Ferrous Gluconate 239 (27 Fe) MG TABS, Take by mouth., Disp: , Rfl:    furosemide (LASIX) 20 MG tablet, Take 20 mg by mouth daily., Disp: , Rfl:    lisinopril (ZESTRIL) 40 MG tablet, Take 40 mg by mouth daily., Disp: , Rfl:    metoprolol succinate (TOPROL-XL) 25 MG 24 hr tablet, TAKE 1 TABLET BY MOUTH EVERY DAY, Disp: 90 tablet, Rfl: 3   Multiple Vitamin (MULTIVITAMIN WITH MINERALS) TABS, Take 1 tablet by mouth every morning., Disp: , Rfl:    Naphazoline HCl (CLEAR EYES OP), Place 1 drop into both eyes as needed (for irritation or dryness). , Disp: , Rfl:    nitroGLYCERIN (NITROSTAT) 0.4 MG SL tablet, Place 1 tablet (0.4 mg total) under the tongue every 5 (five) minutes x 3 doses as needed for chest pain., Disp: 25 tablet, Rfl: 3   omeprazole (PRILOSEC) 20 MG capsule, Take 1 capsule (20 mg total) by mouth every other day., Disp: 45 capsule, Rfl: 3   potassium chloride (KLOR-CON) 10 MEQ tablet, Take 10 mEq by mouth daily., Disp: , Rfl:    sodium chloride (OCEAN) 0.65 % SOLN nasal spray, Place 1 spray into both nostrils as needed for congestion., Disp: , Rfl:    aspirin EC 81 MG tablet, Swallow whole. Take one tablet 3 times week. (Patient not taking: No sig reported), Disp: 90 tablet, Rfl: 3   ibuprofen (ADVIL) 200 MG tablet, Take 200 mg by mouth every 6 (six) hours as needed for headache or moderate pain.  (Patient not taking: No sig reported), Disp: , Rfl:   Review of Systems: Pertinent positives include ringing in ears, leg swelling, pelvic pain, joint pain, dizziness, anxiety. Denies appetite changes, fevers, chills, fatigue, unexplained weight changes. Denies hearing loss, neck lumps or masses, mouth sores or voice changes. Denies cough or wheezing.  Denies shortness of breath. Denies chest pain or palpitations.  Denies abdominal distention, pain, blood in stools, constipation, diarrhea, nausea, vomiting, or early satiety. Denies pain with intercourse, dysuria,  frequency, hematuria or incontinence. Denies hot flashes, vaginal bleeding or vaginal discharge.   Denies back pain or muscle pain/cramps. Denies itching, rash, or wounds. Denies headaches, numbness or seizures. Denies swollen lymph nodes or glands, denies easy bruising or bleeding. Denies depression, confusion, or decreased concentration.  Physical Exam: BP (!) 155/73 (BP Location: Right Wrist, Patient  Position: Sitting)   Pulse (!) 58   Temp 98.4 F (36.9 C)   Resp 18   Ht '5\' 3"'  (1.6 m)   Wt 244 lb 3.2 oz (110.8 kg)   SpO2 99%   BMI 43.26 kg/m  General: Alert, oriented, no acute distress. HEENT: Atraumatic, normocephalic, sclera anicteric. Chest: Unlabored breathing on room air. Abdomen: Obese, soft, nontender.  Normoactive bowel sounds.  No masses or hepatosplenomegaly appreciated.  Well-healed incisions. Extremities: Grossly normal range of motion.  Warm, well perfused.  1-2+ edema bilaterally. Skin: No rashes or lesions noted. Lymphatics: No cervical, supraclavicular, or inguinal adenopathy. GU: Normal appearing external genitalia without erythema, excoriation, or lesions.  Speculum exam reveals mildly atrophic vaginal mucosa, no lesions or masses.  No discharge or bleeding noted.  Bimanual exam reveals cuff intact, no masses or nodularity.  Rectovaginal exam confirms these findings.  Laboratory & Radiologic Studies: None new  Assessment & Plan: Shirley [name] is a 80 y.o. woman with Stage IA grade 1 low-risk endometrial cancer who presents for surveillance visit.   Patient is NED on exam today.  We discussed signs and symptoms again that would be concerning for disease recurrence and the patient understands to call if she develops any of these before her next scheduled visit.  We discussed the difference in surveillance recommendations between NCCN and SGO.  At this time, we will follow NCCN guidelines and continue with visits every 6 months.  When she is 2 years out  from definitive surgery, we will reassess transitioning to yearly visits.  Her weight is down almost 20 pounds from a recent visit, which I suspect is related to efforts to improve her lower extremity edema.  34 minutes of total time was spent for this patient encounter, including preparation, face-to-face counseling with the patient and coordination of care, and documentation of the encounter.  Jeral Pinch, MD  Division of Gynecologic Oncology  Department of Obstetrics and Gynecology  [facility]

## 2020-10-01 NOTE — Patient Instructions (Signed)
It is great to see you!  I do not see or feel any evidence of cancer on your exam today.  I will see you back in 6 months.  Please call if anything changes before or you develop new symptoms such as vaginal bleeding or pelvic pain.

## 2020-10-10 DIAGNOSIS — E1165 Type 2 diabetes mellitus with hyperglycemia: Secondary | ICD-10-CM | POA: Diagnosis not present

## 2020-10-10 DIAGNOSIS — E782 Mixed hyperlipidemia: Secondary | ICD-10-CM | POA: Diagnosis not present

## 2020-10-21 DIAGNOSIS — E1151 Type 2 diabetes mellitus with diabetic peripheral angiopathy without gangrene: Secondary | ICD-10-CM | POA: Diagnosis not present

## 2020-10-21 DIAGNOSIS — L84 Corns and callosities: Secondary | ICD-10-CM | POA: Diagnosis not present

## 2020-10-21 DIAGNOSIS — L603 Nail dystrophy: Secondary | ICD-10-CM | POA: Diagnosis not present

## 2020-12-03 NOTE — Progress Notes (Signed)
Cardiology Office Note    Date:  12/10/2020   ID:  Shirley [name], [place] [birth year], MRN [id number]   PCP:  Merrilee Seashore, [place]  Cardiologist:  Lauree Chandler, MD   Advanced Practice Provider:  No care team member to display Electrophysiologist:  None   [id number]   Chief Complaint  Patient presents with   Follow-up     History of Present Illness:  Shirley [name] is a 80 y.o. female  with history of CAD with inferior STEMI October 2015 treated with BMS to the RCA, no disease in the LAD or circumflex, low risk NST 09/27/2017 HTN, recurrent DVT on , prior CVA, prior GI bleeding with gastric ulcer 10/2018 and sleep apnea.  I saw the patient 07/09/2020 with fluid overload and was drinking hydration packets and Pedialyte daily.  Lasix and potassium were increased.  Echo showed normal LVEF 60 to 65% with grade 2 DD.  BNP was 64.  Follow-up 08/13/2020 weight was up 5 pounds but said her clothes were fitting looser.  She claims her home scale went from 258 pounds down to 241 pounds.  I did not make any changes that day.  Patient comes in for f/u without her daughter. She says her BP has been running high. 140-150/89. No headaches or blurred vision. Chronic knee pain. Sometimes she says her heart is racing and wakes her from sleep. No associated chest pian or dyspnea. Usually takes an aspirin or NTG. Uses her CPAP but having trouble with mask. Watching salt closely. Drinks 2 cups coffee  daily and occasional tea. Legs swollen but she says it's better. Wants to go to [facility]. Had to be put on anxiety medicine because of controlling husband. She used to be independent but now family doesn't allow her to drive or go anywhere so she sits all day long.  Past Medical History:  Diagnosis Date   BMI 40.0-44.9, adult ([place])    CAD in native artery, stent to RCA BMS with MI 2015 11/04/2003   a. inferior STEMI 01/2014 s/p BMS to prox RCA.    DVT (deep venous thrombosis) ([place]) 2003   lower extremity DVT with questionable recurrence in 2005. Another admission 2015.   Endometrial cancer ([place])    Endometrial thickening on ultra sound 07/2009   path showing Fragments of benign endocervical and squamous mucosa. No dysplasia or malignancy // Followed by Dr. [name]   First degree AV block    Gastric ulcer    GERD (gastroesophageal reflux disease)    GI bleed    Hemorrhoid    Hyperlipidemia LDL goal <70    Hypertension    Insomnia 11/02/2010   Memory difficulties 11/02/2010   Mild dilation of ascending aorta ([place])    Morbid obesity ([place]) 09/16/2012   Obstructive sleep apnea 09/23/2010   Pre-diabetes    Pulmonary nodule    ST elevation myocardial infarction (STEMI) of inferior wall ([place]) 01/24/2014   Stroke ([place]) 2015   Syncope 12/18/2013   Syncope, near 12/14/2018   TIA (transient ischemic attack) 09/23/2010   09/13/2010: CT, MRI/MRA no acute process. Carotid doppler: no stenosis. 2D-Echo Normal LV function with an EF 55-60%.    Trifascicular block    UTI (urinary tract infection) 01/27/2014    Past Surgical History:  Procedure Laterality Date   COLONOSCOPY W/ POLYPECTOMY  11/01/2007   8 mm rectal adenoma, hemorrhoids   CORONARY ANGIOPLASTY WITH STENT PLACEMENT  01/24/14   Inf. STEMI, BMS to RCA   [facility] N/A 03/28/2019   Procedure: DILATATION AND CURETTAGE OF UTERUS;  Surgeon: Lafonda Mosses, MD;  Location: [facility];  Service: Gynecology;  Laterality: N/A;   ESOPHAGOGASTRODUODENOSCOPY N/A 10/06/2018   Procedure: ESOPHAGOGASTRODUODENOSCOPY (EGD);  Surgeon: Doran Stabler, MD;  Location: [place];  Service: Gastroenterology;  Laterality: N/A;   HOT HEMOSTASIS N/A 10/06/2018   Procedure: HOT HEMOSTASIS (ARGON PLASMA COAGULATION/BICAP);  Surgeon: Doran Stabler, MD;  Location: [place];  Service: Gastroenterology;  Laterality: N/A;   INTRAUTERINE DEVICE (IUD) INSERTION N/A 03/28/2019    Procedure: INTRAUTERINE DEVICE (IUD) INSERTION;  Surgeon: Lafonda Mosses, MD;  Location: [facility];  Service: Gynecology;  Laterality: N/A;   KNEE SURGERY  1996   Left knee arthroscopy.   LEFT HEART CATHETERIZATION WITH CORONARY ANGIOGRAM N/A 01/24/2014   Procedure: LEFT HEART CATHETERIZATION WITH CORONARY ANGIOGRAM;  Surgeon: Burnell Blanks, MD;  Location[facility];  Service: Cardiovascular;  Laterality: N/A;   ROBOTIC ASSISTED TOTAL HYSTERECTOMY WITH BILATERAL SALPINGO OOPHERECTOMY N/A 09/11/2019   Procedure: XI ROBOTIC ASSISTED TOTAL HYSTERECTOMY WITH BILATERAL SALPINGO OOPHORECTOMY, LYMPH NODE DISSECTION;  Surgeon: Lafonda Mosses, MD;  Location: [facility];  Service: Gynecology;  Laterality: N/A;   ROTATOR CUFF REPAIR     right.   SCLEROTHERAPY  10/06/2018   Procedure: SCLEROTHERAPY;  Surgeon: Doran Stabler, MD;  Location: [facility]OSCOPY;  Service: Gastroenterology;;   SENTINEL NODE BIOPSY N/A 09/11/2019   Procedure: SENTINEL NODE BIOPSY;  Surgeon: Lafonda Mosses, MD;  Location: [facility];  Service: Gynecology;  Laterality: N/A;   TUBAL LIGATION      Current Medications: Current Meds  Medication Sig   acetaminophen (TYLENOL) 500 MG tablet Take 500 mg by mouth 2 (two) times daily as needed (for pain).   apixaban (ELIQUIS) 2.5 MG TABS tablet Take 1 tablet (2.5 mg total) by mouth 2 (two) times daily.   aspirin EC 81 MG tablet Swallow whole. Take one tablet 3 times week.   atorvastatin (LIPITOR) 80 MG tablet TAKE 1 TABLET BY MOUTH EVERY DAY   busPIRone (BUSPAR) 7.5 MG tablet Take 7.5 mg by mouth 2 (two) times daily.   cholecalciferol (VITAMIN D3) 25 MCG (1000 UNIT) tablet Take 1,000 Units by mouth daily.   Ferrous Gluconate 239 (27 Fe) MG TABS Take by mouth.   furosemide (LASIX) 20 MG tablet TAKE 40 MG (2 TABLETS) A DAY FOR 2 WEEKS AND THEN BACK TO 20 MG (1 TABLET) DAILY BY MOUTH   ibuprofen (ADVIL) 200 MG tablet Take 200 mg by mouth every 6 (six) hours as needed for headache  or moderate pain.   lisinopril (ZESTRIL) 40 MG tablet Take 40 mg by mouth daily.   metoprolol succinate (TOPROL XL) 25 MG 24 hr tablet Take 1 tablet (25 mg total) by mouth in the morning and at bedtime.   Multiple Vitamin (MULTIVITAMIN WITH MINERALS) TABS Take 1 tablet by mouth every morning.   Naphazoline HCl (CLEAR EYES OP) Place 1 drop into both eyes as needed (for irritation or dryness).    nitroGLYCERIN (NITROSTAT) 0.4 MG SL tablet Place 1 tablet (0.4 mg total) under the tongue every 5 (five) minutes x 3 doses as needed for chest pain.   omeprazole (PRILOSEC) 20 MG capsule Take 1 capsule (20 mg total) by mouth every other day.   potassium chloride (KLOR-CON) 10 MEQ tablet TAKE 20 MG (2 TABLETS) A DAY FOR  2 WEEKS AND THEN BACK TO 10 MG (1 TABLET) DAILY BY MOUTH   sodium chloride (OCEAN) 0.65 % SOLN nasal spray Place 1 spray into both nostrils as needed for congestion.   [DISCONTINUED] furosemide (LASIX) 20 MG tablet Take 20 mg by mouth daily.   [DISCONTINUED] metoprolol succinate (TOPROL-XL) 25 MG 24 hr tablet TAKE 1 TABLET BY MOUTH EVERY DAY   [DISCONTINUED] potassium chloride (KLOR-CON) 10 MEQ tablet Take 10 mEq by mouth daily.     Allergies:   Iohexol and Hydrochlorothiazide   Social History   Socioeconomic History   Marital status: Married    Spouse name: Not on file   Number of children: 6   Years of education: College   Highest education level: Not on file  Occupational History   Occupation: Control and instrumentation engineer   Occupation: TEACHER- [employer]    Employer: [employer]  Tobacco Use   Smoking status: Never   Smokeless tobacco: Never  Vaping Use   Vaping Use: Never used  Substance and Sexual Activity   Alcohol use: No   Drug use: No   Sexual activity: Not on file  Other Topics Concern   Not on file  Social History Narrative   Full Code status.   Insurance: BCBS   Social Determinants of Health   Financial Resource Strain: Not on file  Food  Insecurity: Not on file  Transportation Needs: Not on file  Physical Activity: Not on file  Stress: Not on file  Social Connections: Not on file     Family History:  The patient's  family history includes Alzheimer's disease in her maternal grandmother; Angina in her father; Dementia in her mother; Diabetes in her mother; Heart attack in her father; Heart disease in her brother and mother; Hyperlipidemia in her mother; Liver cancer in her sister; Thyroid cancer in her sister.   ROS:   Please see the history of present illness.    ROS All other systems reviewed and are negative.   PHYSICAL EXAM:   VS:  BP (!) 144/80   Pulse 74   Ht '5\' 3"'$  (1.6 m)   Wt 251 lb 12.8 oz (114.2 kg)   SpO2 97%   BMI 44.60 kg/m   Physical Exam  GEN: Obese, in no acute distress  Neck: no JVD, carotid bruits, or masses Cardiac:RRR; no murmurs, rubs, or gallops  Respiratory:  clear to auscultation bilaterally, normal work of breathing GI: soft, nontender, nondistended, + BS Ext: without cyanosis, clubbing, or edema, Good distal pulses bilaterally Neuro:  Alert and Oriented x 3 Psych: euthymic mood, full affect  Wt Readings from Last 3 Encounters:  12/10/20 251 lb 12.8 oz (114.2 kg)  10/01/20 244 lb 3.2 oz (110.8 kg)  08/19/20 262 lb (118.8 kg)      Studies/Labs Reviewed:   EKG:  EKG is  ordered today.  The ekg ordered today demonstrates NSR first degree AV block, RBBB, LAFB, septal infarct  Recent Labs: 07/09/2020: BUN 12; Creatinine, Ser 0.65; NT-Pro BNP 64; Potassium 4.2; Sodium 137   Lipid Panel    Component Value Date/Time   CHOL 129 10/17/2019 0914   TRIG 50 10/17/2019 0914   HDL 47 10/17/2019 0914   CHOLHDL 2.7 10/17/2019 0914   CHOLHDL 4 03/19/2014 0745   VLDL 22.0 03/19/2014 0745   LDLCALC 71 10/17/2019 0914    Additional studies/ records that were reviewed today include:  Echo 08/07/2020: IMPRESSIONS     1. Left ventricular ejection fraction, by estimation,  is 60 to 65%. The   left ventricle has normal function. The left ventricle has no regional  wall motion abnormalities. Left ventricular diastolic parameters are  consistent with Grade II diastolic  dysfunction (pseudonormalization).   2. Right ventricular systolic function is normal. The right ventricular  size is normal.   3. The mitral valve is normal in structure. No evidence of mitral valve  regurgitation. No evidence of mitral stenosis.   4. The aortic valve is normal in structure. Aortic valve regurgitation is  not visualized. No aortic stenosis is present.   5. There is mild dilatation of the ascending aorta, measuring 43 mm.   6. The inferior vena cava is normal in size with greater than 50%  respiratory variability, suggesting right atrial pressure of 3 mmHg.   Comparison(s): Prior images unable to be directly viewed, comparison made  by report only.      Echo 01/24/14:  Left ventricle: The cavity size was normal. There was mild   concentric hypertrophy. Systolic function was normal. The   estimated ejection fraction was in the range of 55% to 60%. Wall   motion was normal; there were no regional wall motion   abnormalities. - Aortic valve: There was trivial regurgitation. - Ascending aorta: The ascending aorta was mildly dilated. - Mitral valve: Calcified annulus.   Cardiac cath 01/24/14: Left main: No obstructive disease.   Left Anterior Descending Artery: Large caliber vessel that courses to the apex. Moderate caliber diagonal branch. No obstructive disease.   Circumflex Artery: Large caliber vessel with large obtuse marginal branch. No obstructive disease.   Right Coronary Artery: Moderate caliber co-dominant vessel with sub-totally occluded proximal vessel.  Left Ventricular Angiogram: LVEF=50%         Risk Assessment/Calculations:         ASSESSMENT:    1. Palpitations   2. Lower extremity edema   3. CAD in native artery   4. Primary hypertension   5. History of DVT of  lower extremity   6. History of CVA (cerebrovascular accident)   7. History of GI bleed   8. Obstructive sleep apnea      PLAN:  In order of problems listed above:  Palpitations waking her from sleep. A lot of stress at home. Has declined counseling. Will increase Toprol XL 25 mg bid. See back and if not better holter monitor.  LE edema-still has swelling but patient says it's better. Will increase lasix '40mg'$  daily for a week K 20 meq daily for a week. 2 gm sodium diet   CAD status post inferior STEMI 01/2014 treated with BMS to the proximal RCA, no disease in the LAD or circumflex, low risk NST 09/2017-no angina   Hypertension BP running a little high-increased Toprol XL   Recurrent DVTs on Eliquis   Prior CVA on ASA 3 times a week with eliquis   History of GI bleed 10/2018-no recent problems   OSA using her CPAP but mask doesn't fit. Will have Gae Bon check. She hasn't been followed by anyone for this.  Anxiety over home situation-offered counseling but patient declined.    Shared Decision Making/Informed Consent        Medication Adjustments/Labs and Tests Ordered: Current medicines are reviewed at length with the patient today.  Concerns regarding medicines are outlined above.  Medication changes, Labs and Tests ordered today are listed in the Patient Instructions below. Patient Instructions    Medication Instructions:  Your physician has recommended you make the following change  in your medication:  INCREASE METOPROLOL TO 1 TABLET IN THE AM AND 1 TABLET IN THE PM DAILY.  INCREASE LASIX TO 40 MG (2 TABLETS) A DAY FOR 2 WEEKS AND THEN BACK TO 20 MG (1 TABLET) DAILY  INCREASE POTASSIUM TO 20 MG (2 TABLETS) A DAY FOR 2 WEEKS AND THEN BACK TO 10 MG (1 TABLET) DAILY  *If you need a refill on your cardiac medications before your next appointment, please call your pharmacy*   Lab Work: NONE If you have labs (blood work) drawn today and your tests are completely normal, you  will receive your results only by: MyChart Message (if you have MyChart) OR A paper copy in the mail If you have any lab test that is abnormal or we need to change your treatment, we will call you to review the results.   Testing/Procedures: NONE   Follow-Up: At [facility], you and your health needs are our priority.  As part of our continuing mission to provide you with exceptional heart care, we have created designated Provider Care Teams.  These Care Teams include your primary Cardiologist (physician) and Advanced Practice Providers (APPs -  Physician Assistants and Nurse Practitioners) who all work together to provide you with the care you need, when you need it.  We recommend signing up for the patient portal called "MyChart".  Sign up information is provided on this After Visit Summary.  MyChart is used to connect with patients for Virtual Visits (Telemedicine).  Patients are able to view lab/test results, encounter notes, upcoming appointments, etc.  Non-urgent messages can be sent to your provider as well.   To learn more about what you can do with MyChart, go to [email or web address].    Your next appointment:   3 month(s)  The format for your next appointment:   In Person  Provider:   You may see Lauree Chandler, MD or Ermalinda Barrios, PA-C   Other Instructions   Signed, Ermalinda Barrios, PA-C  12/10/2020 2:24 PM    [facility] [address], [place], [place]  [address] Phone: [phone]; Fax: [phone]

## 2020-12-08 NOTE — Progress Notes (Deleted)
[place]   [phone] Fax:(336) [phone]   Clinic Follow up Note   Patient Care Team: Merrilee Seashore, MD as PCP - General (Internal Medicine) Burnell Blanks, MD as PCP - Cardiology (Cardiology) Truitt Merle, MD as Consulting Physician (Hematology) Lafonda Mosses, MD as Consulting Physician (Gynecologic Oncology) 12/08/2020  CHIEF COMPLAINT: F/up recurrent DVT, h/o GI bleed on AC, and h/o uterine cancer  SUMMARY OF ONCOLOGIC HISTORY: Oncology History Overview Note  IHC MMR intact MSI stable    Endometrial cancer ([place])  02/26/2019 Initial Biopsy   Endometrial biopsy revealed grade 1 endometrioid adenocarcinoma   02/26/2019 Initial Diagnosis   Endometrial cancer ([place])   03/27/2019 Imaging   CT abdomen and pelvis: Diffuse endometrial thickening, consistent with known endometrial carcinoma.  No evidence of metastatic disease within the abdomen or pelvis.  Several small uterine fibroids largest measuring 4 cm.   03/28/2019 Surgery   D&C: Pathology shows endometrioid carcinoma, FIGO grade 1-2   03/28/2019 Treatment Plan Change   Mirena IUD inserted   07/02/2019 Pathology Results   EMB: focal Gr1 endometrioid adenocarcinoma Stromal changes c/w hormone effect   09/11/2019 Surgery   TRH/BSO, SLN  On EUA, 8-10cm uterus, mobile. On intra-abdominal entry, normal upper abdominal survey. Normal appearing small and large bowel, omentum. Normal appearing bilateral adnexa. Uterus and bulbous, especially at the fundus, with several 1-2cm intra-mural fibroids. Mapping successful to bilateral obturator nodes. IUD noted within the uterus after specimen removal. NO intra-abdominal or pelvic evidence of disease.   09/11/2019 Pathology Results   IA, grade 1 endometrioid, no MI, negative SLNs, no LVSI   09/11/2019 Cancer Staging   Staging form: Corpus Uteri - Carcinoma and Carcinosarcoma, AJCC 8th Edition - Clinical stage from 09/11/2019: FIGO Stage IA  (cT1a, cN0, cM0) - Signed by Lafonda Mosses, MD on 09/17/2019     CURRENT THERAPY: Anticoagulation with Eliquis   INTERVAL HISTORY: [name]. Shirley [name] returns for f/up as scheduled. Last seen by Dr. Burr Medico 12/2019, pt continues eliquis ***. Bleeding ***. Recently seen by Dr. Berline Lopes 10/01/20 for uterine cancer surveillance visit.    REVIEW OF SYSTEMS:   Constitutional: Denies fevers, chills or abnormal weight loss Eyes: Denies blurriness of vision Ears, nose, mouth, throat, and face: Denies mucositis or sore throat Respiratory: Denies cough, dyspnea or wheezes Cardiovascular: Denies palpitation, chest discomfort or lower extremity swelling Gastrointestinal:  Denies nausea, heartburn or change in bowel habits Skin: Denies abnormal skin rashes Lymphatics: Denies new lymphadenopathy or easy bruising Neurological:Denies numbness, tingling or new weaknesses Behavioral/Psych: Mood is stable, no new changes  All other systems were reviewed with the patient and are negative.  MEDICAL HISTORY:  Past Medical History:  Diagnosis Date   BMI 40.0-44.9, adult ([place])    CAD in native artery, stent to RCA BMS with MI 2015 11/04/2003   a. inferior STEMI 01/2014 s/p BMS to prox RCA.   DVT (deep venous thrombosis) ([place]) 2003   lower extremity DVT with questionable recurrence in 2005. Another admission 2015.   Endometrial cancer ([place])    Endometrial thickening on ultra sound 07/2009   path showing Fragments of benign endocervical and squamous mucosa. No dysplasia or malignancy // Followed by Dr. [name]   First degree AV block    Gastric ulcer    GERD (gastroesophageal reflux disease)    GI bleed    Hemorrhoid    Hyperlipidemia LDL goal <70    Hypertension    Insomnia 11/02/2010  Memory difficulties 11/02/2010   Mild dilation of ascending aorta ([place])    Morbid obesity ([place]) 09/16/2012   Obstructive sleep apnea 09/23/2010   Pre-diabetes    Pulmonary nodule    ST elevation myocardial  infarction (STEMI) of inferior wall ([place]) 01/24/2014   Stroke ([place]) 2015   Syncope 12/18/2013   Syncope, near 12/14/2018   TIA (transient ischemic attack) 09/23/2010   09/13/2010: CT, MRI/MRA no acute process. Carotid doppler: no stenosis. 2D-Echo Normal LV function with an EF 55-60%.    Trifascicular block    UTI (urinary tract infection) 01/27/2014    SURGICAL HISTORY: Past Surgical History:  Procedure Laterality Date   COLONOSCOPY W/ POLYPECTOMY  11/01/2007   8 mm rectal adenoma, hemorrhoids   CORONARY ANGIOPLASTY WITH STENT PLACEMENT  01/24/14   Inf. STEMI, BMS to RCA   [place] N/A 03/28/2019   Procedure: DILATATION AND CURETTAGE OF UTERUS;  Surgeon: Lafonda Mosses, MD;  Location: [facility];  Service: Gynecology;  Laterality: N/A;   ESOPHAGOGASTRODUODENOSCOPY N/A 10/06/2018   Procedure: ESOPHAGOGASTRODUODENOSCOPY (EGD);  Surgeon: Doran Stabler, MD;  Location: [facility];  Service: Gastroenterology;  Laterality: N/A;   HOT HEMOSTASIS N/A 10/06/2018   Procedure: HOT HEMOSTASIS (ARGON PLASMA COAGULATION/BICAP);  Surgeon: Doran Stabler, MD;  Location: [facility];  Service: Gastroenterology;  Laterality: N/A;   INTRAUTERINE DEVICE (IUD) INSERTION N/A 03/28/2019   Procedure: INTRAUTERINE DEVICE (IUD) INSERTION;  Surgeon: Lafonda Mosses, MD;  Location: [facility];  Service: Gynecology;  Laterality: N/A;   KNEE SURGERY  1996   Left knee arthroscopy.   LEFT HEART CATHETERIZATION WITH CORONARY ANGIOGRAM N/A 01/24/2014   Procedure: LEFT HEART CATHETERIZATION WITH CORONARY ANGIOGRAM;  Surgeon: Burnell Blanks, MD;  Location: [facility] C[facility];  Service: Cardiovascular;  Laterality: N/A;   ROBOTIC ASSISTED TOTAL HYSTERECTOMY WITH BILATERAL SALPINGO OOPHERECTOMY N/A 09/11/2019   Procedure: XI ROBOTIC ASSISTED TOTAL HYSTERECTOMY WITH BILATERAL SALPINGO OOPHORECTOMY, LYMPH NODE DISSECTION;  Surgeon: Lafonda Mosses, MD;  Location: [facility];  Service: Gynecology;   Laterality: N/A;   ROTATOR CUFF REPAIR     right.   SCLEROTHERAPY  10/06/2018   Procedure: SCLEROTHERAPY;  Surgeon: Doran Stabler, MD;  Location: [facility];  Service: Gastroenterology;;   SENTINEL NODE BIOPSY N/A 09/11/2019   Procedure: SENTINEL NODE BIOPSY;  Surgeon: Lafonda Mosses, MD;  Location: [facility];  Service: Gynecology;  Laterality: N/A;   TUBAL LIGATION      I have reviewed the social history and family history with the patient and they are unchanged from previous note.  ALLERGIES:  is allergic to iohexol and hydrochlorothiazide.  MEDICATIONS:  Current Outpatient Medications  Medication Sig Dispense Refill   acetaminophen (TYLENOL) 500 MG tablet Take 500 mg by mouth 2 (two) times daily as needed (for pain).     apixaban (ELIQUIS) 2.5 MG TABS tablet Take 1 tablet (2.5 mg total) by mouth 2 (two) times daily. 60 tablet 5   aspirin EC 81 MG tablet Swallow whole. Take one tablet 3 times week. (Patient not taking: No sig reported) 90 tablet 3   atorvastatin (LIPITOR) 80 MG tablet TAKE 1 TABLET BY MOUTH EVERY DAY 90 tablet 2   busPIRone (BUSPAR) 7.5 MG tablet Take 7.5 mg by mouth 2 (two) times daily.     cholecalciferol (VITAMIN D3) 25 MCG (1000 UNIT) tablet Take 1,000 Units by mouth daily.     Ferrous Gluconate 239 (27 Fe) MG TABS Take by mouth.  furosemide (LASIX) 20 MG tablet Take 20 mg by mouth daily.     ibuprofen (ADVIL) 200 MG tablet Take 200 mg by mouth every 6 (six) hours as needed for headache or moderate pain.  (Patient not taking: No sig reported)     lisinopril (ZESTRIL) 40 MG tablet Take 40 mg by mouth daily.     metoprolol succinate (TOPROL-XL) 25 MG 24 hr tablet TAKE 1 TABLET BY MOUTH EVERY DAY 90 tablet 3   Multiple Vitamin (MULTIVITAMIN WITH MINERALS) TABS Take 1 tablet by mouth every morning.     Naphazoline HCl (CLEAR EYES OP) Place 1 drop into both eyes as needed (for irritation or dryness).      nitroGLYCERIN (NITROSTAT) 0.4 MG SL tablet Place 1  tablet (0.4 mg total) under the tongue every 5 (five) minutes x 3 doses as needed for chest pain. 25 tablet 3   omeprazole (PRILOSEC) 20 MG capsule Take 1 capsule (20 mg total) by mouth every other day. 45 capsule 3   potassium chloride (KLOR-CON) 10 MEQ tablet Take 10 mEq by mouth daily.     sodium chloride (OCEAN) 0.65 % SOLN nasal spray Place 1 spray into both nostrils as needed for congestion.     No current facility-administered medications for this visit.    PHYSICAL EXAMINATION: ECOG PERFORMANCE STATUS: {CHL ONC ECOG PS:[phone]}  There were no vitals filed for this visit. There were no vitals filed for this visit.  GENERAL:alert, no distress and comfortable SKIN: skin color, texture, turgor are normal, no rashes or significant lesions EYES: normal, Conjunctiva are pink and non-injected, sclera clear OROPHARYNX:no exudate, no erythema and lips, buccal mucosa, and tongue normal  NECK: supple, thyroid normal size, non-tender, without nodularity LYMPH:  no palpable lymphadenopathy in the cervical, axillary or inguinal LUNGS: clear to auscultation and percussion with normal breathing effort HEART: regular rate & rhythm and no murmurs and no lower extremity edema ABDOMEN:abdomen soft, non-tender and normal bowel sounds Musculoskeletal:no cyanosis of digits and no clubbing  NEURO: alert & oriented x 3 with fluent speech, no focal motor/sensory deficits  LABORATORY DATA:  I have reviewed the data as listed CBC Latest Ref Rng & Units 12/06/2019 10/17/2019 10/05/2019  WBC 4.0 - 10.5 K/uL 8.5 12.2(H) 10.9(H)  Hemoglobin 12.0 - 15.0 g/dL 12.8 13.3 13.7  Hematocrit 36.0 - 46.0 % 40.0 41.2 42.7  Platelets 150 - 400 K/uL 206 267 250     CMP Latest Ref Rng & Units 07/09/2020 12/06/2019 10/17/2019  Glucose 65 - 99 mg/dL 116(H) 162(H) 151(H)  BUN 8 - 27 mg/dL '12 12 13  ' Creatinine 0.57 - 1.00 mg/dL 0.65 0.69 0.57  Sodium 134 - 144 mmol/L 137 140 133(L)  Potassium 3.5 - 5.2 mmol/L 4.2 4.0 4.1   Chloride 96 - 106 mmol/L 101 107 97  CO2 20 - 29 mmol/L 21 27 19(L)  Calcium 8.7 - 10.3 mg/dL 9.2 9.8 9.4  Total Protein 6.5 - 8.1 g/dL - 6.4(L) -  Total Bilirubin 0.3 - 1.2 mg/dL - 0.7 -  Alkaline Phos 38 - 126 U/L - 130(H) -  AST 15 - 41 U/L - 14(L) -  ALT 0 - 44 U/L - 18 -      RADIOGRAPHIC STUDIES: I have personally reviewed the radiological images as listed and agreed with the findings in the report. No results found.   ASSESSMENT & PLAN:  No problem-specific Assessment & Plan notes found for this encounter.   No orders of the defined types were  placed in this encounter.  All questions were answered. The patient knows to call the clinic with any problems, questions or concerns. No barriers to learning was detected. I spent {CHL ONC TIME VISIT - MNARU:[id number]} counseling the patient face to face. The total time spent in the appointment was {CHL ONC TIME VISIT - OYPOD:[id number]} and more than 50% was on counseling and review of test results     Alla Feeling, NP 12/08/20

## 2020-12-09 ENCOUNTER — Ambulatory Visit: Payer: Medicare Other | Admitting: Nurse Practitioner

## 2020-12-09 ENCOUNTER — Other Ambulatory Visit: Payer: Medicare Other

## 2020-12-10 ENCOUNTER — Other Ambulatory Visit: Payer: Self-pay | Admitting: Cardiovascular Disease

## 2020-12-10 ENCOUNTER — Encounter: Payer: Self-pay | Admitting: Physician Assistant

## 2020-12-10 ENCOUNTER — Ambulatory Visit (INDEPENDENT_AMBULATORY_CARE_PROVIDER_SITE_OTHER): Payer: Medicare Other | Admitting: Physician Assistant

## 2020-12-10 ENCOUNTER — Other Ambulatory Visit: Payer: Self-pay

## 2020-12-10 VITALS — BP 144/80 | HR 74 | Ht 63.0 in | Wt 251.8 lb

## 2020-12-10 DIAGNOSIS — I2510 Atherosclerotic heart disease of native coronary artery without angina pectoris: Secondary | ICD-10-CM | POA: Diagnosis not present

## 2020-12-10 DIAGNOSIS — R002 Palpitations: Secondary | ICD-10-CM

## 2020-12-10 DIAGNOSIS — Z8719 Personal history of other diseases of the digestive system: Secondary | ICD-10-CM | POA: Diagnosis not present

## 2020-12-10 DIAGNOSIS — G4733 Obstructive sleep apnea (adult) (pediatric): Secondary | ICD-10-CM

## 2020-12-10 DIAGNOSIS — I10 Essential (primary) hypertension: Secondary | ICD-10-CM | POA: Diagnosis not present

## 2020-12-10 DIAGNOSIS — R600 Localized edema: Secondary | ICD-10-CM

## 2020-12-10 DIAGNOSIS — Z86718 Personal history of other venous thrombosis and embolism: Secondary | ICD-10-CM

## 2020-12-10 DIAGNOSIS — Z8673 Personal history of transient ischemic attack (TIA), and cerebral infarction without residual deficits: Secondary | ICD-10-CM

## 2020-12-10 MED ORDER — METOPROLOL SUCCINATE ER 25 MG PO TB24
25.0000 mg | ORAL_TABLET | Freq: Two times a day (BID) | ORAL | 3 refills | Status: DC
Start: 2020-12-10 — End: 2021-09-15

## 2020-12-10 MED ORDER — FUROSEMIDE 20 MG PO TABS
ORAL_TABLET | ORAL | 3 refills | Status: DC
Start: 2020-12-10 — End: 2021-09-15

## 2020-12-10 MED ORDER — POTASSIUM CHLORIDE ER 10 MEQ PO TBCR
EXTENDED_RELEASE_TABLET | ORAL | 3 refills | Status: DC
Start: 2020-12-10 — End: 2021-09-15

## 2020-12-10 NOTE — Patient Instructions (Addendum)
   Medication Instructions:  Your physician has recommended you make the following change in your medication:  INCREASE METOPROLOL TO 1 TABLET IN THE AM AND 1 TABLET IN THE PM DAILY.  INCREASE LASIX TO 40 MG (2 TABLETS) A DAY FOR 2 WEEKS AND THEN BACK TO 20 MG (1 TABLET) DAILY  INCREASE POTASSIUM TO 20 MG (2 TABLETS) A DAY FOR 2 WEEKS AND THEN BACK TO 10 MG (1 TABLET) DAILY  *If you need a refill on your cardiac medications before your next appointment, please call your pharmacy*   Lab Work: NONE If you have labs (blood work) drawn today and your tests are completely normal, you will receive your results only by: MyChart Message (if you have MyChart) OR A paper copy in the mail If you have any lab test that is abnormal or we need to change your treatment, we will call you to review the results.   Testing/Procedures: NONE   Follow-Up: At [facility], you and your health needs are our priority.  As part of our continuing mission to provide you with exceptional heart care, we have created designated Provider Care Teams.  These Care Teams include your primary Cardiologist (physician) and Advanced Practice Providers (APPs -  Physician Assistants and Nurse Practitioners) who all work together to provide you with the care you need, when you need it.  We recommend signing up for the patient portal called "MyChart".  Sign up information is provided on this After Visit Summary.  MyChart is used to connect with patients for Virtual Visits (Telemedicine).  Patients are able to view lab/test results, encounter notes, upcoming appointments, etc.  Non-urgent messages can be sent to your provider as well.   To learn more about what you can do with MyChart, go to [email or web address].    Your next appointment:   3 month(s)  The format for your next appointment:   In Person  Provider:   You may see Lauree Chandler, MD or Ermalinda Barrios, PA-C   Other Instructions

## 2020-12-16 ENCOUNTER — Inpatient Hospital Stay: Payer: Medicare Other | Attending: Gynecologic Oncology

## 2020-12-16 ENCOUNTER — Inpatient Hospital Stay (HOSPITAL_BASED_OUTPATIENT_CLINIC_OR_DEPARTMENT_OTHER): Payer: Medicare Other | Admitting: Nurse Practitioner

## 2020-12-16 ENCOUNTER — Encounter: Payer: Self-pay | Admitting: Nurse Practitioner

## 2020-12-16 ENCOUNTER — Other Ambulatory Visit: Payer: Self-pay

## 2020-12-16 VITALS — BP 163/67 | HR 65 | Temp 98.1°F | Resp 19 | Ht 63.0 in | Wt 251.7 lb

## 2020-12-16 DIAGNOSIS — Z86718 Personal history of other venous thrombosis and embolism: Secondary | ICD-10-CM

## 2020-12-16 DIAGNOSIS — I82409 Acute embolism and thrombosis of unspecified deep veins of unspecified lower extremity: Secondary | ICD-10-CM | POA: Diagnosis not present

## 2020-12-16 DIAGNOSIS — Z8673 Personal history of transient ischemic attack (TIA), and cerebral infarction without residual deficits: Secondary | ICD-10-CM | POA: Insufficient documentation

## 2020-12-16 DIAGNOSIS — I119 Hypertensive heart disease without heart failure: Secondary | ICD-10-CM | POA: Diagnosis not present

## 2020-12-16 DIAGNOSIS — I252 Old myocardial infarction: Secondary | ICD-10-CM | POA: Insufficient documentation

## 2020-12-16 DIAGNOSIS — C541 Malignant neoplasm of endometrium: Secondary | ICD-10-CM | POA: Insufficient documentation

## 2020-12-16 DIAGNOSIS — Z79899 Other long term (current) drug therapy: Secondary | ICD-10-CM | POA: Diagnosis not present

## 2020-12-16 DIAGNOSIS — I2510 Atherosclerotic heart disease of native coronary artery without angina pectoris: Secondary | ICD-10-CM | POA: Diagnosis not present

## 2020-12-16 DIAGNOSIS — Z7901 Long term (current) use of anticoagulants: Secondary | ICD-10-CM | POA: Insufficient documentation

## 2020-12-16 LAB — CMP (CANCER CENTER ONLY)
ALT: 20 U/L (ref 0–44)
AST: 15 U/L (ref 15–41)
Albumin: 3.2 g/dL — ABNORMAL LOW (ref 3.5–5.0)
Alkaline Phosphatase: 130 U/L — ABNORMAL HIGH (ref 38–126)
Anion gap: 9 (ref 5–15)
BUN: 10 mg/dL (ref 8–23)
CO2: 27 mmol/L (ref 22–32)
Calcium: 9.2 mg/dL (ref 8.9–10.3)
Chloride: 104 mmol/L (ref 98–111)
Creatinine: 0.72 mg/dL (ref 0.44–1.00)
GFR, Estimated: >60 mL/min (ref >60)
Glucose, Bld: 166 mg/dL — ABNORMAL HIGH (ref 70–99)
Potassium: 4.5 mmol/L (ref 3.5–5.1)
Sodium: 140 mmol/L (ref 135–145)
Total Bilirubin: 0.7 mg/dL (ref 0.3–1.2)
Total Protein: 6.4 g/dL — ABNORMAL LOW (ref 6.5–8.1)

## 2020-12-16 LAB — CBC WITH DIFFERENTIAL (CANCER CENTER ONLY)
Abs Immature Granulocytes: 0.02 10*3/uL (ref 0.00–0.07)
Basophils Absolute: 0.1 10*3/uL (ref 0.0–0.1)
Basophils Relative: 1 %
Eosinophils Absolute: 0.3 10*3/uL (ref 0.0–0.5)
Eosinophils Relative: 3 %
HCT: 43.5 % (ref 36.0–46.0)
Hemoglobin: 14.1 g/dL (ref 12.0–15.0)
Immature Granulocytes: 0 %
Lymphocytes Relative: 31 %
Lymphs Abs: 2.7 10*3/uL (ref 0.7–4.0)
MCH: 28.5 pg (ref 26.0–34.0)
MCHC: 32.4 g/dL (ref 30.0–36.0)
MCV: 88.1 fL (ref 80.0–100.0)
Monocytes Absolute: 0.6 10*3/uL (ref 0.1–1.0)
Monocytes Relative: 6 %
Neutro Abs: 5.1 10*3/uL (ref 1.7–7.7)
Neutrophils Relative %: 59 %
Platelet Count: 227 10*3/uL (ref 150–400)
RBC: 4.94 MIL/uL (ref 3.87–5.11)
RDW: 13.6 % (ref 11.5–15.5)
WBC Count: 8.7 10*3/uL (ref 4.0–10.5)
nRBC: 0 % (ref 0.0–0.2)

## 2020-12-16 MED ORDER — APIXABAN 2.5 MG PO TABS: 2.5000 mg | ORAL_TABLET | Freq: Two times a day (BID) | ORAL | 5 refills | Status: DC

## 2020-12-16 NOTE — Progress Notes (Signed)
[place]   Telephone:([phone] Fax:(336) [phone]   Clinic Follow up Note   Patient Care Team: Merrilee Seashore, MD as PCP - General (Internal Medicine) Burnell Blanks, MD as PCP - Cardiology (Cardiology) Truitt Merle, MD as Consulting Physician (Hematology) Lafonda Mosses, MD as Consulting Physician (Gynecologic Oncology) 12/16/2020  CHIEF COMPLAINT: Follow-up recurrent DVT and h/o GI bleed on anticoagulation  SUMMARY OF ONCOLOGIC HISTORY: Oncology History Overview Note  IHC MMR intact MSI stable    Endometrial cancer ([place])  02/26/2019 Initial Biopsy   Endometrial biopsy revealed grade 1 endometrioid adenocarcinoma   02/26/2019 Initial Diagnosis   Endometrial cancer ([place])   03/27/2019 Imaging   CT abdomen and pelvis: Diffuse endometrial thickening, consistent with known endometrial carcinoma.  No evidence of metastatic disease within the abdomen or pelvis.  Several small uterine fibroids largest measuring 4 cm.   03/28/2019 Surgery   D&C: Pathology shows endometrioid carcinoma, FIGO grade 1-2   03/28/2019 Treatment Plan Change   Mirena IUD inserted   07/02/2019 Pathology Results   EMB: focal Gr1 endometrioid adenocarcinoma Stromal changes c/w hormone effect   09/11/2019 Surgery   TRH/BSO, SLN  On EUA, 8-10cm uterus, mobile. On intra-abdominal entry, normal upper abdominal survey. Normal appearing small and large bowel, omentum. Normal appearing bilateral adnexa. Uterus and bulbous, especially at the fundus, with several 1-2cm intra-mural fibroids. Mapping successful to bilateral obturator nodes. IUD noted within the uterus after specimen removal. NO intra-abdominal or pelvic evidence of disease.   09/11/2019 Pathology Results   IA, grade 1 endometrioid, no MI, negative SLNs, no LVSI   09/11/2019 Cancer Staging   Staging form: Corpus Uteri - Carcinoma and Carcinosarcoma, AJCC 8th Edition - Clinical stage from 09/11/2019: FIGO Stage IA  (cT1a, cN0, cM0) - Signed by Lafonda Mosses, MD on 09/17/2019     CURRENT THERAPY: Eliquis 2.5 mg twice daily, has been taking 2.5 daily for past 6 months   INTERVAL HISTORY: [name] presents for follow-up as scheduled.  She was last seen by Dr. Burr Medico 12/06/2019.  Last seen by Dr. Berline Lopes 10/01/2020 with no evidence of disease and cardiology follow-up 12/10/2020.  BP is difficult to manage, on medication.  Takes aspirin occasionally when blood pressure is high. She is doing well overall, lifestyle is a mix of sitting activities and being outside in her garden. Ambulates with a cane.  Currently taking Eliquis 2.5 mg daily for past 6 months.  Leg edema is improving, left leg greater than right at baseline.  Denies calf pain.  Denies bruising or any bleeding such as epistaxis, hematuria, vaginal or rectal bleeding.  Denies recent fever, chills, cough, chest pain, dyspnea.  She is vaccinated and boosted against COVID, plans to get flu shot next month.  She has ongoing dental work.    MEDICAL HISTORY:  Past Medical History:  Diagnosis Date   BMI 40.0-44.9, adult ([place])    CAD in native artery, stent to RCA BMS with MI 2015 11/04/2003   a. inferior STEMI 01/2014 s/p BMS to prox RCA.   DVT (deep venous thrombosis) ([place]) 2003   lower extremity DVT with questionable recurrence in 2005. Another admission 2015.   Endometrial cancer ([place])    Endometrial thickening on ultra sound 07/2009   path showing Fragments of benign endocervical and squamous mucosa. No dysplasia or malignancy // Followed by Dr. [name]   First degree AV block    Gastric ulcer    GERD (gastroesophageal reflux disease)  GI bleed    Hemorrhoid    Hyperlipidemia LDL goal <70    Hypertension    Insomnia 11/02/2010   Memory difficulties 11/02/2010   Mild dilation of ascending aorta (HCC)    Morbid obesity ([place]) 09/16/2012   Obstructive sleep apnea 09/23/2010   Pre-diabetes    Pulmonary nodule    ST elevation myocardial  infarction (STEMI) of inferior wall ([place]) 01/24/2014   Stroke ([place]) 2015   Syncope 12/18/2013   Syncope, near 12/14/2018   TIA (transient ischemic attack) 09/23/2010   09/13/2010: CT, MRI/MRA no acute process. Carotid doppler: no stenosis. 2D-Echo Normal LV function with an EF 55-60%.    Trifascicular block    UTI (urinary tract infection) 01/27/2014    SURGICAL HISTORY: Past Surgical History:  Procedure Laterality Date   COLONOSCOPY W/ POLYPECTOMY  11/01/2007   8 mm rectal adenoma, hemorrhoids   CORONARY ANGIOPLASTY WITH STENT PLACEMENT  01/24/14   Inf. STEMI, BMS to RCA   [place] N/A 03/28/2019   Procedure: DILATATION AND CURETTAGE OF UTERUS;  Surgeon: Lafonda Mosses, MD;  Location: [facility];  Service: Gynecology;  Laterality: N/A;   ESOPHAGOGASTRODUODENOSCOPY N/A 10/06/2018   Procedure: ESOPHAGOGASTRODUODENOSCOPY (EGD);  Surgeon: Doran Stabler, MD;  Location: [facility];  Service: Gastroenterology;  Laterality: N/A;   HOT HEMOSTASIS N/A 10/06/2018   Procedure: HOT HEMOSTASIS (ARGON PLASMA COAGULATION/BICAP);  Surgeon: Doran Stabler, MD;  Location: [facility];  Service: Gastroenterology;  Laterality: N/A;   INTRAUTERINE DEVICE (IUD) INSERTION N/A 03/28/2019   Procedure: INTRAUTERINE DEVICE (IUD) INSERTION;  Surgeon: Lafonda Mosses, MD;  Location: [facility];  Service: Gynecology;  Laterality: N/A;   KNEE SURGERY  1996   Left knee arthroscopy.   LEFT HEART CATHETERIZATION WITH CORONARY ANGIOGRAM N/A 01/24/2014   Procedure: LEFT HEART CATHETERIZATION WITH CORONARY ANGIOGRAM;  Surgeon: Burnell Blanks, MD;  Location: [facility] CATH LAB;  Service: Cardiovascular;  Laterality: N/A;   ROBOTIC ASSISTED TOTAL HYSTERECTOMY WITH BILATERAL SALPINGO OOPHERECTOMY N/A 09/11/2019   Procedure: XI ROBOTIC ASSISTED TOTAL HYSTERECTOMY WITH BILATERAL SALPINGO OOPHORECTOMY, LYMPH NODE DISSECTION;  Surgeon: Lafonda Mosses, MD;  Location: [facility];  Service: Gynecology;   Laterality: N/A;   ROTATOR CUFF REPAIR     right.   SCLEROTHERAPY  10/06/2018   Procedure: SCLEROTHERAPY;  Surgeon: Doran Stabler, MD;  [facility];  Service: Gastroenterology;;   SENTINEL NODE BIOPSY N/A 09/11/2019   Procedure: SENTINEL NODE BIOPSY;  Surgeon: Lafonda Mosses, MD;  Location: [facility];  Service: Gynecology;  Laterality: N/A;   TUBAL LIGATION      I have reviewed the social history and family history with the patient and they are unchanged from previous note.  ALLERGIES:  is allergic to iohexol and hydrochlorothiazide.  MEDICATIONS:  Current Outpatient Medications  Medication Sig Dispense Refill   acetaminophen (TYLENOL) 500 MG tablet Take 500 mg by mouth 2 (two) times daily as needed (for pain).     aspirin EC 81 MG tablet Swallow whole. Take one tablet 3 times week. 90 tablet 3   atorvastatin (LIPITOR) 80 MG tablet TAKE 1 TABLET BY MOUTH EVERY DAY 90 tablet 3   busPIRone (BUSPAR) 7.5 MG tablet Take 7.5 mg by mouth 2 (two) times daily.     cholecalciferol (VITAMIN D3) 25 MCG (1000 UNIT) tablet Take 1,000 Units by mouth daily.     Ferrous Gluconate 239 (27 Fe) MG TABS Take by mouth.     furosemide (LASIX)  20 MG tablet TAKE 40 MG (2 TABLETS) A DAY FOR 2 WEEKS AND THEN BACK TO 20 MG (1 TABLET) DAILY BY MOUTH 90 tablet 3   lisinopril (ZESTRIL) 40 MG tablet Take 40 mg by mouth daily.     metoprolol succinate (TOPROL XL) 25 MG 24 hr tablet Take 1 tablet (25 mg total) by mouth in the morning and at bedtime. 180 tablet 3   Multiple Vitamin (MULTIVITAMIN WITH MINERALS) TABS Take 1 tablet by mouth every morning.     Naphazoline HCl (CLEAR EYES OP) Place 1 drop into both eyes as needed (for irritation or dryness).      nitroGLYCERIN (NITROSTAT) 0.4 MG SL tablet Place 1 tablet (0.4 mg total) under the tongue every 5 (five) minutes x 3 doses as needed for chest pain. 25 tablet 3   omeprazole (PRILOSEC) 20 MG capsule Take 1 capsule (20 mg total) by mouth every other day.  45 capsule 3   potassium chloride (KLOR-CON) 10 MEQ tablet TAKE 20 MG (2 TABLETS) A DAY FOR 2 WEEKS AND THEN BACK TO 10 MG (1 TABLET) DAILY BY MOUTH 90 tablet 3   sodium chloride (OCEAN) 0.65 % SOLN nasal spray Place 1 spray into both nostrils as needed for congestion.     apixaban (ELIQUIS) 2.5 MG TABS tablet Take 1 tablet (2.5 mg total) by mouth 2 (two) times daily. 60 tablet 5   ibuprofen (ADVIL) 200 MG tablet Take 200 mg by mouth every 6 (six) hours as needed for headache or moderate pain.     No current facility-administered medications for this visit.    PHYSICAL EXAMINATION: ECOG PERFORMANCE STATUS: 0 - Asymptomatic  Vitals:   12/16/20 1015  BP: (!) 163/67  Pulse: 65  Resp: 19  Temp: 98.1 F (36.7 C)  SpO2: 97%   Filed Weights   12/16/20 1015  Weight: 251 lb 11.2 oz (114.2 kg)    GENERAL:alert, no distress and comfortable SKIN: No rash EYES: sclera clear LUNGS: clear with normal breathing effort HEART: regular rate & rhythm, moderate lower leg edema  L>R with venous stasis changes.  2 small blisters to left anterior lower leg ABDOMEN:abdomen soft, non-tender and normal bowel sounds NEURO: alert & oriented x 3 with fluent speech, no focal motor/sensory deficits  LABORATORY DATA:  I have reviewed the data as listed CBC Latest Ref Rng & Units 12/16/2020 12/06/2019 10/17/2019  WBC 4.0 - 10.5 K/uL 8.7 8.5 12.2(H)  Hemoglobin 12.0 - 15.0 g/dL 14.1 12.8 13.3  Hematocrit 36.0 - 46.0 % 43.5 40.0 41.2  Platelets 150 - 400 K/uL 227 206 267     CMP Latest Ref Rng & Units 12/16/2020 07/09/2020 12/06/2019  Glucose 70 - 99 mg/dL 166(H) 116(H) 162(H)  BUN 8 - 23 mg/dL '10 12 12  ' Creatinine 0.44 - 1.00 mg/dL 0.72 0.65 0.69  Sodium 135 - 145 mmol/L 140 137 140  Potassium 3.5 - 5.1 mmol/L 4.5 4.2 4.0  Chloride 98 - 111 mmol/L 104 101 107  CO2 22 - 32 mmol/L '27 21 27  ' Calcium 8.9 - 10.3 mg/dL 9.2 9.2 9.8  Total Protein 6.5 - 8.1 g/dL 6.4(L) - 6.4(L)  Total Bilirubin 0.3 - 1.2 mg/dL 0.7  - 0.7  Alkaline Phos 38 - 126 U/L 130(H) - 130(H)  AST 15 - 41 U/L 15 - 14(L)  ALT 0 - 44 U/L 20 - 18      RADIOGRAPHIC STUDIES: I have personally reviewed the radiological images as listed and agreed with the  findings in the report. No results found.   ASSESSMENT & PLAN: 80 year old female  1. Recurrent DVT and GI bleed on [facility] -She had recurrent DVT while on anticoagulation, previously on Xarelto, and most recently developed large upper GI bleed while on Coumadin and aspirin 81 mg.  -Her bleeding ulcer resolved, started prophylactic dose of Eliquis 2.5 mg BID 12/2018 -Tolerating well but has been taking 2.5 mg daily rather than twice daily for past 6 months -No signs of recurrent thrombosis or bleeding -Continue AC indefinitely  2. HTN CAD, history of recent MI and stroke  -On multidrug regimen, followed by PCP Dr. Ashby Dawes and cardiology Dr. [name]  3. Obesity, sedentary lifestyle, knee pain -Ambulates with a cane -Stable  4.  Endometrial endometrioid adenocarcinoma, FIGO grade 1, pT1aN0 M0 -She underwent D&C and Mirena IUD for progesterone therapy due to abnormal vaginal bleeding.  Path showed endometrioid carcinoma, FIGO grade 1-2, confirmed on biopsy 07/02/2019 -S/p total hysterectomy with BSO 09/11/2019 by Dr. Gaylyn Rong and our GYN oncology clinic -Due to low-grade favorable histology and very early stage disease, there was no benefit of adjuvant chemotherapy was not recommended -Continue surveillance with Dr. Berline Lopes  Disposition: [name] appears stable.  No signs of recurrent bleeding or thrombosis.  Labs reviewed.  She understands and agrees with the recommendation to continue [facility] indefinitely, she will increase to prophylactic dose Eliquis 2.5 mg twice daily.  She is on low-dose aspirin few times a week per cardiology.  She knows to call if she has bleeding, bruising, or signs of recurrent thrombosis.  Continue follow-up with medical team.  Return for  lab and follow-up in 1 year, or sooner if needed.  All questions were answered. The patient knows to call the clinic with any problems, questions or concerns. No barriers to learning were detected.     Alla Feeling, NP 12/16/20

## 2020-12-17 ENCOUNTER — Telehealth: Payer: Self-pay | Admitting: Hematology

## 2020-12-17 NOTE — Telephone Encounter (Signed)
Scheduled follow-up appointment per 9/13 los. Patient's daughter is aware.

## 2020-12-19 DIAGNOSIS — G4733 Obstructive sleep apnea (adult) (pediatric): Secondary | ICD-10-CM

## 2020-12-19 NOTE — Telephone Encounter (Signed)
I am covering Shirley [name]'s inbox today and will route this message to [place].

## 2020-12-30 DIAGNOSIS — E1151 Type 2 diabetes mellitus with diabetic peripheral angiopathy without gangrene: Secondary | ICD-10-CM | POA: Diagnosis not present

## 2020-12-30 DIAGNOSIS — L84 Corns and callosities: Secondary | ICD-10-CM | POA: Diagnosis not present

## 2021-01-07 ENCOUNTER — Other Ambulatory Visit (HOSPITAL_BASED_OUTPATIENT_CLINIC_OR_DEPARTMENT_OTHER): Payer: Self-pay

## 2021-01-07 ENCOUNTER — Ambulatory Visit: Payer: Medicare Other | Attending: Internal Medicine

## 2021-01-07 DIAGNOSIS — Z23 Encounter for immunization: Secondary | ICD-10-CM

## 2021-01-07 MED ORDER — PFIZER COVID-19 VAC BIVALENT 30 MCG/0.3ML IM SUSP
INTRAMUSCULAR | 0 refills | Status: DC
  Filled 2021-01-07: qty 0.3, 1d supply, fill #0

## 2021-01-07 NOTE — Progress Notes (Signed)
   Covid-19 Vaccination Clinic  Name:  Sephira Zellman    MRN: [id number] DOB: 27-Nov-1940  01/07/2021  Ms. [name] was observed post Covid-19 immunization for 15 minutes without incident. She was provided with Vaccine Information Sheet and instruction to access the V-Safe system.   Ms. [name] was instructed to call 911 with any severe reactions post vaccine: Difficulty breathing  Swelling of face and throat  A fast heartbeat  A bad rash all over body  Dizziness and weakness

## 2021-01-13 DIAGNOSIS — I10 Essential (primary) hypertension: Secondary | ICD-10-CM | POA: Diagnosis not present

## 2021-01-13 DIAGNOSIS — R600 Localized edema: Secondary | ICD-10-CM | POA: Diagnosis not present

## 2021-01-13 DIAGNOSIS — D6869 Other thrombophilia: Secondary | ICD-10-CM | POA: Diagnosis not present

## 2021-01-13 DIAGNOSIS — I700 Atherosclerosis of aorta: Secondary | ICD-10-CM | POA: Diagnosis not present

## 2021-01-13 DIAGNOSIS — E1165 Type 2 diabetes mellitus with hyperglycemia: Secondary | ICD-10-CM | POA: Diagnosis not present

## 2021-01-13 DIAGNOSIS — E782 Mixed hyperlipidemia: Secondary | ICD-10-CM | POA: Diagnosis not present

## 2021-01-13 DIAGNOSIS — E1142 Type 2 diabetes mellitus with diabetic polyneuropathy: Secondary | ICD-10-CM | POA: Diagnosis not present

## 2021-01-13 DIAGNOSIS — N182 Chronic kidney disease, stage 2 (mild): Secondary | ICD-10-CM | POA: Diagnosis not present

## 2021-01-13 DIAGNOSIS — D692 Other nonthrombocytopenic purpura: Secondary | ICD-10-CM | POA: Diagnosis not present

## 2021-01-13 DIAGNOSIS — R1319 Other dysphagia: Secondary | ICD-10-CM | POA: Diagnosis not present

## 2021-01-16 DIAGNOSIS — Z23 Encounter for immunization: Secondary | ICD-10-CM | POA: Diagnosis not present

## 2021-01-16 NOTE — Addendum Note (Signed)
Addended by: Freada Bergeron on: 01/16/2021 04:10 PM   Modules accepted: Orders

## 2021-01-22 DIAGNOSIS — I739 Peripheral vascular disease, unspecified: Secondary | ICD-10-CM | POA: Diagnosis not present

## 2021-02-04 DIAGNOSIS — Z1231 Encounter for screening mammogram for malignant neoplasm of breast: Secondary | ICD-10-CM | POA: Diagnosis not present

## 2021-02-10 DIAGNOSIS — M67874 Other specified disorders of tendon, left ankle and foot: Secondary | ICD-10-CM | POA: Diagnosis not present

## 2021-02-10 DIAGNOSIS — D6869 Other thrombophilia: Secondary | ICD-10-CM | POA: Diagnosis not present

## 2021-02-10 DIAGNOSIS — I700 Atherosclerosis of aorta: Secondary | ICD-10-CM | POA: Diagnosis not present

## 2021-02-10 DIAGNOSIS — R600 Localized edema: Secondary | ICD-10-CM | POA: Diagnosis not present

## 2021-02-10 DIAGNOSIS — N182 Chronic kidney disease, stage 2 (mild): Secondary | ICD-10-CM | POA: Diagnosis not present

## 2021-02-10 DIAGNOSIS — E782 Mixed hyperlipidemia: Secondary | ICD-10-CM | POA: Diagnosis not present

## 2021-02-10 DIAGNOSIS — E1142 Type 2 diabetes mellitus with diabetic polyneuropathy: Secondary | ICD-10-CM | POA: Diagnosis not present

## 2021-02-10 DIAGNOSIS — I10 Essential (primary) hypertension: Secondary | ICD-10-CM | POA: Diagnosis not present

## 2021-02-10 DIAGNOSIS — M79675 Pain in left toe(s): Secondary | ICD-10-CM | POA: Diagnosis not present

## 2021-02-10 DIAGNOSIS — D692 Other nonthrombocytopenic purpura: Secondary | ICD-10-CM | POA: Diagnosis not present

## 2021-02-10 DIAGNOSIS — E1151 Type 2 diabetes mellitus with diabetic peripheral angiopathy without gangrene: Secondary | ICD-10-CM | POA: Diagnosis not present

## 2021-02-17 ENCOUNTER — Ambulatory Visit: Payer: Medicare Other | Admitting: Cardiology

## 2021-03-09 ENCOUNTER — Encounter: Payer: Self-pay | Admitting: Cardiovascular Disease

## 2021-03-09 ENCOUNTER — Telehealth: Payer: Self-pay | Admitting: Cardiovascular Disease

## 2021-03-09 NOTE — Telephone Encounter (Signed)
This was addressed by Dr. [name] in a MyChart message today.

## 2021-03-09 NOTE — Telephone Encounter (Signed)
Patient's daughter wanted to know if she is safe to travel by Airplane to [place]. This will be the first time she has flown since her heart attack.  She will be flying Dec 8th to [place] and will be returning Dec 10th   Please advise

## 2021-03-11 NOTE — Progress Notes (Signed)
Cardiology Office Note    Date:  03/18/2021   ID:  Shirley [name], [place] 09-03-1940, MRN [id number]   PCP:  Merrilee Seashore, [place]  Cardiologist:  Lauree Chandler, MD   Advanced Practice Provider:  No care team member to display Electrophysiologist:  None   [id number]}   Chief Complaint  Patient presents with  . Follow-up    History of Present Illness:  Shirley [name] is a 80 y.o. female with history of CAD with inferior STEMI October 2015 treated with BMS to the RCA, no disease in the LAD or circumflex, low risk NST 09/27/2017 HTN, recurrent DVT on , prior CVA, prior GI bleeding with gastric ulcer 10/2018 and sleep apnea.   I saw the patient 07/09/2020 with fluid overload and was drinking hydration packets and Pedialyte daily.  Lasix and potassium were increased.  Echo showed normal LVEF 60 to 65% with grade 2 DD.  BNP was 64.  Follow-up 08/13/2020 weight was up 5 pounds but said her clothes were fitting looser.  She claims her home scale went from 258 pounds down to 241 pounds.  I did not make any changes that day.   I saw the patient 12/10/20 and blood pressures were running high.  She also had some heart racing.  Chronic knee pain.  Was having trouble with her CPAP mask.  Was placed on anxiety medications because of the controlling husband.  I increased Toprol XL and had I offered counseling but she declined.  Patient comes in for f/u. BP much better controlled. Leg swelling up and down. Worse when she sits and sews.  Walks some. Weight up 22 lbs since May and 12 lbs since Sept. She says she's overeating.  Blood pressure was up when she saw her PCP and he added telmisartan and told her to stop lisinopril.  She was afraid to do this because of the dose difference and stayed on lisinopril.  She is also on amlodipine 10 mg daily and not sure when this was added.  Blood pressure is controlled today.    Past Medical History:  Diagnosis  Date  . BMI 40.0-44.9, adult ([place])   . CAD in native artery, stent to RCA BMS with MI 2015 11/04/2003   a. inferior STEMI 01/2014 s/p BMS to prox RCA.  [name] DVT (deep venous thrombosis) ([place]) 2003   lower extremity DVT with questionable recurrence in 2005. Another admission 2015.  . Endometrial cancer ([place])   . Endometrial thickening on ultra sound 07/2009   path showing Fragments of benign endocervical and squamous mucosa. No dysplasia or malignancy // Followed by Dr. [name]  . First degree AV block   . Gastric ulcer   . GERD (gastroesophageal reflux disease)   . GI bleed   . Hemorrhoid   . Hyperlipidemia LDL goal <70   . Hypertension   . Insomnia 11/02/2010  . Memory difficulties 11/02/2010  . Mild dilation of ascending aorta ([facility])   . Morbid obesity ([place]) 09/16/2012  . Obstructive sleep apnea 09/23/2010  . Pre-diabetes   . Pulmonary nodule   . ST elevation myocardial infarction (STEMI) of inferior wall ([place]) 01/24/2014  . Stroke ([place]) 2015  . Syncope 12/18/2013  . Syncope, near 12/14/2018  . TIA (transient ischemic attack) 09/23/2010   09/13/2010: CT, MRI/MRA no acute process. Carotid doppler: no stenosis. 2D-Echo Normal LV function with an EF 55-60%.   . Trifascicular block   .  UTI (urinary tract infection) 01/27/2014    Past Surgical History:  Procedure Laterality Date  . COLONOSCOPY W/ POLYPECTOMY  11/01/2007   8 mm rectal adenoma, hemorrhoids  . CORONARY ANGIOPLASTY WITH STENT PLACEMENT  01/24/14   Inf. STEMI, BMS to RCA  . DILATION AND CURETTAGE OF UTERUS N/A 03/28/2019   Procedure: DILATATION AND CURETTAGE OF UTERUS;  Surgeon: Lafonda Mosses, MD;  Location: [facility];  Service: Gynecology;  Laterality: N/A;  . ESOPHAGOGASTRODUODENOSCOPY N/A 10/06/2018   Procedure: ESOPHAGOGASTRODUODENOSCOPY (EGD);  Surgeon: Doran Stabler, MD;  Location: [facility];  Service: Gastroenterology;  Laterality: N/A;  . HOT HEMOSTASIS N/A 10/06/2018   Procedure: HOT HEMOSTASIS (ARGON  PLASMA COAGULATION/BICAP);  Surgeon: Doran Stabler, MD;  Location: [facility];  Service: Gastroenterology;  Laterality: N/A;  . INTRAUTERINE DEVICE (IUD) INSERTION N/A 03/28/2019   Procedure: INTRAUTERINE DEVICE (IUD) INSERTION;  Surgeon: Lafonda Mosses, MD;  Location: [facility];  Service: Gynecology;  Laterality: N/A;  . KNEE SURGERY  1996   Left knee arthroscopy.  [name] LEFT HEART CATHETERIZATION WITH CORONARY ANGIOGRAM N/A 01/24/2014   Procedure: LEFT HEART CATHETERIZATION WITH CORONARY ANGIOGRAM;  Surgeon: Burnell Blanks, MD;  Location: [facility] CATH LAB;  Service: Cardiovascular;  Laterality: N/A;  . ROBOTIC ASSISTED TOTAL HYSTERECTOMY WITH BILATERAL SALPINGO OOPHERECTOMY N/A 09/11/2019   Procedure: XI ROBOTIC ASSISTED TOTAL HYSTERECTOMY WITH BILATERAL SALPINGO OOPHORECTOMY, LYMPH NODE DISSECTION;  Surgeon: Lafonda Mosses, MD;  Location: [facility];  Service: Gynecology;  Laterality: N/A;  . ROTATOR CUFF REPAIR     right.  [name]  10/06/2018   Procedure: [name];  Surgeon: Doran Stabler, MD;  Location: [facility] ENDOSCOPY;  Service: Gastroenterology;;  . [name] NODE BIOPSY N/A 09/11/2019   Procedure: SENTINEL NODE BIOPSY;  Surgeon: Lafonda Mosses, MD;  Location: [facility];  Service: Gynecology;  Laterality: N/A;  . TUBAL LIGATION      Current Medications: Current Meds  Medication Sig  . acetaminophen (TYLENOL) 500 MG tablet Take 500 mg by mouth 2 (two) times daily as needed (for pain).  [name] amLODipine (NORVASC) 10 MG tablet Take 10 mg by mouth daily.  [name] apixaban (ELIQUIS) 2.5 MG TABS tablet Take 1 tablet (2.5 mg total) by mouth 2 (two) times daily.  [name] aspirin EC 81 MG tablet Swallow whole. Take one tablet 3 times week.  [name] atorvastatin (LIPITOR) 80 MG tablet TAKE 1 TABLET BY MOUTH EVERY DAY  . busPIRone (BUSPAR) 7.5 MG tablet Take 7.5 mg by mouth 2 (two) times daily.  . cholecalciferol (VITAMIN D3) 25 MCG (1000 UNIT) tablet Take 1,000 Units by mouth daily.  . Ferrous  Gluconate 239 (27 Fe) MG TABS Take by mouth.  . furosemide (LASIX) 20 MG tablet TAKE 40 MG (2 TABLETS) A DAY FOR 2 WEEKS AND THEN BACK TO 20 MG (1 TABLET) DAILY BY MOUTH  . ibuprofen (ADVIL) 200 MG tablet Take 200 mg by mouth every 6 (six) hours as needed for headache or moderate pain.  [name] lisinopril (ZESTRIL) 40 MG tablet Take 40 mg by mouth daily.  . metoprolol succinate (TOPROL XL) 25 MG 24 hr tablet Take 1 tablet (25 mg total) by mouth in the morning and at bedtime.  . Multiple Vitamin (MULTIVITAMIN WITH MINERALS) TABS Take 1 tablet by mouth every morning.  . Naphazoline HCl (CLEAR EYES OP) Place 1 drop into both eyes as needed (for irritation or dryness).   . nitroGLYCERIN (NITROSTAT) 0.4 MG SL tablet Place 1 tablet (0.4 mg  total) under the tongue every 5 (five) minutes x 3 doses as needed for chest pain.  [name] omeprazole (PRILOSEC) 20 MG capsule Take 1 capsule (20 mg total) by mouth every other day.  . potassium chloride (KLOR-CON) 10 MEQ tablet TAKE 20 MG (2 TABLETS) A DAY FOR 2 WEEKS AND THEN BACK TO 10 MG (1 TABLET) DAILY BY MOUTH  . sodium chloride (OCEAN) 0.65 % SOLN nasal spray Place 1 spray into both nostrils as needed for congestion.  [name] telmisartan (MICARDIS) 80 MG tablet Take 80 mg by mouth daily.  . [DISCONTINUED] COVID-19 mRNA bivalent vaccine, Pfizer, (PFIZER COVID-19 VAC BIVALENT) injection Inject into the muscle.     Allergies:   Iohexol and Hydrochlorothiazide   Social History   Socioeconomic History  . Marital status: Married    Spouse name: Not on file  . Number of children: 6  . Years of education: College  . Highest education level: Not on file  Occupational History  . Occupation: Control and instrumentation engineer  . Occupation: TEACHER- [employer]    Employer: [employer]  Tobacco Use  . Smoking status: Never  . Smokeless tobacco: Never  Vaping Use  . Vaping Use: Never used  Substance and Sexual Activity  . Alcohol use: No  . Drug use: No  . Sexual  activity: Not on file  Other Topics Concern  . Not on file  Social History Narrative   Full Code status.   Insurance: BCBS   Social Determinants of Health   Financial Resource Strain: Not on file  Food Insecurity: Not on file  Transportation Needs: Not on file  Physical Activity: Not on file  Stress: Not on file  Social Connections: Not on file     Family History:  The patient's  family history includes Alzheimer's disease in her maternal grandmother; Angina in her father; Dementia in her mother; Diabetes in her mother; Heart attack in her father; Heart disease in her brother and mother; Hyperlipidemia in her mother; Liver cancer in her sister; Thyroid cancer in her sister.   ROS:   Please see the history of present illness.    ROS All other systems reviewed and are negative.   PHYSICAL EXAM:   VS:  BP 120/68   Pulse 64   Ht 5\' 3"  (1.6 m)   Wt 263 lb (119.3 kg)   SpO2 99%   BMI 46.59 kg/m   Physical Exam  GEN: Obese, in no acute distress  Neck: no JVD, carotid bruits, or masses Cardiac:RRR; no murmurs, rubs, or gallops  Respiratory:  clear to auscultation bilaterally, normal work of breathing GI: soft, nontender, nondistended, + BS Ext: Significant edema.  She does have compression stockings on without cyanosis, clubbing, or edema, Good distal pulses bilaterally Neuro:  Alert and Oriented x 3, Strength and sensation are intact Psych: euthymic mood, full affect  Wt Readings from Last 3 Encounters:  03/18/21 263 lb (119.3 kg)  12/16/20 251 lb 11.2 oz (114.2 kg)  12/10/20 251 lb 12.8 oz (114.2 kg)      Studies/Labs Reviewed:   EKG:  EKG is not ordered today.   Recent Labs: 07/09/2020: NT-Pro BNP 64 12/16/2020: ALT 20; BUN 10; Creatinine 0.72; Hemoglobin 14.1; Platelet Count 227; Potassium 4.5; Sodium 140   Lipid Panel    Component Value Date/Time   CHOL 129 10/17/2019 0914   TRIG 50 10/17/2019 0914   HDL 47 10/17/2019 0914   CHOLHDL 2.7 10/17/2019 0914    CHOLHDL 4 03/19/2014 0745  VLDL 22.0 03/19/2014 0745   LDLCALC 71 10/17/2019 0914    Additional studies/ records that were reviewed today include:   Echo 08/07/2020: IMPRESSIONS     1. Left ventricular ejection fraction, by estimation, is 60 to 65%. The  left ventricle has normal function. The left ventricle has no regional  wall motion abnormalities. Left ventricular diastolic parameters are  consistent with Grade II diastolic  dysfunction (pseudonormalization).   2. Right ventricular systolic function is normal. The right ventricular  size is normal.   3. The mitral valve is normal in structure. No evidence of mitral valve  regurgitation. No evidence of mitral stenosis.   4. The aortic valve is normal in structure. Aortic valve regurgitation is  not visualized. No aortic stenosis is present.   5. There is mild dilatation of the ascending aorta, measuring 43 mm.   6. The inferior vena cava is normal in size with greater than 50%  respiratory variability, suggesting right atrial pressure of 3 mmHg.   Comparison(s): Prior images unable to be directly viewed, comparison made  by report only.      Echo 01/24/14:  Left ventricle: The cavity size was normal. There was mild   concentric hypertrophy. Systolic function was normal. The   estimated ejection fraction was in the range of 55% to 60%. Wall   motion was normal; there were no regional wall motion   abnormalities. - Aortic valve: There was trivial regurgitation. - Ascending aorta: The ascending aorta was mildly dilated. - Mitral valve: Calcified annulus.   Cardiac cath 01/24/14: Left main: No obstructive disease.   Left Anterior Descending Artery: Large caliber vessel that courses to the apex. Moderate caliber diagonal branch. No obstructive disease.   Circumflex Artery: Large caliber vessel with large obtuse marginal branch. No obstructive disease.   Right Coronary Artery: Moderate caliber co-dominant vessel with  sub-totally occluded proximal vessel.  Left Ventricular Angiogram: LVEF=50%      Risk Assessment/Calculations:         ASSESSMENT:    1. Palpitations   2. Lower extremity edema   3. CAD in native artery   4. Primary hypertension   5. History of DVT of lower extremity   6. History of CVA (cerebrovascular accident)   7. History of GI bleed   8. Obstructive sleep apnea   9. Morbid obesity ([place])      PLAN:  In order of problems listed above:  Palpitations improved since I increased Toprol XL 25 mg bid.    LE edema-still has swelling and 22 lb weight gain since May. Will increase lasix 40mg  daily for a week K 20 meq daily for a week. 2 gm sodium diet   CAD status post inferior STEMI 01/2014 treated with BMS to the proximal RCA, no disease in the LAD or circumflex, low risk NST 09/2017-no angina   Hypertension BP improved on increased Toprol.  PCP tried to prescribe telmisartan but patient stayed on lisinopril.  Blood pressure actually was stable here today.  Will not make any changes today  Recurrent DVTs on Eliquis   Prior CVA on ASA 3 times a week with eliquis   History of GI bleed 10/2018-no recent problems   OSA using her CPAP but mask doesn't fit Gae Bon was checking on this.  Obesity patient has gained over 20 pounds since May.  Long discussion about importance of exercise and weight loss.  Patient says she is eating at random times and too much.  She said she will  try harder.  Shared Decision Making/Informed Consent        Medication Adjustments/Labs and Tests Ordered: Current medicines are reviewed at length with the patient today.  Concerns regarding medicines are outlined above.  Medication changes, Labs and Tests ordered today are listed in the Patient Instructions below. Patient Instructions  Medication Instructions:  1.Increase furosemide (Lasix) to 40 mg (2 tablets) by mouth daily for 1 week then resume 20 mg (1 tablet) by mouth daily 2.Increase potassium to  20 meq (2 tablets) by mouth daily for 1 week then resume 10 meq (1 tablet) by mouth daily *If you need a refill on your cardiac medications before your next appointment, please call your pharmacy*   Lab Work: None If you have labs (blood work) drawn today and your tests are completely normal, you will receive your results only by: [place] (if you have MyChart) OR A paper copy in the mail If you have any lab test that is abnormal or we need to change your treatment, we will call you to review the results.   Follow-Up: At [facility], you and your health needs are our priority.  As part of our continuing mission to provide you with exceptional heart care, we have created designated Provider Care Teams.  These Care Teams include your primary Cardiologist (physician) and Advanced Practice Providers (APPs -  Physician Assistants and Nurse Practitioners) who all work together to provide you with the care you need, when you need it.   Your next appointment:   4 month(s), 07/20/21 at 3:40 PM  The format for your next appointment:   In Person  Provider:   Lauree Chandler, MD     Other Instructions 1.Your provider recommends that you maintain 150 minutes per week of moderate aerobic activity.  2.Your provider encouraged you to lose weight for better health.    Two Gram Sodium Diet 2000 mg  What is Sodium? Sodium is a mineral found naturally in many foods. The most significant source of sodium in the diet is table salt, which is about 40% sodium.  Processed, convenience, and preserved foods also contain a large amount of sodium.  The body needs only 500 mg of sodium daily to function,  A normal diet provides more than enough sodium even if you do not use salt.  Why Limit Sodium? A build up of sodium in the body can cause thirst, increased blood pressure, shortness of breath, and water retention.  Decreasing sodium in the diet can reduce edema and risk of heart attack or stroke  associated with high blood pressure.  Keep in mind that there are many other factors involved in these health problems.  Heredity, obesity, lack of exercise, cigarette smoking, stress and what you eat all play a role.  General Guidelines: Do not add salt at the table or in cooking.  One teaspoon of salt contains over 2 grams of sodium. Read food labels Avoid processed and convenience foods Ask your dietitian before eating any foods not dicussed in the menu planning guidelines Consult your physician if you wish to use a salt substitute or a sodium containing medication such as antacids.  Limit milk and milk products to 16 oz (2 cups) per day.  Shopping Hints: READ LABELS!! "Dietetic" does not necessarily mean low sodium. Salt and other sodium ingredients are often added to foods during processing.    Menu Planning Guidelines Food Group Choose More Often Avoid  Beverages (see also the milk group All fruit juices, low-sodium,  salt-free vegetables juices, low-sodium carbonated beverages Regular vegetable or tomato juices, commercially softened water used for drinking or cooking  Breads and Cereals Enriched white, wheat, rye and pumpernickel bread, hard rolls and dinner rolls; muffins, cornbread and waffles; most dry cereals, cooked cereal without added salt; unsalted crackers and breadsticks; low sodium or homemade bread crumbs Bread, rolls and crackers with salted tops; quick breads; instant hot cereals; pancakes; commercial bread stuffing; self-rising flower and biscuit mixes; regular bread crumbs or cracker crumbs  Desserts and Sweets Desserts and sweets mad with mild should be within allowance Instant pudding mixes and cake mixes  Fats Butter or margarine; vegetable oils; unsalted salad dressings, regular salad dressings limited to 1 Tbs; light, sour and heavy cream Regular salad dressings containing bacon fat, bacon bits, and salt pork; snack dips made with instant soup mixes or processed  cheese; salted nuts  Fruits Most fresh, frozen and canned fruits Fruits processed with salt or sodium-containing ingredient (some dried fruits are processed with sodium sulfites        Vegetables Fresh, frozen vegetables and low- sodium canned vegetables Regular canned vegetables, sauerkraut, pickled vegetables, and others prepared in brine; frozen vegetables in sauces; vegetables seasoned with ham, bacon or salt pork  Condiments, Sauces, Miscellaneous  Salt substitute with physician's approval; pepper, herbs, spices; vinegar, lemon or lime juice; hot pepper sauce; garlic powder, onion powder, low sodium soy sauce (1 Tbs.); low sodium condiments (ketchup, chili sauce, mustard) in limited amounts (1 tsp.) fresh ground horseradish; unsalted tortilla chips, pretzels, potato chips, popcorn, salsa (1/4 cup) Any seasoning made with salt including garlic salt, celery salt, onion salt, and seasoned salt; sea salt, rock salt, kosher salt; meat tenderizers; monosodium glutamate; mustard, regular soy sauce, barbecue, sauce, chili sauce, teriyaki sauce, steak sauce, Worcestershire sauce, and most flavored vinegars; canned gravy and mixes; regular condiments; salted snack foods, olives, picles, relish, horseradish sauce, catsup   Food preparation: Try these seasonings Meats:    Pork Sage, onion Serve with applesauce  Chicken Poultry seasoning, thyme, parsley Serve with cranberry sauce  Lamb Curry powder, rosemary, garlic, thyme Serve with mint sauce or jelly  Veal Marjoram, basil Serve with current jelly, cranberry sauce  Beef Pepper, bay leaf Serve with dry mustard, unsalted chive butter  Fish Bay leaf, dill Serve with unsalted lemon butter, unsalted parsley butter  Vegetables:    Asparagus Lemon juice   Broccoli Lemon juice   Carrots Mustard dressing parsley, mint, nutmeg, glazed with unsalted butter and sugar   Green beans Marjoram, lemon juice, nutmeg,dill seed   Tomatoes Basil, marjoram, onion    Spice /blend for [employer]" 4 tsp ground thyme 1 tsp ground sage 3 tsp ground rosemary 4 tsp ground marjoram   Test your knowledge A product that says "Salt Free" may still contain sodium. True or False Garlic Powder and Hot Pepper Sauce an be used as alternative seasonings.True or False Processed foods have more sodium than fresh foods.  True or False Canned Vegetables have less sodium than froze True or False   WAYS TO DECREASE YOUR SODIUM INTAKE Avoid the use of added salt in cooking and at the table.  Table salt (and other prepared seasonings which contain salt) is probably one of the greatest sources of sodium in the diet.  Unsalted foods can gain flavor from the sweet, sour, and butter taste sensations of herbs and spices.  Instead of using salt for seasoning, try the following seasonings with the foods listed.  Remember: how you  use them to enhance natural food flavors is limited only by your creativity... Allspice-Meat, fish, eggs, fruit, peas, red and yellow vegetables Almond Extract-Fruit baked goods Anise Seed-Sweet breads, fruit, carrots, beets, cottage cheese, cookies (tastes like licorice) Basil-Meat, fish, eggs, vegetables, rice, vegetables salads, soups, sauces Bay Leaf-Meat, fish, stews, poultry Burnet-Salad, vegetables (cucumber-like flavor) Caraway Seed-Bread, cookies, cottage cheese, meat, vegetables, cheese, rice Cardamon-Baked goods, fruit, soups Celery Powder or seed-Salads, salad dressings, sauces, meatloaf, soup, bread.Do not use  celery salt Chervil-Meats, salads, fish, eggs, vegetables, cottage cheese (parsley-like flavor) Chili Power-Meatloaf, chicken cheese, corn, eggplant, egg dishes Chives-Salads cottage cheese, egg dishes, soups, vegetables, sauces Cilantro-Salsa, casseroles Cinnamon-Baked goods, fruit, pork, lamb, chicken, carrots Cloves-Fruit, baked goods, fish, pot roast, green beans, beets, carrots Coriander-Pastry, cookies, meat, salads, cheese  (lemon-orange flavor) Cumin-Meatloaf, fish,cheese, eggs, cabbage,fruit pie (caraway flavor) [employer], fruit, eggs, fish, poultry, cottage cheese, vegetables Dill Seed-Meat, cottage cheese, poultry, vegetables, fish, salads, bread Fennel Seed-Bread, cookies, apples, pork, eggs, fish, beets, cabbage, cheese, Licorice-like flavor Garlic-(buds or powder) Salads, meat, poultry, fish, bread, butter, vegetables, potatoes.Do not  use garlic salt Ginger-Fruit, vegetables, baked goods, meat, fish, poultry Horseradish Root-Meet, vegetables, butter Lemon Juice or Extract-Vegetables, fruit, tea, baked goods, fish salads Mace-Baked goods fruit, vegetables, fish, poultry (taste like nutmeg) Maple Extract-Syrups Marjoram-Meat, chicken, fish, vegetables, breads, green salads (taste like Sage) Mint-Tea, lamb, sherbet, vegetables, desserts, carrots, cabbage Mustard, Dry or Seed-Cheese, eggs, meats, vegetables, poultry Nutmeg-Baked goods, fruit, chicken, eggs, vegetables, desserts Onion Powder-Meat, fish, poultry, vegetables, cheese, eggs, bread, rice salads (Do not use   Onion salt) Orange Extract-Desserts, baked goods Oregano-Pasta, eggs, cheese, onions, pork, lamb, fish, chicken, vegetables, green salads Paprika-Meat, fish, poultry, eggs, cheese, vegetables Parsley Flakes-Butter, vegetables, meat fish, poultry, eggs, bread, salads (certain forms may   Contain sodium Pepper-Meat fish, poultry, vegetables, eggs Peppermint Extract-Desserts, baked goods Poppy Seed-Eggs, bread, cheese, fruit dressings, baked goods, noodles, vegetables, cottage  [employer], poultry, meat, fish, cauliflower, turnips,eggs bread Saffron-Rice, bread, veal, chicken, fish, eggs Sage-Meat, fish, poultry, onions, eggplant, tomateos, pork, stews Savory-Eggs, salads, poultry, meat, rice, vegetables, soups, pork Tarragon-Meat, poultry, fish, eggs, butter, vegetables (licorice-like  flavor)  Thyme-Meat, poultry, fish, eggs, vegetables, (clover-like flavor), sauces, soups Tumeric-Salads, butter, eggs, fish, rice, vegetables (saffron-like flavor) Vanilla Extract-Baked goods, candy Vinegar-Salads, vegetables, meat marinades Walnut Extract-baked goods, candy   2. Choose your Foods Wisely   The following is a list of foods to avoid which are high in sodium:  Meats-Avoid all smoked, canned, salt cured, dried and kosher meat and fish as well as Anchovies   [employer] meats:Bologna, Liverwurst, Pastrami Canned meat or fish  Marinated herring Caviar    Pepperoni Corned Beef   Pizza Dried chipped beef  Salami Frozen breaded fish or meat Salt pork Frankfurters or hot dogs  Sardines Gefilte fish   Sausage Ham (boiled ham, Proscuitto Smoked butt    spiced ham)   Spam      TV Dinners Vegetables Canned vegetables (Regular) Relish Canned mushrooms  Sauerkraut Olives    Tomato juice Pickles  Bakery and Dessert Products Canned puddings  Cream pies Cheesecake   Decorated cakes Cookies  Beverages/Juices Tomato juice, regular  Gatorade   V-8 vegetable juice, regular  Breads and Cereals Biscuit mixes   Salted potato chips, corn chips, pretzels Bread stuffing mixes  Salted crackers and rolls Pancake and waffle mixes Self-rising flour  Seasonings Accent    Meat sauces Barbecue sauce  Meat tenderizer Catsup  Monosodium glutamate (MSG) Celery salt   Onion salt Chili sauce   Prepared mustard Garlic salt   Salt, seasoned salt, sea salt Gravy mixes   Soy sauce Horseradish   Steak sauce Ketchup   Tartar sauce Lite salt    Teriyaki sauce Marinade mixes   Worcestershire sauce  Others Baking powder   Cocoa and cocoa mixes Baking soda   Commercial casserole mixes Candy-caramels, chocolate  Dehydrated soups    Bars, fudge,nougats  Instant rice and pasta mixes Canned broth or soup  Maraschino cherries Cheese, aged and processed cheese and cheese  spreads  Learning Assessment Quiz  Indicated T (for True) or F (for False) for each of the following statements:  _____ Fresh fruits and vegetables and unprocessed grains are generally low in sodium _____ Water may contain a considerable amount of sodium, depending on the source _____ You can always tell if a food is high in sodium by tasting it _____ Certain laxatives my be high in sodium and should be avoided unless prescribed   by a physician or pharmacist _____ Salt substitutes may be used freely by anyone on a sodium restricted diet _____ Sodium is present in table salt, food additives and as a natural component of   most foods _____ Table salt is approximately 90% sodium _____ Limiting sodium intake may help prevent excess fluid accumulation in the body _____ On a sodium-restricted diet, seasonings such as bouillon soy sauce, and    cooking wine should be used in place of table salt _____ On an ingredient list, a product which lists monosodium glutamate as the first   ingredient is an appropriate food to include on a low sodium diet  Circle the best answer(s) to the following statements (Hint: there may be more than one correct answer)  11. On a low-sodium diet, some acceptable snack items are:    A. Olives  F. Bean dip   K. Grapefruit juice    B. Salted Pretzels G. Commercial Popcorn   L. Canned peaches    C. Carrot Sticks  H. Bouillon   M. Unsalted nuts   D. [place] fries  I. Peanut butter crackers N. Salami   E. Sweet pickles J. Tomato Juice   O. Pizza  12.  Seasonings that may be used freely on a reduced - sodium diet include   A. Lemon wedges F.Monosodium glutamate K. Celery seed    B.Soysauce   G. Pepper   L. Mustard powder   C. Sea salt  H. Cooking wine  M. Onion flakes   D. Vinegar  E. Prepared horseradish N. Salsa   E. Sage   J. Worcestershire sauce  O. [address]   Sumner Boast, PA-C  03/18/2021 1:50 PM    [facility] [address], [place], [place]  [address] Phone: [phone]; Fax: [phone]

## 2021-03-16 ENCOUNTER — Ambulatory Visit: Payer: Medicare Other | Admitting: Cardiology

## 2021-03-18 ENCOUNTER — Other Ambulatory Visit: Payer: Self-pay

## 2021-03-18 ENCOUNTER — Encounter: Payer: Self-pay | Admitting: Physician Assistant

## 2021-03-18 ENCOUNTER — Ambulatory Visit (INDEPENDENT_AMBULATORY_CARE_PROVIDER_SITE_OTHER): Payer: Medicare Other | Admitting: Physician Assistant

## 2021-03-18 VITALS — BP 120/68 | HR 64 | Ht 63.0 in | Wt 263.0 lb

## 2021-03-18 DIAGNOSIS — I10 Essential (primary) hypertension: Secondary | ICD-10-CM

## 2021-03-18 DIAGNOSIS — G4733 Obstructive sleep apnea (adult) (pediatric): Secondary | ICD-10-CM

## 2021-03-18 DIAGNOSIS — Z8673 Personal history of transient ischemic attack (TIA), and cerebral infarction without residual deficits: Secondary | ICD-10-CM

## 2021-03-18 DIAGNOSIS — I2510 Atherosclerotic heart disease of native coronary artery without angina pectoris: Secondary | ICD-10-CM | POA: Diagnosis not present

## 2021-03-18 DIAGNOSIS — R002 Palpitations: Secondary | ICD-10-CM | POA: Diagnosis not present

## 2021-03-18 DIAGNOSIS — Z86718 Personal history of other venous thrombosis and embolism: Secondary | ICD-10-CM | POA: Diagnosis not present

## 2021-03-18 DIAGNOSIS — Z8719 Personal history of other diseases of the digestive system: Secondary | ICD-10-CM | POA: Diagnosis not present

## 2021-03-18 DIAGNOSIS — R600 Localized edema: Secondary | ICD-10-CM | POA: Diagnosis not present

## 2021-03-18 NOTE — Patient Instructions (Addendum)
Medication Instructions:  1.Increase furosemide (Lasix) to 40 mg (2 tablets) by mouth daily for 1 week then resume 20 mg (1 tablet) by mouth daily 2.Increase potassium to 20 meq (2 tablets) by mouth daily for 1 week then resume 10 meq (1 tablet) by mouth daily *If you need a refill on your cardiac medications before your next appointment, please call your pharmacy*   Lab Work: None If you have labs (blood work) drawn today and your tests are completely normal, you will receive your results only by: [place] (if you have MyChart) OR A paper copy in the mail If you have any lab test that is abnormal or we need to change your treatment, we will call you to review the results.   Follow-Up: At [facility], you and your health needs are our priority.  As part of our continuing mission to provide you with exceptional heart care, we have created designated Provider Care Teams.  These Care Teams include your primary Cardiologist (physician) and Advanced Practice Providers (APPs -  Physician Assistants and Nurse Practitioners) who all work together to provide you with the care you need, when you need it.   Your next appointment:   4 month(s), 07/20/21 at 3:40 PM  The format for your next appointment:   In Person  Provider:   Lauree Chandler, MD     Other Instructions 1.Your provider recommends that you maintain 150 minutes per week of moderate aerobic activity.  2.Your provider encouraged you to lose weight for better health.    Two Gram Sodium Diet 2000 mg  What is Sodium? Sodium is a mineral found naturally in many foods. The most significant source of sodium in the diet is table salt, which is about 40% sodium.  Processed, convenience, and preserved foods also contain a large amount of sodium.  The body needs only 500 mg of sodium daily to function,  A normal diet provides more than enough sodium even if you do not use salt.  Why Limit Sodium? A build up of sodium in the  body can cause thirst, increased blood pressure, shortness of breath, and water retention.  Decreasing sodium in the diet can reduce edema and risk of heart attack or stroke associated with high blood pressure.  Keep in mind that there are many other factors involved in these health problems.  Heredity, obesity, lack of exercise, cigarette smoking, stress and what you eat all play a role.  General Guidelines: Do not add salt at the table or in cooking.  One teaspoon of salt contains over 2 grams of sodium. Read food labels Avoid processed and convenience foods Ask your dietitian before eating any foods not dicussed in the menu planning guidelines Consult your physician if you wish to use a salt substitute or a sodium containing medication such as antacids.  Limit milk and milk products to 16 oz (2 cups) per day.  Shopping Hints: READ LABELS!! "Dietetic" does not necessarily mean low sodium. Salt and other sodium ingredients are often added to foods during processing.    Menu Planning Guidelines Food Group Choose More Often Avoid  Beverages (see also the milk group All fruit juices, low-sodium, salt-free vegetables juices, low-sodium carbonated beverages Regular vegetable or tomato juices, commercially softened water used for drinking or cooking  Breads and Cereals Enriched white, wheat, rye and pumpernickel bread, hard rolls and dinner rolls; muffins, cornbread and waffles; most dry cereals, cooked cereal without added salt; unsalted crackers and breadsticks; low sodium or homemade  bread crumbs Bread, rolls and crackers with salted tops; quick breads; instant hot cereals; pancakes; commercial bread stuffing; self-rising flower and biscuit mixes; regular bread crumbs or cracker crumbs  Desserts and Sweets Desserts and sweets mad with mild should be within allowance Instant pudding mixes and cake mixes  Fats Butter or margarine; vegetable oils; unsalted salad dressings, regular salad dressings  limited to 1 Tbs; light, sour and heavy cream Regular salad dressings containing bacon fat, bacon bits, and salt pork; snack dips made with instant soup mixes or processed cheese; salted nuts  Fruits Most fresh, frozen and canned fruits Fruits processed with salt or sodium-containing ingredient (some dried fruits are processed with sodium sulfites        Vegetables Fresh, frozen vegetables and low- sodium canned vegetables Regular canned vegetables, sauerkraut, pickled vegetables, and others prepared in brine; frozen vegetables in sauces; vegetables seasoned with ham, bacon or salt pork  Condiments, Sauces, Miscellaneous  Salt substitute with physician's approval; pepper, herbs, spices; vinegar, lemon or lime juice; hot pepper sauce; garlic powder, onion powder, low sodium soy sauce (1 Tbs.); low sodium condiments (ketchup, chili sauce, mustard) in limited amounts (1 tsp.) fresh ground horseradish; unsalted tortilla chips, pretzels, potato chips, popcorn, salsa (1/4 cup) Any seasoning made with salt including garlic salt, celery salt, onion salt, and seasoned salt; sea salt, rock salt, kosher salt; meat tenderizers; monosodium glutamate; mustard, regular soy sauce, barbecue, sauce, chili sauce, teriyaki sauce, steak sauce, Worcestershire sauce, and most flavored vinegars; canned gravy and mixes; regular condiments; salted snack foods, olives, picles, relish, horseradish sauce, catsup   Food preparation: Try these seasonings Meats:    Pork Sage, onion Serve with applesauce  Chicken Poultry seasoning, thyme, parsley Serve with cranberry sauce  Lamb Curry powder, rosemary, garlic, thyme Serve with mint sauce or jelly  Veal Marjoram, basil Serve with current jelly, cranberry sauce  Beef Pepper, bay leaf Serve with dry mustard, unsalted chive butter  Fish [place] leaf, dill Serve with unsalted lemon butter, unsalted parsley butter  Vegetables:    Asparagus Lemon juice   Broccoli Lemon juice   Carrots  Mustard dressing parsley, mint, nutmeg, glazed with unsalted butter and sugar   Green beans Marjoram, lemon juice, nutmeg,dill seed   Tomatoes Basil, marjoram, onion   Spice /blend for [employer]" 4 tsp ground thyme 1 tsp ground sage 3 tsp ground rosemary 4 tsp ground marjoram   Test your knowledge A product that says "Salt Free" may still contain sodium. True or False Garlic Powder and Hot Pepper Sauce an be used as alternative seasonings.True or False Processed foods have more sodium than fresh foods.  True or False Canned Vegetables have less sodium than froze True or False   WAYS TO DECREASE YOUR SODIUM INTAKE Avoid the use of added salt in cooking and at the table.  Table salt (and other prepared seasonings which contain salt) is probably one of the greatest sources of sodium in the diet.  Unsalted foods can gain flavor from the sweet, sour, and butter taste sensations of herbs and spices.  Instead of using salt for seasoning, try the following seasonings with the foods listed.  Remember: how you use them to enhance natural food flavors is limited only by your creativity... Allspice-Meat, fish, eggs, fruit, peas, red and yellow vegetables Almond Extract-Fruit baked goods Anise Seed-Sweet breads, fruit, carrots, beets, cottage cheese, cookies (tastes like licorice) Basil-Meat, fish, eggs, vegetables, rice, vegetables salads, soups, sauces Bay Leaf-Meat, fish, stews, poultry Burnet-Salad, vegetables (cucumber-like  flavor) Caraway Seed-Bread, cookies, cottage cheese, meat, vegetables, cheese, rice Cardamon-Baked goods, fruit, soups Celery Powder or seed-Salads, salad dressings, sauces, meatloaf, soup, bread.Do not use  celery salt Chervil-Meats, salads, fish, eggs, vegetables, cottage cheese (parsley-like flavor) Chili Power-Meatloaf, chicken cheese, corn, eggplant, egg dishes Chives-Salads cottage cheese, egg dishes, soups, vegetables, sauces Cilantro-Salsa,  casseroles Cinnamon-Baked goods, fruit, pork, lamb, chicken, carrots Cloves-Fruit, baked goods, fish, pot roast, green beans, beets, carrots Coriander-Pastry, cookies, meat, salads, cheese (lemon-orange flavor) Cumin-Meatloaf, fish,cheese, eggs, cabbage,fruit pie (caraway flavor) [employer], fruit, eggs, fish, poultry, cottage cheese, vegetables Dill Seed-Meat, cottage cheese, poultry, vegetables, fish, salads, bread Fennel Seed-Bread, cookies, apples, pork, eggs, fish, beets, cabbage, cheese, Licorice-like flavor Garlic-(buds or powder) Salads, meat, poultry, fish, bread, butter, vegetables, potatoes.Do not  use garlic salt Ginger-Fruit, vegetables, baked goods, meat, fish, poultry Horseradish Root-Meet, vegetables, butter Lemon Juice or Extract-Vegetables, fruit, tea, baked goods, fish salads Mace-Baked goods fruit, vegetables, fish, poultry (taste like nutmeg) Maple Extract-Syrups Marjoram-Meat, chicken, fish, vegetables, breads, green salads (taste like Sage) Mint-Tea, lamb, sherbet, vegetables, desserts, carrots, cabbage Mustard, Dry or Seed-Cheese, eggs, meats, vegetables, poultry Nutmeg-Baked goods, fruit, chicken, eggs, vegetables, desserts Onion Powder-Meat, fish, poultry, vegetables, cheese, eggs, bread, rice salads (Do not use   Onion salt) Orange Extract-Desserts, baked goods Oregano-Pasta, eggs, cheese, onions, pork, lamb, fish, chicken, vegetables, green salads Paprika-Meat, fish, poultry, eggs, cheese, vegetables Parsley Flakes-Butter, vegetables, meat fish, poultry, eggs, bread, salads (certain forms may   Contain sodium Pepper-Meat fish, poultry, vegetables, eggs Peppermint Extract-Desserts, baked goods Poppy Seed-Eggs, bread, cheese, fruit dressings, baked goods, noodles, vegetables, cottage  [employer], poultry, meat, fish, cauliflower, turnips,eggs bread Saffron-Rice, bread, veal, chicken, fish, eggs Sage-Meat, fish,  poultry, onions, eggplant, tomateos, pork, stews Savory-Eggs, salads, poultry, meat, rice, vegetables, soups, pork Tarragon-Meat, poultry, fish, eggs, butter, vegetables (licorice-like flavor)  Thyme-Meat, poultry, fish, eggs, vegetables, (clover-like flavor), sauces, soups Tumeric-Salads, butter, eggs, fish, rice, vegetables (saffron-like flavor) Vanilla Extract-Baked goods, candy Vinegar-Salads, vegetables, meat marinades Walnut Extract-baked goods, candy   2. Choose your Foods Wisely   The following is a list of foods to avoid which are high in sodium:  Meats-Avoid all smoked, canned, salt cured, dried and kosher meat and fish as well as Anchovies   [employer] meats:Bologna, Liverwurst, Pastrami Canned meat or fish  Marinated herring Caviar    Pepperoni Corned Beef   Pizza Dried chipped beef  Salami Frozen breaded fish or meat Salt pork Frankfurters or hot dogs  Sardines Gefilte fish   Sausage Ham (boiled ham, Proscuitto Smoked butt    spiced ham)   Spam      TV Dinners Vegetables Canned vegetables (Regular) Relish Canned mushrooms  Sauerkraut Olives    Tomato juice Pickles  Bakery and Dessert Products Canned puddings  Cream pies Cheesecake   Decorated cakes Cookies  Beverages/Juices Tomato juice, regular  Gatorade   V-8 vegetable juice, regular  Breads and Cereals Biscuit mixes   Salted potato chips, corn chips, pretzels Bread stuffing mixes  Salted crackers and rolls Pancake and waffle mixes Self-rising flour  Seasonings Accent    Meat sauces Barbecue sauce  Meat tenderizer Catsup    Monosodium glutamate (MSG) Celery salt   Onion salt Chili sauce   Prepared mustard Garlic salt   Salt, seasoned salt, sea salt Gravy mixes   Soy sauce Horseradish   Steak sauce Ketchup   Tartar sauce Lite salt    Teriyaki sauce Marinade mixes   Worcestershire sauce  Others Baking powder   Cocoa and cocoa mixes Baking soda   Commercial casserole  mixes Candy-caramels, chocolate  Dehydrated soups    Bars, fudge,nougats  Instant rice and pasta mixes Canned broth or soup  Maraschino cherries Cheese, aged and processed cheese and cheese spreads  Learning Assessment Quiz  Indicated T (for True) or F (for False) for each of the following statements:  _____ Fresh fruits and vegetables and unprocessed grains are generally low in sodium _____ Water may contain a considerable amount of sodium, depending on the source _____ You can always tell if a food is high in sodium by tasting it _____ Certain laxatives my be high in sodium and should be avoided unless prescribed   by a physician or pharmacist _____ Salt substitutes may be used freely by anyone on a sodium restricted diet _____ Sodium is present in table salt, food additives and as a natural component of   most foods _____ Table salt is approximately 90% sodium _____ Limiting sodium intake may help prevent excess fluid accumulation in the body _____ On a sodium-restricted diet, seasonings such as bouillon soy sauce, and    cooking wine should be used in place of table salt _____ On an ingredient list, a product which lists monosodium glutamate as the first   ingredient is an appropriate food to include on a low sodium diet  Circle the best answer(s) to the following statements (Hint: there may be more than one correct answer)  11. On a low-sodium diet, some acceptable snack items are:    A. Olives  F. Bean dip   K. Grapefruit juice    B. Salted Pretzels G. Commercial Popcorn   L. Canned peaches    C. Carrot Sticks  H. Bouillon   M. Unsalted nuts   D. [place] fries  I. Peanut butter crackers N. Salami   E. Sweet pickles J. Tomato Juice   O. Pizza  12.  Seasonings that may be used freely on a reduced - sodium diet include   A. Lemon wedges F.Monosodium glutamate K. Celery seed    B.Soysauce   G. Pepper   L. Mustard powder   C. Sea salt  H. Cooking wine  M. Onion flakes   D.  Vinegar  E. Prepared horseradish N. Salsa   E. Sage   J. Worcestershire sauce  O. [place]

## 2021-03-19 ENCOUNTER — Encounter: Payer: Self-pay | Admitting: Gynecologic Oncology

## 2021-03-23 ENCOUNTER — Encounter: Payer: Self-pay | Admitting: Gynecologic Oncology

## 2021-03-23 ENCOUNTER — Other Ambulatory Visit: Payer: Self-pay

## 2021-03-23 ENCOUNTER — Inpatient Hospital Stay: Payer: Medicare Other | Attending: Gynecologic Oncology | Admitting: Gynecologic Oncology

## 2021-03-23 VITALS — BP 151/77 | HR 90 | Temp 97.9°F | Resp 18 | Ht 63.0 in | Wt 264.0 lb

## 2021-03-23 DIAGNOSIS — I10 Essential (primary) hypertension: Secondary | ICD-10-CM | POA: Diagnosis not present

## 2021-03-23 DIAGNOSIS — Z8542 Personal history of malignant neoplasm of other parts of uterus: Secondary | ICD-10-CM | POA: Diagnosis present

## 2021-03-23 DIAGNOSIS — Z8673 Personal history of transient ischemic attack (TIA), and cerebral infarction without residual deficits: Secondary | ICD-10-CM | POA: Insufficient documentation

## 2021-03-23 DIAGNOSIS — M549 Dorsalgia, unspecified: Secondary | ICD-10-CM | POA: Insufficient documentation

## 2021-03-23 DIAGNOSIS — M2550 Pain in unspecified joint: Secondary | ICD-10-CM | POA: Insufficient documentation

## 2021-03-23 DIAGNOSIS — E785 Hyperlipidemia, unspecified: Secondary | ICD-10-CM | POA: Insufficient documentation

## 2021-03-23 DIAGNOSIS — Z7901 Long term (current) use of anticoagulants: Secondary | ICD-10-CM | POA: Insufficient documentation

## 2021-03-23 DIAGNOSIS — Z86718 Personal history of other venous thrombosis and embolism: Secondary | ICD-10-CM | POA: Diagnosis not present

## 2021-03-23 DIAGNOSIS — Z7982 Long term (current) use of aspirin: Secondary | ICD-10-CM | POA: Diagnosis not present

## 2021-03-23 DIAGNOSIS — Z6841 Body Mass Index (BMI) 40.0 and over, adult: Secondary | ICD-10-CM | POA: Insufficient documentation

## 2021-03-23 DIAGNOSIS — Z90710 Acquired absence of both cervix and uterus: Secondary | ICD-10-CM | POA: Diagnosis not present

## 2021-03-23 DIAGNOSIS — K219 Gastro-esophageal reflux disease without esophagitis: Secondary | ICD-10-CM | POA: Diagnosis not present

## 2021-03-23 DIAGNOSIS — I252 Old myocardial infarction: Secondary | ICD-10-CM | POA: Diagnosis not present

## 2021-03-23 DIAGNOSIS — R600 Localized edema: Secondary | ICD-10-CM | POA: Insufficient documentation

## 2021-03-23 DIAGNOSIS — Z90722 Acquired absence of ovaries, bilateral: Secondary | ICD-10-CM | POA: Insufficient documentation

## 2021-03-23 DIAGNOSIS — Z79899 Other long term (current) drug therapy: Secondary | ICD-10-CM | POA: Insufficient documentation

## 2021-03-23 DIAGNOSIS — C541 Malignant neoplasm of endometrium: Secondary | ICD-10-CM

## 2021-03-23 NOTE — Patient Instructions (Signed)
It was good to see you today.  I do not see or feel any evidence of cancer recurrence on your exam.  I will see you for follow-up in 6 months.  If you develop any new and concerning symptoms before then, please call the clinic to see me sooner.

## 2021-03-23 NOTE — Progress Notes (Signed)
Gynecologic Oncology Return Clinic Visit  03/23/2021  Reason for Visit: Surveillance visit in the setting of early stage uterine cancer  Treatment History: Oncology History Overview Note  IHC MMR intact MSI stable    Endometrial cancer ([place])  02/26/2019 Initial Biopsy   Endometrial biopsy revealed grade 1 endometrioid adenocarcinoma   02/26/2019 Initial Diagnosis   Endometrial cancer ([place])   03/27/2019 Imaging   CT abdomen and pelvis: Diffuse endometrial thickening, consistent with known endometrial carcinoma.  No evidence of metastatic disease within the abdomen or pelvis.  Several small uterine fibroids largest measuring 4 cm.   03/28/2019 Surgery   D&C: Pathology shows endometrioid carcinoma, FIGO grade 1-2   03/28/2019 Treatment Plan Change   Mirena IUD inserted   07/02/2019 Pathology Results   EMB: focal Gr1 endometrioid adenocarcinoma Stromal changes c/w hormone effect   09/11/2019 Surgery   TRH/BSO, SLN  On EUA, 8-10cm uterus, mobile. On intra-abdominal entry, normal upper abdominal survey. Normal appearing small and large bowel, omentum. Normal appearing bilateral adnexa. Uterus and bulbous, especially at the fundus, with several 1-2cm intra-mural fibroids. Mapping successful to bilateral obturator nodes. IUD noted within the uterus after specimen removal. NO intra-abdominal or pelvic evidence of disease.   09/11/2019 Pathology Results   IA, grade 1 endometrioid, no MI, negative SLNs, no LVSI   09/11/2019 Cancer Staging   Staging form: Corpus Uteri - Carcinoma and Carcinosarcoma, AJCC 8th Edition - Clinical stage from 09/11/2019: FIGO Stage IA (cT1a, cN0, cM0) - Signed by Lafonda Mosses, MD on 09/17/2019      Interval History: Presents for follow-up today.  Notes doing well.  Denies any vaginal bleeding or discharge.  Denies any significant abdominal or pelvic pain.  Reports regular bowel and bladder function.  Endorses a good appetite without nausea or  emesis.  Continues to struggle with lower extremity edema, joint pain, and back pain.  Saw her cardiologist most recently last week.  Blood pressure was under much better control.  She continues to struggle with lower extremity edema.  Past Medical/Surgical History: Past Medical History:  Diagnosis Date   BMI 40.0-44.9, adult ([place])    CAD in native artery, stent to RCA BMS with MI 2015 11/04/2003   a. inferior STEMI 01/2014 s/p BMS to prox RCA.   DVT (deep venous thrombosis) ([place]) 2003   lower extremity DVT with questionable recurrence in 2005. Another admission 2015.   Endometrial cancer ([place])    Endometrial thickening on ultra sound 07/2009   path showing Fragments of benign endocervical and squamous mucosa. No dysplasia or malignancy // Followed by Dr. [name]   First degree AV block    Gastric ulcer    GERD (gastroesophageal reflux disease)    GI bleed    Hemorrhoid    Hyperlipidemia LDL goal <70    Hypertension    Insomnia 11/02/2010   Memory difficulties 11/02/2010   Mild dilation of ascending aorta ([place])    Morbid obesity ([place]) 09/16/2012   Obstructive sleep apnea 09/23/2010   Pre-diabetes    Pulmonary nodule    ST elevation myocardial infarction (STEMI) of inferior wall ([place]) 01/24/2014   Stroke ([place]) 2015   Syncope 12/18/2013   Syncope, near 12/14/2018   TIA (transient ischemic attack) 09/23/2010   09/13/2010: CT, MRI/MRA no acute process. Carotid doppler: no stenosis. 2D-Echo Normal LV function with an EF 55-60%.    Trifascicular block    UTI (urinary tract infection) 01/27/2014    Past Surgical History:  Procedure Laterality Date  COLONOSCOPY W/ POLYPECTOMY  11/01/2007   8 mm rectal adenoma, hemorrhoids   CORONARY ANGIOPLASTY WITH STENT PLACEMENT  01/24/14   Inf. STEMI, BMS to RCA   [place] N/A 03/28/2019   Procedure: DILATATION AND CURETTAGE OF UTERUS;  Surgeon: Lafonda Mosses, MD;  [facility];  Service: Gynecology;   Laterality: N/A;   ESOPHAGOGASTRODUODENOSCOPY N/A 10/06/2018   Procedure: ESOPHAGOGASTRODUODENOSCOPY (EGD);  Surgeon: Doran Stabler, MD;  Location: [facility];  Service: Gastroenterology;  Laterality: N/A;   HOT HEMOSTASIS N/A 10/06/2018   Procedure: HOT HEMOSTASIS (ARGON PLASMA COAGULATION/BICAP);  Surgeon: Doran Stabler, MD;  Location: [facility];  Service: Gastroenterology;  Laterality: N/A;   INTRAUTERINE DEVICE (IUD) INSERTION N/A 03/28/2019   Procedure: INTRAUTERINE DEVICE (IUD) INSERTION;  Surgeon: Lafonda Mosses, MD;  [facility];  Service: Gynecology;  Laterality: N/A;   KNEE SURGERY  1996   Left knee arthroscopy.   LEFT HEART CATHETERIZATION WITH CORONARY ANGIOGRAM N/A 01/24/2014   Procedure: LEFT HEART CATHETERIZATION WITH CORONARY ANGIOGRAM;  Surgeon: Burnell Blanks, MD;  Location: [facility] CATH LAB;  Service: Cardiovascular;  Laterality: N/A;   ROBOTIC ASSISTED TOTAL HYSTERECTOMY WITH BILATERAL SALPINGO OOPHERECTOMY N/A 09/11/2019   Procedure: XI ROBOTIC ASSISTED TOTAL HYSTERECTOMY WITH BILATERAL SALPINGO OOPHORECTOMY, LYMPH NODE DISSECTION;  Surgeon: Lafonda Mosses, MD;  [facility];  Service: Gynecology;  Laterality: N/A;   ROTATOR CUFF REPAIR     right.   SCLEROTHERAPY  10/06/2018   Procedure: SCLEROTHERAPY;  Surgeon: Doran Stabler, MD;  Location: [facility] ENDOSCOPY;  Service: Gastroenterology;;   SENTINEL NODE BIOPSY N/A 09/11/2019   Procedure: SENTINEL NODE BIOPSY;  Surgeon: Lafonda Mosses, MD;  [facility];  Service: Gynecology;  Laterality: N/A;   TUBAL LIGATION      Family History  Problem Relation Age of Onset   Diabetes Mother    Hyperlipidemia Mother    Heart disease Mother    Dementia Mother    Angina Father    Heart attack Father    Thyroid cancer Sister    Liver cancer Sister    Heart disease Brother        x 2 in 6s yo   Alzheimer's disease Maternal Grandmother    Stroke Neg Hx    Colon cancer Neg Hx    Esophageal  cancer Neg Hx    Rectal cancer Neg Hx    Stomach cancer Neg Hx    Ovarian cancer Neg Hx    Endometrial cancer Neg Hx     Social History   Socioeconomic History   Marital status: Married    Spouse name: Not on file   Number of children: 6   Years of education: College   Highest education level: Not on file  Occupational History   Occupation: Control and instrumentation engineer   Occupation: TEACHER- [employer]    Employer: [employer]  Tobacco Use   Smoking status: Never   Smokeless tobacco: Never  Vaping Use   Vaping Use: Never used  Substance and Sexual Activity   Alcohol use: No   Drug use: No   Sexual activity: Not on file  Other Topics Concern   Not on file  Social History Narrative   Full Code status.   Insurance: BCBS   Social Determinants of Health   Financial Resource Strain: Not on file  Food Insecurity: Not on file  Transportation Needs: Not on file  Physical Activity: Not on file  Stress:  Not on file  Social Connections: Not on file    Current Medications:  Current Outpatient Medications:    acetaminophen (TYLENOL) 500 MG tablet, Take 500 mg by mouth 2 (two) times daily as needed (for pain)., Disp: , Rfl:    amLODipine (NORVASC) 10 MG tablet, Take 10 mg by mouth daily., Disp: , Rfl:    apixaban (ELIQUIS) 2.5 MG TABS tablet, Take 1 tablet (2.5 mg total) by mouth 2 (two) times daily., Disp: 60 tablet, Rfl: 5   aspirin EC 81 MG tablet, Swallow whole. Take one tablet 3 times week., Disp: 90 tablet, Rfl: 3   atorvastatin (LIPITOR) 80 MG tablet, TAKE 1 TABLET BY MOUTH EVERY DAY, Disp: 90 tablet, Rfl: 3   busPIRone (BUSPAR) 7.5 MG tablet, Take 7.5 mg by mouth 2 (two) times daily., Disp: , Rfl:    cholecalciferol (VITAMIN D3) 25 MCG (1000 UNIT) tablet, Take 1,000 Units by mouth daily., Disp: , Rfl:    Ferrous Gluconate 239 (27 Fe) MG TABS, Take by mouth., Disp: , Rfl:    furosemide (LASIX) 20 MG tablet, TAKE 40 MG (2 TABLETS) A DAY FOR 2 WEEKS AND THEN BACK  TO 20 MG (1 TABLET) DAILY BY MOUTH, Disp: 90 tablet, Rfl: 3   ibuprofen (ADVIL) 200 MG tablet, Take 200 mg by mouth every 6 (six) hours as needed for headache or moderate pain., Disp: , Rfl:    lisinopril (ZESTRIL) 40 MG tablet, Take 40 mg by mouth daily., Disp: , Rfl:    metoprolol succinate (TOPROL XL) 25 MG 24 hr tablet, Take 1 tablet (25 mg total) by mouth in the morning and at bedtime., Disp: 180 tablet, Rfl: 3   Multiple Vitamin (MULTIVITAMIN WITH MINERALS) TABS, Take 1 tablet by mouth every morning., Disp: , Rfl:    Naphazoline HCl (CLEAR EYES OP), Place 1 drop into both eyes as needed (for irritation or dryness). , Disp: , Rfl:    nitroGLYCERIN (NITROSTAT) 0.4 MG SL tablet, Place 1 tablet (0.4 mg total) under the tongue every 5 (five) minutes x 3 doses as needed for chest pain., Disp: 25 tablet, Rfl: 3   omeprazole (PRILOSEC) 20 MG capsule, Take 1 capsule (20 mg total) by mouth every other day., Disp: 45 capsule, Rfl: 3   potassium chloride (KLOR-CON) 10 MEQ tablet, TAKE 20 MG (2 TABLETS) A DAY FOR 2 WEEKS AND THEN BACK TO 10 MG (1 TABLET) DAILY BY MOUTH, Disp: 90 tablet, Rfl: 3   sodium chloride (OCEAN) 0.65 % SOLN nasal spray, Place 1 spray into both nostrils as needed for congestion., Disp: , Rfl:    telmisartan (MICARDIS) 80 MG tablet, Take 80 mg by mouth daily., Disp: , Rfl:   Review of Systems: Pertinent positives include ringing in ears, leg swelling, intermittent abdominal pain, itching, joint pain, anxiety. Denies appetite changes, fevers, chills, fatigue, unexplained weight changes. Denies hearing loss, neck lumps or masses, mouth sores, or voice changes. Denies cough or wheezing.  Denies shortness of breath. Denies chest pain or palpitations.  Denies abdominal distention, blood in stools, constipation, diarrhea, nausea, vomiting, or early satiety. Denies pain with intercourse, dysuria, frequency, hematuria or incontinence. Denies hot flashes, pelvic pain, vaginal bleeding or  vaginal discharge.   Denies back pain or muscle pain/cramps. Denies rash or wounds. Denies dizziness, headaches, numbness or seizures. Denies swollen lymph nodes or glands, denies easy bruising or bleeding. Denies depression, confusion, or decreased concentration.  Physical Exam: BP (!) 151/77 (BP Location: Left Arm, Patient Position: Sitting)  Pulse 90    Temp 97.9 F (36.6 C) (Tympanic)    Resp 18    Ht _0  (1.6 m)    Wt 264 lb (119.7 kg)    SpO2 99%    BMI 46.77 kg/m  General: Alert, oriented, no acute distress. HEENT: Normocephalic, atraumatic, sclera anicteric. Chest: Clear to auscultation bilaterally.  No wheezes or rhonchi. Cardiovascular: Regular rate and rhythm, no murmurs. Abdomen: Obese, soft, nontender.  Normoactive bowel sounds.  No masses or hepatosplenomegaly appreciated.  Well-healed incisions. Extremities: Grossly normal range of motion.  Warm, well perfused.  1-2+ edema bilaterally of lower extremities, compression stockings in place. Skin: No rashes or lesions noted. Lymphatics: No cervical, supraclavicular, or inguinal adenopathy. GU: Normal appearing external genitalia without erythema, excoriation, or lesions.  Speculum exam reveals mildly atrophic vaginal mucosa, no lesions or masses.  No bleeding or discharge.  Bimanual exam reveals cuff intact, no nodularity or masses.  Rectovaginal exam confirms these findings.  Laboratory & Radiologic Studies: None new  Assessment & Plan: Shirley [name] is a 80 y.o. woman with Stage IA grade 1 low-risk endometrial cancer who presents for surveillance visit.  Definitive surgery was in 09/2019.   Patient is NED on exam today.    We discussed signs and symptoms that would be concerning for disease recurrence and the patient understands to call if she develops any of these before her next scheduled visit.  We have previously discussed the difference in surveillance recommendations between NCCN and SGO.  At this time, we  will follow NCCN guidelines and continue with visits every 6 months.  When she is 2 years out from definitive surgery, we will reassess transitioning to yearly visits.   Unfortunately, the patient has gained weight since last time I saw her.  She notes that it has been hard for her to lose weight because of cold weather and not being as active as well as eating at different times than she is used to.  We discussed the importance of weight loss for her overall health but also on her back and joint pain.  32 minutes of total time was spent for this patient encounter, including preparation, face-to-face counseling with the patient and coordination of care, and documentation of the encounter.  Jeral Pinch, MD  Division of Gynecologic Oncology  Department of Obstetrics and Gynecology  [facility]

## 2021-03-24 DIAGNOSIS — E1151 Type 2 diabetes mellitus with diabetic peripheral angiopathy without gangrene: Secondary | ICD-10-CM | POA: Diagnosis not present

## 2021-04-28 DIAGNOSIS — E782 Mixed hyperlipidemia: Secondary | ICD-10-CM | POA: Diagnosis not present

## 2021-04-28 DIAGNOSIS — R5383 Other fatigue: Secondary | ICD-10-CM | POA: Diagnosis not present

## 2021-04-28 DIAGNOSIS — I10 Essential (primary) hypertension: Secondary | ICD-10-CM | POA: Diagnosis not present

## 2021-04-28 DIAGNOSIS — E1142 Type 2 diabetes mellitus with diabetic polyneuropathy: Secondary | ICD-10-CM | POA: Diagnosis not present

## 2021-04-28 DIAGNOSIS — D6869 Other thrombophilia: Secondary | ICD-10-CM | POA: Diagnosis not present

## 2021-04-28 DIAGNOSIS — D692 Other nonthrombocytopenic purpura: Secondary | ICD-10-CM | POA: Diagnosis not present

## 2021-05-06 DIAGNOSIS — D6869 Other thrombophilia: Secondary | ICD-10-CM | POA: Diagnosis not present

## 2021-05-06 DIAGNOSIS — M150 Primary generalized (osteo)arthritis: Secondary | ICD-10-CM | POA: Diagnosis not present

## 2021-05-06 DIAGNOSIS — E1142 Type 2 diabetes mellitus with diabetic polyneuropathy: Secondary | ICD-10-CM | POA: Diagnosis not present

## 2021-05-06 DIAGNOSIS — G4733 Obstructive sleep apnea (adult) (pediatric): Secondary | ICD-10-CM | POA: Diagnosis not present

## 2021-05-06 DIAGNOSIS — N182 Chronic kidney disease, stage 2 (mild): Secondary | ICD-10-CM | POA: Diagnosis not present

## 2021-05-06 DIAGNOSIS — D692 Other nonthrombocytopenic purpura: Secondary | ICD-10-CM | POA: Diagnosis not present

## 2021-05-06 DIAGNOSIS — I10 Essential (primary) hypertension: Secondary | ICD-10-CM | POA: Diagnosis not present

## 2021-05-06 DIAGNOSIS — E782 Mixed hyperlipidemia: Secondary | ICD-10-CM | POA: Diagnosis not present

## 2021-05-06 DIAGNOSIS — I700 Atherosclerosis of aorta: Secondary | ICD-10-CM | POA: Diagnosis not present

## 2021-05-06 DIAGNOSIS — Z23 Encounter for immunization: Secondary | ICD-10-CM | POA: Diagnosis not present

## 2021-05-06 DIAGNOSIS — Z0000 Encounter for general adult medical examination without abnormal findings: Secondary | ICD-10-CM | POA: Diagnosis not present

## 2021-06-08 ENCOUNTER — Ambulatory Visit: Payer: Medicare Other | Admitting: Cardiology

## 2021-06-22 DIAGNOSIS — B351 Tinea unguium: Secondary | ICD-10-CM | POA: Diagnosis not present

## 2021-06-22 DIAGNOSIS — E1151 Type 2 diabetes mellitus with diabetic peripheral angiopathy without gangrene: Secondary | ICD-10-CM | POA: Diagnosis not present

## 2021-07-01 DIAGNOSIS — B351 Tinea unguium: Secondary | ICD-10-CM | POA: Diagnosis not present

## 2021-07-01 DIAGNOSIS — L603 Nail dystrophy: Secondary | ICD-10-CM | POA: Diagnosis not present

## 2021-07-01 DIAGNOSIS — R0981 Nasal congestion: Secondary | ICD-10-CM | POA: Diagnosis not present

## 2021-07-01 DIAGNOSIS — J302 Other seasonal allergic rhinitis: Secondary | ICD-10-CM | POA: Diagnosis not present

## 2021-07-01 DIAGNOSIS — L608 Other nail disorders: Secondary | ICD-10-CM | POA: Diagnosis not present

## 2021-07-01 DIAGNOSIS — H938X2 Other specified disorders of left ear: Secondary | ICD-10-CM | POA: Diagnosis not present

## 2021-07-20 ENCOUNTER — Ambulatory Visit: Payer: Medicare Other | Admitting: Cardiovascular Disease

## 2021-07-20 NOTE — Progress Notes (Deleted)
? ?  ?   ? ?Date:  07/20/2021  ? ?ID:  Shirley [name], DOB 08/18/1940, MRN [id number] ? ?PCP:  Merrilee Seashore, MD  ?Cardiologist:  Lauree Chandler, MD  ?Electrophysiologist:  None  ? ?Evaluation Performed:  Follow-Up Visit ? ?Chief Complaint:  Follow up- CAD ? ?History of Present Illness:   ? ?81 yo female  with history of CAD with inferior STEMI October 2015, HTN, DVT, prior CVA, prior GI bleeding, chronic diastolic CHF and sleep apnea here today for cardiac follow up. She was admitted to [facility] October 2015 with an inferior STEMI. A bare metal stent was placed in the sub-totally occluded proximal RCA. The LAD and Circumflex had no disease. She has been on long term anti-coagulation with coumadin therapy for recurrent DVT. This has been changed to Eliquis. She was seen in our office June 2019 with chest pain. Nuclear stress test 09/27/17 was low risk with normal LV function and no ischemia. There was a possible apical lateral scar. Admitted with GI bleeding July 2020 and was seen by GI and had an upper endoscopy revealing a gastric ulcer. She was seen in our office in April 2022 with volume overload. Lasix was increased. Echo April 2022 with LVEF=60-65%. BNP was 64.  ? ?She is here today for follow up. The patient denies any chest pain, dyspnea, palpitations, lower extremity edema, orthopnea, PND, dizziness, near syncope or syncope.  ? ? ?Past Medical History:  ?Diagnosis Date  ? BMI 40.0-44.9, adult ([place])   ? CAD in native artery, stent to RCA BMS with MI 2015 11/04/2003  ? a. inferior STEMI 01/2014 s/p BMS to prox RCA.  ? DVT (deep venous thrombosis) ([place]) 2003  ? lower extremity DVT with questionable recurrence in 2005. Another admission 2015.  ? Endometrial cancer ([place])   ? Endometrial thickening on ultra sound 07/2009  ? path showing Fragments of benign endocervical and squamous mucosa. No dysplasia or malignancy // Followed by Dr. [name]  ? First degree AV block   ? Gastric ulcer   ? GERD  (gastroesophageal reflux disease)   ? GI bleed   ? Hemorrhoid   ? Hyperlipidemia LDL goal <70   ? Hypertension   ? Insomnia 11/02/2010  ? Memory difficulties 11/02/2010  ? Mild dilation of ascending aorta ([facility])   ? Morbid obesity ([place]) 09/16/2012  ? Obstructive sleep apnea 09/23/2010  ? Pre-diabetes   ? Pulmonary nodule   ? ST elevation myocardial infarction (STEMI) of inferior wall ([place]) 01/24/2014  ? Stroke [facility]) 2015  ? Syncope 12/18/2013  ? Syncope, near 12/14/2018  ? TIA (transient ischemic attack) 09/23/2010  ? 09/13/2010: CT, MRI/MRA no acute process. Carotid doppler: no stenosis. 2D-Echo Normal LV function with an EF 55-60%.   ? Trifascicular block   ? UTI (urinary tract infection) 01/27/2014  ? ?Past Surgical History:  ?Procedure Laterality Date  ? COLONOSCOPY W/ POLYPECTOMY  11/01/2007  ? 8 mm rectal adenoma, hemorrhoids  ? CORONARY ANGIOPLASTY WITH STENT PLACEMENT  01/24/14  ? Inf. STEMI, BMS to RCA  ? DILATION AND CURETTAGE OF UTERUS N/A 03/28/2019  ? Procedure: DILATATION AND CURETTAGE OF UTERUS;  Surgeon: Lafonda Mosses, MD;  [facility];  Service: Gynecology;  Laterality: N/A;  ? ESOPHAGOGASTRODUODENOSCOPY N/A 10/06/2018  ? Procedure: ESOPHAGOGASTRODUODENOSCOPY (EGD);  Surgeon: Doran Stabler, MD;  Location: [facility];  Service: Gastroenterology;  Laterality: N/A;  ? HOT HEMOSTASIS N/A 10/06/2018  ? Procedure: HOT HEMOSTASIS (ARGON PLASMA COAGULATION/BICAP);  Surgeon: Doran Stabler, MD;  Location: [facility];  Service: Gastroenterology;  Laterality: N/A;  ? INTRAUTERINE DEVICE (IUD) INSERTION N/A 03/28/2019  ? Procedure: INTRAUTERINE DEVICE (IUD) INSERTION;  Surgeon: Lafonda Mosses, MD;  [facility];  Service: Gynecology;  Laterality: N/A;  ? [place]  ? Left knee arthroscopy.  ? LEFT HEART CATHETERIZATION WITH CORONARY ANGIOGRAM N/A 01/24/2014  ? Procedure: LEFT HEART CATHETERIZATION WITH CORONARY ANGIOGRAM;  Surgeon: Burnell Blanks, MD;  Location: [facility] CATH  LAB;  Service: Cardiovascular;  Laterality: N/A;  ? ROBOTIC ASSISTED TOTAL HYSTERECTOMY WITH BILATERAL SALPINGO OOPHERECTOMY N/A 09/11/2019  ? Procedure: XI ROBOTIC ASSISTED TOTAL HYSTERECTOMY WITH BILATERAL SALPINGO OOPHORECTOMY, LYMPH NODE DISSECTION;  Surgeon: Lafonda Mosses, MD;  [facility];  Service: Gynecology;  Laterality: N/A;  ? ROTATOR CUFF REPAIR    ? right.  ? SCLEROTHERAPY  10/06/2018  ? Procedure: SCLEROTHERAPY;  Surgeon: Doran Stabler, MD;  Location: [place];  Service: Gastroenterology;;  ? SENTINEL NODE BIOPSY N/A 09/11/2019  ? Procedure: SENTINEL NODE BIOPSY;  Surgeon: Lafonda Mosses, MD;  [facility];  Service: Gynecology;  Laterality: N/A;  ? TUBAL LIGATION    ?  ? ?No outpatient medications have been marked as taking for the 07/20/21 encounter (Appointment) with Burnell Blanks, MD.  ?  ? ?Allergies:   Iohexol and Hydrochlorothiazide  ? ?Social History  ? ?Tobacco Use  ? Smoking status: Never  ? Smokeless tobacco: Never  ?Vaping Use  ? Vaping Use: Never used  ?Substance Use Topics  ? Alcohol use: No  ? Drug use: No  ?  ? ?Family Hx: ?The patient's family history includes Alzheimer's disease in her maternal grandmother; Angina in her father; Dementia in her mother; Diabetes in her mother; Heart attack in her father; Heart disease in her brother and mother; Hyperlipidemia in her mother; Liver cancer in her sister; Thyroid cancer in her sister. There is no history of Stroke, Colon cancer, Esophageal cancer, Rectal cancer, Stomach cancer, Ovarian cancer, or Endometrial cancer. ? ?ROS:   ?Please see the history of present illness.    ?All other systems reviewed and are negative. ? ? ?Prior CV studies:   ?The following studies were reviewed today: ? ?Echo May 2022:  ? 1. Left ventricular ejection fraction, by estimation, is 60 to 65%. The  ?left ventricle has normal function. The left ventricle has no regional  ?wall motion abnormalities. Left ventricular diastolic  parameters are  ?consistent with Grade II diastolic  ?dysfunction (pseudonormalization).  ? 2. Right ventricular systolic function is normal. The right ventricular  ?size is normal.  ? 3. The mitral valve is normal in structure. No evidence of mitral valve  ?regurgitation. No evidence of mitral stenosis.  ? 4. The aortic valve is normal in structure. Aortic valve regurgitation is  ?not visualized. No aortic stenosis is present.  ? 5. There is mild dilatation of the ascending aorta, measuring 43 mm.  ? 6. The inferior vena cava is normal in size with greater than 50%  ?respiratory variability, suggesting right atrial pressure of 3 mmHg.  ? ?Cardiac cath 01/24/14: ?Left main: No obstructive disease.   ?Left Anterior Descending Artery: Large caliber vessel that courses to the apex. Moderate caliber diagonal branch. No obstructive disease.   ?Circumflex Artery: Large caliber vessel with large obtuse marginal branch. No obstructive disease.   ?Right Coronary Artery: Moderate caliber co-dominant vessel with sub-totally occluded proximal vessel.  ?Left Ventricular Angiogram: LVEF=50% ? ?  Labs/Other Tests and Data Reviewed:   ? ?EKG:  EKG today is personally reviewed and shows *** ? ?Recent Labs: ?12/16/2020: ALT 20; BUN 10; Creatinine 0.72; Hemoglobin 14.1; Platelet Count 227; Potassium 4.5; Sodium 140  ? ?Recent Lipid Panel ?Lab Results  ?Component Value Date/Time  ? CHOL 129 10/17/2019 09:14 AM  ? TRIG 50 10/17/2019 09:14 AM  ? HDL 47 10/17/2019 09:14 AM  ? CHOLHDL 2.7 10/17/2019 09:14 AM  ? CHOLHDL 4 03/19/2014 07:45 AM  ? LDLCALC 71 10/17/2019 09:14 AM  ? ? ?Wt Readings from Last 3 Encounters:  ?03/23/21 264 lb (119.7 kg)  ?03/18/21 263 lb (119.3 kg)  ?12/16/20 251 lb 11.2 oz (114.2 kg)  ?  ? ?Objective:   ? ?Vital Signs:  There were no vitals taken for this visit.  ? ?General: Well developed, well nourished, NAD  ?HEENT: OP clear, mucus membranes moist  ?SKIN: warm, dry. No rashes. ?Neuro: No focal deficits   ?Musculoskeletal: Muscle strength 5/5 all ext  ?Psychiatric: Mood and affect normal  ?Neck: No JVD, no carotid bruits, no thyromegaly, no lymphadenopathy.  ?Lungs:Clear bilaterally, no wheezes, rhonci, crackles ?Cardiovascu

## 2021-08-03 ENCOUNTER — Emergency Department (HOSPITAL_COMMUNITY): Payer: Medicare Other

## 2021-08-03 ENCOUNTER — Emergency Department (HOSPITAL_COMMUNITY)
Admission: EM | Admit: 2021-08-03 | Discharge: 2021-08-04 | Disposition: A | Discharge status: Home / Self Care | Payer: Medicare Other | Attending: Emergency Medicine | Admitting: Emergency Medicine

## 2021-08-03 DIAGNOSIS — Z79899 Other long term (current) drug therapy: Secondary | ICD-10-CM | POA: Insufficient documentation

## 2021-08-03 DIAGNOSIS — I5033 Acute on chronic diastolic (congestive) heart failure: Secondary | ICD-10-CM

## 2021-08-03 DIAGNOSIS — D72829 Elevated white blood cell count, unspecified: Secondary | ICD-10-CM | POA: Diagnosis not present

## 2021-08-03 DIAGNOSIS — I110 Hypertensive heart disease with heart failure: Secondary | ICD-10-CM | POA: Diagnosis not present

## 2021-08-03 DIAGNOSIS — Z20822 Contact with and (suspected) exposure to covid-19: Secondary | ICD-10-CM | POA: Diagnosis not present

## 2021-08-03 DIAGNOSIS — J189 Pneumonia, unspecified organism: Secondary | ICD-10-CM | POA: Insufficient documentation

## 2021-08-03 DIAGNOSIS — Z7901 Long term (current) use of anticoagulants: Secondary | ICD-10-CM | POA: Insufficient documentation

## 2021-08-03 DIAGNOSIS — R2243 Localized swelling, mass and lump, lower limb, bilateral: Secondary | ICD-10-CM | POA: Diagnosis not present

## 2021-08-03 DIAGNOSIS — R051 Acute cough: Secondary | ICD-10-CM | POA: Diagnosis not present

## 2021-08-03 DIAGNOSIS — J810 Acute pulmonary edema: Secondary | ICD-10-CM | POA: Diagnosis not present

## 2021-08-03 DIAGNOSIS — R059 Cough, unspecified: Secondary | ICD-10-CM | POA: Diagnosis not present

## 2021-08-03 DIAGNOSIS — I509 Heart failure, unspecified: Secondary | ICD-10-CM | POA: Diagnosis not present

## 2021-08-03 DIAGNOSIS — I2510 Atherosclerotic heart disease of native coronary artery without angina pectoris: Secondary | ICD-10-CM | POA: Insufficient documentation

## 2021-08-03 DIAGNOSIS — R0602 Shortness of breath: Secondary | ICD-10-CM | POA: Diagnosis present

## 2021-08-03 LAB — CBC WITH DIFFERENTIAL/PLATELET
Abs Immature Granulocytes: 0.04 10*3/uL (ref 0.00–0.07)
Basophils Absolute: 0.1 10*3/uL (ref 0.0–0.1)
Basophils Relative: 1 %
Eosinophils Absolute: 0.3 10*3/uL (ref 0.0–0.5)
Eosinophils Relative: 3 %
HCT: 41.2 % (ref 36.0–46.0)
Hemoglobin: 13.3 g/dL (ref 12.0–15.0)
Immature Granulocytes: 0 %
Lymphocytes Relative: 26 %
Lymphs Abs: 3 10*3/uL (ref 0.7–4.0)
MCH: 28.5 pg (ref 26.0–34.0)
MCHC: 32.3 g/dL (ref 30.0–36.0)
MCV: 88.2 fL (ref 80.0–100.0)
Monocytes Absolute: 0.9 10*3/uL (ref 0.1–1.0)
Monocytes Relative: 8 %
Neutro Abs: 7.1 10*3/uL (ref 1.7–7.7)
Neutrophils Relative %: 62 %
Platelets: 180 10*3/uL (ref 150–400)
RBC: 4.67 MIL/uL (ref 3.87–5.11)
RDW: 13.6 % (ref 11.5–15.5)
WBC: 11.5 10*3/uL — ABNORMAL HIGH (ref 4.0–10.5)
nRBC: 0 % (ref 0.0–0.2)

## 2021-08-03 LAB — BASIC METABOLIC PANEL
Anion gap: 6 (ref 5–15)
BUN: 8 mg/dL (ref 8–23)
CO2: 26 mmol/L (ref 22–32)
Calcium: 8.8 mg/dL — ABNORMAL LOW (ref 8.9–10.3)
Chloride: 104 mmol/L (ref 98–111)
Creatinine, Ser: 0.65 mg/dL (ref 0.44–1.00)
GFR, Estimated: >60 mL/min (ref >60)
Glucose, Bld: 163 mg/dL — ABNORMAL HIGH (ref 70–99)
Potassium: 4.6 mmol/L (ref 3.5–5.1)
Sodium: 136 mmol/L (ref 135–145)

## 2021-08-03 LAB — BRAIN NATRIURETIC PEPTIDE: B Natriuretic Peptide: 147.7 pg/mL — ABNORMAL HIGH (ref 0.0–100.0)

## 2021-08-03 LAB — TROPONIN I (HIGH SENSITIVITY)
Troponin I (High Sensitivity): 4 ng/L (ref <18)
Troponin I (High Sensitivity): 5 ng/L (ref <18)

## 2021-08-03 NOTE — ED Triage Notes (Signed)
Pt sent from UC after xray revealed "fluid on the lungs." Pt endorses cough, SHOB x31mo Recent tx for ear infection, resp symptoms "written off as allergies until UC did the xray."  ?

## 2021-08-03 NOTE — ED Provider Triage Note (Signed)
Emergency Medicine Provider Triage Evaluation Note ? ?Shirley [name] , a 81 y.o. female  was evaluated in triage.  Pt complains of shortness of breath for the last 1 month.  Patient states that over the course of the last month she has began to notice increasing shortness of breath and leg swelling.  Patient has CHF, currently takes 40 mg Lasix in the morning and 20 mg at night.  The patient states that she has been compliant on this medication.  Patient reports that she was seen at urgent care earlier today and sent here for heart failure exacerbation, "fluid on lungs".  Patient states she also wears CPAP at night.  Patient states that this all began at the beginning of the month when she an ear infection, sinus congestion.  Patient denies any chest pain, nausea, vomiting, recent fevers. ? ?Review of Systems  ?Positive:  ?Negative:  ? ?Physical Exam  ?There were no vitals taken for this visit. ?Gen:   Awake, no distress   ?Resp:  Normal effort  ?MSK:   Moves extremities without difficulty  ?Other:  Rales on lungs.  2+ bilateral pitting edema. ? ?Medical Decision Making  ?Medically screening exam initiated at 6:13 PM.  Appropriate orders placed.  Shirley [name] was informed that the remainder of the evaluation will be completed by another provider, this initial triage assessment does not replace that evaluation, and the importance of remaining in the ED until their evaluation is complete. ? ? ?  ?Azucena Cecil, PA-C ?08/03/21 1814 ? ?

## 2021-08-04 ENCOUNTER — Encounter (HOSPITAL_COMMUNITY): Payer: Self-pay

## 2021-08-04 DIAGNOSIS — J189 Pneumonia, unspecified organism: Secondary | ICD-10-CM | POA: Diagnosis not present

## 2021-08-04 MED ORDER — AZITHROMYCIN 250 MG PO TABS: 250.0000 mg | ORAL_TABLET | Freq: Every day | ORAL | 0 refills | Status: DC

## 2021-08-04 MED ORDER — FUROSEMIDE 20 MG PO TABS
40.0000 mg | ORAL_TABLET | Freq: Once | ORAL | Status: AC
  Administered 2021-08-04: 40 mg via ORAL
  Filled 2021-08-04: qty 2

## 2021-08-04 NOTE — Discharge Instructions (Addendum)
If you develop new or worsening cough, coughing up blood, fever, trouble breathing, chest pain, or any other new/concerning symptoms then return to the ER for evaluation. ?

## 2021-08-04 NOTE — ED Provider Notes (Signed)
?[name] ?Provider Note ? ? ?CSN: [id number] ?Arrival date & time: 08/03/21  1619 ? ?  ? ?History ? ?Chief Complaint  ?Patient presents with  ? Shortness of Breath  ? Cough  ? ? ?Shirley [name] is a 81 y.o. female. ? ?HPI ?81 year old female with a history of CAD, DVT on blood thinner, diastolic CHF, and cancer presents with cough, congestion, and shortness of breath.  She has been having congestion and cough for about 1 month.  PCP thought it might be allergies and she was put on medicines for this but it did not help.  She has some soreness in her chest when she coughs but otherwise no chest pain.  She also has chronic leg swelling that seems about unchanged to her.  However she has been gaining weight without any other clear cause.  She denies fevers.  She went to urgent care and was told she had "fluid on the lungs" and needed to come here.  She takes 20 mg of Lasix daily. ? ?Home Medications ?Prior to Admission medications   ?Medication Sig Start Date End Date Taking? Authorizing Provider  ?azithromycin (ZITHROMAX) 250 MG tablet Take 1 tablet (250 mg total) by mouth daily. Take first 2 tablets together, then 1 every day until finished. 08/04/21  Yes Sherwood Gambler, MD  ?acetaminophen (TYLENOL) 500 MG tablet Take 500 mg by mouth 2 (two) times daily as needed (for pain).    [provider]  ?amLODipine (NORVASC) 10 MG tablet Take 10 mg by mouth daily. 03/07/21   [provider]  ?apixaban (ELIQUIS) 2.5 MG TABS tablet Take 1 tablet (2.5 mg total) by mouth 2 (two) times daily. 12/16/20   Alla Feeling, NP  ?aspirin EC 81 MG tablet Swallow whole. Take one tablet 3 times week. 04/21/20   Burnell Blanks, MD  ?atorvastatin (LIPITOR) 80 MG tablet TAKE 1 TABLET BY MOUTH EVERY DAY 12/10/20   Imogene Burn, PA-C  ?busPIRone (BUSPAR) 7.5 MG tablet Take 7.5 mg by mouth 2 (two) times daily. 09/21/20   [provider]  ?cholecalciferol (VITAMIN  D3) 25 MCG (1000 UNIT) tablet Take 1,000 Units by mouth daily.    [provider]  ?Ferrous Gluconate 239 (27 Fe) MG TABS Take by mouth.    [provider]  ?furosemide (LASIX) 20 MG tablet TAKE 40 MG (2 TABLETS) A DAY FOR 2 WEEKS AND THEN BACK TO 20 MG (1 TABLET) DAILY BY MOUTH 12/10/20   Imogene Burn, PA-C  ?ibuprofen (ADVIL) 200 MG tablet Take 200 mg by mouth every 6 (six) hours as needed for headache or moderate pain.    [provider]  ?lisinopril (ZESTRIL) 40 MG tablet Take 40 mg by mouth daily.    [provider]  ?metoprolol succinate (TOPROL XL) 25 MG 24 hr tablet Take 1 tablet (25 mg total) by mouth in the morning and at bedtime. 12/10/20   Imogene Burn, PA-C  ?Multiple Vitamin (MULTIVITAMIN WITH MINERALS) TABS Take 1 tablet by mouth every morning.    [provider]  ?Naphazoline HCl (CLEAR EYES OP) Place 1 drop into both eyes as needed (for irritation or dryness).     [provider]  ?nitroGLYCERIN (NITROSTAT) 0.4 MG SL tablet Place 1 tablet (0.4 mg total) under the tongue every 5 (five) minutes x 3 doses as needed for chest pain. 09/07/17   Belva Crome, MD  ?omeprazole (PRILOSEC) 20 MG capsule Take  1 capsule (20 mg total) by mouth every other day. 08/20/20   Nelida Meuse III, MD  ?potassium chloride (KLOR-CON) 10 MEQ tablet TAKE 20 MG (2 TABLETS) A DAY FOR 2 WEEKS AND THEN BACK TO 10 MG (1 TABLET) DAILY BY MOUTH 12/10/20   Imogene Burn, PA-C  ?sodium chloride (OCEAN) 0.65 % SOLN nasal spray Place 1 spray into both nostrils as needed for congestion.    [provider]  ?telmisartan (MICARDIS) 80 MG tablet Take 80 mg by mouth daily. 02/24/21   [provider]  ?   ? ?Allergies    ?Iohexol and Hydrochlorothiazide   ? ?Review of Systems   ?Review of Systems  ?Constitutional:  Negative for fever.  ?HENT:  Positive for congestion.   ?Respiratory:  Positive for cough.   ?Cardiovascular:  Positive for leg swelling. Negative for  chest pain.  ? ?Physical Exam ?Updated Vital Signs ?BP (!) 195/82 (BP Location: Right Arm)   Pulse 72   Temp 98.9 ?F (37.2 ?C) (Oral)   Resp 20   SpO2 97%  ?Physical Exam ?Vitals and nursing note reviewed.  ?Constitutional:   ?   Appearance: She is well-developed.  ?HENT:  ?   Head: Normocephalic and atraumatic.  ?Cardiovascular:  ?   Rate and Rhythm: Normal rate and regular rhythm.  ?   Heart sounds: Normal heart sounds.  ?Pulmonary:  ?   Effort: Pulmonary effort is normal.  ?   Breath sounds: Normal breath sounds. No wheezing, rhonchi or rales.  ?Abdominal:  ?   Palpations: Abdomen is soft.  ?   Tenderness: There is no abdominal tenderness.  ?Musculoskeletal:  ?   Right lower leg: Edema present.  ?   Left lower leg: Edema present.  ?   Comments: Diffuse swelling to bilateral lower extremities.  They are both currently in compression stockings.  ?Skin: ?   General: Skin is warm and dry.  ?Neurological:  ?   Mental Status: She is alert.  ? ? ?ED Results / Procedures / Treatments   ?Labs ?(all labs ordered are listed, but only abnormal results are displayed) ?Labs Reviewed  ?BASIC METABOLIC PANEL - Abnormal; Notable for the following components:  ?    Result Value  ? Glucose, Bld 163 (*)   ? Calcium 8.8 (*)   ? All other components within normal limits  ?CBC WITH DIFFERENTIAL/PLATELET - Abnormal; Notable for the following components:  ? WBC 11.5 (*)   ? All other components within normal limits  ?BRAIN NATRIURETIC PEPTIDE - Abnormal; Notable for the following components:  ? B Natriuretic Peptide 147.7 (*)   ? All other components within normal limits  ?TROPONIN I (HIGH SENSITIVITY)  ?TROPONIN I (HIGH SENSITIVITY)  ? ? ?EKG ?EKG Interpretation ? ?Date/Time:  Tuesday Aug 04 2021 07:42:46 EDT ?Ventricular Rate:  67 ?PR Interval:  254 ?QRS Duration: 112 ?QT Interval:  394 ?QTC Calculation: 416 ?R Axis:   -10 ?Text Interpretation: Sinus rhythm with 1st degree A-V block Incomplete right bundle branch block Left  anterior fascicular block no significant change since 2021 Confirmed by Sherwood Gambler [phone]) on 08/04/2021 8:01:30 AM ? ?Radiology ?DG Chest 2 View ? ?Result Date: 08/03/2021 ?CLINICAL DATA:  Cough, fluid on lungs. EXAM: CHEST - 2 VIEW COMPARISON:  Chest x-ray 10/05/2019. FINDINGS: The aorta is ectatic, unchanged. Cardiac silhouette is within normal limits. There is no focal lung infiltrate, pleural effusion or pneumothorax identified. No acute fractures are seen. IMPRESSION: No active cardiopulmonary disease.  Electronically Signed   By: Ronney Asters M.D.   On: 08/03/2021 19:04   ? ?Procedures ?Procedures  ? ? ?Medications Ordered in ED ?Medications  ?furosemide (LASIX) tablet 40 mg (40 mg Oral Given 08/04/21 0811)  ? ? ?ED Course/ Medical Decision Making/ A&P ?  ?                        ?Medical Decision Making ?Amount and/or Complexity of Data Reviewed ?Independent Historian:  ?   Details: daughter ?External Data Reviewed: labs, radiology and notes. ?Labs: ordered. ?Radiology: ordered and independent interpretation performed. ?ECG/medicine tests: ordered and independent interpretation performed. ? ?Risk ?Prescription drug management. ? ? ?Chart reviewed.  Went to urgent care yesterday and had a nonspecific chest x-ray read from radiology.  Chest x-ray images here reviewed by myself and there is no overt edema or pneumonia.  However clinically with the labs and a nonspecific BNP and her history of diastolic CHF on most recent echo, I suspect this is likely mild CHF.  There is also component that this could be an atypical pneumonia, especially based on the urgent care EKG read but also on the fact that she has had a cough and congestion and slight leukocytosis for a prolonged period.  We will give a course of azithromycin.  She appears pretty well.  Oxygen saturation is normal.  She is hypertensive here but I doubt this is a hypertensive crisis.  At this point I recommended to her and her daughter that she increase  the Lasix for the next 3 days and follow-up with PCP.  ECG reviewed/interpreted by myself and shows no acute ischemic changes. Labs show nonspecific white blood cell count but there is no overt infection.  Other elect

## 2021-08-05 ENCOUNTER — Ambulatory Visit (INDEPENDENT_AMBULATORY_CARE_PROVIDER_SITE_OTHER): Payer: Medicare Other | Admitting: Cardiology

## 2021-08-05 ENCOUNTER — Telehealth: Payer: Self-pay | Admitting: *Deleted

## 2021-08-05 VITALS — BP 118/72 | HR 66 | Ht 63.0 in | Wt 263.0 lb

## 2021-08-05 DIAGNOSIS — I10 Essential (primary) hypertension: Secondary | ICD-10-CM | POA: Diagnosis not present

## 2021-08-05 DIAGNOSIS — G4733 Obstructive sleep apnea (adult) (pediatric): Secondary | ICD-10-CM

## 2021-08-05 NOTE — Telephone Encounter (Signed)
-----   Message from Antonieta Iba, RN sent at 08/05/2021 11:29 AM EDT ----- ?Per Dr. Radford Pax: ?Please order supplies and get a download on her device. ?Thanks! ? ?

## 2021-08-05 NOTE — Progress Notes (Signed)
?Cardiology Office Note:   ? ?Date:  08/05/2021  ? ?ID:  Shirley [name], DOB 21-Sep-1940, MRN [id number] ? ?PCP:  Merrilee Seashore, MD  ?Cardiologist:  Lauree Chandler, MD   ? ?Referring MD: Merrilee Seashore, MD  ? ?Chief Complaint  ?Patient presents with  ? Sleep Apnea  ? Hypertension  ? ? ?History of Present Illness:   ? ?Shirley [name] is a 81 y.o. female with a hx of ASCHD, DVT, GERD, hyperlipidemia, prediabetes, morbid obesity, hypertension, TIA and obstructive sleep apnea.  She is now referred to establish sleep care. ? ?At her last cardiology office visit she was complaining of having palpitations that wake her up from sleep.  She was using her CPAP but having difficulty with the mask.  She is now here to establish care.She is doing well with her CPAP device and thinks that she has gotten used to it.  She tolerates the mask and feels the pressure is adequate.  Since going on CPAP she feels rested in the am and has no significant daytime sleepiness but occasionally will take a nap during the day.  She denies any significant mouth or nasal dryness or nasal congestion.  She does not think that she snores.   She needs to be set up with a DME to get supplies.  ? ?Past Medical History:  ?Diagnosis Date  ? BMI 40.0-44.9, adult ([facility] [facility])   ? CAD in native artery, stent to RCA BMS with MI 2015 11/04/2003  ? a. inferior STEMI 01/2014 s/p BMS to prox RCA.  ? DVT (deep venous thrombosis) ([place]) 2003  ? lower extremity DVT with questionable recurrence in 2005. Another admission 2015.  ? Endometrial cancer ([place])   ? Endometrial thickening on ultra sound 07/2009  ? path showing Fragments of benign endocervical and squamous mucosa. No dysplasia or malignancy // Followed by Dr. [name]  ? First degree AV block   ? Gastric ulcer   ? GERD (gastroesophageal reflux disease)   ? GI bleed   ? Hemorrhoid   ? Hyperlipidemia LDL goal <70   ? Hypertension   ? Insomnia 11/02/2010  ? Memory difficulties 11/02/2010  ?  Mild dilation of ascending aorta ([facility])   ? Morbid obesity ([place]) 09/16/2012  ? Obstructive sleep apnea 09/23/2010  ? Pre-diabetes   ? Pulmonary nodule   ? ST elevation myocardial infarction (STEMI) of inferior wall ([place]) 01/24/2014  ? Stroke [facility]) 2015  ? Syncope 12/18/2013  ? Syncope, near 12/14/2018  ? TIA (transient ischemic attack) 09/23/2010  ? 09/13/2010: CT, MRI/MRA no acute process. Carotid doppler: no stenosis. 2D-Echo Normal LV function with an EF 55-60%.   ? Trifascicular block   ? UTI (urinary tract infection) 01/27/2014  ? ? ?Past Surgical History:  ?Procedure Laterality Date  ? COLONOSCOPY W/ POLYPECTOMY  11/01/2007  ? 8 mm rectal adenoma, hemorrhoids  ? CORONARY ANGIOPLASTY WITH STENT PLACEMENT  01/24/14  ? Inf. STEMI, BMS to RCA  ? DILATION AND CURETTAGE OF UTERUS N/A 03/28/2019  ? Procedure: DILATATION AND CURETTAGE OF UTERUS;  Surgeon: Lafonda Mosses, MD;  Location: [facility];  Service: Gynecology;  Laterality: N/A;  ? ESOPHAGOGASTRODUODENOSCOPY N/A 10/06/2018  ? Procedure: ESOPHAGOGASTRODUODENOSCOPY (EGD);  Surgeon: Doran Stabler, MD;  Location: [facility];  Service: Gastroenterology;  Laterality: N/A;  ? HOT HEMOSTASIS N/A 10/06/2018  ? Procedure: HOT HEMOSTASIS (ARGON PLASMA COAGULATION/BICAP);  Surgeon: Doran Stabler, MD;  Location: [facility];  Service: Gastroenterology;  Laterality:  N/A;  ? INTRAUTERINE DEVICE (IUD) INSERTION N/A 03/28/2019  ? Procedure: INTRAUTERINE DEVICE (IUD) INSERTION;  Surgeon: Lafonda Mosses, MD;  Location: [facility];  Service: Gynecology;  Laterality: N/A;  ? [place]  ? Left knee arthroscopy.  ? LEFT HEART CATHETERIZATION WITH CORONARY ANGIOGRAM N/A 01/24/2014  ? Procedure: LEFT HEART CATHETERIZATION WITH CORONARY ANGIOGRAM;  Surgeon: Burnell Blanks, MD;  Location: [facility] CATH [facility];  Service: Cardiovascular;  Laterality: N/A;  ? ROBOTIC ASSISTED TOTAL HYSTERECTOMY WITH BILATERAL SALPINGO OOPHERECTOMY N/A 09/11/2019  ? Procedure: XI ROBOTIC ASSISTED  TOTAL HYSTERECTOMY WITH BILATERAL SALPINGO OOPHORECTOMY, LYMPH NODE DISSECTION;  Surgeon: Lafonda Mosses, MD;  Location: [facility];  Service: Gynecology;  Laterality: N/A;  ? ROTATOR CUFF REPAIR    ? right.  ? SCLEROTHERAPY  10/06/2018  ? Procedure: SCLEROTHERAPY;  Surgeon: Doran Stabler, MD;  Location: [facility];  Service: Gastroenterology;;  ? SENTINEL NODE BIOPSY N/A 09/11/2019  ? Procedure: SENTINEL NODE BIOPSY;  Surgeon: Lafonda Mosses, MD;  Location: [facility];  Service: Gynecology;  Laterality: N/A;  ? TUBAL LIGATION    ? ? ?Current Medications: ?Current Meds  ?Medication Sig  ? acetaminophen (TYLENOL) 500 MG tablet Take 500 mg by mouth 2 (two) times daily as needed (for pain).  ? amLODipine (NORVASC) 10 MG tablet Take 10 mg by mouth daily.  ? apixaban (ELIQUIS) 2.5 MG TABS tablet Take 1 tablet (2.5 mg total) by mouth 2 (two) times daily.  ? aspirin EC 81 MG tablet Swallow whole. Take one tablet 3 times week.  ? atorvastatin (LIPITOR) 80 MG tablet TAKE 1 TABLET BY MOUTH EVERY DAY  ? azithromycin (ZITHROMAX) 250 MG tablet Take 1 tablet (250 mg total) by mouth daily. Take first 2 tablets together, then 1 every day until finished.  ? busPIRone (BUSPAR) 7.5 MG tablet Take 7.5 mg by mouth 2 (two) times daily.  ? cholecalciferol (VITAMIN D3) 25 MCG (1000 UNIT) tablet Take 1,000 Units by mouth daily.  ? Ferrous Gluconate 239 (27 Fe) MG TABS Take by mouth.  ? furosemide (LASIX) 20 MG tablet TAKE 40 MG (2 TABLETS) A DAY FOR 2 WEEKS AND THEN BACK TO 20 MG (1 TABLET) DAILY BY MOUTH  ? ibuprofen (ADVIL) 200 MG tablet Take 200 mg by mouth every 6 (six) hours as needed for headache or moderate pain.  ? lisinopril (ZESTRIL) 40 MG tablet Take 40 mg by mouth daily.  ? metoprolol succinate (TOPROL XL) 25 MG 24 hr tablet Take 1 tablet (25 mg total) by mouth in the morning and at bedtime.  ? Multiple Vitamin (MULTIVITAMIN WITH MINERALS) TABS Take 1 tablet by mouth every morning.  ? Naphazoline HCl (CLEAR EYES OP) Place 1  drop into both eyes as needed (for irritation or dryness).   ? nitroGLYCERIN (NITROSTAT) 0.4 MG SL tablet Place 1 tablet (0.4 mg total) under the tongue every 5 (five) minutes x 3 doses as needed for chest pain.  ? omeprazole (PRILOSEC) 20 MG capsule Take 1 capsule (20 mg total) by mouth every other day.  ? potassium chloride (KLOR-CON) 10 MEQ tablet TAKE 20 MG (2 TABLETS) A DAY FOR 2 WEEKS AND THEN BACK TO 10 MG (1 TABLET) DAILY BY MOUTH  ? sodium chloride (OCEAN) 0.65 % SOLN nasal spray Place 1 spray into both nostrils as needed for congestion.  ? telmisartan (MICARDIS) 80 MG tablet Take 80 mg by mouth daily.  ?  ? ?Allergies:   Iohexol and Hydrochlorothiazide  ? ?  Social History  ? ?Socioeconomic History  ? Marital status: Married  ?  Spouse name: Not on file  ? Number of children: 6  ? Years of education: College  ? Highest education level: Not on file  ?Occupational History  ? Occupation: Control and instrumentation engineer  ? Occupation: TEACHER- [employer]  ?  Employer: [employer]  ?Tobacco Use  ? Smoking status: Never  ? Smokeless tobacco: Never  ?Vaping Use  ? Vaping Use: Never used  ?Substance and Sexual Activity  ? Alcohol use: No  ? Drug use: No  ? Sexual activity: Not on file  ?Other Topics Concern  ? Not on file  ?Social History Narrative  ? Full Code status.  ? Insurance: BCBS  ? ?Social Determinants of Health  ? ?Financial Resource Strain: Not on file  ?Food Insecurity: Not on file  ?Transportation Needs: Not on file  ?Physical Activity: Not on file  ?Stress: Not on file  ?Social Connections: Not on file  ?  ? ?Family History: ?The patient's family history includes Alzheimer's disease in her maternal grandmother; Angina in her father; Dementia in her mother; Diabetes in her mother; Heart attack in her father; Heart disease in her brother and mother; Hyperlipidemia in her mother; Liver cancer in her sister; Thyroid cancer in her sister. There is no history of Stroke, Colon cancer, Esophageal cancer,  Rectal cancer, Stomach cancer, Ovarian cancer, or Endometrial cancer. ? ?ROS:   ?Please see the history of present illness.    ?ROS  ?All other systems reviewed and negative.  ? ?EKGs/Labs/Other Studies

## 2021-08-05 NOTE — Patient Instructions (Signed)

## 2021-08-05 NOTE — Telephone Encounter (Addendum)
Supplies order placed to [facility] ? ?Per dr Radford Pax split night sleep study ordered. ?

## 2021-08-06 NOTE — Telephone Encounter (Signed)
Split night ordered 

## 2021-08-07 DIAGNOSIS — Z09 Encounter for follow-up examination after completed treatment for conditions other than malignant neoplasm: Secondary | ICD-10-CM | POA: Diagnosis not present

## 2021-08-07 DIAGNOSIS — R0981 Nasal congestion: Secondary | ICD-10-CM | POA: Diagnosis not present

## 2021-08-07 DIAGNOSIS — H938X2 Other specified disorders of left ear: Secondary | ICD-10-CM | POA: Diagnosis not present

## 2021-08-07 DIAGNOSIS — R052 Subacute cough: Secondary | ICD-10-CM | POA: Diagnosis not present

## 2021-08-11 NOTE — Progress Notes (Signed)
Order placed to [facility] to Order ResMed CPAP at 9 cm H2O with heated humidity and mask of choice and supplies

## 2021-08-11 NOTE — Telephone Encounter (Signed)
Split night study cancelled patients sleep study was found and new cpap and supplies ordered with [facility]. ?

## 2021-08-14 DIAGNOSIS — R059 Cough, unspecified: Secondary | ICD-10-CM | POA: Diagnosis not present

## 2021-08-14 DIAGNOSIS — R600 Localized edema: Secondary | ICD-10-CM | POA: Diagnosis not present

## 2021-08-14 DIAGNOSIS — R0602 Shortness of breath: Secondary | ICD-10-CM | POA: Diagnosis not present

## 2021-09-03 DIAGNOSIS — B353 Tinea pedis: Secondary | ICD-10-CM | POA: Diagnosis not present

## 2021-09-07 DIAGNOSIS — R059 Cough, unspecified: Secondary | ICD-10-CM | POA: Diagnosis not present

## 2021-09-13 ENCOUNTER — Encounter (HOSPITAL_BASED_OUTPATIENT_CLINIC_OR_DEPARTMENT_OTHER): Payer: Medicare Other | Admitting: Cardiology

## 2021-09-14 ENCOUNTER — Other Ambulatory Visit: Payer: Self-pay | Admitting: Physician Assistant

## 2021-09-21 ENCOUNTER — Institutional Professional Consult (permissible substitution): Payer: Medicare Other | Admitting: Pulmonary Disease

## 2021-09-21 ENCOUNTER — Encounter: Payer: Self-pay | Admitting: Pulmonary Disease

## 2021-09-21 ENCOUNTER — Ambulatory Visit (INDEPENDENT_AMBULATORY_CARE_PROVIDER_SITE_OTHER): Payer: Medicare Other | Admitting: Pulmonary Disease

## 2021-09-21 ENCOUNTER — Ambulatory Visit: Payer: Medicare Other | Admitting: Gynecologic Oncology

## 2021-09-21 VITALS — BP 130/68 | HR 60 | Ht 63.0 in | Wt 261.4 lb

## 2021-09-21 DIAGNOSIS — G4733 Obstructive sleep apnea (adult) (pediatric): Secondary | ICD-10-CM | POA: Diagnosis not present

## 2021-09-21 DIAGNOSIS — R062 Wheezing: Secondary | ICD-10-CM | POA: Diagnosis not present

## 2021-09-21 MED ORDER — BUDESONIDE 0.5 MG/2ML IN SUSP: 0.5000 mg | Freq: Two times a day (BID) | RESPIRATORY_TRACT | 6 refills | Status: DC

## 2021-09-21 NOTE — Patient Instructions (Signed)
Nice to meet you  We will increase to budesonide nebulizer to the maximum dose, new prescription was sent today.  If there is any issues with the cost or obtaining the medication please let me know.    If you are wheezing, use albuterol with a spacer as needed, 2 puffs up to every 6 hours.  My hope is the change in strength to the increased strength of budesonide will decrease the frequency of your wheeze and eventually get rid of it.  If after 1 month on the higher dose your symptoms are unchanged, please let me know and we will go to Plan B, adding a second nebulized medicine or switching to a puffer with both medications in it.   Return to clinic in 3 months or sooner as needed with Dr. Silas Flood.

## 2021-09-21 NOTE — Progress Notes (Signed)
$'@Patient'A$  ID: Shirley [name], female    DOB: 1941/02/19, 81 y.o.   MRN: [id number]  Chief Complaint  Patient presents with   Consult    Pt is here for consult for cough and wheezing. Pt states that this has been occurring since April . It started off with PNA.     Referring provider: Merrilee Seashore, MD  HPI:   81 y.o. woman whom are seen in consultation for evaluation of wheeze, mild dyspnea on exertion.  Note from PCP reviewed.  Patient was in usual health.  Developed ear pain, sore throat, runny nose, cough, chest tightness and wheeze.  This was April 2023.  Got some antibiotics.  Mild improvement.  Seen by PCP.  Given prednisone and additional antibiotics.  Prednisone helped immensely.  Sore throat, ear pain, cough largely improved.  Mild residual cough.  Wheezes main thing that persist.  No time of day when things are better or worse.  No position make things better or worse.  No seasonal environmental factors she can identify to make things better or worse.  Does not use albuterol afraid of how will make her feel.  Prescribe budesonide nebulized 0.25 mg twice daily.  Feels this does help a bit.  Helps with decreasing the frequency of wheeze.  She denies significant allergy symptoms except for the spring.  No recurrent bronchitis.  No prior history of asthma.  No other respiratory illness.  She had chest x-ray 08/2021 that on my review interpretation reveals clear lungs bilaterally.  Most recent cross-sectional imaging CTA chest 10/2019 reviewed interpreted as clear lungs bilaterally.  PMH: OSA on CPAP hypertension, Surgical history: Tubal ligation, total hysterectomy and oophorectomy, D&C of uterus, rotator cuff repair Family history: Mother with CAD, hyperlipidemia, diabetes, Father with CAD Social history: Never smoker, lives in [place] / Pulmonary Flowsheets:   ACT:      No data to display          MMRC:     No data to display           Epworth:      No data to display          Tests:   FENO:  No results found for: "NITRICOXIDE"  PFT:     No data to display          WALK:      No data to display          Imaging: Personally reviewed and as per EMR discussion this note No results found.  Lab Results: Personally reviewed CBC    Component Value Date/Time   WBC 11.5 (H) 08/03/2021 1826   RBC 4.67 08/03/2021 1826   HGB 13.3 08/03/2021 1826   HGB 14.1 12/16/2020 1004   HGB 13.3 10/17/2019 0914   HGB 12.4 12/13/2014 0857   HCT 41.2 08/03/2021 1826   HCT 41.2 10/17/2019 0914   HCT 37.4 12/13/2014 0857   PLT 180 08/03/2021 1826   PLT 227 12/16/2020 1004   PLT 267 10/17/2019 0914   MCV 88.2 08/03/2021 1826   MCV 86 10/17/2019 0914   MCV 84.2 12/13/2014 0857   MCH 28.5 08/03/2021 1826   MCHC 32.3 08/03/2021 1826   RDW 13.6 08/03/2021 1826   RDW 13.1 10/17/2019 0914   RDW 14.7 (H) 12/13/2014 0857   LYMPHSABS 3.0 08/03/2021 1826   LYMPHSABS 6.3 (H) 12/13/2014 0857   MONOABS 0.9 08/03/2021 1826   MONOABS 0.7 12/13/2014 0857  EOSABS 0.3 08/03/2021 1826   EOSABS 0.4 12/13/2014 0857   BASOSABS 0.1 08/03/2021 1826   BASOSABS 0.1 12/13/2014 0857    BMET    Component Value Date/Time   NA 136 08/03/2021 1826   NA 137 07/09/2020 0831   NA 137 12/13/2014 0857   K 4.6 08/03/2021 1826   K 3.8 12/13/2014 0857   CL 104 08/03/2021 1826   CO2 26 08/03/2021 1826   CO2 28 12/13/2014 0857   GLUCOSE 163 (H) 08/03/2021 1826   GLUCOSE 98 12/13/2014 0857   BUN 8 08/03/2021 1826   BUN 12 07/09/2020 0831   BUN 10.6 12/13/2014 0857   CREATININE 0.65 08/03/2021 1826   CREATININE 0.72 12/16/2020 1004   CREATININE 0.7 12/13/2014 0857   CALCIUM 8.8 (L) 08/03/2021 1826   CALCIUM 8.8 12/13/2014 0857   GFRNONAA >60 08/03/2021 1826   GFRNONAA >60 12/16/2020 1004   GFRAA >60 12/06/2019 0923    BNP    Component Value Date/Time   BNP 147.7 (H) 08/03/2021 1826    ProBNP    Component Value  Date/Time   PROBNP 64 07/09/2020 0831   PROBNP 64.6 09/15/2012 2345    Specialty Problems       Pulmonary Problems   Obstructive sleep apnea    Allergies  Allergen Reactions   Iohexol Rash and Other (See Comments)    Patient stated a rash appeared after a CT scan    Hydrochlorothiazide Other (See Comments)    Immunization History  Administered Date(s) Administered   Influenza, High Dose Seasonal PF 12/26/2014, 12/15/2015   Influenza, Quadrivalent, Recombinant, Inj, Pf 01/06/2017, 01/04/2019, 01/11/2020, 01/16/2021   Influenza,inj,Quad PF,6+ Mos 12/20/2013   Influenza-Unspecified 01/04/2012, 12/04/2016, 12/05/2018   PFIZER Comirnaty(Gray Top)Covid-19 Tri-Sucrose Vaccine 08/15/2020   PFIZER(Purple Top)SARS-COV-2 Vaccination 04/17/2019, 05/08/2019, 12/03/2019, 12/04/2019   PNEUMOCOCCAL CONJUGATE-20 05/06/2021   Pfizer Covid-19 Vaccine Bivalent Booster 7yr & up 01/07/2021   Pneumococcal Conjugate-13 02/10/2015   Pneumococcal Polysaccharide-23 11/02/2010, 01/04/2012   Tdap 11/02/2010    Past Medical History:  Diagnosis Date   BMI 40.0-44.9, [place]    CAD in native artery, stent to RCA BMS with MI 2015 11/04/2003   a. inferior STEMI 01/2014 s/p BMS to prox RCA.   DVT (deep venous thrombosis) [place] 2003   lower extremity DVT with questionable recurrence in 2005. Another admission 2015.   Endometrial cancer ([facility]    Endometrial thickening on ultra sound 07/2009   path showing Fragments of benign endocervical and squamous mucosa. No dysplasia or malignancy // Followed by Dr. AGus Height  First degree AV block    Gastric ulcer    GERD (gastroesophageal reflux disease)    GI bleed    Hemorrhoid    Hyperlipidemia LDL goal <70    Hypertension    Insomnia 11/02/2010   Memory difficulties 11/02/2010   Mild dilation of ascending aorta ([facility]    Morbid obesity ([facility] 09/16/2012   Obstructive sleep apnea 09/23/2010   Pre-diabetes    Pulmonary nodule    ST elevation myocardial  infarction (STEMI) of inferior wall ([facility] 01/24/2014   Stroke [place]   Syncope 12/18/2013   Syncope, near 12/14/2018   TIA (transient ischemic attack) 09/23/2010   09/13/2010: CT, MRI/MRA no acute process. Carotid doppler: no stenosis. 2D-Echo Normal LV function with an EF 55-60%.    Trifascicular block    UTI (urinary tract infection) 01/27/2014    Tobacco History: Social History   Tobacco Use  Smoking Status Never  Smokeless  Tobacco Never   Counseling given: Not Answered   Continue to not smoke  Outpatient Encounter Medications as of 09/21/2021  Medication Sig   acetaminophen (TYLENOL) 500 MG tablet Take 500 mg by mouth 2 (two) times daily as needed (for pain).   albuterol (PROVENTIL) (2.5 MG/3ML) 0.083% nebulizer solution SMARTSIG:3 Milliliter(s) Via Inhaler 4 Times Daily PRN   amLODipine (NORVASC) 10 MG tablet Take 10 mg by mouth daily.   apixaban (ELIQUIS) 2.5 MG TABS tablet Take 1 tablet (2.5 mg total) by mouth 2 (two) times daily.   aspirin EC 81 MG tablet Swallow whole. Take one tablet 3 times week.   atorvastatin (LIPITOR) 80 MG tablet TAKE 1 TABLET BY MOUTH EVERY DAY   budesonide (PULMICORT) 0.5 MG/2ML nebulizer solution Take 2 mLs (0.5 mg total) by nebulization 2 (two) times daily.   busPIRone (BUSPAR) 7.5 MG tablet Take 7.5 mg by mouth 2 (two) times daily.   cholecalciferol (VITAMIN D3) 25 MCG (1000 UNIT) tablet Take 1,000 Units by mouth daily.   Ferrous Gluconate 239 (27 Fe) MG TABS Take by mouth.   furosemide (LASIX) 20 MG tablet Take 1 tablet (20 mg total) by mouth daily.   ibuprofen (ADVIL) 200 MG tablet Take 200 mg by mouth every 6 (six) hours as needed for headache or moderate pain.   lisinopril (ZESTRIL) 40 MG tablet Take 40 mg by mouth daily.   metoprolol succinate (TOPROL-XL) 25 MG 24 hr tablet TAKE 1 TABLET (25 MG TOTAL) BY MOUTH IN THE MORNING AND AT BEDTIME.   Multiple Vitamin (MULTIVITAMIN WITH MINERALS) TABS Take 1 tablet by mouth every morning.    Naphazoline HCl (CLEAR EYES OP) Place 1 drop into both eyes as needed (for irritation or dryness).    nitroGLYCERIN (NITROSTAT) 0.4 MG SL tablet Place 1 tablet (0.4 mg total) under the tongue every 5 (five) minutes x 3 doses as needed for chest pain.   omeprazole (PRILOSEC) 20 MG capsule Take 1 capsule (20 mg total) by mouth every other day.   potassium chloride (KLOR-CON) 10 MEQ tablet Take 1 tablet (10 mEq total) by mouth daily.   sodium chloride (OCEAN) 0.65 % SOLN nasal spray Place 1 spray into both nostrils as needed for congestion.   telmisartan (MICARDIS) 80 MG tablet Take 80 mg by mouth daily.   [DISCONTINUED] azithromycin (ZITHROMAX) 250 MG tablet Take 1 tablet (250 mg total) by mouth daily. Take first 2 tablets together, then 1 every day until finished.   [DISCONTINUED] budesonide (PULMICORT) 0.25 MG/2ML nebulizer solution Take 2 mLs by nebulization 2 (two) times daily.   [DISCONTINUED] predniSONE (DELTASONE) 10 MG tablet Take 30 mg by mouth daily.   No facility-administered encounter medications on file as of 09/21/2021.     Review of Systems  Review of Systems  No chest pain with exertion.  No orthopnea or PND.  Comprehensive review of systems otherwise negative. Physical Exam  BP 130/68 (BP Location: Left Arm, Patient Position: Sitting, Cuff Size: Normal)   Pulse 60   Ht '5\' 3"'$  (1.6 m)   Wt 261 lb 6.4 oz (118.6 kg)   SpO2 99%   BMI 46.30 kg/m   Wt Readings from Last 5 Encounters:  09/21/21 261 lb 6.4 oz (118.6 kg)  08/05/21 263 lb (119.3 kg)  03/23/21 264 lb (119.7 kg)  03/18/21 263 lb (119.3 kg)  12/16/20 251 lb 11.2 oz (114.2 kg)    BMI Readings from Last 5 Encounters:  09/21/21 46.30 kg/m  08/05/21 46.59 kg/m  03/23/21 46.77 kg/m  03/18/21 46.59 kg/m  12/16/20 44.59 kg/m     Physical Exam General: Well-appearing, no acute distress Eyes: EOMI, no icterus Neck: Supple, no JVP Pulmonary: Clear, no wheeze Cardiovascular: Regular rate and rhythm, no  murmur MSK: No synovitis, no joint effusion Abdomen: Nondistended, bowel sounds present Neuro: Normal gait, no weakness Psych: Normal mood, full affect   Assessment & Plan:   Wheeze, mild dyspnea on exertion: Following what sounds like viral URI with sore throat, ear pain, cough.  Cough ear pain sore throat is improved.  Mild residual wheeze.  Comes and goes.  Improved initially with prednisone course.  Seems mildly improved on low-dose budesonide neb.  After shared decision making, decided to increase budesonide to maximum dose 0.5 mg twice a day.  Continue albuterol as needed with spacer.  Encourage albuterol use as needed, she is hesitant to use this.  High suspicion for post viral reactive airways versus development of asthma.  Consider escalation ICS/LABA via HFA and spacer or addition of LABA nebulized to nebulized budesonide in the future if symptoms are failing to improve.   Return in about 3 months (around 12/22/2021).   Lanier Clam, MD 09/21/2021

## 2021-09-23 ENCOUNTER — Institutional Professional Consult (permissible substitution): Payer: Medicare Other | Admitting: Pulmonary Disease

## 2021-09-24 ENCOUNTER — Encounter: Payer: Self-pay | Admitting: Pulmonary Disease

## 2021-09-24 DIAGNOSIS — G4733 Obstructive sleep apnea (adult) (pediatric): Secondary | ICD-10-CM

## 2021-09-24 DIAGNOSIS — R062 Wheezing: Secondary | ICD-10-CM

## 2021-09-25 ENCOUNTER — Encounter: Payer: Self-pay | Admitting: Gynecologic Oncology

## 2021-09-28 ENCOUNTER — Other Ambulatory Visit: Payer: Self-pay

## 2021-09-28 ENCOUNTER — Encounter: Payer: Self-pay | Admitting: Pulmonary Disease

## 2021-09-28 ENCOUNTER — Inpatient Hospital Stay: Payer: Medicare Other | Attending: Gynecologic Oncology | Admitting: Gynecologic Oncology

## 2021-09-28 VITALS — BP 154/78 | HR 66 | Temp 98.9°F | Resp 16 | Ht 63.0 in | Wt 255.6 lb

## 2021-09-28 DIAGNOSIS — Z90722 Acquired absence of ovaries, bilateral: Secondary | ICD-10-CM | POA: Insufficient documentation

## 2021-09-28 DIAGNOSIS — Z8542 Personal history of malignant neoplasm of other parts of uterus: Secondary | ICD-10-CM | POA: Diagnosis present

## 2021-09-28 DIAGNOSIS — C541 Malignant neoplasm of endometrium: Secondary | ICD-10-CM

## 2021-09-28 DIAGNOSIS — Z90710 Acquired absence of both cervix and uterus: Secondary | ICD-10-CM | POA: Diagnosis not present

## 2021-09-28 DIAGNOSIS — G4733 Obstructive sleep apnea (adult) (pediatric): Secondary | ICD-10-CM

## 2021-09-28 DIAGNOSIS — R062 Wheezing: Secondary | ICD-10-CM

## 2021-09-28 MED ORDER — ALBUTEROL SULFATE HFA 108 (90 BASE) MCG/ACT IN AERS: 2.0000 | INHALATION_SPRAY | RESPIRATORY_TRACT | 6 refills | Status: DC | PRN

## 2021-10-01 DIAGNOSIS — J45909 Unspecified asthma, uncomplicated: Secondary | ICD-10-CM | POA: Diagnosis not present

## 2021-10-01 DIAGNOSIS — G4733 Obstructive sleep apnea (adult) (pediatric): Secondary | ICD-10-CM | POA: Diagnosis not present

## 2021-10-01 DIAGNOSIS — R062 Wheezing: Secondary | ICD-10-CM | POA: Diagnosis not present

## 2021-10-01 DIAGNOSIS — R0602 Shortness of breath: Secondary | ICD-10-CM | POA: Diagnosis not present

## 2021-10-31 DIAGNOSIS — G4733 Obstructive sleep apnea (adult) (pediatric): Secondary | ICD-10-CM | POA: Diagnosis not present

## 2021-11-26 DIAGNOSIS — E782 Mixed hyperlipidemia: Secondary | ICD-10-CM | POA: Diagnosis not present

## 2021-11-26 DIAGNOSIS — I700 Atherosclerosis of aorta: Secondary | ICD-10-CM | POA: Diagnosis not present

## 2021-11-26 DIAGNOSIS — N182 Chronic kidney disease, stage 2 (mild): Secondary | ICD-10-CM | POA: Diagnosis not present

## 2021-11-26 DIAGNOSIS — E1142 Type 2 diabetes mellitus with diabetic polyneuropathy: Secondary | ICD-10-CM | POA: Diagnosis not present

## 2021-11-26 DIAGNOSIS — I10 Essential (primary) hypertension: Secondary | ICD-10-CM | POA: Diagnosis not present

## 2021-12-01 DIAGNOSIS — G4733 Obstructive sleep apnea (adult) (pediatric): Secondary | ICD-10-CM | POA: Diagnosis not present

## 2021-12-02 DIAGNOSIS — I10 Essential (primary) hypertension: Secondary | ICD-10-CM | POA: Diagnosis not present

## 2021-12-02 DIAGNOSIS — D6869 Other thrombophilia: Secondary | ICD-10-CM | POA: Diagnosis not present

## 2021-12-02 DIAGNOSIS — M150 Primary generalized (osteo)arthritis: Secondary | ICD-10-CM | POA: Diagnosis not present

## 2021-12-02 DIAGNOSIS — G4733 Obstructive sleep apnea (adult) (pediatric): Secondary | ICD-10-CM | POA: Diagnosis not present

## 2021-12-02 DIAGNOSIS — I700 Atherosclerosis of aorta: Secondary | ICD-10-CM | POA: Diagnosis not present

## 2021-12-02 DIAGNOSIS — D6859 Other primary thrombophilia: Secondary | ICD-10-CM | POA: Diagnosis not present

## 2021-12-02 DIAGNOSIS — E782 Mixed hyperlipidemia: Secondary | ICD-10-CM | POA: Diagnosis not present

## 2021-12-02 DIAGNOSIS — D692 Other nonthrombocytopenic purpura: Secondary | ICD-10-CM | POA: Diagnosis not present

## 2021-12-02 DIAGNOSIS — N182 Chronic kidney disease, stage 2 (mild): Secondary | ICD-10-CM | POA: Diagnosis not present

## 2021-12-02 DIAGNOSIS — E1142 Type 2 diabetes mellitus with diabetic polyneuropathy: Secondary | ICD-10-CM | POA: Diagnosis not present

## 2021-12-09 ENCOUNTER — Telehealth: Payer: Self-pay | Admitting: Hematology

## 2021-12-09 NOTE — Telephone Encounter (Signed)
Scheduled per 9/6 in basket, message has been left

## 2021-12-11 ENCOUNTER — Telehealth: Payer: Self-pay | Admitting: Pulmonary Disease

## 2021-12-11 MED ORDER — PREDNISONE 10 MG PO TABS
ORAL_TABLET | ORAL | 0 refills | Status: AC
Start: 2021-12-11 — End: 2021-12-22

## 2021-12-11 NOTE — Telephone Encounter (Signed)
Pharmacy is [facility] on [address]. Please follow up with Katie to provide her an update. If and when the medication is sent

## 2021-12-11 NOTE — Telephone Encounter (Signed)
Rx for prednisone has been sent to pharmacy for pt. Called and spoke with pt's daughter letting her know that this was sent and she verbalized understanding. Nothing further needed.

## 2021-12-11 NOTE — Telephone Encounter (Signed)
Dr. Silas Flood, please advise if you are okay with [place] sending a pred taper to the pharmacy for pt to see if that would help with her symptoms. Seems like pharmacist of [facility] is recommending a pred taper.

## 2021-12-11 NOTE — Telephone Encounter (Signed)
Prednisone 40 mg for 5 days then 20 mg for 3 days, 10 mg for 3 days. Thanks!

## 2021-12-16 ENCOUNTER — Other Ambulatory Visit: Payer: Medicare Other

## 2021-12-16 ENCOUNTER — Ambulatory Visit: Payer: Medicare Other | Admitting: Hematology

## 2021-12-17 NOTE — Progress Notes (Unsigned)
Shirley [name] OFFICE PROGRESS NOTE  [address], [place] [address]  DIAGNOSIS:  Follow-up recurrent DVT and h/o GI bleed on anticoagulation  Oncology History Overview Note  IHC MMR intact MSI stable    Endometrial cancer ([place])  02/26/2019 Initial Biopsy   Endometrial biopsy revealed grade 1 endometrioid adenocarcinoma   02/26/2019 Initial Diagnosis   Endometrial cancer ([place])   03/27/2019 Imaging   CT abdomen and pelvis: Diffuse endometrial thickening, consistent with known endometrial carcinoma.  No evidence of metastatic disease within the abdomen or pelvis.  Several small uterine fibroids largest measuring 4 cm.   03/28/2019 Surgery   D&C: Pathology shows endometrioid carcinoma, FIGO grade 1-2   03/28/2019 Treatment Plan Change   Mirena IUD inserted   07/02/2019 Pathology Results   EMB: focal Gr1 endometrioid adenocarcinoma Stromal changes c/w hormone effect   09/11/2019 Surgery   TRH/BSO, SLN  On EUA, 8-10cm uterus, mobile. On intra-abdominal entry, normal upper abdominal survey. Normal appearing small and large bowel, omentum. Normal appearing bilateral adnexa. Uterus and bulbous, especially at the fundus, with several 1-2cm intra-mural fibroids. Mapping successful to bilateral obturator nodes. IUD noted within the uterus after specimen removal. NO intra-abdominal or pelvic evidence of disease.   09/11/2019 Pathology Results   IA, grade 1 endometrioid, no MI, negative SLNs, no LVSI   09/11/2019 Cancer Staging   Staging form: Corpus Uteri - Carcinoma and Carcinosarcoma, AJCC 8th Edition - Clinical stage from 09/11/2019: FIGO Stage IA (cT1a, cN0, cM0) - Signed by Lafonda Mosses, MD on 09/17/2019     CURRENT THERAPY: Eliquis 2.5 mg twice daily, has been taking 2.5 daily for past 6 months   INTERVAL HISTORY: Shirley [name] 81 y.o. female returns to the clinic today for a follow up visit.  She was last seen  in the clinic 1 year ago by my colleague Lacie.  The patient is being followed for history of DVT.  She is currently on Eliquis and aspirin.  She reports she is doing fairly well overall although she has some underlying lung disease for which she follows closely with pulmonology. She is currently undergoing a course of prednisone under the direction of Dr. Silas Flood. Denies symptoms of infection at this time including URI, skin infections, diarrhea, or dysuria. She also follows closely with cardiology regarding hypertension and chronic lower extremity swelling.  She also is up-to-date with her appointments with Dr. Berline Lopes regarding her history of endometrial cancer.  She most recently saw Dr. Berline Lopes on 09/28/21. The patient is tolerating her Eliquis without any abnormal bleeding or bruising.  She denies any epistaxis, gingival bleeding, hemoptysis, hematemesis, melena, or hematochezia.  She denies any calf pain.  She has chronic lower extremity swelling the left greater than the right at baseline.  This is unchanged but she does have some overlying skin changes related to venous stasis dermatitis. She sees her cardiologist next month. She is  The patient denies any recent fever, chills, night sweats, or unexplained weight loss.  She denies any chest discomfort.  She is here today for evaluation and repeat blood work.    MEDICAL HISTORY: Past Medical History:  Diagnosis Date   BMI 40.0-44.9, adult ([place])    CAD in native artery, stent to RCA BMS with MI 2015 11/04/2003   a. inferior STEMI 01/2014 s/p BMS to prox RCA.   DVT (deep venous thrombosis) ([place]) 2003   lower extremity DVT with questionable recurrence in 2005. Another  admission 2015.   Endometrial cancer ([place])    Endometrial thickening on ultra sound 07/2009   path showing Fragments of benign endocervical and squamous mucosa. No dysplasia or malignancy // Followed by Dr. [name]   First degree AV block    Gastric ulcer    GERD  (gastroesophageal reflux disease)    GI bleed    Hemorrhoid    Hyperlipidemia LDL goal <70    Hypertension    Insomnia 11/02/2010   Memory difficulties 11/02/2010   Mild dilation of ascending aorta ([place])    Morbid obesity ([place]) 09/16/2012   Obstructive sleep apnea 09/23/2010   Pre-diabetes    Pulmonary nodule    ST elevation myocardial infarction (STEMI) of inferior wall ([place]) 01/24/2014   Stroke ([place]) 2015   Syncope 12/18/2013   Syncope, near 12/14/2018   TIA (transient ischemic attack) 09/23/2010   09/13/2010: CT, MRI/MRA no acute process. Carotid doppler: no stenosis. 2D-Echo Normal LV function with an EF 55-60%.    Trifascicular block    UTI (urinary tract infection) 01/27/2014    ALLERGIES:  is allergic to iohexol and hydrochlorothiazide.  MEDICATIONS:  Current Outpatient Medications  Medication Sig Dispense Refill   acetaminophen (TYLENOL) 500 MG tablet Take 500 mg by mouth 2 (two) times daily as needed (for pain).     albuterol (PROVENTIL) (2.5 MG/3ML) 0.083% nebulizer solution SMARTSIG:3 Milliliter(s) Via Inhaler 4 Times Daily PRN     albuterol (VENTOLIN HFA) 108 (90 Base) MCG/ACT inhaler Inhale 2 puffs into the lungs every 4 (four) hours as needed for wheezing or shortness of breath. 8 g 6   amLODipine (NORVASC) 10 MG tablet Take 10 mg by mouth daily.     apixaban (ELIQUIS) 2.5 MG TABS tablet Take 1 tablet (2.5 mg total) by mouth 2 (two) times daily. 60 tablet 5   aspirin EC 81 MG tablet Swallow whole. Take one tablet 3 times week. 90 tablet 3   atorvastatin (LIPITOR) 80 MG tablet TAKE 1 TABLET BY MOUTH EVERY DAY 90 tablet 1   budesonide (PULMICORT) 0.5 MG/2ML nebulizer solution Take 2 mLs (0.5 mg total) by nebulization 2 (two) times daily. 120 mL 6   busPIRone (BUSPAR) 7.5 MG tablet Take 7.5 mg by mouth 2 (two) times daily.     cholecalciferol (VITAMIN D3) 25 MCG (1000 UNIT) tablet Take 1,000 Units by mouth daily.     Ferrous Gluconate 239 (27 Fe) MG TABS Take by mouth.      furosemide (LASIX) 20 MG tablet Take 1 tablet (20 mg total) by mouth daily. 90 tablet 1   ibuprofen (ADVIL) 200 MG tablet Take 200 mg by mouth every 6 (six) hours as needed for headache or moderate pain.     lisinopril (ZESTRIL) 40 MG tablet Take 40 mg by mouth daily.     metoprolol succinate (TOPROL-XL) 25 MG 24 hr tablet TAKE 1 TABLET (25 MG TOTAL) BY MOUTH IN THE MORNING AND AT BEDTIME. 180 tablet 1   Multiple Vitamin (MULTIVITAMIN WITH MINERALS) TABS Take 1 tablet by mouth every morning.     Naphazoline HCl (CLEAR EYES OP) Place 1 drop into both eyes as needed (for irritation or dryness).      nitroGLYCERIN (NITROSTAT) 0.4 MG SL tablet Place 1 tablet (0.4 mg total) under the tongue every 5 (five) minutes x 3 doses as needed for chest pain. 25 tablet 3   omeprazole (PRILOSEC) 20 MG capsule Take 1 capsule (20 mg total) by mouth every other day. 45 capsule  3   potassium chloride (KLOR-CON) 10 MEQ tablet Take 1 tablet (10 mEq total) by mouth daily. 90 tablet 1   predniSONE (DELTASONE) 10 MG tablet Take 4 tablets (40 mg total) by mouth daily with breakfast for 5 days, THEN 2 tablets (20 mg total) daily with breakfast for 3 days, THEN 1 tablet (10 mg total) daily with breakfast for 3 days. 29 tablet 0   Pseudoeph-Bromphen-DM (BROMFED DM PO) Take 5 mLs by mouth 3 (three) times daily.     sodium chloride (OCEAN) 0.65 % SOLN nasal spray Place 1 spray into both nostrils as needed for congestion.     telmisartan (MICARDIS) 80 MG tablet Take 80 mg by mouth daily.     No current facility-administered medications for this visit.    SURGICAL HISTORY:  Past Surgical History:  Procedure Laterality Date   COLONOSCOPY W/ POLYPECTOMY  11/01/2007   8 mm rectal adenoma, hemorrhoids   CORONARY ANGIOPLASTY WITH STENT PLACEMENT  01/24/14   Inf. STEMI, BMS to RCA   [place] N/A 03/28/2019   Procedure: DILATATION AND CURETTAGE OF UTERUS;  Surgeon: Lafonda Mosses, MD;  Location: [facility];   Service: Gynecology;  Laterality: N/A;   ESOPHAGOGASTRODUODENOSCOPY N/A 10/06/2018   Procedure: ESOPHAGOGASTRODUODENOSCOPY (EGD);  Surgeon: Doran Stabler, MD;  Location: [facility];  Service: Gastroenterology;  Laterality: N/A;   HOT HEMOSTASIS N/A 10/06/2018   Procedure: HOT HEMOSTASIS (ARGON PLASMA COAGULATION/BICAP);  Surgeon: Doran Stabler, MD;  Location: [facility];  Service: Gastroenterology;  Laterality: N/A;   INTRAUTERINE DEVICE (IUD) INSERTION N/A 03/28/2019   Procedure: INTRAUTERINE DEVICE (IUD) INSERTION;  Surgeon: Lafonda Mosses, MD;  Location: [facility];  Service: Gynecology;  Laterality: N/A;   KNEE SURGERY  1996   Left knee arthroscopy.   LEFT HEART CATHETERIZATION WITH CORONARY ANGIOGRAM N/A 01/24/2014   Procedure: LEFT HEART CATHETERIZATION WITH CORONARY ANGIOGRAM;  Surgeon: Burnell Blanks, MD;  Location: [facility] CATH LAB;  Service: Cardiovascular;  Laterality: N/A;   ROBOTIC ASSISTED TOTAL HYSTERECTOMY WITH BILATERAL SALPINGO OOPHERECTOMY N/A 09/11/2019   Procedure: XI ROBOTIC ASSISTED TOTAL HYSTERECTOMY WITH BILATERAL SALPINGO OOPHORECTOMY, LYMPH NODE DISSECTION;  Surgeon: Lafonda Mosses, MD;  Location: [facility];  Service: Gynecology;  Laterality: N/A;   ROTATOR CUFF REPAIR     right.   SCLEROTHERAPY  10/06/2018   Procedure: SCLEROTHERAPY;  Surgeon: Doran Stabler, MD;  Location: [facility] ENDOSCOPY;  Service: Gastroenterology;;   SENTINEL NODE BIOPSY N/A 09/11/2019   Procedure: SENTINEL NODE BIOPSY;  Surgeon: Lafonda Mosses, MD;  Location: [facility];  Service: Gynecology;  Laterality: N/A;   TUBAL LIGATION      REVIEW OF SYSTEMS:   Review of Systems  Constitutional: Negative for appetite change, chills, fatigue, fever and unexpected weight change.  HENT: Negative for mouth sores, nosebleeds, sore throat and trouble swallowing.   Eyes: Negative for eye problems and icterus.  Respiratory: Positive for intermittent wheezing and dyspnea.  Negative for   hemoptysis.   Cardiovascular: Negative for chest pain. Positive from chronic leg swelling (L>R) Gastrointestinal: Negative for abdominal pain, constipation, diarrhea, nausea and vomiting.  Genitourinary: Negative for bladder incontinence, difficulty urinating, dysuria, frequency and hematuria.   Musculoskeletal: Negative for back pain, gait problem, neck pain and neck stiffness.  Skin: Positive for venous statis dermatitis. Negative for itching and rash.  Neurological: Negative for dizziness, extremity weakness, gait problem, headaches, light-headedness and seizures.  Hematological: Negative for adenopathy. Does not bruise/bleed easily.  Psychiatric/Behavioral:  Negative for confusion, depression and sleep disturbance. The patient is not nervous/anxious.     PHYSICAL EXAMINATION:  Blood pressure (!) 160/69, pulse (!) 58, temperature 97.7 F (36.5 C), temperature source Temporal, resp. rate 16, weight 252 lb 4.8 oz (114.4 kg), SpO2 97 %.  ECOG PERFORMANCE STATUS: 2-3  Physical Exam  Constitutional: Oriented to person, place, and time and well-developed, well-nourished, and in no distress. No distress.  HENT:  Head: Normocephalic and atraumatic.  Mouth/Throat: Oropharynx is clear and moist. No oropharyngeal exudate.  Eyes: Conjunctivae are normal. Right eye exhibits no discharge. Left eye exhibits no discharge. No scleral icterus.  Neck: Normal range of motion. Neck supple.  Cardiovascular: Normal rate, regular rhythm, normal heart sounds and intact distal pulses.   Pulmonary/Chest: Effort normal and breath sounds normal. No respiratory distress. No wheezes. No rales.  Abdominal: Soft. Bowel sounds are normal. Exhibits no distension and no mass. There is no tenderness.  Musculoskeletal: Normal range of motion.HEART: regular rate & rhythm, moderate lower leg edema  L>R with venous stasis changes.  2 small blisters to left anterior lower leg Lymphadenopathy:    No cervical adenopathy.   Neurological: Alert and oriented to person, place, and time. Exhibits normal muscle wasting. Ambulates with a cane. Skin: Skin is warm and dry. No rash noted. Not diaphoretic. No erythema. No pallor.  Psychiatric: Mood, memory and judgment normal.  Vitals reviewed.  LABORATORY DATA: Lab Results  Component Value Date   WBC 13.0 (H) 12/18/2021   HGB 14.1 12/18/2021   HCT 41.9 12/18/2021   MCV 85.5 12/18/2021   PLT 212 12/18/2021      Chemistry      Component Value Date/Time   NA 137 12/18/2021 1017   NA 137 07/09/2020 0831   NA 137 12/13/2014 0857   K 3.9 12/18/2021 1017   K 3.8 12/13/2014 0857   CL 102 12/18/2021 1017   CO2 29 12/18/2021 1017   CO2 28 12/13/2014 0857   BUN 15 12/18/2021 1017   BUN 12 07/09/2020 0831   BUN 10.6 12/13/2014 0857   CREATININE 0.68 12/18/2021 1017   CREATININE 0.7 12/13/2014 0857      Component Value Date/Time   CALCIUM 8.9 12/18/2021 1017   CALCIUM 8.8 12/13/2014 0857   ALKPHOS 107 12/18/2021 1017   ALKPHOS 131 12/13/2014 0857   AST 20 12/18/2021 1017   AST 16 12/13/2014 0857   ALT 29 12/18/2021 1017   ALT 20 12/13/2014 0857   BILITOT 1.1 12/18/2021 1017   BILITOT 0.51 12/13/2014 0857       RADIOGRAPHIC STUDIES:  No results found.   ASSESSMENT/PLAN:    1. Recurrent DVT and GI bleed on [facility] -She had recurrent DVT while on anticoagulation, previously on Xarelto, and most recently developed large upper GI bleed while on Coumadin and aspirin 81 mg.  -Her bleeding ulcer resolved, started prophylactic dose of Eliquis 2.5 mg BID 12/2018 -Tolerating well but has been taking 2.5 mg daily rather than twice daily for past 6 months -No signs of recurrent thrombosis or bleeding -Continue AC indefinitely -Labs and follow up in 1 year   2. HTN CAD, history of recent MI and stroke, and pulmonary disease -On multidrug regimen, followed by PCP Dr. Ashby Dawes and cardiology Dr. [name] -Sees Dr. Silas Flood from pulmonary medicine. On  prednisone. WBC is 13.0. She denies signs of infection. Elevated WBC likely from prednisone.   3. Obesity, sedentary lifestyle, knee pain -Ambulates with a cane -Stable   4.  Endometrial endometrioid adenocarcinoma, FIGO grade 1, pT1aN0 M0 -She underwent D&C and Mirena IUD for progesterone therapy due to abnormal vaginal bleeding.  Path showed endometrioid carcinoma, FIGO grade 1-2, confirmed on biopsy 07/02/2019 -S/p total hysterectomy with BSO 09/11/2019 by Dr. Gaylyn Rong and our GYN oncology clinic -Due to low-grade favorable histology and very early stage disease, there was no benefit of adjuvant chemotherapy was not recommended -Continue surveillance with Dr. Berline Lopes, she last saw Dr. Berline Lopes on 09/28/21.   Plan:  -Continue on Eliquis -Labs and follow up in 1 year   No orders of the defined types were placed in this encounter.    The total time spent in the appointment was 20-29 minutes.   Shirley Mckeithan L Heer Justiss, PA-C 12/18/21

## 2021-12-18 ENCOUNTER — Encounter: Payer: Self-pay | Admitting: Physician Assistant

## 2021-12-18 ENCOUNTER — Other Ambulatory Visit: Payer: Self-pay

## 2021-12-18 ENCOUNTER — Inpatient Hospital Stay: Payer: Medicare Other | Attending: Physician Assistant

## 2021-12-18 ENCOUNTER — Inpatient Hospital Stay (HOSPITAL_BASED_OUTPATIENT_CLINIC_OR_DEPARTMENT_OTHER): Payer: Medicare Other | Admitting: Physician Assistant

## 2021-12-18 VITALS — BP 160/69 | HR 58 | Temp 97.7°F | Resp 16 | Wt 252.3 lb

## 2021-12-18 DIAGNOSIS — Z79899 Other long term (current) drug therapy: Secondary | ICD-10-CM | POA: Insufficient documentation

## 2021-12-18 DIAGNOSIS — Z86718 Personal history of other venous thrombosis and embolism: Secondary | ICD-10-CM | POA: Diagnosis not present

## 2021-12-18 DIAGNOSIS — Z7901 Long term (current) use of anticoagulants: Secondary | ICD-10-CM | POA: Insufficient documentation

## 2021-12-18 DIAGNOSIS — E669 Obesity, unspecified: Secondary | ICD-10-CM | POA: Diagnosis not present

## 2021-12-18 DIAGNOSIS — Z8542 Personal history of malignant neoplasm of other parts of uterus: Secondary | ICD-10-CM | POA: Insufficient documentation

## 2021-12-18 DIAGNOSIS — I82409 Acute embolism and thrombosis of unspecified deep veins of unspecified lower extremity: Secondary | ICD-10-CM | POA: Diagnosis not present

## 2021-12-18 DIAGNOSIS — I10 Essential (primary) hypertension: Secondary | ICD-10-CM | POA: Insufficient documentation

## 2021-12-18 DIAGNOSIS — Z8673 Personal history of transient ischemic attack (TIA), and cerebral infarction without residual deficits: Secondary | ICD-10-CM | POA: Diagnosis not present

## 2021-12-18 DIAGNOSIS — Z90722 Acquired absence of ovaries, bilateral: Secondary | ICD-10-CM | POA: Diagnosis not present

## 2021-12-18 DIAGNOSIS — I2510 Atherosclerotic heart disease of native coronary artery without angina pectoris: Secondary | ICD-10-CM | POA: Diagnosis not present

## 2021-12-18 DIAGNOSIS — Z7982 Long term (current) use of aspirin: Secondary | ICD-10-CM | POA: Diagnosis not present

## 2021-12-18 DIAGNOSIS — I252 Old myocardial infarction: Secondary | ICD-10-CM | POA: Insufficient documentation

## 2021-12-18 LAB — CBC WITH DIFFERENTIAL (CANCER CENTER ONLY)
Abs Immature Granulocytes: 0.05 10*3/uL (ref 0.00–0.07)
Basophils Absolute: 0.1 10*3/uL (ref 0.0–0.1)
Basophils Relative: 1 %
Eosinophils Absolute: 0.3 10*3/uL (ref 0.0–0.5)
Eosinophils Relative: 2 %
HCT: 41.9 % (ref 36.0–46.0)
Hemoglobin: 14.1 g/dL (ref 12.0–15.0)
Immature Granulocytes: 0 %
Lymphocytes Relative: 22 %
Lymphs Abs: 2.8 10*3/uL (ref 0.7–4.0)
MCH: 28.8 pg (ref 26.0–34.0)
MCHC: 33.7 g/dL (ref 30.0–36.0)
MCV: 85.5 fL (ref 80.0–100.0)
Monocytes Absolute: 0.9 10*3/uL (ref 0.1–1.0)
Monocytes Relative: 7 %
Neutro Abs: 8.9 10*3/uL — ABNORMAL HIGH (ref 1.7–7.7)
Neutrophils Relative %: 68 %
Platelet Count: 212 10*3/uL (ref 150–400)
RBC: 4.9 MIL/uL (ref 3.87–5.11)
RDW: 13.5 % (ref 11.5–15.5)
WBC Count: 13 10*3/uL — ABNORMAL HIGH (ref 4.0–10.5)
nRBC: 0 % (ref 0.0–0.2)

## 2021-12-18 LAB — CMP (CANCER CENTER ONLY)
ALT: 29 U/L (ref 0–44)
AST: 20 U/L (ref 15–41)
Albumin: 3.5 g/dL (ref 3.5–5.0)
Alkaline Phosphatase: 107 U/L (ref 38–126)
Anion gap: 6 (ref 5–15)
BUN: 15 mg/dL (ref 8–23)
CO2: 29 mmol/L (ref 22–32)
Calcium: 8.9 mg/dL (ref 8.9–10.3)
Chloride: 102 mmol/L (ref 98–111)
Creatinine: 0.68 mg/dL (ref 0.44–1.00)
GFR, Estimated: >60 mL/min (ref >60)
Glucose, Bld: 146 mg/dL — ABNORMAL HIGH (ref 70–99)
Potassium: 3.9 mmol/L (ref 3.5–5.1)
Sodium: 137 mmol/L (ref 135–145)
Total Bilirubin: 1.1 mg/dL (ref 0.3–1.2)
Total Protein: 6.7 g/dL (ref 6.5–8.1)

## 2021-12-22 ENCOUNTER — Encounter: Payer: Self-pay | Admitting: Pulmonary Disease

## 2021-12-22 ENCOUNTER — Ambulatory Visit (INDEPENDENT_AMBULATORY_CARE_PROVIDER_SITE_OTHER): Payer: Medicare Other | Admitting: Pulmonary Disease

## 2021-12-22 VITALS — BP 128/72 | HR 63 | Ht 63.0 in | Wt 254.4 lb

## 2021-12-22 DIAGNOSIS — J4540 Moderate persistent asthma, uncomplicated: Secondary | ICD-10-CM | POA: Diagnosis not present

## 2021-12-22 DIAGNOSIS — R0609 Other forms of dyspnea: Secondary | ICD-10-CM | POA: Diagnosis not present

## 2021-12-22 MED ORDER — ARFORMOTEROL TARTRATE 15 MCG/2ML IN NEBU: 15.0000 ug | INHALATION_SOLUTION | Freq: Two times a day (BID) | RESPIRATORY_TRACT | 3 refills | Status: DC

## 2021-12-22 MED ORDER — ALBUTEROL SULFATE (2.5 MG/3ML) 0.083% IN NEBU: INHALATION_SOLUTION | RESPIRATORY_TRACT | 4 refills | Status: DC

## 2021-12-22 NOTE — Addendum Note (Signed)
Addended by: Retia Passe on: 12/22/2021 09:39 AM   Modules accepted: Orders

## 2021-12-22 NOTE — Patient Instructions (Signed)
Nice to see you again  To help with your symptoms but add a medicine called arformoterol.  This is a nebulizer.  Twice a day.  Like your budesonide.  You can mix the arformoterol and budesonide and do them at the same time if you like or you can do them separate.  Let see if this helps, I would use it for at least a month.  To further evaluate your symptoms, I have ordered pulmonary function test.  We will schedule these next available if that is okay with you.  Discuss your shortness of breath with your cardiologist at your upcoming office visit.  Return to clinic in 3 months or sooner as needed with Dr. Silas Flood

## 2021-12-22 NOTE — Progress Notes (Signed)
$'@Patient'O$  ID: Shirley [name], female    DOB: 04-21-1940, 81 y.o.   MRN: [id number]  Chief Complaint  Patient presents with   Follow-up    Pt is here for follow up for wheeze. Pt states that some days are good and some days are bad. Pt is worried about fluid around heart and has appt soon with cardio. Pt is on albuterol as needed and pulmoicrt nebs. No change noted with prednisone    Referring provider: Merrilee Seashore, MD  HPI:   81 y.o. woman whom are seen in follow up  for evaluation of wheeze, mild dyspnea on exertion felt to be related to asthma given temporary improvement with ICS and prednisone.  Most recent oncology note reviewed.  Overall, cough essentially resolved.  Still with wheeze.  Still occurs sometimes at night but largely gone.  She is on budesonide high-dose neb.  Recently got a course of prednisone for worsening shortness of breath.  This did not help significantly.  Still with dyspnea with minimal exertion.  She complains of chronic left-sided chest pain.  For 6 years.  Started after resuscitation event in the hospital many years ago.  She has clear chest imaging in the interim.  Suspect this is MSK in nature, potentially nerve damage from cracked ribs etc.  HPI at initial visit: Patient was in usual health.  Developed ear pain, sore throat, runny nose, cough, chest tightness and wheeze.  This was April 2023.  Got some antibiotics.  Mild improvement.  Seen by PCP.  Given prednisone and additional antibiotics.  Prednisone helped immensely.  Sore throat, ear pain, cough largely improved.  Mild residual cough.  Wheezes main thing that persist.  No time of day when things are better or worse.  No position make things better or worse.  No seasonal environmental factors she can identify to make things better or worse.  Does not use albuterol afraid of how will make her feel.  Prescribe budesonide nebulized 0.25 mg twice daily.  Feels this does help a bit.  Helps with  decreasing the frequency of wheeze.  She denies significant allergy symptoms except for the spring.  No recurrent bronchitis.  No prior history of asthma.  No other respiratory illness.  She had chest x-ray 08/2021 that on my review interpretation reveals clear lungs bilaterally.  Most recent cross-sectional imaging CTA chest 10/2019 reviewed interpreted as clear lungs bilaterally.  PMH: OSA on CPAP hypertension, Surgical history: Tubal ligation, total hysterectomy and oophorectomy, D&C of uterus, rotator cuff repair Family history: Mother with CAD, hyperlipidemia, diabetes, Father with CAD Social history: Never smoker, lives in [place] / Pulmonary Flowsheets:   ACT:      No data to display          MMRC:     No data to display          Epworth:      No data to display          Tests:   FENO:  No results found for: "NITRICOXIDE"  PFT:     No data to display          WALK:      No data to display          Imaging: Personally reviewed and as per EMR discussion this note No results found.  Lab Results: Personally reviewed CBC    Component Value Date/Time   WBC 13.0 (H) 12/18/2021 1017   WBC  11.5 (H) 08/03/2021 1826   RBC 4.90 12/18/2021 1017   HGB 14.1 12/18/2021 1017   HGB 13.3 10/17/2019 0914   HGB 12.4 12/13/2014 0857   HCT 41.9 12/18/2021 1017   HCT 41.2 10/17/2019 0914   HCT 37.4 12/13/2014 0857   PLT 212 12/18/2021 1017   PLT 267 10/17/2019 0914   MCV 85.5 12/18/2021 1017   MCV 86 10/17/2019 0914   MCV 84.2 12/13/2014 0857   MCH 28.8 12/18/2021 1017   MCHC 33.7 12/18/2021 1017   RDW 13.5 12/18/2021 1017   RDW 13.1 10/17/2019 0914   RDW 14.7 (H) 12/13/2014 0857   LYMPHSABS 2.8 12/18/2021 1017   LYMPHSABS 6.3 (H) 12/13/2014 0857   MONOABS 0.9 12/18/2021 1017   MONOABS 0.7 12/13/2014 0857   EOSABS 0.3 12/18/2021 1017   EOSABS 0.4 12/13/2014 0857   BASOSABS 0.1 12/18/2021 1017   BASOSABS 0.1 12/13/2014 0857     BMET    Component Value Date/Time   NA 137 12/18/2021 1017   NA 137 07/09/2020 0831   NA 137 12/13/2014 0857   K 3.9 12/18/2021 1017   K 3.8 12/13/2014 0857   CL 102 12/18/2021 1017   CO2 29 12/18/2021 1017   CO2 28 12/13/2014 0857   GLUCOSE 146 (H) 12/18/2021 1017   GLUCOSE 98 12/13/2014 0857   BUN 15 12/18/2021 1017   BUN 12 07/09/2020 0831   BUN 10.6 12/13/2014 0857   CREATININE 0.68 12/18/2021 1017   CREATININE 0.7 12/13/2014 0857   CALCIUM 8.9 12/18/2021 1017   CALCIUM 8.8 12/13/2014 0857   GFRNONAA >60 12/18/2021 1017   GFRAA >60 12/06/2019 0923    BNP    Component Value Date/Time   BNP 147.7 (H) 08/03/2021 1826    ProBNP    Component Value Date/Time   PROBNP 64 07/09/2020 0831   PROBNP 64.6 09/15/2012 2345    Specialty Problems       Pulmonary Problems   Obstructive sleep apnea    Allergies  Allergen Reactions   Iohexol Rash and Other (See Comments)    Patient stated a rash appeared after a CT scan    Hydrochlorothiazide Other (See Comments)    12/18/21: Palpitations and SOB per the pt - AS    Immunization History  Administered Date(s) Administered   Influenza, High Dose Seasonal PF 12/26/2014, 12/15/2015   Influenza, Quadrivalent, Recombinant, Inj, Pf 01/06/2017, 01/04/2019, 01/11/2020, 01/16/2021   Influenza,inj,Quad PF,6+ Mos 12/20/2013   Influenza-Unspecified 01/04/2012, 12/04/2016, 12/05/2018   PFIZER Comirnaty(Gray Top)Covid-19 Tri-Sucrose Vaccine 08/15/2020   PFIZER(Purple Top)SARS-COV-2 Vaccination 04/17/2019, 05/08/2019, 12/03/2019, 12/04/2019   PNEUMOCOCCAL CONJUGATE-20 05/06/2021   Pfizer Covid-19 Vaccine Bivalent Booster 59yr & up 01/07/2021   Pneumococcal Conjugate-13 02/10/2015   Pneumococcal Polysaccharide-23 11/02/2010, 01/04/2012   Tdap 11/02/2010    Past Medical History:  Diagnosis Date   BMI 40.0-44.9, adult ([facility]    CAD in native artery, stent to RCA BMS with MI 2015 11/04/2003   a. inferior STEMI 01/2014 s/p BMS  to prox RCA.   DVT (deep venous thrombosis) ([facility] 2003   lower extremity DVT with questionable recurrence in 2005. Another admission 2015.   [facility]    Endometrial thickening on ultra sound 07/2009   path showing Fragments of benign endocervical and squamous mucosa. No dysplasia or malignancy // Followed by Dr. AGus Height  First degree AV block    Gastric ulcer    GERD (gastroesophageal reflux disease)    GI bleed    Hemorrhoid  Hyperlipidemia LDL goal <70    Hypertension    Insomnia 11/02/2010   Memory difficulties 11/02/2010   Mild dilation of ascending aorta ([facility])    Morbid obesity ([facility]) 09/16/2012   Obstructive sleep apnea 09/23/2010   Pre-diabetes    Pulmonary nodule    ST elevation myocardial infarction (STEMI) of inferior wall ([facility]) 01/24/2014   Stroke ([facility]) 2015   Syncope 12/18/2013   Syncope, near 12/14/2018   TIA (transient ischemic attack) 09/23/2010   09/13/2010: CT, MRI/MRA no acute process. Carotid doppler: no stenosis. 2D-Echo Normal LV function with an EF 55-60%.    Trifascicular block    UTI (urinary tract infection) 01/27/2014    Tobacco History: Social History   Tobacco Use  Smoking Status Never  Smokeless Tobacco Never   Counseling given: Not Answered   Continue to not smoke  Outpatient Encounter Medications as of 12/22/2021  Medication Sig   acetaminophen (TYLENOL) 500 MG tablet Take 500 mg by mouth 2 (two) times daily as needed (for pain).   albuterol (PROVENTIL) (2.5 MG/3ML) 0.083% nebulizer solution SMARTSIG:3 Milliliter(s) Via Inhaler 4 Times Daily PRN   albuterol (VENTOLIN HFA) 108 (90 Base) MCG/ACT inhaler Inhale 2 puffs into the lungs every 4 (four) hours as needed for wheezing or shortness of breath.   amLODipine (NORVASC) 10 MG tablet Take 10 mg by mouth daily.   apixaban (ELIQUIS) 2.5 MG TABS tablet Take 1 tablet (2.5 mg total) by mouth 2 (two) times daily.   arformoterol (BROVANA) 15 MCG/2ML NEBU Take 2 mLs (15 mcg total)  by nebulization 2 (two) times daily.   aspirin EC 81 MG tablet Swallow whole. Take one tablet 3 times week.   atorvastatin (LIPITOR) 80 MG tablet TAKE 1 TABLET BY MOUTH EVERY DAY   budesonide (PULMICORT) 0.5 MG/2ML nebulizer solution Take 2 mLs (0.5 mg total) by nebulization 2 (two) times daily.   busPIRone (BUSPAR) 7.5 MG tablet Take 7.5 mg by mouth 2 (two) times daily.   cholecalciferol (VITAMIN D3) 25 MCG (1000 UNIT) tablet Take 1,000 Units by mouth daily.   Ferrous Gluconate 239 (27 Fe) MG TABS Take by mouth.   furosemide (LASIX) 20 MG tablet Take 1 tablet (20 mg total) by mouth daily.   ibuprofen (ADVIL) 200 MG tablet Take 200 mg by mouth every 6 (six) hours as needed for headache or moderate pain.   lisinopril (ZESTRIL) 40 MG tablet Take 40 mg by mouth daily.   metoprolol succinate (TOPROL-XL) 25 MG 24 hr tablet TAKE 1 TABLET (25 MG TOTAL) BY MOUTH IN THE MORNING AND AT BEDTIME.   Multiple Vitamin (MULTIVITAMIN WITH MINERALS) TABS Take 1 tablet by mouth every morning.   Naphazoline HCl (CLEAR EYES OP) Place 1 drop into both eyes as needed (for irritation or dryness).    nitroGLYCERIN (NITROSTAT) 0.4 MG SL tablet Place 1 tablet (0.4 mg total) under the tongue every 5 (five) minutes x 3 doses as needed for chest pain.   omeprazole (PRILOSEC) 20 MG capsule Take 1 capsule (20 mg total) by mouth every other day.   potassium chloride (KLOR-CON) 10 MEQ tablet Take 1 tablet (10 mEq total) by mouth daily.   predniSONE (DELTASONE) 10 MG tablet Take 4 tablets (40 mg total) by mouth daily with breakfast for 5 days, THEN 2 tablets (20 mg total) daily with breakfast for 3 days, THEN 1 tablet (10 mg total) daily with breakfast for 3 days.   Pseudoeph-Bromphen-DM (BROMFED DM PO) Take 5 mLs by mouth 3 (three) times  daily.   sodium chloride (OCEAN) 0.65 % SOLN nasal spray Place 1 spray into both nostrils as needed for congestion.   telmisartan (MICARDIS) 80 MG tablet Take 80 mg by mouth daily.   No  facility-administered encounter medications on file as of 12/22/2021.     Review of Systems  Review of Systems  N/a Physical Exam  BP 128/72 (BP Location: Left Arm, Patient Position: Sitting, Cuff Size: Normal)   Pulse 63   Ht '5\' 3"'$  (1.6 m)   Wt 254 lb 6.4 oz (115.4 kg)   SpO2 98%   BMI 45.06 kg/m   Wt Readings from Last 5 Encounters:  12/22/21 254 lb 6.4 oz (115.4 kg)  12/18/21 252 lb 4.8 oz (114.4 kg)  09/28/21 255 lb 9.6 oz (115.9 kg)  09/21/21 261 lb 6.4 oz (118.6 kg)  08/05/21 263 lb (119.3 kg)    BMI Readings from Last 5 Encounters:  12/22/21 45.06 kg/m  12/18/21 44.69 kg/m  09/28/21 45.28 kg/m  09/21/21 46.30 kg/m  08/05/21 46.59 kg/m     Physical Exam General: Well-appearing, no acute distress Eyes: EOMI, no icterus Neck: Supple, no JVP Pulmonary: Clear, no wheeze Cardiovascular: Regular rate and rhythm, no murmur MSK: No synovitis, no joint effusion Abdomen: Nondistended, bowel sounds present Neuro: Normal gait, no weakness Psych: Normal mood, full affect   Assessment & Plan:   Dyspnea on exertion: Mildly improved but not significantly with asthma therapies.  Recent prednisone without improvement.  Add LABA via nebulizer as below.  PFTs for further evaluation.  Asked to discuss with her cardiologist at upcoming appointment.  High suspicion for element of deconditioning.  Cough, wheeze: Suspect related to asthma, triggered after viral illness.  Budesonide high-dose nebulizer has helped.  Cough largely gone.  Wheeze markedly improved.  Add LABA via nebulizer, okay to mix.  Return in about 3 months (around 03/23/2022).   Lanier Clam, MD 12/22/2021

## 2021-12-24 ENCOUNTER — Other Ambulatory Visit: Payer: Self-pay

## 2021-12-24 ENCOUNTER — Encounter: Payer: Self-pay | Admitting: Pulmonary Disease

## 2021-12-24 MED ORDER — AEROCHAMBER MV MISC: 0 refills | Status: AC

## 2021-12-24 MED ORDER — ALBUTEROL SULFATE HFA 108 (90 BASE) MCG/ACT IN AERS: 2.0000 | INHALATION_SPRAY | RESPIRATORY_TRACT | 6 refills | Status: DC | PRN

## 2021-12-31 DIAGNOSIS — R0609 Other forms of dyspnea: Secondary | ICD-10-CM | POA: Diagnosis not present

## 2021-12-31 DIAGNOSIS — G4733 Obstructive sleep apnea (adult) (pediatric): Secondary | ICD-10-CM | POA: Diagnosis not present

## 2021-12-31 DIAGNOSIS — R0600 Dyspnea, unspecified: Secondary | ICD-10-CM | POA: Diagnosis not present

## 2021-12-31 DIAGNOSIS — J45909 Unspecified asthma, uncomplicated: Secondary | ICD-10-CM | POA: Diagnosis not present

## 2022-01-01 DIAGNOSIS — R062 Wheezing: Secondary | ICD-10-CM | POA: Diagnosis not present

## 2022-01-02 DIAGNOSIS — I10 Essential (primary) hypertension: Secondary | ICD-10-CM | POA: Diagnosis not present

## 2022-01-02 DIAGNOSIS — E1165 Type 2 diabetes mellitus with hyperglycemia: Secondary | ICD-10-CM | POA: Diagnosis not present

## 2022-01-02 DIAGNOSIS — N182 Chronic kidney disease, stage 2 (mild): Secondary | ICD-10-CM | POA: Diagnosis not present

## 2022-01-02 DIAGNOSIS — E782 Mixed hyperlipidemia: Secondary | ICD-10-CM | POA: Diagnosis not present

## 2022-01-04 DIAGNOSIS — B351 Tinea unguium: Secondary | ICD-10-CM | POA: Diagnosis not present

## 2022-01-04 DIAGNOSIS — E1151 Type 2 diabetes mellitus with diabetic peripheral angiopathy without gangrene: Secondary | ICD-10-CM | POA: Diagnosis not present

## 2022-01-15 DIAGNOSIS — Z23 Encounter for immunization: Secondary | ICD-10-CM | POA: Diagnosis not present

## 2022-01-31 DIAGNOSIS — R062 Wheezing: Secondary | ICD-10-CM | POA: Diagnosis not present

## 2022-02-02 ENCOUNTER — Other Ambulatory Visit: Payer: Self-pay | Admitting: Nurse Practitioner

## 2022-02-02 DIAGNOSIS — E1165 Type 2 diabetes mellitus with hyperglycemia: Secondary | ICD-10-CM | POA: Diagnosis not present

## 2022-02-02 DIAGNOSIS — I10 Essential (primary) hypertension: Secondary | ICD-10-CM | POA: Diagnosis not present

## 2022-02-02 DIAGNOSIS — N182 Chronic kidney disease, stage 2 (mild): Secondary | ICD-10-CM | POA: Diagnosis not present

## 2022-02-02 DIAGNOSIS — E782 Mixed hyperlipidemia: Secondary | ICD-10-CM | POA: Diagnosis not present

## 2022-02-05 ENCOUNTER — Encounter: Payer: Self-pay | Admitting: Cardiovascular Disease

## 2022-02-10 DIAGNOSIS — Z1231 Encounter for screening mammogram for malignant neoplasm of breast: Secondary | ICD-10-CM | POA: Diagnosis not present

## 2022-02-22 DIAGNOSIS — M85851 Other specified disorders of bone density and structure, right thigh: Secondary | ICD-10-CM | POA: Diagnosis not present

## 2022-02-22 DIAGNOSIS — M85852 Other specified disorders of bone density and structure, left thigh: Secondary | ICD-10-CM | POA: Diagnosis not present

## 2022-02-22 DIAGNOSIS — Z780 Asymptomatic menopausal state: Secondary | ICD-10-CM | POA: Diagnosis not present

## 2022-03-03 DIAGNOSIS — J45909 Unspecified asthma, uncomplicated: Secondary | ICD-10-CM | POA: Diagnosis not present

## 2022-03-03 DIAGNOSIS — R0602 Shortness of breath: Secondary | ICD-10-CM | POA: Diagnosis not present

## 2022-03-04 DIAGNOSIS — N182 Chronic kidney disease, stage 2 (mild): Secondary | ICD-10-CM | POA: Diagnosis not present

## 2022-03-04 DIAGNOSIS — E1165 Type 2 diabetes mellitus with hyperglycemia: Secondary | ICD-10-CM | POA: Diagnosis not present

## 2022-03-04 DIAGNOSIS — I10 Essential (primary) hypertension: Secondary | ICD-10-CM | POA: Diagnosis not present

## 2022-03-04 DIAGNOSIS — E782 Mixed hyperlipidemia: Secondary | ICD-10-CM | POA: Diagnosis not present

## 2022-03-08 ENCOUNTER — Ambulatory Visit (INDEPENDENT_AMBULATORY_CARE_PROVIDER_SITE_OTHER): Payer: Medicare Other | Admitting: Pulmonary Disease

## 2022-03-08 ENCOUNTER — Encounter: Payer: Self-pay | Admitting: Pulmonary Disease

## 2022-03-08 VITALS — BP 122/68 | HR 87 | Ht 61.0 in | Wt 252.0 lb

## 2022-03-08 DIAGNOSIS — J4540 Moderate persistent asthma, uncomplicated: Secondary | ICD-10-CM

## 2022-03-08 DIAGNOSIS — R0609 Other forms of dyspnea: Secondary | ICD-10-CM

## 2022-03-08 LAB — PULMONARY FUNCTION TEST
DL/VA % pred: 91 %
DL/VA: 3.81 ml/min/mmHg/L
DLCO cor % pred: 79 %
DLCO cor: 13.4 ml/min/mmHg
DLCO unc % pred: 79 %
DLCO unc: 13.4 ml/min/mmHg
FEF 25-75 Post: 2.15 L/sec
FEF 25-75 Pre: 1.22 L/sec
FEF2575-%Change-Post: 76 %
FEF2575-%Pred-Post: 181 %
FEF2575-%Pred-Pre: 102 %
FEV1-%Change-Post: 11 %
FEV1-%Pred-Post: 102 %
FEV1-%Pred-Pre: 91 %
FEV1-Post: 1.67 L
FEV1-Pre: 1.49 L
FEV1FVC-%Change-Post: 7 %
FEV1FVC-%Pred-Pre: 105 %
FEV6-%Change-Post: 3 %
FEV6-%Pred-Post: 96 %
FEV6-%Pred-Pre: 92 %
FEV6-Post: 2 L
FEV6-Pre: 1.93 L
FEV6FVC-%Pred-Post: 106 %
FEV6FVC-%Pred-Pre: 106 %
FVC-%Change-Post: 3 %
FVC-%Pred-Post: 90 %
FVC-%Pred-Pre: 87 %
FVC-Post: 2 L
FVC-Pre: 1.93 L
Post FEV1/FVC ratio: 84 %
Post FEV6/FVC ratio: 100 %
Pre FEV1/FVC ratio: 77 %
Pre FEV6/FVC Ratio: 100 %
RV % pred: 73 %
RV: 1.65 L
TLC % pred: 79 %
TLC: 3.67 L

## 2022-03-08 NOTE — Progress Notes (Signed)
PFT done today. 

## 2022-03-08 NOTE — Patient Instructions (Signed)
Nice to see you again  No changes to medications today  Continue the arformoterol and budesonide nebulizer every day twice a day  I recommend using albuterol as needed.  Use it whenever you feel wheezy or very short of breath.  Keep track of how often you are using it and if it is helpful or not.  Please send me a message in the next few weeks and update me.  If you are needing it too frequently and it is still helping, I will likely recommend an additional nebulized medicine.  Your blood work shows some eosinophils.  They are not particularly high but they are at the level where some people need additional medications to treat asthma that can be made worse by the eosinophils.  The medicine I often use for this is called Nucala.  This is the injectable medicine we talked about.  We can discuss this again in the future if needed.  Return to clinic in 6 months or sooner if needed with Dr. Silas Flood

## 2022-03-08 NOTE — Progress Notes (Signed)
$'@Patient'G$  ID: Shirley [name], female    DOB: 1941/03/13, 81 y.o.   MRN: [id number]  Chief Complaint  Patient presents with   Follow-up    Pt is here for follow up for DOE. Pt states that she has still been having to use the nebulizer and the inhalers often. Pt had PFTs earlier this morning. Pt states she feels like the inhalers are helping a little bit with her DOE. Pt states she feels wheezy most days.     Referring provider: Merrilee Seashore, MD  HPI:   81 y.o. woman whom are seen in follow up  for evaluation of wheeze, mild dyspnea on exertion felt to be related to asthma given temporary improvement with ICS and prednisone.   Overall, cough remains essentially resolved.  Still with wheeze.   She is on budesonide high-dose neb.  She uses albuterol relatively infrequently.  She states that she think she can probably use it more based on her symptoms but does not like to take medicine.  We discussed at length likely diagnosis of asthma.  The importance of using medicines when needed.  Discussed using albuterol when she feels she needs it so we can keep track of how often she needs it and if it is helpful or not.  We discussed additional as medications including additional bronchodilators versus Biologics given her mild elevation of eosinophils, up to 300 12/2021.  She states she does not want to use more medicines I empathized with.  We agreed to keep track of as needed albuterol use and make medication changes if needed based on frequency of use.  We reviewed her pulmonary function test performed today.  See below for full interpretation but these are normal.  HPI at initial visit: Patient was in usual health.  Developed ear pain, sore throat, runny nose, cough, chest tightness and wheeze.  This was April 2023.  Got some antibiotics.  Mild improvement.  Seen by PCP.  Given prednisone and additional antibiotics.  Prednisone helped immensely.  Sore throat, ear pain, cough largely  improved.  Mild residual cough.  Wheezes main thing that persist.  No time of day when things are better or worse.  No position make things better or worse.  No seasonal environmental factors she can identify to make things better or worse.  Does not use albuterol afraid of how will make her feel.  Prescribe budesonide nebulized 0.25 mg twice daily.  Feels this does help a bit.  Helps with decreasing the frequency of wheeze.  She denies significant allergy symptoms except for the spring.  No recurrent bronchitis.  No prior history of asthma.  No other respiratory illness.  She had chest x-ray 08/2021 that on my review interpretation reveals clear lungs bilaterally.  Most recent cross-sectional imaging CTA chest 10/2019 reviewed interpreted as clear lungs bilaterally.  PMH: OSA on CPAP hypertension, Surgical history: Tubal ligation, total hysterectomy and oophorectomy, D&C of uterus, rotator cuff repair Family history: Mother with CAD, hyperlipidemia, diabetes, Father with CAD Social history: Never smoker, lives in [place] / Pulmonary Flowsheets:   ACT:      No data to display          MMRC:     No data to display          Epworth:      No data to display          Tests:   FENO:  No results found for: "NITRICOXIDE"  PFT:    Latest Ref Rng & Units 03/08/2022    8:54 AM  PFT Results  FVC-Pre L 1.93  P  FVC-Predicted Pre % 87  P  FVC-Post L 2.00  P  FVC-Predicted Post % 90  P  Pre FEV1/FVC % % 77  P  Post FEV1/FCV % % 84  P  FEV1-Pre L 1.49  P  FEV1-Predicted Pre % 91  P  FEV1-Post L 1.67  P  DLCO uncorrected ml/min/mmHg 13.40  P  DLCO UNC% % 79  P  DLCO corrected ml/min/mmHg 13.40  P  DLCO COR %Predicted % 79  P  DLVA Predicted % 91  P  TLC L 3.67  P  TLC % Predicted % 79  P  RV % Predicted % 73  P    P Preliminary result    WALK:      No data to display        Personally reviewed interpret as normal spirometry, no significant  bronchodilator response.  Lung volumes within normal notes.  DLCO within normal limits.  Imaging: Personally reviewed and as per EMR discussion this note No results found.  Lab Results: Personally reviewed CBC    Component Value Date/Time   WBC 13.0 (H) 12/18/2021 1017   WBC 11.5 (H) 08/03/2021 1826   RBC 4.90 12/18/2021 1017   HGB 14.1 12/18/2021 1017   HGB 13.3 10/17/2019 0914   HGB 12.4 12/13/2014 0857   HCT 41.9 12/18/2021 1017   HCT 41.2 10/17/2019 0914   HCT 37.4 12/13/2014 0857   PLT 212 12/18/2021 1017   PLT 267 10/17/2019 0914   MCV 85.5 12/18/2021 1017   MCV 86 10/17/2019 0914   MCV 84.2 12/13/2014 0857   MCH 28.8 12/18/2021 1017   MCHC 33.7 12/18/2021 1017   RDW 13.5 12/18/2021 1017   RDW 13.1 10/17/2019 0914   RDW 14.7 (H) 12/13/2014 0857   LYMPHSABS 2.8 12/18/2021 1017   LYMPHSABS 6.3 (H) 12/13/2014 0857   MONOABS 0.9 12/18/2021 1017   MONOABS 0.7 12/13/2014 0857   EOSABS 0.3 12/18/2021 1017   EOSABS 0.4 12/13/2014 0857   BASOSABS 0.1 12/18/2021 1017   BASOSABS 0.1 12/13/2014 0857    BMET    Component Value Date/Time   NA 137 12/18/2021 1017   NA 137 07/09/2020 0831   NA 137 12/13/2014 0857   K 3.9 12/18/2021 1017   K 3.8 12/13/2014 0857   CL 102 12/18/2021 1017   CO2 29 12/18/2021 1017   CO2 28 12/13/2014 0857   GLUCOSE 146 (H) 12/18/2021 1017   GLUCOSE 98 12/13/2014 0857   BUN 15 12/18/2021 1017   BUN 12 07/09/2020 0831   BUN 10.6 12/13/2014 0857   CREATININE 0.68 12/18/2021 1017   CREATININE 0.7 12/13/2014 0857   CALCIUM 8.9 12/18/2021 1017   CALCIUM 8.8 12/13/2014 0857   GFRNONAA >60 12/18/2021 1017   GFRAA >60 12/06/2019 0923    BNP    Component Value Date/Time   BNP 147.7 (H) 08/03/2021 1826    ProBNP    Component Value Date/Time   PROBNP 64 07/09/2020 0831   PROBNP 64.6 09/15/2012 2345    Specialty Problems       Pulmonary Problems   Obstructive sleep apnea    Allergies  Allergen Reactions   Iohexol Rash and  Other (See Comments)    Patient stated a rash appeared after a CT scan    Hydrochlorothiazide Other (See Comments)    12/18/21: Palpitations and  SOB per the pt - AS    Immunization History  Administered Date(s) Administered   Influenza, High Dose Seasonal PF 12/26/2014, 12/15/2015   Influenza, Quadrivalent, Recombinant, Inj, Pf 01/06/2017, 01/04/2019, 01/11/2020, 01/16/2021   Influenza,inj,Quad PF,6+ Mos 12/20/2013   Influenza-Unspecified 01/04/2012, 12/04/2016, 12/05/2018   PFIZER Comirnaty(Gray Top)Covid-19 Tri-Sucrose Vaccine 08/15/2020   PFIZER(Purple Top)SARS-COV-2 Vaccination 04/17/2019, 05/08/2019, 12/03/2019, 12/04/2019   PNEUMOCOCCAL CONJUGATE-20 05/06/2021   Pfizer Covid-19 Vaccine Bivalent Booster 92yr & up 01/07/2021   Pneumococcal Conjugate-13 02/10/2015   Pneumococcal Polysaccharide-23 11/02/2010, 01/04/2012   Tdap 11/02/2010    Past Medical History:  Diagnosis Date   BMI 40.0-44.9, adult ([facility]    CAD in native artery, stent to RCA BMS with MI 2015 11/04/2003   a. inferior STEMI 01/2014 s/p BMS to prox RCA.   DVT (deep venous thrombosis) ([facility] 2003   lower extremity DVT with questionable recurrence in 2005. Another admission 2015.   [place]    Endometrial thickening on ultra sound 07/2009   path showing Fragments of benign endocervical and squamous mucosa. No dysplasia or malignancy // Followed by Dr. AGus Height  First degree AV block    Gastric ulcer    GERD (gastroesophageal reflux disease)    GI bleed    Hemorrhoid    Hyperlipidemia LDL goal <70    Hypertension    Insomnia 11/02/2010   Memory difficulties 11/02/2010   Mild dilation of ascending aorta ([facility]    Morbid obesity ([facility] 09/16/2012   Obstructive sleep apnea 09/23/2010   Pre-diabetes    Pulmonary nodule    ST elevation myocardial infarction (STEMI) of inferior wall ([facility] 01/24/2014   Stroke ([facility] 2015   Syncope 12/18/2013   Syncope, near 12/14/2018   TIA (transient ischemic  attack) 09/23/2010   09/13/2010: CT, MRI/MRA no acute process. Carotid doppler: no stenosis. 2D-Echo Normal LV function with an EF 55-60%.    Trifascicular block    UTI (urinary tract infection) 01/27/2014    Tobacco History: Social History   Tobacco Use  Smoking Status Never  Smokeless Tobacco Never   Counseling given: Not Answered   Continue to not smoke  Outpatient Encounter Medications as of 03/08/2022  Medication Sig   acetaminophen (TYLENOL) 500 MG tablet Take 500 mg by mouth 2 (two) times daily as needed (for pain).   albuterol (PROVENTIL) (2.5 MG/3ML) 0.083% nebulizer solution SMARTSIG:3 Milliliter(s) Via Inhaler 4 Times Daily PRN   albuterol (VENTOLIN HFA) 108 (90 Base) MCG/ACT inhaler Inhale 2 puffs into the lungs every 4 (four) hours as needed for wheezing or shortness of breath.   amLODipine (NORVASC) 10 MG tablet Take 10 mg by mouth daily.   arformoterol (BROVANA) 15 MCG/2ML NEBU Take 2 mLs (15 mcg total) by nebulization 2 (two) times daily.   aspirin EC 81 MG tablet Swallow whole. Take one tablet 3 times week.   atorvastatin (LIPITOR) 80 MG tablet TAKE 1 TABLET BY MOUTH EVERY DAY   budesonide (PULMICORT) 0.5 MG/2ML nebulizer solution Take 2 mLs (0.5 mg total) by nebulization 2 (two) times daily.   busPIRone (BUSPAR) 7.5 MG tablet Take 7.5 mg by mouth 2 (two) times daily.   cholecalciferol (VITAMIN D3) 25 MCG (1000 UNIT) tablet Take 1,000 Units by mouth daily.   ELIQUIS 2.5 MG TABS tablet TAKE 1 TABLET BY MOUTH TWICE A DAY   Ferrous Gluconate 239 (27 Fe) MG TABS Take by mouth.   furosemide (LASIX) 20 MG tablet Take 1 tablet (20 mg total) by mouth daily.  ibuprofen (ADVIL) 200 MG tablet Take 200 mg by mouth every 6 (six) hours as needed for headache or moderate pain.   lisinopril (ZESTRIL) 40 MG tablet Take 40 mg by mouth daily.   metoprolol succinate (TOPROL-XL) 25 MG 24 hr tablet TAKE 1 TABLET (25 MG TOTAL) BY MOUTH IN THE MORNING AND AT BEDTIME.   Multiple Vitamin  (MULTIVITAMIN WITH MINERALS) TABS Take 1 tablet by mouth every morning.   Naphazoline HCl (CLEAR EYES OP) Place 1 drop into both eyes as needed (for irritation or dryness).    nitroGLYCERIN (NITROSTAT) 0.4 MG SL tablet Place 1 tablet (0.4 mg total) under the tongue every 5 (five) minutes x 3 doses as needed for chest pain.   omeprazole (PRILOSEC) 20 MG capsule Take 1 capsule (20 mg total) by mouth every other day.   potassium chloride (KLOR-CON) 10 MEQ tablet Take 1 tablet (10 mEq total) by mouth daily.   Pseudoeph-Bromphen-DM (BROMFED DM PO) Take 5 mLs by mouth 3 (three) times daily.   sodium chloride (OCEAN) 0.65 % SOLN nasal spray Place 1 spray into both nostrils as needed for congestion.   Spacer/Aero-Holding Chambers (AEROCHAMBER MV) inhaler Use as instructed   telmisartan (MICARDIS) 80 MG tablet Take 80 mg by mouth daily.   No facility-administered encounter medications on file as of 03/08/2022.     Review of Systems  Review of Systems  N/a Physical Exam  BP 122/68 (BP Location: Left Arm, Patient Position: Sitting, Cuff Size: Normal)   Pulse 87   Ht '5\' 1"'$  (1.549 m)   Wt 252 lb (114.3 kg)   SpO2 98%   BMI 47.61 kg/m   Wt Readings from Last 5 Encounters:  03/08/22 252 lb (114.3 kg)  12/22/21 254 lb 6.4 oz (115.4 kg)  12/18/21 252 lb 4.8 oz (114.4 kg)  09/28/21 255 lb 9.6 oz (115.9 kg)  09/21/21 261 lb 6.4 oz (118.6 kg)    BMI Readings from Last 5 Encounters:  03/08/22 47.61 kg/m  12/22/21 45.06 kg/m  12/18/21 44.69 kg/m  09/28/21 45.28 kg/m  09/21/21 46.30 kg/m     Physical Exam General: Well-appearing, no acute distress Eyes: EOMI, no icterus Neck: Supple, no JVP Pulmonary: Clear, no wheeze Cardiovascular: Regular rate and rhythm, no murmur MSK: No synovitis, no joint effusion Abdomen: Nondistended, bowel sounds present Neuro: Normal gait, no weakness Psych: Normal mood, full affect   Assessment & Plan:   Dyspnea on exertion: Mildly improved with  asthma therapies.  Currently on nebulized ICS and LABA therapy.  PFTs performed 03/2022 are normal.  Cough, wheeze: Suspect related to asthma, triggered after viral illness.  Budesonide high-dose nebulizer has helped.  Cough largely gone.  Wheeze markedly improved.  Arformoterol was added 12/2021.  With residual wheeze and shortness of breath.  Asked her to keep track of as needed albuterol use and if frequent discussed addition of LAMA therapy versus escalate to Biologics given elevated eosinophils in the past.  Extensive counseling given today regarding biologic medication.  Return in about 6 months (around 09/07/2022).   Lanier Clam, MD 03/08/2022   I spent 40 minutes in the care of the patient including review of records, face-to-face visit, coordination of care.

## 2022-04-02 DIAGNOSIS — R0602 Shortness of breath: Secondary | ICD-10-CM | POA: Diagnosis not present

## 2022-04-02 DIAGNOSIS — J45909 Unspecified asthma, uncomplicated: Secondary | ICD-10-CM | POA: Diagnosis not present

## 2022-04-25 ENCOUNTER — Other Ambulatory Visit: Payer: Self-pay | Admitting: Pulmonary Disease

## 2022-04-25 DIAGNOSIS — R0609 Other forms of dyspnea: Secondary | ICD-10-CM

## 2022-04-25 DIAGNOSIS — J4540 Moderate persistent asthma, uncomplicated: Secondary | ICD-10-CM

## 2022-05-03 DIAGNOSIS — J45909 Unspecified asthma, uncomplicated: Secondary | ICD-10-CM | POA: Diagnosis not present

## 2022-05-03 DIAGNOSIS — R0602 Shortness of breath: Secondary | ICD-10-CM | POA: Diagnosis not present

## 2022-05-27 DIAGNOSIS — I10 Essential (primary) hypertension: Secondary | ICD-10-CM | POA: Diagnosis not present

## 2022-05-27 DIAGNOSIS — R5383 Other fatigue: Secondary | ICD-10-CM | POA: Diagnosis not present

## 2022-05-27 DIAGNOSIS — E1165 Type 2 diabetes mellitus with hyperglycemia: Secondary | ICD-10-CM | POA: Diagnosis not present

## 2022-05-27 DIAGNOSIS — E782 Mixed hyperlipidemia: Secondary | ICD-10-CM | POA: Diagnosis not present

## 2022-05-27 DIAGNOSIS — N182 Chronic kidney disease, stage 2 (mild): Secondary | ICD-10-CM | POA: Diagnosis not present

## 2022-05-28 LAB — LAB REPORT - SCANNED
A1c: 9.14
Albumin/Creatinine Ratio, Urine, POC: 13
Creatinine, POC: 46.1 mg/dL
Microalbumin, Urine: 0.6

## 2022-06-03 DIAGNOSIS — R0602 Shortness of breath: Secondary | ICD-10-CM | POA: Diagnosis not present

## 2022-06-03 DIAGNOSIS — D6869 Other thrombophilia: Secondary | ICD-10-CM | POA: Diagnosis not present

## 2022-06-03 DIAGNOSIS — N182 Chronic kidney disease, stage 2 (mild): Secondary | ICD-10-CM | POA: Diagnosis not present

## 2022-06-03 DIAGNOSIS — M150 Primary generalized (osteo)arthritis: Secondary | ICD-10-CM | POA: Diagnosis not present

## 2022-06-03 DIAGNOSIS — D692 Other nonthrombocytopenic purpura: Secondary | ICD-10-CM | POA: Diagnosis not present

## 2022-06-03 DIAGNOSIS — R600 Localized edema: Secondary | ICD-10-CM | POA: Diagnosis not present

## 2022-06-03 DIAGNOSIS — Z0000 Encounter for general adult medical examination without abnormal findings: Secondary | ICD-10-CM | POA: Diagnosis not present

## 2022-06-03 DIAGNOSIS — E782 Mixed hyperlipidemia: Secondary | ICD-10-CM | POA: Diagnosis not present

## 2022-06-03 DIAGNOSIS — I700 Atherosclerosis of aorta: Secondary | ICD-10-CM | POA: Diagnosis not present

## 2022-06-03 DIAGNOSIS — E1142 Type 2 diabetes mellitus with diabetic polyneuropathy: Secondary | ICD-10-CM | POA: Diagnosis not present

## 2022-06-03 DIAGNOSIS — I10 Essential (primary) hypertension: Secondary | ICD-10-CM | POA: Diagnosis not present

## 2022-06-03 DIAGNOSIS — J45909 Unspecified asthma, uncomplicated: Secondary | ICD-10-CM | POA: Diagnosis not present

## 2022-06-03 DIAGNOSIS — J4540 Moderate persistent asthma, uncomplicated: Secondary | ICD-10-CM | POA: Diagnosis not present

## 2022-06-07 ENCOUNTER — Telehealth: Payer: Self-pay | Admitting: *Deleted

## 2022-06-07 NOTE — Patient Outreach (Signed)
  Care Coordination   06/07/2022 Name: Shirley [name] MRN: [id number] DOB: 07-27-1940   Care Coordination Outreach Attempts:  An unsuccessful telephone outreach was attempted today to offer the patient information about available care coordination services as a benefit of their health plan.   Follow Up Plan:  Additional outreach attempts will be made to offer the patient care coordination information and services.   Encounter Outcome:  No Answer   Care Coordination Interventions:  No, not indicated    Eduard Clos, MSW, [place] Worker [facility] [phone]

## 2022-06-15 ENCOUNTER — Telehealth: Payer: Self-pay | Admitting: Gastroenterology

## 2022-06-15 NOTE — Telephone Encounter (Signed)
Inbound call from patients daughter stating that patient has has loose dark stools for the last week an has been having acid reflux as well. Patients daughter is requesting a call back to discuss. Please advise.

## 2022-06-16 NOTE — Telephone Encounter (Signed)
Returned call to patient's daughter Caren Griffins. Caren Griffins reports that patient started having loose stools last week and she gave the patient Pepto-Bismol. I explained to Caren Griffins that Bismuth can cause the patient's stools to be dark, almost black in color. Pt is not having any dizziness or lightheadedness. Pt has ongoing SOB, this is not a new finding and she is being followed by Pulmonology and completing nebulizer treatments. Caren Griffins reports that patient was having about 5 stools a day, but they have been giving her liquid Imodium and her stools have reduced to 3 times a day. She has been advised to continue Imodium. Patient is no longer taking Omeprazole 20 mg every other day for her reflux - I recommended that she restart this medication - daughter will purchase OTC for now. Pt has been scheduled for a f/u with Dr. Loletha Carrow on 06/29/22 at 11 am. Caren Griffins verbalized understanding and had no concerns at the end of the call.

## 2022-06-17 ENCOUNTER — Other Ambulatory Visit: Payer: Self-pay | Admitting: Pulmonary Disease

## 2022-06-29 ENCOUNTER — Encounter: Payer: Self-pay | Admitting: Gastroenterology

## 2022-06-29 ENCOUNTER — Ambulatory Visit (INDEPENDENT_AMBULATORY_CARE_PROVIDER_SITE_OTHER): Payer: Medicare Other | Admitting: Gastroenterology

## 2022-06-29 ENCOUNTER — Other Ambulatory Visit (INDEPENDENT_AMBULATORY_CARE_PROVIDER_SITE_OTHER): Payer: Medicare Other

## 2022-06-29 VITALS — BP 158/76 | HR 65 | Ht 63.0 in | Wt 249.0 lb

## 2022-06-29 DIAGNOSIS — R194 Change in bowel habit: Secondary | ICD-10-CM

## 2022-06-29 DIAGNOSIS — K219 Gastro-esophageal reflux disease without esophagitis: Secondary | ICD-10-CM

## 2022-06-29 DIAGNOSIS — Z8711 Personal history of peptic ulcer disease: Secondary | ICD-10-CM

## 2022-06-29 LAB — IBC + FERRITIN
Ferritin: 122.9 ng/mL (ref 10.0–291.0)
Iron: 49 ug/dL (ref 42–145)
Saturation Ratios: 17.4 % — ABNORMAL LOW (ref 20.0–50.0)
TIBC: 281.4 ug/dL (ref 250.0–450.0)
Transferrin: 201 mg/dL — ABNORMAL LOW (ref 212.0–360.0)

## 2022-06-29 LAB — CBC WITH DIFFERENTIAL/PLATELET
Basophils Absolute: 0.1 10*3/uL (ref 0.0–0.1)
Basophils Relative: 1.1 % (ref 0.0–3.0)
Eosinophils Absolute: 0.4 10*3/uL (ref 0.0–0.7)
Eosinophils Relative: 3.9 % (ref 0.0–5.0)
HCT: 41.2 % (ref 36.0–46.0)
Hemoglobin: 13.9 g/dL (ref 12.0–15.0)
Lymphocytes Relative: 31.2 % (ref 12.0–46.0)
Lymphs Abs: 3 10*3/uL (ref 0.7–4.0)
MCHC: 33.8 g/dL (ref 30.0–36.0)
MCV: 85.4 fl (ref 78.0–100.0)
Monocytes Absolute: 0.7 10*3/uL (ref 0.1–1.0)
Monocytes Relative: 7 % (ref 3.0–12.0)
Neutro Abs: 5.5 10*3/uL (ref 1.4–7.7)
Neutrophils Relative %: 56.8 % (ref 43.0–77.0)
Platelets: 242 10*3/uL (ref 150.0–400.0)
RBC: 4.83 Mil/uL (ref 3.87–5.11)
RDW: 13.9 % (ref 11.5–15.5)
WBC: 9.6 10*3/uL (ref 4.0–10.5)

## 2022-06-29 NOTE — Progress Notes (Signed)
[facility] Consult Note:  History: Shirley [name] 06/29/2022  Referring provider: Merrilee Seashore, MD  Reason for consult/chief complaint: Gastroesophageal Reflux (Globus sensation, taking omeprazole 20 mg once daily), Diarrhea (Onset a month, has resolved. Used Imodium), and Melena (One time last week she had dark stool but says it is reasolved now.)   Subjective  HPI: From my May 2022 office note: "Shirley [name] was seen for the first time since her upper endoscopy in August 2020.  She had been hospitalized about a month prior to that with a bleeding gastric ulcer, was H. pylori positive and on aspirin for history of coronary artery disease.  H. pylori was treated with amoxicillin and clarithromycin, repeat upper endoscopy on 11/06/2018 showed healed ulcer and biopsies confirmed eradication of H. pylori.  Because she needed to stay on aspirin for CAD, I recommended indefinite use of once daily PPI. She also has a history of DVT and is maintained on chronic [place].   Shirley [name] was here with her daughter today.  She denies heartburn as before, denies dysphagia or odynophagia.  Sometimes she feels a "bubble of gas" in the lower chest and has to belch.  She has no black or bloody stool.  Was taking omeprazole daily until she ran out a few days ago.  She also stopped aspirin, now only taking it maybe once a month "when my blood pressure is up".  This seems to be news to her daughter"  I communicated with cardiologist to ask if she needs to be on long-term aspirin.  My feelings were that if she did, she should remain on indefinite EPI 1 tablet every other day.  If she did not eat aspirin, then I felt the PPI could be stopped because the ulcer was from combination of aspirin and H. pylori that had been eradicated. _______________ Shirley [name]'s daughter contacted [place] about 2 weeks ago saying her mother had heartburn and about a week of loose and frequent stool, and stool then turned dark after  taking Pepto-Bismol.  Shirley [name] was here with her daughter today.  She had loose stool on and off for about 2 weeks until it finally resolved.  Stool is intermittently dark during that time but also resolved.  She has intermittent dyspeptic symptoms with regurgitation or heartburn, and sometimes feels that food is "slow to pass" to lower esophagus.  She continues to take aspirin when she feels that she needs it, particularly if she thinks her blood pressure is elevated.  It is not clear that she has consulted with her cardiologist about the need to be on aspirin.  Still taking omeprazole 20 mg daily.   ROS:  Review of Systems Denies chest pain or dyspnea + Anxiety + Arthralgias Remainder systems negative except as above  Past Medical History: Past Medical History:  Diagnosis Date   BMI 40.0-44.9, adult ([place])    CAD in native artery, stent to RCA BMS with MI 2015 11/04/2003   a. inferior STEMI 01/2014 s/p BMS to prox RCA.   DVT (deep venous thrombosis) ([place]) 2003   lower extremity DVT with questionable recurrence in 2005. Another admission 2015.   Endometrial cancer ([place]) 2021   Endometrial thickening on ultra sound 07/2009   path showing Fragments of benign endocervical and squamous mucosa. No dysplasia or malignancy // Followed by Dr. [name]   First degree AV block    Gastric ulcer    GERD (gastroesophageal reflux disease)    GI bleed    Hemorrhoid  Hyperlipidemia LDL goal <70    Hypertension    Insomnia 11/02/2010   Memory difficulties 11/02/2010   Mild dilation of ascending aorta ([place])    Morbid obesity ([place]) 09/16/2012   Obstructive sleep apnea 09/23/2010   Pre-diabetes    Pulmonary nodule    ST elevation myocardial infarction (STEMI) of inferior wall ([place]) 01/24/2014   Stroke ([place]) 2015   Syncope 12/18/2013   Syncope, near 12/14/2018   TIA (transient ischemic attack) 09/23/2010   09/13/2010: CT, MRI/MRA no acute process. Carotid doppler: no stenosis. 2D-Echo Normal LV  function with an EF 55-60%.    Trifascicular block    UTI (urinary tract infection) 01/27/2014     Past Surgical History: Past Surgical History:  Procedure Laterality Date   COLONOSCOPY W/ POLYPECTOMY  11/01/2007   8 mm rectal adenoma, hemorrhoids   CORONARY ANGIOPLASTY WITH STENT PLACEMENT  01/24/14   Inf. STEMI, BMS to RCA   [place] N/A 03/28/2019   Procedure: DILATATION AND CURETTAGE OF UTERUS;  Surgeon: Lafonda Mosses, MD;  Location: [facility];  Service: Gynecology;  Laterality: N/A;   ESOPHAGOGASTRODUODENOSCOPY N/A 10/06/2018   Procedure: ESOPHAGOGASTRODUODENOSCOPY (EGD);  Surgeon: Doran Stabler, MD;  Location: [facility];  Service: Gastroenterology;  Laterality: N/A;   HOT HEMOSTASIS N/A 10/06/2018   Procedure: HOT HEMOSTASIS (ARGON PLASMA COAGULATION/BICAP);  Surgeon: Doran Stabler, MD;  Location: [facility];  Service: Gastroenterology;  Laterality: N/A;   INTRAUTERINE DEVICE (IUD) INSERTION N/A 03/28/2019   Procedure: INTRAUTERINE DEVICE (IUD) INSERTION;  Surgeon: Lafonda Mosses, MD;  Location: [facility];  Service: Gynecology;  Laterality: N/A;   KNEE SURGERY  1996   Left knee arthroscopy.   LEFT HEART CATHETERIZATION WITH CORONARY ANGIOGRAM N/A 01/24/2014   Procedure: LEFT HEART CATHETERIZATION WITH CORONARY ANGIOGRAM;  Surgeon: Burnell Blanks, MD;  Location[facility];  Service: Cardiovascular;  Laterality: N/A;   ROBOTIC ASSISTED TOTAL HYSTERECTOMY WITH BILATERAL SALPINGO OOPHERECTOMY N/A 09/11/2019   Procedure: XI ROBOTIC ASSISTED TOTAL HYSTERECTOMY WITH BILATERAL SALPINGO OOPHORECTOMY, LYMPH NODE DISSECTION;  Surgeon: Lafonda Mosses, MD;  Location: [facility];  Service: Gynecology;  Laterality: N/A;   ROTATOR CUFF REPAIR     right.   SCLEROTHERAPY  10/06/2018   Procedure: SCLEROTHERAPY;  Surgeon: Doran Stabler, MD;  Location: [facility] ENDOSCOPY;  Service: Gastroenterology;;   SENTINEL NODE BIOPSY N/A 09/11/2019   Procedure:  SENTINEL NODE BIOPSY;  Surgeon: Lafonda Mosses, MD;  Location: [facility];  Service: Gynecology;  Laterality: N/A;   TUBAL LIGATION       Family History: Family History  Problem Relation Age of Onset   Diabetes Mother    Hyperlipidemia Mother    Heart disease Mother    Dementia Mother    Angina Father    Heart attack Father    Thyroid cancer Sister    Liver cancer Sister    Heart disease Brother        x 2 in 76s yo   Prostate cancer Brother 65   Alzheimer's disease Maternal Grandmother    Cirrhosis Daughter    Stroke Neg Hx    Colon cancer Neg Hx    Esophageal cancer Neg Hx    Rectal cancer Neg Hx    Stomach cancer Neg Hx    Ovarian cancer Neg Hx    Endometrial cancer Neg Hx     Social History: Social History   Socioeconomic History   Marital status: Married  Spouse name: Not on file   Number of children: 6   Years of education: College   Highest education level: Not on file  Occupational History   Occupation: Control and instrumentation engineer   Occupation: TEACHER- [employer]    Employer: [employer]  Tobacco Use   Smoking status: Never   Smokeless tobacco: Never  Vaping Use   Vaping Use: Never used  Substance and Sexual Activity   Alcohol use: No   Drug use: No   Sexual activity: Not on file  Other Topics Concern   Not on file  Social History Narrative   Full Code status.   Insurance: BCBS   Social Determinants of Health   Financial Resource Strain: Not on file  Food Insecurity: Not on file  Transportation Needs: Not on file  Physical Activity: Not on file  Stress: Not on file  Social Connections: Not on file    Allergies: Allergies  Allergen Reactions   Iohexol Rash and Other (See Comments)    Patient stated a rash appeared after a CT scan    Hydrochlorothiazide Other (See Comments)    12/18/21: Palpitations and SOB per the pt - AS    Outpatient Meds: Current Outpatient Medications  Medication Sig Dispense Refill   acetaminophen  (TYLENOL) 500 MG tablet Take 500 mg by mouth 2 (two) times daily as needed (for pain).     albuterol (PROVENTIL) (2.5 MG/3ML) 0.083% nebulizer solution SMARTSIG:3 Milliliter(s) Via Inhaler 4 Times Daily PRN 75 mL 4   albuterol (VENTOLIN HFA) 108 (90 Base) MCG/ACT inhaler Inhale 2 puffs into the lungs every 4 (four) hours as needed for wheezing or shortness of breath. 8 g 6   amLODipine (NORVASC) 10 MG tablet Take 10 mg by mouth daily.     arformoterol (BROVANA) 15 MCG/2ML NEBU TAKE 2 MLS (15 MCG TOTAL) BY NEBULIZATION 2 (TWO) TIMES DAILY. 120 mL 3   aspirin EC 81 MG tablet Swallow whole. Take one tablet 3 times week. 90 tablet 3   atorvastatin (LIPITOR) 80 MG tablet TAKE 1 TABLET BY MOUTH EVERY DAY 90 tablet 1   budesonide (PULMICORT) 0.5 MG/2ML nebulizer solution TAKE 2 ML (0.5 MG TOTAL) BY NEBULIZATION TWICE A DAY 120 mL 6   busPIRone (BUSPAR) 7.5 MG tablet Take 7.5 mg by mouth 2 (two) times daily.     cholecalciferol (VITAMIN D3) 25 MCG (1000 UNIT) tablet Take 1,000 Units by mouth daily.     ELIQUIS 2.5 MG TABS tablet TAKE 1 TABLET BY MOUTH TWICE A DAY 60 tablet 5   Ferrous Gluconate 239 (27 Fe) MG TABS Take by mouth.     furosemide (LASIX) 20 MG tablet Take 1 tablet (20 mg total) by mouth daily. 90 tablet 1   ibuprofen (ADVIL) 200 MG tablet Take 200 mg by mouth every 6 (six) hours as needed for headache or moderate pain.     lisinopril (ZESTRIL) 40 MG tablet Take 40 mg by mouth daily.     metoprolol succinate (TOPROL-XL) 25 MG 24 hr tablet TAKE 1 TABLET (25 MG TOTAL) BY MOUTH IN THE MORNING AND AT BEDTIME. 180 tablet 1   Multiple Vitamin (MULTIVITAMIN WITH MINERALS) TABS Take 1 tablet by mouth every morning.     Naphazoline HCl (CLEAR EYES OP) Place 1 drop into both eyes as needed (for irritation or dryness).      nitroGLYCERIN (NITROSTAT) 0.4 MG SL tablet Place 1 tablet (0.4 mg total) under the tongue every 5 (five) minutes x 3 doses  as needed for chest pain. 25 tablet 3   omeprazole  (PRILOSEC) 20 MG capsule Take 1 capsule (20 mg total) by mouth every other day. (Patient taking differently: Take 20 mg by mouth daily.) 45 capsule 3   potassium chloride (KLOR-CON) 10 MEQ tablet Take 1 tablet (10 mEq total) by mouth daily. 90 tablet 1   Pseudoeph-Bromphen-DM (BROMFED DM PO) Take 5 mLs by mouth 3 (three) times daily.     sodium chloride (OCEAN) 0.65 % SOLN nasal spray Place 1 spray into both nostrils as needed for congestion.     Spacer/Aero-Holding Chambers (AEROCHAMBER MV) inhaler Use as instructed 1 each 0   telmisartan (MICARDIS) 80 MG tablet Take 80 mg by mouth daily.     No current facility-administered medications for this visit.      ___________________________________________________________________ Objective   Exam:  BP (!) 160/78   Pulse 65   Ht 5\' 3"  (1.6 m)   Wt 249 lb (112.9 kg)   SpO2 99%   BMI 44.11 kg/m  Wt Readings from Last 3 Encounters:  06/29/22 249 lb (112.9 kg)  03/08/22 252 lb (114.3 kg)  12/22/21 254 lb 6.4 oz (115.4 kg)    General: Well-appearing, antalgic gait, gets on exam table independently Eyes: sclera anicteric, no redness ENT: oral mucosa moist without lesions, no cervical or supraclavicular lymphadenopathy CV: Regular without appreciable murmur, no JVD, no peripheral edema Resp: clear to auscultation bilaterally, normal RR and effort noted GI: soft, no tenderness, with active bowel sounds. No guarding or palpable organomegaly noted.  Limited by body habitus/obesity Skin; warm and dry, no rash or jaundice noted Neuro: awake, alert and oriented x 3. Normal gross motor function and fluent speech  Labs:  Last CBC on file in this EMR September 2023-normal hemoglobin     Latest Ref Rng & Units 12/18/2021   10:17 AM 08/03/2021    6:26 PM 12/16/2020   10:04 AM  CBC  WBC 4.0 - 10.5 K/uL 13.0  11.5  8.7   Hemoglobin 12.0 - 15.0 g/dL 14.1  13.3  14.1   Hematocrit 36.0 - 46.0 % 41.9  41.2  43.5   Platelets 150 - 400 K/uL 212  180   227       Latest Ref Rng & Units 12/18/2021   10:17 AM 08/03/2021    6:26 PM 12/16/2020   10:04 AM  CMP  Glucose 70 - 99 mg/dL 146  163  166   BUN 8 - 23 mg/dL 15  8  10    Creatinine 0.44 - 1.00 mg/dL 0.68  0.65  0.72   Sodium 135 - 145 mmol/L 137  136  140   Potassium 3.5 - 5.1 mmol/L 3.9  4.6  4.5   Chloride 98 - 111 mmol/L 102  104  104   CO2 22 - 32 mmol/L 29  26  27    Calcium 8.9 - 10.3 mg/dL 8.9  8.8  9.2   Total Protein 6.5 - 8.1 g/dL 6.7   6.4   Total Bilirubin 0.3 - 1.2 mg/dL 1.1   0.7   Alkaline Phos 38 - 126 U/L 107   130   AST 15 - 41 U/L 20   15   ALT 0 - 44 U/L 29   20     Assessment: Encounter Diagnoses  Name Primary?   History of gastric ulcer Yes   Gastroesophageal reflux disease, unspecified whether esophagitis present    Altered bowel habits     Recent  episode of diarrhea, now resolved.  Stool was dark during that time, raising concern for her and her daughter about possible recurrence of bleeding gastric ulcer.  Seems unlikely since things resolved.  She does continue to take aspirin with a history of coronary disease, though it does not sound like she is taking it regularly as noted above.  She is however also on concomitant daily resolved.  I explained my previous thoughts and decision making regarding why her ulcer occurred and the need for long-term acid suppression if she will be on aspirin regularly.  Plan:  CBC and iron studies today. No additional tests beyond that or changes in medicines planned at this point.    30 minutes were spent on this encounter (including chart review, history/exam, counseling/coordination of care, and documentation) > 50% of that time was spent on counseling and coordination of care.   Nelida Meuse III  CC: Referring provider noted above

## 2022-06-29 NOTE — Patient Instructions (Signed)
_______________________________________________________  If your blood pressure at your visit was 140/90 or greater, please contact your primary care physician to follow up on this.  _______________________________________________________  If you are age 82 or older, your body mass index should be between 23-30. Your Body mass index is 44.11 kg/m. If this is out of the aforementioned range listed, please consider follow up with your Primary Care Provider.  If you are age 43 or younger, your body mass index should be between 19-25. Your Body mass index is 44.11 kg/m. If this is out of the aformentioned range listed, please consider follow up with your Primary Care Provider.   ________________________________________________________  The [facility] providers would like to encourage you to use [facility] to communicate with providers for non-urgent requests or questions.  Due to long hold times on the telephone, sending your provider a message by [facility] may be a faster and more efficient way to get a response.  Please allow 48 business hours for a response.  Please remember that this is for non-urgent requests.  _______________________________________________________  Your provider has requested that you go to the basement level for lab work before leaving today. Press "B" on the elevator. The lab is located at the first door on the left as you exit the elevator.  Due to recent changes in healthcare laws, you may see the results of your imaging and laboratory studies on MyChart before your provider has had a chance to review them.  We understand that in some cases there may be results that are confusing or concerning to you. Not all laboratory results come back in the same time frame and the provider may be waiting for multiple results in order to interpret others.  Please give [place] 48 hours in order for your provider to thoroughly review all the results before contacting the office for clarification of  your results.   It was a pleasure to see you today!  Thank you for trusting me with your gastrointestinal care!

## 2022-07-02 DIAGNOSIS — R0602 Shortness of breath: Secondary | ICD-10-CM | POA: Diagnosis not present

## 2022-07-02 DIAGNOSIS — J45909 Unspecified asthma, uncomplicated: Secondary | ICD-10-CM | POA: Diagnosis not present

## 2022-07-05 DIAGNOSIS — H52223 Regular astigmatism, bilateral: Secondary | ICD-10-CM | POA: Diagnosis not present

## 2022-07-05 DIAGNOSIS — H5203 Hypermetropia, bilateral: Secondary | ICD-10-CM | POA: Diagnosis not present

## 2022-07-05 DIAGNOSIS — H2513 Age-related nuclear cataract, bilateral: Secondary | ICD-10-CM | POA: Diagnosis not present

## 2022-07-05 DIAGNOSIS — Z135 Encounter for screening for eye and ear disorders: Secondary | ICD-10-CM | POA: Diagnosis not present

## 2022-07-05 DIAGNOSIS — H524 Presbyopia: Secondary | ICD-10-CM | POA: Diagnosis not present

## 2022-07-28 ENCOUNTER — Other Ambulatory Visit: Payer: Self-pay | Admitting: Pulmonary Disease

## 2022-09-21 NOTE — Progress Notes (Unsigned)
Office Visit    Patient Name: Shirley [name] Date of Encounter: 09/22/2022  Primary Care Provider:  Georgianne Fick, MD Primary Cardiologist:  Verne Carrow, MD Primary Electrophysiologist: None   Past Medical History    Past Medical History:  Diagnosis Date   BMI 40.0-44.9, adult (HCC)    CAD in native artery, stent to RCA BMS with MI 2015 11/04/2003   a. inferior STEMI 01/2014 s/p BMS to prox RCA.   DVT (deep venous thrombosis) (HCC) 2003   lower extremity DVT with questionable recurrence in 2005. Another admission 2015.   Endometrial cancer (HCC) 2021   Endometrial thickening on ultra sound 07/2009   path showing Fragments of benign endocervical and squamous mucosa. No dysplasia or malignancy // Followed by Dr. Miguel Aschoff   First degree AV block    Gastric ulcer    GERD (gastroesophageal reflux disease)    GI bleed    Hemorrhoid    Hyperlipidemia LDL goal <70    Hypertension    Insomnia 11/02/2010   Memory difficulties 11/02/2010   Mild dilation of ascending aorta (HCC)    Morbid obesity (HCC) 09/16/2012   Obstructive sleep apnea 09/23/2010   Pre-diabetes    Pulmonary nodule    ST elevation myocardial infarction (STEMI) of inferior wall (HCC) 01/24/2014   Stroke (HCC) 2015   Syncope 12/18/2013   Syncope, near 12/14/2018   TIA (transient ischemic attack) 09/23/2010   09/13/2010: CT, MRI/MRA no acute process. Carotid doppler: no stenosis. 2D-Echo Normal LV function with an EF 55-60%.    Trifascicular block    UTI (urinary tract infection) 01/27/2014   Past Surgical History:  Procedure Laterality Date   COLONOSCOPY W/ POLYPECTOMY  11/01/2007   8 mm rectal adenoma, hemorrhoids   CORONARY ANGIOPLASTY WITH STENT PLACEMENT  01/24/14   Inf. STEMI, BMS to RCA   DILATION AND CURETTAGE OF UTERUS N/A 03/28/2019   Procedure: DILATATION AND CURETTAGE OF UTERUS;  Surgeon: Carver Fila, MD;  Location: [facility];  Service: Gynecology;  Laterality: N/A;    ESOPHAGOGASTRODUODENOSCOPY N/A 10/06/2018   Procedure: ESOPHAGOGASTRODUODENOSCOPY (EGD);  Surgeon: Sherrilyn Rist, MD;  Location: [facility] ENDOSCOPY;  Service: Gastroenterology;  Laterality: N/A;   HOT HEMOSTASIS N/A 10/06/2018   Procedure: HOT HEMOSTASIS (ARGON PLASMA COAGULATION/BICAP);  Surgeon: Sherrilyn Rist, MD;  Location: [facility] ENDOSCOPY;  Service: Gastroenterology;  Laterality: N/A;   INTRAUTERINE DEVICE (IUD) INSERTION N/A 03/28/2019   Procedure: INTRAUTERINE DEVICE (IUD) INSERTION;  Surgeon: Carver Fila, MD;  Location: [facility];  Service: Gynecology;  Laterality: N/A;   KNEE SURGERY  1996   Left knee arthroscopy.   LEFT HEART CATHETERIZATION WITH CORONARY ANGIOGRAM N/A 01/24/2014   Procedure: LEFT HEART CATHETERIZATION WITH CORONARY ANGIOGRAM;  Surgeon: Kathleene Hazel, MD;  Location: [facility] CATH LAB;  Service: Cardiovascular;  Laterality: N/A;   ROBOTIC ASSISTED TOTAL HYSTERECTOMY WITH BILATERAL SALPINGO OOPHERECTOMY N/A 09/11/2019   Procedure: XI ROBOTIC ASSISTED TOTAL HYSTERECTOMY WITH BILATERAL SALPINGO OOPHORECTOMY, LYMPH NODE DISSECTION;  Surgeon: Carver Fila, MD;  Location: [facility];  Service: Gynecology;  Laterality: N/A;   ROTATOR CUFF REPAIR     right.   SCLEROTHERAPY  10/06/2018   Procedure: SCLEROTHERAPY;  Surgeon: Sherrilyn Rist, MD;  Location: [facility] ENDOSCOPY;  Service: Gastroenterology;;   SENTINEL NODE BIOPSY N/A 09/11/2019   Procedure: SENTINEL NODE BIOPSY;  Surgeon: Carver Fila, MD;  Location: [facility];  Service: Gynecology;  Laterality: N/A;   TUBAL LIGATION  Allergies  Allergies  Allergen Reactions   Iohexol Rash and Other (See Comments)    Patient stated a rash appeared after a CT scan    Hydrochlorothiazide Other (See Comments)    12/18/21: Palpitations and SOB per the pt - AS     History of Present Illness    Shirley [name]  is a 82 year old female with a PMH of CAD s/p inferior STEMI 01/2014 (c/b VF arrest >> defib in ED)  treated with BMS to RCA, HTN, DVT (Eliquis), prior CVA, GI bleed with gastric ulcer 10/2018 and OSA who presents today for overdue follow-up.  Shirley [name] was seen initially in 2014 and suffered a STEMI in 2015 with 2 episodes of VF in the ED with successful defibrillation and CPR x 2 minutes.  She was taken emergently to the Cath Lab and found to have 99% proximal RCA lesion that was treated with BMS/PTCA x 1.  2D echo was completed showing EF of 55 to 60% with no RWMA and normal valve function.  She has been followed by Dr. Clifton James as her primary cardiologist.  She was most recently seen on 08/05/2021 by Dr. Mayford Knife for evaluation of sleep apnea.  She was found to have controlled blood pressures at the visit and was tolerating her medications without any adverse reactions.  Shirley [name] presents today for overdue follow-up with her daughter.  Since last being seen in the office patient reports no new cardiac complaints.  She does endorse some increased shortness of breath with activity as well as lower extremity edema.  She has of note 2+ lower extremity edema that is not resolved with elevation or compression stockings.  Blood pressures also have been elevated with systolics in the 150s and 140s at home and blood pressure today was 142/78 she reports compliance with her current medication regimen and denies any adverse reactions.  She endorses shoulder pain is chronic in intensity.  Patient denies chest pain, palpitations, dyspnea, PND, orthopnea, nausea, vomiting, dizziness, syncope,  weight gain, or early satiety.   Home Medications    Current Outpatient Medications  Medication Sig Dispense Refill   acetaminophen (TYLENOL) 500 MG tablet Take 500 mg by mouth 2 (two) times daily as needed (for pain).     albuterol (PROVENTIL) (2.5 MG/3ML) 0.083% nebulizer solution SMARTSIG:3 Milliliter(s) Via Inhaler 4 Times Daily PRN 75 mL 4   albuterol (VENTOLIN HFA) 108 (90 Base) MCG/ACT inhaler INHALE  2 PUFFS INTO THE LUNGS EVERY 4 HOURS AS NEEDED FOR WHEEZING OR SHORTNESS OF BREATH. 18 each 6   amLODipine (NORVASC) 10 MG tablet Take 10 mg by mouth daily.     arformoterol (BROVANA) 15 MCG/2ML NEBU TAKE 2 MLS (15 MCG TOTAL) BY NEBULIZATION 2 (TWO) TIMES DAILY. 120 mL 3   atorvastatin (LIPITOR) 80 MG tablet TAKE 1 TABLET BY MOUTH EVERY DAY 90 tablet 1   budesonide (PULMICORT) 0.5 MG/2ML nebulizer solution TAKE 2 ML (0.5 MG TOTAL) BY NEBULIZATION TWICE A DAY 120 mL 6   busPIRone (BUSPAR) 7.5 MG tablet Take 7.5 mg by mouth 2 (two) times daily.     cetirizine (ZYRTEC) 10 MG tablet Take 10 mg by mouth daily.     cholecalciferol (VITAMIN D3) 25 MCG (1000 UNIT) tablet Take 1,000 Units by mouth daily.     ELIQUIS 2.5 MG TABS tablet TAKE 1 TABLET BY MOUTH TWICE A DAY 60 tablet 5   Ferrous Gluconate 239 (27 Fe) MG TABS Take by  mouth.     ferrous sulfate 325 (65 FE) MG tablet Take 325 mg by mouth daily with breakfast.     ibuprofen (ADVIL) 200 MG tablet Take 200 mg by mouth every 6 (six) hours as needed for headache or moderate pain.     lisinopril (ZESTRIL) 40 MG tablet Take 40 mg by mouth daily.     metFORMIN (GLUCOPHAGE-XR) 500 MG 24 hr tablet Take 500 mg by mouth daily with breakfast.     metoprolol succinate (TOPROL-XL) 25 MG 24 hr tablet TAKE 1 TABLET (25 MG TOTAL) BY MOUTH IN THE MORNING AND AT BEDTIME. 180 tablet 1   Multiple Vitamin (MULTIVITAMIN WITH MINERALS) TABS Take 1 tablet by mouth every morning.     MULTIPLE VITAMIN PO 1 tablet Orally Once a day     Naphazoline HCl (CLEAR EYES OP) Place 1 drop into both eyes as needed (for irritation or dryness).      nitroGLYCERIN (NITROSTAT) 0.4 MG SL tablet Place 1 tablet (0.4 mg total) under the tongue every 5 (five) minutes x 3 doses as needed for chest pain. 25 tablet 3   omeprazole (PRILOSEC) 20 MG capsule Take 1 capsule (20 mg total) by mouth every other day. (Patient taking differently: Take 20 mg by mouth daily.) 45 capsule 3   potassium  chloride (KLOR-CON) 10 MEQ tablet Take 1 tablet (10 mEq total) by mouth daily. 90 tablet 1   Pseudoeph-Bromphen-DM (BROMFED DM PO) Take 5 mLs by mouth 3 (three) times daily.     sodium chloride (OCEAN) 0.65 % SOLN nasal spray Place 1 spray into both nostrils as needed for congestion.     Spacer/Aero-Holding Chambers (AEROCHAMBER MV) inhaler Use as instructed 1 each 0   telmisartan (MICARDIS) 80 MG tablet Take 80 mg by mouth daily.     furosemide (LASIX) 40 MG tablet Take 1 tablet (40 mg total) by mouth daily. 30 tablet 2   No current facility-administered medications for this visit.     Review of Systems  Please see the history of present illness.    (+) Lower extremity edema  (+) Shortness of breath and chronic shoulder pain  All other systems reviewed and are otherwise negative except as noted above.  Physical Exam    Wt Readings from Last 3 Encounters:  09/22/22 252 lb 3.2 oz (114.4 kg)  06/29/22 249 lb (112.9 kg)  03/08/22 252 lb (114.3 kg)   VS: Vitals:   09/22/22 1507  BP: (!) 142/78  Pulse: 66  SpO2: 99%  ,Body mass index is 44.68 kg/m.  Constitutional:      Appearance: Healthy appearance. Not in distress.  Neck:     Vascular: JVD normal.  Pulmonary:     Effort: Pulmonary effort is normal.     Breath sounds: No wheezing. No rales. Diminished in the bases Cardiovascular:     Normal rate. Regular rhythm. Normal S1. Normal S2.      Murmurs: There is no murmur.  Edema:    Peripheral edema absent.  Abdominal:     Palpations: Abdomen is soft non tender. There is no hepatomegaly.  Skin:    General: Skin is warm and dry.  Neurological:     General: No focal deficit present.     Mental Status: Alert and oriented to person, place and time.     Cranial Nerves: Cranial nerves are intact.  EKG/LABS/ Recent Cardiac Studies    ECG personally reviewed by me today -sinus rhythm with first-degree AV block and  left anterior fascicular block with left axis deviation with  rate of 67 bpm and no acute changes  Cardiac Studies & Procedures     STRESS TESTS  MYOCARDIAL PERFUSION IMAGING 09/27/2017  Narrative  Nuclear stress EF: 76%. No wall motion abnormalities  There was no ST segment deviation noted during stress.  Defect 1: There is a small defect of mild severity present in the apical lateral and apex location. No ischemia identified  Overall low risk study with no ischemia identified. Small apical lateral fixed defect consistent with old infarct. Old inferior ST elevation myocardial infarction by history.  Donato Schultz, MD   ECHOCARDIOGRAM  ECHOCARDIOGRAM COMPLETE 08/07/2020  Narrative ECHOCARDIOGRAM REPORT    Patient Name:   SOPHONIE ARING DE PAULA Date of Exam: 08/07/2020 Medical Rec #:  [id number]             Height:       63.0 in Accession #:    [id number]            Weight:       257.0 lb Date of Birth:  08-Sep-1940             BSA:          2.151 m Patient Age:    82 years              BP:           110/58 mmHg Patient Gender: F                     HR:           59 bpm. Exam Location:  [address]  Procedure: 2D Echo, Cardiac Doppler, Color Doppler and Intracardiac Opacification Agent  Indications:    I50.31 Acute Diastolic heart failure  History:        Patient has prior history of Echocardiogram examinations, most recent 01/24/2014. Risk Factors:Hypertension, Dyslipidemia and Pre-Diabetes. Stroke. STEMI. Syncope. Coronary artery disease. DVT. Obstructive sleep apnea-CPAP.  Sonographer:    Sedonia Small Rodgers-Jones RDCS Referring Phys: [id number] MICHELE M LENZE  IMPRESSIONS   1. Left ventricular ejection fraction, by estimation, is 60 to 65%. The left ventricle has normal function. The left ventricle has no regional wall motion abnormalities. Left ventricular diastolic parameters are consistent with Grade II diastolic dysfunction (pseudonormalization). 2. Right ventricular systolic function is normal. The right ventricular size is  normal. 3. The mitral valve is normal in structure. No evidence of mitral valve regurgitation. No evidence of mitral stenosis. 4. The aortic valve is normal in structure. Aortic valve regurgitation is not visualized. No aortic stenosis is present. 5. There is mild dilatation of the ascending aorta, measuring 43 mm. 6. The inferior vena cava is normal in size with greater than 50% respiratory variability, suggesting right atrial pressure of 3 mmHg.  Comparison(s): Prior images unable to be directly viewed, comparison made by report only.  FINDINGS Left Ventricle: Left ventricular ejection fraction, by estimation, is 60 to 65%. The left ventricle has normal function. The left ventricle has no regional wall motion abnormalities. Definity contrast agent was given IV to delineate the left ventricular endocardial borders. The left ventricular internal cavity size was normal in size. There is no left ventricular hypertrophy. Left ventricular diastolic parameters are consistent with Grade II diastolic dysfunction (pseudonormalization). Indeterminate filling pressures.  Right Ventricle: The right ventricular size is normal. No increase in right ventricular wall thickness. Right ventricular systolic function is normal.  Left Atrium: Left  atrial size was normal in size.  Right Atrium: Right atrial size was normal in size.  Pericardium: There is no evidence of pericardial effusion.  Mitral Valve: The mitral valve is normal in structure. No evidence of mitral valve regurgitation. No evidence of mitral valve stenosis.  Tricuspid Valve: The tricuspid valve is normal in structure. Tricuspid valve regurgitation is not demonstrated. No evidence of tricuspid stenosis.  Aortic Valve: The aortic valve is normal in structure. Aortic valve regurgitation is not visualized. No aortic stenosis is present.  Pulmonic Valve: The pulmonic valve was normal in structure. Pulmonic valve regurgitation is not visualized. No  evidence of pulmonic stenosis.  Aorta: The aortic root is normal in size and structure. There is mild dilatation of the ascending aorta, measuring 43 mm.  Venous: The inferior vena cava is normal in size with greater than 50% respiratory variability, suggesting right atrial pressure of 3 mmHg.  IAS/Shunts: No atrial level shunt detected by color flow Doppler.   LEFT VENTRICLE PLAX 2D LVIDd:         4.50 cm  Diastology LVIDs:         3.10 cm  LV e' medial:    7.18 cm/s LV PW:         0.90 cm  LV E/e' medial:  15.0 LV IVS:        0.90 cm  LV e' lateral:   8.70 cm/s LVOT diam:     2.20 cm  LV E/e' lateral: 12.4 LV SV:         94 LV SV Index:   44 LVOT Area:     3.80 cm   RIGHT VENTRICLE             IVC RV Basal diam:  4.00 cm     IVC diam: 1.50 cm RV S prime:     14.20 cm/s TAPSE (M-mode): 2.6 cm  LEFT ATRIUM             Index       RIGHT ATRIUM           Index LA diam:        4.20 cm 1.95 cm/m  RA Area:     12.70 cm LA Vol (A2C):   72.2 ml 33.56 ml/m RA Volume:   32.60 ml  15.16 ml/m LA Vol (A4C):   41.0 ml 19.06 ml/m LA Biplane Vol: 59.0 ml 27.43 ml/m AORTIC VALVE LVOT Vmax:   99.80 cm/s LVOT Vmean:  71.900 cm/s LVOT VTI:    0.247 m  AORTA Ao Root diam: 3.80 cm Ao Asc diam:  4.30 cm  MITRAL VALVE MV Area (PHT): 2.66 cm     SHUNTS MV Decel Time: 285 msec     Systemic VTI:  0.25 m MV E velocity: 108.00 cm/s  Systemic Diam: 2.20 cm MV A velocity: 95.60 cm/s MV E/A ratio:  1.13  Mihai Croitoru MD Electronically signed by Thurmon Fair MD Signature Date/Time: 08/07/2020/10:27:46 AM    Final             Risk Assessment/Calculations:    CHA2DS2-VASc Score = 8  {Click here to calculate score.  REFRESH note before signing. :1} This indicates a 10.8% annual risk of stroke. The patient's score is based upon: CHF History: 1 HTN History: 1 Diabetes History: 0 Stroke History: 2 Vascular Disease History: 1 Age Score: 2 Gender Score: 1   {This patient has a  significant risk of stroke if diagnosed with atrial fibrillation.  Please consider VKA or DOAC agent for anticoagulation if the bleeding risk is acceptable.   You can also use the SmartPhrase .[email or web address] for documentation.   :[id number]}        Lab Results  Component Value Date   WBC 9.6 06/29/2022   HGB 13.9 06/29/2022   HCT 41.2 06/29/2022   MCV 85.4 06/29/2022   PLT 242.0 06/29/2022   Lab Results  Component Value Date   CREATININE 0.68 12/18/2021   BUN 15 12/18/2021   NA 137 12/18/2021   K 3.9 12/18/2021   CL 102 12/18/2021   CO2 29 12/18/2021   Lab Results  Component Value Date   ALT 29 12/18/2021   AST 20 12/18/2021   ALKPHOS 107 12/18/2021   BILITOT 1.1 12/18/2021   Lab Results  Component Value Date   CHOL 129 10/17/2019   HDL 47 10/17/2019   LDLCALC 71 10/17/2019   TRIG 50 10/17/2019   CHOLHDL 2.7 10/17/2019    Lab Results  Component Value Date   HGBA1C 6.2 (H) 09/05/2019     Assessment & Plan    1.  Coronary artery disease: -s/p STEMI 2015 with VF and defibrillation x 2 treated with BMS to proximal RCA -Today patient reports reports no acute changes or chronic anginal complaints. -Continue GDMT with Lipitor 80 mg daily, Toprol 25 mg twice daily  2.  Essential hypertension: -Patient's blood pressure today is elevated at 142/78 and was 170/84 on recheck. -We will have her monitor blood pressures for 2 weeks and based on results we will make appropriate changes if needed. -Continue Dash type diet  3.  Hyperlipidemia: -Patient/LDL cholesterol was 116 above goal of less than 70 -Continue Lipitor 80 mg daily  4.  History of CVA/DVT: -She was diagnosed 12/2013 -Patient currently on lifelong AC with Eliquis  5.  Obstructive sleep apnea: -Patient is currently not compliant with CPAP due to fluid that she feels accumulates on her lungs when using it. -We discussed the importance of resuming CPAP if possible.  6.  SOB/lower extremity edema: -Today  patient reports ongoing shortness of breath and lower extremity edema that has worsened over the past few months. -She has 2+ lower extremity edema on the right and 3+ on the left. -We will increase Lasix to 40 mg daily -We will check BMP and BMET today -We will also recheck 2D echo to evaluate for structural heart changes and valvular disease -Low sodium diet, fluid restriction <2L, and daily weights encouraged. Educated to contact our office for weight gain of 2 lbs overnight or 5 lbs in one week.       Disposition: Follow-up with Verne Carrow, MD or APP in *** months {Are you ordering a CV Procedure (e.g. stress test, cath, DCCV, TEE, etc)?   Press F2        :[id number]}   Medication Adjustments/Labs and Tests Ordered: Current medicines are reviewed at length with the patient today.  Concerns regarding medicines are outlined above.   Signed, Napoleon Form, Leodis Rains, NP 09/22/2022, 4:24 PM [facility]

## 2022-09-22 ENCOUNTER — Ambulatory Visit: Payer: Medicare Other | Attending: Nurse Practitioner | Admitting: Nurse Practitioner

## 2022-09-22 ENCOUNTER — Encounter: Payer: Self-pay | Admitting: Nurse Practitioner

## 2022-09-22 VITALS — BP 142/78 | HR 66 | Ht 63.0 in | Wt 252.2 lb

## 2022-09-22 DIAGNOSIS — G4733 Obstructive sleep apnea (adult) (pediatric): Secondary | ICD-10-CM

## 2022-09-22 DIAGNOSIS — R600 Localized edema: Secondary | ICD-10-CM

## 2022-09-22 DIAGNOSIS — Z8673 Personal history of transient ischemic attack (TIA), and cerebral infarction without residual deficits: Secondary | ICD-10-CM | POA: Diagnosis not present

## 2022-09-22 DIAGNOSIS — R0602 Shortness of breath: Secondary | ICD-10-CM | POA: Diagnosis not present

## 2022-09-22 DIAGNOSIS — G459 Transient cerebral ischemic attack, unspecified: Secondary | ICD-10-CM | POA: Diagnosis not present

## 2022-09-22 DIAGNOSIS — E7800 Pure hypercholesterolemia, unspecified: Secondary | ICD-10-CM

## 2022-09-22 DIAGNOSIS — I2510 Atherosclerotic heart disease of native coronary artery without angina pectoris: Secondary | ICD-10-CM

## 2022-09-22 MED ORDER — FUROSEMIDE 40 MG PO TABS: 40.0000 mg | ORAL_TABLET | Freq: Every day | ORAL | 2 refills | Status: DC

## 2022-09-22 NOTE — Patient Instructions (Addendum)
Medication Instructions:  INCREASE Lasix to 40mg  take 1 tablet once a day  *If you need a refill on your cardiac medications before your next appointment, please call your pharmacy*   Lab Work: TODAY-BMET & BNP  If you have labs (blood work) drawn today and your tests are completely normal, you will receive your results only by: MyChart Message (if you have MyChart) OR A paper copy in the mail If you have any lab test that is abnormal or we need to change your treatment, we will call you to review the results.   Testing/Procedures: Your physician has requested that you have an echocardiogram. Echocardiography is a painless test that uses sound waves to create images of your heart. It provides your doctor with information about the size and shape of your heart and how well your heart's chambers and valves are working. This procedure takes approximately one hour. There are no restrictions for this procedure. Please do NOT wear cologne, perfume, aftershave, or lotions (deodorant is allowed). Please arrive 15 minutes prior to your appointment time.    Follow-Up: At [facility], you and your health needs are our priority.  As part of our continuing mission to provide you with exceptional heart care, we have created designated Provider Care Teams.  These Care Teams include your primary Cardiologist (physician) and Advanced Practice Providers (APPs -  Physician Assistants and Nurse Practitioners) who all work together to provide you with the care you need, when you need it.  We recommend signing up for the patient portal called "MyChart".  Sign up information is provided on this After Visit Summary.  MyChart is used to connect with patients for Virtual Visits (Telemedicine).  Patients are able to view lab/test results, encounter notes, upcoming appointments, etc.  Non-urgent messages can be sent to your provider as well.   To learn more about what you can do with MyChart, go to  [email or web address].    Your next appointment:   1 month(s)  Provider:   Robin Searing, NP       Other Instructions Check your blood pressure daily for 2 weeks, then contact the office with your readings.  Please check your weight daily. Please contact the office if you gain more than 3lbs in a day or 5lbs in a week. Limit your salt intake to 1500-2000mg  per day or 500mg  of Sodium per meal.

## 2022-09-23 LAB — PRO B NATRIURETIC PEPTIDE: NT-Pro BNP: 191 pg/mL (ref 0–738)

## 2022-09-23 LAB — BASIC METABOLIC PANEL
BUN/Creatinine Ratio: 18 (ref 12–28)
BUN: 12 mg/dL (ref 8–27)
CO2: 22 mmol/L (ref 20–29)
Calcium: 9.4 mg/dL (ref 8.7–10.3)
Chloride: 102 mmol/L (ref 96–106)
Creatinine, Ser: 0.68 mg/dL (ref 0.57–1.00)
Glucose: 117 mg/dL — ABNORMAL HIGH (ref 70–99)
Potassium: 4.5 mmol/L (ref 3.5–5.2)
Sodium: 137 mmol/L (ref 134–144)
eGFR: 87 mL/min/{1.73_m2} (ref >59)

## 2022-10-08 ENCOUNTER — Telehealth: Payer: Self-pay | Admitting: Cardiovascular Disease

## 2022-10-08 MED ORDER — NITROGLYCERIN 0.4 MG SL SUBL: 0.4000 mg | SUBLINGUAL_TABLET | SUBLINGUAL | 3 refills | Status: AC | PRN

## 2022-10-08 NOTE — Telephone Encounter (Signed)
Called daughter advised that prescription for NTG has not been refilled since 2019.  Reports pt has not had CP but wants to have the medication just in case. Advised [facility]ill [facility] to [facility] on [facility]. No further concerns at this time.

## 2022-10-08 NOTE — Telephone Encounter (Signed)
Patient's daughter called and said they pharmacy that they always go to do not have nitroGLYCERIN (NITROSTAT) 0.4 MG SL tablet . Mentions that she only wants to go to the pharmacy on [id number] - [place], [place] - [address]. [facility] [address]

## 2022-10-22 ENCOUNTER — Ambulatory Visit (HOSPITAL_COMMUNITY): Payer: Medicare Other | Attending: Nurse Practitioner

## 2022-10-22 DIAGNOSIS — E7800 Pure hypercholesterolemia, unspecified: Secondary | ICD-10-CM | POA: Diagnosis not present

## 2022-10-22 DIAGNOSIS — I2510 Atherosclerotic heart disease of native coronary artery without angina pectoris: Secondary | ICD-10-CM | POA: Diagnosis not present

## 2022-10-22 DIAGNOSIS — G459 Transient cerebral ischemic attack, unspecified: Secondary | ICD-10-CM

## 2022-10-22 DIAGNOSIS — G4733 Obstructive sleep apnea (adult) (pediatric): Secondary | ICD-10-CM | POA: Diagnosis not present

## 2022-10-22 DIAGNOSIS — Z8673 Personal history of transient ischemic attack (TIA), and cerebral infarction without residual deficits: Secondary | ICD-10-CM

## 2022-10-22 LAB — ECHOCARDIOGRAM COMPLETE
Area-P 1/2: 3.42 cm2
S' Lateral: 2.1 cm

## 2022-10-22 MED ORDER — PERFLUTREN LIPID MICROSPHERE
1.0000 mL | INTRAVENOUS | Status: AC | PRN
Start: 2022-10-22 — End: 2022-10-22
  Administered 2022-10-22: 3 mL via INTRAVENOUS

## 2022-10-24 ENCOUNTER — Other Ambulatory Visit: Payer: Self-pay | Admitting: Cardiovascular Disease

## 2022-11-05 ENCOUNTER — Ambulatory Visit: Payer: Medicare Other | Admitting: Nurse Practitioner

## 2022-11-07 NOTE — Progress Notes (Deleted)
Office Visit    Patient Name: Shirley [name] Date of Encounter: 11/07/2022  Primary Care Provider:  Georgianne Fick, MD Primary Cardiologist:  Verne Carrow, MD Primary Electrophysiologist: None   Past Medical History    Past Medical History:  Diagnosis Date   BMI 40.0-44.9, adult (HCC)    CAD in native artery, stent to RCA BMS with MI 2015 11/04/2003   a. inferior STEMI 01/2014 s/p BMS to prox RCA.   DVT (deep venous thrombosis) (HCC) 2003   lower extremity DVT with questionable recurrence in 2005. Another admission 2015.   Endometrial cancer (HCC) 2021   Endometrial thickening on ultra sound 07/2009   path showing Fragments of benign endocervical and squamous mucosa. No dysplasia or malignancy // Followed by Dr. Miguel Aschoff   First degree AV block    Gastric ulcer    GERD (gastroesophageal reflux disease)    GI bleed    Hemorrhoid    Hyperlipidemia LDL goal <70    Hypertension    Insomnia 11/02/2010   Memory difficulties 11/02/2010   Mild dilation of ascending aorta (HCC)    Morbid obesity (HCC) 09/16/2012   Obstructive sleep apnea 09/23/2010   Pre-diabetes    Pulmonary nodule    ST elevation myocardial infarction (STEMI) of inferior wall (HCC) 01/24/2014   Stroke (HCC) 2015   Syncope 12/18/2013   Syncope, near 12/14/2018   TIA (transient ischemic attack) 09/23/2010   09/13/2010: CT, MRI/MRA no acute process. Carotid doppler: no stenosis. 2D-Echo Normal LV function with an EF 55-60%.    Trifascicular block    UTI (urinary tract infection) 01/27/2014   Past Surgical History:  Procedure Laterality Date   COLONOSCOPY W/ POLYPECTOMY  11/01/2007   8 mm rectal adenoma, hemorrhoids   CORONARY ANGIOPLASTY WITH STENT PLACEMENT  01/24/14   Inf. STEMI, BMS to RCA   DILATION AND CURETTAGE OF UTERUS N/A 03/28/2019   Procedure: DILATATION AND CURETTAGE OF UTERUS;  Surgeon: Carver Fila, MD;  Location: [facility];  Service: Gynecology;  Laterality: N/A;    ESOPHAGOGASTRODUODENOSCOPY N/A 10/06/2018   Procedure: ESOPHAGOGASTRODUODENOSCOPY (EGD);  Surgeon: Sherrilyn Rist, MD;  Location: [facility] ENDOSCOPY;  Service: Gastroenterology;  Laterality: N/A;   HOT HEMOSTASIS N/A 10/06/2018   Procedure: HOT HEMOSTASIS (ARGON PLASMA COAGULATION/BICAP);  Surgeon: Sherrilyn Rist, MD;  Location: [facility] ENDOSCOPY;  Service: Gastroenterology;  Laterality: N/A;   INTRAUTERINE DEVICE (IUD) INSERTION N/A 03/28/2019   Procedure: INTRAUTERINE DEVICE (IUD) INSERTION;  Surgeon: Carver Fila, MD;  Location: [facility];  Service: Gynecology;  Laterality: N/A;   KNEE SURGERY  1996   Left knee arthroscopy.   LEFT HEART CATHETERIZATION WITH CORONARY ANGIOGRAM N/A 01/24/2014   Procedure: LEFT HEART CATHETERIZATION WITH CORONARY ANGIOGRAM;  Surgeon: Kathleene Hazel, MD;  Location: [facility] CATH LAB;  Service: Cardiovascular;  Laterality: N/A;   ROBOTIC ASSISTED TOTAL HYSTERECTOMY WITH BILATERAL SALPINGO OOPHERECTOMY N/A 09/11/2019   Procedure: XI ROBOTIC ASSISTED TOTAL HYSTERECTOMY WITH BILATERAL SALPINGO OOPHORECTOMY, LYMPH NODE DISSECTION;  Surgeon: Carver Fila, MD;  Location: [facility];  Service: Gynecology;  Laterality: N/A;   ROTATOR CUFF REPAIR     right.   SCLEROTHERAPY  10/06/2018   Procedure: SCLEROTHERAPY;  Surgeon: Sherrilyn Rist, MD;  Location: [facility] ENDOS[facility];  Service: Gastroenterology;;   SENTINEL NODE BIOPSY N/A 09/11/2019   Procedure: SENTINEL NODE BIOPSY;  Surgeon: Carver Fila, MD;  Location: [facility];  Service: Gynecology;  Laterality: N/A;   TUBAL LIGATION  Allergies  Allergies  Allergen Reactions   Iohexol Rash and Other (See Comments)    Patient stated a rash appeared after a CT scan    Hydrochlorothiazide Other (See Comments)    12/18/21: Palpitations and SOB per the pt - AS     History of Present Illness   Shirley [name]  is a 82 year old female with a PMH of CAD s/p inferior STEMI 01/2014 (c/b VF arrest >> defib in ED) treated  with BMS to RCA, HTN, DVT (Eliquis), prior CVA, GI bleed with gastric ulcer 10/2018 and OSA who presents today for overdue follow-up.   Shirley [name] was seen initially in 2014 and suffered a STEMI in 2015 with 2 episodes of VF in the ED with successful defibrillation and CPR x 2 minutes.  She was taken emergently to the Cath Lab and found to have 99% proximal RCA lesion that was treated with BMS/PTCA x 1.  She was last seen 09/22/2022 and endorsed some shortness of breath with lower extremity edema.  Lasix was increased and patient underwent updated 2D echo that showed EF of 60 to 65% with no RWMA and grade 1 DD with no significant valvular abnormalities.   Since last being seen in the office patient reports***.  Patient denies chest pain, palpitations, dyspnea, PND, orthopnea, nausea, vomiting, dizziness, syncope, edema, weight gain, or early satiety.     ***Notes:  Home Medications    Current Outpatient Medications  Medication Sig Dispense Refill   acetaminophen (TYLENOL) 500 MG tablet Take 500 mg by mouth 2 (two) times daily as needed (for pain).     albuterol (PROVENTIL) (2.5 MG/3ML) 0.083% nebulizer solution SMARTSIG:3 Milliliter(s) Via Inhaler 4 Times Daily PRN 75 mL 4   albuterol (VENTOLIN HFA) 108 (90 Base) MCG/ACT inhaler INHALE 2 PUFFS INTO THE LUNGS EVERY 4 HOURS AS NEEDED FOR WHEEZING OR SHORTNESS OF BREATH. 18 each 6   amLODipine (NORVASC) 10 MG tablet Take 10 mg by mouth daily.     arformoterol (BROVANA) 15 MCG/2ML NEBU TAKE 2 MLS (15 MCG TOTAL) BY NEBULIZATION 2 (TWO) TIMES DAILY. 120 mL 3   atorvastatin (LIPITOR) 80 MG tablet TAKE 1 TABLET BY MOUTH EVERY DAY 90 tablet 1   budesonide (PULMICORT) 0.5 MG/2ML nebulizer solution TAKE 2 ML (0.5 MG TOTAL) BY NEBULIZATION TWICE A DAY 120 mL 6   busPIRone (BUSPAR) 7.5 MG tablet Take 7.5 mg by mouth 2 (two) times daily.     cetirizine (ZYRTEC) 10 MG tablet Take 10 mg by mouth daily.     cholecalciferol (VITAMIN D3) 25 MCG (1000  UNIT) tablet Take 1,000 Units by mouth daily.     ELIQUIS 2.5 MG TABS tablet TAKE 1 TABLET BY MOUTH TWICE A DAY 60 tablet 5   Ferrous Gluconate 239 (27 Fe) MG TABS Take by mouth.     ferrous sulfate 325 (65 FE) MG tablet Take 325 mg by mouth daily with breakfast.     furosemide (LASIX) 20 MG tablet TAKE 1 TABLET BY MOUTH EVERY DAY 90 tablet 3   furosemide (LASIX) 40 MG tablet Take 1 tablet (40 mg total) by mouth daily. 30 tablet 2   ibuprofen (ADVIL) 200 MG tablet Take 200 mg by mouth every 6 (six) hours as needed for headache or moderate pain.     lisinopril (ZESTRIL) 40 MG tablet Take 40 mg by mouth daily.     metFORMIN (GLUCOPHAGE-XR) 500 MG 24 hr tablet Take 500 mg by mouth  daily with breakfast.     metoprolol succinate (TOPROL-XL) 25 MG 24 hr tablet TAKE 1 TABLET (25 MG TOTAL) BY MOUTH IN THE MORNING AND AT BEDTIME. 180 tablet 1   Multiple Vitamin (MULTIVITAMIN WITH MINERALS) TABS Take 1 tablet by mouth every morning.     MULTIPLE VITAMIN PO 1 tablet Orally Once a day     Naphazoline HCl (CLEAR EYES OP) Place 1 drop into both eyes as needed (for irritation or dryness).      nitroGLYCERIN (NITROSTAT) 0.4 MG SL tablet Place 1 tablet (0.4 mg total) under the tongue every 5 (five) minutes x 3 doses as needed for chest pain. 25 tablet 3   omeprazole (PRILOSEC) 20 MG capsule Take 1 capsule (20 mg total) by mouth every other day. (Patient taking differently: Take 20 mg by mouth daily.) 45 capsule 3   potassium chloride (KLOR-CON) 10 MEQ tablet Take 1 tablet (10 mEq total) by mouth daily. 90 tablet 1   Pseudoeph-Bromphen-DM (BROMFED DM PO) Take 5 mLs by mouth 3 (three) times daily.     sodium chloride (OCEAN) 0.65 % SOLN nasal spray Place 1 spray into both nostrils as needed for congestion.     Spacer/Aero-Holding Chambers (AEROCHAMBER MV) inhaler Use as instructed 1 each 0   telmisartan (MICARDIS) 80 MG tablet Take 80 mg by mouth daily.     No current facility-administered medications for this  visit.     Review of Systems  Please see the history of present illness.    (+)*** (+)***  All other systems reviewed and are otherwise negative except as noted above.  Physical Exam    Wt Readings from Last 3 Encounters:  09/22/22 252 lb 3.2 oz (114.4 kg)  06/29/22 249 lb (112.9 kg)  03/08/22 252 lb (114.3 kg)   ZO:[id number] were no vitals filed for this visit.,There is no height or weight on file to calculate BMI.  Constitutional:      Appearance: Healthy appearance. Not in distress.  Neck:     Vascular: JVD normal.  Pulmonary:     Effort: Pulmonary effort is normal.     Breath sounds: No wheezing. No rales. Diminished in the bases Cardiovascular:     Normal rate. Regular rhythm. Normal S1. Normal S2.      Murmurs: There is no murmur.  Edema:    Peripheral edema absent.  Abdominal:     Palpations: Abdomen is soft non tender. There is no hepatomegaly.  Skin:    General: Skin is warm and dry.  Neurological:     General: No focal deficit present.     Mental Status: Alert and oriented to person, place and time.     Cranial Nerves: Cranial nerves are intact.  EKG/LABS/ Recent Cardiac Studies    ECG personally reviewed by me today - ***   Risk Assessment/Calculations:   {Does this patient have ATRIAL FIBRILLATION?[phone]}        Lab Results  Component Value Date   WBC 9.6 06/29/2022   HGB 13.9 06/29/2022   HCT 41.2 06/29/2022   MCV 85.4 06/29/2022   PLT 242.0 06/29/2022   Lab Results  Component Value Date   CREATININE 0.68 09/22/2022   BUN 12 09/22/2022   NA 137 09/22/2022   K 4.5 09/22/2022   CL 102 09/22/2022   CO2 22 09/22/2022   Lab Results  Component Value Date   ALT 29 12/18/2021   AST 20 12/18/2021   ALKPHOS 107 12/18/2021   BILITOT 1.1 12/18/2021  Lab Results  Component Value Date   CHOL 129 10/17/2019   HDL 47 10/17/2019   LDLCALC 71 10/17/2019   TRIG 50 10/17/2019   CHOLHDL 2.7 10/17/2019    Lab Results  Component Value Date    HGBA1C 6.2 (H) 09/05/2019     Assessment & Plan    1.  Coronary artery disease: -s/p STEMI 2015 with VF and defibrillation x 2 treated with BMS to proximal RCA -Today patient reports   2.  Essential hypertension: -Patient blood pressure today was*** -Continue***  3.  SOB/lower extremity edema: -Patient 2D echo completed showing EF of 60 to 65% with no RWMA and no significant valvular abnormalities -Today patient is*** -Continue***  4.History of CVA/DVT: -She was diagnosed 12/2013 -Patient currently on lifelong AC with Eliquis  5. Hyperlipidemia: -Patient's LDL cholesterol was*** -Continue***      Disposition: Follow-up with Verne Carrow, MD or APP in *** months {Are you ordering a CV Procedure (e.g. stress test, cath, DCCV, TEE, etc)?   Press F2        :[id number]}   Medication Adjustments/Labs and Tests Ordered: Current medicines are reviewed at length with the patient today.  Concerns regarding medicines are outlined above.   Signed, Napoleon Form, Leodis Rains, NP 11/07/2022, 2:04 PM [facility]

## 2022-11-08 ENCOUNTER — Ambulatory Visit: Payer: Medicare Other | Attending: Nurse Practitioner | Admitting: Nurse Practitioner

## 2022-11-08 ENCOUNTER — Encounter: Payer: Self-pay | Admitting: Nurse Practitioner

## 2022-11-08 DIAGNOSIS — Z8673 Personal history of transient ischemic attack (TIA), and cerebral infarction without residual deficits: Secondary | ICD-10-CM

## 2022-11-08 DIAGNOSIS — I2510 Atherosclerotic heart disease of native coronary artery without angina pectoris: Secondary | ICD-10-CM

## 2022-11-08 DIAGNOSIS — I10 Essential (primary) hypertension: Secondary | ICD-10-CM

## 2022-11-08 DIAGNOSIS — G4733 Obstructive sleep apnea (adult) (pediatric): Secondary | ICD-10-CM

## 2022-11-08 DIAGNOSIS — E7800 Pure hypercholesterolemia, unspecified: Secondary | ICD-10-CM

## 2022-11-25 DIAGNOSIS — D6869 Other thrombophilia: Secondary | ICD-10-CM | POA: Diagnosis not present

## 2022-11-25 DIAGNOSIS — D692 Other nonthrombocytopenic purpura: Secondary | ICD-10-CM | POA: Diagnosis not present

## 2022-11-25 DIAGNOSIS — E1142 Type 2 diabetes mellitus with diabetic polyneuropathy: Secondary | ICD-10-CM | POA: Diagnosis not present

## 2022-11-25 DIAGNOSIS — I700 Atherosclerosis of aorta: Secondary | ICD-10-CM | POA: Diagnosis not present

## 2022-11-27 LAB — LAB REPORT - SCANNED
A1c: 7.2
EGFR: 88

## 2022-12-02 DIAGNOSIS — J4540 Moderate persistent asthma, uncomplicated: Secondary | ICD-10-CM | POA: Diagnosis not present

## 2022-12-02 DIAGNOSIS — N182 Chronic kidney disease, stage 2 (mild): Secondary | ICD-10-CM | POA: Diagnosis not present

## 2022-12-02 DIAGNOSIS — E1142 Type 2 diabetes mellitus with diabetic polyneuropathy: Secondary | ICD-10-CM | POA: Diagnosis not present

## 2022-12-02 DIAGNOSIS — R600 Localized edema: Secondary | ICD-10-CM | POA: Diagnosis not present

## 2022-12-02 DIAGNOSIS — I700 Atherosclerosis of aorta: Secondary | ICD-10-CM | POA: Diagnosis not present

## 2022-12-02 DIAGNOSIS — D692 Other nonthrombocytopenic purpura: Secondary | ICD-10-CM | POA: Diagnosis not present

## 2022-12-02 DIAGNOSIS — M150 Primary generalized (osteo)arthritis: Secondary | ICD-10-CM | POA: Diagnosis not present

## 2022-12-02 DIAGNOSIS — E782 Mixed hyperlipidemia: Secondary | ICD-10-CM | POA: Diagnosis not present

## 2022-12-02 DIAGNOSIS — D6869 Other thrombophilia: Secondary | ICD-10-CM | POA: Diagnosis not present

## 2022-12-02 DIAGNOSIS — I10 Essential (primary) hypertension: Secondary | ICD-10-CM | POA: Diagnosis not present

## 2022-12-02 DIAGNOSIS — D6859 Other primary thrombophilia: Secondary | ICD-10-CM | POA: Diagnosis not present

## 2022-12-15 NOTE — Progress Notes (Unsigned)
Cardiology Office Note    Patient Name: Shirley [name] Date of Encounter: 12/15/2022  Primary Care Provider:  Georgianne Fick, MD Primary Cardiologist:  Verne Carrow, MD Primary Electrophysiologist: None   Past Medical History    Past Medical History:  Diagnosis Date   BMI 40.0-44.9, adult (HCC)    CAD in native artery, stent to RCA BMS with MI 2015 11/04/2003   a. inferior STEMI 01/2014 s/p BMS to prox RCA.   DVT (deep venous thrombosis) (HCC) 2003   lower extremity DVT with questionable recurrence in 2005. Another admission 2015.   Endometrial cancer (HCC) 2021   Endometrial thickening on ultra sound 07/2009   path showing Fragments of benign endocervical and squamous mucosa. No dysplasia or malignancy // Followed by Dr. Miguel Aschoff   First degree AV block    Gastric ulcer    GERD (gastroesophageal reflux disease)    GI bleed    Hemorrhoid    Hyperlipidemia LDL goal <70    Hypertension    Insomnia 11/02/2010   Memory difficulties 11/02/2010   Mild dilation of ascending aorta (HCC)    Morbid obesity (HCC) 09/16/2012   Obstructive sleep apnea 09/23/2010   Pre-diabetes    Pulmonary nodule    ST elevation myocardial infarction (STEMI) of inferior wall (HCC) 01/24/2014   Stroke (HCC) 2015   Syncope 12/18/2013   Syncope, near 12/14/2018   TIA (transient ischemic attack) 09/23/2010   09/13/2010: CT, MRI/MRA no acute process. Carotid doppler: no stenosis. 2D-Echo Normal LV function with an EF 55-60%.    Trifascicular block    UTI (urinary tract infection) 01/27/2014    History of Present Illness   Shirley [name]  is a 82 year old female with a PMH of CAD s/p inferior STEMI 01/2014 (c/b VF arrest >> defib in ED) treated with BMS to RCA, HTN, DVT (Eliquis), prior CVA, GI bleed with gastric ulcer 10/2018 and OSA who presents today for overdue follow-up.   Shirley [name] was seen initially in 2014 and suffered a STEMI in 2015 with 2 episodes of VF  in the ED with successful defibrillation and CPR x 2 minutes.  She was taken emergently to the Cath Lab and found to have 99% proximal RCA lesion that was treated with BMS/PTCA x 1.  She was last seen 09/22/2022 and endorsed some shortness of breath with lower extremity edema.  Lasix was increased and patient underwent updated 2D echo that showed EF of 60 to 65% with no RWMA and grade 1 DD with no significant valvular abnormalities.   Since last being seen in the office patient reports***.  Patient denies chest pain, palpitations, dyspnea, PND, orthopnea, nausea, vomiting, dizziness, syncope, edema, weight gain, or early satiety.  ***Notes: -Last ischemic evaluation: -Last echo: -Interim ED visits: Review of Systems  Please see the history of present illness.    All other systems reviewed and are otherwise negative except as noted above.  Physical Exam    Wt Readings from Last 3 Encounters:  09/22/22 252 lb 3.2 oz (114.4 kg)  06/29/22 249 lb (112.9 kg)  03/08/22 252 lb (114.3 kg)   UJ:[id number] were no vitals filed for this visit.,There is no height or weight on file to calculate BMI. GEN: Well nourished, well developed in no acute distress Neck: No JVD; No carotid bruits Pulmonary: Clear to auscultation without rales, wheezing or rhonchi  Cardiovascular: Normal rate. Regular rhythm. Normal S1. Normal S2.   Murmurs: There  is no murmur.  ABDOMEN: Soft, non-tender, non-distended EXTREMITIES:  No edema; No deformity   EKG/LABS/ Recent Cardiac Studies   ECG personally reviewed by me today - ***  Risk Assessment/Calculations:   {Does this patient have ATRIAL FIBRILLATION?:[phone]}      Lab Results  Component Value Date   WBC 9.6 06/29/2022   HGB 13.9 06/29/2022   HCT 41.2 06/29/2022   MCV 85.4 06/29/2022   PLT 242.0 06/29/2022   Lab Results  Component Value Date   CREATININE 0.68 09/22/2022   BUN 12 09/22/2022   NA 137 09/22/2022   K 4.5 09/22/2022   CL 102 09/22/2022   CO2  22 09/22/2022   Lab Results  Component Value Date   CHOL 129 10/17/2019   HDL 47 10/17/2019   LDLCALC 71 10/17/2019   TRIG 50 10/17/2019   CHOLHDL 2.7 10/17/2019    Lab Results  Component Value Date   HGBA1C 6.2 (H) 09/05/2019   Assessment & Plan    1.  Coronary artery disease: -s/p STEMI 2015 with VF and defibrillation x 2 treated with BMS to proximal RCA -Today patient reports    2.  Essential hypertension: -Patient blood pressure today was*** -Continue***   3.  SOB/lower extremity edema: -Patient 2D echo completed showing EF of 60 to 65% with no RWMA and no significant valvular abnormalities -Today patient is*** -Continue***   4.History of CVA/DVT: -She was diagnosed 12/2013 -Patient currently on lifelong AC with Eliquis   5. Hyperlipidemia: -Patient's LDL cholesterol was*** -Continue***      Disposition: Follow-up with Verne Carrow, MD or APP in *** months {Are you ordering a CV Procedure (e.g. stress test, cath, DCCV, TEE, etc)?   Press F2        :[id number]}   Signed, Napoleon Form, Leodis Rains, NP 12/15/2022, 7:53 AM [facility]

## 2022-12-16 ENCOUNTER — Ambulatory Visit: Payer: Medicare Other | Attending: Nurse Practitioner | Admitting: Nurse Practitioner

## 2022-12-16 ENCOUNTER — Encounter: Payer: Self-pay | Admitting: Nurse Practitioner

## 2022-12-16 VITALS — BP 120/60 | HR 67 | Ht 63.0 in | Wt 245.2 lb

## 2022-12-16 DIAGNOSIS — I2510 Atherosclerotic heart disease of native coronary artery without angina pectoris: Secondary | ICD-10-CM

## 2022-12-16 DIAGNOSIS — R600 Localized edema: Secondary | ICD-10-CM

## 2022-12-16 DIAGNOSIS — E7800 Pure hypercholesterolemia, unspecified: Secondary | ICD-10-CM

## 2022-12-16 DIAGNOSIS — Z8673 Personal history of transient ischemic attack (TIA), and cerebral infarction without residual deficits: Secondary | ICD-10-CM

## 2022-12-16 DIAGNOSIS — I10 Essential (primary) hypertension: Secondary | ICD-10-CM | POA: Diagnosis not present

## 2022-12-16 MED ORDER — FUROSEMIDE 40 MG PO TABS: 40.0000 mg | ORAL_TABLET | Freq: Every day | ORAL | 2 refills | Status: DC

## 2022-12-16 NOTE — Patient Instructions (Addendum)
Instrucciones de medicacin:  AUMENTE Lasix a 80 mg durante 3 das y [employer] vuelva a 60 mg una vez al da (una pastilla de 20 mg y [place]inf[place] Rast de 40 mg) *Si necesita un reabastecimiento de sus medicamentos cardacos antes de su prxima cita, llame a su farmacia*  Trabajo de laboratorio: BMET EN 1 [employer] Si le han realizado anlisis de sangre (anlisis de [place]) hoy y sus pruebas son completamente normales, recibir sus resultados nicamente mediante:  Mensaje de MyChart (si tiene MyChart) [place]  Una copia impresa por correo Si tiene alguna prueba de laboratorio que es anormal o necesitamos cambiar su tratamiento, lo llamaremos para [employer] [place].  Pruebas/Procedimientos: Raynelle Fanning  Hacer un seguimiento: En  [facility], usted y sus necesidades de salud son [place] prioridad.  Como parte de nuestra misin continua de brindarle una atencin cardaca excepcional, hemos creado equipos de atencin de proveedores designados.  Estos equipos de atencin incluyen a su cardilogo principal (mdico) y proveedores de Cabin crew (APP: asistentes mdicos y enfermeras practicantes), quienes trabajan juntos para brindarle la atencin que necesita, cuando la necesita.  Recomendamos registrarse en el portal para pacientes llamado "MyChart".  La informacin de registro se proporciona en este Resumen posterior a la visita.  MyChart se utiliza para conectarse con pacientes para visitas virtuales (telemedicina).  [employer] de las pruebas/laboratorio, notas de encuentros, prximas citas, Catering manager. Tambin se pueden enviar mensajes no urgentes a su proveedor.   Para obtener ms informacin sobre lo que [employer] con MyChart, visite [email or web address].  Tu prxima cita:   6 meses  Proveedor:   Shirley Carrow, MD   Otras instrucciones  Por favor revise su peso diariamente.  Comunquese con la oficina si aumenta ms de 2 libras en un da o 5 libras  en una semana.   Limite su consumo de sal a 1500-2000 mg por da o 500 mg de sodio por comida. Controle su presin arterial diariamente durante 2 semanas y luego comunquese con el consultorio con sus lecturas.  Asegrese de revisar 2 horas despus de sus medicamentos.   EVITE estas cosas durante 30 minutos antes de controlar su presin arterial: No beber cafena. No beber alcohol. No comer. No Fumar. Sin ejercicio.  Cinco minutos antes de controlar su presin arterial: Orinar. Sintate en una silla de comedor. Evite sentarse en un sof o silln mullido. Tranquilizarse. No hables.   Medication Instructions:  INCREASE Lasix to 80mg  for 3 days then go back to 60mg  once a day (one 20mg  tab and one 40mg  tab) *If you need a refill on your cardiac medications before your next appointment, please call your pharmacy*   Lab Work: BMET IN 1 WEEK If you have labs (blood work) drawn today and your tests are completely normal, you will receive your results only by: MyChart Message (if you have MyChart) OR A paper copy in the mail If you have any lab test that is abnormal or we need to change your treatment, we will call you to review the results.   Testing/Procedures: NONE ORDERED   Follow-Up: At [facility], you and your health needs are our priority.  As part of our continuing mission to provide you with exceptional heart care, we have created designated Provider Care Teams.  These Care Teams include your primary Cardiologist (physician) and Advanced Practice Providers (APPs -  Physician Assistants and Nurse Practitioners) who all work together to provide you with the care you need, when you need  it.  We recommend signing up for the patient portal called "MyChart".  Sign up information is provided on this After Visit Summary.  MyChart is used to connect with patients for Virtual Visits (Telemedicine).  Patients are able to view lab/test results, encounter notes, upcoming  appointments, etc.  Non-urgent messages can be sent to your provider as well.   To learn more about what you can do with MyChart, go to [email or web address].    Your next appointment:   6 month(s)  Provider:   Verne Carrow, MD     Other Instructions Please check your weight daily. Please contact the office if you gain more than 2lbs in a day or 5lbs in a week.  Limit your salt intake to 1500-2000mg  per day or 500mg  of Sodium per meal. Check your blood pressure daily for 2 weeks, then contact the office with your readings.  Make sure to check 2 hours after your medications.   AVOID these things for 30 minutes before checking your blood pressure: No Drinking caffeine. No Drinking alcohol. No Eating. No Smoking. No Exercising.  Five minutes before checking your blood pressure: Pee. Sit in a dining chair. Avoid sitting in a soft couch or armchair. Be quiet. Do not talk.

## 2022-12-19 NOTE — Assessment & Plan Note (Addendum)
-  She has had a recurrent DVT while on anticoagulation -She previously was on Xarelto, changed to Coumadin subsequently.  Due to recurrent severe bleeding, Coumadin was switched to low-dose Eliquis 2.5 mg twice daily. -Continue anticoagulation indefinitely if no persistent bleeding

## 2022-12-20 ENCOUNTER — Inpatient Hospital Stay: Payer: Medicare Other | Attending: Hematology

## 2022-12-20 ENCOUNTER — Telehealth: Payer: Self-pay

## 2022-12-20 ENCOUNTER — Inpatient Hospital Stay (HOSPITAL_BASED_OUTPATIENT_CLINIC_OR_DEPARTMENT_OTHER): Payer: Medicare Other | Admitting: Hematology

## 2022-12-20 ENCOUNTER — Encounter: Payer: Self-pay | Admitting: Hematology

## 2022-12-20 VITALS — BP 128/67 | HR 73 | Temp 98.2°F | Resp 18 | Ht 63.0 in | Wt 248.5 lb

## 2022-12-20 DIAGNOSIS — Z86718 Personal history of other venous thrombosis and embolism: Secondary | ICD-10-CM | POA: Insufficient documentation

## 2022-12-20 DIAGNOSIS — I824Y9 Acute embolism and thrombosis of unspecified deep veins of unspecified proximal lower extremity: Secondary | ICD-10-CM

## 2022-12-20 DIAGNOSIS — Z8542 Personal history of malignant neoplasm of other parts of uterus: Secondary | ICD-10-CM | POA: Insufficient documentation

## 2022-12-20 DIAGNOSIS — I252 Old myocardial infarction: Secondary | ICD-10-CM | POA: Insufficient documentation

## 2022-12-20 DIAGNOSIS — Z8673 Personal history of transient ischemic attack (TIA), and cerebral infarction without residual deficits: Secondary | ICD-10-CM | POA: Insufficient documentation

## 2022-12-20 DIAGNOSIS — I10 Essential (primary) hypertension: Secondary | ICD-10-CM | POA: Diagnosis not present

## 2022-12-20 DIAGNOSIS — I2510 Atherosclerotic heart disease of native coronary artery without angina pectoris: Secondary | ICD-10-CM | POA: Insufficient documentation

## 2022-12-20 DIAGNOSIS — Z79899 Other long term (current) drug therapy: Secondary | ICD-10-CM | POA: Insufficient documentation

## 2022-12-20 LAB — CMP (CANCER CENTER ONLY)
ALT: 18 U/L (ref 0–44)
AST: 17 U/L (ref 15–41)
Albumin: 3.5 g/dL (ref 3.5–5.0)
Alkaline Phosphatase: 115 U/L (ref 38–126)
Anion gap: 5 (ref 5–15)
BUN: 9 mg/dL (ref 8–23)
CO2: 27 mmol/L (ref 22–32)
Calcium: 8.9 mg/dL (ref 8.9–10.3)
Chloride: 104 mmol/L (ref 98–111)
Creatinine: 0.6 mg/dL (ref 0.44–1.00)
GFR, Estimated: >60 mL/min (ref >60)
Glucose, Bld: 155 mg/dL — ABNORMAL HIGH (ref 70–99)
Potassium: 4 mmol/L (ref 3.5–5.1)
Sodium: 136 mmol/L (ref 135–145)
Total Bilirubin: 0.7 mg/dL (ref 0.3–1.2)
Total Protein: 6.3 g/dL — ABNORMAL LOW (ref 6.5–8.1)

## 2022-12-20 LAB — CBC WITH DIFFERENTIAL (CANCER CENTER ONLY)
Abs Immature Granulocytes: 0.01 10*3/uL (ref 0.00–0.07)
Basophils Absolute: 0.1 10*3/uL (ref 0.0–0.1)
Basophils Relative: 1 %
Eosinophils Absolute: 0.2 10*3/uL (ref 0.0–0.5)
Eosinophils Relative: 2 %
HCT: 39.6 % (ref 36.0–46.0)
Hemoglobin: 13.1 g/dL (ref 12.0–15.0)
Immature Granulocytes: 0 %
Lymphocytes Relative: 34 %
Lymphs Abs: 2.3 10*3/uL (ref 0.7–4.0)
MCH: 28.9 pg (ref 26.0–34.0)
MCHC: 33.1 g/dL (ref 30.0–36.0)
MCV: 87.2 fL (ref 80.0–100.0)
Monocytes Absolute: 0.5 10*3/uL (ref 0.1–1.0)
Monocytes Relative: 7 %
Neutro Abs: 3.8 10*3/uL (ref 1.7–7.7)
Neutrophils Relative %: 56 %
Platelet Count: 217 10*3/uL (ref 150–400)
RBC: 4.54 MIL/uL (ref 3.87–5.11)
RDW: 13.2 % (ref 11.5–15.5)
WBC Count: 6.8 10*3/uL (ref 4.0–10.5)
nRBC: 0 % (ref 0.0–0.2)

## 2022-12-20 NOTE — Telephone Encounter (Signed)
-----   Message from Napoleon Form, Leodis Rains sent at 12/16/2022  6:00 PM EDT ----- Please contact patient and advise her to discontinue lisinopril 40 mg.  She is currently on Micardis which is a angiotensin receptor blocker (ARB) and should not be on an ACE inhibitor together.  Please let me know if you have any questions.

## 2022-12-20 NOTE — Telephone Encounter (Signed)
Patients daughter has been notified and requested I contact the pharmacy and have them discontinue the Lisinopril.   She also requested I send these instructions via mychart.   Daughter voiced understanding.   Left message on the pharmacy voicemail advising them to discontinue Lisinopril. Requested a callback with any questions or concerns.

## 2022-12-20 NOTE — Progress Notes (Signed)
[facility]   Telephone:(336) [phone] Fax:(336) [phone]   Clinic Follow up Note   Patient Care Team: Georgianne Fick, MD as PCP - General (Internal Medicine) Kathleene Hazel, MD as PCP - Cardiology (Cardiology) Quintella Reichert, MD as PCP - Sleep Medicine (Cardiology) Malachy Mood, MD as Consulting Physician (Hematology) Carver Fila, MD as Consulting Physician (Gynecologic Oncology) [facility], Lesia Sago, MD as Consulting Physician (Pulmonary Disease)  Date of Service:  12/20/2022  CHIEF COMPLAINT: f/u of recurrent DVT and h/o GI bleed on anticoagulation   CURRENT THERAPY:  Eliquis 2.5 mg twice daily, has been taking 2.5 daily for past 6 months   ASSESSMENT:  Shirley [name] is a 82 y.o. female with   DVT (deep venous thrombosis) (HCC) -She has had a recurrent DVT while on anticoagulation -She previously was on Xarelto, changed to Coumadin subsequently.  Due to recurrent severe bleeding, Coumadin was switched to low-dose Eliquis 2.5 mg twice daily. -Continue anticoagulation indefinitely if no persistent bleeding -She is tolerating low-dose Eliquis to very well, no episodes of GI bleeding lately -Lab reviewed, CBC normal.  No other concerns.  2. Endometrial endometrioid adenocarcinoma, FIGO grade 1, pT1aN0M0 -She was having vaginal bleeding. She underwent D&C on 03/28/19 to have Mirena IUD placed for progesterone therapy. Path showed Endometrioid carcinoma, FIGO Grade 1-2. She repeated biopsy on 07/02/19 which showed the same.  -She underwent total hysterectomy with BSO on 09/11/19 with Gyn Dr. Eugene Garnet.  -Adjuvant chemotherapy not recommended. -She will continue follow-up with Dr. Pricilla Holm for cancer surveillance    3. Comorbidities: HTN CAD, history of recent MI and stroke, HTN  -Continue medications and following up with other physicians.  PLAN: -lab reviewed -Continue low dose Eliquis indefinitely - f/u as needed    SUMMARY OF  ONCOLOGIC HISTORY: Oncology History Overview Note  IHC MMR intact MSI stable    Endometrial cancer (HCC)  02/26/2019 Initial Biopsy   Endometrial biopsy revealed grade 1 endometrioid adenocarcinoma   02/26/2019 Initial Diagnosis   Endometrial cancer (HCC)   03/27/2019 Imaging   CT abdomen and pelvis: Diffuse endometrial thickening, consistent with known endometrial carcinoma.  No evidence of metastatic disease within the abdomen or pelvis.  Several small uterine fibroids largest measuring 4 cm.   03/28/2019 Surgery   D&C: Pathology shows endometrioid carcinoma, FIGO grade 1-2   03/28/2019 Treatment Plan Change   Mirena IUD inserted   07/02/2019 Pathology Results   EMB: focal Gr1 endometrioid adenocarcinoma Stromal changes c/w hormone effect   09/11/2019 Surgery   TRH/BSO, SLN  On EUA, 8-10cm uterus, mobile. On intra-abdominal entry, normal upper abdominal survey. Normal appearing small and large bowel, omentum. Normal appearing bilateral adnexa. Uterus and bulbous, especially at the fundus, with several 1-2cm intra-mural fibroids. Mapping successful to bilateral obturator nodes. IUD noted within the uterus after specimen removal. NO intra-abdominal or pelvic evidence of disease.   09/11/2019 Pathology Results   IA, grade 1 endometrioid, no MI, negative SLNs, no LVSI   09/11/2019 Cancer Staging   Staging form: Corpus Uteri - Carcinoma and Carcinosarcoma, AJCC 8th Edition - Clinical stage from 09/11/2019: FIGO Stage IA (cT1a, cN0, cM0) - Signed by Carver Fila, MD on 09/17/2019      INTERVAL HISTORY:  [name] is here for a follow up of recurrent DVT and h/o GI bleed on anticoagulation  She was last seen by PA-C Cassie on 12/19/2022. She presents to the clinic accompanied by daughter. Pt is  on low dose of Eliquis 2.5       All other systems were reviewed with the patient and are negative.  MEDICAL HISTORY:  Past Medical History:  Diagnosis Date   BMI  40.0-44.9, adult (HCC)    CAD in native artery, stent to RCA BMS with MI 2015 11/04/2003   a. inferior STEMI 01/2014 s/p BMS to prox RCA.   DVT (deep venous thrombosis) (HCC) 2003   lower extremity DVT with questionable recurrence in 2005. Another admission 2015.   Endometrial cancer (HCC) 2021   Endometrial thickening on ultra sound 07/2009   path showing Fragments of benign endocervical and squamous mucosa. No dysplasia or malignancy // Followed by Dr. Miguel Aschoff   First degree AV block    Gastric ulcer    GERD (gastroesophageal reflux disease)    GI bleed    Hemorrhoid    Hyperlipidemia LDL goal <70    Hypertension    Insomnia 11/02/2010   Memory difficulties 11/02/2010   Mild dilation of ascending aorta (HCC)    Morbid obesity (HCC) 09/16/2012   Obstructive sleep apnea 09/23/2010   Pre-diabetes    Pulmonary nodule    ST elevation myocardial infarction (STEMI) of inferior wall (HCC) 01/24/2014   Stroke (HCC) 2015   Syncope 12/18/2013   Syncope, near 12/14/2018   TIA (transient ischemic attack) 09/23/2010   09/13/2010: CT, MRI/MRA no acute process. Carotid doppler: no stenosis. 2D-Echo Normal LV function with an EF 55-60%.    Trifascicular block    UTI (urinary tract infection) 01/27/2014    SURGICAL HISTORY: Past Surgical History:  Procedure Laterality Date   COLONOSCOPY W/ POLYPECTOMY  11/01/2007   8 mm rectal adenoma, hemorrhoids   CORONARY ANGIOPLASTY WITH STENT PLACEMENT  01/24/14   Inf. STEMI, BMS to RCA   DILATION AND CURETTAGE OF UTERUS N/A 03/28/2019   Procedure: DILATATION AND CURETTAGE OF UTERUS;  Surgeon: Carver Fila, MD;  Location: [facility];  Service: Gynecology;  Laterality: N/A;   ESOPHAGOGASTRODUODENOSCOPY N/A 10/06/2018   Procedure: ESOPHAGOGASTRODUODENOSCOPY (EGD);  Surgeon: Sherrilyn Rist, MD;  Location: [facility] ENDOSCOPY;  Service: Gastroenterology;  Laterality: N/A;   HOT HEMOSTASIS N/A 10/06/2018   Procedure: HOT HEMOSTASIS (ARGON PLASMA  COAGULATION/BICAP);  Surgeon: Sherrilyn Rist, MD;  Location: [facility] ENDOSCOPY;  Service: Gastroenterology;  Laterality: N/A;   INTRAUTERINE DEVICE (IUD) INSERTION N/A 03/28/2019   Procedure: INTRAUTERINE DEVICE (IUD) INSERTION;  Surgeon: Carver Fila, MD;  Location: [facility];  Service: Gynecology;  Laterality: N/A;   KNEE SURGERY  1996   Left knee arthroscopy.   LEFT HEART CATHETERIZATION WITH CORONARY ANGIOGRAM N/A 01/24/2014   Procedure: LEFT HEART CATHETERIZATION WITH CORONARY ANGIOGRAM;  Surgeon: Kathleene Hazel, MD;  Location: [facility] CATH LAB;  Service: Cardiovascular;  Laterality: N/A;   ROBOTIC ASSISTED TOTAL HYSTERECTOMY WITH BILATERAL SALPINGO OOPHERECTOMY N/A 09/11/2019   Procedure: XI ROBOTIC ASSISTED TOTAL HYSTERECTOMY WITH BILATERAL SALPINGO OOPHORECTOMY, LYMPH NODE DISSECTION;  Surgeon: Carver Fila, MD;  Location: [facility];  Service: Gynecology;  Laterality: N/A;   ROTATOR CUFF REPAIR     right.   SCLEROTHERAPY  10/06/2018   Procedure: SCLEROTHERAPY;  Surgeon: Sherrilyn Rist, MD;  Location: [facility] ENDOSCOPY;  Service: Gastroenterology;;   SENTINEL NODE BIOPSY N/A 09/11/2019   Procedure: SENTINEL NODE BIOPSY;  Surgeon: Carver Fila, MD;  Location: [facility];  Service: Gynecology;  Laterality: N/A;   TUBAL LIGATION      I have reviewed the social history and family  history with the patient and they are unchanged from previous note.  ALLERGIES:  is allergic to iohexol and hydrochlorothiazide.  MEDICATIONS:  Current Outpatient Medications  Medication Sig Dispense Refill   acetaminophen (TYLENOL) 500 MG tablet Take 500 mg by mouth 2 (two) times daily as needed (for pain).     albuterol (PROVENTIL) (2.5 MG/3ML) 0.083% nebulizer solution SMARTSIG:3 Milliliter(s) Via Inhaler 4 Times Daily PRN 75 mL 4   albuterol (VENTOLIN HFA) 108 (90 Base) MCG/ACT inhaler INHALE 2 PUFFS INTO THE LUNGS EVERY 4 HOURS AS NEEDED FOR WHEEZING OR SHORTNESS OF BREATH. 18 each 6   amLODipine  (NORVASC) 10 MG tablet Take 10 mg by mouth daily.     arformoterol (BROVANA) 15 MCG/2ML NEBU TAKE 2 MLS (15 MCG TOTAL) BY NEBULIZATION 2 (TWO) TIMES DAILY. 120 mL 3   atorvastatin (LIPITOR) 80 MG tablet TAKE 1 TABLET BY MOUTH EVERY DAY 90 tablet 1   budesonide (PULMICORT) 0.5 MG/2ML nebulizer solution TAKE 2 ML (0.5 MG TOTAL) BY NEBULIZATION TWICE A DAY 120 mL 6   busPIRone (BUSPAR) 7.5 MG tablet Take 7.5 mg by mouth 2 (two) times daily.     cetirizine (ZYRTEC) 10 MG tablet Take 10 mg by mouth daily.     cholecalciferol (VITAMIN D3) 25 MCG (1000 UNIT) tablet Take 1,000 Units by mouth daily.     ELIQUIS 2.5 MG TABS tablet TAKE 1 TABLET BY MOUTH TWICE A DAY 60 tablet 5   Ferrous Gluconate 239 (27 Fe) MG TABS Take by mouth.     ferrous sulfate 325 (65 FE) MG tablet Take 325 mg by mouth daily with breakfast.     furosemide (LASIX) 20 MG tablet TAKE 1 TABLET BY MOUTH EVERY DAY 90 tablet 3   furosemide (LASIX) 40 MG tablet Take 1 tablet (40 mg total) by mouth daily. 30 tablet 2   ibuprofen (ADVIL) 200 MG tablet Take 200 mg by mouth every 6 (six) hours as needed for headache or moderate pain.     lisinopril (ZESTRIL) 40 MG tablet Take 40 mg by mouth daily.     metFORMIN (GLUCOPHAGE-XR) 500 MG 24 hr tablet Take 500 mg by mouth daily with breakfast.     metoprolol succinate (TOPROL-XL) 25 MG 24 hr tablet TAKE 1 TABLET (25 MG TOTAL) BY MOUTH IN THE MORNING AND AT BEDTIME. 180 tablet 1   Multiple Vitamin (MULTIVITAMIN WITH MINERALS) TABS Take 1 tablet by mouth every morning.     MULTIPLE VITAMIN PO 1 tablet Orally Once a day     Naphazoline HCl (CLEAR EYES OP) Place 1 drop into both eyes as needed (for irritation or dryness).      nitroGLYCERIN (NITROSTAT) 0.4 MG SL tablet Place 1 tablet (0.4 mg total) under the tongue every 5 (five) minutes x 3 doses as needed for chest pain. 25 tablet 3   omeprazole (PRILOSEC) 20 MG capsule Take 1 capsule (20 mg total) by mouth every other day. (Patient taking  differently: Take 20 mg by mouth daily.) 45 capsule 3   potassium chloride (KLOR-CON) 10 MEQ tablet Take 1 tablet (10 mEq total) by mouth daily. 90 tablet 1   Pseudoeph-Bromphen-DM (BROMFED DM PO) Take 5 mLs by mouth 3 (three) times daily.     sodium chloride (OCEAN) 0.65 % SOLN nasal spray Place 1 spray into both nostrils as needed for congestion.     Spacer/Aero-Holding Chambers (AEROCHAMBER MV) inhaler Use as instructed 1 each 0   telmisartan (MICARDIS) 80 MG tablet Take 80  mg by mouth daily.     No current facility-administered medications for this visit.    PHYSICAL EXAMINATION: ECOG PERFORMANCE STATUS: 1 - Symptomatic but completely ambulatory  Vitals:   12/20/22 1114  BP: 128/67  Pulse: 73  Resp: 18  Temp: 98.2 F (36.8 C)  SpO2: 100%   Wt Readings from Last 3 Encounters:  12/20/22 248 lb 8 oz (112.7 kg)  12/16/22 245 lb 3.2 oz (111.2 kg)  09/22/22 252 lb 3.2 oz (114.4 kg)     GENERAL:alert, no distress and comfortable SKIN: skin color normal, no rashes or significant lesions EYES: normal, Conjunctiva are pink and non-injected, sclera clear  NEURO: alert & oriented x 3 with fluent speech LABORATORY DATA:  I have reviewed the data as listed    Latest Ref Rng & Units 12/20/2022   10:35 AM 06/29/2022   11:55 AM 12/18/2021   10:17 AM  CBC  WBC 4.0 - 10.5 K/uL 6.8  9.6  13.0   Hemoglobin 12.0 - 15.0 g/dL 60.4  54.0  98.1   Hematocrit 36.0 - 46.0 % 39.6  41.2  41.9   Platelets 150 - 400 K/uL 217  242.0  212         Latest Ref Rng & Units 12/20/2022   10:35 AM 09/22/2022    4:22 PM 12/18/2021   10:17 AM  CMP  Glucose 70 - 99 mg/dL 191  478  295   BUN 8 - 23 mg/dL 9  12  15    Creatinine 0.44 - 1.00 mg/dL 6.21  3.08  6.57   Sodium 135 - 145 mmol/L 136  137  137   Potassium 3.5 - 5.1 mmol/L 4.0  4.5  3.9   Chloride 98 - 111 mmol/L 104  102  102   CO2 22 - 32 mmol/L 27  22  29    Calcium 8.9 - 10.3 mg/dL 8.9  9.4  8.9   Total Protein 6.5 - 8.1 g/dL 6.3   6.7   Total  Bilirubin 0.3 - 1.2 mg/dL 0.7   1.1   Alkaline Phos 38 - 126 U/L 115   107   AST 15 - 41 U/L 17   20   ALT 0 - 44 U/L 18   29       RADIOGRAPHIC STUDIES: I have personally reviewed the radiological images as listed and agreed with the findings in the report. No results found.    No orders of the defined types were placed in this encounter.  All questions were answered. The patient knows to call the clinic with any problems, questions or concerns. No barriers to learning was detected. The total time spent in the appointment was 15 minutes.     Malachy Mood, MD 12/20/2022   Carolin Coy, CMA, am acting as scribe for Malachy Mood, MD.   I have reviewed the above documentation for accuracy and completeness, and I agree with the above.

## 2022-12-23 ENCOUNTER — Ambulatory Visit: Payer: Medicare Other | Attending: Nurse Practitioner

## 2022-12-23 DIAGNOSIS — E7800 Pure hypercholesterolemia, unspecified: Secondary | ICD-10-CM | POA: Diagnosis not present

## 2022-12-23 DIAGNOSIS — I2510 Atherosclerotic heart disease of native coronary artery without angina pectoris: Secondary | ICD-10-CM

## 2022-12-23 DIAGNOSIS — I10 Essential (primary) hypertension: Secondary | ICD-10-CM

## 2022-12-23 DIAGNOSIS — Z8673 Personal history of transient ischemic attack (TIA), and cerebral infarction without residual deficits: Secondary | ICD-10-CM | POA: Diagnosis not present

## 2022-12-23 DIAGNOSIS — R600 Localized edema: Secondary | ICD-10-CM

## 2022-12-24 LAB — BASIC METABOLIC PANEL
BUN/Creatinine Ratio: 17 (ref 12–28)
BUN: 10 mg/dL (ref 8–27)
CO2: 23 mmol/L (ref 20–29)
Calcium: 9.4 mg/dL (ref 8.7–10.3)
Chloride: 100 mmol/L (ref 96–106)
Creatinine, Ser: 0.6 mg/dL (ref 0.57–1.00)
Glucose: 100 mg/dL — ABNORMAL HIGH (ref 70–99)
Potassium: 4.3 mmol/L (ref 3.5–5.2)
Sodium: 138 mmol/L (ref 134–144)
eGFR: 90 mL/min/{1.73_m2} (ref >59)

## 2022-12-29 ENCOUNTER — Other Ambulatory Visit: Payer: Self-pay | Admitting: Nurse Practitioner

## 2023-01-27 DIAGNOSIS — Z23 Encounter for immunization: Secondary | ICD-10-CM | POA: Diagnosis not present

## 2023-02-14 ENCOUNTER — Other Ambulatory Visit: Payer: Self-pay | Admitting: Pulmonary Disease

## 2023-02-14 ENCOUNTER — Encounter: Payer: Self-pay | Admitting: Pulmonary Disease

## 2023-02-14 ENCOUNTER — Ambulatory Visit (INDEPENDENT_AMBULATORY_CARE_PROVIDER_SITE_OTHER): Payer: Medicare Other | Admitting: Pulmonary Disease

## 2023-02-14 DIAGNOSIS — R0609 Other forms of dyspnea: Secondary | ICD-10-CM

## 2023-02-14 MED ORDER — ALBUTEROL SULFATE (2.5 MG/3ML) 0.083% IN NEBU
INHALATION_SOLUTION | RESPIRATORY_TRACT | 2 refills | Status: DC
Start: 2023-02-14 — End: 2023-02-14

## 2023-02-14 NOTE — Patient Instructions (Signed)
Continue nebulized albuterol as needed  This was refilled today  Return to clinic in 1 year or sooner as needed with Dr. Judeth Horn

## 2023-02-14 NOTE — Progress Notes (Signed)
@Patient  ID: Shirley [name], female    DOB: 1941/01/30, 82 y.o.   MRN: [id number]  Chief Complaint  Patient presents with   Follow-up    Doing well.  Only used inhaler two times in last month.    Referring provider: Georgianne Fick, MD  HPI:   82 y.o. woman whom are seen in follow up  for evaluation of wheeze, mild dyspnea on exertion felt to be related to asthma given temporary improvement with ICS and prednisone.  Most recent GI note reviewed.  Most recent cardiology note x 2 reviewed.  Most recent hematology/oncology note reviewed.  Returns for routine follow-up.  Last seen 11 months ago.  No longer using ICS/LAMA nebulizer.  Only using albuterol as needed.  Using once every 2 to 3 weeks.  Overall similar symptoms are quite well-controlled.  Up-to-date on flu and COVID boosters.  HPI at initial visit: Patient was in usual health.  Developed ear pain, sore throat, runny nose, cough, chest tightness and wheeze.  This was April 2023.  Got some antibiotics.  Mild improvement.  Seen by PCP.  Given prednisone and additional antibiotics.  Prednisone helped immensely.  Sore throat, ear pain, cough largely improved.  Mild residual cough.  Wheezes main thing that persist.  No time of day when things are better or worse.  No position make things better or worse.  No seasonal environmental factors she can identify to make things better or worse.  Does not use albuterol afraid of how will make her feel.  Prescribe budesonide nebulized 0.25 mg twice daily.  Feels this does help a bit.  Helps with decreasing the frequency of wheeze.  She denies significant allergy symptoms except for the spring.  No recurrent bronchitis.  No prior history of asthma.  No other respiratory illness.  She had chest x-ray 08/2021 that on my review interpretation reveals clear lungs bilaterally.  Most recent cross-sectional imaging CTA chest 10/2019 reviewed interpreted as clear lungs bilaterally.  PMH: OSA on CPAP  hypertension, Surgical history: Tubal ligation, total hysterectomy and oophorectomy, D&C of uterus, rotator cuff repair Family history: Mother with CAD, hyperlipidemia, diabetes, Father with CAD Social history: Never smoker, lives in [place] / Pulmonary Flowsheets:   ACT:      No data to display          MMRC:     No data to display          Epworth:      No data to display          Tests:   FENO:  No results found for: "NITRICOXIDE"  PFT:    Latest Ref Rng & Units 03/08/2022    8:54 AM  PFT Results  FVC-Pre L 1.93   FVC-Predicted Pre % 87   FVC-Post L 2.00   FVC-Predicted Post % 90   Pre FEV1/FVC % % 77   Post FEV1/FCV % % 84   FEV1-Pre L 1.49   FEV1-Predicted Pre % 91   FEV1-Post L 1.67   DLCO uncorrected ml/min/mmHg 13.40   DLCO UNC% % 79   DLCO corrected ml/min/mmHg 13.40   DLCO COR %Predicted % 79   DLVA Predicted % 91   TLC L 3.67   TLC % Predicted % 79   RV % Predicted % 73     WALK:      No data to display        Personally reviewed interpret as normal  spirometry, no significant bronchodilator response.  Lung volumes within normal notes.  DLCO within normal limits.  Imaging: Personally reviewed and as per EMR discussion this note No results found.  Lab Results: Personally reviewed CBC    Component Value Date/Time   WBC 6.8 12/20/2022 1035   WBC 9.6 06/29/2022 1155   RBC 4.54 12/20/2022 1035   HGB 13.1 12/20/2022 1035   HGB 13.3 10/17/2019 0914   HGB 12.4 12/13/2014 0857   HCT 39.6 12/20/2022 1035   HCT 41.2 10/17/2019 0914   HCT 37.4 12/13/2014 0857   PLT 217 12/20/2022 1035   PLT 267 10/17/2019 0914   MCV 87.2 12/20/2022 1035   MCV 86 10/17/2019 0914   MCV 84.2 12/13/2014 0857   MCH 28.9 12/20/2022 1035   MCHC 33.1 12/20/2022 1035   RDW 13.2 12/20/2022 1035   RDW 13.1 10/17/2019 0914   RDW 14.7 (H) 12/13/2014 0857   LYMPHSABS 2.3 12/20/2022 1035   LYMPHSABS 6.3 (H) 12/13/2014 0857   MONOABS  0.5 12/20/2022 1035   MONOABS 0.7 12/13/2014 0857   EOSABS 0.2 12/20/2022 1035   EOSABS 0.4 12/13/2014 0857   BASOSABS 0.1 12/20/2022 1035   BASOSABS 0.1 12/13/2014 0857    BMET    Component Value Date/Time   NA 138 12/23/2022 1312   NA 137 12/13/2014 0857   K 4.3 12/23/2022 1312   K 3.8 12/13/2014 0857   CL 100 12/23/2022 1312   CO2 23 12/23/2022 1312   CO2 28 12/13/2014 0857   GLUCOSE 100 (H) 12/23/2022 1312   GLUCOSE 155 (H) 12/20/2022 1035   GLUCOSE 98 12/13/2014 0857   BUN 10 12/23/2022 1312   BUN 10.6 12/13/2014 0857   CREATININE 0.60 12/23/2022 1312   CREATININE 0.60 12/20/2022 1035   CREATININE 0.7 12/13/2014 0857   CALCIUM 9.4 12/23/2022 1312   CALCIUM 8.8 12/13/2014 0857   GFRNONAA >60 12/20/2022 1035   GFRAA >60 12/06/2019 0923    BNP    Component Value Date/Time   BNP 147.7 (H) 08/03/2021 1826    ProBNP    Component Value Date/Time   PROBNP 191 09/22/2022 1622   PROBNP 64.6 09/15/2012 2345    Specialty Problems       Pulmonary Problems   Obstructive sleep apnea    Allergies  Allergen Reactions   Iohexol Rash and Other (See Comments)    Patient stated a rash appeared after a CT scan    Hydrochlorothiazide Other (See Comments)    12/18/21: Palpitations and SOB per the pt - AS    Immunization History  Administered Date(s) Administered   Influenza, High Dose Seasonal PF 12/26/2014, 12/15/2015   Influenza, Quadrivalent, Recombinant, Inj, Pf 01/06/2017, 01/04/2019, 01/11/2020, 01/16/2021   Influenza,inj,Quad PF,6+ Mos 12/20/2013   Influenza-Unspecified 01/04/2012, 12/04/2016, 12/05/2018   PFIZER Comirnaty(Gray Top)Covid-19 Tri-Sucrose Vaccine 08/15/2020   PFIZER(Purple Top)SARS-COV-2 Vaccination 04/17/2019, 05/08/2019, 12/03/2019, 12/04/2019   PNEUMOCOCCAL CONJUGATE-20 05/06/2021   Pfizer Covid-19 Vaccine Bivalent Booster 77yrs & up 01/07/2021   Pneumococcal Conjugate-13 02/10/2015   Pneumococcal Polysaccharide-23 11/02/2010, 01/04/2012    Tdap 11/02/2010    Past Medical History:  Diagnosis Date   BMI 40.0-44.9, adult (HCC)    CAD in native artery, stent to RCA BMS with MI 2015 11/04/2003   a. inferior STEMI 01/2014 s/p BMS to prox RCA.   DVT (deep venous thrombosis) (HCC) 2003   lower extremity DVT with questionable recurrence in 2005. Another admission 2015.   Endometrial cancer (HCC) 2021   Endometrial thickening on ultra  sound 07/2009   path showing Fragments of benign endocervical and squamous mucosa. No dysplasia or malignancy // Followed by Dr. Miguel Aschoff   First degree AV block    Gastric ulcer    GERD (gastroesophageal reflux disease)    GI bleed    Hemorrhoid    Hyperlipidemia LDL goal <70    Hypertension    Insomnia 11/02/2010   Memory difficulties 11/02/2010   Mild dilation of ascending aorta (HCC)    Morbid obesity (HCC) 09/16/2012   Obstructive sleep apnea 09/23/2010   Pre-diabetes    Pulmonary nodule    ST elevation myocardial infarction (STEMI) of inferior wall (HCC) 01/24/2014   Stroke (HCC) 2015   Syncope 12/18/2013   Syncope, near 12/14/2018   TIA (transient ischemic attack) 09/23/2010   09/13/2010: CT, MRI/MRA no acute process. Carotid doppler: no stenosis. 2D-Echo Normal LV function with an EF 55-60%.    Trifascicular block    UTI (urinary tract infection) 01/27/2014    Tobacco History: Social History   Tobacco Use  Smoking Status Never  Smokeless Tobacco Never   Counseling given: Not Answered   Continue to not smoke  Outpatient Encounter Medications as of 02/14/2023  Medication Sig   acetaminophen (TYLENOL) 500 MG tablet Take 500 mg by mouth 2 (two) times daily as needed (for pain).   albuterol (VENTOLIN HFA) 108 (90 Base) MCG/ACT inhaler INHALE 2 PUFFS INTO THE LUNGS EVERY 4 HOURS AS NEEDED FOR WHEEZING OR SHORTNESS OF BREATH.   amLODipine (NORVASC) 10 MG tablet Take 10 mg by mouth daily.   arformoterol (BROVANA) 15 MCG/2ML NEBU TAKE 2 MLS (15 MCG TOTAL) BY NEBULIZATION 2  (TWO) TIMES DAILY.   atorvastatin (LIPITOR) 80 MG tablet TAKE 1 TABLET BY MOUTH EVERY DAY   budesonide (PULMICORT) 0.5 MG/2ML nebulizer solution TAKE 2 ML (0.5 MG TOTAL) BY NEBULIZATION TWICE A DAY   busPIRone (BUSPAR) 7.5 MG tablet Take 7.5 mg by mouth 2 (two) times daily.   cetirizine (ZYRTEC) 10 MG tablet Take 10 mg by mouth daily.   cholecalciferol (VITAMIN D3) 25 MCG (1000 UNIT) tablet Take 1,000 Units by mouth daily.   ELIQUIS 2.5 MG TABS tablet TAKE 1 TABLET BY MOUTH TWICE A DAY   Ferrous Gluconate 239 (27 Fe) MG TABS Take by mouth.   ferrous sulfate 325 (65 FE) MG tablet Take 325 mg by mouth daily with breakfast.   furosemide (LASIX) 20 MG tablet TAKE 1 TABLET BY MOUTH EVERY DAY   furosemide (LASIX) 40 MG tablet TAKE 1 TABLET BY MOUTH EVERY DAY   ibuprofen (ADVIL) 200 MG tablet Take 200 mg by mouth every 6 (six) hours as needed for headache or moderate pain.   lisinopril (ZESTRIL) 40 MG tablet Take 40 mg by mouth daily.   metFORMIN (GLUCOPHAGE-XR) 500 MG 24 hr tablet Take 500 mg by mouth daily with breakfast.   metoprolol succinate (TOPROL-XL) 25 MG 24 hr tablet TAKE 1 TABLET (25 MG TOTAL) BY MOUTH IN THE MORNING AND AT BEDTIME.   Multiple Vitamin (MULTIVITAMIN WITH MINERALS) TABS Take 1 tablet by mouth every morning.   MULTIPLE VITAMIN PO 1 tablet Orally Once a day   Naphazoline HCl (CLEAR EYES OP) Place 1 drop into both eyes as needed (for irritation or dryness).    nitroGLYCERIN (NITROSTAT) 0.4 MG SL tablet Place 1 tablet (0.4 mg total) under the tongue every 5 (five) minutes x 3 doses as needed for chest pain.   omeprazole (PRILOSEC) 20 MG capsule Take 1 capsule (  20 mg total) by mouth every other day. (Patient taking differently: Take 20 mg by mouth daily.)   potassium chloride (KLOR-CON) 10 MEQ tablet Take 1 tablet (10 mEq total) by mouth daily.   Pseudoeph-Bromphen-DM (BROMFED DM PO) Take 5 mLs by mouth 3 (three) times daily.   sodium chloride (OCEAN) 0.65 % SOLN nasal spray Place  1 spray into both nostrils as needed for congestion.   Spacer/Aero-Holding Chambers (AEROCHAMBER MV) inhaler Use as instructed   telmisartan (MICARDIS) 80 MG tablet Take 80 mg by mouth daily.   [DISCONTINUED] albuterol (PROVENTIL) (2.5 MG/3ML) 0.083% nebulizer solution SMARTSIG:3 Milliliter(s) Via Inhaler 4 Times Daily PRN   albuterol (PROVENTIL) (2.5 MG/3ML) 0.083% nebulizer solution SMARTSIG:3 Milliliter(s) Via Inhaler 4 Times Daily PRN   No facility-administered encounter medications on file as of 02/14/2023.     Review of Systems  Review of Systems  N/a Physical Exam  BP (!) 140/78 (BP Location: Left Arm, Patient Position: Sitting, Cuff Size: Large)   Pulse 72   Temp 98.2 F (36.8 C) (Oral)   Ht 5\' 3"  (1.6 m)   Wt 240 lb 12.8 oz (109.2 kg)   SpO2 99%   BMI 42.66 kg/m   Wt Readings from Last 5 Encounters:  02/14/23 240 lb 12.8 oz (109.2 kg)  12/20/22 248 lb 8 oz (112.7 kg)  12/16/22 245 lb 3.2 oz (111.2 kg)  09/22/22 252 lb 3.2 oz (114.4 kg)  06/29/22 249 lb (112.9 kg)    BMI Readings from Last 5 Encounters:  02/14/23 42.66 kg/m  12/20/22 44.02 kg/m  12/16/22 43.44 kg/m  09/22/22 44.68 kg/m  06/29/22 44.11 kg/m     Physical Exam General: Well-appearing, no acute distress Eyes: EOMI, no icterus Neck: Supple, no JVP Pulmonary: Clear, no wheeze Cardiovascular: Regular rate and rhythm, no murmur MSK: No synovitis, no joint effusion Abdomen: Nondistended, bowel sounds present Neuro: Normal gait, no weakness Psych: Normal mood, full affect   Assessment & Plan:   Dyspnea on exertion: Overall improved with asthma therapies.  Currently on nebulized Saba as needed.  PFTs performed 03/2022 are normal.  Cough, wheeze: Suspect related to asthma, triggered after viral illness.  Budesonide high-dose nebulizer has helped.  Cough largely gone.  Wheeze markedly improved.  Now de-escalated from nebulized ICS/LABA therapy to [place] as needed.  Refill today.  Healthcare  maintenance: Up-to-date on COVID-vaccine and flu shot.  Return in about 1 year (around 02/14/2024).   Karren Burly, MD 02/14/2023

## 2023-02-16 DIAGNOSIS — Z1231 Encounter for screening mammogram for malignant neoplasm of breast: Secondary | ICD-10-CM | POA: Diagnosis not present

## 2023-05-16 ENCOUNTER — Encounter: Payer: Self-pay | Admitting: Pulmonary Disease

## 2023-05-16 NOTE — Telephone Encounter (Signed)
 Appt made. NFN

## 2023-05-17 ENCOUNTER — Encounter: Payer: Self-pay | Admitting: Pulmonary Disease

## 2023-05-17 ENCOUNTER — Ambulatory Visit: Payer: Medicare Other | Admitting: Pulmonary Disease

## 2023-05-17 ENCOUNTER — Ambulatory Visit: Payer: Medicare PPO | Admitting: Pulmonary Disease

## 2023-05-17 DIAGNOSIS — J4540 Moderate persistent asthma, uncomplicated: Secondary | ICD-10-CM | POA: Diagnosis not present

## 2023-05-17 DIAGNOSIS — R0609 Other forms of dyspnea: Secondary | ICD-10-CM | POA: Diagnosis not present

## 2023-05-17 MED ORDER — BUDESONIDE 0.5 MG/2ML IN SUSP: 0.5000 mg | Freq: Two times a day (BID) | RESPIRATORY_TRACT | 6 refills | Status: DC

## 2023-05-17 MED ORDER — ARFORMOTEROL TARTRATE 15 MCG/2ML IN NEBU: 15.0000 ug | INHALATION_SOLUTION | Freq: Two times a day (BID) | RESPIRATORY_TRACT | 3 refills | Status: DC

## 2023-05-17 MED ORDER — ALBUTEROL SULFATE (2.5 MG/3ML) 0.083% IN NEBU: 2.5000 mg | INHALATION_SOLUTION | RESPIRATORY_TRACT | 2 refills | Status: DC | PRN

## 2023-05-17 MED ORDER — PREDNISONE 20 MG PO TABS: ORAL_TABLET | ORAL | 0 refills | Status: AC

## 2023-05-17 NOTE — Progress Notes (Signed)
@Patient  ID: Shirley [name], female    DOB: [birth year], 83 y.o.   MRN: [id number]  Chief Complaint  Patient presents with   Acute Visit    Pt states starting 01/25 SOB, wheezing,CC,dry nothing comes up.  Pcp Rx     Referring provider: Georgianne Fick, MD  HPI:   83 y.o. woman whom are seen in follow up  for evaluation of wheeze, mild dyspnea on exertion felt to be related to asthma given historic temporary improvement with ICS and prednisone.    Unfortunately, developed seems like laryngitis last month.  Her throat, lost her voice.  Has improved.  But for the last 2 weeks she has had cough.  Throughout the day.  No time of day when things are better or worse.  Albuterol helps a little bit.  She also endorses new wheeze.  She has had the symptoms in the past.  We discussed treatment steroids in the past were effective.  Resuming these.  HPI at initial visit: Patient was in usual health.  Developed ear pain, sore throat, runny nose, cough, chest tightness and wheeze.  This was April 2023.  Got some antibiotics.  Mild improvement.  Seen by PCP.  Given prednisone and additional antibiotics.  Prednisone helped immensely.  Sore throat, ear pain, cough largely improved.  Mild residual cough.  Wheezes main thing that persist.  No time of day when things are better or worse.  No position make things better or worse.  No seasonal environmental factors she can identify to make things better or worse.  Does not use albuterol afraid of how will make her feel.  Prescribe budesonide nebulized 0.25 mg twice daily.  Feels this does help a bit.  Helps with decreasing the frequency of wheeze.  She denies significant allergy symptoms except for the spring.  No recurrent bronchitis.  No prior history of asthma.  No other respiratory illness.  She had chest x-ray 08/2021 that on my review interpretation reveals clear lungs bilaterally.  Most recent cross-sectional imaging CTA chest 10/2019 reviewed  interpreted as clear lungs bilaterally.  PMH: OSA on CPAP hypertension, Surgical history: Tubal ligation, total hysterectomy and oophorectomy, D&C of uterus, rotator cuff repair Family history: Mother with CAD, hyperlipidemia, diabetes, Father with CAD Social history: Never smoker, lives in [place] / Pulmonary Flowsheets:   ACT:      No data to display          MMRC:     No data to display          Epworth:      No data to display          Tests:   FENO:  No results found for: "NITRICOXIDE"  PFT:    Latest Ref Rng & Units 03/08/2022    8:54 AM  PFT Results  FVC-Pre L 1.93   FVC-Predicted Pre % 87   FVC-Post L 2.00   FVC-Predicted Post % 90   Pre FEV1/FVC % % 77   Post FEV1/FCV % % 84   FEV1-Pre L 1.49   FEV1-Predicted Pre % 91   FEV1-Post L 1.67   DLCO uncorrected ml/min/mmHg 13.40   DLCO UNC% % 79   DLCO corrected ml/min/mmHg 13.40   DLCO COR %Predicted % 79   DLVA Predicted % 91   TLC L 3.67   TLC % Predicted % 79   RV % Predicted % 73     WALK:  No data to display        Personally reviewed interpret as normal spirometry, no significant bronchodilator response.  Lung volumes within normal notes.  DLCO within normal limits.  Imaging: Personally reviewed and as per EMR discussion this note No results found.  Lab Results: Personally reviewed CBC    Component Value Date/Time   WBC 6.8 12/20/2022 1035   WBC 9.6 06/29/2022 1155   RBC 4.54 12/20/2022 1035   HGB 13.1 12/20/2022 1035   HGB 13.3 10/17/2019 0914   HGB 12.4 12/13/2014 0857   HCT 39.6 12/20/2022 1035   HCT 41.2 10/17/2019 0914   HCT 37.4 12/13/2014 0857   PLT 217 12/20/2022 1035   PLT 267 10/17/2019 0914   MCV 87.2 12/20/2022 1035   MCV 86 10/17/2019 0914   MCV 84.2 12/13/2014 0857   MCH 28.9 12/20/2022 1035   MCHC 33.1 12/20/2022 1035   RDW 13.2 12/20/2022 1035   RDW 13.1 10/17/2019 0914   RDW 14.7 (H) 12/13/2014 0857   LYMPHSABS 2.3  12/20/2022 1035   LYMPHSABS 6.3 (H) 12/13/2014 0857   MONOABS 0.5 12/20/2022 1035   MONOABS 0.7 12/13/2014 0857   EOSABS 0.2 12/20/2022 1035   EOSABS 0.4 12/13/2014 0857   BASOSABS 0.1 12/20/2022 1035   BASOSABS 0.1 12/13/2014 0857    BMET    Component Value Date/Time   NA 138 12/23/2022 1312   NA 137 12/13/2014 0857   K 4.3 12/23/2022 1312   K 3.8 12/13/2014 0857   CL 100 12/23/2022 1312   CO2 23 12/23/2022 1312   CO2 28 12/13/2014 0857   GLUCOSE 100 (H) 12/23/2022 1312   GLUCOSE 155 (H) 12/20/2022 1035   GLUCOSE 98 12/13/2014 0857   BUN 10 12/23/2022 1312   BUN 10.6 12/13/2014 0857   CREATININE 0.60 12/23/2022 1312   CREATININE 0.60 12/20/2022 1035   CREATININE 0.7 12/13/2014 0857   CALCIUM 9.4 12/23/2022 1312   CALCIUM 8.8 12/13/2014 0857   GFRNONAA >60 12/20/2022 1035   GFRAA >60 12/06/2019 0923    BNP    Component Value Date/Time   BNP 147.7 (H) 08/03/2021 1826    ProBNP    Component Value Date/Time   PROBNP 191 09/22/2022 1622   PROBNP 64.6 09/15/2012 2345    Specialty Problems       Pulmonary Problems   Obstructive sleep apnea    Allergies  Allergen Reactions   Iohexol Rash and Other (See Comments)    Patient stated a rash appeared after a CT scan    Hydrochlorothiazide Other (See Comments)    12/18/21: Palpitations and SOB per the pt - AS    Immunization History  Administered Date(s) Administered   Influenza, High Dose Seasonal PF 12/26/2014, 12/15/2015   Influenza, Quadrivalent, Recombinant, Inj, Pf 01/06/2017, 01/04/2019, 01/11/2020, 01/16/2021   Influenza,inj,Quad PF,6+ Mos 12/20/2013   Influenza-Unspecified 01/04/2012, 12/04/2016, 12/05/2018   PFIZER Comirnaty(Gray Top)Covid-19 Tri-Sucrose Vaccine 08/15/2020   PFIZER(Purple Top)SARS-COV-2 Vaccination 04/17/2019, 05/08/2019, 12/03/2019, 12/04/2019   PNEUMOCOCCAL CONJUGATE-20 05/06/2021   Pfizer Covid-19 Vaccine Bivalent Booster 69yrs & up 01/07/2021   Pneumococcal Conjugate-13  02/10/2015   Pneumococcal Polysaccharide-23 11/02/2010, 01/04/2012   Tdap 11/02/2010    Past Medical History:  Diagnosis Date   BMI 40.0-44.9, adult (HCC)    CAD in native artery, stent to RCA BMS with MI 2015 11/04/2003   a. inferior STEMI 01/2014 s/p BMS to prox RCA.   DVT (deep venous thrombosis) (HCC) 2003   lower extremity DVT with questionable recurrence in  2005. Another admission 2015.   Endometrial cancer (HCC) 2021   Endometrial thickening on ultra sound 07/2009   path showing Fragments of benign endocervical and squamous mucosa. No dysplasia or malignancy // Followed by Dr. Miguel Aschoff   First degree AV block    Gastric ulcer    GERD (gastroesophageal reflux disease)    GI bleed    Hemorrhoid    Hyperlipidemia LDL goal <70    Hypertension    Insomnia 11/02/2010   Memory difficulties 11/02/2010   Mild dilation of ascending aorta (HCC)    Morbid obesity (HCC) 09/16/2012   Obstructive sleep apnea 09/23/2010   Pre-diabetes    Pulmonary nodule    ST elevation myocardial infarction (STEMI) of inferior wall (HCC) 01/24/2014   Stroke (HCC) 2015   Syncope 12/18/2013   Syncope, near 12/14/2018   TIA (transient ischemic attack) 09/23/2010   09/13/2010: CT, MRI/MRA no acute process. Carotid doppler: no stenosis. 2D-Echo Normal LV function with an EF 55-60%.    Trifascicular block    UTI (urinary tract infection) 01/27/2014    Tobacco History: Social History   Tobacco Use  Smoking Status Never  Smokeless Tobacco Never   Counseling given: Not Answered   Continue to not smoke  Outpatient Encounter Medications as of 05/17/2023  Medication Sig   acetaminophen (TYLENOL) 500 MG tablet Take 500 mg by mouth 2 (two) times daily as needed (for pain).   albuterol (VENTOLIN HFA) 108 (90 Base) MCG/ACT inhaler INHALE 2 PUFFS INTO THE LUNGS EVERY 4 HOURS AS NEEDED FOR WHEEZING OR SHORTNESS OF BREATH.   amLODipine (NORVASC) 10 MG tablet Take 10 mg by mouth daily.   atorvastatin  (LIPITOR) 80 MG tablet TAKE 1 TABLET BY MOUTH EVERY DAY   busPIRone (BUSPAR) 7.5 MG tablet Take 7.5 mg by mouth 2 (two) times daily.   cetirizine (ZYRTEC) 10 MG tablet Take 10 mg by mouth daily.   cholecalciferol (VITAMIN D3) 25 MCG (1000 UNIT) tablet Take 1,000 Units by mouth daily.   ELIQUIS 2.5 MG TABS tablet TAKE 1 TABLET BY MOUTH TWICE A DAY   Ferrous Gluconate 239 (27 Fe) MG TABS Take by mouth.   ferrous sulfate 325 (65 FE) MG tablet Take 325 mg by mouth daily with breakfast.   furosemide (LASIX) 20 MG tablet TAKE 1 TABLET BY MOUTH EVERY DAY   furosemide (LASIX) 40 MG tablet TAKE 1 TABLET BY MOUTH EVERY DAY   ibuprofen (ADVIL) 200 MG tablet Take 200 mg by mouth every 6 (six) hours as needed for headache or moderate pain.   lisinopril (ZESTRIL) 40 MG tablet Take 40 mg by mouth daily.   metFORMIN (GLUCOPHAGE-XR) 500 MG 24 hr tablet Take 500 mg by mouth daily with breakfast.   metoprolol succinate (TOPROL-XL) 25 MG 24 hr tablet TAKE 1 TABLET (25 MG TOTAL) BY MOUTH IN THE MORNING AND AT BEDTIME.   Multiple Vitamin (MULTIVITAMIN WITH MINERALS) TABS Take 1 tablet by mouth every morning.   MULTIPLE VITAMIN PO 1 tablet Orally Once a day   Naphazoline HCl (CLEAR EYES OP) Place 1 drop into both eyes as needed (for irritation or dryness).    nitroGLYCERIN (NITROSTAT) 0.4 MG SL tablet Place 1 tablet (0.4 mg total) under the tongue every 5 (five) minutes x 3 doses as needed for chest pain.   omeprazole (PRILOSEC) 20 MG capsule Take 1 capsule (20 mg total) by mouth every other day. (Patient taking differently: Take 20 mg by mouth daily.)   potassium chloride (KLOR-CON)  10 MEQ tablet Take 1 tablet (10 mEq total) by mouth daily.   predniSONE (DELTASONE) 20 MG tablet Take 2 tablets (40 mg total) by mouth daily with breakfast for 5 days, THEN 1 tablet (20 mg total) daily with breakfast for 5 days.   Pseudoeph-Bromphen-DM (BROMFED DM PO) Take 5 mLs by mouth 3 (three) times daily.   sodium chloride (OCEAN)  0.65 % SOLN nasal spray Place 1 spray into both nostrils as needed for congestion.   Spacer/Aero-Holding Chambers (AEROCHAMBER MV) inhaler Use as instructed   telmisartan (MICARDIS) 80 MG tablet Take 80 mg by mouth daily.   [DISCONTINUED] albuterol (PROVENTIL) (2.5 MG/3ML) 0.083% nebulizer solution Take 3 mLs (2.5 mg total) by nebulization every 4 (four) hours as needed for wheezing or shortness of breath.   [DISCONTINUED] arformoterol (BROVANA) 15 MCG/2ML NEBU TAKE 2 MLS (15 MCG TOTAL) BY NEBULIZATION 2 (TWO) TIMES DAILY.   [DISCONTINUED] budesonide (PULMICORT) 0.5 MG/2ML nebulizer solution TAKE 2 ML (0.5 MG TOTAL) BY NEBULIZATION TWICE A DAY   albuterol (PROVENTIL) (2.5 MG/3ML) 0.083% nebulizer solution Take 3 mLs (2.5 mg total) by nebulization every 4 (four) hours as needed for wheezing or shortness of breath.   arformoterol (BROVANA) 15 MCG/2ML NEBU Take 2 mLs (15 mcg total) by nebulization 2 (two) times daily.   budesonide (PULMICORT) 0.5 MG/2ML nebulizer solution Take 2 mLs (0.5 mg total) by nebulization 2 (two) times daily.   No facility-administered encounter medications on file as of 05/17/2023.     Review of Systems  Review of Systems  N/a Physical Exam  BP (!) 140/82 (BP Location: Left Arm, Patient Position: Sitting, Cuff Size: Large)   Pulse 84   Ht 5\' 3"  (1.6 m)   Wt 251 lb 9.6 oz (114.1 kg)   SpO2 96%   BMI 44.57 kg/m   Wt Readings from Last 5 Encounters:  05/17/23 251 lb 9.6 oz (114.1 kg)  02/14/23 240 lb 12.8 oz (109.2 kg)  12/20/22 248 lb 8 oz (112.7 kg)  12/16/22 245 lb 3.2 oz (111.2 kg)  09/22/22 252 lb 3.2 oz (114.4 kg)    BMI Readings from Last 5 Encounters:  05/17/23 44.57 kg/m  02/14/23 42.66 kg/m  12/20/22 44.02 kg/m  12/16/22 43.44 kg/m  09/22/22 44.68 kg/m     Physical Exam General: Well-appearing, no acute distress Eyes: EOMI, no icterus Neck: Supple, no JVP Pulmonary: Clear, diffuse end expiratory wheeze Cardiovascular: Warm MSK: No  synovitis, no joint effusion Abdomen: Nondistended, bowel sounds present Neuro: Normal gait, no weakness Psych: Normal mood, full affect   Assessment & Plan:   Dyspnea on exertion: Overall improved with asthma therapies.  Escalate as below given cough and wheeze.  PFTs performed 03/2022 are normal.  Cough, wheeze: Suspect related to asthma, triggered after viral illness.  Currently only on Saba inhaler given well-controlled symptoms for some time.  Now worse with what sounds like recent viral illness.  Prednisone taper today.  Resume arformoterol and budesonide nebulizer twice a day, new prescription today.  Continue albuterol as needed via nebulizer.  New prescription, refilled today.   Return in about 6 weeks (around 06/28/2023) for f/u Dr. Judeth Horn.   Karren Burly, MD 05/17/2023

## 2023-05-17 NOTE — Patient Instructions (Signed)
I am sorry the cough has returned  It sounds like the virus infection from a few weeks ago has led to the ongoing cough  Take prednisone 40 mg for 5 days and 20 mg for 5 days and stop  Resume arformoterol and budesonide nebulized twice a day, morning and evening.  New prescription today.  Continue albuterol nebulized as needed, this was refilled today.  Return to clinic in 4 to 6 weeks with Dr. Judeth Horn

## 2023-05-18 ENCOUNTER — Telehealth: Payer: Self-pay | Admitting: Pulmonary Disease

## 2023-05-18 ENCOUNTER — Other Ambulatory Visit (HOSPITAL_COMMUNITY): Payer: Self-pay

## 2023-05-18 NOTE — Telephone Encounter (Signed)
Lm for patient.

## 2023-05-18 NOTE — Telephone Encounter (Signed)
[name] [facility]) calling about the following RX Dr. Rexene Edison sent:  Budesonide nebulized twice a day, morning and evening. New prescription today.  It was too expensive and Dr. Rexene Edison said for her to call that poss we could get an alternative med for her that would be covered by insurance.   Her # is [phone]  They did get the Albuterol neb solution. It was 60.00. Can that one be substituted as well.

## 2023-05-18 NOTE — Telephone Encounter (Signed)
Per test claims through [facility]:   Budesonide nebulizer solutions- $50.00 Albuterol - $18.30  Patient may also be covered under their Medicare Part B which may have different prices

## 2023-05-18 NOTE — Telephone Encounter (Signed)
There is no substitute for either the albuterol or the budesonide, has the patient been updated on the cost provided by [place]? Are the updated prices too expensive?

## 2023-05-18 NOTE — Telephone Encounter (Signed)
PA team, please advise. Can you run a test claim for cheaper alternatives.

## 2023-05-18 NOTE — Telephone Encounter (Signed)
Dr. Judeth Horn, please see below message and advise. Thanks

## 2023-05-19 ENCOUNTER — Other Ambulatory Visit (HOSPITAL_COMMUNITY): Payer: Self-pay

## 2023-05-19 MED ORDER — BUDESONIDE 0.5 MG/2ML IN SUSP
0.5000 mg | Freq: Two times a day (BID) | RESPIRATORY_TRACT | 6 refills | Status: DC
  Filled 2023-05-19: qty 120, 30d supply, fill #0
  Filled 2023-10-04: qty 120, 30d supply, fill #1

## 2023-05-19 NOTE — Telephone Encounter (Signed)
Called [name] and there was no answer- LMTCB

## 2023-05-19 NOTE — Telephone Encounter (Signed)
PT's daughter ret our call. I advised her of Dr's recommendation. She would like the Budesonide called in to the [facility] on [facility]. Thank you!

## 2023-05-19 NOTE — Telephone Encounter (Signed)
Budesonide sent to preferred [facility]  Nothing further needed

## 2023-06-16 NOTE — Progress Notes (Unsigned)
 No chief complaint on file.  History of Present Illness: 83 yo female  with history of CAD with inferior STEMI October 2015, HTN, DVT, prior CVA, prior GI bleeding, chronic diastolic CHF and sleep apnea here today for cardiac follow up. She was admitted to [facility] October 2015 with an inferior STEMI. A bare metal stent was placed in the sub-totally occluded proximal RCA. The LAD and Circumflex had no disease. She has been on long term anti-coagulation with coumadin therapy for recurrent DVT. This has been changed to Eliquis. She was seen in our office June 2019 with chest pain. Nuclear stress test 09/27/17 showed low risk with normal LV function and no ischemia. There was a possible apical lateral scar. Admitted with GI bleeding July 2020 and was seen by GI and had an upper endoscopy revealing a gastric ulcer. Echo July 2024 with LVEF=60-65%. Mild LVH. No valve disease.      She is here today for follow up. The patient denies any chest pain, dyspnea, palpitations, lower extremity edema, orthopnea, PND, dizziness, near syncope or syncope.    Primary Care Physician: Georgianne Fick, MD  Past Medical History:  Diagnosis Date   BMI 40.0-44.9, adult (HCC)    CAD in native artery, stent to RCA BMS with MI 2015 11/04/2003   a. inferior STEMI 01/2014 s/p BMS to prox RCA.   DVT (deep venous thrombosis) (HCC) 2003   lower extremity DVT with questionable recurrence in 2005. Another admission 2015.   Endometrial cancer (HCC) 2021   Endometrial thickening on ultra sound 07/2009   path showing Fragments of benign endocervical and squamous mucosa. No dysplasia or malignancy // Followed by Dr. Miguel Aschoff   First degree AV block    Gastric ulcer    GERD (gastroesophageal reflux disease)    GI bleed    Hemorrhoid    Hyperlipidemia LDL goal <70    Hypertension    Insomnia 11/02/2010   Memory difficulties 11/02/2010   Mild dilation of ascending aorta (HCC)    Morbid obesity (HCC) 09/16/2012    Obstructive sleep apnea 09/23/2010   Pre-diabetes    Pulmonary nodule    ST elevation myocardial infarction (STEMI) of inferior wall (HCC) 01/24/2014   Stroke (HCC) 2015   Syncope 12/18/2013   Syncope, near 12/14/2018   TIA (transient ischemic attack) 09/23/2010   09/13/2010: CT, MRI/MRA no acute process. Carotid doppler: no stenosis. 2D-Echo Normal LV function with an EF 55-60%.    Trifascicular block    UTI (urinary tract infection) 01/27/2014    Past Surgical History:  Procedure Laterality Date   COLONOSCOPY W/ POLYPECTOMY  11/01/2007   8 mm rectal adenoma, hemorrhoids   CORONARY ANGIOPLASTY WITH STENT PLACEMENT  01/24/14   Inf. STEMI, BMS to RCA   DILATION AND CURETTAGE OF UTERUS N/A 03/28/2019   Procedure: DILATATION AND CURETTAGE OF UTERUS;  Surgeon: Carver Fila, MD;  Location: [facility];  Service: Gynecology;  Laterality: N/A;   ESOPHAGOGASTRODUODENOSCOPY N/A 10/06/2018   Procedure: ESOPHAGOGASTRODUODENOSCOPY (EGD);  Surgeon: Sherrilyn Rist, MD;  Location: [facility] ENDOSCOPY;  Service: Gastroenterology;  Laterality: N/A;   HOT HEMOSTASIS N/A 10/06/2018   Procedure: HOT HEMOSTASIS (ARGON PLASMA COAGULATION/BICAP);  Surgeon: Sherrilyn Rist, MD;  Location: [facility] ENDOSCOPY;  Service: Gastroenterology;  Laterality: N/A;   INTRAUTERINE DEVICE (IUD) INSERTION N/A 03/28/2019   Procedure: INTRAUTERINE DEVICE (IUD) INSERTION;  Surgeon: Carver Fila, MD;  Location: [facility];  Service: Gynecology;  Laterality: N/A;   KNEE SURGERY  1996   Left knee arthroscopy.   LEFT HEART CATHETERIZATION WITH CORONARY ANGIOGRAM N/A 01/24/2014   Procedure: LEFT HEART CATHETERIZATION WITH CORONARY ANGIOGRAM;  Surgeon: Kathleene Hazel, MD;  Location: [facility] CATH LAB;  Service: Cardiovascular;  Laterality: N/A;   ROBOTIC ASSISTED TOTAL HYSTERECTOMY WITH BILATERAL SALPINGO OOPHERECTOMY N/A 09/11/2019   Procedure: XI ROBOTIC ASSISTED TOTAL HYSTERECTOMY WITH BILATERAL SALPINGO OOPHORECTOMY, LYMPH NODE  DISSECTION;  Surgeon: Carver Fila, MD;  Location: [facility];  Service: Gynecology;  Laterality: N/A;   ROTATOR CUFF REPAIR     right.   SCLEROTHERAPY  10/06/2018   Procedure: SCLEROTHERAPY;  Surgeon: Sherrilyn Rist, MD;  Location: [facility] ENDOSCOPY;  Service: Gastroenterology;;   SENTINEL NODE BIOPSY N/A 09/11/2019   Procedure: SENTINEL NODE BIOPSY;  Surgeon: Carver Fila, MD;  Location: [facility];  Service: Gynecology;  Laterality: N/A;   TUBAL LIGATION      Current Outpatient Medications  Medication Sig Dispense Refill   acetaminophen (TYLENOL) 500 MG tablet Take 500 mg by mouth 2 (two) times daily as needed (for pain).     albuterol (PROVENTIL) (2.5 MG/3ML) 0.083% nebulizer solution Take 3 mLs (2.5 mg total) by nebulization every 4 (four) hours as needed for wheezing or shortness of breath. 375 mL 2   albuterol (VENTOLIN HFA) 108 (90 Base) MCG/ACT inhaler INHALE 2 PUFFS INTO THE LUNGS EVERY 4 HOURS AS NEEDED FOR WHEEZING OR SHORTNESS OF BREATH. 18 each 6   amLODipine (NORVASC) 10 MG tablet Take 10 mg by mouth daily.     arformoterol (BROVANA) 15 MCG/2ML NEBU Take 2 mLs (15 mcg total) by nebulization 2 (two) times daily. 120 mL 3   atorvastatin (LIPITOR) 80 MG tablet TAKE 1 TABLET BY MOUTH EVERY DAY 90 tablet 1   budesonide (PULMICORT) 0.5 MG/2ML nebulizer solution Take 2 mLs (0.5 mg total) by nebulization 2 (two) times daily. 120 mL 6   busPIRone (BUSPAR) 7.5 MG tablet Take 7.5 mg by mouth 2 (two) times daily.     cetirizine (ZYRTEC) 10 MG tablet Take 10 mg by mouth daily.     cholecalciferol (VITAMIN D3) 25 MCG (1000 UNIT) tablet Take 1,000 Units by mouth daily.     ELIQUIS 2.5 MG TABS tablet TAKE 1 TABLET BY MOUTH TWICE A DAY 60 tablet 5   Ferrous Gluconate 239 (27 Fe) MG TABS Take by mouth.     ferrous sulfate 325 (65 FE) MG tablet Take 325 mg by mouth daily with breakfast.     furosemide (LASIX) 20 MG tablet TAKE 1 TABLET BY MOUTH EVERY DAY 90 tablet 3   furosemide (LASIX) 40 MG  tablet TAKE 1 TABLET BY MOUTH EVERY DAY 90 tablet 3   ibuprofen (ADVIL) 200 MG tablet Take 200 mg by mouth every 6 (six) hours as needed for headache or moderate pain.     lisinopril (ZESTRIL) 40 MG tablet Take 40 mg by mouth daily.     metFORMIN (GLUCOPHAGE-XR) 500 MG 24 hr tablet Take 500 mg by mouth daily with breakfast.     metoprolol succinate (TOPROL-XL) 25 MG 24 hr tablet TAKE 1 TABLET (25 MG TOTAL) BY MOUTH IN THE MORNING AND AT BEDTIME. 180 tablet 1   Multiple Vitamin (MULTIVITAMIN WITH MINERALS) TABS Take 1 tablet by mouth every morning.     MULTIPLE VITAMIN PO 1 tablet Orally Once a day     Naphazoline HCl (CLEAR EYES OP) Place 1 drop into both eyes as needed (for irritation or dryness).  nitroGLYCERIN (NITROSTAT) 0.4 MG SL tablet Place 1 tablet (0.4 mg total) under the tongue every 5 (five) minutes x 3 doses as needed for chest pain. 25 tablet 3   omeprazole (PRILOSEC) 20 MG capsule Take 1 capsule (20 mg total) by mouth every other day. (Patient taking differently: Take 20 mg by mouth daily.) 45 capsule 3   potassium chloride (KLOR-CON) 10 MEQ tablet Take 1 tablet (10 mEq total) by mouth daily. 90 tablet 1   Pseudoeph-Bromphen-DM (BROMFED DM PO) Take 5 mLs by mouth 3 (three) times daily.     sodium chloride (OCEAN) 0.65 % SOLN nasal spray Place 1 spray into both nostrils as needed for congestion.     Spacer/Aero-Holding Chambers (AEROCHAMBER MV) inhaler Use as instructed 1 each 0   telmisartan (MICARDIS) 80 MG tablet Take 80 mg by mouth daily.     No current facility-administered medications for this visit.    Allergies  Allergen Reactions   Iohexol Rash and Other (See Comments)    Patient stated a rash appeared after a CT scan    Hydrochlorothiazide Other (See Comments)    12/18/21: Palpitations and SOB per the pt - AS    Social History   Socioeconomic History   Marital status: Married    Spouse name: Not on file   Number of children: 6   Years of education: College    Highest education level: Not on file  Occupational History   Occupation: Geologist, engineering   Occupation: TEACHER- Advice worker ELEMENTARY    Employer: [employer]  Tobacco Use   Smoking status: Never   Smokeless tobacco: Never  Vaping Use   Vaping status: Never Used  Substance and Sexual Activity   Alcohol use: No   Drug use: No   Sexual activity: Not on file  Other Topics Concern   Not on file  Social History Narrative   Full Code status.   Insurance: BCBS   Social Drivers of Corporate investment banker Strain: Not on file  Food Insecurity: Not on file  Transportation Needs: Not on file  Physical Activity: Not on file  Stress: Not on file  Social Connections: Not on file  Intimate Partner Violence: Not on file    Family History  Problem Relation Age of Onset   Diabetes Mother    Hyperlipidemia Mother    Heart disease Mother    Dementia Mother    Angina Father    Heart attack Father    Thyroid cancer Sister    Liver cancer Sister    Heart disease Brother        x 2 in 40s yo   Prostate cancer Brother 22   Alzheimer's disease Maternal Grandmother    Cirrhosis Daughter    Stroke Neg Hx    Colon cancer Neg Hx    Esophageal cancer Neg Hx    Rectal cancer Neg Hx    Stomach cancer Neg Hx    Ovarian cancer Neg Hx    Endometrial cancer Neg Hx     Review of Systems:  As stated in the HPI and otherwise negative.   There were no vitals taken for this visit.  Physical Examination:  General: Well developed, well nourished, NAD  HEENT: OP clear, mucus membranes moist  SKIN: warm, dry. No rashes. Neuro: No focal deficits  Musculoskeletal: Muscle strength 5/5 all ext  Psychiatric: Mood and affect normal  Neck: No JVD, no carotid bruits, no thyromegaly, no lymphadenopathy.  Lungs:Clear bilaterally, no  wheezes, rhonci, crackles Cardiovascular: Regular rate and rhythm. No murmurs, gallops or rubs. Abdomen:Soft. Bowel sounds present. Non-tender.   Extremities: No lower extremity edema. Pulses are 2 + in the bilateral DP/PT.   EKG:  EKG is not *** ordered today. The ekg ordered today demonstrates   Recent Labs: 09/22/2022: NT-Pro BNP 191 12/20/2022: ALT 18; Hemoglobin 13.1; Platelet Count 217 12/23/2022: BUN 10; Creatinine, Ser 0.60; Potassium 4.3; Sodium 138    Wt Readings from Last 3 Encounters:  05/17/23 114.1 kg  02/14/23 109.2 kg  12/20/22 112.7 kg    Assessment and Plan:   1. CAD without angina: No chest pain. Continue statin and beta blocker. She is off of ASA since she is on Eliquis.   2. HTN: BP is well controlled. Continue Norvasc, *** Lisinopril and Toprol.     3. DVT: She is on lifelong anti-coagulation. This is managed in primary care. Continue Eliquis.   4. HLD: Lipids followed in primary care. LDL ***. Continue statin  5. Chronic diastolic CHF: Wt is stable. LE edema ***. Continue Lasix.   Current medicines are reviewed at length with the patient today.  The patient does not have concerns regarding medicines.  The following changes have been made:  no change  Labs/ tests ordered today include:   No orders of the defined types were placed in this encounter.   Disposition:   FU with me in 12 months  Signed, Verne Carrow, MD 06/16/2023 10:10 PM    [facility] [address] [place], [place], [place]  [address] Phone: [phone]; Fax: [phone]

## 2023-06-17 ENCOUNTER — Other Ambulatory Visit: Payer: Self-pay | Admitting: Nurse Practitioner

## 2023-06-17 ENCOUNTER — Ambulatory Visit: Payer: Medicare PPO | Attending: Cardiovascular Disease | Admitting: Cardiovascular Disease

## 2023-06-17 ENCOUNTER — Encounter: Payer: Self-pay | Admitting: Cardiovascular Disease

## 2023-06-17 VITALS — BP 164/88 | HR 73 | Ht 63.0 in | Wt 246.4 lb

## 2023-06-17 DIAGNOSIS — I10 Essential (primary) hypertension: Secondary | ICD-10-CM | POA: Diagnosis not present

## 2023-06-17 DIAGNOSIS — I2510 Atherosclerotic heart disease of native coronary artery without angina pectoris: Secondary | ICD-10-CM

## 2023-06-17 DIAGNOSIS — E7800 Pure hypercholesterolemia, unspecified: Secondary | ICD-10-CM

## 2023-06-17 DIAGNOSIS — I5032 Chronic diastolic (congestive) heart failure: Secondary | ICD-10-CM | POA: Diagnosis not present

## 2023-06-17 MED ORDER — METOPROLOL SUCCINATE ER 50 MG PO TB24: 50.0000 mg | ORAL_TABLET | Freq: Every day | ORAL | 3 refills | Status: AC

## 2023-06-17 MED ORDER — POTASSIUM CHLORIDE CRYS ER 20 MEQ PO TBCR: 20.0000 meq | EXTENDED_RELEASE_TABLET | Freq: Every day | ORAL | 3 refills | Status: AC

## 2023-06-17 MED ORDER — METOPROLOL SUCCINATE ER 50 MG PO TB24: 50.0000 mg | ORAL_TABLET | Freq: Two times a day (BID) | ORAL | 3 refills | Status: DC

## 2023-06-17 MED ORDER — FUROSEMIDE 40 MG PO TABS: 40.0000 mg | ORAL_TABLET | Freq: Every day | ORAL | 3 refills | Status: DC

## 2023-06-17 NOTE — Patient Instructions (Signed)
 Medication Instructions:  Your physician has recommended you make the following change in your medication:  1.) increase Toprol XL (metoprolol succinate) to 50 mg - one tablet twice a day 2.) increase lasix 40 mg - take one tablet twice a day 3.) potassium chloride - 20 meq - take one tablet daily  *If you need a refill on your cardiac medications before your next appointment, please call your pharmacy*   Lab Work: none If you have labs (blood work) drawn today and your tests are completely normal, you will receive your results only by: MyChart Message (if you have MyChart) OR A paper copy in the mail If you have any lab test that is abnormal or we need to change your treatment, we will call you to review the results.   Testing/Procedures: none   Follow-Up: At [facility], you and your health needs are our priority.  As part of our continuing mission to provide you with exceptional heart care, we have created designated Provider Care Teams.  These Care Teams include your primary Cardiologist (physician) and Advanced Practice Providers (APPs -  Physician Assistants and Nurse Practitioners) who all work together to provide you with the care you need, when you need it.   Your next appointment:   6 month(s)  Provider:   Verne Carrow, MD     Other Instructions You have been referred to clinical pharmacy team for blood pressure

## 2023-06-17 NOTE — Addendum Note (Signed)
 Addended by: Lendon Ka on: 06/17/2023 02:02 PM   Modules accepted: Orders

## 2023-06-22 ENCOUNTER — Encounter: Payer: Self-pay | Admitting: Pulmonary Disease

## 2023-06-22 ENCOUNTER — Ambulatory Visit

## 2023-06-22 ENCOUNTER — Ambulatory Visit: Payer: Medicare PPO | Admitting: Pulmonary Disease

## 2023-06-22 VITALS — BP 149/84 | HR 71 | Temp 98.1°F | Ht 63.0 in | Wt 246.2 lb

## 2023-06-22 DIAGNOSIS — R0609 Other forms of dyspnea: Secondary | ICD-10-CM

## 2023-06-22 DIAGNOSIS — R0789 Other chest pain: Secondary | ICD-10-CM

## 2023-06-22 DIAGNOSIS — R062 Wheezing: Secondary | ICD-10-CM | POA: Diagnosis not present

## 2023-06-22 DIAGNOSIS — J4540 Moderate persistent asthma, uncomplicated: Secondary | ICD-10-CM

## 2023-06-22 NOTE — Patient Instructions (Signed)
 Nice to see you today  I am glad the nebulizer medicines help  Chest x-ray today just to make sure that discomfort in her back and chest is okay, I think you will be able to make sure  Return to clinic in 3 months or sooner as needed with Dr. Judeth Horn

## 2023-06-22 NOTE — Progress Notes (Signed)
 @Patient  ID: Shirley [name], female    DOB: [birth year], 83 y.o.   MRN: [id number]  Chief Complaint  Patient presents with   Follow-up    Referring provider: Georgianne Fick, MD  HPI:   83 y.o. woman whom are seen in follow up  for evaluation of wheeze, mild dyspnea on exertion felt to be related to asthma given historic temporary improvement with ICS and prednisone.    Returns for short-term follow-up.  Symptoms now improving with results of ICS/LABA via nebulizer.  Some wheezing but better.  Cough is minimal.  Dyspnea exertion somewhat improved.  All reassuring.  She has chronic back pain.  She would like is referred to her chest sometimes.  She points to her mid back.  Tender to palpation.  We discussed chest x-ray for reassurance.  HPI at initial visit: Patient was in usual health.  Developed ear pain, sore throat, runny nose, cough, chest tightness and wheeze.  This was April 2023.  Got some antibiotics.  Mild improvement.  Seen by PCP.  Given prednisone and additional antibiotics.  Prednisone helped immensely.  Sore throat, ear pain, cough largely improved.  Mild residual cough.  Wheezes main thing that persist.  No time of day when things are better or worse.  No position make things better or worse.  No seasonal environmental factors she can identify to make things better or worse.  Does not use albuterol afraid of how will make her feel.  Prescribe budesonide nebulized 0.25 mg twice daily.  Feels this does help a bit.  Helps with decreasing the frequency of wheeze.  She denies significant allergy symptoms except for the spring.  No recurrent bronchitis.  No prior history of asthma.  No other respiratory illness.  She had chest x-ray 08/2021 that on my review interpretation reveals clear lungs bilaterally.  Most recent cross-sectional imaging CTA chest 10/2019 reviewed interpreted as clear lungs bilaterally.  PMH: OSA on CPAP hypertension, Surgical history: Tubal ligation,  total hysterectomy and oophorectomy, D&C of uterus, rotator cuff repair Family history: Mother with CAD, hyperlipidemia, diabetes, Father with CAD Social history: Never smoker, lives in [place] / Pulmonary Flowsheets:   ACT:  Asthma Control Test ACT Total Score  06/22/2023 11:09 AM 17    MMRC:     No data to display          Epworth:      No data to display          Tests:   FENO:  No results found for: "NITRICOXIDE"  PFT:    Latest Ref Rng & Units 03/08/2022    8:54 AM  PFT Results  FVC-Pre L 1.93   FVC-Predicted Pre % 87   FVC-Post L 2.00   FVC-Predicted Post % 90   Pre FEV1/FVC % % 77   Post FEV1/FCV % % 84   FEV1-Pre L 1.49   FEV1-Predicted Pre % 91   FEV1-Post L 1.67   DLCO uncorrected ml/min/mmHg 13.40   DLCO UNC% % 79   DLCO corrected ml/min/mmHg 13.40   DLCO COR %Predicted % 79   DLVA Predicted % 91   TLC L 3.67   TLC % Predicted % 79   RV % Predicted % 73   Percent.  Normal spirometry, no significant vasodilators found, 1 lines of mild restriction, DLCO within normal limits  WALK:      No data to display        Personally reviewed  interpret as normal spirometry, no significant bronchodilator response.  Lung volumes within normal notes.  DLCO within normal limits.  Imaging: Personally reviewed and as per EMR discussion this note No results found.  Lab Results: Personally reviewed CBC    Component Value Date/Time   WBC 6.8 12/20/2022 1035   WBC 9.6 06/29/2022 1155   RBC 4.54 12/20/2022 1035   HGB 13.1 12/20/2022 1035   HGB 13.3 10/17/2019 0914   HGB 12.4 12/13/2014 0857   HCT 39.6 12/20/2022 1035   HCT 41.2 10/17/2019 0914   HCT 37.4 12/13/2014 0857   PLT 217 12/20/2022 1035   PLT 267 10/17/2019 0914   MCV 87.2 12/20/2022 1035   MCV 86 10/17/2019 0914   MCV 84.2 12/13/2014 0857   MCH 28.9 12/20/2022 1035   MCHC 33.1 12/20/2022 1035   RDW 13.2 12/20/2022 1035   RDW 13.1 10/17/2019 0914   RDW 14.7 (H)  12/13/2014 0857   LYMPHSABS 2.3 12/20/2022 1035   LYMPHSABS 6.3 (H) 12/13/2014 0857   MONOABS 0.5 12/20/2022 1035   MONOABS 0.7 12/13/2014 0857   EOSABS 0.2 12/20/2022 1035   EOSABS 0.4 12/13/2014 0857   BASOSABS 0.1 12/20/2022 1035   BASOSABS 0.1 12/13/2014 0857    BMET    Component Value Date/Time   NA 138 12/23/2022 1312   NA 137 12/13/2014 0857   K 4.3 12/23/2022 1312   K 3.8 12/13/2014 0857   CL 100 12/23/2022 1312   CO2 23 12/23/2022 1312   CO2 28 12/13/2014 0857   GLUCOSE 100 (H) 12/23/2022 1312   GLUCOSE 155 (H) 12/20/2022 1035   GLUCOSE 98 12/13/2014 0857   BUN 10 12/23/2022 1312   BUN 10.6 12/13/2014 0857   CREATININE 0.60 12/23/2022 1312   CREATININE 0.60 12/20/2022 1035   CREATININE 0.7 12/13/2014 0857   CALCIUM 9.4 12/23/2022 1312   CALCIUM 8.8 12/13/2014 0857   GFRNONAA >60 12/20/2022 1035   GFRAA >60 12/06/2019 0923    BNP    Component Value Date/Time   BNP 147.7 (H) 08/03/2021 1826    ProBNP    Component Value Date/Time   PROBNP 191 09/22/2022 1622   PROBNP 64.6 09/15/2012 2345    Specialty Problems       Pulmonary Problems   Obstructive sleep apnea    Allergies  Allergen Reactions   Iohexol Rash and Other (See Comments)    Patient stated a rash appeared after a CT scan    Hydrochlorothiazide Other (See Comments)    12/18/21: Palpitations and SOB per the pt - AS    Immunization History  Administered Date(s) Administered   Influenza, High Dose Seasonal PF 12/26/2014, 12/15/2015   Influenza, Quadrivalent, Recombinant, Inj, Pf 01/06/2017, 01/04/2019, 01/11/2020, 01/16/2021   Influenza,inj,Quad PF,6+ Mos 12/20/2013   Influenza-Unspecified 01/04/2012, 12/04/2016, 12/05/2018   PFIZER Comirnaty(Gray Top)Covid-19 Tri-Sucrose Vaccine 08/15/2020   PFIZER(Purple Top)SARS-COV-2 Vaccination 04/17/2019, 05/08/2019, 12/03/2019, 12/04/2019   PNEUMOCOCCAL CONJUGATE-20 05/06/2021   Pfizer Covid-19 Vaccine Bivalent Booster 55yrs & up 01/07/2021    Pneumococcal Conjugate-13 02/10/2015   Pneumococcal Polysaccharide-23 11/02/2010, 01/04/2012   Tdap 11/02/2010    Past Medical History:  Diagnosis Date   BMI 40.0-44.9, adult (HCC)    CAD in native artery, stent to RCA BMS with MI 2015 11/04/2003   a. inferior STEMI 01/2014 s/p BMS to prox RCA.   DVT (deep venous thrombosis) (HCC) 2003   lower extremity DVT with questionable recurrence in 2005. Another admission 2015.   Endometrial cancer (HCC) 2021   Endometrial  thickening on ultra sound 07/2009   path showing Fragments of benign endocervical and squamous mucosa. No dysplasia or malignancy // Followed by Dr. Miguel Aschoff   First degree AV block    Gastric ulcer    GERD (gastroesophageal reflux disease)    GI bleed    Hemorrhoid    Hyperlipidemia LDL goal <70    Hypertension    Insomnia 11/02/2010   Memory difficulties 11/02/2010   Mild dilation of ascending aorta (HCC)    Morbid obesity (HCC) 09/16/2012   Obstructive sleep apnea 09/23/2010   Pre-diabetes    Pulmonary nodule    ST elevation myocardial infarction (STEMI) of inferior wall (HCC) 01/24/2014   Stroke (HCC) 2015   Syncope 12/18/2013   Syncope, near 12/14/2018   TIA (transient ischemic attack) 09/23/2010   09/13/2010: CT, MRI/MRA no acute process. Carotid doppler: no stenosis. 2D-Echo Normal LV function with an EF 55-60%.    Trifascicular block    UTI (urinary tract infection) 01/27/2014    Tobacco History: Social History   Tobacco Use  Smoking Status Never  Smokeless Tobacco Never   Counseling given: Not Answered   Continue to not smoke  Outpatient Encounter Medications as of 06/22/2023  Medication Sig   acetaminophen (TYLENOL) 500 MG tablet Take 500 mg by mouth 2 (two) times daily as needed (for pain).   albuterol (PROVENTIL) (2.5 MG/3ML) 0.083% nebulizer solution Take 3 mLs (2.5 mg total) by nebulization every 4 (four) hours as needed for wheezing or shortness of breath.   albuterol (VENTOLIN HFA) 108  (90 Base) MCG/ACT inhaler INHALE 2 PUFFS INTO THE LUNGS EVERY 4 HOURS AS NEEDED FOR WHEEZING OR SHORTNESS OF BREATH.   amLODipine (NORVASC) 10 MG tablet Take 10 mg by mouth daily.   arformoterol (BROVANA) 15 MCG/2ML NEBU Take 2 mLs (15 mcg total) by nebulization 2 (two) times daily.   atorvastatin (LIPITOR) 80 MG tablet TAKE 1 TABLET BY MOUTH EVERY DAY   budesonide (PULMICORT) 0.5 MG/2ML nebulizer solution Take 2 mLs (0.5 mg total) by nebulization 2 (two) times daily.   busPIRone (BUSPAR) 7.5 MG tablet Take 7.5 mg by mouth 2 (two) times daily.   cefUROXime (CEFTIN) 500 MG tablet Take 500 mg by mouth 2 (two) times daily.   cetirizine (ZYRTEC) 10 MG tablet Take 10 mg by mouth daily.   cholecalciferol (VITAMIN D3) 25 MCG (1000 UNIT) tablet Take 1,000 Units by mouth daily.   ELIQUIS 2.5 MG TABS tablet TAKE 1 TABLET BY MOUTH TWICE A DAY   Ferrous Gluconate 239 (27 Fe) MG TABS Take by mouth.   ferrous sulfate 325 (65 FE) MG tablet Take 325 mg by mouth daily with breakfast.   furosemide (LASIX) 40 MG tablet Take 1 tablet (40 mg total) by mouth daily.   ibuprofen (ADVIL) 200 MG tablet Take 200 mg by mouth every 6 (six) hours as needed for headache or moderate pain.   metFORMIN (GLUCOPHAGE-XR) 500 MG 24 hr tablet Take 500 mg by mouth daily with breakfast.   metoprolol succinate (TOPROL XL) 50 MG 24 hr tablet Take 1 tablet (50 mg total) by mouth daily. Take with or immediately following a meal.   Multiple Vitamin (MULTIVITAMIN WITH MINERALS) TABS Take 1 tablet by mouth every morning.   MULTIPLE VITAMIN PO 1 tablet Orally Once a day   Naphazoline HCl (CLEAR EYES OP) Place 1 drop into both eyes as needed (for irritation or dryness).    nitroGLYCERIN (NITROSTAT) 0.4 MG SL tablet Place 1 tablet (0.4  mg total) under the tongue every 5 (five) minutes x 3 doses as needed for chest pain.   omeprazole (PRILOSEC) 20 MG capsule Take 1 capsule (20 mg total) by mouth every other day. (Patient taking differently: Take  20 mg by mouth daily.)   potassium chloride (KLOR-CON) 10 MEQ tablet Take 1 tablet (10 mEq total) by mouth daily.   potassium chloride SA (KLOR-CON M) 20 MEQ tablet Take 1 tablet (20 mEq total) by mouth daily.   Pseudoeph-Bromphen-DM (BROMFED DM PO) Take 5 mLs by mouth 3 (three) times daily.   sodium chloride (OCEAN) 0.65 % SOLN nasal spray Place 1 spray into both nostrils as needed for congestion.   Spacer/Aero-Holding Chambers (AEROCHAMBER MV) inhaler Use as instructed   telmisartan (MICARDIS) 80 MG tablet Take 80 mg by mouth daily.   No facility-administered encounter medications on file as of 06/22/2023.     Review of Systems  Review of Systems  N/a Physical Exam  BP (!) 149/84 (BP Location: Left Arm, Patient Position: Sitting, Cuff Size: Large)   Pulse 71   Temp 98.1 F (36.7 C) (Temporal)   Ht 5\' 3"  (1.6 m)   Wt 246 lb 3.2 oz (111.7 kg)   SpO2 96%   BMI 43.61 kg/m   Wt Readings from Last 5 Encounters:  06/22/23 246 lb 3.2 oz (111.7 kg)  06/17/23 246 lb 6.4 oz (111.8 kg)  05/17/23 251 lb 9.6 oz (114.1 kg)  02/14/23 240 lb 12.8 oz (109.2 kg)  12/20/22 248 lb 8 oz (112.7 kg)    BMI Readings from Last 5 Encounters:  06/22/23 43.61 kg/m  06/17/23 43.65 kg/m  05/17/23 44.57 kg/m  02/14/23 42.66 kg/m  12/20/22 44.02 kg/m     Physical Exam General: Well-appearing, no acute distress Eyes: EOMI, no icterus Neck: Supple, no JVP Pulmonary: Clear, wheezing has improved Cardiovascular: Warm MSK: No synovitis, no joint effusion, back tender to palpation mid thoracic area abutting the spine Abdomen: Nondistended, bowel sounds present Neuro: Normal gait, no weakness Psych: Normal mood, full affect   Assessment & Plan:   Dyspnea on exertion: Overall improved with asthma therapies.  Escalate as below given cough and wheeze.  PFTs performed 03/2022 are normal.  Cough, wheeze: Suspect related to asthma, triggered after viral illness 05/2023.  Symptoms have improved in  interim with resumption of ICS/LABA via nebulizer.  Advised to continue.  Back\Chest discomfort: To palpation mid back mid thoracic area.  Per my recollection this is a bit of chronic issue.  Repeat chest x-ray today, suspect will provide reassurance.   Return in about 3 months (around 09/22/2023) for f/u Dr. Judeth Horn.   Karren Burly, MD 06/22/2023

## 2023-06-23 ENCOUNTER — Telehealth: Payer: Self-pay | Admitting: Gastroenterology

## 2023-06-23 NOTE — Telephone Encounter (Signed)
 Patient daughter requesting to speak with a nurse in regards to her mom having loose stool and acid reflux. Please advise.   Thank you

## 2023-06-23 NOTE — Telephone Encounter (Signed)
 Left message on machine to call back

## 2023-06-23 NOTE — Telephone Encounter (Signed)
 The pt has a history of ulcer, loose stools and reflux.  Last seen in March on 2024.  She is on omeprazole 20 mg daily.  Appt was made for May.  I have moved her up sooner to 07/06/23 with Shanda Bumps.  She will have the pt follow antireflux precautions and call back if needed prior to the appt.

## 2023-07-05 DIAGNOSIS — R269 Unspecified abnormalities of gait and mobility: Secondary | ICD-10-CM | POA: Diagnosis not present

## 2023-07-06 ENCOUNTER — Encounter: Payer: Self-pay | Admitting: Gastroenterology

## 2023-07-06 ENCOUNTER — Ambulatory Visit: Admitting: Gastroenterology

## 2023-07-06 ENCOUNTER — Encounter: Payer: Self-pay | Admitting: Pulmonary Disease

## 2023-07-06 VITALS — BP 126/82 | HR 58 | Ht 63.0 in | Wt 245.1 lb

## 2023-07-06 DIAGNOSIS — R197 Diarrhea, unspecified: Secondary | ICD-10-CM | POA: Diagnosis not present

## 2023-07-06 DIAGNOSIS — R1013 Epigastric pain: Secondary | ICD-10-CM | POA: Diagnosis not present

## 2023-07-06 DIAGNOSIS — R142 Eructation: Secondary | ICD-10-CM | POA: Diagnosis not present

## 2023-07-06 DIAGNOSIS — R1310 Dysphagia, unspecified: Secondary | ICD-10-CM | POA: Insufficient documentation

## 2023-07-06 NOTE — Progress Notes (Signed)
 ____________________________________________________________  Attending physician addendum:  Thank you for sending this case to me. I have reviewed the entire note and agree with the plan.   Amada Jupiter, MD  ____________________________________________________________

## 2023-07-06 NOTE — Patient Instructions (Signed)
 Your provider has ordered "[employer]" stool testing for you. You have received a kit from our office today containing all necessary supplies to complete this test. Please carefully read the stool collection instructions provided in the kit before opening the accompanying materials. In addition, be sure there is a label providing your full name and date of birth on the "puritan opti-swab" tube that is supplied in the kit (if you do not see a label with this information on your test tube, please make [place] aware before test collection!). After completing the test, you should secure the purtian tube into the specimen biohazard bag. The [facility] E-Req sheet (including date and time of specimen collection) should be placed into the outside pocket of the specimen biohazard bag and returned to the [place] lab (basement floor of [facility]) within 3 days of collection. Please make sure to give the specimen to a staff member at the lab. DO NOT leave the specimen on the counter.   If the specimen date and time (can be found in the upper right boxed portion of the sheet) are not filled out on the E-Req sheet, the test will NOT be performed.   You have been scheduled for an Upper GI Series at [facility]. Your appointment is on Friday 07/15/23 at 9 am. Please arrive 30 minutes prior to your test for registration. Make sure not to eat or drink anything after midnight on the night before your test. If you need to reschedule, please call radiology at [phone]. ________________________________________________________________ An upper GI series uses x rays to help diagnose problems of the upper GI tract, which includes the esophagus, stomach, and duodenum. The duodenum is the first part of the small intestine. An upper GI series is conducted by a radiology technologist or a radiologist--a doctor who specializes in x-ray imaging--at a hospital or outpatient center. While sitting or  standing in front of an x-ray machine, the patient drinks barium liquid, which is often white and has a chalky consistency and taste. The barium liquid coats the lining of the upper GI tract and makes signs of disease show up more clearly on x rays. X-ray video, called fluoroscopy, is used to view the barium liquid moving through the esophagus, stomach, and duodenum. Additional x rays and fluoroscopy are performed while the patient lies on an x-ray table. To fully coat the upper GI tract with barium liquid, the technologist or radiologist may press on the abdomen or ask the patient to change position. Patients hold still in various positions, allowing the technologist or radiologist to take x rays of the upper GI tract at different angles. If a technologist conducts the upper GI series, a radiologist will later examine the images to look for problems.  This test typically takes about 1 hour to complete. __________________________________________________________________

## 2023-07-06 NOTE — Progress Notes (Signed)
 07/06/2023 Shirley [name] [id number] 06-12-1940   HISTORY OF PRESENT ILLNESS: This is an 83 year old female who is a patient of Dr. Irving Burton, last seen about a year ago.  She has a history of bleeding ulcer in number of 2020.  Was found to be H. pylori positive so was treated with amoxicillin and clarithromycin.  Repeat upper endoscopy August 2020 showed that the ulcer had healed and biopsies confirmed eradication of H. pylori.  She has history of DVT and is maintained on chronic OAC in the form of Eliquis.  She is here today with a couple of different complaints.  First she reports issues with diarrhea.  This is new since the beginning of March.  Not having diarrhea every day, but every time she does have a bowel movement it is diarrhea, sometimes 3-4 times in a day with incontinence.  Prior to that stools were normal.  No changes in diet or medication.  She was on an antibiotic in January for some type of upper respiratory infection.   She also reports intermittently feeling like food is getting stuck in her esophagus for the past several months.  Does not happen every time that she eats and no issues with swallowing liquids.  She is on omeprazole 20 mg daily.  Does not report a lot of heartburn or reflux, but does report intermittent epigastric abdominal pain and says that it will get bloated in her upper abdomen.  She has a lot of belching as well that comes even when not eating or drinking.  Past Medical History:  Diagnosis Date   BMI 40.0-44.9, adult (HCC)    CAD in native artery, stent to RCA BMS with MI 2015 11/04/2003   a. inferior STEMI 01/2014 s/p BMS to prox RCA.   DVT (deep venous thrombosis) (HCC) 2003   lower extremity DVT with questionable recurrence in 2005. Another admission 2015.   Endometrial cancer (HCC) 2021   Endometrial thickening on ultra sound 07/2009   path showing Fragments of benign endocervical and squamous mucosa. No dysplasia or malignancy // Followed by  Dr. Miguel Aschoff   First degree AV block    Gastric ulcer    GERD (gastroesophageal reflux disease)    GI bleed    Hemorrhoid    Hyperlipidemia LDL goal <70    Hypertension    Insomnia 11/02/2010   Memory difficulties 11/02/2010   Mild dilation of ascending aorta (HCC)    Morbid obesity (HCC) 09/16/2012   Obstructive sleep apnea 09/23/2010   Pre-diabetes    Pulmonary nodule    ST elevation myocardial infarction (STEMI) of inferior wall (HCC) 01/24/2014   Stroke (HCC) 2015   Syncope 12/18/2013   Syncope, near 12/14/2018   TIA (transient ischemic attack) 09/23/2010   09/13/2010: CT, MRI/MRA no acute process. Carotid doppler: no stenosis. 2D-Echo Normal LV function with an EF 55-60%.    Trifascicular block    UTI (urinary tract infection) 01/27/2014   Past Surgical History:  Procedure Laterality Date   COLONOSCOPY W/ POLYPECTOMY  11/01/2007   8 mm rectal adenoma, hemorrhoids   CORONARY ANGIOPLASTY WITH STENT PLACEMENT  01/24/14   Inf. STEMI, BMS to RCA   DILATION AND CURETTAGE OF UTERUS N/A 03/28/2019   Procedure: DILATATION AND CURETTAGE OF UTERUS;  Surgeon: Carver Fila, MD;  Location: [facility];  Service: Gynecology;  Laterality: N/A;   ESOPHAGOGASTRODUODENOSCOPY N/A 10/06/2018   Procedure: ESOPHAGOGASTRODUODENOSCOPY (EGD);  Surgeon: Sherrilyn Rist, MD;  Location: [facility]  ENDOSCOPY;  Service: Gastroenterology;  Laterality: N/A;   HOT HEMOSTASIS N/A 10/06/2018   Procedure: HOT HEMOSTASIS (ARGON PLASMA COAGULATION/BICAP);  Surgeon: Sherrilyn Rist, MD;  Location: [facility] ENDOSCOPY;  Service: Gastroenterology;  Laterality: N/A;   INTRAUTERINE DEVICE (IUD) INSERTION N/A 03/28/2019   Procedure: INTRAUTERINE DEVICE (IUD) INSERTION;  Surgeon: Carver Fila, MD;  Location: [facility];  Service: Gynecology;  Laterality: N/A;   KNEE SURGERY  1996   Left knee arthroscopy.   LEFT HEART CATHETERIZATION WITH CORONARY ANGIOGRAM N/A 01/24/2014   Procedure: LEFT HEART CATHETERIZATION WITH CORONARY  ANGIOGRAM;  Surgeon: Kathleene Hazel, MD;  Location: [facility]ATH [facility];  Service: Cardiovascular;  Laterality: N/A;   ROBOTIC ASSISTED TOTAL HYSTERECTOMY WITH BILATERAL SALPINGO OOPHERECTOMY N/A 09/11/2019   Procedure: XI ROBOTIC ASSISTED TOTAL HYSTERECTOMY WITH BILATERAL SALPINGO OOPHORECTOMY, LYMPH NODE DISSECTION;  Surgeon: Carver Fila, MD;  Location: [facility];  Service: Gynecology;  Laterality: N/A;   ROTATOR CUFF REPAIR     right.   SCLEROTHERAPY  10/06/2018   Procedure: SCLEROTHERAPY;  Surgeon: Sherrilyn Rist, MD;  Location: [facility] ENDOSCOPY;  Service: Gastroenterology;;   SENTINEL NODE BIOPSY N/A 09/11/2019   Procedure: SENTINEL NODE BIOPSY;  Surgeon: Carver Fila, MD;  Location: [facility];  Service: Gynecology;  Laterality: N/A;   TUBAL LIGATION      reports that she has never smoked. She has never used smokeless tobacco. She reports that she does not drink alcohol and does not use drugs. family history includes Alzheimer's disease in her maternal grandmother; Angina in her father; Cirrhosis in her daughter; Dementia in her mother; Diabetes in her mother; Heart attack in her father; Heart disease in her brother and mother; Hyperlipidemia in her mother; Liver cancer in her sister; Prostate cancer (age of onset: 19) in her brother; Thyroid cancer in her sister. Allergies  Allergen Reactions   Iohexol Rash and Other (See Comments)    Patient stated a rash appeared after a CT scan    Hydrochlorothiazide Other (See Comments)    12/18/21: Palpitations and SOB per the pt - AS      Outpatient Encounter Medications as of 07/06/2023  Medication Sig   acetaminophen (TYLENOL) 500 MG tablet Take 500 mg by mouth 2 (two) times daily as needed (for pain).   albuterol (PROVENTIL) (2.5 MG/3ML) 0.083% nebulizer solution Take 3 mLs (2.5 mg total) by nebulization every 4 (four) hours as needed for wheezing or shortness of breath.   albuterol (VENTOLIN HFA) 108 (90 Base) MCG/ACT inhaler INHALE 2 PUFFS  INTO THE LUNGS EVERY 4 HOURS AS NEEDED FOR WHEEZING OR SHORTNESS OF BREATH.   amLODipine (NORVASC) 10 MG tablet Take 10 mg by mouth daily.   arformoterol (BROVANA) 15 MCG/2ML NEBU Take 2 mLs (15 mcg total) by nebulization 2 (two) times daily.   atorvastatin (LIPITOR) 80 MG tablet TAKE 1 TABLET BY MOUTH EVERY DAY   budesonide (PULMICORT) 0.5 MG/2ML nebulizer solution Take 2 mLs (0.5 mg total) by nebulization 2 (two) times daily.   busPIRone (BUSPAR) 7.5 MG tablet Take 7.5 mg by mouth 2 (two) times daily.   cefUROXime (CEFTIN) 500 MG tablet Take 500 mg by mouth 2 (two) times daily.   cetirizine (ZYRTEC) 10 MG tablet Take 10 mg by mouth daily.   cholecalciferol (VITAMIN D3) 25 MCG (1000 UNIT) tablet Take 1,000 Units by mouth daily.   ELIQUIS 2.5 MG TABS tablet TAKE 1 TABLET BY MOUTH TWICE A DAY   Ferrous Gluconate 239 (27 Fe) MG  TABS Take by mouth.   ferrous sulfate 325 (65 FE) MG tablet Take 325 mg by mouth daily with breakfast.   furosemide (LASIX) 40 MG tablet Take 1 tablet (40 mg total) by mouth daily.   ibuprofen (ADVIL) 200 MG tablet Take 200 mg by mouth every 6 (six) hours as needed for headache or moderate pain.   metFORMIN (GLUCOPHAGE-XR) 500 MG 24 hr tablet Take 500 mg by mouth daily with breakfast.   metoprolol succinate (TOPROL XL) 50 MG 24 hr tablet Take 1 tablet (50 mg total) by mouth daily. Take with or immediately following a meal.   Multiple Vitamin (MULTIVITAMIN WITH MINERALS) TABS Take 1 tablet by mouth every morning.   MULTIPLE VITAMIN PO 1 tablet Orally Once a day   Naphazoline HCl (CLEAR EYES OP) Place 1 drop into both eyes as needed (for irritation or dryness).    nitroGLYCERIN (NITROSTAT) 0.4 MG SL tablet Place 1 tablet (0.4 mg total) under the tongue every 5 (five) minutes x 3 doses as needed for chest pain.   omeprazole (PRILOSEC) 20 MG capsule Take 1 capsule (20 mg total) by mouth every other day. (Patient taking differently: Take 20 mg by mouth daily.)   potassium  chloride (KLOR-CON) 10 MEQ tablet Take 1 tablet (10 mEq total) by mouth daily.   potassium chloride SA (KLOR-CON M) 20 MEQ tablet Take 1 tablet (20 mEq total) by mouth daily.   Pseudoeph-Bromphen-DM (BROMFED DM PO) Take 5 mLs by mouth 3 (three) times daily.   sodium chloride (OCEAN) 0.65 % SOLN nasal spray Place 1 spray into both nostrils as needed for congestion.   Spacer/Aero-Holding Chambers (AEROCHAMBER MV) inhaler Use as instructed   telmisartan (MICARDIS) 80 MG tablet Take 80 mg by mouth daily.   No facility-administered encounter medications on file as of 07/06/2023.    REVIEW OF SYSTEMS  : All other systems reviewed and negative except where noted in the History of Present Illness.   PHYSICAL EXAM: BP 126/82   Pulse (!) 58   Ht 5\' 3"  (1.6 m)   Wt 245 lb 2 oz (111.2 kg)   BMI 43.42 kg/m  General: Well developed female in no acute distress Head: Normocephalic and atraumatic Eyes:  Sclerae anicteric, conjunctiva pink. Ears: Normal auditory acuity. Heart:  Slightly bradycardic with regular rhythm.  No M/R/G. Lungs:  CTAB.  No W/R/R. Abdomen: Soft, non-distended.  BS present.  Non-tender. Musculoskeletal: Symmetrical with no gross deformities  Skin: No lesions on visible extremities Extremities: No edema  Neurological: Alert oriented x 4, grossly non-focal Psychological:  Alert and cooperative. Normal mood and affect  ASSESSMENT AND PLAN: *Diarrhea: This is new since the beginning of March.  Not having diarrhea every day, but every time she does have a bowel movement it is diarrhea, sometimes 3-4 times in a day with incontinence.  Prior to that stools were normal.  No changes in diet or medication.  She was on an antibiotic in January for some type of upper respiratory infection.  Will check a stool Diatherix for C. difficile. *Dysphagia: Intermittently feeling like food is getting stuck in her esophagus for the past several months.  Does not happen every time that she eats and no  issues with swallowing liquids.  Will check an upper GI study to rule out stricture versus esophageal dysmotility, etc. *Epigastric abdominal pain and belching: Reports intermittent upper abdominal pain with bloating in that area.  Describes a lot of belching as well that comes even when not eating  or drinking.  Will start evaluation with upper GI study.  CC:  Georgianne Fick, MD

## 2023-07-07 DIAGNOSIS — R269 Unspecified abnormalities of gait and mobility: Secondary | ICD-10-CM | POA: Diagnosis not present

## 2023-07-11 DIAGNOSIS — R269 Unspecified abnormalities of gait and mobility: Secondary | ICD-10-CM | POA: Diagnosis not present

## 2023-07-13 DIAGNOSIS — I10 Essential (primary) hypertension: Secondary | ICD-10-CM | POA: Diagnosis not present

## 2023-07-13 LAB — BASIC METABOLIC PANEL WITH GFR
BUN/Creatinine Ratio: 15 (ref 12–28)
BUN: 9 mg/dL (ref 8–27)
CO2: 24 mmol/L (ref 20–29)
Calcium: 8.9 mg/dL (ref 8.7–10.3)
Chloride: 102 mmol/L (ref 96–106)
Creatinine, Ser: 0.62 mg/dL (ref 0.57–1.00)
Glucose: 92 mg/dL (ref 70–99)
Potassium: 4 mmol/L (ref 3.5–5.2)
Sodium: 141 mmol/L (ref 134–144)
eGFR: 88 mL/min/{1.73_m2} (ref >59)

## 2023-07-14 DIAGNOSIS — R269 Unspecified abnormalities of gait and mobility: Secondary | ICD-10-CM | POA: Diagnosis not present

## 2023-07-15 ENCOUNTER — Other Ambulatory Visit: Payer: Self-pay | Admitting: Gastroenterology

## 2023-07-15 ENCOUNTER — Ambulatory Visit (HOSPITAL_COMMUNITY)
Admission: RE | Admit: 2023-07-15 | Discharge: 2023-07-15 | Disposition: A | Discharge status: Home / Self Care | Source: Ambulatory Visit | Attending: Gastroenterology | Admitting: Gastroenterology

## 2023-07-15 DIAGNOSIS — R142 Eructation: Secondary | ICD-10-CM | POA: Insufficient documentation

## 2023-07-15 DIAGNOSIS — R197 Diarrhea, unspecified: Secondary | ICD-10-CM

## 2023-07-15 DIAGNOSIS — R1013 Epigastric pain: Secondary | ICD-10-CM | POA: Diagnosis not present

## 2023-07-15 DIAGNOSIS — K449 Diaphragmatic hernia without obstruction or gangrene: Secondary | ICD-10-CM | POA: Diagnosis not present

## 2023-07-15 DIAGNOSIS — R1310 Dysphagia, unspecified: Secondary | ICD-10-CM

## 2023-07-18 ENCOUNTER — Ambulatory Visit: Payer: Self-pay | Admitting: Pulmonary Disease

## 2023-07-18 DIAGNOSIS — J4541 Moderate persistent asthma with (acute) exacerbation: Secondary | ICD-10-CM

## 2023-07-18 MED ORDER — PREDNISONE 10 MG PO TABS: ORAL_TABLET | ORAL | 0 refills | Status: DC

## 2023-07-18 NOTE — Telephone Encounter (Signed)
 Prednisone taper. 4 tabs for 2 days, then 3 tabs for 2 days, 2 tabs for 2 days, then 1 tab for 2 days, then stop. Take in AM with food. Rx sent to pharmacy. Needs to go to ED if symptoms fail to improve or worsen.

## 2023-07-18 NOTE — Addendum Note (Signed)
 Addended by: Hazleigh Mccleave V on: 07/18/2023 03:11 PM   Modules accepted: Orders

## 2023-07-18 NOTE — Telephone Encounter (Signed)
 Copied from CRM [id number]. Topic: Clinical - Red Word Triage >> Jul 18, 2023 10:38 AM Adele Barthel wrote: Red Word that prompted transfer to Nurse Triage:   Having visible wheezing, shortness of breath, and cough for past few days. Nebulizer treatments every 4 hours, also using emergency inhaler.  Using both more frequently, call back in if any issues per [place] Daughter on phone  TRIAGE SUMMARY NOTE: Pt daughter reporting that pt is having moderate SOB, SOB a rest with audible wheezing at times, coughing, pt daughter reporting that she is worse today than yesterday though using her nebulizer meds and inhaler. Pt daughter confirms pt is using both budesonide and albuterol nebulizer solutions every 4 hours in the machine at the same time, though pt is prescribed to use budesonide only 2x per day, pt also using albuterol inhaler more frequently, used inhaler 3x yesterday and once so far today, pt gets some relief with these treatments "for a little while." Advised ED for worsening SOB at rest and hx of blood clot/significant cardiac hx, pt daughter stating pt will not be going to ED, daughter stating that Dr. Judeth Horn told them to just call if worsening, last seen by Dr. Judeth Horn for asthma on 3/19. Nurse advised that sending HP message over to pulm to see about getting pt scheduled per pt daughter request, advised ED if any worsening or new symptoms. Pt daughter verbalized understanding. Please advise.  E2C2 Pulmonary Triage - Initial Assessment Questions "Chief Complaint (e.g., cough, sob, wheezing, fever, chills, sweat or additional symptoms) *Go to specific symptom protocol after initial questions. More SOB than normal even at rest, audible wheezing, cough Worse than she was yesterday Wheezing right now No dizziness, weakness, chest pain Being SOB is not her normal Avoiding ED  "How long have symptoms been present?" Past few days  "Have you used your inhalers/maintenance medication?"  Yes If yes, "What medications?" Both nebulizer meds been using budesonide and albuterol solutions in nebulizer every 4 hours, help for a little while  If inhaler, ask "How many puffs and how often?" Note: Review instructions on medication in the chart. rescue inhaler whenever she can hear her wheezing, used 1x so far, yesterday used 3x, help for a little while  OXYGEN: "Do you wear supplemental oxygen?" No  "Do you monitor your oxygen levels?" No If yes, "What is your reading (oxygen level) today?" 98%  "What is your usual oxygen saturation reading?"  (Note: Pulmonary O2 sats should be 90% or greater) 97-98%  Reason for Disposition  [1] MODERATE difficulty breathing (e.g., speaks in phrases, SOB even at rest, pulse 100-120) AND [2] NEW-onset or WORSE than normal  Answer Assessment - Initial Assessment Questions 3. PATTERN "Does the difficult breathing come and go, or has it been constant since it started?"      Comes and goes, machine helps spread it out 4. SEVERITY: "How bad is your breathing?" (e.g., mild, moderate, severe)    - MILD: No SOB at rest, mild SOB with walking, speaks normally in sentences, can lie down, no retractions, pulse < 100.    - MODERATE: SOB at rest, SOB with minimal exertion and prefers to sit, cannot lie down flat, speaks in phrases, mild retractions, audible wheezing, pulse 100-120.    - SEVERE: Very SOB at rest, speaks in single words, struggling to breathe, sitting hunched forward, retractions, pulse > 120      With movement having even more trouble 6. CARDIAC HISTORY: "Do you have any history of heart disease?" (  e.g., heart attack, angina, bypass surgery, angioplasty)      significant 7. LUNG HISTORY: "Do you have any history of lung disease?"  (e.g., pulmonary embolus, asthma, emphysema)     Sleep apnea 9. OTHER SYMPTOMS: "Do you have any other symptoms? (e.g., dizziness, runny nose, cough, chest pain, fever)     Back pain from side to side in middle  back  Protocols used: Breathing Difficulty-A-AH

## 2023-07-18 NOTE — Telephone Encounter (Signed)
 I called and spoke to pt daughter, Adah Acron. [facility]) Adah Acron informed of Katie's note and verbalized understanding. NFN

## 2023-07-19 ENCOUNTER — Observation Stay (HOSPITAL_BASED_OUTPATIENT_CLINIC_OR_DEPARTMENT_OTHER)
Admission: EM | Admit: 2023-07-19 | Discharge: 2023-07-22 | Disposition: A | Discharge status: Home / Self Care | Attending: Internal Medicine | Admitting: Internal Medicine

## 2023-07-19 ENCOUNTER — Emergency Department (HOSPITAL_BASED_OUTPATIENT_CLINIC_OR_DEPARTMENT_OTHER): Admitting: Radiology

## 2023-07-19 ENCOUNTER — Encounter (HOSPITAL_BASED_OUTPATIENT_CLINIC_OR_DEPARTMENT_OTHER): Payer: Self-pay | Admitting: Emergency Medicine

## 2023-07-19 ENCOUNTER — Other Ambulatory Visit: Payer: Self-pay

## 2023-07-19 DIAGNOSIS — N390 Urinary tract infection, site not specified: Secondary | ICD-10-CM | POA: Diagnosis not present

## 2023-07-19 DIAGNOSIS — Z7984 Long term (current) use of oral hypoglycemic drugs: Secondary | ICD-10-CM | POA: Diagnosis not present

## 2023-07-19 DIAGNOSIS — J45901 Unspecified asthma with (acute) exacerbation: Principal | ICD-10-CM | POA: Diagnosis present

## 2023-07-19 DIAGNOSIS — Z79899 Other long term (current) drug therapy: Secondary | ICD-10-CM | POA: Diagnosis not present

## 2023-07-19 DIAGNOSIS — Z8542 Personal history of malignant neoplasm of other parts of uterus: Secondary | ICD-10-CM | POA: Insufficient documentation

## 2023-07-19 DIAGNOSIS — Z86718 Personal history of other venous thrombosis and embolism: Secondary | ICD-10-CM | POA: Insufficient documentation

## 2023-07-19 DIAGNOSIS — M7989 Other specified soft tissue disorders: Secondary | ICD-10-CM

## 2023-07-19 DIAGNOSIS — Z7901 Long term (current) use of anticoagulants: Secondary | ICD-10-CM | POA: Insufficient documentation

## 2023-07-19 DIAGNOSIS — Z8673 Personal history of transient ischemic attack (TIA), and cerebral infarction without residual deficits: Secondary | ICD-10-CM | POA: Insufficient documentation

## 2023-07-19 DIAGNOSIS — I2510 Atherosclerotic heart disease of native coronary artery without angina pectoris: Secondary | ICD-10-CM | POA: Insufficient documentation

## 2023-07-19 DIAGNOSIS — R413 Other amnesia: Secondary | ICD-10-CM | POA: Insufficient documentation

## 2023-07-19 DIAGNOSIS — E785 Hyperlipidemia, unspecified: Secondary | ICD-10-CM | POA: Insufficient documentation

## 2023-07-19 DIAGNOSIS — I10 Essential (primary) hypertension: Secondary | ICD-10-CM | POA: Diagnosis not present

## 2023-07-19 DIAGNOSIS — Z1152 Encounter for screening for COVID-19: Secondary | ICD-10-CM | POA: Diagnosis not present

## 2023-07-19 DIAGNOSIS — R0602 Shortness of breath: Secondary | ICD-10-CM | POA: Diagnosis not present

## 2023-07-19 LAB — TROPONIN I (HIGH SENSITIVITY)
Troponin I (High Sensitivity): 5 ng/L (ref <18)
Troponin I (High Sensitivity): 5 ng/L (ref <18)

## 2023-07-19 LAB — CBC WITH DIFFERENTIAL/PLATELET
Abs Immature Granulocytes: 0.04 10*3/uL (ref 0.00–0.07)
Basophils Absolute: 0.1 10*3/uL (ref 0.0–0.1)
Basophils Relative: 0 %
Eosinophils Absolute: 0 10*3/uL (ref 0.0–0.5)
Eosinophils Relative: 0 %
HCT: 40.9 % (ref 36.0–46.0)
Hemoglobin: 13.8 g/dL (ref 12.0–15.0)
Immature Granulocytes: 0 %
Lymphocytes Relative: 17 %
Lymphs Abs: 2 10*3/uL (ref 0.7–4.0)
MCH: 29.1 pg (ref 26.0–34.0)
MCHC: 33.7 g/dL (ref 30.0–36.0)
MCV: 86.1 fL (ref 80.0–100.0)
Monocytes Absolute: 0.5 10*3/uL (ref 0.1–1.0)
Monocytes Relative: 4 %
Neutro Abs: 9.1 10*3/uL — ABNORMAL HIGH (ref 1.7–7.7)
Neutrophils Relative %: 79 %
Platelets: 208 10*3/uL (ref 150–400)
RBC: 4.75 MIL/uL (ref 3.87–5.11)
RDW: 13.2 % (ref 11.5–15.5)
WBC: 11.6 10*3/uL — ABNORMAL HIGH (ref 4.0–10.5)
nRBC: 0 % (ref 0.0–0.2)

## 2023-07-19 LAB — BASIC METABOLIC PANEL WITH GFR
Anion gap: 8 (ref 5–15)
BUN: 13 mg/dL (ref 8–23)
CO2: 26 mmol/L (ref 22–32)
Calcium: 9.6 mg/dL (ref 8.9–10.3)
Chloride: 101 mmol/L (ref 98–111)
Creatinine, Ser: 0.54 mg/dL (ref 0.44–1.00)
GFR, Estimated: >60 mL/min (ref >60)
Glucose, Bld: 148 mg/dL — ABNORMAL HIGH (ref 70–99)
Potassium: 4.3 mmol/L (ref 3.5–5.1)
Sodium: 135 mmol/L (ref 135–145)

## 2023-07-19 LAB — I-STAT VENOUS BLOOD GAS, ED
Acid-Base Excess: 2 mmol/L (ref 0.0–2.0)
Bicarbonate: 25.5 mmol/L (ref 20.0–28.0)
Calcium, Ion: 1.26 mmol/L (ref 1.15–1.40)
HCT: 38 % (ref 36.0–46.0)
Hemoglobin: 12.9 g/dL (ref 12.0–15.0)
O2 Saturation: 80 %
Patient temperature: 98.6
Potassium: 4 mmol/L (ref 3.5–5.1)
Sodium: 136 mmol/L (ref 135–145)
TCO2: 27 mmol/L (ref 22–32)
pCO2, Ven: 37.1 mmHg — ABNORMAL LOW (ref 44–60)
pH, Ven: 7.446 — ABNORMAL HIGH (ref 7.25–7.43)
pO2, Ven: 42 mmHg (ref 32–45)

## 2023-07-19 LAB — D-DIMER, QUANTITATIVE: D-Dimer, Quant: 0.57 ug{FEU}/mL — ABNORMAL HIGH (ref 0.00–0.50)

## 2023-07-19 LAB — RESP PANEL BY RT-PCR (RSV, FLU A&B, COVID)  RVPGX2
Influenza A by PCR: NEGATIVE
Influenza B by PCR: NEGATIVE
Resp Syncytial Virus by PCR: NEGATIVE
SARS Coronavirus 2 by RT PCR: NEGATIVE

## 2023-07-19 LAB — BRAIN NATRIURETIC PEPTIDE: B Natriuretic Peptide: 181.1 pg/mL — ABNORMAL HIGH (ref 0.0–100.0)

## 2023-07-19 LAB — GLUCOSE, CAPILLARY: Glucose-Capillary: 267 mg/dL — ABNORMAL HIGH (ref 70–99)

## 2023-07-19 MED ORDER — DM-GUAIFENESIN ER 30-600 MG PO TB12
1.0000 | ORAL_TABLET | Freq: Two times a day (BID) | ORAL | Status: DC
  Administered 2023-07-19 – 2023-07-22 (×6): 1 via ORAL
  Filled 2023-07-19 (×6): qty 1

## 2023-07-19 MED ORDER — APIXABAN 2.5 MG PO TABS
2.5000 mg | ORAL_TABLET | Freq: Two times a day (BID) | ORAL | Status: DC
Start: 2023-07-19 — End: 2023-07-22
  Administered 2023-07-19 – 2023-07-22 (×6): 2.5 mg via ORAL
  Filled 2023-07-19 (×6): qty 1

## 2023-07-19 MED ORDER — FUROSEMIDE 10 MG/ML IJ SOLN: 40.0000 mg | Freq: Every day | INTRAMUSCULAR | Status: DC

## 2023-07-19 MED ORDER — IPRATROPIUM-ALBUTEROL 0.5-2.5 (3) MG/3ML IN SOLN
3.0000 mL | RESPIRATORY_TRACT | Status: AC
  Administered 2023-07-19 (×2): 3 mL via RESPIRATORY_TRACT
  Filled 2023-07-19: qty 6

## 2023-07-19 MED ORDER — BUSPIRONE HCL 5 MG PO TABS
7.5000 mg | ORAL_TABLET | Freq: Two times a day (BID) | ORAL | Status: DC
  Administered 2023-07-19 – 2023-07-22 (×6): 7.5 mg via ORAL
  Filled 2023-07-19 (×6): qty 2

## 2023-07-19 MED ORDER — IPRATROPIUM-ALBUTEROL 0.5-2.5 (3) MG/3ML IN SOLN
3.0000 mL | Freq: Once | RESPIRATORY_TRACT | Status: AC
  Administered 2023-07-19: 3 mL via RESPIRATORY_TRACT
  Filled 2023-07-19: qty 3

## 2023-07-19 MED ORDER — METHYLPREDNISOLONE SODIUM SUCC 40 MG IJ SOLR
40.0000 mg | Freq: Two times a day (BID) | INTRAMUSCULAR | Status: AC
  Administered 2023-07-19 – 2023-07-22 (×6): 40 mg via INTRAVENOUS
  Filled 2023-07-19 (×6): qty 1

## 2023-07-19 MED ORDER — INSULIN ASPART 100 UNIT/ML IJ SOLN
0.0000 [IU] | Freq: Three times a day (TID) | INTRAMUSCULAR | Status: DC
  Administered 2023-07-20: 5 [IU] via SUBCUTANEOUS
  Administered 2023-07-20: 8 [IU] via SUBCUTANEOUS
  Administered 2023-07-20 – 2023-07-21 (×3): 3 [IU] via SUBCUTANEOUS
  Administered 2023-07-21 – 2023-07-22 (×3): 5 [IU] via SUBCUTANEOUS

## 2023-07-19 MED ORDER — MAGNESIUM SULFATE 2 GM/50ML IV SOLN
2.0000 g | Freq: Once | INTRAVENOUS | Status: AC
  Administered 2023-07-19: 2 g via INTRAVENOUS
  Filled 2023-07-19: qty 50

## 2023-07-19 MED ORDER — ACETAMINOPHEN 650 MG RE SUPP: 650.0000 mg | Freq: Four times a day (QID) | RECTAL | Status: DC | PRN

## 2023-07-19 MED ORDER — METOPROLOL SUCCINATE ER 50 MG PO TB24
50.0000 mg | ORAL_TABLET | Freq: Every day | ORAL | Status: DC
  Administered 2023-07-19 – 2023-07-22 (×4): 50 mg via ORAL
  Filled 2023-07-19 (×4): qty 1

## 2023-07-19 MED ORDER — IPRATROPIUM-ALBUTEROL 0.5-2.5 (3) MG/3ML IN SOLN: 3.0000 mL | RESPIRATORY_TRACT | Status: DC

## 2023-07-19 MED ORDER — INSULIN ASPART 100 UNIT/ML IJ SOLN
0.0000 [IU] | Freq: Every day | INTRAMUSCULAR | Status: DC
  Administered 2023-07-19: 3 [IU] via SUBCUTANEOUS
  Administered 2023-07-20: 4 [IU] via SUBCUTANEOUS
  Administered 2023-07-21: 3 [IU] via SUBCUTANEOUS

## 2023-07-19 MED ORDER — ACETAMINOPHEN 325 MG PO TABS
650.0000 mg | ORAL_TABLET | Freq: Four times a day (QID) | ORAL | Status: DC | PRN
  Administered 2023-07-22: 650 mg via ORAL
  Filled 2023-07-19: qty 2

## 2023-07-19 MED ORDER — FUROSEMIDE 40 MG PO TABS: 40.0000 mg | ORAL_TABLET | Freq: Every day | ORAL | Status: DC

## 2023-07-19 MED ORDER — FLUTICASONE FUROATE-VILANTEROL 200-25 MCG/ACT IN AEPB
1.0000 | INHALATION_SPRAY | Freq: Every day | RESPIRATORY_TRACT | Status: DC
  Administered 2023-07-20 – 2023-07-22 (×3): 1 via RESPIRATORY_TRACT
  Filled 2023-07-19: qty 28

## 2023-07-19 MED ORDER — IPRATROPIUM-ALBUTEROL 0.5-2.5 (3) MG/3ML IN SOLN
3.0000 mL | RESPIRATORY_TRACT | Status: DC
  Administered 2023-07-19 – 2023-07-20 (×3): 3 mL via RESPIRATORY_TRACT
  Filled 2023-07-19 (×3): qty 3

## 2023-07-19 MED ORDER — ENOXAPARIN SODIUM 40 MG/0.4ML IJ SOSY: 40.0000 mg | PREFILLED_SYRINGE | INTRAMUSCULAR | Status: DC

## 2023-07-19 MED ORDER — HYDRALAZINE HCL 20 MG/ML IJ SOLN
10.0000 mg | INTRAMUSCULAR | Status: DC | PRN
  Administered 2023-07-19: 10 mg via INTRAVENOUS
  Filled 2023-07-19: qty 1

## 2023-07-19 MED ORDER — IRBESARTAN 300 MG PO TABS
300.0000 mg | ORAL_TABLET | Freq: Every day | ORAL | Status: DC
  Administered 2023-07-20 – 2023-07-22 (×3): 300 mg via ORAL
  Filled 2023-07-19 (×3): qty 1

## 2023-07-19 MED ORDER — PANTOPRAZOLE SODIUM 40 MG PO TBEC
40.0000 mg | DELAYED_RELEASE_TABLET | Freq: Every day | ORAL | Status: DC
  Administered 2023-07-20 – 2023-07-22 (×3): 40 mg via ORAL
  Filled 2023-07-19 (×3): qty 1

## 2023-07-19 MED ORDER — ATORVASTATIN CALCIUM 40 MG PO TABS
80.0000 mg | ORAL_TABLET | Freq: Every day | ORAL | Status: DC
  Administered 2023-07-20 – 2023-07-22 (×3): 80 mg via ORAL
  Filled 2023-07-19 (×3): qty 2

## 2023-07-19 MED ORDER — POLYETHYLENE GLYCOL 3350 17 G PO PACK: 17.0000 g | PACK | Freq: Every day | ORAL | Status: DC | PRN

## 2023-07-19 MED ORDER — METHYLPREDNISOLONE SODIUM SUCC 125 MG IJ SOLR
60.0000 mg | Freq: Once | INTRAMUSCULAR | Status: AC
  Administered 2023-07-19: 60 mg via INTRAVENOUS
  Filled 2023-07-19: qty 2

## 2023-07-19 MED ORDER — BUDESONIDE 0.5 MG/2ML IN SUSP: 0.5000 mg | Freq: Two times a day (BID) | RESPIRATORY_TRACT | Status: DC

## 2023-07-19 MED ORDER — SODIUM CHLORIDE 0.9% FLUSH
3.0000 mL | Freq: Two times a day (BID) | INTRAVENOUS | Status: DC
  Administered 2023-07-19 – 2023-07-22 (×5): 3 mL via INTRAVENOUS

## 2023-07-19 MED ORDER — FUROSEMIDE 10 MG/ML IJ SOLN
40.0000 mg | Freq: Two times a day (BID) | INTRAMUSCULAR | Status: DC
  Administered 2023-07-19 – 2023-07-22 (×6): 40 mg via INTRAVENOUS
  Filled 2023-07-19 (×6): qty 4

## 2023-07-19 MED ORDER — LABETALOL HCL 5 MG/ML IV SOLN
20.0000 mg | INTRAVENOUS | Status: DC | PRN
  Administered 2023-07-19 – 2023-07-20 (×2): 20 mg via INTRAVENOUS
  Filled 2023-07-19 (×2): qty 4

## 2023-07-19 MED ORDER — AMLODIPINE BESYLATE 5 MG PO TABS
10.0000 mg | ORAL_TABLET | Freq: Every day | ORAL | Status: DC
  Administered 2023-07-20 – 2023-07-22 (×3): 10 mg via ORAL
  Filled 2023-07-19 (×3): qty 2

## 2023-07-19 NOTE — ED Provider Notes (Signed)
 [place] EMERGENCY DEPARTMENT AT [facility] Provider Note   CSN: [id number] Arrival date & time: 07/19/23  1046     History  Chief Complaint  Patient presents with   Shortness of Breath    Shirley [name] is a 83 y.o. female.  HPI   83 year old female with medical history significant for hypertension, GERD, DVT, syncope, TIA, CAD/STEMI, OSA, morbid obesity, CVA, asthma presenting to the emerged part with roughly 1 week of dyspnea on exertion, worsening over the last few days.  She was seen by pulmonology in clinic last on 06/22/2023.  She has been on ICS/LABA via nebulizer and had been improving at her pulmonology visit who presents to the emergency department with worsening wheezing and shortness of breath.  She had recently been prescribed prednisone by her pulmonologist.  She has been doing her nebulizer treatments at home without improvement.  She denies any chest pain, endorses chest tightness that started yesterday.No fevers or chills, no known sick contacts, no unilateral leg swelling or pleuritic chest pain.   Home Medications Prior to Admission medications   Medication Sig Start Date End Date Taking? Authorizing Provider  acetaminophen (TYLENOL) 500 MG tablet Take 500 mg by mouth 2 (two) times daily as needed (for pain).    [provider]  albuterol (PROVENTIL) (2.5 MG/3ML) 0.083% nebulizer solution Take 3 mLs (2.5 mg total) by nebulization every 4 (four) hours as needed for wheezing or shortness of breath. 05/17/23   Hunsucker, Lesia Sago, MD  albuterol (VENTOLIN HFA) 108 (90 Base) MCG/ACT inhaler INHALE 2 PUFFS INTO THE LUNGS EVERY 4 HOURS AS NEEDED FOR WHEEZING OR SHORTNESS OF BREATH. 07/28/22   Hunsucker, Lesia Sago, MD  amLODipine (NORVASC) 10 MG tablet Take 10 mg by mouth daily. 03/07/21   [provider]  arformoterol (BROVANA) 15 MCG/2ML NEBU Take 2 mLs (15 mcg total) by nebulization 2 (two) times daily. 05/17/23   Hunsucker, Lesia Sago, MD   atorvastatin (LIPITOR) 80 MG tablet TAKE 1 TABLET BY MOUTH EVERY DAY 09/15/21   Kathleene Hazel, MD  budesonide (PULMICORT) 0.5 MG/2ML nebulizer solution Take 2 mLs (0.5 mg total) by nebulization 2 (two) times daily. 05/19/23   Hunsucker, Lesia Sago, MD  busPIRone (BUSPAR) 7.5 MG tablet Take 7.5 mg by mouth 2 (two) times daily. 09/21/20   [provider]  cefUROXime (CEFTIN) 500 MG tablet Take 500 mg by mouth 2 (two) times daily. 06/16/23   [provider]  cetirizine (ZYRTEC) 10 MG tablet Take 10 mg by mouth daily. 12/19/17   [provider]  cholecalciferol (VITAMIN D3) 25 MCG (1000 UNIT) tablet Take 1,000 Units by mouth daily.    [provider]  ELIQUIS 2.5 MG TABS tablet TAKE 1 TABLET BY MOUTH TWICE A DAY 06/20/23   Malachy Mood, MD  Ferrous Gluconate 239 (27 Fe) MG TABS Take by mouth.    [provider]  ferrous sulfate 325 (65 FE) MG tablet Take 325 mg by mouth daily with breakfast.    [provider]  furosemide (LASIX) 40 MG tablet Take 1 tablet (40 mg total) by mouth daily. 06/17/23   Kathleene Hazel, MD  ibuprofen (ADVIL) 200 MG tablet Take 200 mg by mouth every 6 (six) hours as needed for headache or moderate pain.    [provider]  metFORMIN (GLUCOPHAGE-XR) 500 MG 24 hr tablet Take 500 mg by mouth daily with breakfast.    [provider]  metoprolol succinate (TOPROL XL) 50  MG 24 hr tablet Take 1 tablet (50 mg total) by mouth daily. Take with or immediately following a meal. 06/17/23   Odie Benne, MD  Multiple Vitamin (MULTIVITAMIN WITH MINERALS) TABS Take 1 tablet by mouth every morning.    [provider]  Naphazoline HCl (CLEAR EYES OP) Place 1 drop into both eyes as needed (for irritation or dryness).     [provider]  nitroGLYCERIN (NITROSTAT) 0.4 MG SL tablet Place 1 tablet (0.4 mg total) under the tongue every 5 (five) minutes x 3 doses as needed for chest pain. 10/08/22    Gerald Kitty., NP  omeprazole (PRILOSEC) 20 MG capsule Take 1 capsule (20 mg total) by mouth every other day. Patient taking differently: Take 20 mg by mouth daily. 08/20/20   Albertina Hugger, MD  potassium chloride (KLOR-CON) 10 MEQ tablet Take 1 tablet (10 mEq total) by mouth daily. 09/15/21   Odie Benne, MD  potassium chloride SA (KLOR-CON M) 20 MEQ tablet Take 1 tablet (20 mEq total) by mouth daily. 06/17/23   Odie Benne, MD  predniSONE (DELTASONE) 10 MG tablet 4 tabs for 2 days, then 3 tabs for 2 days, 2 tabs for 2 days, then 1 tab for 2 days, then stop 07/18/23   Cobb, Katherine V, NP  Pseudoeph-Bromphen-DM (BROMFED DM PO) Take 5 mLs by mouth 3 (three) times daily.    [provider]  sodium chloride (OCEAN) 0.65 % SOLN nasal spray Place 1 spray into both nostrils as needed for congestion.    [provider]  Spacer/Aero-Holding Chambers (AEROCHAMBER MV) inhaler Use as instructed 12/24/21   Hunsucker, Archer Kobs, MD  telmisartan (MICARDIS) 80 MG tablet Take 80 mg by mouth daily. 02/24/21   [provider]      Allergies    Iohexol and Hydrochlorothiazide    Review of Systems   Review of Systems  All other systems reviewed and are negative.   Physical Exam Updated Vital Signs BP (!) 142/71   Pulse 84   Temp 98.6 F (37 C)   Resp 13   SpO2 94%  Physical Exam Vitals and nursing note reviewed.  Constitutional:      General: She is not in acute distress.    Appearance: She is well-developed.  HENT:     Head: Normocephalic and atraumatic.  Eyes:     Conjunctiva/sclera: Conjunctivae normal.  Cardiovascular:     Rate and Rhythm: Normal rate and regular rhythm.     Heart sounds: No murmur heard. Pulmonary:     Effort: Pulmonary effort is normal. No tachypnea or respiratory distress.     Breath sounds: Examination of the right-upper field reveals wheezing. Examination of the left-upper field reveals wheezing. Examination of  the right-middle field reveals wheezing. Examination of the left-middle field reveals wheezing. Examination of the right-lower field reveals wheezing. Examination of the left-lower field reveals wheezing. Wheezing present.  Abdominal:     Palpations: Abdomen is soft.     Tenderness: There is no abdominal tenderness.  Musculoskeletal:        General: No swelling.     Cervical back: Neck supple.  Skin:    General: Skin is warm and dry.     Capillary Refill: Capillary refill takes less than 2 seconds.  Neurological:     Mental Status: She is alert.  Psychiatric:        Mood and Affect: Mood normal.     ED Results /  Procedures / Treatments   Labs (all labs ordered are listed, but only abnormal results are displayed) Labs Reviewed  BASIC METABOLIC PANEL WITH GFR - Abnormal; Notable for the following components:      Result Value   Glucose, Bld 148 (*)    All other components within normal limits  CBC WITH DIFFERENTIAL/PLATELET - Abnormal; Notable for the following components:   WBC 11.6 (*)    Neutro Abs 9.1 (*)    All other components within normal limits  BRAIN NATRIURETIC PEPTIDE - Abnormal; Notable for the following components:   B Natriuretic Peptide 181.1 (*)    All other components within normal limits  D-DIMER, QUANTITATIVE - Abnormal; Notable for the following components:   D-Dimer, Quant 0.57 (*)    All other components within normal limits  I-STAT VENOUS BLOOD GAS, ED - Abnormal; Notable for the following components:   pH, Ven 7.446 (*)    pCO2, Ven 37.1 (*)    All other components within normal limits  RESP PANEL BY RT-PCR (RSV, FLU A&B, COVID)  [id number]  TROPONIN I (HIGH SENSITIVITY)  TROPONIN I (HIGH SENSITIVITY)    EKG EKG Interpretation Date/Time:  Tuesday July 19 2023 11:20:46 EDT Ventricular Rate:  80 PR Interval:  260 QRS Duration:  150 QT Interval:  397 QTC Calculation: 458 R Axis:   -49  Text Interpretation: Sinus rhythm Prolonged PR interval RBBB  and LAFB Probable left ventricular hypertrophy Confirmed by Ernie Avena ([id number]) on 07/19/2023 11:43:51 AM  Radiology DG Chest 2 View Result Date: 07/19/2023 CLINICAL DATA:  Shortness of breath. EXAM: CHEST - 2 VIEW COMPARISON:  06/22/2023. FINDINGS: Bilateral lung fields are clear. Bilateral costophrenic angles are clear. Stable cardio-mediastinal silhouette. No acute osseous abnormalities. The soft tissues are within normal limits. IMPRESSION: *No active cardiopulmonary disease. Electronically Signed   By: Jules Schick M.D.   On: 07/19/2023 11:55    Procedures Procedures    Medications Ordered in ED Medications  ipratropium-albuterol (DUONEB) 0.5-2.5 (3) MG/3ML nebulizer solution 3 mL (3 mLs Nebulization Given 07/19/23 1110)  ipratropium-albuterol (DUONEB) 0.5-2.5 (3) MG/3ML nebulizer solution 3 mL (3 mLs Nebulization Given 07/19/23 1243)  methylPREDNISolone sodium succinate (SOLU-MEDROL) 125 mg/2 mL injection 60 mg (60 mg Intravenous Given 07/19/23 1237)  magnesium sulfate IVPB 2 g 50 mL (0 g Intravenous Stopped 07/19/23 1337)    ED Course/ Medical Decision Making/ A&P                                 Medical Decision Making Amount and/or Complexity of Data Reviewed Labs: ordered. Radiology: ordered.  Risk Prescription drug management. Decision regarding hospitalization.     83 year old female with medical history significant for hypertension, GERD, DVT, syncope, TIA, CAD/STEMI, OSA, morbid obesity, CVA, asthma presenting to the emerged part with roughly 1 week of dyspnea on exertion, worsening over the last few days.  She was seen by pulmonology in clinic last on 06/22/2023.  She has been on ICS/LABA via nebulizer and had been improving at her pulmonology visit who presents to the emergency department with worsening wheezing and shortness of breath.  She had recently been prescribed prednisone by her pulmonologist.  She has been doing her nebulizer treatments at home without  improvement.  She denies any chest pain, endorses chest tightness that started yesterday.No fevers or chills, no known sick contacts, no unilateral leg swelling or pleuritic chest pain.   On arrival, the patient was  afebrile, not tachycardic, mildly tachypneic RR 22, saturating 99% on room air, initially hypertensive BP 190/92, improved to 149/76 without intervention.  Patient on exam noted to be diffusely wheezing in all lung fields, administered DuoNebs x 3.  IV access obtained the patient was administered IV magnesium and IV Solu-Medrol.  Suspect asthma exacerbation.  Considered allergic versus viral trigger.  Laboratory evaluation revealed cardiac troponin 5, D-dimer negative after adjustment for age, BNP nonspecifically mildly elevated at 181, i-STAT VBG with respiratory acidosis with a pH of 7.45, pCO2 37, BMP without significant electrolyte abnormality, CBC with nonspecific cytosis to 11.6 in the setting of the patient's outpatient prednisone use, hemoglobin 13.8, COVID, flu, RSV PCR testing collected and negative.  Chest x-ray was performed which revealed clear lungs without acute cardiac or pulmonary abnormality and an EKG was performed which showed sinus rhythm, no acute ischemic changes.  On repeat assessment, the patient was still wheezing and feeling persistently symptomatic.  I offered admission for continued breathing treatments versus discharge home and family would be more comfortable with admission at this time given her ongoing symptoms.  Hospitalist medicine consulted for admission, Dr. Lowell Rude accepting.   Final Clinical Impression(s) / ED Diagnoses Final diagnoses:  Exacerbation of asthma, unspecified asthma severity, unspecified whether persistent    Rx / DC Orders ED Discharge Orders     None         Rosealee Concha, MD 07/19/23 1440

## 2023-07-19 NOTE — H&P (Signed)
 History and Physical    [name] ZOX:[id number] DOB: December 22, 1940 DOA: 07/19/2023 DOS: the patient was seen and examined on 07/19/2023 PCP: Georgianne Fick, MD  Patient coming from: Home  Chief Complaint:  Chief Complaint  Patient presents with   Shortness of Breath   HPI: Shirley [name] is a 83 y.o. female with medical history significant of known astham. And HTN and obesity. Patient was in her usual state of healht till about yesterday a.m. when she reports a new onset of dry cough and sensation of shortness of breath, present at rest worse with exertion.  There was no fever chest pain or rigors.  Patient is not aware of any leg swelling.  Patient did see her healthcare provider as an outpatient and was started on prednisone taper, unfortunately patient's sensation of shortness of breath progressively got worse in spite of using inhaled bronchodilators at home and had to present to [facility] today.  On arrival, the patient was afebrile, not tachycardic, mildly tachypneic RR 22, saturating 99% on room air, initially hypertensive BP 190/92 Patient on exam noted to be diffusely wheezing in all lung fields, administered DuoNebs x 3. IV access obtained the patient was administered IV magnesium and IV Solu-Medrol.   Patient is currently s/p transferred to [facility] inpatient unit.  Patient reports feeling much better.  Patient has tolerated diet.  Although patient is still having a cough and slight headache. Review of Systems: As mentioned in the history of present illness. All other systems reviewed and are negative. Past Medical History:  Diagnosis Date   BMI 40.0-44.9, adult (HCC)    CAD in native artery, stent to RCA BMS with MI 2015 11/04/2003   a. inferior STEMI 01/2014 s/p BMS to prox RCA.   DVT (deep venous thrombosis) (HCC) 2003   lower extremity DVT with questionable recurrence in 2005. Another admission 2015.   Endometrial cancer (HCC)  2021   Endometrial thickening on ultra sound 07/2009   path showing Fragments of benign endocervical and squamous mucosa. No dysplasia or malignancy // Followed by Dr. Miguel Aschoff   First degree AV block    Gastric ulcer    GERD (gastroesophageal reflux disease)    GI bleed    Hemorrhoid    Hyperlipidemia LDL goal <70    Hypertension    Insomnia 11/02/2010   Memory difficulties 11/02/2010   Mild dilation of ascending aorta (HCC)    Morbid obesity (HCC) 09/16/2012   Obstructive sleep apnea 09/23/2010   Pre-diabetes    Pulmonary nodule    ST elevation myocardial infarction (STEMI) of inferior wall (HCC) 01/24/2014   Stroke (HCC) 2015   Syncope 12/18/2013   Syncope, near 12/14/2018   TIA (transient ischemic attack) 09/23/2010   09/13/2010: CT, MRI/MRA no acute process. Carotid doppler: no stenosis. 2D-Echo Normal LV function with an EF 55-60%.    Trifascicular block    UTI (urinary tract infection) 01/27/2014   Past Surgical History:  Procedure Laterality Date   COLONOSCOPY W/ POLYPECTOMY  11/01/2007   8 mm rectal adenoma, hemorrhoids   CORONARY ANGIOPLASTY WITH STENT PLACEMENT  01/24/14   Inf. STEMI, BMS to RCA   DILATION AND CURETTAGE OF UTERUS N/A 03/28/2019   Procedure: DILATATION AND CURETTAGE OF UTERUS;  Surgeon: Carver Fila, MD;  Location: [facility];  Service: Gynecology;  Laterality: N/A;   ESOPHAGOGASTRODUODENOSCOPY N/A 10/06/2018   Procedure: ESOPHAGOGASTRODUODENOSCOPY (EGD);  Surgeon: Sherrilyn Rist, MD;  Location: [facility] ENDOSCOPY;  Service: Gastroenterology;  Laterality: N/A;   HOT HEMOSTASIS N/A 10/06/2018   Procedure: HOT HEMOSTASIS (ARGON PLASMA COAGULATION/BICAP);  Surgeon: Albertina Hugger, MD;  Location: [facility] ENDOSCOPY;  Service: Gastroenterology;  Laterality: N/A;   INTRAUTERINE DEVICE (IUD) INSERTION N/A 03/28/2019   Procedure: INTRAUTERINE DEVICE (IUD) INSERTION;  Surgeon: Suzi Essex, MD;  Location: [facility];  Service: Gynecology;  Laterality: N/A;    KNEE SURGERY  1996   Left knee arthroscopy.   LEFT HEART CATHETERIZATION WITH CORONARY ANGIOGRAM N/A 01/24/2014   Procedure: LEFT HEART CATHETERIZATION WITH CORONARY ANGIOGRAM;  Surgeon: Odie Benne, MD;  Location: [facility] CATH LAB;  Service: Cardiovascular;  Laterality: N/A;   ROBOTIC ASSISTED TOTAL HYSTERECTOMY WITH BILATERAL SALPINGO OOPHERECTOMY N/A 09/11/2019   Procedure: XI ROBOTIC ASSISTED TOTAL HYSTERECTOMY WITH BILATERAL SALPINGO OOPHORECTOMY, LYMPH NODE DISSECTION;  Surgeon: Suzi Essex, MD;  Location: [facility];  Service: Gynecology;  Laterality: N/A;   ROTATOR CUFF REPAIR     right.   SCLEROTHERAPY  10/06/2018   Procedure: SCLEROTHERAPY;  Surgeon: Albertina Hugger, MD;  Location: [facility] ENDOSCOPY;  Service: Gastroenterology;;   SENTINEL NODE BIOPSY N/A 09/11/2019   Procedure: SENTINEL NODE BIOPSY;  Surgeon: Suzi Essex, MD;  Location: [facility];  Service: Gynecology;  Laterality: N/A;   TUBAL LIGATION     Social History:  reports that she has never smoked. She has never used smokeless tobacco. She reports that she does not drink alcohol and does not use drugs.  Allergies  Allergen Reactions   Iohexol Rash and Other (See Comments)    Patient stated a rash appeared after a CT scan    Hydrochlorothiazide Other (See Comments)    12/18/21: Palpitations and SOB per the pt - AS    Family History  Problem Relation Age of Onset   Diabetes Mother    Hyperlipidemia Mother    Heart disease Mother    Dementia Mother    Angina Father    Heart attack Father    Thyroid cancer Sister    Liver cancer Sister    Heart disease Brother        x 2 in 7s yo   Prostate cancer Brother 39   Alzheimer's disease Maternal Grandmother    Cirrhosis Daughter    Stroke Neg Hx    Colon cancer Neg Hx    Esophageal cancer Neg Hx    Rectal cancer Neg Hx    Stomach cancer Neg Hx    Ovarian cancer Neg Hx    Endometrial cancer Neg Hx     Prior to Admission medications   Medication Sig  Start Date End Date Taking? Authorizing Provider  acetaminophen (TYLENOL) 500 MG tablet Take 500 mg by mouth 2 (two) times daily as needed (for pain).   Yes [provider]  albuterol (PROVENTIL) (2.5 MG/3ML) 0.083% nebulizer solution Take 3 mLs (2.5 mg total) by nebulization every 4 (four) hours as needed for wheezing or shortness of breath. 05/17/23  Yes Hunsucker, Archer Kobs, MD  albuterol (VENTOLIN HFA) 108 (90 Base) MCG/ACT inhaler INHALE 2 PUFFS INTO THE LUNGS EVERY 4 HOURS AS NEEDED FOR WHEEZING OR SHORTNESS OF BREATH. 07/28/22  Yes Hunsucker, Archer Kobs, MD  amLODipine (NORVASC) 10 MG tablet Take 10 mg by mouth daily. 03/07/21  Yes [provider]  arformoterol (BROVANA) 15 MCG/2ML NEBU Take 2 mLs (15 mcg total) by nebulization 2 (two) times daily. 05/17/23  Yes Hunsucker, Archer Kobs, MD  atorvastatin (LIPITOR)  80 MG tablet TAKE 1 TABLET BY MOUTH EVERY DAY 09/15/21  Yes Kathleene Hazel, MD  budesonide (PULMICORT) 0.5 MG/2ML nebulizer solution Take 2 mLs (0.5 mg total) by nebulization 2 (two) times daily. 05/19/23  Yes Hunsucker, Lesia Sago, MD  busPIRone (BUSPAR) 7.5 MG tablet Take 7.5 mg by mouth 2 (two) times daily. 09/21/20  Yes [provider]  cetirizine (ZYRTEC) 10 MG tablet Take 10 mg by mouth daily. 12/19/17  Yes [provider]  cholecalciferol (VITAMIN D3) 25 MCG (1000 UNIT) tablet Take 1,000 Units by mouth daily.   Yes [provider]  ELIQUIS 2.5 MG TABS tablet TAKE 1 TABLET BY MOUTH TWICE A DAY 06/20/23  Yes Malachy Mood, MD  Ferrous Gluconate 239 (27 Fe) MG TABS Take by mouth.   Yes [provider]  ferrous sulfate 325 (65 FE) MG tablet Take 325 mg by mouth daily with breakfast.   Yes [provider]  furosemide (LASIX) 40 MG tablet Take 1 tablet (40 mg total) by mouth daily. 06/17/23  Yes Kathleene Hazel, MD  ibuprofen (ADVIL) 200 MG tablet Take 200 mg by mouth every 6 (six) hours as needed for headache or moderate  pain.   Yes [provider]  metFORMIN (GLUCOPHAGE-XR) 500 MG 24 hr tablet Take 500 mg by mouth daily with breakfast.   Yes [provider]  metoprolol succinate (TOPROL XL) 50 MG 24 hr tablet Take 1 tablet (50 mg total) by mouth daily. Take with or immediately following a meal. 06/17/23  Yes Kathleene Hazel, MD  Multiple Vitamin (MULTIVITAMIN WITH MINERALS) TABS Take 1 tablet by mouth every morning.   Yes [provider]  Naphazoline HCl (CLEAR EYES OP) Place 1 drop into both eyes as needed (for irritation or dryness).    Yes [provider]  nitroGLYCERIN (NITROSTAT) 0.4 MG SL tablet Place 1 tablet (0.4 mg total) under the tongue every 5 (five) minutes x 3 doses as needed for chest pain. 10/08/22  Yes Gaston Islam., NP  omeprazole (PRILOSEC) 20 MG capsule Take 1 capsule (20 mg total) by mouth every other day. Patient taking differently: Take 20 mg by mouth daily. 08/20/20  Yes Danis, Starr Lake III, MD  potassium chloride SA (KLOR-CON M) 20 MEQ tablet Take 1 tablet (20 mEq total) by mouth daily. 06/17/23  Yes Kathleene Hazel, MD  predniSONE (DELTASONE) 10 MG tablet 4 tabs for 2 days, then 3 tabs for 2 days, 2 tabs for 2 days, then 1 tab for 2 days, then stop 07/18/23  Yes Cobb, Ruby Cola, NP  Pseudoeph-Bromphen-DM (BROMFED DM PO) Take 5 mLs by mouth 3 (three) times daily.   Yes [provider]  sodium chloride (OCEAN) 0.65 % SOLN nasal spray Place 1 spray into both nostrils as needed for congestion.   Yes [provider]  telmisartan (MICARDIS) 80 MG tablet Take 80 mg by mouth daily. 02/24/21  Yes [provider]  Spacer/Aero-Holding Chambers (AEROCHAMBER MV) inhaler Use as instructed Patient not taking: Reported on 07/19/2023 12/24/21   Hunsucker, Lesia Sago, MD    Physical Exam: Vitals:   07/19/23 1812 07/19/23 1952 07/19/23 1954 07/19/23 2005  BP: (!) 191/99 (!) 175/105 (!) 178/102   Pulse: 79 78  73  Resp:  16 19 18    Temp: 98.4 F (36.9 C) 98.4 F (36.9 C)    TempSrc: Oral Oral    SpO2: 100% 98%  98%  Weight:      Height:  General: Obese lady on room air appears to be in no distress, sister at the bedside.  Patient gives a coherent account of her symptoms Respiratory exam: Bilateral intravesicular, at this time I do not appreciate marked wheezes.  Patient is on also not in distress Cardiovascular exam S1-S2 normal Abdomen all quadrants soft nontender Extremities warm bilateral leg taut edema upto mid shin. Data Reviewed:  Labs on Admission:  Results for orders placed or performed during the hospital encounter of 07/19/23 (from the past 24 hours)  Resp panel by RT-PCR (RSV, Flu A&B, Covid) Anterior Nasal Swab     Status: None   Collection Time: 07/19/23 11:23 AM   Specimen: Anterior Nasal Swab  Result Value Ref Range   SARS Coronavirus 2 by RT PCR NEGATIVE NEGATIVE   Influenza A by PCR NEGATIVE NEGATIVE   Influenza B by PCR NEGATIVE NEGATIVE   Resp Syncytial Virus by PCR NEGATIVE NEGATIVE  Basic metabolic panel     Status: Abnormal   Collection Time: 07/19/23 11:37 AM  Result Value Ref Range   Sodium 135 135 - 145 mmol/L   Potassium 4.3 3.5 - 5.1 mmol/L   Chloride 101 98 - 111 mmol/L   CO2 26 22 - 32 mmol/L   Glucose, Bld 148 (H) 70 - 99 mg/dL   BUN 13 8 - 23 mg/dL   Creatinine, Ser 4.09 0.44 - 1.00 mg/dL   Calcium 9.6 8.9 - 81.1 mg/dL   GFR, Estimated >91 >47 mL/min   Anion gap 8 5 - 15  CBC with Differential     Status: Abnormal   Collection Time: 07/19/23 11:37 AM  Result Value Ref Range   WBC 11.6 (H) 4.0 - 10.5 K/uL   RBC 4.75 3.87 - 5.11 MIL/uL   Hemoglobin 13.8 12.0 - 15.0 g/dL   HCT 82.9 56.2 - 13.0 %   MCV 86.1 80.0 - 100.0 fL   MCH 29.1 26.0 - 34.0 pg   MCHC 33.7 30.0 - 36.0 g/dL   RDW 86.5 78.4 - 69.6 %   Platelets 208 150 - 400 K/uL   nRBC 0.0 0.0 - 0.2 %   Neutrophils Relative % 79 %   Neutro Abs 9.1 (H) 1.7 - 7.7 K/uL   Lymphocytes Relative 17 %   Lymphs  Abs 2.0 0.7 - 4.0 K/uL   Monocytes Relative 4 %   Monocytes Absolute 0.5 0.1 - 1.0 K/uL   Eosinophils Relative 0 %   Eosinophils Absolute 0.0 0.0 - 0.5 K/uL   Basophils Relative 0 %   Basophils Absolute 0.1 0.0 - 0.1 K/uL   Immature Granulocytes 0 %   Abs Immature Granulocytes 0.04 0.00 - 0.07 K/uL  Brain natriuretic peptide     Status: Abnormal   Collection Time: 07/19/23 11:37 AM  Result Value Ref Range   B Natriuretic Peptide 181.1 (H) 0.0 - 100.0 pg/mL  D-dimer, quantitative     Status: Abnormal   Collection Time: 07/19/23 11:37 AM  Result Value Ref Range   D-Dimer, Quant 0.57 (H) 0.00 - 0.50 ug/mL-FEU  Troponin I (High Sensitivity)     Status: None   Collection Time: 07/19/23 11:37 AM  Result Value Ref Range   Troponin I (High Sensitivity) 5 <18 ng/L  I-Stat venous blood gas, ([facility], [facility], [facility])     Status: Abnormal   Collection Time: 07/19/23 12:38 PM  Result Value Ref Range   pH, Ven 7.446 (H) 7.25 - 7.43   pCO2, Ven 37.1 (L) 44 -  60 mmHg   pO2, Ven 42 32 - 45 mmHg   Bicarbonate 25.5 20.0 - 28.0 mmol/L   TCO2 27 22 - 32 mmol/L   O2 Saturation 80 %   Acid-Base Excess 2.0 0.0 - 2.0 mmol/L   Sodium 136 135 - 145 mmol/L   Potassium 4.0 3.5 - 5.1 mmol/L   Calcium, Ion 1.26 1.15 - 1.40 mmol/L   HCT 38.0 36.0 - 46.0 %   Hemoglobin 12.9 12.0 - 15.0 g/dL   Patient temperature 81.1 F    Collection site IV start    Drawn by RT    Sample type VENOUS   Troponin I (High Sensitivity)     Status: None   Collection Time: 07/19/23  1:48 PM  Result Value Ref Range   Troponin I (High Sensitivity) 5 <18 ng/L   Basic Metabolic Panel: Recent Labs  Lab 07/13/23 0817 07/19/23 1137 07/19/23 1238  NA 141 135 136  K 4.0 4.3 4.0  CL 102 101  --   CO2 24 26  --   GLUCOSE 92 148*  --   BUN 9 13  --   CREATININE 0.62 0.54  --   CALCIUM 8.9 9.6  --    Liver Function Tests: No results for input(s): "AST", "ALT", "ALKPHOS", "BILITOT", "PROT", "ALBUMIN" in the last 168 hours. No  results for input(s): "LIPASE", "AMYLASE" in the last 168 hours. No results for input(s): "AMMONIA" in the last 168 hours. CBC: Recent Labs  Lab 07/19/23 1137 07/19/23 1238  WBC 11.6*  --   NEUTROABS 9.1*  --   HGB 13.8 12.9  HCT 40.9 38.0  MCV 86.1  --   PLT 208  --    Cardiac Enzymes: Recent Labs  Lab 07/19/23 1137 07/19/23 1348  TROPONINIHS 5 5    BNP (last 3 results) Recent Labs    09/22/22 1622  PROBNP 191   CBG: No results for input(s): "GLUCAP" in the last 168 hours.  Radiological Exams on Admission:  DG Chest 2 View Result Date: 07/19/2023 CLINICAL DATA:  Shortness of breath. EXAM: CHEST - 2 VIEW COMPARISON:  06/22/2023. FINDINGS: Bilateral lung fields are clear. Bilateral costophrenic angles are clear. Stable cardio-mediastinal silhouette. No acute osseous abnormalities. The soft tissues are within normal limits. IMPRESSION: *No active cardiopulmonary disease. Electronically Signed   By: Beula Brunswick M.D.   On: 07/19/2023 11:55    chest X-ray  EKG: Independently reviewed. NSR, prolonged PR. RBBB  I/O last 3 completed shifts: In: 25 [IV Piggyback:50] Out: -  No intake/output data recorded.       Assessment and Plan: * Asthma exacerbation POA s.p salumedrol and duoneb. Good clinical response. Will give dextromethorphan for cough. RT PCR cxr wnl.  Will continue treatment with Solu-Medrol 40 mg IV twice daily.  Will continue treatment with DuoNeb every 4 hourly.  Patient ordered for ICS+LABA  Leg swelling This is chronic, present on admission at this time.  I am not aware of etiology exactly. Last LVEF from  10/2022 wnl.  Patient does take chronic Lasix for this.  This is likely due to systemic fluid overload, however given prior known history of DVT bilaterally, I will get a lower extremity ultrasound to make sure were not dealing with a occult DVT.  Otherwise I will increase the patient's Lasix temporarily to 40 mg IV bid monitor intake output and  weight.  Will titrate Lasix according to response.  With plan to continue daily Lasix at home after discharge. Check  urine protein and lft. Trend bnp  History of DVT of lower extremity C/w apixaban  Memory difficulties C/w buspar  Hyperlipidemia C.w. lipitor  Hypertension Stop  Pseudoeph-Bromphen-DM . C.w. avapro and metoprolol and amlodipine. I think her BP, which is above target right now will come down with stopping adrenergeic agent as above. Prn labetlaol ordered.   Ppx - pantop    Advance Care Planning:   Code Status: Full Code   Consults: none at this time.  Family Communication: sister at bedside. All questions answered.  Severity of Illness: The appropriate patient status for this patient is INPATIENT. Inpatient status is judged to be reasonable and necessary in order to provide the required intensity of service to ensure the patient's safety. The patient's presenting symptoms, physical exam findings, and initial radiographic and laboratory data in the context of their chronic comorbidities is felt to place them at high risk for further clinical deterioration. Furthermore, it is not anticipated that the patient will be medically stable for discharge from the hospital within 2 midnights of admission.   * I certify that at the point of admission it is my clinical judgment that the patient will require inpatient hospital care spanning beyond 2 midnights from the point of admission due to high intensity of service, high risk for further deterioration and high frequency of surveillance required.*  Author: Bennie Brave, MD 07/19/2023 8:33 PM  For on call review [email or web address].

## 2023-07-19 NOTE — Assessment & Plan Note (Addendum)
 This is chronic, present on admission at this time.  I am not aware of etiology exactly. Last LVEF from  10/2022 wnl.  Patient does take chronic Lasix for this.  This is likely due to systemic fluid overload, however given prior known history of DVT bilaterally, I will get a lower extremity ultrasound to make sure were not dealing with a occult DVT.  Otherwise I will increase the patient's Lasix temporarily to 40 mg IV bid monitor intake output and weight.  Will titrate Lasix according to response.  With plan to continue daily Lasix at home after discharge. Steroids will likely make swelling worse for a while.

## 2023-07-19 NOTE — ED Notes (Signed)
 I-Stat obtained by RT.

## 2023-07-19 NOTE — Assessment & Plan Note (Addendum)
 POA s.p salumedrol and duoneb. Good clinical response. Will give dextromethorphan  for cough. RT PCR cxr wnl.  Will continue treatment with Solu-Medrol  40 mg IV twice daily.  Will continue treatment with DuoNeb every 4 hourly.  Patient ordered for ICS+LABA  Patient will be placed on a long taper at discharge.  She states that her breathing continues to improve today. She will be discharged to home on a steroid taper today in fair condition.

## 2023-07-19 NOTE — ED Notes (Addendum)
 [name] takes Q4PRN Albuterol SVN and BID Budesonide at home.  She has been taking Q4 Albuterol for the the last couple of days since her SOB has  increased.  She also has an Albuterol MDI that she is using inbetween the Q4 Albuterol treatments.

## 2023-07-19 NOTE — ED Triage Notes (Signed)
 C/o SHOB since yesterday. Audible wheezes. Recently prescribed prednisone and started yesterday. Has been using breathing treatments "nonstonp"  Hx of CHF. Reports legs swelling more than normal .

## 2023-07-19 NOTE — Assessment & Plan Note (Signed)
 C.w. lipitor

## 2023-07-19 NOTE — Assessment & Plan Note (Signed)
 Stop  Pseudoeph-Bromphen-DM . C.w. avapro and metoprolol and amlodipine. I think her BP, which is above target right now will come down with stopping adrenergeic agent as above. Prn labetlaol ordered.Pt BP is poorly controlled on current regimen of amlodipine 10 mg daily, Metoprolol succinate 50 mg daily, and irbesartan 300 mg daily. Will add hydralazine 25 mg q 6 hours.

## 2023-07-19 NOTE — Assessment & Plan Note (Signed)
 C/w buspar

## 2023-07-19 NOTE — ED Notes (Signed)
 Report given to the floor RN.

## 2023-07-19 NOTE — Assessment & Plan Note (Signed)
 C/w apixaban.

## 2023-07-20 ENCOUNTER — Observation Stay (HOSPITAL_BASED_OUTPATIENT_CLINIC_OR_DEPARTMENT_OTHER)

## 2023-07-20 DIAGNOSIS — M7989 Other specified soft tissue disorders: Secondary | ICD-10-CM

## 2023-07-20 DIAGNOSIS — I10 Essential (primary) hypertension: Secondary | ICD-10-CM | POA: Diagnosis not present

## 2023-07-20 DIAGNOSIS — J45901 Unspecified asthma with (acute) exacerbation: Secondary | ICD-10-CM | POA: Diagnosis not present

## 2023-07-20 LAB — GLUCOSE, CAPILLARY
Glucose-Capillary: 194 mg/dL — ABNORMAL HIGH (ref 70–99)
Glucose-Capillary: 257 mg/dL — ABNORMAL HIGH (ref 70–99)
Glucose-Capillary: 238 mg/dL — ABNORMAL HIGH (ref 70–99)
Glucose-Capillary: 340 mg/dL — ABNORMAL HIGH (ref 70–99)

## 2023-07-20 LAB — PROTEIN / CREATININE RATIO, URINE
Creatinine, Urine: 29 mg/dL
Total Protein, Urine: <6 mg/dL

## 2023-07-20 LAB — BASIC METABOLIC PANEL WITH GFR
Anion gap: 7 (ref 5–15)
BUN: 17 mg/dL (ref 8–23)
CO2: 23 mmol/L (ref 22–32)
Calcium: 8.9 mg/dL (ref 8.9–10.3)
Chloride: 103 mmol/L (ref 98–111)
Creatinine, Ser: 0.72 mg/dL (ref 0.44–1.00)
GFR, Estimated: >60 mL/min (ref >60)
Glucose, Bld: 212 mg/dL — ABNORMAL HIGH (ref 70–99)
Potassium: 4 mmol/L (ref 3.5–5.1)
Sodium: 133 mmol/L — ABNORMAL LOW (ref 135–145)

## 2023-07-20 LAB — HEPATIC FUNCTION PANEL
ALT: 22 U/L (ref 0–44)
AST: 20 U/L (ref 15–41)
Albumin: 3 g/dL — ABNORMAL LOW (ref 3.5–5.0)
Alkaline Phosphatase: 90 U/L (ref 38–126)
Bilirubin, Direct: 0.2 mg/dL (ref 0.0–0.2)
Indirect Bilirubin: 0.4 mg/dL (ref 0.3–0.9)
Total Bilirubin: 0.6 mg/dL (ref 0.0–1.2)
Total Protein: 6.2 g/dL — ABNORMAL LOW (ref 6.5–8.1)

## 2023-07-20 LAB — CBC
HCT: 41.1 % (ref 36.0–46.0)
Hemoglobin: 12.7 g/dL (ref 12.0–15.0)
MCH: 28.8 pg (ref 26.0–34.0)
MCHC: 30.9 g/dL (ref 30.0–36.0)
MCV: 93.2 fL (ref 80.0–100.0)
Platelets: 206 10*3/uL (ref 150–400)
RBC: 4.41 MIL/uL (ref 3.87–5.11)
RDW: 13.2 % (ref 11.5–15.5)
WBC: 12.2 10*3/uL — ABNORMAL HIGH (ref 4.0–10.5)
nRBC: 0 % (ref 0.0–0.2)

## 2023-07-20 LAB — APTT: aPTT: 28 s (ref 24–36)

## 2023-07-20 LAB — PROTIME-INR
INR: 1.2 (ref 0.8–1.2)
Prothrombin Time: 15.3 s — ABNORMAL HIGH (ref 11.4–15.2)

## 2023-07-20 LAB — BRAIN NATRIURETIC PEPTIDE: B Natriuretic Peptide: 198 pg/mL — ABNORMAL HIGH (ref 0.0–100.0)

## 2023-07-20 LAB — HEMOGLOBIN A1C
Hgb A1c MFr Bld: 7 % — ABNORMAL HIGH (ref 4.8–5.6)
Mean Plasma Glucose: 154 mg/dL

## 2023-07-20 MED ORDER — IPRATROPIUM-ALBUTEROL 0.5-2.5 (3) MG/3ML IN SOLN
3.0000 mL | Freq: Three times a day (TID) | RESPIRATORY_TRACT | Status: DC
  Administered 2023-07-20 – 2023-07-22 (×7): 3 mL via RESPIRATORY_TRACT
  Filled 2023-07-20 (×8): qty 3

## 2023-07-20 MED ORDER — ORAL CARE MOUTH RINSE: 15.0000 mL | OROMUCOSAL | Status: DC | PRN

## 2023-07-20 NOTE — Progress Notes (Signed)
 Pt BP elevated throughout PM shift. PRN labetalol X2 and hydralazine X1 administered. BP now 142/77 HR 65.

## 2023-07-20 NOTE — Care Management Obs Status (Signed)
 MEDICARE OBSERVATION STATUS NOTIFICATION   Patient Details  Name: Shirley [name] MRN: [id number] Date of Birth: [birth year]   Medicare Observation Status Notification Given:  Yes    MahabirThersia Flax, RN 07/20/2023, 2:52 PM

## 2023-07-20 NOTE — TOC Initial Note (Signed)
 Transition of Care [facility]) - Initial/Assessment Note    Patient Details  Name: Shirley [name] MRN: [id number] Date of Birth: 30-Apr-1940  [facility] CM/SW Contact:    Ruben Corolla, RN Phone Number: 07/20/2023, 2:53 PM  Clinical Narrative:   d/c plan home.                Expected Discharge Plan: Home/Self Care Barriers to Discharge: Continued Medical Work up   Patient Goals and CMS Choice            Expected Discharge Plan and Services                                              Prior Living Arrangements/Services                       Activities of Daily Living   ADL Screening (condition at time of admission) Independently performs ADLs?: Yes (appropriate for developmental age) Is the patient deaf or have difficulty hearing?: No Does the patient have difficulty seeing, even when wearing glasses/contacts?: No Does the patient have difficulty concentrating, remembering, or making decisions?: No  Permission Sought/Granted                  Emotional Assessment              Admission diagnosis:  Asthma exacerbation [J45.901] Exacerbation of asthma, unspecified asthma severity, unspecified whether persistent [J45.901] Patient Active Problem List   Diagnosis Date Noted   Asthma exacerbation 07/19/2023   Leg swelling 07/19/2023   Dysphagia 07/06/2023   Belching 07/06/2023   Abdominal pain, epigastric 07/06/2023   Diarrhea of presumed infectious origin 07/06/2023   History of DVT of lower extremity 09/04/2019   Personal history of venous thrombosis and embolism 06/04/2019   Endometrial cancer (HCC) 03/12/2019   Pre-syncope 12/14/2018   Hyponatremia 12/14/2018   Hypokalemia 12/14/2018   Leukocytosis 12/14/2018   GI bleed 10/05/2018   Asymptomatic bacteriuria 10/05/2018   History of CVA (cerebrovascular accident) 10/05/2018   Acute upper GI bleed 10/05/2018   Chest pain 07/03/2014   Dizziness 02/01/2014   Chest  wall pain 02/01/2014   UTI (urinary tract infection) 01/27/2014   ST elevation myocardial infarction (STEMI) of inferior wall (HCC) 01/24/2014   Deep vein thrombosis (DVT) (HCC) 12/24/2013   Morbid obesity (HCC) 09/16/2012   Need for tetanus, diphtheria, and acellular pertussis (Tdap) vaccine in patient of adolescent age or older 11/02/2010   Insomnia 11/02/2010   Memory difficulties 11/02/2010   TIA (transient ischemic attack) 09/23/2010   Hypertension 09/23/2010   Hyperlipidemia 09/23/2010   Hyperglycemia 09/23/2010   Obstructive sleep apnea 09/23/2010   Vaginal bleeding problems 09/23/2010   DVT (deep venous thrombosis) (HCC)    GERD (gastroesophageal reflux disease) 07/07/2010   Endometrial thickening on ultrasound 07/04/2009   CAD in native artery, stent to RCA BMS with MI 2015 11/04/2003   PCP:  Virgle Grime, MD Pharmacy:   [id number] - [place], [place] - [address] AT [facility] [address] [place] [address] Phone: [phone] Fax: [phone]  [place] [facility] [address] [place] [address] Phone: [phone] Fax: [phone]     Social Drivers of Health (SDOH) Social History: SDOH Screenings   Food Insecurity: No Food  Insecurity (07/19/2023)  Housing: Low Risk  (07/19/2023)  Transportation Needs: No Transportation Needs (07/19/2023)  Utilities: Not At Risk (07/19/2023)  Social Connections: Moderately Integrated (07/19/2023)  Tobacco Use: Low Risk  (07/19/2023)   SDOH Interventions:     Readmission Risk Interventions     No data to display

## 2023-07-20 NOTE — Plan of Care (Signed)
   Problem: Clinical Measurements: Goal: Ability to maintain clinical measurements within normal limits will improve Outcome: Progressing Goal: Diagnostic test results will improve Outcome: Progressing Goal: Respiratory complications will improve Outcome: Progressing Goal: Cardiovascular complication will be avoided Outcome: Progressing

## 2023-07-20 NOTE — Progress Notes (Signed)
 Progress Note   [name] ZOX:[id number] DOB: Sep 21, 1940 DOA: 07/19/2023     0 DOS: the patient was seen and examined on 07/20/2023   Brief hospital course:  Shirley [name] is a 83 y.o. female with medical history significant of known astham. And HTN and obesity. Patient was in her usual state of healht till about yesterday a.m. when she reports a new onset of dry cough and sensation of shortness of breath, present at rest worse with exertion.  There was no fever chest pain or rigors.  Patient is not aware of any leg swelling.  Patient did see her healthcare provider as an outpatient and was started on prednisone taper, unfortunately patient's sensation of shortness of breath progressively got worse in spite of using inhaled bronchodilators at home and had to present to [facility] today.   On arrival, the patient was afebrile, not tachycardic, mildly tachypneic RR 22, saturating 99% on room air, initially hypertensive BP 190/92 Patient on exam noted to be diffusely wheezing in all lung fields, administered DuoNebs x 3. IV access obtained the patient was administered IV magnesium and IV Solu-Medrol.    Patient is currently s/p transferred to [facility] inpatient unit.  Patient reports feeling much better.  Patient has tolerated diet.  Although patient is still having a cough and slight headache.  The patient was admitted to a progressive bed. She is receiving mucinex, BReo-ellipta, lasix, duonebs, and methylprednisolone.  She states that her breathing waxes and wanes. She continues to have severe coughing jags and wheezes. Will continue steroids and nebulizer treatments for another day.  Assessment and Plan: * Asthma exacerbation POA s.p salumedrol and duoneb. Good clinical response. Will give dextromethorphan for cough. RT PCR cxr wnl.  Will continue treatment with Solu-Medrol 40 mg IV twice daily.  Will continue treatment with DuoNeb every 4 hourly.   Patient ordered for ICS+LABA  Leg swelling This is chronic, present on admission at this time.  I am not aware of etiology exactly. Last LVEF from  10/2022 wnl.  Patient does take chronic Lasix for this.  This is likely due to systemic fluid overload, however given prior known history of DVT bilaterally, I will get a lower extremity ultrasound to make sure were not dealing with a occult DVT.  Otherwise I will increase the patient's Lasix temporarily to 40 mg IV bid monitor intake output and weight.  Will titrate Lasix according to response.  With plan to continue daily Lasix at home after discharge. Check urine protein and lft. Trend bnp  History of DVT of lower extremity C/w apixaban  Memory difficulties C/w buspar  Hyperlipidemia C.w. lipitor  Hypertension Stop  Pseudoeph-Bromphen-DM . C.w. avapro and metoprolol and amlodipine. I think her BP, which is above target right now will come down with stopping adrenergeic agent as above. Prn labetlaol ordered.      Subjective: The patient is resting in bed. She states that she is feeling a little better, but still with concerning dyspnea at times. Some conversational dyspnea.  Physical Exam: Vitals:   07/20/23 0757 07/20/23 0923 07/20/23 1318 07/20/23 1450  BP:  (!) 153/74 (!) 171/76 (!) 134/53  Pulse:  73  74  Resp:  16 16   Temp:  98.2 F (36.8 C) 97.8 F (36.6 C)   TempSrc:      SpO2: 94% 98% 97%   Weight:      Height:  Exam:  Constitutional:  The patient is awake, alert, and oriented x 3. No acute distress. Respiratory:  Dyspnea with conversation Poor air entry, diminished lung sounds throughout No wheezes, rales, or rhonchi No tactile fremitus Cardiovascular:  Regular rate and rhythm No murmurs, ectopy, or gallups. No lateral PMI. No thrills. Abdomen:  Abdomen is soft, non-tender, non-distended No hernias, masses, or organomegaly Normoactive bowel sounds.  Musculoskeletal:  No cyanosis, clubbing, or  edema Skin:  No rashes, lesions, ulcers palpation of skin: no induration or nodules Neurologic:  CN 2-12 intact Sensation all 4 extremities intact Psychiatric:  Mental status Mood, affect appropriate Orientation to person, place, time  judgment and insight appear intact  Data Reviewed:  CBC BMP  Family Communication: Family at bedside  Disposition: Status is: Observation The patient remains OBS appropriate and will d/c before 2 midnights.  Planned Discharge Destination: Home    Time spent: 34 minutes  Author: Rael Tilly, DO 07/20/2023 5:17 PM  For on call review [email or web address].

## 2023-07-21 DIAGNOSIS — I10 Essential (primary) hypertension: Secondary | ICD-10-CM | POA: Diagnosis not present

## 2023-07-21 DIAGNOSIS — J45901 Unspecified asthma with (acute) exacerbation: Secondary | ICD-10-CM | POA: Diagnosis not present

## 2023-07-21 DIAGNOSIS — M7989 Other specified soft tissue disorders: Secondary | ICD-10-CM | POA: Diagnosis not present

## 2023-07-21 LAB — CBC WITH DIFFERENTIAL/PLATELET
Abs Immature Granulocytes: 0.09 10*3/uL — ABNORMAL HIGH (ref 0.00–0.07)
Basophils Absolute: 0 10*3/uL (ref 0.0–0.1)
Basophils Relative: 0 %
Eosinophils Absolute: 0 10*3/uL (ref 0.0–0.5)
Eosinophils Relative: 0 %
HCT: 39.4 % (ref 36.0–46.0)
Hemoglobin: 12.6 g/dL (ref 12.0–15.0)
Immature Granulocytes: 1 %
Lymphocytes Relative: 13 %
Lymphs Abs: 1.7 10*3/uL (ref 0.7–4.0)
MCH: 28.8 pg (ref 26.0–34.0)
MCHC: 32 g/dL (ref 30.0–36.0)
MCV: 90.2 fL (ref 80.0–100.0)
Monocytes Absolute: 0.7 10*3/uL (ref 0.1–1.0)
Monocytes Relative: 5 %
Neutro Abs: 10.2 10*3/uL — ABNORMAL HIGH (ref 1.7–7.7)
Neutrophils Relative %: 81 %
Platelets: 203 10*3/uL (ref 150–400)
RBC: 4.37 MIL/uL (ref 3.87–5.11)
RDW: 13.2 % (ref 11.5–15.5)
WBC: 12.7 10*3/uL — ABNORMAL HIGH (ref 4.0–10.5)
nRBC: 0 % (ref 0.0–0.2)

## 2023-07-21 LAB — BASIC METABOLIC PANEL WITH GFR
Anion gap: 8 (ref 5–15)
BUN: 22 mg/dL (ref 8–23)
CO2: 24 mmol/L (ref 22–32)
Calcium: 8.6 mg/dL — ABNORMAL LOW (ref 8.9–10.3)
Chloride: 102 mmol/L (ref 98–111)
Creatinine, Ser: 0.66 mg/dL (ref 0.44–1.00)
GFR, Estimated: >60 mL/min (ref >60)
Glucose, Bld: 245 mg/dL — ABNORMAL HIGH (ref 70–99)
Potassium: 3.7 mmol/L (ref 3.5–5.1)
Sodium: 134 mmol/L — ABNORMAL LOW (ref 135–145)

## 2023-07-21 LAB — URINALYSIS, ROUTINE W REFLEX MICROSCOPIC
Bilirubin Urine: NEGATIVE
Glucose, UA: 50 mg/dL — AB
Hgb urine dipstick: NEGATIVE
Ketones, ur: NEGATIVE mg/dL
Nitrite: NEGATIVE
Protein, ur: NEGATIVE mg/dL
Specific Gravity, Urine: 1.018 (ref 1.005–1.030)
pH: 7 (ref 5.0–8.0)

## 2023-07-21 LAB — GLUCOSE, CAPILLARY
Glucose-Capillary: 216 mg/dL — ABNORMAL HIGH (ref 70–99)
Glucose-Capillary: 169 mg/dL — ABNORMAL HIGH (ref 70–99)
Glucose-Capillary: 166 mg/dL — ABNORMAL HIGH (ref 70–99)
Glucose-Capillary: 265 mg/dL — ABNORMAL HIGH (ref 70–99)

## 2023-07-21 MED ORDER — SODIUM CHLORIDE 0.9 % IV SOLN
1.0000 g | INTRAVENOUS | Status: DC
  Administered 2023-07-21: 1 g via INTRAVENOUS
  Filled 2023-07-21 (×2): qty 10

## 2023-07-21 MED ORDER — HYDRALAZINE HCL 25 MG PO TABS
25.0000 mg | ORAL_TABLET | Freq: Four times a day (QID) | ORAL | Status: DC
  Administered 2023-07-21 – 2023-07-22 (×4): 25 mg via ORAL
  Filled 2023-07-21 (×4): qty 1

## 2023-07-21 NOTE — Progress Notes (Signed)
   07/21/23 1942  BiPAP/CPAP/SIPAP  BiPAP/CPAP/SIPAP Pt Type Adult  Reason BIPAP/CPAP not in use Other(comment) (Pt stated she would call if she decided to wear cpap tonight.)

## 2023-07-21 NOTE — Progress Notes (Signed)
 Mobility Specialist - Progress Note   07/21/23 1114  Mobility  Activity Ambulated with assistance in hallway  Level of Assistance Modified independent, requires aide device or extra time  Assistive Device Front wheel walker  Distance Ambulated (ft) 460 ft  Activity Response Tolerated well  Mobility Referral Yes  Mobility visit 1 Mobility  Mobility Specialist Start Time (ACUTE ONLY) 1100  Mobility Specialist Stop Time (ACUTE ONLY) 1113  Mobility Specialist Time Calculation (min) (ACUTE ONLY) 13 min   Pt received in recliner and agreeable to mobility. Pt took x1 standing rest break during session. No complaints during session. Pt to recliner after session with all needs met.   [facility]

## 2023-07-21 NOTE — Progress Notes (Signed)
 Progress Note   [name] ZOX:[id number] DOB: 23-Jan-1941 DOA: 07/19/2023     0 DOS: the patient was seen and examined on 07/21/2023   Brief hospital course:  Pauleen Goleman is a 83 y.o. female with medical history significant of known astham. And HTN and obesity. Patient was in her usual state of healht till about yesterday a.m. when she reports a new onset of dry cough and sensation of shortness of breath, present at rest worse with exertion.  There was no fever chest pain or rigors.  Patient is not aware of any leg swelling.  Patient did see her healthcare provider as an outpatient and was started on prednisone taper, unfortunately patient's sensation of shortness of breath progressively got worse in spite of using inhaled bronchodilators at home and had to present to [facility] today.   On arrival, the patient was afebrile, not tachycardic, mildly tachypneic RR 22, saturating 99% on room air, initially hypertensive BP 190/92 Patient on exam noted to be diffusely wheezing in all lung fields, administered DuoNebs x 3. IV access obtained the patient was administered IV magnesium and IV Solu-Medrol.    Patient is currently s/p transferred to [facility] inpatient unit.  Patient reports feeling much better.  Patient has tolerated diet.  Although patient is still having a cough and slight headache.  The patient was admitted to a progressive bed. She is receiving mucinex, BReo-ellipta, lasix, duonebs, and methylprednisolone.  She states that her breathing is a little better today. Still with some coughing, although wheezing is better. She feels as though she is going to get worse again.   Assessment and Plan: * Asthma exacerbation POA s.p salumedrol and duoneb. Good clinical response. Will give dextromethorphan for cough. RT PCR cxr wnl.  Will continue treatment with Solu-Medrol 40 mg IV twice daily.  Will continue treatment with DuoNeb every 4 hourly.   Patient ordered for ICS+LABA  Patient will be placed on a long taper at discharge.  She is doing better, but will keep another night as the patient feels that she often gets worse at this point in her care.  Leg swelling This is chronic, present on admission at this time.  I am not aware of etiology exactly. Last LVEF from  10/2022 wnl.  Patient does take chronic Lasix for this.  This is likely due to systemic fluid overload, however given prior known history of DVT bilaterally, I will get a lower extremity ultrasound to make sure were not dealing with a occult DVT.  Otherwise I will increase the patient's Lasix temporarily to 40 mg IV bid monitor intake output and weight.  Will titrate Lasix according to response.  With plan to continue daily Lasix at home after discharge. Steroids will likely make swelling worse for a while.  History of DVT of lower extremity C/w apixaban  Memory difficulties C/w buspar  Hyperlipidemia C.w. lipitor  Hypertension Stop  Pseudoeph-Bromphen-DM . C.w. avapro and metoprolol and amlodipine. I think her BP, which is above target right now will come down with stopping adrenergeic agent as above. Prn labetlaol ordered.Pt BP is poorly controlled on current regimen of amlodipine 10 mg daily, Metoprolol succinate 50 mg daily, and irbesartan 300 mg daily. Will add hydralazine 25 mg q 6 hours.      Subjective: The patient is sitting up at bedside. She states that she is feeling better, but she is very nervous about going home.  Physical Exam: Vitals:  07/21/23 0736 07/21/23 1047 07/21/23 1051 07/21/23 1224  BP:  (!) 154/70  (!) 157/70  Pulse:  (!) 54 60 60  Resp:  16  19  Temp:  98.1 F (36.7 C)  (!) 97.4 F (36.3 C)  TempSrc:  Oral  Oral  SpO2: 96% 96%  99%  Weight:      Height:       Exam:  Constitutional:  The patient is awake, alert, and oriented x 3. No acute distress. Respiratory:  Dyspnea with conversation Diminished breath sounds, but  improved air entry No wheezes, rales, or rhonchi No tactile fremitus Cardiovascular:  Regular rate and rhythm No murmurs, ectopy, or gallups. No lateral PMI. No thrills. Abdomen:  Abdomen is soft, non-tender, non-distended No hernias, masses, or organomegaly Normoactive bowel sounds.  Musculoskeletal:  No cyanosis, clubbing, or edema Skin:  No rashes, lesions, ulcers palpation of skin: no induration or nodules Neurologic:  CN 2-12 intact Sensation all 4 extremities intact Psychiatric:  Mental status Mood, affect appropriate Orientation to person, place, time  judgment and insight appear intact  Data Reviewed:  CBC BMP  Family Communication: Family at bedside  Disposition: Status is: Observation The patient remains OBS appropriate and will d/c before 2 midnights.  Planned Discharge Destination: Home    Time spent: 38 minutes  Author: Tallis Soledad, DO 07/21/2023 4:42 PM  For on call review [email or web address].

## 2023-07-21 NOTE — Plan of Care (Signed)
   Problem: Clinical Measurements: Goal: Ability to maintain clinical measurements within normal limits will improve Outcome: Progressing Goal: Diagnostic test results will improve Outcome: Progressing Goal: Respiratory complications will improve Outcome: Progressing Goal: Cardiovascular complication will be avoided Outcome: Progressing

## 2023-07-22 DIAGNOSIS — N3000 Acute cystitis without hematuria: Secondary | ICD-10-CM

## 2023-07-22 DIAGNOSIS — M7989 Other specified soft tissue disorders: Secondary | ICD-10-CM | POA: Diagnosis not present

## 2023-07-22 DIAGNOSIS — J45901 Unspecified asthma with (acute) exacerbation: Secondary | ICD-10-CM | POA: Diagnosis not present

## 2023-07-22 DIAGNOSIS — I10 Essential (primary) hypertension: Secondary | ICD-10-CM | POA: Diagnosis not present

## 2023-07-22 LAB — BASIC METABOLIC PANEL WITH GFR
Anion gap: 11 (ref 5–15)
BUN: 24 mg/dL — ABNORMAL HIGH (ref 8–23)
CO2: 23 mmol/L (ref 22–32)
Calcium: 8.3 mg/dL — ABNORMAL LOW (ref 8.9–10.3)
Chloride: 97 mmol/L — ABNORMAL LOW (ref 98–111)
Creatinine, Ser: 0.49 mg/dL (ref 0.44–1.00)
GFR, Estimated: >60 mL/min (ref >60)
Glucose, Bld: 243 mg/dL — ABNORMAL HIGH (ref 70–99)
Potassium: 3.5 mmol/L (ref 3.5–5.1)
Sodium: 131 mmol/L — ABNORMAL LOW (ref 135–145)

## 2023-07-22 LAB — CBC WITH DIFFERENTIAL/PLATELET
Abs Immature Granulocytes: 0.23 10*3/uL — ABNORMAL HIGH (ref 0.00–0.07)
Basophils Absolute: 0 10*3/uL (ref 0.0–0.1)
Basophils Relative: 0 %
Eosinophils Absolute: 0 10*3/uL (ref 0.0–0.5)
Eosinophils Relative: 0 %
HCT: 41 % (ref 36.0–46.0)
Hemoglobin: 13.3 g/dL (ref 12.0–15.0)
Immature Granulocytes: 2 %
Lymphocytes Relative: 15 %
Lymphs Abs: 1.8 10*3/uL (ref 0.7–4.0)
MCH: 28.7 pg (ref 26.0–34.0)
MCHC: 32.4 g/dL (ref 30.0–36.0)
MCV: 88.4 fL (ref 80.0–100.0)
Monocytes Absolute: 0.6 10*3/uL (ref 0.1–1.0)
Monocytes Relative: 5 %
Neutro Abs: 9.4 10*3/uL — ABNORMAL HIGH (ref 1.7–7.7)
Neutrophils Relative %: 78 %
Platelets: 235 10*3/uL (ref 150–400)
RBC: 4.64 MIL/uL (ref 3.87–5.11)
RDW: 13.2 % (ref 11.5–15.5)
WBC: 12 10*3/uL — ABNORMAL HIGH (ref 4.0–10.5)
nRBC: 0 % (ref 0.0–0.2)

## 2023-07-22 LAB — GLUCOSE, CAPILLARY
Glucose-Capillary: 208 mg/dL — ABNORMAL HIGH (ref 70–99)
Glucose-Capillary: 201 mg/dL — ABNORMAL HIGH (ref 70–99)

## 2023-07-22 MED ORDER — HYDRALAZINE HCL 25 MG PO TABS: 25.0000 mg | ORAL_TABLET | Freq: Four times a day (QID) | ORAL | 0 refills | Status: AC

## 2023-07-22 MED ORDER — AMOXICILLIN-POT CLAVULANATE 875-125 MG PO TABS: 1.0000 | ORAL_TABLET | Freq: Two times a day (BID) | ORAL | 0 refills | Status: AC

## 2023-07-22 MED ORDER — FLUTICASONE FUROATE-VILANTEROL 200-25 MCG/ACT IN AEPB: 1.0000 | INHALATION_SPRAY | Freq: Every day | RESPIRATORY_TRACT | 0 refills | Status: DC

## 2023-07-22 MED ORDER — PREDNISONE 20 MG PO TABS: ORAL_TABLET | ORAL | 0 refills | Status: AC

## 2023-07-22 NOTE — Progress Notes (Signed)
 Mobility Specialist - Progress Note   07/22/23 1225  Mobility  Activity Ambulated with assistance in hallway  Level of Assistance Standby assist, set-up cues, supervision of patient - no hands on  Assistive Device Front wheel walker  Distance Ambulated (ft) 300 ft  Range of Motion/Exercises Active  Activity Response Tolerated well  Mobility Referral Yes  Mobility visit 1 Mobility  Mobility Specialist Start Time (ACUTE ONLY) 1215  Mobility Specialist Stop Time (ACUTE ONLY) 1225  Mobility Specialist Time Calculation (min) (ACUTE ONLY) 10 min   Pt was found in bed and agreeable to ambulate. Harder work of breathing with progression of session. At EOS returned to use bathroom. Family in room.  Erminio Leos Mobility Specialist

## 2023-07-22 NOTE — TOC Transition Note (Signed)
 Transition of Care [facility]) - Discharge Note   Patient Details  Name: Shirley [name] MRN: [id number] Date of Birth: 06/29/1940  [facility] CM/SW Contact:  Bascom Service, RN Phone Number: 07/22/2023, 12:20 PM   Clinical Narrative:d/c home No CM needs.       Final next level of care: Home/Self Care Barriers to Discharge: No Barriers Identified   Patient Goals and CMS Choice            Discharge Placement                       Discharge Plan and Services Additional resources added to the After Visit Summary for                                       Social Drivers of Health (SDOH) Interventions SDOH Screenings   Food Insecurity: No Food Insecurity (07/19/2023)  Housing: Low Risk  (07/19/2023)  Transportation Needs: No Transportation Needs (07/19/2023)  Utilities: Not At Risk (07/19/2023)  Social Connections: Moderately Integrated (07/19/2023)  Tobacco Use: Low Risk  (07/19/2023)     Readmission Risk Interventions     No data to display

## 2023-07-22 NOTE — Discharge Summary (Signed)
 Physician Discharge Summary   [name] MRN: [id number] DOB: 19-Jun-1940  Admit date:     07/19/2023  Discharge date: 07/22/23  Discharge Physician: Brigida Bureau   PCP: Verdia Lombard, MD   Recommendations at discharge:    Discharge to home Follow up with PCP in 7-10 days Keep appointments with pulmonology and cardiology as previously scheduled.  Discharge Diagnoses: Principal Problem:   Asthma exacerbation Active Problems:   Hypertension   UTI (urinary tract infection)   Leg swelling  Resolved Problems:   * No resolved hospital problems. *  Hospital Course: Shirley [name] is a 83 y.o. female with medical history significant of known astham. And HTN and obesity. Patient was in her usual state of healht till about yesterday a.m. when she reports a new onset of dry cough and sensation of shortness of breath, present at rest worse with exertion.  There was no fever chest pain or rigors.  Patient is not aware of any leg swelling.  Patient did see her healthcare provider as an outpatient and was started on prednisone  taper, unfortunately patient's sensation of shortness of breath progressively got worse in spite of using inhaled bronchodilators at home and had to present to [facility] today.   On arrival, the patient was afebrile, not tachycardic, mildly tachypneic RR 22, saturating 99% on room air, initially hypertensive BP 190/92 Patient on exam noted to be diffusely wheezing in all lung fields, administered DuoNebs x 3. IV access obtained the patient was administered IV magnesium  and IV Solu-Medrol .    Patient is currently s/p transferred to [facility] inpatient unit.  Patient reports feeling much better.  Patient has tolerated diet.  Although patient is still having a cough and slight headache.   The patient was admitted to a progressive bed. She is receiving mucinex , BReo-ellipta, lasix , duonebs, and methylprednisolone .   She states  that her breathing is continues to improve. She will be discharged to home today on a steroid taper.  Assessment and Plan: * Asthma exacerbation POA s.p salumedrol and duoneb. Good clinical response. Will give dextromethorphan  for cough. RT PCR cxr wnl.  Will continue treatment with Solu-Medrol  40 mg IV twice daily.  Will continue treatment with DuoNeb every 4 hourly.  Patient ordered for ICS+LABA  Patient will be placed on a long taper at discharge.  She states that her breathing continues to improve today. She will be discharged to home on a steroid taper today in fair condition.  Leg swelling This is chronic, present on admission at this time.  I am not aware of etiology exactly. Last LVEF from  10/2022 wnl.  Patient does take chronic Lasix  for this.  This is likely due to systemic fluid overload, however given prior known history of DVT bilaterally, I will get a lower extremity ultrasound to make sure were not dealing with a occult DVT.  Otherwise I will increase the patient's Lasix  temporarily to 40 mg IV bid monitor intake output and weight.  Will titrate Lasix  according to response.  With plan to continue daily Lasix  at home after discharge. Steroids will likely make swelling worse for a while.  History of DVT of lower extremity C/w apixaban   UTI (urinary tract infection) Treated with IV Rocephin .  Memory difficulties C/w buspar   Hyperlipidemia C.w. lipitor   Hypertension Stop  Pseudoeph-Bromphen-DM . C.w. avapro  and metoprolol  and amlodipine . I think her BP, which is above target right now will come down with stopping adrenergeic  agent as above. Prn labetlaol ordered.Pt BP is poorly controlled on current regimen of amlodipine  10 mg daily, Metoprolol  succinate 50 mg daily, and irbesartan  300 mg daily. Will add hydralazine  25 mg q 6 hours.         Consultants: None Procedures performed: None  Disposition: Home Diet recommendation:  Cardiac diet DISCHARGE  MEDICATION: Allergies as of 07/22/2023       Reactions   Iohexol  Rash, Other (See Comments)   Patient stated a rash appeared after a CT scan    Hydrochlorothiazide  Other (See Comments)   12/18/21: Palpitations and SOB per the pt - AS        Medication List     STOP taking these medications    Ferrous Gluconate 239 (27 Fe) MG Tabs       TAKE these medications    acetaminophen  500 MG tablet Commonly known as: TYLENOL  Take 500 mg by mouth 2 (two) times daily as needed (for pain).   AeroChamber MV inhaler Use as instructed   albuterol  108 (90 Base) MCG/ACT inhaler Commonly known as: VENTOLIN  HFA INHALE 2 PUFFS INTO THE LUNGS EVERY 4 HOURS AS NEEDED FOR WHEEZING OR SHORTNESS OF BREATH.   albuterol  (2.5 MG/3ML) 0.083% nebulizer solution Commonly known as: PROVENTIL  Take 3 mLs (2.5 mg total) by nebulization every 4 (four) hours as needed for wheezing or shortness of breath.   amLODipine  10 MG tablet Commonly known as: NORVASC  Take 10 mg by mouth daily.   amoxicillin -clavulanate 875-125 MG tablet Commonly known as: AUGMENTIN  Take 1 tablet by mouth 2 (two) times daily for 5 days.   arformoterol  15 MCG/2ML Nebu Commonly known as: BROVANA  Take 2 mLs (15 mcg total) by nebulization 2 (two) times daily.   atorvastatin  80 MG tablet Commonly known as: LIPITOR  TAKE 1 TABLET BY MOUTH EVERY DAY   BROMFED DM PO Take 5 mLs by mouth 3 (three) times daily.   budesonide  0.5 MG/2ML nebulizer solution Commonly known as: PULMICORT  Take 2 mLs (0.5 mg total) by nebulization 2 (two) times daily.   busPIRone  7.5 MG tablet Commonly known as: BUSPAR  Take 7.5 mg by mouth 2 (two) times daily.   cetirizine 10 MG tablet Commonly known as: ZYRTEC Take 10 mg by mouth daily.   cholecalciferol 25 MCG (1000 UNIT) tablet Commonly known as: VITAMIN D3 Take 1,000 Units by mouth daily.   CLEAR EYES OP Place 1 drop into both eyes as needed (for irritation or dryness).   Eliquis  2.5 MG  Tabs tablet Generic drug: apixaban  TAKE 1 TABLET BY MOUTH TWICE A DAY   ferrous sulfate  325 (65 FE) MG tablet Take 325 mg by mouth daily with breakfast.   fluticasone  furoate-vilanterol 200-25 MCG/ACT Aepb Commonly known as: BREO ELLIPTA  Inhale 1 puff into the lungs daily. Start taking on: July 23, 2023   furosemide  40 MG tablet Commonly known as: LASIX  Take 1 tablet (40 mg total) by mouth daily.   hydrALAZINE  25 MG tablet Commonly known as: APRESOLINE  Take 1 tablet (25 mg total) by mouth every 6 (six) hours.   ibuprofen 200 MG tablet Commonly known as: ADVIL Take 200 mg by mouth every 6 (six) hours as needed for headache or moderate pain.   metFORMIN 500 MG 24 hr tablet Commonly known as: GLUCOPHAGE-XR Take 500 mg by mouth daily with breakfast.   metoprolol  succinate 50 MG 24 hr tablet Commonly known as: Toprol  XL Take 1 tablet (50 mg total) by mouth daily. Take with or immediately following a meal.  multivitamin with minerals Tabs tablet Take 1 tablet by mouth every morning.   nitroGLYCERIN  0.4 MG SL tablet Commonly known as: NITROSTAT  Place 1 tablet (0.4 mg total) under the tongue every 5 (five) minutes x 3 doses as needed for chest pain.   omeprazole  20 MG capsule Commonly known as: PRILOSEC Take 1 capsule (20 mg total) by mouth every other day. What changed: when to take this   potassium chloride  SA 20 MEQ tablet Commonly known as: KLOR-CON  M Take 1 tablet (20 mEq total) by mouth daily.   predniSONE  20 MG tablet Commonly known as: DELTASONE  Take 2 tablets (40 mg total) by mouth daily with breakfast for 3 days, THEN 1.5 tablets (30 mg total) daily with breakfast for 3 days, THEN 1 tablet (20 mg total) daily with breakfast for 3 days, THEN 0.5 tablets (10 mg total) daily with breakfast for 3 days. Start taking on: July 22, 2023 What changed:  medication strength See the new instructions.   sodium chloride  0.65 % Soln nasal spray Commonly known as:  OCEAN Place 1 spray into both nostrils as needed for congestion.   telmisartan 80 MG tablet Commonly known as: MICARDIS Take 80 mg by mouth daily.        Discharge Exam: Filed Weights   07/20/23 0500 07/21/23 0500 07/22/23 0500  Weight: 108.8 kg 108.9 kg 107.2 kg   Exam:  Constitutional:  The patient is awake, alert, and oriented x 3. No acute distress. Respiratory:  No increased work of breathing. No wheezes, rales, or rhonchi No tactile fremitus Cardiovascular:  Regular rate and rhythm No murmurs, ectopy, or gallups. No lateral PMI. No thrills. Abdomen:  Abdomen is soft, non-tender, non-distended No hernias, masses, or organomegaly Normoactive bowel sounds.  Musculoskeletal:  No cyanosis, clubbing, or edema Skin:  No rashes, lesions, ulcers palpation of skin: no induration or nodules Neurologic:  CN 2-12 intact Sensation all 4 extremities intact Psychiatric:  Mental status Mood, affect appropriate Orientation to person, place, time  judgment and insight appear intact   Condition at discharge: fair  The results of significant diagnostics from this hospitalization (including imaging, microbiology, ancillary and laboratory) are listed below for reference.   Imaging Studies: VAS US  LOWER EXTREMITY VENOUS (DVT) Result Date: 07/20/2023  Lower Venous DVT Study Patient Name:  BRIONY PARVEEN DE PAULA  Date of Exam:   07/20/2023 Medical Rec #: [id number]              Accession #:    [id number] Date of Birth: 04/04/1941              Patient Gender: F Patient Age:   46 years Exam Location:  [facility] Procedure:      VAS US  LOWER EXTREMITY VENOUS (DVT) Referring Phys: [facility] GOEL --------------------------------------------------------------------------------  Indications: Swelling, Edema, SOB, and stroke. Other Indications: H/O DVT, CHF. Comparison Study: Previous study on 12.29.2015 Performing Technologist: Edilia Elden Appl  Examination Guidelines: A complete  evaluation includes B-mode imaging, spectral Doppler, color Doppler, and power Doppler as needed of all accessible portions of each vessel. Bilateral testing is considered an integral part of a complete examination. Limited examinations for reoccurring indications may be performed as noted. The reflux portion of the exam is performed with the patient in reverse Trendelenburg.  +---------+---------------+---------+-----------+----------+-------------------+ RIGHT    CompressibilityPhasicitySpontaneityPropertiesThrombus Aging      +---------+---------------+---------+-----------+----------+-------------------+ CFV      Full           Yes  Yes                                      +---------+---------------+---------+-----------+----------+-------------------+ SFJ      Full           Yes      Yes                                      +---------+---------------+---------+-----------+----------+-------------------+ FV Prox  Full                                                             +---------+---------------+---------+-----------+----------+-------------------+ FV Mid   Full                                                             +---------+---------------+---------+-----------+----------+-------------------+ FV DistalFull                                                             +---------+---------------+---------+-----------+----------+-------------------+ PFV      Full                                                             +---------+---------------+---------+-----------+----------+-------------------+ POP      Full           Yes      Yes                                      +---------+---------------+---------+-----------+----------+-------------------+ PTV                                                   Not well visualized +---------+---------------+---------+-----------+----------+-------------------+ PERO                                                   Not well visualized +---------+---------------+---------+-----------+----------+-------------------+   +---------+---------------+---------+-----------+----------+-------------------+ LEFT     CompressibilityPhasicitySpontaneityPropertiesThrombus Aging      +---------+---------------+---------+-----------+----------+-------------------+ CFV      Full           Yes      Yes                                      +---------+---------------+---------+-----------+----------+-------------------+  SFJ      Full           Yes      Yes                                      +---------+---------------+---------+-----------+----------+-------------------+ FV Prox  Full                                                             +---------+---------------+---------+-----------+----------+-------------------+ FV Mid   Full           Yes      Yes                                      +---------+---------------+---------+-----------+----------+-------------------+ FV DistalFull           Yes      Yes                                      +---------+---------------+---------+-----------+----------+-------------------+ PFV      Full                                                             +---------+---------------+---------+-----------+----------+-------------------+ POP      Full           Yes      Yes                                      +---------+---------------+---------+-----------+----------+-------------------+ PTV                                                   Not well visualized +---------+---------------+---------+-----------+----------+-------------------+ PERO                                                  Not well visualized +---------+---------------+---------+-----------+----------+-------------------+     Summary: RIGHT: - There is no evidence of deep vein thrombosis in the lower extremity. However, portions of  this examination were limited- see technologist comments above.  - No cystic structure found in the popliteal fossa.  LEFT: - There is no evidence of deep vein thrombosis in the lower extremity. However, portions of this examination were limited- see technologist comments above.  - No cystic structure found in the popliteal fossa.  *See table(s) above for measurements and observations. Electronically signed by Fonda Rim on 07/20/2023 at 8:05:02 PM.    Final    DG Chest 2 View Result Date: 07/19/2023 CLINICAL DATA:  Shortness of breath. EXAM: CHEST -  2 VIEW COMPARISON:  06/22/2023. FINDINGS: Bilateral lung fields are clear. Bilateral costophrenic angles are clear. Stable cardio-mediastinal silhouette. No acute osseous abnormalities. The soft tissues are within normal limits. IMPRESSION: *No active cardiopulmonary disease. Electronically Signed   By: Ree Molt M.D.   On: 07/19/2023 11:55   DG UGI W DOUBLE CM (HD BA) Result Date: 07/15/2023 CLINICAL DATA:  Dysphagia, bulging, epigastric pain, globus sensation. EXAM: UPPER GI SERIES WITH KUB TECHNIQUE: After obtaining a scout radiograph a routine upper GI series was performed using thin and high density barium. FLUOROSCOPY: Radiation Exposure Index (as provided by the fluoroscopic device): 24.1 mGy Kerma COMPARISON:  None Available. FINDINGS: Scout view of the abdomen shows moderate stool burden. No small bowel dilatation. No unexpected radiopaque calculi. Degenerative changes in the spine. Scarring in the left lung base. Double contrast examination of the upper gastrointestinal tract shows occasionally sluggish esophageal motility. No esophageal fold thickening, stricture or obstruction. Tiny hiatal hernia. Stomach and duodenal bulb are unremarkable. IMPRESSION: 1. Occasionally sluggish esophageal motility. 2. Tiny hiatal hernia. Electronically Signed   By: Newell Eke M.D.   On: 07/15/2023 09:21    Microbiology: Results for orders placed or  performed during the hospital encounter of 07/19/23  Resp panel by RT-PCR (RSV, Flu A&B, Covid) Anterior Nasal Swab     Status: None   Collection Time: 07/19/23 11:23 AM   Specimen: Anterior Nasal Swab  Result Value Ref Range Status   SARS Coronavirus 2 by RT PCR NEGATIVE NEGATIVE Final    Comment: (NOTE) SARS-CoV-2 target nucleic acids are NOT DETECTED.  The SARS-CoV-2 RNA is generally detectable in upper respiratory specimens during the acute phase of infection. The lowest concentration of SARS-CoV-2 viral copies this assay can detect is 138 copies/mL. A negative result does not preclude SARS-Cov-2 infection and should not be used as the sole basis for treatment or other patient management decisions. A negative result may occur with  improper specimen collection/handling, submission of specimen other than nasopharyngeal swab, presence of viral mutation(s) within the areas targeted by this assay, and inadequate number of viral copies(<138 copies/mL). A negative result must be combined with clinical observations, patient history, and epidemiological information. The expected result is Negative.  Fact Sheet for Patients:  bloggercourse.com  Fact Sheet for Healthcare Providers:  seriousbroker.it  This test is no t yet approved or cleared by the [place]  FDA and  has been authorized for detection and/or diagnosis of SARS-CoV-2 by FDA under an Emergency Use Authorization (EUA). This EUA will remain  in effect (meaning this test can be used) for the duration of the COVID-19 declaration under Section 564(b)(1) of the Act, 21 U.S.C.section 360bbb-3(b)(1), unless the authorization is terminated  or revoked sooner.       Influenza A by PCR NEGATIVE NEGATIVE Final   Influenza B by PCR NEGATIVE NEGATIVE Final    Comment: (NOTE) The Xpert Xpress SARS-CoV-2/FLU/RSV plus assay is intended as an aid in the diagnosis of influenza from  Nasopharyngeal swab specimens and should not be used as a sole basis for treatment. Nasal washings and aspirates are unacceptable for Xpert Xpress SARS-CoV-2/FLU/RSV testing.  Fact Sheet for Patients: bloggercourse.com  Fact Sheet for Healthcare Providers: seriousbroker.it  This test is not yet approved or cleared by the [place]  FDA and has been authorized for detection and/or diagnosis of SARS-CoV-2 by FDA under an Emergency Use Authorization (EUA). This EUA will remain in effect (meaning this test can be used) for the duration of the COVID-19  declaration under Section 564(b)(1) of the Act, 21 U.S.C. section 360bbb-3(b)(1), unless the authorization is terminated or revoked.     Resp Syncytial Virus by PCR NEGATIVE NEGATIVE Final    Comment: (NOTE) Fact Sheet for Patients: bloggercourse.com  Fact Sheet for Healthcare Providers: seriousbroker.it  This test is not yet approved or cleared by the [place]  FDA and has been authorized for detection and/or diagnosis of SARS-CoV-2 by FDA under an Emergency Use Authorization (EUA). This EUA will remain in effect (meaning this test can be used) for the duration of the COVID-19 declaration under Section 564(b)(1) of the Act, 21 U.S.C. section 360bbb-3(b)(1), unless the authorization is terminated or revoked.  Performed at [facility], [address] [place], [place] [address]     Labs: CBC: Recent Labs  Lab 07/19/23 1137 07/19/23 1238 07/20/23 0511 07/21/23 0522 07/22/23 0456  WBC 11.6*  --  12.2* 12.7* 12.0*  NEUTROABS 9.1*  --   --  10.2* 9.4*  HGB 13.8 12.9 12.7 12.6 13.3  HCT 40.9 38.0 41.1 39.4 41.0  MCV 86.1  --  93.2 90.2 88.4  PLT 208  --  206 203 235   Basic Metabolic Panel: Recent Labs  Lab 07/19/23 1137 07/19/23 1238 07/20/23 0511 07/21/23 0522 07/22/23 0456  NA 135 136  133* 134* 131*  K 4.3 4.0 4.0 3.7 3.5  CL 101  --  103 102 97*  CO2 26  --  23 24 23   GLUCOSE 148*  --  212* 245* 243*  BUN 13  --  17 22 24*  CREATININE 0.54  --  0.72 0.66 0.49  CALCIUM  9.6  --  8.9 8.6* 8.3*   Liver Function Tests: Recent Labs  Lab 07/20/23 0511  AST 20  ALT 22  ALKPHOS 90  BILITOT 0.6  PROT 6.2*  ALBUMIN 3.0*   CBG: Recent Labs  Lab 07/21/23 1154 07/21/23 1633 07/21/23 2114 07/22/23 0728 07/22/23 1213  GLUCAP 169* 166* 265* 208* 201*    Discharge time spent: greater than 30 minutes.  Signed: Ninnie Fein, DO [facility] 07/22/2023

## 2023-07-22 NOTE — Progress Notes (Signed)
   07/22/23 0030  BiPAP/CPAP/SIPAP  BiPAP/CPAP/SIPAP Pt Type Adult  BiPAP/CPAP/SIPAP Resmed  Dentures removed? Not applicable  Mask Size Medium  FiO2 (%) 21 %  Patient Home Machine No  Patient Home Mask No  Patient Home Tubing No  Auto Titrate Yes  Minimum cmH2O 5 cmH2O  Maximum cmH2O 15 cmH2O  Device Plugged into RED Power Outlet Yes

## 2023-07-22 NOTE — Assessment & Plan Note (Signed)
 Treated with IV Rocephin .

## 2023-07-26 ENCOUNTER — Encounter: Payer: Self-pay | Admitting: Pulmonary Disease

## 2023-07-27 NOTE — Telephone Encounter (Signed)
  Hi, i was admitted to the hospital last week. when i was discharged, i called to make appointment with you and the next available is in May. since they gave me more medicine to take, i thought i would reach out to see if you can see me sooner. thank you    Dr. Marygrace Snellen- would you be okay with seeing her for HFU in 15 min slot? No HFU slots or 30 min slots until end of May 2025. Thanks!

## 2023-07-27 NOTE — Telephone Encounter (Signed)
 15 minutes ok - thanks

## 2023-08-01 ENCOUNTER — Ambulatory Visit: Admitting: Pulmonary Disease

## 2023-08-01 ENCOUNTER — Encounter: Payer: Self-pay | Admitting: Pulmonary Disease

## 2023-08-01 VITALS — BP 136/78 | HR 60 | Ht 63.0 in | Wt 244.0 lb

## 2023-08-01 DIAGNOSIS — R0609 Other forms of dyspnea: Secondary | ICD-10-CM | POA: Diagnosis not present

## 2023-08-01 DIAGNOSIS — J45901 Unspecified asthma with (acute) exacerbation: Secondary | ICD-10-CM

## 2023-08-01 DIAGNOSIS — J4540 Moderate persistent asthma, uncomplicated: Secondary | ICD-10-CM

## 2023-08-01 MED ORDER — FLUTICASONE FUROATE-VILANTEROL 200-25 MCG/ACT IN AEPB: 1.0000 | INHALATION_SPRAY | Freq: Every day | RESPIRATORY_TRACT | 3 refills | Status: DC

## 2023-08-01 NOTE — Patient Instructions (Signed)
 Nice to see you again  Okay to continue Breo 1 puff in the morning, rinse your mouth out with water  after every use  Okay to use arformoterol  and budesonide  in the evenings or twice a day if you feel you need it more frequently  I will refill the [facility] for now, we can see if we need to continue this or just do the nebulizers in the future  Return to clinic in 3 months or sooner if needed with Dr. Marygrace Snellen

## 2023-08-01 NOTE — Progress Notes (Signed)
 @Patient  ID: Georga Killings, female    DOB: [birth year], 83 y.o.   MRN: [id number]  Chief Complaint  Patient presents with   Hospitalization Follow-up    Breathing has improved since hospital d/c. She is no longer wheezing.     Referring provider: Virgle Grime, MD  HPI:   83 y.o. woman whom are seen in follow up  for evaluation of wheeze, mild dyspnea on exertion felt to be related to asthma given historic temporary improvement with ICS and prednisone .  Discharge summary reviewed.  H&P reviewed.  Multiple clinic notes reviewed.  Returns for hospital follow-up.  Asthma has flared over the last several months prompting resumption of ICS/LABA nebulizer.  Unfortunately continued to worsen.  Prednisone  was called in.  Did not improve within 24 to 36 hours or presented to the emergency room.  Was wheezing.  Given IV steroids.  Labs reassuring.  Chest x-ray clear on my review interpretation.  Quickly improved.  Discharged home on Breo as well as nebulizers.  HPI at initial visit: Patient was in usual health.  Developed ear pain, sore throat, runny nose, cough, chest tightness and wheeze.  This was April 2023.  Got some antibiotics.  Mild improvement.  Seen by PCP.  Given prednisone  and additional antibiotics.  Prednisone  helped immensely.  Sore throat, ear pain, cough largely improved.  Mild residual cough.  Wheezes main thing that persist.  No time of day when things are better or worse.  No position make things better or worse.  No seasonal environmental factors she can identify to make things better or worse.  Does not use albuterol  afraid of how will make her feel.  Prescribe budesonide  nebulized 0.25 mg twice daily.  Feels this does help a bit.  Helps with decreasing the frequency of wheeze.  She denies significant allergy symptoms except for the spring.  No recurrent bronchitis.  No prior history of asthma.  No other respiratory illness.  She had chest x-ray 08/2021 that on my review  interpretation reveals clear lungs bilaterally.  Most recent cross-sectional imaging CTA chest 10/2019 reviewed interpreted as clear lungs bilaterally.  PMH: OSA on CPAP hypertension, Surgical history: Tubal ligation, total hysterectomy and oophorectomy, D&C of uterus, rotator cuff repair Family history: Mother with CAD, hyperlipidemia, diabetes, Father with CAD Social history: Never smoker, lives in [place] / Pulmonary Flowsheets:   ACT:  Asthma Control Test ACT Total Score  06/22/2023 11:09 AM 17    MMRC:     No data to display          Epworth:      No data to display          Tests:   FENO:  No results found for: "NITRICOXIDE"  PFT:    Latest Ref Rng & Units 03/08/2022    8:54 AM  PFT Results  FVC-Pre L 1.93   FVC-Predicted Pre % 87   FVC-Post L 2.00   FVC-Predicted Post % 90   Pre FEV1/FVC % % 77   Post FEV1/FCV % % 84   FEV1-Pre L 1.49   FEV1-Predicted Pre % 91   FEV1-Post L 1.67   DLCO uncorrected ml/min/mmHg 13.40   DLCO UNC% % 79   DLCO corrected ml/min/mmHg 13.40   DLCO COR %Predicted % 79   DLVA Predicted % 91   TLC L 3.67   TLC % Predicted % 79   RV % Predicted % 73   Percent.  Normal  spirometry, no significant vasodilators found, 1 lines of mild restriction, DLCO within normal limits  WALK:      No data to display        Personally reviewed interpret as normal spirometry, no significant bronchodilator response.  Lung volumes within normal notes.  DLCO within normal limits.  Imaging: Personally reviewed and as per EMR discussion this note VAS US  LOWER EXTREMITY VENOUS (DVT) Result Date: 07/20/2023  Lower Venous DVT Study Patient Name:  CAROLYNN BOOTHBY DE PAULA  Date of Exam:   07/20/2023 Medical Rec #: [id number]              Accession #:    [id number] Date of Birth: [birth year]              Patient Gender: F Patient Age:   83 years Exam Location:  [facility] Procedure:      VAS US  LOWER EXTREMITY VENOUS (DVT)  Referring Phys: [facility] GOEL --------------------------------------------------------------------------------  Indications: Swelling, Edema, SOB, and stroke. Other Indications: H/O DVT, CHF. Comparison Study: Previous study on 12.29.2015 Performing Technologist: Ria Chad  Examination Guidelines: A complete evaluation includes B-mode imaging, spectral Doppler, color Doppler, and power Doppler as needed of all accessible portions of each vessel. Bilateral testing is considered an integral part of a complete examination. Limited examinations for reoccurring indications may be performed as noted. The reflux portion of the exam is performed with the patient in reverse Trendelenburg.  +---------+---------------+---------+-----------+----------+-------------------+ RIGHT    CompressibilityPhasicitySpontaneityPropertiesThrombus Aging      +---------+---------------+---------+-----------+----------+-------------------+ CFV      Full           Yes      Yes                                      +---------+---------------+---------+-----------+----------+-------------------+ SFJ      Full           Yes      Yes                                      +---------+---------------+---------+-----------+----------+-------------------+ FV Prox  Full                                                             +---------+---------------+---------+-----------+----------+-------------------+ FV Mid   Full                                                             +---------+---------------+---------+-----------+----------+-------------------+ FV DistalFull                                                             +---------+---------------+---------+-----------+----------+-------------------+ PFV      Full                                                             +---------+---------------+---------+-----------+----------+-------------------+  POP      Full           Yes       Yes                                      +---------+---------------+---------+-----------+----------+-------------------+ PTV                                                   Not well visualized +---------+---------------+---------+-----------+----------+-------------------+ PERO                                                  Not well visualized +---------+---------------+---------+-----------+----------+-------------------+   +---------+---------------+---------+-----------+----------+-------------------+ LEFT     CompressibilityPhasicitySpontaneityPropertiesThrombus Aging      +---------+---------------+---------+-----------+----------+-------------------+ CFV      Full           Yes      Yes                                      +---------+---------------+---------+-----------+----------+-------------------+ SFJ      Full           Yes      Yes                                      +---------+---------------+---------+-----------+----------+-------------------+ FV Prox  Full                                                             +---------+---------------+---------+-----------+----------+-------------------+ FV Mid   Full           Yes      Yes                                      +---------+---------------+---------+-----------+----------+-------------------+ FV DistalFull           Yes      Yes                                      +---------+---------------+---------+-----------+----------+-------------------+ PFV      Full                                                             +---------+---------------+---------+-----------+----------+-------------------+ POP      Full           Yes      Yes                                      +---------+---------------+---------+-----------+----------+-------------------+  PTV                                                   Not well visualized  +---------+---------------+---------+-----------+----------+-------------------+ PERO                                                  Not well visualized +---------+---------------+---------+-----------+----------+-------------------+     Summary: RIGHT: - There is no evidence of deep vein thrombosis in the lower extremity. However, portions of this examination were limited- see technologist comments above.  - No cystic structure found in the popliteal fossa.  LEFT: - There is no evidence of deep vein thrombosis in the lower extremity. However, portions of this examination were limited- see technologist comments above.  - No cystic structure found in the popliteal fossa.  *See table(s) above for measurements and observations. Electronically signed by Irvin Mantel on 07/20/2023 at 8:05:02 PM.    Final    DG Chest 2 View Result Date: 07/19/2023 CLINICAL DATA:  Shortness of breath. EXAM: CHEST - 2 VIEW COMPARISON:  06/22/2023. FINDINGS: Bilateral lung fields are clear. Bilateral costophrenic angles are clear. Stable cardio-mediastinal silhouette. No acute osseous abnormalities. The soft tissues are within normal limits. IMPRESSION: *No active cardiopulmonary disease. Electronically Signed   By: Beula Brunswick M.D.   On: 07/19/2023 11:55   DG UGI W DOUBLE CM (HD BA) Result Date: 07/15/2023 CLINICAL DATA:  Dysphagia, bulging, epigastric pain, globus sensation. EXAM: UPPER GI SERIES WITH KUB TECHNIQUE: After obtaining a scout radiograph a routine upper GI series was performed using thin and high density barium. FLUOROSCOPY: Radiation Exposure Index (as provided by the fluoroscopic device): 24.1 mGy Kerma COMPARISON:  None Available. FINDINGS: Scout view of the abdomen shows moderate stool burden. No small bowel dilatation. No unexpected radiopaque calculi. Degenerative changes in the spine. Scarring in the left lung base. Double contrast examination of the upper gastrointestinal tract shows occasionally  sluggish esophageal motility. No esophageal fold thickening, stricture or obstruction. Tiny hiatal hernia. Stomach and duodenal bulb are unremarkable. IMPRESSION: 1. Occasionally sluggish esophageal motility. 2. Tiny hiatal hernia. Electronically Signed   By: Shearon Denis M.D.   On: 07/15/2023 09:21    Lab Results: Personally reviewed CBC    Component Value Date/Time   WBC 12.0 (H) 07/22/2023 0456   RBC 4.64 07/22/2023 0456   HGB 13.3 07/22/2023 0456   HGB 13.1 12/20/2022 1035   HGB 13.3 10/17/2019 0914   HGB 12.4 12/13/2014 0857   HCT 41.0 07/22/2023 0456   HCT 41.2 10/17/2019 0914   HCT 37.4 12/13/2014 0857   PLT 235 07/22/2023 0456   PLT 217 12/20/2022 1035   PLT 267 10/17/2019 0914   MCV 88.4 07/22/2023 0456   MCV 86 10/17/2019 0914   MCV 84.2 12/13/2014 0857   MCH 28.7 07/22/2023 0456   MCHC 32.4 07/22/2023 0456   RDW 13.2 07/22/2023 0456   RDW 13.1 10/17/2019 0914   RDW 14.7 (H) 12/13/2014 0857   LYMPHSABS 1.8 07/22/2023 0456   LYMPHSABS 6.3 (H) 12/13/2014 0857   MONOABS 0.6 07/22/2023 0456   MONOABS 0.7 12/13/2014 0857   EOSABS 0.0 07/22/2023 0456   EOSABS 0.4 12/13/2014 0857   BASOSABS 0.0 07/22/2023  0456   BASOSABS 0.1 12/13/2014 0857    BMET    Component Value Date/Time   NA 131 (L) 07/22/2023 0456   NA 141 07/13/2023 0817   NA 137 12/13/2014 0857   K 3.5 07/22/2023 0456   K 3.8 12/13/2014 0857   CL 97 (L) 07/22/2023 0456   CO2 23 07/22/2023 0456   CO2 28 12/13/2014 0857   GLUCOSE 243 (H) 07/22/2023 0456   GLUCOSE 98 12/13/2014 0857   BUN 24 (H) 07/22/2023 0456   BUN 9 07/13/2023 0817   BUN 10.6 12/13/2014 0857   CREATININE 0.49 07/22/2023 0456   CREATININE 0.60 12/20/2022 1035   CREATININE 0.7 12/13/2014 0857   CALCIUM  8.3 (L) 07/22/2023 0456   CALCIUM  8.8 12/13/2014 0857   GFRNONAA >60 07/22/2023 0456   GFRNONAA >60 12/20/2022 1035   GFRAA >60 12/06/2019 0923    BNP    Component Value Date/Time   BNP 198.0 (H) 07/20/2023 0511     ProBNP    Component Value Date/Time   PROBNP 191 09/22/2022 1622   PROBNP 64.6 09/15/2012 2345    Specialty Problems       Pulmonary Problems   Obstructive sleep apnea   Asthma exacerbation    Allergies  Allergen Reactions   Iohexol  Rash and Other (See Comments)    Patient stated a rash appeared after a CT scan    Hydrochlorothiazide  Other (See Comments)    12/18/21: Palpitations and SOB per the pt - AS    Immunization History  Administered Date(s) Administered   Influenza, High Dose Seasonal PF 12/26/2014, 12/15/2015   Influenza, Quadrivalent, Recombinant, Inj, Pf 01/06/2017, 01/04/2019, 01/11/2020, 01/16/2021, 01/27/2023   Influenza,inj,Quad PF,6+ Mos 12/20/2013   Influenza-Unspecified 01/04/2012, 12/04/2016, 12/05/2018   PFIZER Comirnaty(Gray Top)Covid-19 Tri-Sucrose Vaccine 08/15/2020   PFIZER(Purple Top)SARS-COV-2 Vaccination 04/17/2019, 05/08/2019, 12/03/2019, 12/04/2019   PNEUMOCOCCAL CONJUGATE-20 05/06/2021   Pfizer Covid-19 Vaccine Bivalent Booster 100yrs & up 01/07/2021   Pneumococcal Conjugate-13 02/10/2015   Pneumococcal Polysaccharide-23 11/02/2010, 01/04/2012   Tdap 11/02/2010    Past Medical History:  Diagnosis Date   BMI 40.0-44.9, adult (HCC)    CAD in native artery, stent to RCA BMS with MI 2015 11/04/2003   a. inferior STEMI 01/2014 s/p BMS to prox RCA.   DVT (deep venous thrombosis) (HCC) 2003   lower extremity DVT with questionable recurrence in 2005. Another admission 2015.   Endometrial cancer (HCC) 2021   Endometrial thickening on ultra sound 07/2009   path showing Fragments of benign endocervical and squamous mucosa. No dysplasia or malignancy // Followed by Dr. Deann Exon   First degree AV block    Gastric ulcer    GERD (gastroesophageal reflux disease)    GI bleed    Hemorrhoid    Hyperlipidemia LDL goal <70    Hypertension    Insomnia 11/02/2010   Memory difficulties 11/02/2010   Mild dilation of ascending aorta (HCC)    Morbid  obesity (HCC) 09/16/2012   Obstructive sleep apnea 09/23/2010   Pre-diabetes    Pulmonary nodule    ST elevation myocardial infarction (STEMI) of inferior wall (HCC) 01/24/2014   Stroke (HCC) 2015   Syncope 12/18/2013   Syncope, near 12/14/2018   TIA (transient ischemic attack) 09/23/2010   09/13/2010: CT, MRI/MRA no acute process. Carotid doppler: no stenosis. 2D-Echo Normal LV function with an EF 55-60%.    Trifascicular block    UTI (urinary tract infection) 01/27/2014    Tobacco History: Social History   Tobacco Use  Smoking Status Never  Smokeless Tobacco Never   Counseling given: Not Answered   Continue to not smoke  Outpatient Encounter Medications as of 08/01/2023  Medication Sig   acetaminophen  (TYLENOL ) 500 MG tablet Take 500 mg by mouth 2 (two) times daily as needed (for pain).   albuterol  (PROVENTIL ) (2.5 MG/3ML) 0.083% nebulizer solution Take 3 mLs (2.5 mg total) by nebulization every 4 (four) hours as needed for wheezing or shortness of breath.   albuterol  (VENTOLIN  HFA) 108 (90 Base) MCG/ACT inhaler INHALE 2 PUFFS INTO THE LUNGS EVERY 4 HOURS AS NEEDED FOR WHEEZING OR SHORTNESS OF BREATH.   amLODipine  (NORVASC ) 10 MG tablet Take 10 mg by mouth daily.   arformoterol  (BROVANA ) 15 MCG/2ML NEBU Take 2 mLs (15 mcg total) by nebulization 2 (two) times daily.   atorvastatin  (LIPITOR ) 80 MG tablet TAKE 1 TABLET BY MOUTH EVERY DAY   budesonide  (PULMICORT ) 0.5 MG/2ML nebulizer solution Take 2 mLs (0.5 mg total) by nebulization 2 (two) times daily.   busPIRone  (BUSPAR ) 7.5 MG tablet Take 7.5 mg by mouth 2 (two) times daily.   cetirizine (ZYRTEC) 10 MG tablet Take 10 mg by mouth daily.   cholecalciferol (VITAMIN D3) 25 MCG (1000 UNIT) tablet Take 1,000 Units by mouth daily.   ELIQUIS  2.5 MG TABS tablet TAKE 1 TABLET BY MOUTH TWICE A DAY   ferrous sulfate  325 (65 FE) MG tablet Take 325 mg by mouth daily with breakfast.   furosemide  (LASIX ) 40 MG tablet Take 1 tablet (40 mg  total) by mouth daily.   hydrALAZINE  (APRESOLINE ) 25 MG tablet Take 1 tablet (25 mg total) by mouth every 6 (six) hours.   ibuprofen (ADVIL) 200 MG tablet Take 200 mg by mouth every 6 (six) hours as needed for headache or moderate pain.   metFORMIN (GLUCOPHAGE-XR) 500 MG 24 hr tablet Take 500 mg by mouth daily with breakfast.   metoprolol  succinate (TOPROL  XL) 50 MG 24 hr tablet Take 1 tablet (50 mg total) by mouth daily. Take with or immediately following a meal.   Multiple Vitamin (MULTIVITAMIN WITH MINERALS) TABS Take 1 tablet by mouth every morning.   Naphazoline HCl (CLEAR EYES OP) Place 1 drop into both eyes as needed (for irritation or dryness).    nitroGLYCERIN  (NITROSTAT ) 0.4 MG SL tablet Place 1 tablet (0.4 mg total) under the tongue every 5 (five) minutes x 3 doses as needed for chest pain.   omeprazole  (PRILOSEC) 20 MG capsule Take 1 capsule (20 mg total) by mouth every other day. (Patient taking differently: Take 20 mg by mouth daily.)   potassium chloride  SA (KLOR-CON  M) 20 MEQ tablet Take 1 tablet (20 mEq total) by mouth daily.   predniSONE  (DELTASONE ) 20 MG tablet Take 2 tablets (40 mg total) by mouth daily with breakfast for 3 days, THEN 1.5 tablets (30 mg total) daily with breakfast for 3 days, THEN 1 tablet (20 mg total) daily with breakfast for 3 days, THEN 0.5 tablets (10 mg total) daily with breakfast for 3 days.   Pseudoeph-Bromphen-DM (BROMFED DM PO) Take 5 mLs by mouth 3 (three) times daily.   sodium chloride  (OCEAN) 0.65 % SOLN nasal spray Place 1 spray into both nostrils as needed for congestion.   Spacer/Aero-Holding Chambers (AEROCHAMBER MV) inhaler Use as instructed   telmisartan (MICARDIS) 80 MG tablet Take 80 mg by mouth daily.   [DISCONTINUED] fluticasone  furoate-vilanterol (BREO ELLIPTA ) 200-25 MCG/ACT AEPB Inhale 1 puff into the lungs daily.   fluticasone  furoate-vilanterol (BREO ELLIPTA ) 200-25  MCG/ACT AEPB Inhale 1 puff into the lungs daily.   No  facility-administered encounter medications on file as of 08/01/2023.     Review of Systems  Review of Systems  N/a Physical Exam  BP 136/78 (BP Location: Left Arm, Cuff Size: Large)   Pulse 60   Ht 5\' 3"  (1.6 m)   Wt 244 lb (110.7 kg)   SpO2 99%   BMI 43.22 kg/m   Wt Readings from Last 5 Encounters:  08/01/23 244 lb (110.7 kg)  07/22/23 236 lb 5.3 oz (107.2 kg)  07/06/23 245 lb 2 oz (111.2 kg)  06/22/23 246 lb 3.2 oz (111.7 kg)  06/17/23 246 lb 6.4 oz (111.8 kg)    BMI Readings from Last 5 Encounters:  08/01/23 43.22 kg/m  07/22/23 41.86 kg/m  07/06/23 43.42 kg/m  06/22/23 43.61 kg/m  06/17/23 43.65 kg/m     Physical Exam General: Well-appearing, no acute distress Eyes: EOMI, no icterus Neck: Supple, no JVP Pulmonary: Clear,NWOB Cardiovascular: Warm MSK: No synovitis, no joint effusion Abdomen: Nondistended, bowel sounds present Neuro: Normal gait, no weakness Psych: Normal mood, full affect   Assessment & Plan:   Dyspnea on exertion: Overall improved with asthma therapies.  Escalate as below given cough and wheeze.  PFTs performed 03/2022 are normal.  Cough, wheeze due to asthma with recent exacerbation: Suspect related to asthma, triggered after viral illness 05/2023.  Prolonged exacerbation culminating in hospitalization 07/2023.  Mild improvement ICS/LABA but then worsened prompting hospitalization 07/2023.  Continue nebulized ICS/LABA as well as addition of Breo daily.  Albuterol  as needed.  Return in about 3 months (around 10/31/2023).   Guerry Leek, MD 08/01/2023  I spent 40 minutes in face-to-face visit, review of records, coordination of care.

## 2023-08-03 DIAGNOSIS — R600 Localized edema: Secondary | ICD-10-CM | POA: Diagnosis not present

## 2023-08-03 DIAGNOSIS — Z09 Encounter for follow-up examination after completed treatment for conditions other than malignant neoplasm: Secondary | ICD-10-CM | POA: Diagnosis not present

## 2023-08-03 DIAGNOSIS — I10 Essential (primary) hypertension: Secondary | ICD-10-CM | POA: Diagnosis not present

## 2023-08-03 DIAGNOSIS — J4540 Moderate persistent asthma, uncomplicated: Secondary | ICD-10-CM | POA: Diagnosis not present

## 2023-08-12 ENCOUNTER — Ambulatory Visit: Attending: Cardiology | Admitting: Pharmacist

## 2023-08-12 VITALS — BP 150/98 | HR 83

## 2023-08-12 DIAGNOSIS — I10 Essential (primary) hypertension: Secondary | ICD-10-CM | POA: Diagnosis not present

## 2023-08-12 MED ORDER — FUROSEMIDE 40 MG PO TABS
40.0000 mg | ORAL_TABLET | Freq: Two times a day (BID) | ORAL | 3 refills | Status: AC
Start: 2023-08-12 — End: ?

## 2023-08-12 NOTE — Progress Notes (Signed)
 Patient ID: Shirley [name]                 DOB: Oct 20, 1940                      MRN: [id number]      HPI: Shirley [name] is a 83 y.o. female referred by Dr. Abel Hoe to HTN clinic. PMH is significant for CAD with inferior STEMI October 2015, HTN, DVT, prior CVA, prior GI bleeding, chronic diastolic CHF and sleep apnea.   Patient was seen by Dr. Abel Hoe 06/17/23.  Blood pressure was elevated and patient had lower extremity edema.  Furosemide  was increased to 40 mg twice daily and she was recommended to wear compression stockings and elevate her feet.  Her metoprolol  was also increased to 50 mg daily.  She was seen in the ER 07/19/23 for asthma exacerbation.  Hydralazine  25 mg every 6 hours was added.  Patient presents today accompanied by her daughter.  She states that she has not been taking telmisartan since she was discharged from the hospital.  Thought she was not supposed to take because she was given hydralazine .  Her daughter was unaware that she was not taking this.  Blood pressures at home have been erratic.  She was initially taking hydralazine  4 times a day but her primary care doctor recently told her to take it just twice a day.  Sometimes she takes it 3 times a day depending on what her blood pressure is.  If her blood pressure is in the 120s and she will not take it but if it is elevated she will take it. Home readings have ranged from 120-150/68-80's  HR 55-63 Of note, patient states she has not taken her blood pressure medicines today.  She generally takes all of them in the morning, but does not like to eat before her doctors appointments and therefore has not taken any of her medicines.   Swelling on exam.  Improved with the increased dose of Lasix .  Patient is wearing compression stockings today.  Per patient swelling has been chronic for the last 4 years.  No longer painful.  Current HTN meds: Amlodipine  10 mg daily, hydralazine  25 mg every 6 hours, metoprolol   succinate 50 mg daily, telmisartan 80 mg daily, furosemide  40 mg twice a day Previously tried:  BP goal: Less than 130/80  Family History:  Family History  Problem Relation Age of Onset   Diabetes Mother    Hyperlipidemia Mother    Heart disease Mother    Dementia Mother    Angina Father    Heart attack Father    Thyroid  cancer Sister    Liver cancer Sister    Heart disease Brother        x 2 in 20s yo   Prostate cancer Brother 56   Alzheimer's disease Maternal Grandmother    Cirrhosis Daughter    Stroke Neg Hx    Colon cancer Neg Hx    Esophageal cancer Neg Hx    Rectal cancer Neg Hx    Stomach cancer Neg Hx    Ovarian cancer Neg Hx    Endometrial cancer Neg Hx     Social History: Not assessed today  Diet: Not assessed  Exercise: Not addressed  Home BP readings: 120-150/68-80's (few diastolics in the 90s) HR 55-63   Wt Readings from Last 3 Encounters:  08/01/23 244 lb (110.7 kg)  07/22/23 236 lb 5.3 oz (107.2  kg)  07/06/23 245 lb 2 oz (111.2 kg)   BP Readings from Last 3 Encounters:  08/12/23 (!) 150/98  08/01/23 136/78  07/22/23 (!) 148/79   Pulse Readings from Last 3 Encounters:  08/12/23 83  08/01/23 60  07/22/23 (!) 59    Renal function: CrCl cannot be calculated (Patient's most recent lab result is older than the maximum 21 days allowed.).  Past Medical History:  Diagnosis Date   BMI 40.0-44.9, adult (HCC)    CAD in native artery, stent to RCA BMS with MI 2015 11/04/2003   a. inferior STEMI 01/2014 s/p BMS to prox RCA.   DVT (deep venous thrombosis) (HCC) 2003   lower extremity DVT with questionable recurrence in 2005. Another admission 2015.   Endometrial cancer (HCC) 2021   Endometrial thickening on ultra sound 07/2009   path showing Fragments of benign endocervical and squamous mucosa. No dysplasia or malignancy // Followed by Dr. Deann Exon   First degree AV block    Gastric ulcer    GERD (gastroesophageal reflux disease)    GI bleed     Hemorrhoid    Hyperlipidemia LDL goal <70    Hypertension    Insomnia 11/02/2010   Memory difficulties 11/02/2010   Mild dilation of ascending aorta (HCC)    Morbid obesity (HCC) 09/16/2012   Obstructive sleep apnea 09/23/2010   Pre-diabetes    Pulmonary nodule    ST elevation myocardial infarction (STEMI) of inferior wall (HCC) 01/24/2014   Stroke (HCC) 2015   Syncope 12/18/2013   Syncope, near 12/14/2018   TIA (transient ischemic attack) 09/23/2010   09/13/2010: CT, MRI/MRA no acute process. Carotid doppler: no stenosis. 2D-Echo Normal LV function with an EF 55-60%.    Trifascicular block    UTI (urinary tract infection) 01/27/2014    Current Outpatient Medications on File Prior to Visit  Medication Sig Dispense Refill   albuterol  (PROVENTIL ) (2.5 MG/3ML) 0.083% nebulizer solution Take 3 mLs (2.5 mg total) by nebulization every 4 (four) hours as needed for wheezing or shortness of breath. 375 mL 2   albuterol  (VENTOLIN  HFA) 108 (90 Base) MCG/ACT inhaler INHALE 2 PUFFS INTO THE LUNGS EVERY 4 HOURS AS NEEDED FOR WHEEZING OR SHORTNESS OF BREATH. 18 each 6   amLODipine  (NORVASC ) 10 MG tablet Take 10 mg by mouth daily.     arformoterol  (BROVANA ) 15 MCG/2ML NEBU Take 2 mLs (15 mcg total) by nebulization 2 (two) times daily. 120 mL 3   atorvastatin  (LIPITOR ) 80 MG tablet TAKE 1 TABLET BY MOUTH EVERY DAY 90 tablet 1   budesonide  (PULMICORT ) 0.5 MG/2ML nebulizer solution Take 2 mLs (0.5 mg total) by nebulization 2 (two) times daily. 120 mL 6   busPIRone  (BUSPAR ) 7.5 MG tablet Take 7.5 mg by mouth 2 (two) times daily.     cetirizine (ZYRTEC) 10 MG tablet Take 10 mg by mouth daily.     cholecalciferol (VITAMIN D3) 25 MCG (1000 UNIT) tablet Take 1,000 Units by mouth daily.     ELIQUIS  2.5 MG TABS tablet TAKE 1 TABLET BY MOUTH TWICE A DAY 60 tablet 5   fluticasone  furoate-vilanterol (BREO ELLIPTA ) 200-25 MCG/ACT AEPB Inhale 1 puff into the lungs daily. 60 each 3   metFORMIN (GLUCOPHAGE-XR) 500  MG 24 hr tablet Take 500 mg by mouth daily with breakfast.     metoprolol  succinate (TOPROL  XL) 50 MG 24 hr tablet Take 1 tablet (50 mg total) by mouth daily. Take with or immediately following a meal. 90 tablet  3   Multiple Vitamin (MULTIVITAMIN WITH MINERALS) TABS Take 1 tablet by mouth every morning.     Naphazoline HCl (CLEAR EYES OP) Place 1 drop into both eyes as needed (for irritation or dryness).      nitroGLYCERIN  (NITROSTAT ) 0.4 MG SL tablet Place 1 tablet (0.4 mg total) under the tongue every 5 (five) minutes x 3 doses as needed for chest pain. 25 tablet 3   omeprazole  (PRILOSEC) 20 MG capsule Take 1 capsule (20 mg total) by mouth every other day. (Patient taking differently: Take 20 mg by mouth daily.) 45 capsule 3   potassium chloride  SA (KLOR-CON  M) 20 MEQ tablet Take 1 tablet (20 mEq total) by mouth daily. 90 tablet 3   sodium chloride  (OCEAN) 0.65 % SOLN nasal spray Place 1 spray into both nostrils as needed for congestion.     acetaminophen  (TYLENOL ) 500 MG tablet Take 500 mg by mouth 2 (two) times daily as needed (for pain).     hydrALAZINE  (APRESOLINE ) 25 MG tablet Take 1 tablet (25 mg total) by mouth every 6 (six) hours. (Patient taking differently: Take 25 mg by mouth 2 (two) times daily.) 120 tablet 0   Spacer/Aero-Holding Chambers (AEROCHAMBER MV) inhaler Use as instructed 1 each 0   telmisartan (MICARDIS) 80 MG tablet Take 80 mg by mouth daily. (Patient not taking: Reported on 08/12/2023)     No current facility-administered medications on file prior to visit.    Allergies  Allergen Reactions   Iohexol  Rash and Other (See Comments)    Patient stated a rash appeared after a CT scan    Hydrochlorothiazide  Other (See Comments)    12/18/21: Palpitations and SOB per the pt - AS    Blood pressure (!) 150/98, pulse 83.   Assessment/Plan:  HYPERTENSION CONTROL Vitals:   08/12/23 1008 08/12/23 1012  BP: (!) 152/98 (!) 150/98    The patient's blood pressure is elevated  above target today.  In order to address the patient's elevated BP: A new medication was prescribed today.      1. Hypertension -   Hypertension Assessment: Blood pressure elevated in clinic today.  Of note patient has not taken any of her morning blood pressure medicines Home readings variable and several above goal Patient has not been taking telmisartan due to misunderstanding Admits to occasional headache, chronic swelling which is improved after increasing furosemide  to 40 mg twice a day  Plan: Resume telmisartan 80 mg daily Continue amlodipine  10 mg daily, metoprolol  succinate 50 mg daily, furosemide  40 mg twice a day Use hydralazine  25 mg as needed for blood pressure greater than 140/85 Follow-up in 1 month   Thank you  Marquasia Schmieder D Aeriana Speece, Pharm.Monika Annas, CPP [facility] of [facility] [address] [place], [place] [address]  Phone: [phone]; Fax: [phone]

## 2023-08-12 NOTE — Patient Instructions (Addendum)
 Your blood pressure goal is < 130/52mmHg   Please resume taking telmisartan 80mg  daily Continue taking amlodipine  10 mg daily, hydralazine  25 mg if blood pressure is >145/85, metoprolol  succinate 50 mg daily, and furosemide  40 mg twice a day  Please bring your blood pressure log and machine with you to your next appointment  Important lifestyle changes to control high blood pressure  Intervention  Effect on the BP   Weight loss Weight loss is one of the most effective lifestyle changes for controlling blood pressure. If you're overweight or obese, losing even a small amount of weight can help reduce blood pressure.    Blood pressure can decrease by 1 millimeter of mercury (mmHg) with each kilogram (about 2.2 pounds) of weight lost.   Exercise regularly As a general goal, aim for 30 minutes of moderate physical activity every day.    Regular physical activity can lower blood pressure by 5 - 8 mmHg.   Eat a healthy diet Eat a diet rich in whole grains, fruits, vegetables, lean meat, and low-fat dairy products. Limit processed foods, saturated fat, and sweets.    A heart-healthy diet can lower high blood pressure by 10 mmHg.   Reduce salt (sodium) in your diet Aim for 000mg  of sodium each day. Avoid deli meats, canned food, and frozen microwave meals which are high in sodium.     Limiting sodium can reduce blood pressure by 5 mmHg.   Limit alcohol  One drink equals 12 ounces of beer, 5 ounces of wine, or 1.5 ounces of 80-proof liquor.    Limiting alcohol  to < 1 drink a day for women or < 2 drinks a day for men can help lower blood pressure by about 4 mmHg.   To check your pressure at home you will need to:   Sit up in a chair, with feet flat on the floor and back supported. Do not cross your ankles or legs. Rest your left arm so that the cuff is about heart level. If the cuff goes on your upper arm, then just relax your arm on the table, arm of the chair, or your lap. If you  have a wrist cuff, hold your wrist against your chest at heart level. Place the cuff snugly around your arm, about 1 inch above the crease of your elbow. The cords should be inside the groove of your elbow.  Sit quietly, with the cuff in place, for about 5 minutes. Then press the power button to start a reading. Do not talk or move while the reading is taking place.  Record your readings on a sheet of paper. Although most cuffs have a memory, it is often easier to see a pattern developing when the numbers are all in front of you.  You can repeat the reading after 1-3 minutes if it is recommended.   Make sure your bladder is empty and you have not had caffeine  or tobacco within the last 30 minutes   Always bring your blood pressure log with you to your appointments. If you have not brought your monitor in to be double checked for accuracy, please bring it to your next appointment.   You can find a list of validated (accurate) blood pressure cuffs at: validatebp.org

## 2023-08-12 NOTE — Assessment & Plan Note (Signed)
 Assessment: Blood pressure elevated in clinic today.  Of note patient has not taken any of her morning blood pressure medicines Home readings variable and several above goal Patient has not been taking telmisartan due to misunderstanding Admits to occasional headache, chronic swelling which is improved after increasing furosemide  to 40 mg twice a day  Plan: Resume telmisartan 80 mg daily Continue amlodipine  10 mg daily, metoprolol  succinate 50 mg daily, furosemide  40 mg twice a day Use hydralazine  25 mg as needed for blood pressure greater than 140/85 Follow-up in 1 month

## 2023-08-13 ENCOUNTER — Telehealth: Payer: Self-pay | Admitting: Cardiology

## 2023-08-13 MED ORDER — AMLODIPINE BESYLATE 10 MG PO TABS: 10.0000 mg | ORAL_TABLET | Freq: Every day | ORAL | 3 refills | Status: AC

## 2023-08-13 NOTE — Telephone Encounter (Signed)
 Patient's daughter called the answering service this morning.  Her mother is supposed to be taking amlodipine  10 mg daily, telmisartan 80 mg daily, metoprolol  succinate 50 mg daily, Lasix  40 mg twice daily.  Also has hydralazine  25 mg as needed for high blood pressure.  Patient's daughter went to refill her amlodipine  this morning and the pharmacy did not have the medication on file.  I reviewed Charlotte Cookey PharmD note from yesterday- confirmed that patient is suppose to be taking amlodipine  10 mg daily. Refills sent to her requested pharmacy  Debria Fang, PA-C 08/13/2023 11:02 AM

## 2023-08-18 ENCOUNTER — Encounter: Payer: Self-pay | Admitting: Primary Care

## 2023-08-18 ENCOUNTER — Ambulatory Visit: Admitting: Primary Care

## 2023-08-18 ENCOUNTER — Ambulatory Visit: Admitting: Gastroenterology

## 2023-08-18 VITALS — BP 124/62 | HR 64 | Temp 98.4°F | Ht 63.0 in | Wt 244.8 lb

## 2023-08-18 DIAGNOSIS — J4540 Moderate persistent asthma, uncomplicated: Secondary | ICD-10-CM | POA: Diagnosis not present

## 2023-08-18 DIAGNOSIS — Z8669 Personal history of other diseases of the nervous system and sense organs: Secondary | ICD-10-CM | POA: Diagnosis not present

## 2023-08-18 MED ORDER — FLUTICASONE FUROATE-VILANTEROL 200-25 MCG/ACT IN AEPB
1.0000 | INHALATION_SPRAY | Freq: Every day | RESPIRATORY_TRACT | 3 refills | Status: DC
Start: 2023-08-18 — End: 2023-12-14

## 2023-08-18 NOTE — Patient Instructions (Addendum)
-  ASTHMA: Asthma is a condition where your airways narrow and swell, making it difficult to breathe. You have shown mild improvement but had a recent flare-up that required hospitalization. Continue using your nebulized Brovana  and Pulmicort , and resume taking Breo Ellipta  as prescribed.  -OBSTRUCTIVE SLEEP APNEA: Obstructive sleep apnea is a condition where your breathing repeatedly stops and starts during sleep. Your current CPAP machine is outdated and not functioning well. We will order a new sleep study to reassess your condition and determine the appropriate CPAP settings. In the meantime, try to sleep with your head elevated or on your side, avoid alcohol  before bed, and do not drive if you feel fatigued.  -PERIPHERAL EDEMA: Peripheral edema is swelling caused by fluid retention, usually in the legs. Continue taking Lasix  as directed.  INSTRUCTIONS: Please schedule an in-lab split-night sleep study to reassess your sleep apnea and determine the appropriate CPAP settings. Continue taking your medications as prescribed and follow the lifestyle recommendations provided. If you experience any new or worsening symptoms, please contact our office immediately.  Follow-up 3 months with Dr. Marygrace Snellen or Jerlene Moody NP (recall may already be in chart)

## 2023-08-18 NOTE — Progress Notes (Signed)
 @Patient  ID: Shirley [name], female    DOB: 1940-07-23, 83 y.o.   MRN: [id number]  Chief Complaint  Patient presents with   Follow-up    Post-hospital.Sob better, cough dry occass.    Referring provider: Virgle Grime, MD  HPI: 83 year old female, never smoked. PMH significant for HTN, CAD, TIA, asthma, OSA, hyperlipidemia, obesity. Patient of Dr. Marygrace Snellen.   08/18/2023 Discussed the use of AI scribe software for clinical note transcription with the patient, who gave verbal consent to proceed.  History of Present Illness   Shirley [name] is an 83 year old female with asthma and coronary artery disease who presents for a hospital follow-up regarding her CPAP machine and medication management.   She is experiencing mild improvement in her breathing. She is currently using nebulized Brovana  and Pulmicort , and she has been off Breo for about a week but has a new box at home.   She has a history of sleep apnea diagnosed several years ago due to oxygen desaturation into the eighties. She owns a CPAP machine, which she has not been using regularly. The machine is over 37 years old, and she has experienced issues with condensation causing discomfort. She mailed off the filter to a company that is no longer in service, and the machine is a [employer] system one. She wakes up during the night to use the restroom and sometimes takes naps during the day. She does not have a set bedtime but usually goes to bed around 10 PM and wakes up around 6:30 AM.  She has a history of coronary artery disease, myocardial infarction, and stroke. She takes Lasix  for leg swelling, which she manages by elevating her legs. She does not live alone and does not drive due to her stroke history.  In the review of symptoms, she reports occasional cough and improved shortness of breath. No snoring or awareness of waking up gasping or choking, but her father has noted this in the past. She  does not consume alcohol .      Allergies  Allergen Reactions   Iohexol  Rash and Other (See Comments)    Patient stated a rash appeared after a CT scan    Hydrochlorothiazide  Other (See Comments)    12/18/21: Palpitations and SOB per the pt - AS    Immunization History  Administered Date(s) Administered   Influenza, High Dose Seasonal PF 12/26/2014, 12/15/2015   Influenza, Quadrivalent, Recombinant, Inj, Pf 01/06/2017, 01/04/2019, 01/11/2020, 01/16/2021, 01/27/2023   Influenza,inj,Quad PF,6+ Mos 12/20/2013   Influenza-Unspecified 01/04/2012, 12/04/2016, 12/05/2018   PFIZER Comirnaty(Gray Top)Covid-19 Tri-Sucrose Vaccine 08/15/2020   PFIZER(Purple Top)SARS-COV-2 Vaccination 04/17/2019, 05/08/2019, 12/03/2019, 12/04/2019   PNEUMOCOCCAL CONJUGATE-20 05/06/2021   Pfizer Covid-19 Vaccine Bivalent Booster 57yrs & up 01/07/2021   Pneumococcal Conjugate-13 02/10/2015   Pneumococcal Polysaccharide-23 11/02/2010, 01/04/2012   Tdap 11/02/2010    Past Medical History:  Diagnosis Date   BMI 40.0-44.9, adult (HCC)    CAD in native artery, stent to RCA BMS with MI 2015 11/04/2003   a. inferior STEMI 01/2014 s/p BMS to prox RCA.   DVT (deep venous thrombosis) (HCC) 2003   lower extremity DVT with questionable recurrence in 2005. Another admission 2015.   Endometrial cancer (HCC) 2021   Endometrial thickening on ultra sound 07/2009   path showing Fragments of benign endocervical and squamous mucosa. No dysplasia or malignancy // Followed by Dr. Deann Exon   First degree AV block    Gastric ulcer  GERD (gastroesophageal reflux disease)    GI bleed    Hemorrhoid    Hyperlipidemia LDL goal <70    Hypertension    Insomnia 11/02/2010   Memory difficulties 11/02/2010   Mild dilation of ascending aorta (HCC)    Morbid obesity (HCC) 09/16/2012   Obstructive sleep apnea 09/23/2010   Pre-diabetes    Pulmonary nodule    ST elevation myocardial infarction (STEMI) of inferior wall (HCC)  01/24/2014   Stroke (HCC) 2015   Syncope 12/18/2013   Syncope, near 12/14/2018   TIA (transient ischemic attack) 09/23/2010   09/13/2010: CT, MRI/MRA no acute process. Carotid doppler: no stenosis. 2D-Echo Normal LV function with an EF 55-60%.    Trifascicular block    UTI (urinary tract infection) 01/27/2014    Tobacco History: Social History   Tobacco Use  Smoking Status Never  Smokeless Tobacco Never   Counseling given: Not Answered   Outpatient Medications Prior to Visit  Medication Sig Dispense Refill   acetaminophen  (TYLENOL ) 500 MG tablet Take 500 mg by mouth 2 (two) times daily as needed (for pain).     albuterol  (PROVENTIL ) (2.5 MG/3ML) 0.083% nebulizer solution Take 3 mLs (2.5 mg total) by nebulization every 4 (four) hours as needed for wheezing or shortness of breath. 375 mL 2   albuterol  (VENTOLIN  HFA) 108 (90 Base) MCG/ACT inhaler INHALE 2 PUFFS INTO THE LUNGS EVERY 4 HOURS AS NEEDED FOR WHEEZING OR SHORTNESS OF BREATH. 18 each 6   amLODipine  (NORVASC ) 10 MG tablet Take 1 tablet (10 mg total) by mouth daily. 90 tablet 3   arformoterol  (BROVANA ) 15 MCG/2ML NEBU Take 2 mLs (15 mcg total) by nebulization 2 (two) times daily. 120 mL 3   atorvastatin  (LIPITOR ) 80 MG tablet TAKE 1 TABLET BY MOUTH EVERY DAY 90 tablet 1   budesonide  (PULMICORT ) 0.5 MG/2ML nebulizer solution Take 2 mLs (0.5 mg total) by nebulization 2 (two) times daily. 120 mL 6   busPIRone  (BUSPAR ) 7.5 MG tablet Take 7.5 mg by mouth 2 (two) times daily.     cetirizine (ZYRTEC) 10 MG tablet Take 10 mg by mouth daily.     cholecalciferol (VITAMIN D3) 25 MCG (1000 UNIT) tablet Take 1,000 Units by mouth daily.     ELIQUIS  2.5 MG TABS tablet TAKE 1 TABLET BY MOUTH TWICE A DAY 60 tablet 5   furosemide  (LASIX ) 40 MG tablet Take 1 tablet (40 mg total) by mouth 2 (two) times daily. 180 tablet 3   hydrALAZINE  (APRESOLINE ) 25 MG tablet Take 1 tablet (25 mg total) by mouth every 6 (six) hours. (Patient taking differently:  Take 25 mg by mouth 2 (two) times daily.) 120 tablet 0   metFORMIN (GLUCOPHAGE-XR) 500 MG 24 hr tablet Take 500 mg by mouth daily with breakfast.     metoprolol  succinate (TOPROL  XL) 50 MG 24 hr tablet Take 1 tablet (50 mg total) by mouth daily. Take with or immediately following a meal. 90 tablet 3   Multiple Vitamin (MULTIVITAMIN WITH MINERALS) TABS Take 1 tablet by mouth every morning.     Naphazoline HCl (CLEAR EYES OP) Place 1 drop into both eyes as needed (for irritation or dryness).      nitroGLYCERIN  (NITROSTAT ) 0.4 MG SL tablet Place 1 tablet (0.4 mg total) under the tongue every 5 (five) minutes x 3 doses as needed for chest pain. 25 tablet 3   omeprazole  (PRILOSEC) 20 MG capsule Take 1 capsule (20 mg total) by mouth every other day. (Patient taking differently:  Take 20 mg by mouth daily.) 45 capsule 3   potassium chloride  SA (KLOR-CON  M) 20 MEQ tablet Take 1 tablet (20 mEq total) by mouth daily. 90 tablet 3   sodium chloride  (OCEAN) 0.65 % SOLN nasal spray Place 1 spray into both nostrils as needed for congestion.     Spacer/Aero-Holding Chambers (AEROCHAMBER MV) inhaler Use as instructed 1 each 0   telmisartan (MICARDIS) 80 MG tablet Take 80 mg by mouth daily.     fluticasone  furoate-vilanterol (BREO ELLIPTA ) 200-25 MCG/ACT AEPB Inhale 1 puff into the lungs daily. (Patient not taking: Reported on 08/18/2023) 60 each 3   No facility-administered medications prior to visit.      Review of Systems  Review of Systems   Physical Exam  BP 124/62 (BP Location: Left Arm, Patient Position: Sitting, Cuff Size: Large)   Pulse 64   Temp 98.4 F (36.9 C) (Oral)   Ht 5\' 3"  (1.6 m)   Wt 244 lb 12.8 oz (111 kg)   SpO2 99%   BMI 43.36 kg/m  Physical Exam   Lab Results:  CBC    Component Value Date/Time   WBC 12.0 (H) 07/22/2023 0456   RBC 4.64 07/22/2023 0456   HGB 13.3 07/22/2023 0456   HGB 13.1 12/20/2022 1035   HGB 13.3 10/17/2019 0914   HGB 12.4 12/13/2014 0857   HCT 41.0  07/22/2023 0456   HCT 41.2 10/17/2019 0914   HCT 37.4 12/13/2014 0857   PLT 235 07/22/2023 0456   PLT 217 12/20/2022 1035   PLT 267 10/17/2019 0914   MCV 88.4 07/22/2023 0456   MCV 86 10/17/2019 0914   MCV 84.2 12/13/2014 0857   MCH 28.7 07/22/2023 0456   MCHC 32.4 07/22/2023 0456   RDW 13.2 07/22/2023 0456   RDW 13.1 10/17/2019 0914   RDW 14.7 (H) 12/13/2014 0857   LYMPHSABS 1.8 07/22/2023 0456   LYMPHSABS 6.3 (H) 12/13/2014 0857   MONOABS 0.6 07/22/2023 0456   MONOABS 0.7 12/13/2014 0857   EOSABS 0.0 07/22/2023 0456   EOSABS 0.4 12/13/2014 0857   BASOSABS 0.0 07/22/2023 0456   BASOSABS 0.1 12/13/2014 0857    BMET    Component Value Date/Time   NA 131 (L) 07/22/2023 0456   NA 141 07/13/2023 0817   NA 137 12/13/2014 0857   K 3.5 07/22/2023 0456   K 3.8 12/13/2014 0857   CL 97 (L) 07/22/2023 0456   CO2 23 07/22/2023 0456   CO2 28 12/13/2014 0857   GLUCOSE 243 (H) 07/22/2023 0456   GLUCOSE 98 12/13/2014 0857   BUN 24 (H) 07/22/2023 0456   BUN 9 07/13/2023 0817   BUN 10.6 12/13/2014 0857   CREATININE 0.49 07/22/2023 0456   CREATININE 0.60 12/20/2022 1035   CREATININE 0.7 12/13/2014 0857   CALCIUM  8.3 (L) 07/22/2023 0456   CALCIUM  8.8 12/13/2014 0857   GFRNONAA >60 07/22/2023 0456   GFRNONAA >60 12/20/2022 1035   GFRAA >60 12/06/2019 0923    BNP    Component Value Date/Time   BNP 198.0 (H) 07/20/2023 0511    ProBNP    Component Value Date/Time   PROBNP 191 09/22/2022 1622   PROBNP 64.6 09/15/2012 2345    Imaging: VAS US  LOWER EXTREMITY VENOUS (DVT) Result Date: 07/20/2023  Lower Venous DVT Study Patient Name:  REGENA REISENAUER DE PAULA  Date of Exam:   07/20/2023 Medical Rec #: [id number]              Accession #:  [id number] Date of Birth: 1940/12/31              Patient Gender: F Patient Age:   51 years Exam Location:  [facility] Procedure:      VAS US  LOWER EXTREMITY VENOUS (DVT) Referring Phys: [facility] GOEL  --------------------------------------------------------------------------------  Indications: Swelling, Edema, SOB, and stroke. Other Indications: H/O DVT, CHF. Comparison Study: Previous study on 12.29.2015 Performing Technologist: Ria Chad  Examination Guidelines: A complete evaluation includes B-mode imaging, spectral Doppler, color Doppler, and power Doppler as needed of all accessible portions of each vessel. Bilateral testing is considered an integral part of a complete examination. Limited examinations for reoccurring indications may be performed as noted. The reflux portion of the exam is performed with the patient in reverse Trendelenburg.  +---------+---------------+---------+-----------+----------+-------------------+ RIGHT    CompressibilityPhasicitySpontaneityPropertiesThrombus Aging      +---------+---------------+---------+-----------+----------+-------------------+ CFV      Full           Yes      Yes                                      +---------+---------------+---------+-----------+----------+-------------------+ SFJ      Full           Yes      Yes                                      +---------+---------------+---------+-----------+----------+-------------------+ FV Prox  Full                                                             +---------+---------------+---------+-----------+----------+-------------------+ FV Mid   Full                                                             +---------+---------------+---------+-----------+----------+-------------------+ FV DistalFull                                                             +---------+---------------+---------+-----------+----------+-------------------+ PFV      Full                                                             +---------+---------------+---------+-----------+----------+-------------------+ POP      Full           Yes      Yes                                       +---------+---------------+---------+-----------+----------+-------------------+ PTV  Not well visualized +---------+---------------+---------+-----------+----------+-------------------+ PERO                                                  Not well visualized +---------+---------------+---------+-----------+----------+-------------------+   +---------+---------------+---------+-----------+----------+-------------------+ LEFT     CompressibilityPhasicitySpontaneityPropertiesThrombus Aging      +---------+---------------+---------+-----------+----------+-------------------+ CFV      Full           Yes      Yes                                      +---------+---------------+---------+-----------+----------+-------------------+ SFJ      Full           Yes      Yes                                      +---------+---------------+---------+-----------+----------+-------------------+ FV Prox  Full                                                             +---------+---------------+---------+-----------+----------+-------------------+ FV Mid   Full           Yes      Yes                                      +---------+---------------+---------+-----------+----------+-------------------+ FV DistalFull           Yes      Yes                                      +---------+---------------+---------+-----------+----------+-------------------+ PFV      Full                                                             +---------+---------------+---------+-----------+----------+-------------------+ POP      Full           Yes      Yes                                      +---------+---------------+---------+-----------+----------+-------------------+ PTV                                                   Not well visualized  +---------+---------------+---------+-----------+----------+-------------------+ PERO  Not well visualized +---------+---------------+---------+-----------+----------+-------------------+     Summary: RIGHT: - There is no evidence of deep vein thrombosis in the lower extremity. However, portions of this examination were limited- see technologist comments above.  - No cystic structure found in the popliteal fossa.  LEFT: - There is no evidence of deep vein thrombosis in the lower extremity. However, portions of this examination were limited- see technologist comments above.  - No cystic structure found in the popliteal fossa.  *See table(s) above for measurements and observations. Electronically signed by Irvin Mantel on 07/20/2023 at 8:05:02 PM.    Final    DG Chest 2 View Result Date: 07/19/2023 CLINICAL DATA:  Shortness of breath. EXAM: CHEST - 2 VIEW COMPARISON:  06/22/2023. FINDINGS: Bilateral lung fields are clear. Bilateral costophrenic angles are clear. Stable cardio-mediastinal silhouette. No acute osseous abnormalities. The soft tissues are within normal limits. IMPRESSION: *No active cardiopulmonary disease. Electronically Signed   By: Beula Brunswick M.D.   On: 07/19/2023 11:55     Assessment & Plan:   No problem-specific Assessment & Plan notes found for this encounter.   1. Hx of sleep apnea (Primary) - Split night study; Future  2. Moderate persistent asthma without complication  Assessment and Plan    Asthma Asthma with mild improvement but recent exacerbation requiring hospitalization. Shortness of breath has improved, occasional cough persists, and wheezing has resolved. - Continue nebulized Brovana  and Pulmicort  - Resume Breo Ellipta  per Dr. Marygrace Snellen last OV   Obstructive Sleep Apnea Long-standing obstructive sleep apnea, not currently using CPAP therapy. Current CPAP machine is outdated and malfunctioning, causing  condensation issues. A new sleep study is necessary to reassess the condition and determine appropriate CPAP settings. Insurance typically covers replacement machines every five years. - Order in-lab split-night sleep study to reestablish diagnosis and titrate CPAP settings - Advise sleeping with head elevated or on side to avoid supine position - Avoid alcohol  consumption before bed - Advise against driving if experiencing fatigue  Peripheral Edema Peripheral edema managed with Lasix . She elevates legs to control swelling.  Coronary Artery Disease Coronary artery disease with previous myocardial infarction and stroke. -Continue Lipitor  80mg  daily  -Continue Eliquis  2.5mg  twice daily     Antonio Baumgarten, NP 08/18/2023

## 2023-08-22 ENCOUNTER — Telehealth: Payer: Self-pay | Admitting: Pulmonary Disease

## 2023-08-22 NOTE — Telephone Encounter (Signed)
 Attempted to let pt know about sleep study scheduled on July 29th   Please resch pts follow up appt with MH as it is currently scheduled prior to her sleep study

## 2023-08-23 NOTE — Telephone Encounter (Signed)
 Patient has been scheduled

## 2023-09-02 ENCOUNTER — Encounter: Payer: Self-pay | Admitting: Pharmacist

## 2023-09-05 ENCOUNTER — Encounter: Payer: Self-pay | Admitting: Primary Care

## 2023-09-05 ENCOUNTER — Telehealth: Payer: Self-pay | Admitting: Primary Care

## 2023-09-05 NOTE — Telephone Encounter (Signed)
 Sleep Study was denied for the following reason  Why was coverage denied? We denied the medical services/items listed above because: After looking at the rules, our doctor has found that your request can't be approved. You have sleep apnea and you use a sleep breathing machine. We don't see proof that your machine isn't working well for you. We need to know if your symptoms are back. Or if the machine isn't helping enough. Without this information we can't approve a new sleep test. Please advise

## 2023-09-05 NOTE — Telephone Encounter (Signed)
 Typically if there is a break in CPAP therapy or no-compliance with use we need to repeat HST in order to get a new machine. I also do not have a copy of their last sleep study.   Can we determine who the patient's current DME company is and get a copy of that sleep study?  If they are denying this then they should be ok with replacement current CPAP machine without a repeat sleep study

## 2023-09-05 NOTE — Telephone Encounter (Signed)
 Sleep Study has been denied for the following reason as this determination was made by [facility] after viewing all of the medical records sent to them.   You have sleep apnea and you use a sleep breathing machine. We don't see proof that your machine isn't  working well for you. We need to know if your symptoms are back. Or if the machine isn't helping  enough. Without this information we can't approve a new sleep test.  Before making our decision, we reached out to your doctor's office. Based on the notes that we were  given your request does not meet the rules. You can talk about this with your doctor.

## 2023-09-06 ENCOUNTER — Ambulatory Visit: Attending: Cardiology | Admitting: Pharmacist

## 2023-09-06 VITALS — BP 118/78 | HR 58

## 2023-09-06 DIAGNOSIS — I10 Essential (primary) hypertension: Secondary | ICD-10-CM | POA: Diagnosis not present

## 2023-09-06 NOTE — Patient Instructions (Addendum)
 Metformin 500mg  twice a day Atorvastatin  80mg  daily  Continue taking amlodipine  10 mg daily, metoprolol  succinate 50 mg daily, telmisartan 80 mg daily, furosemide  40 mg twice a day, hydralazine  25 mg as needed for blood pressure greater than 140/85

## 2023-09-06 NOTE — Assessment & Plan Note (Signed)
 Assessment: Blood pressure well-controlled in clinic today Reports home blood pressure has been well-controlled.  Only had to take hydralazine  1 time-which was last night Most readings in the 120s over 70s Home blood pressure cuff found to be accurate  Plan: Continue amlodipine  10 mg daily, metoprolol  succinate 50 mg daily, telmisartan 80 mg daily, furosemide  40 mg twice a day, hydralazine  25 mg as needed for blood pressure  Follow-up as needed

## 2023-09-06 NOTE — Progress Notes (Signed)
 Patient ID: Shirley [name]                 DOB: [birth year]                      MRN: [id number]      HPI: Shirley [name] is a 83 y.o. female referred by Dr. Abel Hoe to HTN clinic. PMH is significant for CAD with inferior STEMI October 2015, HTN, DVT, prior CVA, prior GI bleeding, chronic diastolic CHF and sleep apnea.   Patient was seen by Dr. Abel Hoe 06/17/23.  Blood pressure was elevated and patient had lower extremity edema.  Furosemide  was increased to 40 mg twice daily and she was recommended to wear compression stockings and elevate her feet.  Her metoprolol  was also increased to 50 mg daily.  She was seen in the ER 07/19/23 for asthma exacerbation.  Hydralazine  25 mg every 6 hours was added.  I saw patient in the office 08/12/23 for elevated blood pressure.  It was discovered that she had stopped taking her telmisartan due to confusion when her hydralazine  was started.  Telmisartan was resumed and she was advised to take hydralazine  as needed for blood pressure greater than 140/85.   Patient presents today accompanied by her daughter.  She brings in her home [employer] blood pressure cuff which was found to be accurate.  She denies any dizziness or lightheadedness.  Occasional headache but did not associated with high blood pressure.  She does describe occasional episodes of flashing light that occurs even when she checks her eyes.  States that she put something cold on her head and lies down it goes away.  It was recommended that she discuss this with her eye doctor.  Happens on and off and cannot note the frequency.  She did bring in her home medications.  Atorvastatin  and metformin were missing.  These 2 medications were written down and daughter is going to follow-up on them.  Looks like they were last filled April 15.  Home OMRON: 121/75 Clinic reading: 118/78  Current HTN meds: Amlodipine  10 mg daily, metoprolol  succinate 50 mg daily, telmisartan 80 mg daily, furosemide  40 mg  twice a day, hydralazine  25 mg as needed for blood pressure greater than 140/85  Previously tried:  BP goal: Less than 130/80  Family History:  Family History  Problem Relation Age of Onset   Diabetes Mother    Hyperlipidemia Mother    Heart disease Mother    Dementia Mother    Angina Father    Heart attack Father    Thyroid  cancer Sister    Liver cancer Sister    Heart disease Brother        x 2 in 94s yo   Prostate cancer Brother 78   Alzheimer's disease Maternal Grandmother    Cirrhosis Daughter    Stroke Neg Hx    Colon cancer Neg Hx    Esophageal cancer Neg Hx    Rectal cancer Neg Hx    Stomach cancer Neg Hx    Ovarian cancer Neg Hx    Endometrial cancer Neg Hx     Social History: Not assessed today  Diet: Not assessed  Exercise: Not addressed  Home BP readings: 120-150/68-80's (few diastolics in the 90s) HR 55-63   Wt Readings from Last 3 Encounters:  08/18/23 244 lb 12.8 oz (111 kg)  08/01/23 244 lb (110.7 kg)  07/22/23 236 lb 5.3 oz (107.2 kg)  BP Readings from Last 3 Encounters:  09/06/23 118/78  08/18/23 124/62  08/12/23 (!) 150/98   Pulse Readings from Last 3 Encounters:  09/06/23 (!) 58  08/18/23 64  08/12/23 83    Renal function: CrCl cannot be calculated (Patient's most recent lab result is older than the maximum 21 days allowed.).  Past Medical History:  Diagnosis Date   BMI 40.0-44.9, adult (HCC)    CAD in native artery, stent to RCA BMS with MI 2015 11/04/2003   a. inferior STEMI 01/2014 s/p BMS to prox RCA.   DVT (deep venous thrombosis) (HCC) 2003   lower extremity DVT with questionable recurrence in 2005. Another admission 2015.   Endometrial cancer (HCC) 2021   Endometrial thickening on ultra sound 07/2009   path showing Fragments of benign endocervical and squamous mucosa. No dysplasia or malignancy // Followed by Dr. Deann Exon   First degree AV block    Gastric ulcer    GERD (gastroesophageal reflux disease)    GI bleed     Hemorrhoid    Hyperlipidemia LDL goal <70    Hypertension    Insomnia 11/02/2010   Memory difficulties 11/02/2010   Mild dilation of ascending aorta (HCC)    Morbid obesity (HCC) 09/16/2012   Obstructive sleep apnea 09/23/2010   Pre-diabetes    Pulmonary nodule    ST elevation myocardial infarction (STEMI) of inferior wall (HCC) 01/24/2014   Stroke (HCC) 2015   Syncope 12/18/2013   Syncope, near 12/14/2018   TIA (transient ischemic attack) 09/23/2010   09/13/2010: CT, MRI/MRA no acute process. Carotid doppler: no stenosis. 2D-Echo Normal LV function with an EF 55-60%.    Trifascicular block    UTI (urinary tract infection) 01/27/2014    Current Outpatient Medications on File Prior to Visit  Medication Sig Dispense Refill   albuterol  (PROVENTIL ) (2.5 MG/3ML) 0.083% nebulizer solution Take 3 mLs (2.5 mg total) by nebulization every 4 (four) hours as needed for wheezing or shortness of breath. 375 mL 2   albuterol  (VENTOLIN  HFA) 108 (90 Base) MCG/ACT inhaler INHALE 2 PUFFS INTO THE LUNGS EVERY 4 HOURS AS NEEDED FOR WHEEZING OR SHORTNESS OF BREATH. 18 each 6   amLODipine  (NORVASC ) 10 MG tablet Take 1 tablet (10 mg total) by mouth daily. 90 tablet 3   arformoterol  (BROVANA ) 15 MCG/2ML NEBU Take 2 mLs (15 mcg total) by nebulization 2 (two) times daily. 120 mL 3   atorvastatin  (LIPITOR ) 80 MG tablet TAKE 1 TABLET BY MOUTH EVERY DAY 90 tablet 1   budesonide  (PULMICORT ) 0.5 MG/2ML nebulizer solution Take 2 mLs (0.5 mg total) by nebulization 2 (two) times daily. 120 mL 6   busPIRone  (BUSPAR ) 7.5 MG tablet Take 7.5 mg by mouth 2 (two) times daily.     cetirizine (ZYRTEC) 10 MG tablet Take 10 mg by mouth daily.     cholecalciferol (VITAMIN D3) 25 MCG (1000 UNIT) tablet Take 1,000 Units by mouth daily.     ELIQUIS  2.5 MG TABS tablet TAKE 1 TABLET BY MOUTH TWICE A DAY 60 tablet 5   fluticasone  furoate-vilanterol (BREO ELLIPTA ) 200-25 MCG/ACT AEPB Inhale 1 puff into the lungs daily. 60 each 3    furosemide  (LASIX ) 40 MG tablet Take 1 tablet (40 mg total) by mouth 2 (two) times daily. 180 tablet 3   hydrALAZINE  (APRESOLINE ) 25 MG tablet Take 1 tablet (25 mg total) by mouth every 6 (six) hours. (Patient taking differently: Take 25 mg by mouth as needed (for BP >140/85).) 120 tablet  0   metFORMIN (GLUCOPHAGE-XR) 500 MG 24 hr tablet Take 500 mg by mouth daily with breakfast.     metoprolol  succinate (TOPROL  XL) 50 MG 24 hr tablet Take 1 tablet (50 mg total) by mouth daily. Take with or immediately following a meal. 90 tablet 3   omeprazole  (PRILOSEC) 20 MG capsule Take 1 capsule (20 mg total) by mouth every other day. (Patient taking differently: Take 20 mg by mouth daily.) 45 capsule 3   potassium chloride  SA (KLOR-CON  M) 20 MEQ tablet Take 1 tablet (20 mEq total) by mouth daily. 90 tablet 3   telmisartan (MICARDIS) 80 MG tablet Take 80 mg by mouth daily.     acetaminophen  (TYLENOL ) 500 MG tablet Take 500 mg by mouth 2 (two) times daily as needed (for pain).     Multiple Vitamin (MULTIVITAMIN WITH MINERALS) TABS Take 1 tablet by mouth every morning.     Naphazoline HCl (CLEAR EYES OP) Place 1 drop into both eyes as needed (for irritation or dryness).      nitroGLYCERIN  (NITROSTAT ) 0.4 MG SL tablet Place 1 tablet (0.4 mg total) under the tongue every 5 (five) minutes x 3 doses as needed for chest pain. 25 tablet 3   sodium chloride  (OCEAN) 0.65 % SOLN nasal spray Place 1 spray into both nostrils as needed for congestion.     Spacer/Aero-Holding Chambers (AEROCHAMBER MV) inhaler Use as instructed 1 each 0   No current facility-administered medications on file prior to visit.    Allergies  Allergen Reactions   Iohexol  Rash and Other (See Comments)    Patient stated a rash appeared after a CT scan    Hydrochlorothiazide  Other (See Comments)    12/18/21: Palpitations and SOB per the pt - AS    Blood pressure 118/78, pulse (!) 58.   Assessment/Plan:      1. Hypertension -    Hypertension Assessment: Blood pressure well-controlled in clinic today Reports home blood pressure has been well-controlled.  Only had to take hydralazine  1 time-which was last night Most readings in the 120s over 70s Home blood pressure cuff found to be accurate  Plan: Continue amlodipine  10 mg daily, metoprolol  succinate 50 mg daily, telmisartan 80 mg daily, furosemide  40 mg twice a day, hydralazine  25 mg as needed for blood pressure  Follow-up as needed    Thank you  Makita Blow D Sima Lindenberger, Pharm.Monika Annas, CPP [facility] of [facility] [address], [place], [place] [address]  Phone: [phone]; Fax: [phone]

## 2023-09-08 NOTE — Telephone Encounter (Signed)
 Adah Acron states the pt has had the current machine maybe 6-7 years and is due for a new one. Adah Acron stated she is not sure who the DME company is but would try and ask the pt is she remembers. Adah Acron will call me back before our office closes today or she will try to reach me tomorrow. Holding note until we know who the DME is.    I tried calling the office of Gaylyn Keas, MD ( sleep medicine, Cardiology) and their office stated they don't have a copy of the sleep study. The order for a split night study was signed by Gaylyn Keas, MD but they had a note placed from a visit note that the pt already had  CPAP machine and was receiving supplies from [employer]. I will trying calling or emailing them to see if they have a copy of the sleep study and see if they can see a compliance report. Holding note until I get a response back

## 2023-09-09 ENCOUNTER — Ambulatory Visit: Admitting: Pharmacist

## 2023-09-10 IMAGING — CR DG CHEST 2V
2 series · 2 of 2 positions shown · non-contrast
Comparison: Chest x-ray 10/05/2019.

CLINICAL DATA: Cough, fluid on lungs.

EXAM:
CHEST - 2 VIEW

[chest pa]
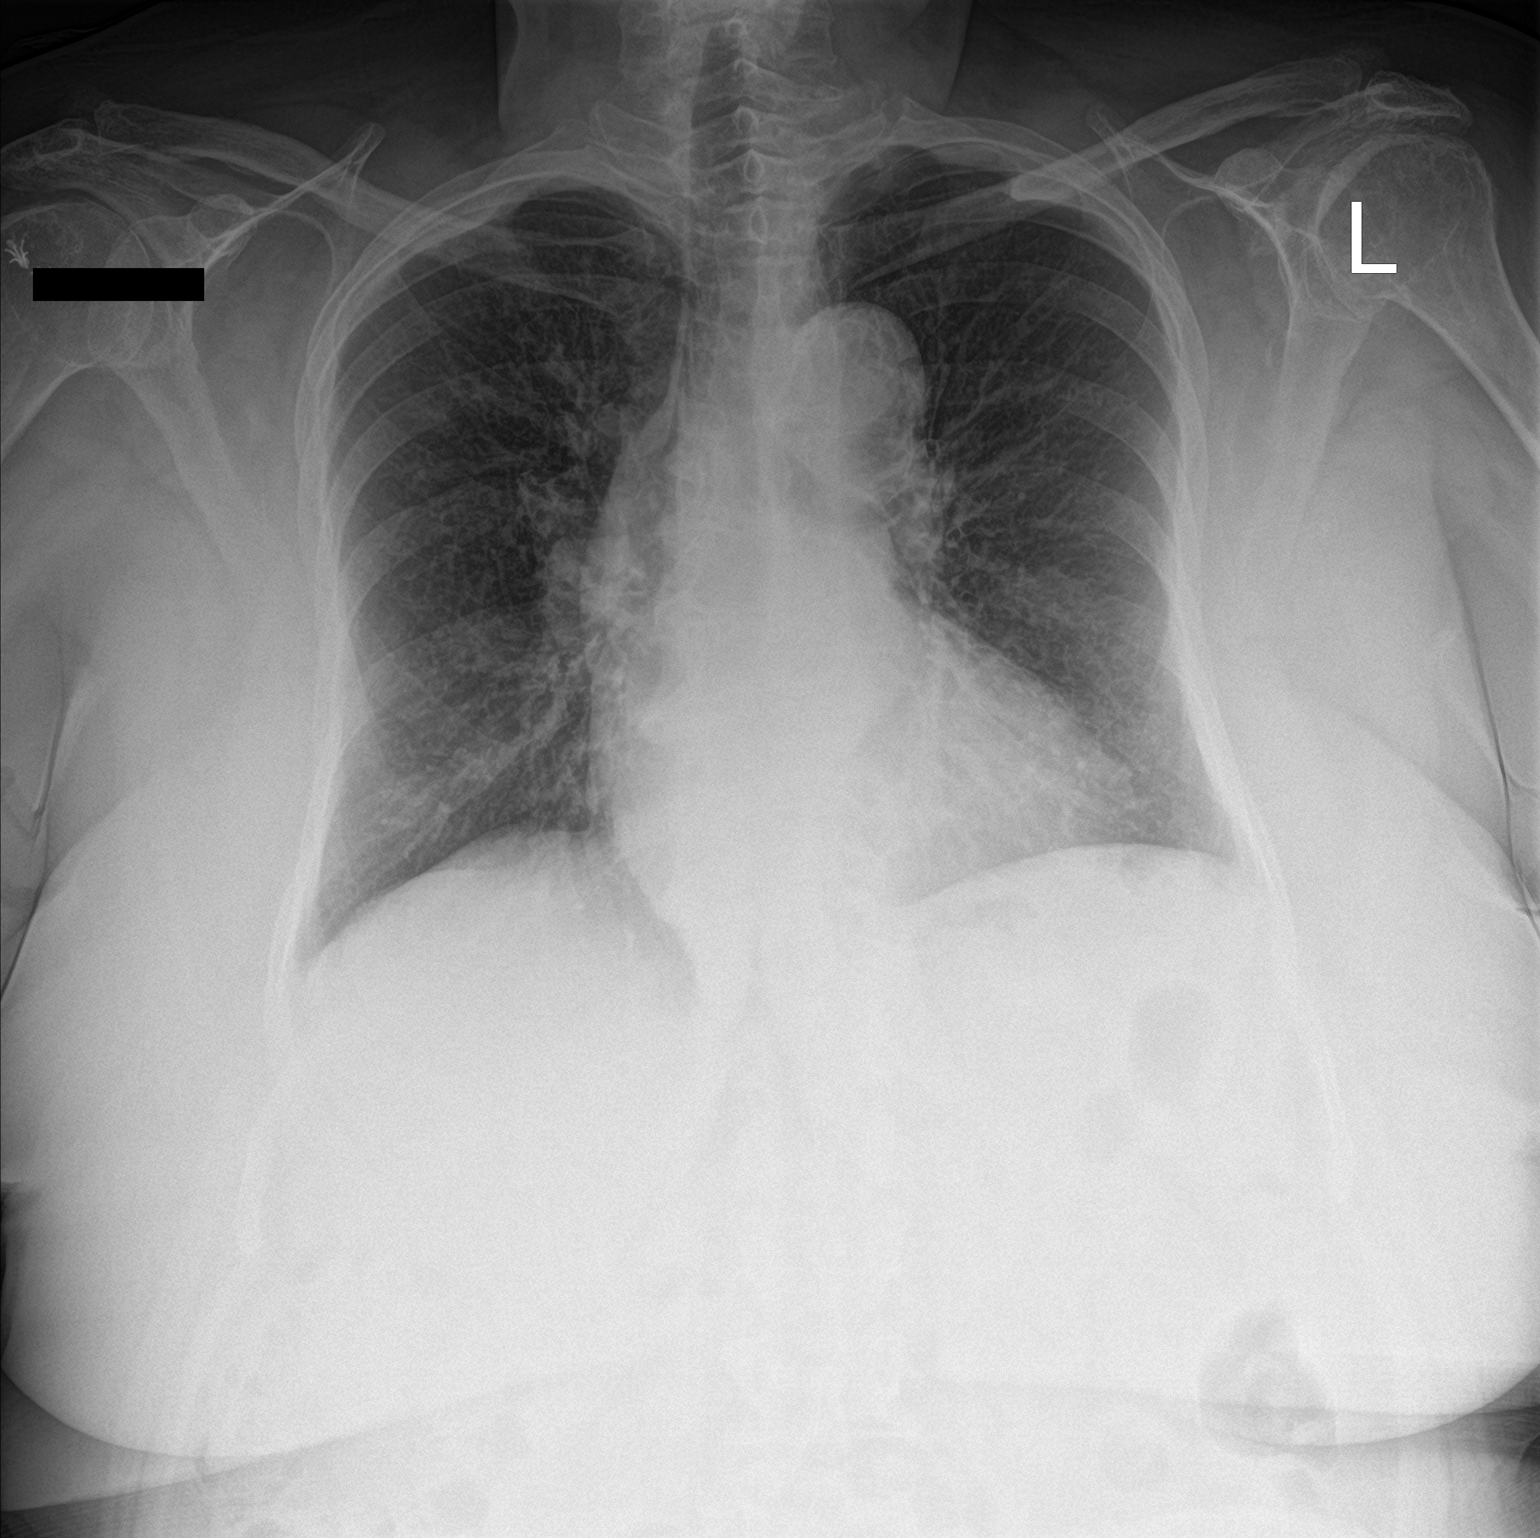

[chest lat]
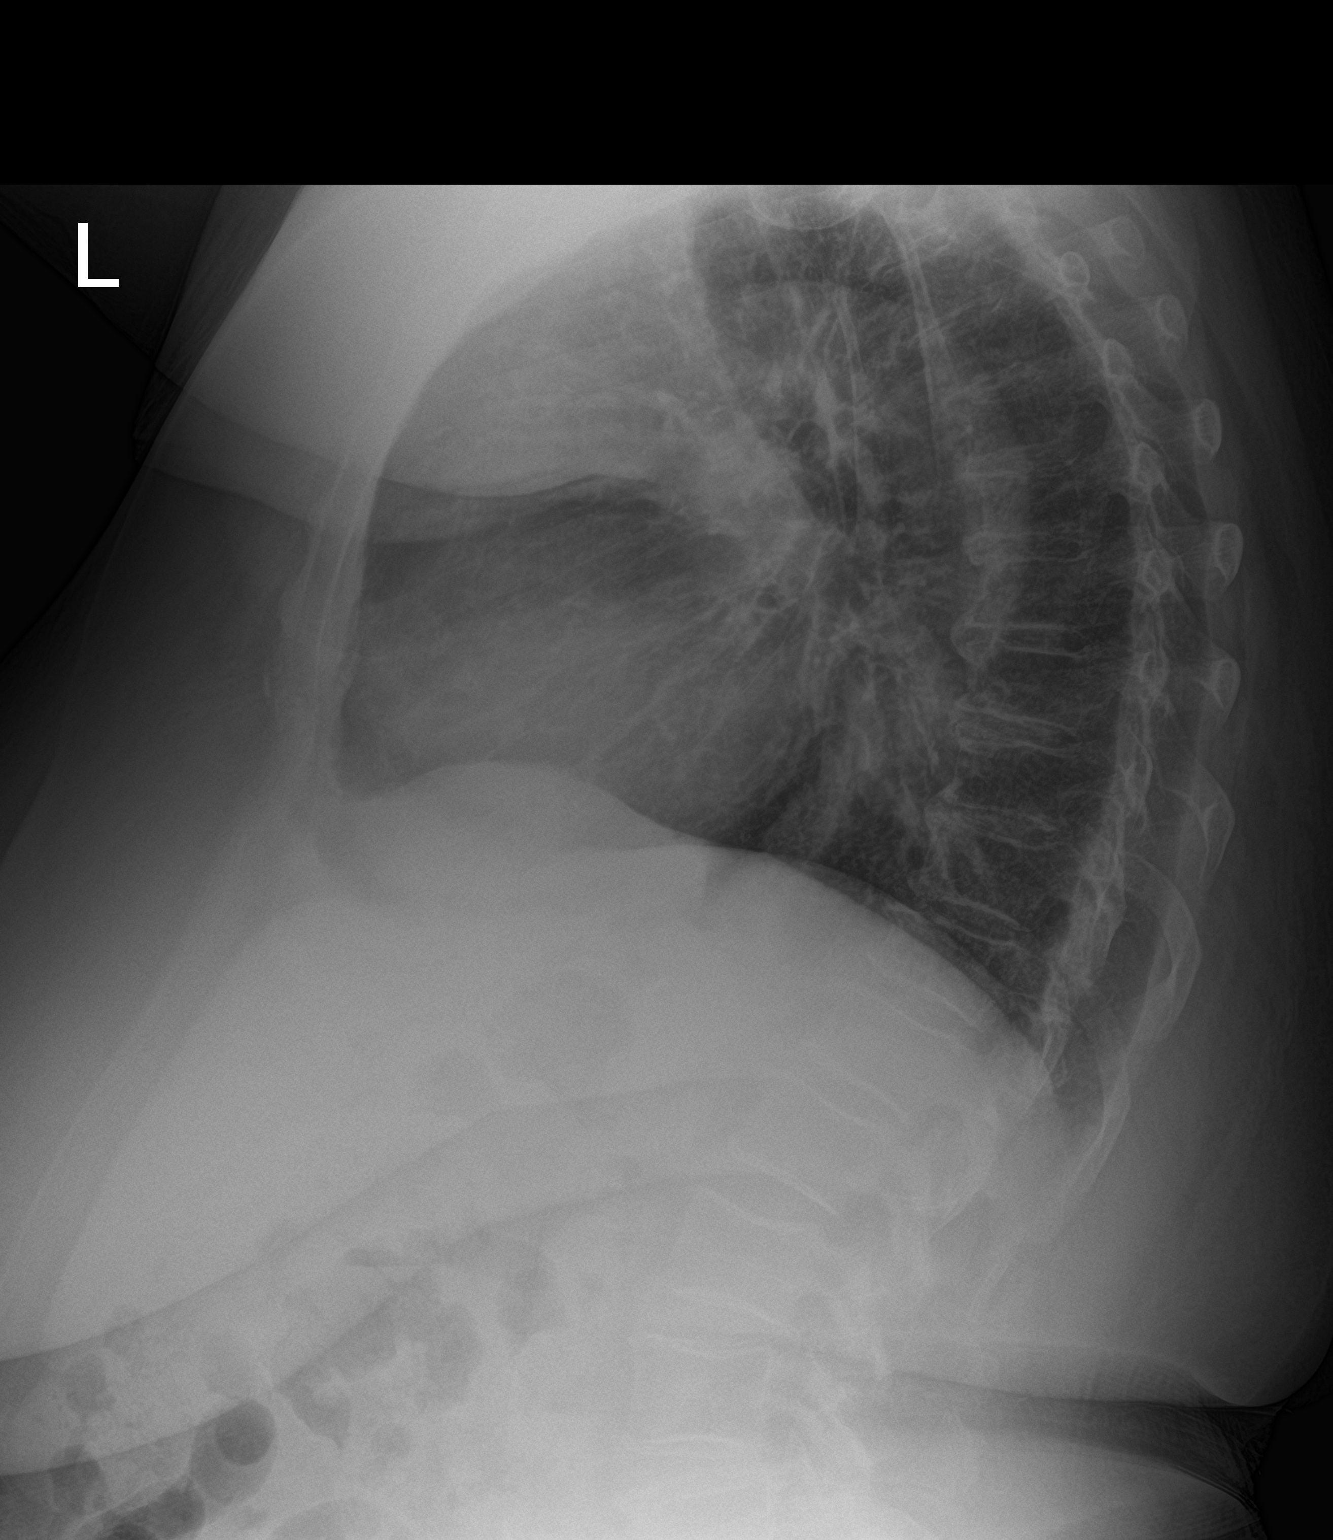

[2 of 2 positions shown; findings below may reference images not displayed]

FINDINGS: The aorta is ectatic, unchanged. Cardiac silhouette is within normal
limits. There is no focal lung infiltrate, pleural effusion or
pneumothorax identified. No acute fractures are seen.
IMPRESSION: No active cardiopulmonary disease.

## 2023-09-16 NOTE — Telephone Encounter (Signed)
 Copy is at [employer]. NFN

## 2023-10-03 ENCOUNTER — Other Ambulatory Visit: Payer: Self-pay | Admitting: Cardiovascular Disease

## 2023-10-04 ENCOUNTER — Ambulatory Visit: Admitting: Pulmonary Disease

## 2023-10-11 ENCOUNTER — Telehealth: Payer: Self-pay

## 2023-10-11 DIAGNOSIS — G4733 Obstructive sleep apnea (adult) (pediatric): Secondary | ICD-10-CM

## 2023-10-11 NOTE — Telephone Encounter (Signed)
 Copied from CRM [id number]. Topic: Clinical - Medical Advice >> Oct 06, 2023  1:20 PM Devaughn RAMAN wrote: Reason for CRM: Patient daughter Montie called regarding CPAP machine for sleep study, Montie stated she was calling to provide Beth the information off of the machine, she did provide the information below, please follow up with Montie regarding machine.  [facility] [address] [place], [place] [address] [phone]

## 2023-10-12 NOTE — Telephone Encounter (Signed)
 Can place an order for new CPAP machine auto settings 5-15cm h20 with mask of choice, FU in 31-90 days for compliance check

## 2023-10-12 NOTE — Telephone Encounter (Signed)
 Sent message to Amy, we can place an order for new CPAP machine and go from there  HST was denied

## 2023-10-12 NOTE — Telephone Encounter (Signed)
 Order for CPAP was placed  Tried calling pt- line rings and then busy tone  Will route to Ashlyn for continued f/u

## 2023-10-12 NOTE — Addendum Note (Signed)
 Addended by: Sherby Moncayo M on: 10/12/2023 11:30 AM   Modules accepted: Orders

## 2023-10-13 NOTE — Telephone Encounter (Signed)
 I called and spoke to pt's daughter, Montie. [facility]) Montie informed of Beth's note and verbalized understanding. NFN

## 2023-10-13 NOTE — Telephone Encounter (Signed)
 I called and spoke to pt's daughter, Montie. [facility]) Montie was informed of Beth's note and verbalized understanding. CPAP was ordered. NFN

## 2023-10-20 ENCOUNTER — Ambulatory Visit: Admitting: Pharmacist

## 2023-11-01 ENCOUNTER — Encounter (HOSPITAL_BASED_OUTPATIENT_CLINIC_OR_DEPARTMENT_OTHER): Admitting: Pulmonary Disease

## 2023-11-09 ENCOUNTER — Ambulatory Visit: Admitting: Primary Care

## 2023-11-09 NOTE — Procedures (Signed)
 Error

## 2023-12-12 ENCOUNTER — Telehealth: Payer: Self-pay

## 2023-12-12 NOTE — Telephone Encounter (Signed)
 Paper received from [employer] [facility] needing a signature from Almarie Ferrari, NP regarding the CPAP supplies for machine. The provider can sign this and I will fax when she is back in office tomorrow, 12-13-23.

## 2023-12-14 ENCOUNTER — Encounter: Payer: Self-pay | Admitting: Pulmonary Disease

## 2023-12-14 ENCOUNTER — Ambulatory Visit: Admitting: Pulmonary Disease

## 2023-12-14 ENCOUNTER — Ambulatory Visit: Admitting: Primary Care

## 2023-12-14 VITALS — BP 108/62 | HR 68 | Ht 63.0 in | Wt 243.0 lb

## 2023-12-14 DIAGNOSIS — R0609 Other forms of dyspnea: Secondary | ICD-10-CM | POA: Diagnosis not present

## 2023-12-14 DIAGNOSIS — G4733 Obstructive sleep apnea (adult) (pediatric): Secondary | ICD-10-CM | POA: Diagnosis not present

## 2023-12-14 DIAGNOSIS — J45909 Unspecified asthma, uncomplicated: Secondary | ICD-10-CM

## 2023-12-14 DIAGNOSIS — J4540 Moderate persistent asthma, uncomplicated: Secondary | ICD-10-CM

## 2023-12-14 MED ORDER — BUDESONIDE 0.5 MG/2ML IN SUSP: 0.5000 mg | Freq: Two times a day (BID) | RESPIRATORY_TRACT | 12 refills | Status: AC

## 2023-12-14 MED ORDER — ALBUTEROL SULFATE (2.5 MG/3ML) 0.083% IN NEBU: 2.5000 mg | INHALATION_SOLUTION | RESPIRATORY_TRACT | 2 refills | Status: AC | PRN

## 2023-12-14 MED ORDER — ARFORMOTEROL TARTRATE 15 MCG/2ML IN NEBU: 15.0000 ug | INHALATION_SOLUTION | Freq: Two times a day (BID) | RESPIRATORY_TRACT | 12 refills | Status: AC

## 2023-12-14 MED ORDER — FLUTICASONE FUROATE-VILANTEROL 200-25 MCG/ACT IN AEPB: 1.0000 | INHALATION_SPRAY | Freq: Every day | RESPIRATORY_TRACT | 12 refills | Status: AC

## 2023-12-14 NOTE — Patient Instructions (Signed)
 I refilled the nebulizers as well as the Breo  I am glad you are doing well  I will look into the CPAP issue, hopefully we will have that resolved by this week-once you get the CPAP machine you will need to schedule appoint with myself or Landry Ferrari, NP within 31 to 90 days of receiving and starting using the machine.  Return to clinic in 6 months or sooner as needed with Dr. Annella

## 2023-12-14 NOTE — Telephone Encounter (Signed)
 Just following up to see if CPAP orders have been sent.  Thank you!

## 2023-12-14 NOTE — Progress Notes (Signed)
 @Patient  ID: Shirley [name], female    DOB: June 01, 1940, 83 y.o.   MRN: [id number]  Chief Complaint  Patient presents with   Medical Management of Chronic Issues    Referring provider: Verdia Lombard, MD  HPI:   83 y.o. woman whom are seen in follow up  for evaluation of wheeze, mild dyspnea on exertion felt to be related to asthma given historic temporary improvement with ICS and prednisone .  Most recent pulmonary note Landry Ferrari, NP reviewed.    Returns for routine follow-up.  Doing much better in terms of cough dyspnea.  On ICS/LABA nebulizer as well as Breo.  Had appointment with Landry Ferrari in the interim.  Diagnosed sleep apnea.  Awaiting CPAP.  There has been a quite a delay in this.  HPI at initial visit: Patient was in usual health.  Developed ear pain, sore throat, runny nose, cough, chest tightness and wheeze.  This was April 2023.  Got some antibiotics.  Mild improvement.  Seen by PCP.  Given prednisone  and additional antibiotics.  Prednisone  helped immensely.  Sore throat, ear pain, cough largely improved.  Mild residual cough.  Wheezes main thing that persist.  No time of day when things are better or worse.  No position make things better or worse.  No seasonal environmental factors she can identify to make things better or worse.  Does not use albuterol  afraid of how will make her feel.  Prescribe budesonide  nebulized 0.25 mg twice daily.  Feels this does help a bit.  Helps with decreasing the frequency of wheeze.  She denies significant allergy symptoms except for the spring.  No recurrent bronchitis.  No prior history of asthma.  No other respiratory illness.  She had chest x-ray 08/2021 that on my review interpretation reveals clear lungs bilaterally.  Most recent cross-sectional imaging CTA chest 10/2019 reviewed interpreted as clear lungs bilaterally.  PMH: OSA on CPAP hypertension, Surgical history: Tubal ligation, total hysterectomy and oophorectomy, D&C of  uterus, rotator cuff repair Family history: Mother with CAD, hyperlipidemia, diabetes, Father with CAD Social history: Never smoker, lives in [place] / Pulmonary Flowsheets:   ACT:  Asthma Control Test ACT Total Score  08/18/2023 11:31 AM 17  06/22/2023 11:09 AM 17    MMRC:     No data to display          Epworth:      No data to display          Tests:   FENO:  No results found for: NITRICOXIDE  PFT:    Latest Ref Rng & Units 03/08/2022    8:54 AM  PFT Results  FVC-Pre L 1.93   FVC-Predicted Pre % 87   FVC-Post L 2.00   FVC-Predicted Post % 90   Pre FEV1/FVC % % 77   Post FEV1/FCV % % 84   FEV1-Pre L 1.49   FEV1-Predicted Pre % 91   FEV1-Post L 1.67   DLCO uncorrected ml/min/mmHg 13.40   DLCO UNC% % 79   DLCO corrected ml/min/mmHg 13.40   DLCO COR %Predicted % 79   DLVA Predicted % 91   TLC L 3.67   TLC % Predicted % 79   RV % Predicted % 73   Percent.  Normal spirometry, no significant vasodilators found, 1 lines of mild restriction, DLCO within normal limits  WALK:      No data to display        Personally reviewed  interpret as normal spirometry, no significant bronchodilator response.  Lung volumes within normal notes.  DLCO within normal limits.  Imaging: Personally reviewed and as per EMR discussion this note No results found.   Lab Results: Personally reviewed CBC    Component Value Date/Time   WBC 12.0 (H) 07/22/2023 0456   RBC 4.64 07/22/2023 0456   HGB 13.3 07/22/2023 0456   HGB 13.1 12/20/2022 1035   HGB 13.3 10/17/2019 0914   HGB 12.4 12/13/2014 0857   HCT 41.0 07/22/2023 0456   HCT 41.2 10/17/2019 0914   HCT 37.4 12/13/2014 0857   PLT 235 07/22/2023 0456   PLT 217 12/20/2022 1035   PLT 267 10/17/2019 0914   MCV 88.4 07/22/2023 0456   MCV 86 10/17/2019 0914   MCV 84.2 12/13/2014 0857   MCH 28.7 07/22/2023 0456   MCHC 32.4 07/22/2023 0456   RDW 13.2 07/22/2023 0456   RDW 13.1 10/17/2019 0914    RDW 14.7 (H) 12/13/2014 0857   LYMPHSABS 1.8 07/22/2023 0456   LYMPHSABS 6.3 (H) 12/13/2014 0857   MONOABS 0.6 07/22/2023 0456   MONOABS 0.7 12/13/2014 0857   EOSABS 0.0 07/22/2023 0456   EOSABS 0.4 12/13/2014 0857   BASOSABS 0.0 07/22/2023 0456   BASOSABS 0.1 12/13/2014 0857    BMET    Component Value Date/Time   NA 131 (L) 07/22/2023 0456   NA 141 07/13/2023 0817   NA 137 12/13/2014 0857   K 3.5 07/22/2023 0456   K 3.8 12/13/2014 0857   CL 97 (L) 07/22/2023 0456   CO2 23 07/22/2023 0456   CO2 28 12/13/2014 0857   GLUCOSE 243 (H) 07/22/2023 0456   GLUCOSE 98 12/13/2014 0857   BUN 24 (H) 07/22/2023 0456   BUN 9 07/13/2023 0817   BUN 10.6 12/13/2014 0857   CREATININE 0.49 07/22/2023 0456   CREATININE 0.60 12/20/2022 1035   CREATININE 0.7 12/13/2014 0857   CALCIUM  8.3 (L) 07/22/2023 0456   CALCIUM  8.8 12/13/2014 0857   GFRNONAA >60 07/22/2023 0456   GFRNONAA >60 12/20/2022 1035   GFRAA >60 12/06/2019 0923    BNP    Component Value Date/Time   BNP 198.0 (H) 07/20/2023 0511    ProBNP    Component Value Date/Time   PROBNP 191 09/22/2022 1622   PROBNP 64.6 09/15/2012 2345    Specialty Problems       Pulmonary Problems   Obstructive sleep apnea   Asthma exacerbation    Allergies  Allergen Reactions   Iohexol  Rash and Other (See Comments)    Patient stated a rash appeared after a CT scan    Hydrochlorothiazide  Other (See Comments)    12/18/21: Palpitations and SOB per the pt - AS    Immunization History  Administered Date(s) Administered   INFLUENZA, HIGH DOSE SEASONAL PF 12/26/2014, 12/15/2015   Influenza, Quadrivalent, Recombinant, Inj, Pf 01/06/2017, 01/04/2019, 01/11/2020, 01/16/2021, 01/27/2023   Influenza,inj,Quad PF,6+ Mos 12/20/2013   Influenza-Unspecified 01/04/2012, 12/04/2016, 12/05/2018   PFIZER Comirnaty(Gray Top)Covid-19 Tri-Sucrose Vaccine 08/15/2020   PFIZER(Purple Top)SARS-COV-2 Vaccination 04/17/2019, 05/08/2019, 12/03/2019,  12/04/2019   PNEUMOCOCCAL CONJUGATE-20 05/06/2021   Pfizer Covid-19 Vaccine Bivalent Booster 48yrs & up 01/07/2021   Pneumococcal Conjugate-13 02/10/2015   Pneumococcal Polysaccharide-23 11/02/2010, 01/04/2012   Tdap 11/02/2010    Past Medical History:  Diagnosis Date   BMI 40.0-44.9, adult (HCC)    CAD in native artery, stent to RCA BMS with MI 2015 11/04/2003   a. inferior STEMI 01/2014 s/p BMS to prox RCA.  DVT (deep venous thrombosis) (HCC) 2003   lower extremity DVT with questionable recurrence in 2005. Another admission 2015.   Endometrial cancer (HCC) 2021   Endometrial thickening on ultra sound 07/2009   path showing Fragments of benign endocervical and squamous mucosa. No dysplasia or malignancy // Followed by Dr. Peggye Gull   First degree AV block    Gastric ulcer    GERD (gastroesophageal reflux disease)    GI bleed    Hemorrhoid    Hyperlipidemia LDL goal <70    Hypertension    Insomnia 11/02/2010   Memory difficulties 11/02/2010   Mild dilation of ascending aorta (HCC)    Morbid obesity (HCC) 09/16/2012   Obstructive sleep apnea 09/23/2010   Pre-diabetes    Pulmonary nodule    ST elevation myocardial infarction (STEMI) of inferior wall (HCC) 01/24/2014   Stroke (HCC) 2015   Syncope 12/18/2013   Syncope, near 12/14/2018   TIA (transient ischemic attack) 09/23/2010   09/13/2010: CT, MRI/MRA no acute process. Carotid doppler: no stenosis. 2D-Echo Normal LV function with an EF 55-60%.    Trifascicular block    UTI (urinary tract infection) 01/27/2014    Tobacco History: Social History   Tobacco Use  Smoking Status Never  Smokeless Tobacco Never   Counseling given: Not Answered   Continue to not smoke  Outpatient Encounter Medications as of 12/14/2023  Medication Sig   acetaminophen  (TYLENOL ) 500 MG tablet Take 500 mg by mouth 2 (two) times daily as needed (for pain).   albuterol  (VENTOLIN  HFA) 108 (90 Base) MCG/ACT inhaler INHALE 2 PUFFS INTO THE LUNGS  EVERY 4 HOURS AS NEEDED FOR WHEEZING OR SHORTNESS OF BREATH.   amLODipine  (NORVASC ) 10 MG tablet Take 1 tablet (10 mg total) by mouth daily.   atorvastatin  (LIPITOR ) 80 MG tablet TAKE 1 TABLET BY MOUTH EVERY DAY   busPIRone  (BUSPAR ) 7.5 MG tablet Take 7.5 mg by mouth 2 (two) times daily.   cetirizine (ZYRTEC) 10 MG tablet Take 10 mg by mouth daily.   cholecalciferol (VITAMIN D3) 25 MCG (1000 UNIT) tablet Take 1,000 Units by mouth daily.   ELIQUIS  2.5 MG TABS tablet TAKE 1 TABLET BY MOUTH TWICE A DAY   furosemide  (LASIX ) 40 MG tablet Take 1 tablet (40 mg total) by mouth 2 (two) times daily.   hydrALAZINE  (APRESOLINE ) 25 MG tablet Take 1 tablet (25 mg total) by mouth every 6 (six) hours. (Patient taking differently: Take 25 mg by mouth as needed (for BP >140/85).)   metFORMIN (GLUCOPHAGE-XR) 500 MG 24 hr tablet Take 500 mg by mouth daily with breakfast.   metoprolol  succinate (TOPROL  XL) 50 MG 24 hr tablet Take 1 tablet (50 mg total) by mouth daily. Take with or immediately following a meal.   Multiple Vitamin (MULTIVITAMIN WITH MINERALS) TABS Take 1 tablet by mouth every morning.   Naphazoline HCl (CLEAR EYES OP) Place 1 drop into both eyes as needed (for irritation or dryness).    nitroGLYCERIN  (NITROSTAT ) 0.4 MG SL tablet Place 1 tablet (0.4 mg total) under the tongue every 5 (five) minutes x 3 doses as needed for chest pain.   omeprazole  (PRILOSEC) 20 MG capsule Take 1 capsule (20 mg total) by mouth every other day. (Patient taking differently: Take 20 mg by mouth daily.)   potassium chloride  SA (KLOR-CON  M) 20 MEQ tablet Take 1 tablet (20 mEq total) by mouth daily.   sodium chloride  (OCEAN) 0.65 % SOLN nasal spray Place 1 spray into both nostrils as needed  for congestion.   Spacer/Aero-Holding Chambers (AEROCHAMBER MV) inhaler Use as instructed   telmisartan (MICARDIS) 80 MG tablet Take 80 mg by mouth daily.   [DISCONTINUED] albuterol  (PROVENTIL ) (2.5 MG/3ML) 0.083% nebulizer solution Take 3  mLs (2.5 mg total) by nebulization every 4 (four) hours as needed for wheezing or shortness of breath.   [DISCONTINUED] arformoterol  (BROVANA ) 15 MCG/2ML NEBU Take 2 mLs (15 mcg total) by nebulization 2 (two) times daily.   [DISCONTINUED] budesonide  (PULMICORT ) 0.5 MG/2ML nebulizer solution Take 2 mLs (0.5 mg total) by nebulization 2 (two) times daily.   [DISCONTINUED] fluticasone  furoate-vilanterol (BREO ELLIPTA ) 200-25 MCG/ACT AEPB Inhale 1 puff into the lungs daily.   albuterol  (PROVENTIL ) (2.5 MG/3ML) 0.083% nebulizer solution Take 3 mLs (2.5 mg total) by nebulization every 4 (four) hours as needed for wheezing or shortness of breath.   arformoterol  (BROVANA ) 15 MCG/2ML NEBU Take 2 mLs (15 mcg total) by nebulization 2 (two) times daily.   budesonide  (PULMICORT ) 0.5 MG/2ML nebulizer solution Take 2 mLs (0.5 mg total) by nebulization 2 (two) times daily.   fluticasone  furoate-vilanterol (BREO ELLIPTA ) 200-25 MCG/ACT AEPB Inhale 1 puff into the lungs daily.   No facility-administered encounter medications on file as of 12/14/2023.     Review of Systems  Review of Systems  N/a Physical Exam  BP 108/62   Pulse 68   Ht 5' 3 (1.6 m)   Wt 243 lb (110.2 kg)   SpO2 96%   BMI 43.05 kg/m   Wt Readings from Last 5 Encounters:  12/14/23 243 lb (110.2 kg)  08/18/23 244 lb 12.8 oz (111 kg)  08/01/23 244 lb (110.7 kg)  07/22/23 236 lb 5.3 oz (107.2 kg)  07/06/23 245 lb 2 oz (111.2 kg)    BMI Readings from Last 5 Encounters:  12/14/23 43.05 kg/m  08/18/23 43.36 kg/m  08/01/23 43.22 kg/m  07/22/23 41.86 kg/m  07/06/23 43.42 kg/m     Physical Exam General: Well-appearing, no acute distress Eyes: EOMI, no icterus Neck: Supple, no JVP Pulmonary: Clear,NWOB Cardiovascular: Warm MSK: No synovitis, no joint effusion Abdomen: Nondistended Neuro: Normal gait, no weakness Psych: Normal mood, full affect   Assessment & Plan:   Dyspnea on exertion: Overall improved with asthma  therapies.  Escalated as below given cough and wheeze.  PFTs performed 03/2022 are normal.  Cough, wheeze due to asthma with recent exacerbation: Suspect related to asthma, triggered after viral illness 05/2023.  Prolonged exacerbation culminating in hospitalization 07/2023.  Mild improvement ICS/LABA but then worsened prompting hospitalization 07/2023.  Overall improved with nebulized ICS LABA therapy as well as Breo.  Refilled today.  Albuterol  nebulizer refilled today as well.  OSA: Recent diagnosis.  Yet to receive CPAP.  Will inquire about this, send messages to staff.  Return in about 6 months (around 06/12/2024) for f/u Dr. Annella.   Shirley JONELLE Annella, MD 12/14/2023

## 2023-12-16 NOTE — Telephone Encounter (Signed)
 I signed everything in my in-box and didn't see this particular order, can we call DME and make sure they have order - otherwise we need that to resend CMN

## 2023-12-16 NOTE — Telephone Encounter (Signed)
 This has been faxed today.

## 2023-12-26 DIAGNOSIS — J4540 Moderate persistent asthma, uncomplicated: Secondary | ICD-10-CM | POA: Diagnosis not present

## 2023-12-26 DIAGNOSIS — N182 Chronic kidney disease, stage 2 (mild): Secondary | ICD-10-CM | POA: Diagnosis not present

## 2023-12-26 DIAGNOSIS — D6869 Other thrombophilia: Secondary | ICD-10-CM | POA: Diagnosis not present

## 2023-12-26 DIAGNOSIS — E1142 Type 2 diabetes mellitus with diabetic polyneuropathy: Secondary | ICD-10-CM | POA: Diagnosis not present

## 2023-12-26 DIAGNOSIS — I10 Essential (primary) hypertension: Secondary | ICD-10-CM | POA: Diagnosis not present

## 2023-12-26 DIAGNOSIS — D692 Other nonthrombocytopenic purpura: Secondary | ICD-10-CM | POA: Diagnosis not present

## 2023-12-26 DIAGNOSIS — E782 Mixed hyperlipidemia: Secondary | ICD-10-CM | POA: Diagnosis not present

## 2023-12-26 DIAGNOSIS — D6859 Other primary thrombophilia: Secondary | ICD-10-CM | POA: Diagnosis not present

## 2024-01-02 DIAGNOSIS — E1142 Type 2 diabetes mellitus with diabetic polyneuropathy: Secondary | ICD-10-CM | POA: Diagnosis not present

## 2024-01-02 DIAGNOSIS — R600 Localized edema: Secondary | ICD-10-CM | POA: Diagnosis not present

## 2024-01-02 DIAGNOSIS — D692 Other nonthrombocytopenic purpura: Secondary | ICD-10-CM | POA: Diagnosis not present

## 2024-01-02 DIAGNOSIS — I10 Essential (primary) hypertension: Secondary | ICD-10-CM | POA: Diagnosis not present

## 2024-01-02 DIAGNOSIS — N182 Chronic kidney disease, stage 2 (mild): Secondary | ICD-10-CM | POA: Diagnosis not present

## 2024-01-02 DIAGNOSIS — E782 Mixed hyperlipidemia: Secondary | ICD-10-CM | POA: Diagnosis not present

## 2024-01-02 DIAGNOSIS — D6869 Other thrombophilia: Secondary | ICD-10-CM | POA: Diagnosis not present

## 2024-01-02 DIAGNOSIS — J4540 Moderate persistent asthma, uncomplicated: Secondary | ICD-10-CM | POA: Diagnosis not present

## 2024-01-06 DIAGNOSIS — Z23 Encounter for immunization: Secondary | ICD-10-CM | POA: Diagnosis not present

## 2024-01-09 ENCOUNTER — Telehealth: Payer: Self-pay

## 2024-01-09 NOTE — Telephone Encounter (Signed)
 Copied from CRM #[id number]. Topic: Clinical - Order For Equipment >> Jan 04, 2024  3:53 PM Joesph PARAS wrote: Reason for CRM: Patient's daughter is calling to request information. Patient's daughter would like a nurse to call her and go over the situation with her CPAP, including what is next and from where, as she believes a scam number has been contacting patient regarding cpap as well. Please call and update daughter.     Trie to reach out to patient and daughter VM/ LM to return call.    (Please reach out to DME where your Cpap is from  [facility]  Phone-[phone]).     -NFN

## 2024-01-13 ENCOUNTER — Other Ambulatory Visit: Payer: Self-pay | Admitting: Hematology

## 2024-01-26 NOTE — Telephone Encounter (Signed)
 duplicate

## 2024-01-30 ENCOUNTER — Other Ambulatory Visit (HOSPITAL_BASED_OUTPATIENT_CLINIC_OR_DEPARTMENT_OTHER): Payer: Self-pay

## 2024-01-30 MED ORDER — COMIRNATY 30 MCG/0.3ML IM SUSY
0.3000 mL | PREFILLED_SYRINGE | Freq: Once | INTRAMUSCULAR | 0 refills | Status: AC
  Filled 2024-01-30: qty 0.3, 1d supply, fill #0

## 2024-02-23 DIAGNOSIS — Z1231 Encounter for screening mammogram for malignant neoplasm of breast: Secondary | ICD-10-CM | POA: Diagnosis not present

## 2024-03-27 ENCOUNTER — Ambulatory Visit: Admitting: Primary Care

## 2024-03-27 ENCOUNTER — Emergency Department (HOSPITAL_BASED_OUTPATIENT_CLINIC_OR_DEPARTMENT_OTHER): Admitting: Radiology

## 2024-03-27 ENCOUNTER — Ambulatory Visit: Payer: Self-pay | Admitting: Pulmonary Disease

## 2024-03-27 ENCOUNTER — Emergency Department (HOSPITAL_BASED_OUTPATIENT_CLINIC_OR_DEPARTMENT_OTHER)
Admission: EM | Admit: 2024-03-27 | Discharge: 2024-03-27 | Disposition: A | Discharge status: Home / Self Care | Source: Ambulatory Visit | Attending: Emergency Medicine | Admitting: Emergency Medicine

## 2024-03-27 ENCOUNTER — Other Ambulatory Visit: Payer: Self-pay

## 2024-03-27 DIAGNOSIS — R600 Localized edema: Secondary | ICD-10-CM | POA: Diagnosis not present

## 2024-03-27 DIAGNOSIS — Z7951 Long term (current) use of inhaled steroids: Secondary | ICD-10-CM | POA: Diagnosis not present

## 2024-03-27 DIAGNOSIS — I10 Essential (primary) hypertension: Secondary | ICD-10-CM | POA: Diagnosis not present

## 2024-03-27 DIAGNOSIS — Z7901 Long term (current) use of anticoagulants: Secondary | ICD-10-CM | POA: Diagnosis not present

## 2024-03-27 DIAGNOSIS — J4541 Moderate persistent asthma with (acute) exacerbation: Secondary | ICD-10-CM | POA: Insufficient documentation

## 2024-03-27 DIAGNOSIS — J101 Influenza due to other identified influenza virus with other respiratory manifestations: Secondary | ICD-10-CM | POA: Insufficient documentation

## 2024-03-27 DIAGNOSIS — R059 Cough, unspecified: Secondary | ICD-10-CM | POA: Diagnosis present

## 2024-03-27 DIAGNOSIS — Z79899 Other long term (current) drug therapy: Secondary | ICD-10-CM | POA: Diagnosis not present

## 2024-03-27 DIAGNOSIS — Z8673 Personal history of transient ischemic attack (TIA), and cerebral infarction without residual deficits: Secondary | ICD-10-CM | POA: Diagnosis not present

## 2024-03-27 LAB — BASIC METABOLIC PANEL WITH GFR
Anion gap: 12 (ref 5–15)
BUN: 13 mg/dL (ref 8–23)
CO2: 22 mmol/L (ref 22–32)
Calcium: 8.9 mg/dL (ref 8.9–10.3)
Chloride: 98 mmol/L (ref 98–111)
Creatinine, Ser: 0.75 mg/dL (ref 0.44–1.00)
GFR, Estimated: >60 mL/min
Glucose, Bld: 153 mg/dL — ABNORMAL HIGH (ref 70–99)
Potassium: 3.8 mmol/L (ref 3.5–5.1)
Sodium: 132 mmol/L — ABNORMAL LOW (ref 135–145)

## 2024-03-27 LAB — CBC
HCT: 40.7 % (ref 36.0–46.0)
Hemoglobin: 13.2 g/dL (ref 12.0–15.0)
MCH: 28 pg (ref 26.0–34.0)
MCHC: 32.4 g/dL (ref 30.0–36.0)
MCV: 86.2 fL (ref 80.0–100.0)
Platelets: 160 K/uL (ref 150–400)
RBC: 4.72 MIL/uL (ref 3.87–5.11)
RDW: 13.8 % (ref 11.5–15.5)
WBC: 6.8 K/uL (ref 4.0–10.5)
nRBC: 0 % (ref 0.0–0.2)

## 2024-03-27 LAB — TROPONIN T, HIGH SENSITIVITY
Troponin T High Sensitivity: 28 ng/L — ABNORMAL HIGH (ref 0–19)
Troponin T High Sensitivity: 28 ng/L — ABNORMAL HIGH (ref 0–19)

## 2024-03-27 LAB — RESP PANEL BY RT-PCR (RSV, FLU A&B, COVID)  RVPGX2
Influenza A by PCR: POSITIVE — AB
Influenza B by PCR: NEGATIVE
Resp Syncytial Virus by PCR: NEGATIVE
SARS Coronavirus 2 by RT PCR: NEGATIVE

## 2024-03-27 LAB — PRO BRAIN NATRIURETIC PEPTIDE: Pro Brain Natriuretic Peptide: 577 pg/mL — ABNORMAL HIGH

## 2024-03-27 LAB — LACTIC ACID, PLASMA: Lactic Acid, Venous: 1.9 mmol/L (ref 0.5–1.9)

## 2024-03-27 MED ORDER — SODIUM CHLORIDE 0.9 % IV BOLUS
500.0000 mL | Freq: Once | INTRAVENOUS | Status: AC
  Administered 2024-03-27: 500 mL via INTRAVENOUS

## 2024-03-27 MED ORDER — IPRATROPIUM-ALBUTEROL 0.5-2.5 (3) MG/3ML IN SOLN
3.0000 mL | Freq: Once | RESPIRATORY_TRACT | Status: AC
  Administered 2024-03-27: 3 mL via RESPIRATORY_TRACT
  Filled 2024-03-27: qty 3

## 2024-03-27 MED ORDER — METHYLPREDNISOLONE 4 MG PO TBPK: ORAL_TABLET | ORAL | 0 refills | Status: AC

## 2024-03-27 MED ORDER — OSELTAMIVIR PHOSPHATE 75 MG PO CAPS: 75.0000 mg | ORAL_CAPSULE | Freq: Two times a day (BID) | ORAL | 0 refills | Status: AC

## 2024-03-27 MED ORDER — ACETAMINOPHEN 325 MG PO TABS
650.0000 mg | ORAL_TABLET | Freq: Once | ORAL | Status: AC | PRN
  Administered 2024-03-27: 650 mg via ORAL
  Filled 2024-03-27: qty 2

## 2024-03-27 MED ORDER — METHYLPREDNISOLONE SODIUM SUCC 125 MG IJ SOLR
125.0000 mg | Freq: Once | INTRAMUSCULAR | Status: AC
  Administered 2024-03-27: 125 mg via INTRAVENOUS
  Filled 2024-03-27: qty 2

## 2024-03-27 NOTE — Telephone Encounter (Signed)
 FYI Only or Action Required?: FYI only for provider: ED advised.  Patient is followed in Pulmonology for asthma, last seen on 12/14/2023 by Hunsucker, Donnice SAUNDERS, MD.  Called Nurse Triage reporting Cough.  Symptoms began [name] week ago.  Interventions attempted: Rescue inhaler, Maintenance inhaler, and Nebulizer treatments.  Symptoms are: gradually worsening.  Triage Disposition: Go to ED Now (Notify PCP)  Patient/caregiver understands and will follow disposition?: Yes   Copied from CRM [id number]. Topic: Clinical - Red Word Triage >> Mar 27, 2024 12:50 PM Shirley [name] wrote: Red Word that prompted transfer to Nurse Triage: Daughter states pt has been wheezing and coughing since Thursday. Covid & flu test came back negative. Reason for Disposition  [1] MODERATE difficulty breathing (e.g., speaks in phrases, SOB even at rest, pulse 100-120) AND [2] still present when not coughing  Answer Assessment - Initial Assessment Questions Dtr states pt had cough and congestion that started W/Th last week. She would like someone to listen to her because last time this started she ended up with fluid around her lungs. Dtr states she did [name] test for flu and covid and it was negative. Increased nebs to 3x [name] day. Rn asked about grayness, dtr initially stated yes. Then went and checked on patient and had her to finger probe and stated her lips are her normal skin color and can scratch that off.  O2 96%. Pt is having increased shortness of breath, even at rest per dtr. Denies any chest pain. Initially Rn recommended appt but then realized the shortness of breath was at rest and given her heart rate, the best place of care of her is the Er. RN did call dtr back and advise her of this. She stated understanding and that she would take her to ER now. RN cancelled appt and pt is headed to ER.     1. ONSET: When did the cough begin?      Wednesday/Thursday 2. SEVERITY: How bad is the cough today?     mod 3. SPUTUM:  Describe the color of your sputum (e.g., none, dry cough; clear, white, yellow, green)     none 4. HEMOPTYSIS: Are you coughing up any blood? If Yes, ask: How much? (e.g., flecks, streaks, tablespoons, etc.)     none 5. DIFFICULTY BREATHING: Are you having difficulty breathing? If Yes, ask: How bad is it? (e.g., mild, moderate, severe)      Moderate - dtr states shortness of breath even at rest 6. FEVER: Do you have [name] fever? If Yes, ask: What is your temperature, how was it measured, and when did it start?     denies 7. CARDIAC HISTORY: Do you have any history of heart disease? (e.g., heart attack, congestive heart failure)      CAD 8. LUNG HISTORY: Do you have any history of lung disease?  (e.g., pulmonary embolus, asthma, emphysema)     asthma 10. OTHER SYMPTOMS: Do you have any other symptoms? (e.g., runny nose, wheezing, chest pain)       Shortness of breath, increasing nebulizer treatments  Protocols used: Cough - Acute Non-Productive-[name]-AH

## 2024-03-27 NOTE — ED Triage Notes (Signed)
 Pt c/o SOB since last Wednesday. Congestion, nonproductive cough, chills. Had appt with pulmonology and was told to go to ED for s/s. Some relief with albuterol  neb at home. Lung sounds clear and equal in triage with SpO2 WNL.

## 2024-03-27 NOTE — ED Provider Notes (Signed)
 " [place] EMERGENCY DEPARTMENT AT [facility] Provider Note   CSN: [id number] Arrival date & time: 03/27/24  1356    Patient presents with: Shortness of Breath   Shirley [name] is a 83 y.o. female history of hyper tension, DVT on anticoagulation, CVA, MI, asthma here for evaluation of cough and shortness of breath.  Symptoms started last Thursday.  Fever started on Sunday.  Took a COVID and flu test at home which was negative.  Has had increasing productive cough and wheezing.  Using nebs at home 3 times daily.  No chest pain, back pain, abdominal pain.  No sore throat.  Has associated congestion and rhinorrhea.  No known sick contacts.  Has not been on oral steroids in months according to family in room.  Last admission for asthma April 2025.  Has chronic lower extremity swelling which is unchanged.   Daughter in room states patient's symptoms actually started Friday evening.    HPI     Prior to Admission medications  Medication Sig Start Date End Date Taking? Authorizing Provider  methylPREDNISolone  (MEDROL  DOSEPAK) 4 MG TBPK tablet Take as prescribed on the box 03/27/24  Yes Deshun Sedivy A, PA-C  oseltamivir  (TAMIFLU ) 75 MG capsule Take 1 capsule (75 mg total) by mouth every 12 (twelve) hours. 03/27/24  Yes Winda Summerall A, PA-C  acetaminophen  (TYLENOL ) 500 MG tablet Take 500 mg by mouth 2 (two) times daily as needed (for pain).    [provider]  albuterol  (PROVENTIL ) (2.5 MG/3ML) 0.083% nebulizer solution Take 3 mLs (2.5 mg total) by nebulization every 4 (four) hours as needed for wheezing or shortness of breath. 12/14/23   Hunsucker, Donnice SAUNDERS, MD  albuterol  (VENTOLIN  HFA) 108 (90 Base) MCG/ACT inhaler INHALE 2 PUFFS INTO THE LUNGS EVERY 4 HOURS AS NEEDED FOR WHEEZING OR SHORTNESS OF BREATH. 07/28/22   Hunsucker, Donnice SAUNDERS, MD  amLODipine  (NORVASC ) 10 MG tablet Take 1 tablet (10 mg total) by mouth daily. 08/13/23   Vicci Rollo SAUNDERS, PA-C   arformoterol  (BROVANA ) 15 MCG/2ML NEBU Take 2 mLs (15 mcg total) by nebulization 2 (two) times daily. 12/14/23   Hunsucker, Donnice SAUNDERS, MD  atorvastatin  (LIPITOR ) 80 MG tablet TAKE 1 TABLET BY MOUTH EVERY DAY 09/15/21   Verlin Lonni JONETTA, MD  budesonide  (PULMICORT ) 0.5 MG/2ML nebulizer solution Take 2 mLs (0.5 mg total) by nebulization 2 (two) times daily. 12/14/23   Hunsucker, Donnice SAUNDERS, MD  busPIRone  (BUSPAR ) 7.5 MG tablet Take 7.5 mg by mouth 2 (two) times daily. 09/21/20   [provider]  cetirizine (ZYRTEC) 10 MG tablet Take 10 mg by mouth daily. 12/19/17   [provider]  cholecalciferol (VITAMIN D3) 25 MCG (1000 UNIT) tablet Take 1,000 Units by mouth daily.    [provider]  ELIQUIS  2.5 MG TABS tablet TAKE 1 TABLET BY MOUTH TWICE A DAY 01/13/24   Lanny Callander, MD  fluticasone  furoate-vilanterol (BREO ELLIPTA ) 200-25 MCG/ACT AEPB Inhale 1 puff into the lungs daily. 12/14/23   Hunsucker, Donnice SAUNDERS, MD  furosemide  (LASIX ) 40 MG tablet Take 1 tablet (40 mg total) by mouth 2 (two) times daily. 08/12/23   Verlin Lonni JONETTA, MD  hydrALAZINE  (APRESOLINE ) 25 MG tablet Take 1 tablet (25 mg total) by mouth every 6 (six) hours. Patient taking differently: Take 25 mg by mouth as needed (for BP >140/85). 07/22/23   Swayze, Ava, DO  metFORMIN (GLUCOPHAGE-XR) 500 MG 24 hr tablet Take 500 mg by mouth daily with breakfast.  [provider]  metoprolol  succinate (TOPROL  XL) 50 MG 24 hr tablet Take 1 tablet (50 mg total) by mouth daily. Take with or immediately following a meal. 06/17/23   Verlin Lonni BIRCH, MD  Multiple Vitamin (MULTIVITAMIN WITH MINERALS) TABS Take 1 tablet by mouth every morning.    [provider]  Naphazoline HCl (CLEAR EYES OP) Place 1 drop into both eyes as needed (for irritation or dryness).     [provider]  nitroGLYCERIN  (NITROSTAT ) 0.4 MG SL tablet Place 1 tablet (0.4 mg total) under the tongue every 5 (five) minutes x  3 doses as needed for chest pain. 10/08/22   Wyn Jackee VEAR Mickey., NP  omeprazole  (PRILOSEC) 20 MG capsule Take 1 capsule (20 mg total) by mouth every other day. Patient taking differently: Take 20 mg by mouth daily. 08/20/20   Legrand Victory LITTIE DOUGLAS, MD  potassium chloride  SA (KLOR-CON  M) 20 MEQ tablet Take 1 tablet (20 mEq total) by mouth daily. 06/17/23   Verlin Lonni BIRCH, MD  sodium chloride  (OCEAN) 0.65 % SOLN nasal spray Place 1 spray into both nostrils as needed for congestion.    [provider]  Spacer/Aero-Holding Chambers (AEROCHAMBER MV) inhaler Use as instructed 12/24/21   Hunsucker, Donnice SAUNDERS, MD  telmisartan (MICARDIS) 80 MG tablet Take 80 mg by mouth daily. 02/24/21   [provider]    Allergies: Iohexol  and Hydrochlorothiazide     Review of Systems  Constitutional:  Positive for activity change, appetite change, fatigue and fever.  HENT:  Positive for congestion, postnasal drip and rhinorrhea. Negative for sore throat, trouble swallowing and voice change.   Respiratory:  Positive for cough, shortness of breath and wheezing.   Cardiovascular: Negative.   Gastrointestinal: Negative.   Genitourinary: Negative.   Musculoskeletal: Negative.   Skin: Negative.   Neurological: Negative.   All other systems reviewed and are negative.   Updated Vital Signs BP 119/67 (BP Location: Right Arm)   Pulse 85   Temp 99.4 F (37.4 C) (Oral)   Resp 18   SpO2 96%   Physical Exam Vitals and nursing note reviewed.  Constitutional:      General: She is not in acute distress.    Appearance: She is well-developed. She is not ill-appearing.  HENT:     Head: Normocephalic and atraumatic.  Eyes:     Pupils: Pupils are equal, round, and reactive to light.  Cardiovascular:     Rate and Rhythm: Tachycardia present.     Heart sounds: Normal heart sounds.  Pulmonary:     Effort: No respiratory distress.     Breath sounds: Wheezing present.     Comments: Expiratory wheeze,  speaks in full sentences Chest:     Comments: Mild tenderness anterior chest wall Abdominal:     General: Bowel sounds are normal. There is no distension.     Palpations: Abdomen is soft.     Comments: Soft, nontender.  No rebound or guarding  Musculoskeletal:        General: Normal range of motion.     Cervical back: Normal range of motion.     Right lower leg: No tenderness. Edema present.     Left lower leg: No tenderness. Edema present.     Comments: No bony tenderness, compartments soft.  Pitting edema to mid shins bilaterally  Skin:    General: Skin is warm and dry.     Capillary Refill: Capillary refill takes less than 2 seconds.  Neurological:  General: No focal deficit present.     Mental Status: She is alert.  Psychiatric:        Mood and Affect: Mood normal.     (all labs ordered are listed, but only abnormal results are displayed) Labs Reviewed  RESP PANEL BY RT-PCR (RSV, FLU A&B, COVID)  RVPGX2 - Abnormal; Notable for the following components:      Result Value   Influenza A by PCR POSITIVE (*)    All other components within normal limits  BASIC METABOLIC PANEL WITH GFR - Abnormal; Notable for the following components:   Sodium 132 (*)    Glucose, Bld 153 (*)    All other components within normal limits  PRO BRAIN NATRIURETIC PEPTIDE - Abnormal; Notable for the following components:   Pro Brain Natriuretic Peptide 577.0 (*)    All other components within normal limits  TROPONIN T, HIGH SENSITIVITY - Abnormal; Notable for the following components:   Troponin T High Sensitivity 28 (*)    All other components within normal limits  TROPONIN T, HIGH SENSITIVITY - Abnormal; Notable for the following components:   Troponin T High Sensitivity 28 (*)    All other components within normal limits  CULTURE, BLOOD (ROUTINE X 2)  CULTURE, BLOOD (ROUTINE X 2)  CBC  LACTIC ACID, PLASMA    EKG: EKG Interpretation Date/Time:  Tuesday March 27 2024 14:16:28  EST Ventricular Rate:  134 PR Interval:    QRS Duration:  142 QT Interval:  314 QTC Calculation: 468 R Axis:   -62  Text Interpretation: Wide QRS tachycardia Right bundle branch block Left anterior fascicular block Bifascicular block Possible Lateral infarct , age undetermined Abnormal ECG When compared with ECG of 19-Jul-2023 11:20, PREVIOUS ECG IS PRESENT Confirmed by Pamella Sharper [phone]) on 03/27/2024 5:05:19 PM  Radiology: [facility] Chest Port 1 View Result Date: 03/27/2024 CLINICAL DATA:  Shortness of breath with congestion, nonproductive cough and chills. EXAM: PORTABLE CHEST 1 VIEW COMPARISON:  July 19, 2023 FINDINGS: The heart size and mediastinal contours are within normal limits. There is mild calcification of the aortic arch. No acute infiltrate, pleural effusion or pneumothorax is identified. Postoperative changes are seen involving the right shoulder. Multilevel degenerative changes are present throughout the thoracic spine. IMPRESSION: No active cardiopulmonary disease. Electronically Signed   By: Suzen Dials M.D.   On: 03/27/2024 16:52     Procedures   Medications Ordered in the ED  acetaminophen  (TYLENOL ) tablet 650 mg (650 mg Oral Given 03/27/24 1427)  methylPREDNISolone  sodium succinate (SOLU-MEDROL ) 125 mg/2 mL injection 125 mg (125 mg Intravenous Given 03/27/24 1526)  ipratropium-albuterol  (DUONEB) 0.5-2.5 (3) MG/3ML nebulizer solution 3 mL (3 mLs Nebulization Given 03/27/24 1446)  sodium chloride  0.9 % bolus 500 mL (0 mLs Intravenous Stopped 03/27/24 1802)    83 yo with multiple medical comorbidities here for evaluation of cough, shortness of breath and chills.  Symptoms started about 5 days ago.  Developed fever on Sunday.  Using albuterol  nebs at home.  She has mild expiratory wheeze.  She is febrile, tachycardic however not hypoxic.  She has an lower extremity edema to shins bilaterally however family states that is baseline.  She denies any missed doses of her  anticoagulation.  No chest pain.  Plan on labs, imaging, reassess.  Labs and imaging personally viewed interpreted:  CBC without leukocytosis Metabolic panel sodium 132-baseline for patient Lactic 1.9 Trop 28--28 BNP 577 Flu a positive EKG tachycardia X-ray of that cardiomegaly, fluid overload, infiltrate  Patient reassessed.  Received a breathing treatment, steroids, small bolus IV fluids.  Suspect symptoms likely due to fluid as well as asthma exacerbation.  She was ambulatory here without any hypoxia, significant apnea.  Did initially have some tachycardia however improved.  Low suspicion for PE, bacterial infectious process.  Patient would like to go home.  Family did state that her symptoms started 5 days ago, would like Tamiflu .  Will DC home on Tamiflu , Medrol  Dosepak, DuoNebs at home.  Will have her follow-up outpatient, return for any worsening symptoms.  The patient has been appropriately medically screened and/or stabilized in the ED. I have low suspicion for any other emergent medical condition which would require further screening, evaluation or treatment in the ED or require inpatient management.  Patient is hemodynamically stable and in no acute distress.  Patient able to ambulate in department prior to ED.  Evaluation does not show acute pathology that would require ongoing or additional emergent interventions while in the emergency department or further inpatient treatment.  I have discussed the diagnosis with the patient and answered all questions.  Pain is been managed while in the emergency department and patient has no further complaints prior to discharge.  Patient is comfortable with plan discussed in room and is stable for discharge at this time.  I have discussed strict return precautions for returning to the emergency department.  Patient was encouraged to follow-up with PCP/specialist refer to at discharge.                                   Medical Decision  Making Amount and/or Complexity of Data Reviewed Independent Historian:     Details: Family in room External Data Reviewed: labs, radiology, ECG and notes. Labs: ordered. Decision-making details documented in ED Course. Radiology: ordered and independent interpretation performed. Decision-making details documented in ED Course. ECG/medicine tests: ordered and independent interpretation performed. Decision-making details documented in ED Course.  Risk OTC drugs. Prescription drug management. Decision regarding hospitalization. Diagnosis or treatment significantly limited by social determinants of health.       Final diagnoses:  Influenza A  Moderate persistent asthma with exacerbation    ED Discharge Orders          Ordered    methylPREDNISolone  (MEDROL  DOSEPAK) 4 MG TBPK tablet        03/27/24 1759    oseltamivir  (TAMIFLU ) 75 MG capsule  Every 12 hours        03/27/24 1759               Noni Stonesifer A, PA-C 03/27/24 2122    Dasie Faden, MD 03/30/24 1212  "

## 2024-03-27 NOTE — Discharge Instructions (Addendum)
 It was a pleasure taking care of you here today  Your flu test was positive this is likely the source of your symptoms.  I am starting you on Tamiflu  as well as prednisone .  Use your nebulizers at home every 3-4 hours as needed  Keep close follow-up with your primary care provider  Return for new or worsening symptoms

## 2024-03-27 NOTE — ED Notes (Signed)
 ED Provider at bedside.

## 2024-03-27 NOTE — ED Notes (Signed)
 DC paperwork given and verbally understood.

## 2024-03-27 NOTE — Telephone Encounter (Signed)
 Noted.  Patient went to [facility] ED.  Nothing further needed.

## 2024-03-27 NOTE — ED Notes (Signed)
 Swab obtained in Triage.

## 2024-04-01 LAB — CULTURE, BLOOD (ROUTINE X 2)
Culture: NO GROWTH
Culture: NO GROWTH
Special Requests: ADEQUATE
Special Requests: ADEQUATE

## 2024-04-10 ENCOUNTER — Ambulatory Visit: Admitting: Pulmonary Disease

## 2024-04-10 ENCOUNTER — Telehealth: Payer: Self-pay

## 2024-04-10 ENCOUNTER — Encounter: Payer: Self-pay | Admitting: Pulmonary Disease

## 2024-04-10 VITALS — BP 142/83 | HR 72 | Temp 98.0°F | Resp 18 | Ht 64.0 in | Wt 246.0 lb

## 2024-04-10 DIAGNOSIS — J111 Influenza due to unidentified influenza virus with other respiratory manifestations: Secondary | ICD-10-CM | POA: Diagnosis not present

## 2024-04-10 DIAGNOSIS — J45909 Unspecified asthma, uncomplicated: Secondary | ICD-10-CM | POA: Diagnosis not present

## 2024-04-10 DIAGNOSIS — R059 Cough, unspecified: Secondary | ICD-10-CM | POA: Diagnosis not present

## 2024-04-10 DIAGNOSIS — R0609 Other forms of dyspnea: Secondary | ICD-10-CM

## 2024-04-10 DIAGNOSIS — J45901 Unspecified asthma with (acute) exacerbation: Secondary | ICD-10-CM

## 2024-04-10 NOTE — Patient Instructions (Signed)
 Nice to see you again  I am sorry about the flu but am glad you are recovering well  Please do the nebulizer treatments arformoterol  and budesonide  twice a day every day, morning and evening.  Use Breo 1 puff or 1 inhalation once a day in the morning  You can use albuterol  nebulized or via the inhaler every 4-6 hours as needed for wheezing, shortness of breath or congestion  Return to clinic in 3 months or sooner as needed with Dr. Annella

## 2024-04-10 NOTE — Telephone Encounter (Signed)
 Tried to reach out to   [employer]  For Cpap compliance report

## 2024-04-10 NOTE — Progress Notes (Signed)
 "  @Patient  ID: Shirley [name], female    DOB: [birth year], 84 y.o.   MRN: [id number]  Chief Complaint  Patient presents with   Follow-up    SOB     Referring provider: Verdia Lombard, MD  HPI:   84 y.o. woman whom are seen in follow up  for evaluation of wheeze, mild dyspnea on exertion felt to be related to asthma given historic temporary improvement with ICS and prednisone .  Recent ED provider note reviewed.  Here for hospital follow-up.  Unfortunately began feeling ill just before Christmas.  Cough, shortness of breath.  Presented to ED 12/23.  Tested positive for flu.  Chest x-ray obtained at that time on my review interpretation reveals clear lungs bilaterally.  Prescribed Tamiflu  and prednisone .  This helped.  Wheezing better.  Shortness of breath better.  Congestion better.  She feels fatigued.  Moving around a lot more short of breath.  She is increasing her exercise tolerance at home.  We reiterated and reviewed her home breathing treatment, inhaler regimen.  HPI at initial visit: Patient was in usual health.  Developed ear pain, sore throat, runny nose, cough, chest tightness and wheeze.  This was April 2023.  Got some antibiotics.  Mild improvement.  Seen by PCP.  Given prednisone  and additional antibiotics.  Prednisone  helped immensely.  Sore throat, ear pain, cough largely improved.  Mild residual cough.  Wheezes main thing that persist.  No time of day when things are better or worse.  No position make things better or worse.  No seasonal environmental factors she can identify to make things better or worse.  Does not use albuterol  afraid of how will make her feel.  Prescribe budesonide  nebulized 0.25 mg twice daily.  Feels this does help a bit.  Helps with decreasing the frequency of wheeze.  She denies significant allergy symptoms except for the spring.  No recurrent bronchitis.  No prior history of asthma.  No other respiratory illness.  She had chest x-ray 08/2021 that  on my review interpretation reveals clear lungs bilaterally.  Most recent cross-sectional imaging CTA chest 10/2019 reviewed interpreted as clear lungs bilaterally.  PMH: OSA on CPAP hypertension, Surgical history: Tubal ligation, total hysterectomy and oophorectomy, D&C of uterus, rotator cuff repair Family history: Mother with CAD, hyperlipidemia, diabetes, Father with CAD Social history: Never smoker, lives in [place] / Pulmonary Flowsheets:   ACT:  Asthma Control Test ACT Total Score  08/18/2023 11:31 AM 17  06/22/2023 11:09 AM 17    MMRC:     No data to display          Epworth:      No data to display          Tests:   FENO:  No results found for: NITRICOXIDE  PFT:    Latest Ref Rng & Units 03/08/2022    8:54 AM  PFT Results  FVC-Pre L 1.93   FVC-Predicted Pre % 87   FVC-Post L 2.00   FVC-Predicted Post % 90   Pre FEV1/FVC % % 77   Post FEV1/FCV % % 84   FEV1-Pre L 1.49   FEV1-Predicted Pre % 91   FEV1-Post L 1.67   DLCO uncorrected ml/min/mmHg 13.40   DLCO UNC% % 79   DLCO corrected ml/min/mmHg 13.40   DLCO COR %Predicted % 79   DLVA Predicted % 91   TLC L 3.67   TLC % Predicted % 79  RV % Predicted % 73   Percent.  Normal spirometry, no significant vasodilators found, 1 lines of mild restriction, DLCO within normal limits  WALK:      No data to display        Personally reviewed interpret as normal spirometry, no significant bronchodilator response.  Lung volumes within normal notes.  DLCO within normal limits.  Imaging: Personally reviewed and as per EMR discussion this note DG Chest Port 1 View Result Date: 03/27/2024 CLINICAL DATA:  Shortness of breath with congestion, nonproductive cough and chills. EXAM: PORTABLE CHEST 1 VIEW COMPARISON:  July 19, 2023 FINDINGS: The heart size and mediastinal contours are within normal limits. There is mild calcification of the aortic arch. No acute infiltrate, pleural  effusion or pneumothorax is identified. Postoperative changes are seen involving the right shoulder. Multilevel degenerative changes are present throughout the thoracic spine. IMPRESSION: No active cardiopulmonary disease. Electronically Signed   By: Suzen Dials M.D.   On: 03/27/2024 16:52     Lab Results: Personally reviewed CBC    Component Value Date/Time   WBC 6.8 03/27/2024 1439   RBC 4.72 03/27/2024 1439   HGB 13.2 03/27/2024 1439   HGB 13.1 12/20/2022 1035   HGB 13.3 10/17/2019 0914   HGB 12.4 12/13/2014 0857   HCT 40.7 03/27/2024 1439   HCT 41.2 10/17/2019 0914   HCT 37.4 12/13/2014 0857   PLT 160 03/27/2024 1439   PLT 217 12/20/2022 1035   PLT 267 10/17/2019 0914   MCV 86.2 03/27/2024 1439   MCV 86 10/17/2019 0914   MCV 84.2 12/13/2014 0857   MCH 28.0 03/27/2024 1439   MCHC 32.4 03/27/2024 1439   RDW 13.8 03/27/2024 1439   RDW 13.1 10/17/2019 0914   RDW 14.7 (H) 12/13/2014 0857   LYMPHSABS 1.8 07/22/2023 0456   LYMPHSABS 6.3 (H) 12/13/2014 0857   MONOABS 0.6 07/22/2023 0456   MONOABS 0.7 12/13/2014 0857   EOSABS 0.0 07/22/2023 0456   EOSABS 0.4 12/13/2014 0857   BASOSABS 0.0 07/22/2023 0456   BASOSABS 0.1 12/13/2014 0857    BMET    Component Value Date/Time   NA 132 (L) 03/27/2024 1439   NA 141 07/13/2023 0817   NA 137 12/13/2014 0857   K 3.8 03/27/2024 1439   K 3.8 12/13/2014 0857   CL 98 03/27/2024 1439   CO2 22 03/27/2024 1439   CO2 28 12/13/2014 0857   GLUCOSE 153 (H) 03/27/2024 1439   GLUCOSE 98 12/13/2014 0857   BUN 13 03/27/2024 1439   BUN 9 07/13/2023 0817   BUN 10.6 12/13/2014 0857   CREATININE 0.75 03/27/2024 1439   CREATININE 0.60 12/20/2022 1035   CREATININE 0.7 12/13/2014 0857   CALCIUM  8.9 03/27/2024 1439   CALCIUM  8.8 12/13/2014 0857   GFRNONAA >60 03/27/2024 1439   GFRNONAA >60 12/20/2022 1035   GFRAA >60 12/06/2019 0923    BNP    Component Value Date/Time   BNP 198.0 (H) 07/20/2023 0511    ProBNP    Component  Value Date/Time   PROBNP 577.0 (H) 03/27/2024 1439   PROBNP 191 09/22/2022 1622   PROBNP 64.6 09/15/2012 2345    Specialty Problems       Pulmonary Problems   Obstructive sleep apnea   Asthma exacerbation    Allergies  Allergen Reactions   Iohexol  Rash and Other (See Comments)    Patient stated a rash appeared after a CT scan    Hydrochlorothiazide  Other (See Comments)    12/18/21: Palpitations  and SOB per the pt - AS    Immunization History  Administered Date(s) Administered   INFLUENZA, HIGH DOSE SEASONAL PF 12/26/2014, 12/15/2015   Influenza, Quadrivalent, Recombinant, Inj, Pf 01/06/2017, 01/04/2019, 01/11/2020, 01/16/2021, 01/27/2023   Influenza,inj,Quad PF,6+ Mos 12/20/2013   Influenza-Unspecified 01/04/2012, 12/04/2016, 12/05/2018   PFIZER Comirnaty (Gray Top)Covid-19 Tri-Sucrose Vaccine 08/15/2020   PFIZER(Purple Top)SARS-COV-2 Vaccination 04/17/2019, 05/08/2019, 12/03/2019, 12/04/2019   PNEUMOCOCCAL CONJUGATE-20 05/06/2021   Pfizer Covid-19 Vaccine Bivalent Booster 65yrs & up 01/07/2021   Pfizer(Comirnaty )Fall Seasonal Vaccine 12 years and older 01/30/2024   Pneumococcal Conjugate-13 02/10/2015   Pneumococcal Polysaccharide-23 11/02/2010, 01/04/2012   Tdap 11/02/2010    Past Medical History:  Diagnosis Date   BMI 40.0-44.9, adult (HCC)    CAD in native artery, stent to RCA BMS with MI 2015 11/04/2003   a. inferior STEMI 01/2014 s/p BMS to prox RCA.   DVT (deep venous thrombosis) (HCC) 2003   lower extremity DVT with questionable recurrence in 2005. Another admission 2015.   Endometrial cancer (HCC) 2021   Endometrial thickening on ultra sound 07/2009   path showing Fragments of benign endocervical and squamous mucosa. No dysplasia or malignancy // Followed by Dr. Peggye Gull   First degree AV block    Gastric ulcer    GERD (gastroesophageal reflux disease)    GI bleed    Hemorrhoid    Hyperlipidemia LDL goal <70    Hypertension    Insomnia 11/02/2010    Memory difficulties 11/02/2010   Mild dilation of ascending aorta    Morbid obesity (HCC) 09/16/2012   Obstructive sleep apnea 09/23/2010   Pre-diabetes    Pulmonary nodule    ST elevation myocardial infarction (STEMI) of inferior wall (HCC) 01/24/2014   Stroke (HCC) 2015   Syncope 12/18/2013   Syncope, near 12/14/2018   TIA (transient ischemic attack) 09/23/2010   09/13/2010: CT, MRI/MRA no acute process. Carotid doppler: no stenosis. 2D-Echo Normal LV function with an EF 55-60%.    Trifascicular block    UTI (urinary tract infection) 01/27/2014    Tobacco History: Social History   Tobacco Use  Smoking Status Never  Smokeless Tobacco Never   Counseling given: Not Answered   Continue to not smoke  Outpatient Encounter Medications as of 04/10/2024  Medication Sig   acetaminophen  (TYLENOL ) 500 MG tablet Take 500 mg by mouth 2 (two) times daily as needed (for pain).   albuterol  (PROVENTIL ) (2.5 MG/3ML) 0.083% nebulizer solution Take 3 mLs (2.5 mg total) by nebulization every 4 (four) hours as needed for wheezing or shortness of breath.   albuterol  (VENTOLIN  HFA) 108 (90 Base) MCG/ACT inhaler INHALE 2 PUFFS INTO THE LUNGS EVERY 4 HOURS AS NEEDED FOR WHEEZING OR SHORTNESS OF BREATH.   amLODipine  (NORVASC ) 10 MG tablet Take 1 tablet (10 mg total) by mouth daily.   arformoterol  (BROVANA ) 15 MCG/2ML NEBU Take 2 mLs (15 mcg total) by nebulization 2 (two) times daily.   atorvastatin  (LIPITOR ) 80 MG tablet TAKE 1 TABLET BY MOUTH EVERY DAY   budesonide  (PULMICORT ) 0.5 MG/2ML nebulizer solution Take 2 mLs (0.5 mg total) by nebulization 2 (two) times daily.   busPIRone  (BUSPAR ) 7.5 MG tablet Take 7.5 mg by mouth 2 (two) times daily.   cetirizine (ZYRTEC) 10 MG tablet Take 10 mg by mouth daily.   cholecalciferol (VITAMIN D3) 25 MCG (1000 UNIT) tablet Take 1,000 Units by mouth daily.   ELIQUIS  2.5 MG TABS tablet TAKE 1 TABLET BY MOUTH TWICE A DAY   fluticasone  furoate-vilanterol (BREO ELLIPTA )  200-25 MCG/ACT AEPB Inhale 1 puff into the lungs daily.   furosemide  (LASIX ) 40 MG tablet Take 1 tablet (40 mg total) by mouth 2 (two) times daily.   hydrALAZINE  (APRESOLINE ) 25 MG tablet Take 1 tablet (25 mg total) by mouth every 6 (six) hours. (Patient taking differently: Take 25 mg by mouth as needed (for BP >140/85).)   metFORMIN (GLUCOPHAGE-XR) 500 MG 24 hr tablet Take 500 mg by mouth daily with breakfast.   methylPREDNISolone  (MEDROL  DOSEPAK) 4 MG TBPK tablet Take as prescribed on the box   metoprolol  succinate (TOPROL  XL) 50 MG 24 hr tablet Take 1 tablet (50 mg total) by mouth daily. Take with or immediately following a meal.   Multiple Vitamin (MULTIVITAMIN WITH MINERALS) TABS Take 1 tablet by mouth every morning.   Naphazoline HCl (CLEAR EYES OP) Place 1 drop into both eyes as needed (for irritation or dryness).    nitroGLYCERIN  (NITROSTAT ) 0.4 MG SL tablet Place 1 tablet (0.4 mg total) under the tongue every 5 (five) minutes x 3 doses as needed for chest pain.   omeprazole  (PRILOSEC) 20 MG capsule Take 1 capsule (20 mg total) by mouth every other day. (Patient taking differently: Take 20 mg by mouth daily.)   oseltamivir  (TAMIFLU ) 75 MG capsule Take 1 capsule (75 mg total) by mouth every 12 (twelve) hours.   potassium chloride  SA (KLOR-CON  M) 20 MEQ tablet Take 1 tablet (20 mEq total) by mouth daily.   sodium chloride  (OCEAN) 0.65 % SOLN nasal spray Place 1 spray into both nostrils as needed for congestion.   Spacer/Aero-Holding Chambers (AEROCHAMBER MV) inhaler Use as instructed   telmisartan (MICARDIS) 80 MG tablet Take 80 mg by mouth daily.   No facility-administered encounter medications on file as of 04/10/2024.     Review of Systems  Review of Systems  N/a Physical Exam  BP (!) 142/83 (BP Location: Left Arm, Patient Position: Sitting, Cuff Size: Normal)   Pulse 72   Temp 98 F (36.7 C) (Oral)   Resp 18   Ht 5' 4 (1.626 m)   Wt 246 lb (111.6 kg)   SpO2 97%   BMI 42.23  kg/m   Wt Readings from Last 5 Encounters:  04/10/24 246 lb (111.6 kg)  12/14/23 243 lb (110.2 kg)  08/18/23 244 lb 12.8 oz (111 kg)  08/01/23 244 lb (110.7 kg)  07/22/23 236 lb 5.3 oz (107.2 kg)    BMI Readings from Last 5 Encounters:  04/10/24 42.23 kg/m  12/14/23 43.05 kg/m  08/18/23 43.36 kg/m  08/01/23 43.22 kg/m  07/22/23 41.86 kg/m     Physical Exam General: Well-appearing, in no distress Eyes: No icterus Neck: No JVP Pulmonary: Clear bilaterally, normal work of breathing Cardiovascular: Warm, regular rate and rhythm Abdomen: Nondistended   Assessment & Plan:   Dyspnea on exertion: Overall improved with asthma therapies.  Escalated as below given cough and wheeze.  PFTs performed 03/2022 are normal.  Cough, wheeze due to asthma with recent exacerbation: Suspect related to asthma, triggered after viral illness 05/2023.  Prolonged exacerbation culminating in hospitalization 07/2023.  Mild improvement ICS/LABA but then worsened prompting hospitalization 07/2023.  Overall improved with nebulized ICS LABA therapy as well as Breo.  Albuterol , use as needed.  Reiterated  Influenza infection: Shortness of breath wheeze congestion.  Improved with Tamiflu  and prednisone  taper.  Anticipated additional improvement in the coming days to weeks.  No further treatment at this time.  Return in about 3 months (around 07/09/2024) for f/u  Dr. Annella.   Shirley JONELLE Annella, MD 04/10/2024   "

## 2024-04-12 ENCOUNTER — Telehealth: Payer: Self-pay

## 2024-04-12 NOTE — Telephone Encounter (Signed)
 Copied from CRM #[id number]. Topic: Clinical - Order For Equipment >> Apr 10, 2024  9:33 AM Ismael A wrote: Reason for CRM: [facility]on from [facility] dme - returning call from [place], I advised it was regarding CPAP compliance report - Delon stated they have not set up pt for CPAP, pt never showed up for her appt. Scheduled with them for Sept.    Noted.

## 2024-07-24 ENCOUNTER — Ambulatory Visit: Admitting: Pulmonary Disease
